# Patient Record
Sex: Female | Born: 1950 | Race: White | Hispanic: No | Smoking: Current every day smoker
Health system: Southern US, Community
[De-identification: ages and names within clinical notes are randomized; demographics above are authoritative.]

## PROBLEM LIST (undated history)

## (undated) DIAGNOSIS — Z8541 Personal history of malignant neoplasm of cervix uteri: Secondary | ICD-10-CM

## (undated) DIAGNOSIS — E785 Hyperlipidemia, unspecified: Secondary | ICD-10-CM

## (undated) DIAGNOSIS — F329 Major depressive disorder, single episode, unspecified: Secondary | ICD-10-CM

## (undated) DIAGNOSIS — I350 Nonrheumatic aortic (valve) stenosis: Secondary | ICD-10-CM

## (undated) DIAGNOSIS — M1990 Unspecified osteoarthritis, unspecified site: Secondary | ICD-10-CM

## (undated) DIAGNOSIS — I4891 Unspecified atrial fibrillation: Secondary | ICD-10-CM

## (undated) DIAGNOSIS — J439 Emphysema, unspecified: Secondary | ICD-10-CM

## (undated) DIAGNOSIS — E039 Hypothyroidism, unspecified: Secondary | ICD-10-CM

## (undated) DIAGNOSIS — I10 Essential (primary) hypertension: Secondary | ICD-10-CM

## (undated) DIAGNOSIS — Z720 Tobacco use: Secondary | ICD-10-CM

## (undated) DIAGNOSIS — E079 Disorder of thyroid, unspecified: Secondary | ICD-10-CM

## (undated) DIAGNOSIS — F1910 Other psychoactive substance abuse, uncomplicated: Secondary | ICD-10-CM

## (undated) DIAGNOSIS — I639 Cerebral infarction, unspecified: Secondary | ICD-10-CM

## (undated) DIAGNOSIS — R011 Cardiac murmur, unspecified: Secondary | ICD-10-CM

## (undated) DIAGNOSIS — F32A Depression, unspecified: Secondary | ICD-10-CM

## (undated) DIAGNOSIS — H269 Unspecified cataract: Secondary | ICD-10-CM

## (undated) DIAGNOSIS — T7840XA Allergy, unspecified, initial encounter: Secondary | ICD-10-CM

## (undated) DIAGNOSIS — F419 Anxiety disorder, unspecified: Secondary | ICD-10-CM

## (undated) DIAGNOSIS — J449 Chronic obstructive pulmonary disease, unspecified: Secondary | ICD-10-CM

## (undated) DIAGNOSIS — R569 Unspecified convulsions: Secondary | ICD-10-CM

## (undated) HISTORY — DX: Unspecified convulsions: R56.9

## (undated) HISTORY — DX: Essential (primary) hypertension: I10

## (undated) HISTORY — DX: Nonrheumatic aortic (valve) stenosis: I35.0

## (undated) HISTORY — DX: Major depressive disorder, single episode, unspecified: F32.9

## (undated) HISTORY — PX: CARDIAC VALVE REPLACEMENT: SHX585

## (undated) HISTORY — PX: ABDOMINAL HYSTERECTOMY: SHX81

## (undated) HISTORY — DX: Cerebral infarction, unspecified: I63.9

## (undated) HISTORY — DX: Depression, unspecified: F32.A

## (undated) HISTORY — DX: Emphysema, unspecified: J43.9

## (undated) HISTORY — DX: Cardiac murmur, unspecified: R01.1

## (undated) HISTORY — DX: Tobacco use: Z72.0

## (undated) HISTORY — DX: Unspecified osteoarthritis, unspecified site: M19.90

## (undated) HISTORY — PX: TONSILLECTOMY: SUR1361

## (undated) HISTORY — DX: Allergy, unspecified, initial encounter: T78.40XA

## (undated) HISTORY — PX: BREAST ENHANCEMENT SURGERY: SHX7

## (undated) HISTORY — PX: AORTIC VALVE REPLACEMENT: SHX41

## (undated) HISTORY — PX: VAGINAL HYSTERECTOMY: SUR661

## (undated) HISTORY — DX: Hyperlipidemia, unspecified: E78.5

## (undated) HISTORY — DX: Unspecified cataract: H26.9

## (undated) HISTORY — DX: Other psychoactive substance abuse, uncomplicated: F19.10

## (undated) HISTORY — PX: APPENDECTOMY: SHX54

## (undated) HISTORY — DX: Disorder of thyroid, unspecified: E07.9

## (undated) HISTORY — PX: BREAST SURGERY: SHX581

---

## 1977-08-29 DIAGNOSIS — C801 Malignant (primary) neoplasm, unspecified: Secondary | ICD-10-CM

## 1977-08-29 HISTORY — DX: Malignant (primary) neoplasm, unspecified: C80.1

## 2004-09-21 ENCOUNTER — Ambulatory Visit: Payer: Self-pay | Admitting: Family Medicine

## 2004-09-23 ENCOUNTER — Other Ambulatory Visit: Admission: RE | Admit: 2004-09-23 | Discharge: 2004-09-23 | Payer: Self-pay | Admitting: Dermatology

## 2004-10-28 ENCOUNTER — Ambulatory Visit: Payer: Self-pay | Admitting: Family Medicine

## 2005-02-01 ENCOUNTER — Ambulatory Visit: Payer: Self-pay | Admitting: Family Medicine

## 2006-01-30 ENCOUNTER — Ambulatory Visit (HOSPITAL_COMMUNITY): Admission: RE | Admit: 2006-01-30 | Discharge: 2006-01-30 | Payer: Self-pay | Admitting: Emergency Medicine

## 2007-03-27 ENCOUNTER — Ambulatory Visit (HOSPITAL_COMMUNITY): Admission: RE | Admit: 2007-03-27 | Discharge: 2007-03-27 | Payer: Self-pay | Admitting: Internal Medicine

## 2010-09-19 ENCOUNTER — Encounter: Payer: Self-pay | Admitting: Internal Medicine

## 2012-05-03 ENCOUNTER — Other Ambulatory Visit (HOSPITAL_COMMUNITY): Payer: Self-pay | Admitting: Nurse Practitioner

## 2012-05-03 DIAGNOSIS — Z139 Encounter for screening, unspecified: Secondary | ICD-10-CM

## 2012-05-14 ENCOUNTER — Ambulatory Visit (HOSPITAL_COMMUNITY)
Admission: RE | Admit: 2012-05-14 | Discharge: 2012-05-14 | Disposition: A | Discharge status: Home / Self Care | Payer: PRIVATE HEALTH INSURANCE | Source: Ambulatory Visit | Attending: Nurse Practitioner | Admitting: Nurse Practitioner

## 2012-05-14 DIAGNOSIS — Z139 Encounter for screening, unspecified: Secondary | ICD-10-CM

## 2012-05-14 DIAGNOSIS — Z1231 Encounter for screening mammogram for malignant neoplasm of breast: Secondary | ICD-10-CM | POA: Insufficient documentation

## 2014-02-19 DIAGNOSIS — E785 Hyperlipidemia, unspecified: Secondary | ICD-10-CM | POA: Diagnosis not present

## 2014-02-19 DIAGNOSIS — E039 Hypothyroidism, unspecified: Secondary | ICD-10-CM | POA: Diagnosis not present

## 2014-02-19 DIAGNOSIS — I10 Essential (primary) hypertension: Secondary | ICD-10-CM | POA: Diagnosis not present

## 2014-06-13 DIAGNOSIS — Z23 Encounter for immunization: Secondary | ICD-10-CM | POA: Diagnosis not present

## 2014-06-25 DIAGNOSIS — F419 Anxiety disorder, unspecified: Secondary | ICD-10-CM | POA: Diagnosis not present

## 2014-08-06 DIAGNOSIS — F341 Dysthymic disorder: Secondary | ICD-10-CM | POA: Diagnosis not present

## 2014-08-06 DIAGNOSIS — M9903 Segmental and somatic dysfunction of lumbar region: Secondary | ICD-10-CM | POA: Diagnosis not present

## 2014-08-06 DIAGNOSIS — M9905 Segmental and somatic dysfunction of pelvic region: Secondary | ICD-10-CM | POA: Diagnosis not present

## 2014-08-06 DIAGNOSIS — M9904 Segmental and somatic dysfunction of sacral region: Secondary | ICD-10-CM | POA: Diagnosis not present

## 2014-08-06 DIAGNOSIS — F630 Pathological gambling: Secondary | ICD-10-CM | POA: Diagnosis not present

## 2014-08-06 DIAGNOSIS — F411 Generalized anxiety disorder: Secondary | ICD-10-CM | POA: Diagnosis not present

## 2014-08-06 DIAGNOSIS — M5136 Other intervertebral disc degeneration, lumbar region: Secondary | ICD-10-CM | POA: Diagnosis not present

## 2014-08-15 DIAGNOSIS — F411 Generalized anxiety disorder: Secondary | ICD-10-CM | POA: Diagnosis not present

## 2014-08-15 DIAGNOSIS — F341 Dysthymic disorder: Secondary | ICD-10-CM | POA: Diagnosis not present

## 2014-08-15 DIAGNOSIS — F630 Pathological gambling: Secondary | ICD-10-CM | POA: Diagnosis not present

## 2014-08-20 DIAGNOSIS — F411 Generalized anxiety disorder: Secondary | ICD-10-CM | POA: Diagnosis not present

## 2014-08-20 DIAGNOSIS — F630 Pathological gambling: Secondary | ICD-10-CM | POA: Diagnosis not present

## 2014-08-20 DIAGNOSIS — F341 Dysthymic disorder: Secondary | ICD-10-CM | POA: Diagnosis not present

## 2014-09-01 DIAGNOSIS — M9904 Segmental and somatic dysfunction of sacral region: Secondary | ICD-10-CM | POA: Diagnosis not present

## 2014-09-01 DIAGNOSIS — F411 Generalized anxiety disorder: Secondary | ICD-10-CM | POA: Diagnosis not present

## 2014-09-01 DIAGNOSIS — F630 Pathological gambling: Secondary | ICD-10-CM | POA: Diagnosis not present

## 2014-09-01 DIAGNOSIS — M9903 Segmental and somatic dysfunction of lumbar region: Secondary | ICD-10-CM | POA: Diagnosis not present

## 2014-09-01 DIAGNOSIS — F341 Dysthymic disorder: Secondary | ICD-10-CM | POA: Diagnosis not present

## 2014-09-01 DIAGNOSIS — M9905 Segmental and somatic dysfunction of pelvic region: Secondary | ICD-10-CM | POA: Diagnosis not present

## 2014-09-01 DIAGNOSIS — M5136 Other intervertebral disc degeneration, lumbar region: Secondary | ICD-10-CM | POA: Diagnosis not present

## 2014-09-09 DIAGNOSIS — F630 Pathological gambling: Secondary | ICD-10-CM | POA: Diagnosis not present

## 2014-09-09 DIAGNOSIS — F341 Dysthymic disorder: Secondary | ICD-10-CM | POA: Diagnosis not present

## 2014-09-09 DIAGNOSIS — F411 Generalized anxiety disorder: Secondary | ICD-10-CM | POA: Diagnosis not present

## 2014-09-23 DIAGNOSIS — F630 Pathological gambling: Secondary | ICD-10-CM | POA: Diagnosis not present

## 2014-09-23 DIAGNOSIS — F411 Generalized anxiety disorder: Secondary | ICD-10-CM | POA: Diagnosis not present

## 2014-09-23 DIAGNOSIS — F341 Dysthymic disorder: Secondary | ICD-10-CM | POA: Diagnosis not present

## 2014-10-09 DIAGNOSIS — M9905 Segmental and somatic dysfunction of pelvic region: Secondary | ICD-10-CM | POA: Diagnosis not present

## 2014-10-09 DIAGNOSIS — M5136 Other intervertebral disc degeneration, lumbar region: Secondary | ICD-10-CM | POA: Diagnosis not present

## 2014-10-09 DIAGNOSIS — M9904 Segmental and somatic dysfunction of sacral region: Secondary | ICD-10-CM | POA: Diagnosis not present

## 2014-10-09 DIAGNOSIS — M9903 Segmental and somatic dysfunction of lumbar region: Secondary | ICD-10-CM | POA: Diagnosis not present

## 2014-10-21 DIAGNOSIS — F630 Pathological gambling: Secondary | ICD-10-CM | POA: Diagnosis not present

## 2014-10-21 DIAGNOSIS — F341 Dysthymic disorder: Secondary | ICD-10-CM | POA: Diagnosis not present

## 2014-10-21 DIAGNOSIS — F411 Generalized anxiety disorder: Secondary | ICD-10-CM | POA: Diagnosis not present

## 2014-12-10 DIAGNOSIS — F17200 Nicotine dependence, unspecified, uncomplicated: Secondary | ICD-10-CM | POA: Diagnosis not present

## 2014-12-10 DIAGNOSIS — E039 Hypothyroidism, unspecified: Secondary | ICD-10-CM | POA: Diagnosis not present

## 2014-12-10 DIAGNOSIS — Z049 Encounter for examination and observation for unspecified reason: Secondary | ICD-10-CM | POA: Diagnosis not present

## 2014-12-10 DIAGNOSIS — I10 Essential (primary) hypertension: Secondary | ICD-10-CM | POA: Diagnosis not present

## 2014-12-10 DIAGNOSIS — E785 Hyperlipidemia, unspecified: Secondary | ICD-10-CM | POA: Diagnosis not present

## 2015-01-19 DIAGNOSIS — I973 Postprocedural hypertension: Secondary | ICD-10-CM | POA: Diagnosis not present

## 2015-01-19 DIAGNOSIS — M5137 Other intervertebral disc degeneration, lumbosacral region: Secondary | ICD-10-CM | POA: Diagnosis not present

## 2015-01-19 DIAGNOSIS — M9902 Segmental and somatic dysfunction of thoracic region: Secondary | ICD-10-CM | POA: Diagnosis not present

## 2015-01-19 DIAGNOSIS — M9901 Segmental and somatic dysfunction of cervical region: Secondary | ICD-10-CM | POA: Diagnosis not present

## 2015-01-19 DIAGNOSIS — M9903 Segmental and somatic dysfunction of lumbar region: Secondary | ICD-10-CM | POA: Diagnosis not present

## 2015-01-19 DIAGNOSIS — Z87891 Personal history of nicotine dependence: Secondary | ICD-10-CM | POA: Diagnosis not present

## 2015-01-21 DIAGNOSIS — M5137 Other intervertebral disc degeneration, lumbosacral region: Secondary | ICD-10-CM | POA: Diagnosis not present

## 2015-01-21 DIAGNOSIS — M9902 Segmental and somatic dysfunction of thoracic region: Secondary | ICD-10-CM | POA: Diagnosis not present

## 2015-01-21 DIAGNOSIS — M9901 Segmental and somatic dysfunction of cervical region: Secondary | ICD-10-CM | POA: Diagnosis not present

## 2015-01-21 DIAGNOSIS — Z87891 Personal history of nicotine dependence: Secondary | ICD-10-CM | POA: Diagnosis not present

## 2015-01-21 DIAGNOSIS — M9903 Segmental and somatic dysfunction of lumbar region: Secondary | ICD-10-CM | POA: Diagnosis not present

## 2015-01-21 DIAGNOSIS — I973 Postprocedural hypertension: Secondary | ICD-10-CM | POA: Diagnosis not present

## 2015-01-28 DIAGNOSIS — M9902 Segmental and somatic dysfunction of thoracic region: Secondary | ICD-10-CM | POA: Diagnosis not present

## 2015-01-28 DIAGNOSIS — M9901 Segmental and somatic dysfunction of cervical region: Secondary | ICD-10-CM | POA: Diagnosis not present

## 2015-01-28 DIAGNOSIS — M5137 Other intervertebral disc degeneration, lumbosacral region: Secondary | ICD-10-CM | POA: Diagnosis not present

## 2015-01-28 DIAGNOSIS — Z87891 Personal history of nicotine dependence: Secondary | ICD-10-CM | POA: Diagnosis not present

## 2015-01-28 DIAGNOSIS — M9903 Segmental and somatic dysfunction of lumbar region: Secondary | ICD-10-CM | POA: Diagnosis not present

## 2015-01-28 DIAGNOSIS — I973 Postprocedural hypertension: Secondary | ICD-10-CM | POA: Diagnosis not present

## 2015-02-04 DIAGNOSIS — M5137 Other intervertebral disc degeneration, lumbosacral region: Secondary | ICD-10-CM | POA: Diagnosis not present

## 2015-02-04 DIAGNOSIS — M9901 Segmental and somatic dysfunction of cervical region: Secondary | ICD-10-CM | POA: Diagnosis not present

## 2015-02-04 DIAGNOSIS — I973 Postprocedural hypertension: Secondary | ICD-10-CM | POA: Diagnosis not present

## 2015-02-04 DIAGNOSIS — Z87891 Personal history of nicotine dependence: Secondary | ICD-10-CM | POA: Diagnosis not present

## 2015-02-04 DIAGNOSIS — M9903 Segmental and somatic dysfunction of lumbar region: Secondary | ICD-10-CM | POA: Diagnosis not present

## 2015-02-04 DIAGNOSIS — M9902 Segmental and somatic dysfunction of thoracic region: Secondary | ICD-10-CM | POA: Diagnosis not present

## 2015-03-25 DIAGNOSIS — I973 Postprocedural hypertension: Secondary | ICD-10-CM | POA: Diagnosis not present

## 2015-03-25 DIAGNOSIS — M5137 Other intervertebral disc degeneration, lumbosacral region: Secondary | ICD-10-CM | POA: Diagnosis not present

## 2015-03-25 DIAGNOSIS — M9903 Segmental and somatic dysfunction of lumbar region: Secondary | ICD-10-CM | POA: Diagnosis not present

## 2015-03-25 DIAGNOSIS — Z87891 Personal history of nicotine dependence: Secondary | ICD-10-CM | POA: Diagnosis not present

## 2015-03-25 DIAGNOSIS — M9901 Segmental and somatic dysfunction of cervical region: Secondary | ICD-10-CM | POA: Diagnosis not present

## 2015-03-25 DIAGNOSIS — M9902 Segmental and somatic dysfunction of thoracic region: Secondary | ICD-10-CM | POA: Diagnosis not present

## 2015-04-23 DIAGNOSIS — H40033 Anatomical narrow angle, bilateral: Secondary | ICD-10-CM | POA: Diagnosis not present

## 2015-04-23 DIAGNOSIS — H2513 Age-related nuclear cataract, bilateral: Secondary | ICD-10-CM | POA: Diagnosis not present

## 2015-06-10 DIAGNOSIS — Z87891 Personal history of nicotine dependence: Secondary | ICD-10-CM | POA: Diagnosis not present

## 2015-06-10 DIAGNOSIS — M9904 Segmental and somatic dysfunction of sacral region: Secondary | ICD-10-CM | POA: Diagnosis not present

## 2015-06-10 DIAGNOSIS — M5430 Sciatica, unspecified side: Secondary | ICD-10-CM | POA: Diagnosis not present

## 2015-06-10 DIAGNOSIS — M9903 Segmental and somatic dysfunction of lumbar region: Secondary | ICD-10-CM | POA: Diagnosis not present

## 2015-06-10 DIAGNOSIS — M9902 Segmental and somatic dysfunction of thoracic region: Secondary | ICD-10-CM | POA: Diagnosis not present

## 2015-06-10 DIAGNOSIS — I973 Postprocedural hypertension: Secondary | ICD-10-CM | POA: Diagnosis not present

## 2015-06-11 DIAGNOSIS — M5430 Sciatica, unspecified side: Secondary | ICD-10-CM | POA: Diagnosis not present

## 2015-06-11 DIAGNOSIS — M9904 Segmental and somatic dysfunction of sacral region: Secondary | ICD-10-CM | POA: Diagnosis not present

## 2015-06-11 DIAGNOSIS — M9902 Segmental and somatic dysfunction of thoracic region: Secondary | ICD-10-CM | POA: Diagnosis not present

## 2015-06-11 DIAGNOSIS — Z87891 Personal history of nicotine dependence: Secondary | ICD-10-CM | POA: Diagnosis not present

## 2015-06-11 DIAGNOSIS — M9903 Segmental and somatic dysfunction of lumbar region: Secondary | ICD-10-CM | POA: Diagnosis not present

## 2015-06-11 DIAGNOSIS — I973 Postprocedural hypertension: Secondary | ICD-10-CM | POA: Diagnosis not present

## 2015-06-17 DIAGNOSIS — Z23 Encounter for immunization: Secondary | ICD-10-CM | POA: Diagnosis not present

## 2015-06-22 DIAGNOSIS — M9903 Segmental and somatic dysfunction of lumbar region: Secondary | ICD-10-CM | POA: Diagnosis not present

## 2015-06-22 DIAGNOSIS — I973 Postprocedural hypertension: Secondary | ICD-10-CM | POA: Diagnosis not present

## 2015-06-22 DIAGNOSIS — M9902 Segmental and somatic dysfunction of thoracic region: Secondary | ICD-10-CM | POA: Diagnosis not present

## 2015-06-22 DIAGNOSIS — M9904 Segmental and somatic dysfunction of sacral region: Secondary | ICD-10-CM | POA: Diagnosis not present

## 2015-06-22 DIAGNOSIS — M5430 Sciatica, unspecified side: Secondary | ICD-10-CM | POA: Diagnosis not present

## 2015-06-22 DIAGNOSIS — Z87891 Personal history of nicotine dependence: Secondary | ICD-10-CM | POA: Diagnosis not present

## 2015-07-08 DIAGNOSIS — I973 Postprocedural hypertension: Secondary | ICD-10-CM | POA: Diagnosis not present

## 2015-07-08 DIAGNOSIS — M9902 Segmental and somatic dysfunction of thoracic region: Secondary | ICD-10-CM | POA: Diagnosis not present

## 2015-07-08 DIAGNOSIS — M9903 Segmental and somatic dysfunction of lumbar region: Secondary | ICD-10-CM | POA: Diagnosis not present

## 2015-07-08 DIAGNOSIS — Z87891 Personal history of nicotine dependence: Secondary | ICD-10-CM | POA: Diagnosis not present

## 2015-07-08 DIAGNOSIS — M9904 Segmental and somatic dysfunction of sacral region: Secondary | ICD-10-CM | POA: Diagnosis not present

## 2015-07-08 DIAGNOSIS — M5430 Sciatica, unspecified side: Secondary | ICD-10-CM | POA: Diagnosis not present

## 2015-08-10 DIAGNOSIS — Z1283 Encounter for screening for malignant neoplasm of skin: Secondary | ICD-10-CM | POA: Diagnosis not present

## 2015-08-19 DIAGNOSIS — M5430 Sciatica, unspecified side: Secondary | ICD-10-CM | POA: Diagnosis not present

## 2015-08-19 DIAGNOSIS — Z87891 Personal history of nicotine dependence: Secondary | ICD-10-CM | POA: Diagnosis not present

## 2015-08-19 DIAGNOSIS — M9902 Segmental and somatic dysfunction of thoracic region: Secondary | ICD-10-CM | POA: Diagnosis not present

## 2015-08-19 DIAGNOSIS — I973 Postprocedural hypertension: Secondary | ICD-10-CM | POA: Diagnosis not present

## 2015-08-19 DIAGNOSIS — M9904 Segmental and somatic dysfunction of sacral region: Secondary | ICD-10-CM | POA: Diagnosis not present

## 2015-08-19 DIAGNOSIS — M9903 Segmental and somatic dysfunction of lumbar region: Secondary | ICD-10-CM | POA: Diagnosis not present

## 2015-10-26 DIAGNOSIS — I973 Postprocedural hypertension: Secondary | ICD-10-CM | POA: Diagnosis not present

## 2015-10-26 DIAGNOSIS — M5137 Other intervertebral disc degeneration, lumbosacral region: Secondary | ICD-10-CM | POA: Diagnosis not present

## 2015-10-26 DIAGNOSIS — M9902 Segmental and somatic dysfunction of thoracic region: Secondary | ICD-10-CM | POA: Diagnosis not present

## 2015-10-26 DIAGNOSIS — Z87891 Personal history of nicotine dependence: Secondary | ICD-10-CM | POA: Diagnosis not present

## 2015-10-26 DIAGNOSIS — M9903 Segmental and somatic dysfunction of lumbar region: Secondary | ICD-10-CM | POA: Diagnosis not present

## 2015-10-26 DIAGNOSIS — M9901 Segmental and somatic dysfunction of cervical region: Secondary | ICD-10-CM | POA: Diagnosis not present

## 2015-11-20 ENCOUNTER — Encounter: Payer: Self-pay | Admitting: Family Medicine

## 2015-11-20 ENCOUNTER — Ambulatory Visit (INDEPENDENT_AMBULATORY_CARE_PROVIDER_SITE_OTHER): Payer: Medicare Other | Admitting: Family Medicine

## 2015-11-20 VITALS — BP 110/72 | HR 53 | Temp 97.1°F | Ht 63.5 in | Wt 128.0 lb

## 2015-11-20 DIAGNOSIS — E785 Hyperlipidemia, unspecified: Secondary | ICD-10-CM | POA: Insufficient documentation

## 2015-11-20 DIAGNOSIS — E039 Hypothyroidism, unspecified: Secondary | ICD-10-CM | POA: Diagnosis not present

## 2015-11-20 DIAGNOSIS — I10 Essential (primary) hypertension: Secondary | ICD-10-CM

## 2015-11-20 DIAGNOSIS — F411 Generalized anxiety disorder: Secondary | ICD-10-CM | POA: Diagnosis not present

## 2015-11-20 DIAGNOSIS — R011 Cardiac murmur, unspecified: Secondary | ICD-10-CM | POA: Insufficient documentation

## 2015-11-20 MED ORDER — BISOPROLOL-HYDROCHLOROTHIAZIDE 10-6.25 MG PO TABS: 1.0000 | ORAL_TABLET | Freq: Every day | ORAL | Status: DC

## 2015-11-20 MED ORDER — LEVOTHYROXINE SODIUM 75 MCG PO TABS: 75.0000 ug | ORAL_TABLET | Freq: Every day | ORAL | Status: DC

## 2015-11-20 MED ORDER — LOVASTATIN 20 MG PO TABS: 20.0000 mg | ORAL_TABLET | Freq: Every day | ORAL | Status: DC

## 2015-11-20 MED ORDER — ESCITALOPRAM OXALATE 10 MG PO TABS: 10.0000 mg | ORAL_TABLET | Freq: Every day | ORAL | Status: DC

## 2015-11-20 NOTE — Progress Notes (Signed)
BP 110/72 mmHg  Pulse 53  Temp(Src) 97.1 F (36.2 C) (Oral)  Ht 5' 3.5" (1.613 m)  Wt 128 lb (58.06 kg)  BMI 22.32 kg/m2   Subjective:    Patient ID: Tammie Martin, female    DOB: [birth year], 65 y.o.   MRN: [id number]  HPI: Tammie Martin is a 65 y.o. female presenting on 11/20/2015 for Establish Care   HPI Hypertension Patient is coming to establish with our office as a new patient. She is coming in today for hypertension check. She is currently on Ziac and her blood pressure today is 110/72.Patient denies headaches, blurred vision, chest pains, shortness of breath, or weakness. Denies any side effects from medication and is content with current medication.   Hyperlipidemia  Patient is currently on Mevacor and fish oils for cholesterol. She is due for a check today and came in fasting. She denies any issues with her medication.  Hypothyroidism Patient is on Synthroid 75 g. She denies any issues with the medication. She denies any fatigue or hair changes or weight changes or constipation or diarrhea.She denies any palpitations or chest pain. Has been a while since she's had a check.  Anxiety Patient has been having anxiety and some panic attacks occasionally that are minor panic attacks per her. She denies any depressions or feelings of sadness. She has been having some sleep issues with her anxiety waking up at night. She would like to discuss things that could per with anxiety. She does not have a counselor currently and discussed that as a possibility. She denies any suicidal ideations or thoughts of hurting Herself.  Relevant past medical, surgical, family and social history reviewed and updated as indicated. Interim medical history since our last visit reviewed. Allergies and medications reviewed and updated.  Review of Systems  Constitutional: Negative for fever and chills.  HENT: Negative for congestion, ear discharge and ear pain.   Eyes: Negative for redness and  visual disturbance.  Respiratory: Negative for chest tightness and shortness of breath.   Cardiovascular: Negative for chest pain and leg swelling.  Genitourinary: Negative for dysuria and difficulty urinating.  Musculoskeletal: Negative for back pain and gait problem.  Skin: Negative for rash.  Neurological: Negative for dizziness, light-headedness and headaches.  Psychiatric/Behavioral: Positive for sleep disturbance. Negative for suicidal ideas, behavioral problems, self-injury, dysphoric mood and agitation. The patient is nervous/anxious.   All other systems reviewed and are negative.   Per HPI unless specifically indicated above  Social History   Social History  . Marital Status: Widowed    Spouse Name: N/A  . Number of Children: N/A  . Years of Education: N/A   Occupational History  . Not on file.   Social History Main Topics  . Smoking status: Current Every Day Smoker -- 1.00 packs/day for 40 years    Types: Cigarettes  . Smokeless tobacco: Never Used  . Alcohol Use: No  . Drug Use: No  . Sexual Activity: No   Other Topics Concern  . Not on file   Social History Narrative  . No narrative on file    Past Surgical History  Procedure Laterality Date  . Abdominal hysterectomy    . Appendectomy    . Tonsillectomy    . Breast enhancement surgery      Family History  Problem Relation Age of Onset  . Cancer Mother     breast cancer at 53   . Cancer Maternal Aunt     colon cancer  Medication List       This list is accurate as of: 11/20/15 12:40 PM.  Always use your most recent med list.               bisoprolol-hydrochlorothiazide 10-6.25 MG tablet  Commonly known as:  ZIAC  Take 1 tablet by mouth daily.     calcium gluconate 500 MG tablet  Take 1 tablet by mouth 3 (three) times daily.     cholecalciferol 1000 units tablet  Commonly known as:  VITAMIN D  Take 2,000 Units by mouth 2 (two) times daily.     escitalopram 10 MG tablet    Commonly known as:  LEXAPRO  Take 1 tablet (10 mg total) by mouth daily.     Fish Oil 1000 MG Caps  Take 2 capsules by mouth daily.     Flax Seed Oil 1000 MG Caps  Take 2,000 mg by mouth daily.     Glucosamine 500 MG Caps  Take 2 capsules by mouth 2 (two) times daily.     levothyroxine 75 MCG tablet  Commonly known as:  SYNTHROID, LEVOTHROID  Take 1 tablet (75 mcg total) by mouth daily before breakfast.     lovastatin 20 MG tablet  Commonly known as:  MEVACOR  Take 1 tablet (20 mg total) by mouth at bedtime.     MULTIVITAMIN ADULT PO  Take 1 capsule by mouth daily.           Objective:    BP 110/72 mmHg  Pulse 53  Temp(Src) 97.1 F (36.2 C) (Oral)  Ht 5' 3.5" (1.613 m)  Wt 128 lb (58.06 kg)  BMI 22.32 kg/m2  Wt Readings from Last 3 Encounters:  11/20/15 128 lb (58.06 kg)    Physical Exam  Constitutional: She is oriented to person, place, and time. She appears well-developed and well-nourished. No distress.  Eyes: Conjunctivae and EOM are normal. Pupils are equal, round, and reactive to light.  Cardiovascular: Normal rate, regular rhythm and intact distal pulses.   Murmur (Systolic murmur 3 out of 6, crescendo decrescendo. Patient did not know she had this previously.) heard. Pulmonary/Chest: Effort normal and breath sounds normal. No respiratory distress. She has no wheezes.  Musculoskeletal: Normal range of motion. She exhibits no edema or tenderness.  Neurological: She is alert and oriented to person, place, and time. Coordination normal.  Skin: Skin is warm and dry. No rash noted. She is not diaphoretic.  Psychiatric: Her behavior is normal. Judgment and thought content normal. Her mood appears anxious. Her affect is not blunt and not labile. She expresses no suicidal ideation. She expresses no suicidal plans.  Nursing note and vitals reviewed.   No results found for this or any previous visit.    Assessment & Plan:   Problem List Items Addressed This  Visit      Cardiovascular and Mediastinum   Essential hypertension, benign - Primary   Relevant Medications   lovastatin (MEVACOR) 20 MG tablet   bisoprolol-hydrochlorothiazide (ZIAC) 10-6.25 MG tablet   Other Relevant Orders   CMP14+EGFR (Completed)     Endocrine   Hypothyroidism   Relevant Medications   levothyroxine (SYNTHROID, LEVOTHROID) 75 MCG tablet   Other Relevant Orders   TSH (Completed)     Other   Hyperlipidemia LDL goal <130   Relevant Medications   lovastatin (MEVACOR) 20 MG tablet   bisoprolol-hydrochlorothiazide (ZIAC) 10-6.25 MG tablet   Other Relevant Orders   Lipid panel (Completed)   Generalized anxiety disorder  Relevant Medications   escitalopram (LEXAPRO) 10 MG tablet   Systolic murmur   Relevant Orders   Echocardiogram       Follow up plan: Return in about 4 weeks (around 12/18/2015), or if symptoms worsen or fail to improve, for f/u anxiety.  Caryl Pina, MD [facility] 11/20/2015, 12:40 PM

## 2015-11-20 NOTE — Patient Instructions (Signed)
Thank you for allowing us to care for you today. We strive to provide exceptional quality and compassionate care. Please let us know how we are doing and how we can help serve you better by filling out the survey that you receive from [employer].     

## 2015-11-21 LAB — CMP14+EGFR
A/G RATIO: 1.5 (ref 1.2–2.2)
ALT: 13 IU/L (ref 0–32)
AST: 18 IU/L (ref 0–40)
Albumin: 4.3 g/dL (ref 3.6–4.8)
Alkaline Phosphatase: 90 IU/L (ref 39–117)
BILIRUBIN TOTAL: 0.3 mg/dL (ref 0.0–1.2)
BUN/Creatinine Ratio: 18 (ref 11–26)
BUN: 12 mg/dL (ref 8–27)
CALCIUM: 9.5 mg/dL (ref 8.7–10.3)
CHLORIDE: 96 mmol/L (ref 96–106)
CO2: 27 mmol/L (ref 18–29)
Creatinine, Ser: 0.68 mg/dL (ref 0.57–1.00)
GFR, EST AFRICAN AMERICAN: 105 mL/min/{1.73_m2} (ref >59)
GFR, EST NON AFRICAN AMERICAN: 91 mL/min/{1.73_m2} (ref >59)
GLOBULIN, TOTAL: 2.9 g/dL (ref 1.5–4.5)
Glucose: 91 mg/dL (ref 65–99)
POTASSIUM: 4.5 mmol/L (ref 3.5–5.2)
SODIUM: 138 mmol/L (ref 134–144)
TOTAL PROTEIN: 7.2 g/dL (ref 6.0–8.5)

## 2015-11-21 LAB — LIPID PANEL
CHOL/HDL RATIO: 3.5 ratio (ref 0.0–4.4)
Cholesterol, Total: 176 mg/dL (ref 100–199)
HDL: 51 mg/dL (ref >39)
LDL Calculated: 106 mg/dL — ABNORMAL HIGH (ref 0–99)
Triglycerides: 94 mg/dL (ref 0–149)
VLDL Cholesterol Cal: 19 mg/dL (ref 5–40)

## 2015-11-21 LAB — TSH: TSH: 2.07 u[IU]/mL (ref 0.450–4.500)

## 2015-12-02 DIAGNOSIS — M9903 Segmental and somatic dysfunction of lumbar region: Secondary | ICD-10-CM | POA: Diagnosis not present

## 2015-12-02 DIAGNOSIS — M9901 Segmental and somatic dysfunction of cervical region: Secondary | ICD-10-CM | POA: Diagnosis not present

## 2015-12-02 DIAGNOSIS — I973 Postprocedural hypertension: Secondary | ICD-10-CM | POA: Diagnosis not present

## 2015-12-02 DIAGNOSIS — Z87891 Personal history of nicotine dependence: Secondary | ICD-10-CM | POA: Diagnosis not present

## 2015-12-02 DIAGNOSIS — M5137 Other intervertebral disc degeneration, lumbosacral region: Secondary | ICD-10-CM | POA: Diagnosis not present

## 2015-12-02 DIAGNOSIS — M9902 Segmental and somatic dysfunction of thoracic region: Secondary | ICD-10-CM | POA: Diagnosis not present

## 2015-12-16 ENCOUNTER — Other Ambulatory Visit (HOSPITAL_COMMUNITY): Payer: Self-pay

## 2015-12-17 ENCOUNTER — Encounter: Payer: Self-pay | Admitting: Family Medicine

## 2015-12-17 ENCOUNTER — Ambulatory Visit (INDEPENDENT_AMBULATORY_CARE_PROVIDER_SITE_OTHER): Payer: Medicare Other | Admitting: Family Medicine

## 2015-12-17 ENCOUNTER — Ambulatory Visit (HOSPITAL_COMMUNITY)
Admission: RE | Admit: 2015-12-17 | Discharge: 2015-12-17 | Disposition: A | Discharge status: Home / Self Care | Payer: Medicare Other | Source: Ambulatory Visit | Attending: Family Medicine | Admitting: Family Medicine

## 2015-12-17 VITALS — BP 94/59 | HR 60 | Temp 97.2°F | Ht 63.5 in | Wt 127.6 lb

## 2015-12-17 DIAGNOSIS — L739 Follicular disorder, unspecified: Secondary | ICD-10-CM

## 2015-12-17 DIAGNOSIS — I119 Hypertensive heart disease without heart failure: Secondary | ICD-10-CM | POA: Diagnosis not present

## 2015-12-17 DIAGNOSIS — I340 Nonrheumatic mitral (valve) insufficiency: Secondary | ICD-10-CM | POA: Diagnosis not present

## 2015-12-17 DIAGNOSIS — E785 Hyperlipidemia, unspecified: Secondary | ICD-10-CM | POA: Diagnosis not present

## 2015-12-17 DIAGNOSIS — F411 Generalized anxiety disorder: Secondary | ICD-10-CM

## 2015-12-17 DIAGNOSIS — Z720 Tobacco use: Secondary | ICD-10-CM | POA: Diagnosis not present

## 2015-12-17 DIAGNOSIS — I352 Nonrheumatic aortic (valve) stenosis with insufficiency: Secondary | ICD-10-CM | POA: Insufficient documentation

## 2015-12-17 DIAGNOSIS — R011 Cardiac murmur, unspecified: Secondary | ICD-10-CM | POA: Insufficient documentation

## 2015-12-17 MED ORDER — ESCITALOPRAM OXALATE 10 MG PO TABS: 10.0000 mg | ORAL_TABLET | Freq: Every day | ORAL | Status: DC

## 2015-12-17 MED ORDER — DOXYCYCLINE MONOHYDRATE 100 MG PO TABS: 100.0000 mg | ORAL_TABLET | Freq: Two times a day (BID) | ORAL | Status: DC

## 2015-12-17 NOTE — Progress Notes (Signed)
BP 94/59 mmHg  Pulse 60  Temp(Src) 97.2 F (36.2 C) (Oral)  Ht 5' 3.5" (1.613 m)  Wt 127 lb 9.6 oz (57.879 kg)  BMI 22.25 kg/m2   Subjective:    Patient ID: Tammie Martin, female    DOB: [birth year], 65 y.o.   MRN: [id number]  HPI: Tammie Martin is a 65 y.o. female presenting on 12/17/2015 for Anxiety and Spots around navel   HPI Rash Patient has 2 small spots on the skin near her umbilicus that we'll get red and inflamed and look like a large pimple and then will go down. Currently they are down and urges to small pink spots. She is been having this recurrently for the past 3 or 4 months since she had a tick bite near that region. She denies any fevers or chills or shortness of breath or wheezing. There is no purulent drainage from the site. When they get bigger and inflamed they are very pruritic but not a little painful.  Anxiety recheck Patient was started on Lexapro 10 mg and feels like she is doing very well on it. She does feel like it has reduced the amount of time she wakes up with anxiety and she has been feeling a lot better about that.she has been sleeping well at night and denies any suicidal ideation or thoughts of hurting herself.  Hypertension recheck Patient is also coming in for a hypertension recheck today. She is currently on a very low-dose of her medications. She has a blood pressure today that is 94/59. Patient denies headaches, blurred vision, chest pains, shortness of breath, or weakness. Denies any side effects from medication and is content with current medication. Because she is not having any dizziness she would like to stay on this current dose of blood pressure medication.   Relevant past medical, surgical, family and social history reviewed and updated as indicated. Interim medical history since our last visit reviewed. Allergies and medications reviewed and updated.  Review of Systems  Constitutional: Negative for fever and chills.  HENT:  Negative for congestion, ear discharge and ear pain.   Eyes: Negative for redness and visual disturbance.  Respiratory: Negative for chest tightness and shortness of breath.   Cardiovascular: Negative for chest pain and leg swelling.  Genitourinary: Negative for dysuria and difficulty urinating.  Musculoskeletal: Negative for back pain and gait problem.  Skin: Negative for rash.  Neurological: Negative for dizziness, light-headedness and headaches.  Psychiatric/Behavioral: Negative for suicidal ideas, hallucinations, behavioral problems, sleep disturbance, self-injury, dysphoric mood and agitation. The patient is not nervous/anxious.   All other systems reviewed and are negative.   Per HPI unless specifically indicated above     Medication List       This list is accurate as of: 12/17/15  2:00 PM.  Always use your most recent med list.               bisoprolol-hydrochlorothiazide 10-6.25 MG tablet  Commonly known as:  ZIAC  Take 1 tablet by mouth daily.     calcium gluconate 500 MG tablet  Take 1 tablet by mouth 3 (three) times daily.     cetirizine 10 MG tablet  Commonly known as:  ZYRTEC  Take 10 mg by mouth daily.     cholecalciferol 1000 units tablet  Commonly known as:  VITAMIN D  Take 2,000 Units by mouth 2 (two) times daily.     doxycycline 100 MG tablet  Commonly known as:  ADOXA  Take 1 tablet (100 mg total) by mouth 2 (two) times daily.     escitalopram 10 MG tablet  Commonly known as:  LEXAPRO  Take 1 tablet (10 mg total) by mouth daily.     Fish Oil 1000 MG Caps  Take 2 capsules by mouth daily.     Flax Seed Oil 1000 MG Caps  Take 2,000 mg by mouth daily.     Glucosamine 500 MG Caps  Take 2 capsules by mouth 2 (two) times daily.     levothyroxine 75 MCG tablet  Commonly known as:  SYNTHROID, LEVOTHROID  Take 1 tablet (75 mcg total) by mouth daily before breakfast.     lovastatin 20 MG tablet  Commonly known as:  MEVACOR  Take 1 tablet (20 mg  total) by mouth at bedtime.     MULTIVITAMIN ADULT PO  Take 1 capsule by mouth daily.           Objective:    BP 94/59 mmHg  Pulse 60  Temp(Src) 97.2 F (36.2 C) (Oral)  Ht 5' 3.5" (1.613 m)  Wt 127 lb 9.6 oz (57.879 kg)  BMI 22.25 kg/m2  Wt Readings from Last 3 Encounters:  12/17/15 127 lb 9.6 oz (57.879 kg)  11/20/15 128 lb (58.06 kg)    Physical Exam  Constitutional: She is oriented to person, place, and time. She appears well-developed and well-nourished. No distress.  Eyes: Conjunctivae and EOM are normal. Pupils are equal, round, and reactive to light.  Neck: Neck supple. No thyromegaly present.  Cardiovascular: Normal rate, regular rhythm, normal heart sounds and intact distal pulses.   No murmur heard. Pulmonary/Chest: Effort normal and breath sounds normal. No respiratory distress. She has no wheezes.  Musculoskeletal: Normal range of motion. She exhibits no edema or tenderness.  Lymphadenopathy:    She has no cervical adenopathy.  Neurological: She is alert and oriented to person, place, and time. Coordination normal.  Skin: Skin is warm and dry. Rash (2 small pink papules on the right lateral aspect of her umbilicus,doubt about a half a centimeter from each other. No drainage or induration or fluctuation noted.) noted. She is not diaphoretic.  Psychiatric: She has a normal mood and affect. Her behavior is normal.  Nursing note and vitals reviewed.   Results for orders placed or performed in visit on 11/20/15  CMP14+EGFR  Result Value Ref Range   Glucose 91 65 - 99 mg/dL   BUN 12 8 - 27 mg/dL   Creatinine, Ser 0.68 0.57 - 1.00 mg/dL   GFR calc non Af Amer 91 >59 mL/min/1.73   GFR calc Af Amer 105 >59 mL/min/1.73   BUN/Creatinine Ratio 18 11 - 26   Sodium 138 134 - 144 mmol/L   Potassium 4.5 3.5 - 5.2 mmol/L   Chloride 96 96 - 106 mmol/L   CO2 27 18 - 29 mmol/L   Calcium 9.5 8.7 - 10.3 mg/dL   Total Protein 7.2 6.0 - 8.5 g/dL   Albumin 4.3 3.6 - 4.8 g/dL     Globulin, Total 2.9 1.5 - 4.5 g/dL   Albumin/Globulin Ratio 1.5 1.2 - 2.2   Bilirubin Total 0.3 0.0 - 1.2 mg/dL   Alkaline Phosphatase 90 39 - 117 IU/L   AST 18 0 - 40 IU/L   ALT 13 0 - 32 IU/L  Lipid panel  Result Value Ref Range   Cholesterol, Total 176 100 - 199 mg/dL   Triglycerides 94 0 - 149 mg/dL   HDL  51 >39 mg/dL   VLDL Cholesterol Cal 19 5 - 40 mg/dL   LDL Calculated 106 (H) 0 - 99 mg/dL   Chol/HDL Ratio 3.5 0.0 - 4.4 ratio units  TSH  Result Value Ref Range   TSH 2.070 0.450 - 4.500 uIU/mL      Assessment & Plan:   Problem List Items Addressed This Visit      Other   Generalized anxiety disorder - Primary   Relevant Medications   escitalopram (LEXAPRO) 10 MG tablet    Other Visit Diagnoses    Folliculitis        Relevant Medications    doxycycline (ADOXA) 100 MG tablet        Follow up plan: Return in about 3 months (around 03/17/2016), or if symptoms worsen or fail to improve, for Recheck anxiety.  Counseling provided for all of the vaccine components No orders of the defined types were placed in this encounter.    Caryl Pina, MD [facility] 12/17/2015, 2:00 PM

## 2015-12-17 NOTE — Progress Notes (Signed)
*  PRELIMINARY RESULTS* Echocardiogram 2D Echocardiogram has been performed.  Leavy Cella 12/17/2015, 11:15 AM

## 2015-12-18 ENCOUNTER — Ambulatory Visit: Payer: Medicare Other | Admitting: Family Medicine

## 2016-01-13 DIAGNOSIS — M9901 Segmental and somatic dysfunction of cervical region: Secondary | ICD-10-CM | POA: Diagnosis not present

## 2016-01-13 DIAGNOSIS — Z87891 Personal history of nicotine dependence: Secondary | ICD-10-CM | POA: Diagnosis not present

## 2016-01-13 DIAGNOSIS — M5030 Other cervical disc degeneration, unspecified cervical region: Secondary | ICD-10-CM | POA: Diagnosis not present

## 2016-01-13 DIAGNOSIS — M9902 Segmental and somatic dysfunction of thoracic region: Secondary | ICD-10-CM | POA: Diagnosis not present

## 2016-01-13 DIAGNOSIS — I973 Postprocedural hypertension: Secondary | ICD-10-CM | POA: Diagnosis not present

## 2016-01-13 DIAGNOSIS — M9905 Segmental and somatic dysfunction of pelvic region: Secondary | ICD-10-CM | POA: Diagnosis not present

## 2016-01-13 DIAGNOSIS — M9904 Segmental and somatic dysfunction of sacral region: Secondary | ICD-10-CM | POA: Diagnosis not present

## 2016-01-13 DIAGNOSIS — M9903 Segmental and somatic dysfunction of lumbar region: Secondary | ICD-10-CM | POA: Diagnosis not present

## 2016-03-03 DIAGNOSIS — M9901 Segmental and somatic dysfunction of cervical region: Secondary | ICD-10-CM | POA: Diagnosis not present

## 2016-03-03 DIAGNOSIS — M9904 Segmental and somatic dysfunction of sacral region: Secondary | ICD-10-CM | POA: Diagnosis not present

## 2016-03-03 DIAGNOSIS — M5030 Other cervical disc degeneration, unspecified cervical region: Secondary | ICD-10-CM | POA: Diagnosis not present

## 2016-03-03 DIAGNOSIS — I973 Postprocedural hypertension: Secondary | ICD-10-CM | POA: Diagnosis not present

## 2016-03-03 DIAGNOSIS — M9902 Segmental and somatic dysfunction of thoracic region: Secondary | ICD-10-CM | POA: Diagnosis not present

## 2016-03-03 DIAGNOSIS — M9903 Segmental and somatic dysfunction of lumbar region: Secondary | ICD-10-CM | POA: Diagnosis not present

## 2016-03-03 DIAGNOSIS — M9905 Segmental and somatic dysfunction of pelvic region: Secondary | ICD-10-CM | POA: Diagnosis not present

## 2016-03-03 DIAGNOSIS — Z87891 Personal history of nicotine dependence: Secondary | ICD-10-CM | POA: Diagnosis not present

## 2016-03-07 ENCOUNTER — Telehealth: Payer: Self-pay | Admitting: Family Medicine

## 2016-03-24 ENCOUNTER — Ambulatory Visit: Payer: Medicare Other | Admitting: Family Medicine

## 2016-03-28 NOTE — Telephone Encounter (Signed)
Spoke with patient and she is good now. She will call back to schedule follow up appointment.

## 2016-04-07 DIAGNOSIS — M9901 Segmental and somatic dysfunction of cervical region: Secondary | ICD-10-CM | POA: Diagnosis not present

## 2016-04-07 DIAGNOSIS — M9905 Segmental and somatic dysfunction of pelvic region: Secondary | ICD-10-CM | POA: Diagnosis not present

## 2016-04-07 DIAGNOSIS — M9902 Segmental and somatic dysfunction of thoracic region: Secondary | ICD-10-CM | POA: Diagnosis not present

## 2016-04-07 DIAGNOSIS — M5030 Other cervical disc degeneration, unspecified cervical region: Secondary | ICD-10-CM | POA: Diagnosis not present

## 2016-04-07 DIAGNOSIS — M9903 Segmental and somatic dysfunction of lumbar region: Secondary | ICD-10-CM | POA: Diagnosis not present

## 2016-04-07 DIAGNOSIS — Z87891 Personal history of nicotine dependence: Secondary | ICD-10-CM | POA: Diagnosis not present

## 2016-04-07 DIAGNOSIS — I973 Postprocedural hypertension: Secondary | ICD-10-CM | POA: Diagnosis not present

## 2016-04-07 DIAGNOSIS — M9904 Segmental and somatic dysfunction of sacral region: Secondary | ICD-10-CM | POA: Diagnosis not present

## 2016-05-05 ENCOUNTER — Encounter: Payer: Self-pay | Admitting: Family Medicine

## 2016-05-05 ENCOUNTER — Ambulatory Visit (INDEPENDENT_AMBULATORY_CARE_PROVIDER_SITE_OTHER): Payer: Medicare Other | Admitting: Family Medicine

## 2016-05-05 VITALS — BP 123/69 | HR 55 | Temp 97.9°F | Ht 63.5 in | Wt 128.8 lb

## 2016-05-05 DIAGNOSIS — Z1159 Encounter for screening for other viral diseases: Secondary | ICD-10-CM | POA: Diagnosis not present

## 2016-05-05 DIAGNOSIS — E039 Hypothyroidism, unspecified: Secondary | ICD-10-CM | POA: Diagnosis not present

## 2016-05-05 DIAGNOSIS — F411 Generalized anxiety disorder: Secondary | ICD-10-CM | POA: Diagnosis not present

## 2016-05-05 DIAGNOSIS — I10 Essential (primary) hypertension: Secondary | ICD-10-CM | POA: Diagnosis not present

## 2016-05-05 DIAGNOSIS — Z23 Encounter for immunization: Secondary | ICD-10-CM | POA: Diagnosis not present

## 2016-05-05 DIAGNOSIS — Z0000 Encounter for general adult medical examination without abnormal findings: Secondary | ICD-10-CM

## 2016-05-05 DIAGNOSIS — E785 Hyperlipidemia, unspecified: Secondary | ICD-10-CM

## 2016-05-05 MED ORDER — ESCITALOPRAM OXALATE 10 MG PO TABS: 10.0000 mg | ORAL_TABLET | Freq: Every day | ORAL | 3 refills | Status: DC

## 2016-05-05 MED ORDER — LOVASTATIN 20 MG PO TABS: 20.0000 mg | ORAL_TABLET | Freq: Every day | ORAL | 3 refills | Status: DC

## 2016-05-05 MED ORDER — LEVOTHYROXINE SODIUM 75 MCG PO TABS: 75.0000 ug | ORAL_TABLET | Freq: Every day | ORAL | 3 refills | Status: DC

## 2016-05-05 MED ORDER — BISOPROLOL-HYDROCHLOROTHIAZIDE 10-6.25 MG PO TABS: 1.0000 | ORAL_TABLET | Freq: Every day | ORAL | 3 refills | Status: DC

## 2016-05-05 NOTE — Progress Notes (Signed)
BP 123/69   Pulse (!) 55   Temp 97.9 F (36.6 C) (Oral)   Ht 5' 3.5" (1.613 m)   Wt 128 lb 12.8 oz (58.4 kg)   BMI 22.46 kg/m    Subjective:    Patient ID: Tammie Martin, female    DOB: Dec 12, 1950, 65 y.o.   MRN: [id number]  HPI: Tammie Martin is a 65 y.o. female presenting on 05/05/2016 for Hyperlipidemia (3 month followup); Hypertension; and Hypothyroidism   HPI Hypothyroidism Patient is coming in today for recheck of her thyroid. She is currently taking 75 g daily. She denies any issues such as heat or cold issues or hair or nail changes or palpitations or constipation or diarrhea.  Hypertension recheck Patient is coming in today for a hypertension recheck. Her blood pressure today is 123/69. She says she is not having any more hypotensive episodes or lightheadedness or dizziness over the past 6 months. She is very happy with where she sat on her current medication. She is currently on bisoprolol hydrochlorothiazide combination.  Hyperlipidemia recheck Patient is currently taking lovastatin and denies any issues with myalgias or liver issues that she knows of. She is here for recheck today. She is not fasting so we will put the labs since she can do them in the future.  Anxiety and depression recheck Patient is coming in today for an anxiety and depression recheck. She is currently on Lexapro 10 mg. She says her anxiety is doing very well and she does not have any depression. She says she is feeling a lot more comfortable with her life and overall having a lot less panic attacks and stress attacks. She denies any suicidal ideations or thoughts of hopelessness or helplessness. She is overall very happy with her life.  Relevant past medical, surgical, family and social history reviewed and updated as indicated. Interim medical history since our last visit reviewed. Allergies and medications reviewed and updated.  Review of Systems  Constitutional: Negative for chills and  fever.  HENT: Negative for congestion, ear discharge and ear pain.   Eyes: Negative for redness and visual disturbance.  Respiratory: Negative for chest tightness and shortness of breath.   Cardiovascular: Negative for chest pain and leg swelling.  Genitourinary: Negative for difficulty urinating and dysuria.  Musculoskeletal: Negative for back pain and gait problem.  Skin: Negative for rash.  Neurological: Negative for dizziness, light-headedness and headaches.  Psychiatric/Behavioral: Positive for decreased concentration and dysphoric mood. Negative for agitation, behavioral problems, self-injury, sleep disturbance and suicidal ideas. The patient is nervous/anxious.   All other systems reviewed and are negative.   Per HPI unless specifically indicated above     Medication List       Accurate as of 05/05/16  3:51 PM. Always use your most recent med list.          bisoprolol-hydrochlorothiazide 10-6.25 MG tablet Commonly known as:  ZIAC Take 1 tablet by mouth daily.   calcium gluconate 500 MG tablet Take 1 tablet by mouth daily.   cetirizine 10 MG tablet Commonly known as:  ZYRTEC Take 10 mg by mouth daily.   cholecalciferol 1000 units tablet Commonly known as:  VITAMIN D Take 2,000 Units by mouth 2 (two) times daily.   escitalopram 10 MG tablet Commonly known as:  LEXAPRO Take 1 tablet (10 mg total) by mouth daily.   Fish Oil 1000 MG Caps Take 2 capsules by mouth daily.   Flax Seed Oil 1000 MG Caps Take 2,000 mg  by mouth daily.   Glucosamine 500 MG Caps Take 1,000 mg by mouth 2 (two) times daily.   levothyroxine 75 MCG tablet Commonly known as:  SYNTHROID, LEVOTHROID Take 1 tablet (75 mcg total) by mouth daily before breakfast.   lovastatin 20 MG tablet Commonly known as:  MEVACOR Take 1 tablet (20 mg total) by mouth at bedtime.   MULTIVITAMIN ADULT PO Take 1 capsule by mouth daily.   PROBIOTIC ADVANCED PO Take by mouth daily.   Turmeric 500 MG  Caps Take 500 mg by mouth daily.          Objective:    BP 123/69   Pulse (!) 55   Temp 97.9 F (36.6 C) (Oral)   Ht 5' 3.5" (1.613 m)   Wt 128 lb 12.8 oz (58.4 kg)   BMI 22.46 kg/m   Wt Readings from Last 3 Encounters:  05/05/16 128 lb 12.8 oz (58.4 kg)  12/17/15 127 lb 9.6 oz (57.9 kg)  11/20/15 128 lb (58.1 kg)    Physical Exam  Constitutional: She is oriented to person, place, and time. She appears well-developed and well-nourished. No distress.  Eyes: Conjunctivae are normal.  Neck: Neck supple. No thyromegaly present.  Cardiovascular: Normal rate, regular rhythm, normal heart sounds and intact distal pulses.   No murmur heard. Pulmonary/Chest: Effort normal and breath sounds normal. No respiratory distress. She has no wheezes.  Musculoskeletal: Normal range of motion. She exhibits no edema or tenderness.  Lymphadenopathy:    She has no cervical adenopathy.  Neurological: She is alert and oriented to person, place, and time. Coordination normal.  Skin: Skin is warm and dry. No rash noted. She is not diaphoretic.  Psychiatric: Her behavior is normal. Her mood appears anxious. She does not exhibit a depressed mood. She expresses no suicidal ideation. She expresses no suicidal plans.  Nursing note and vitals reviewed.     Assessment & Plan:   Problem List Items Addressed This Visit      Cardiovascular and Mediastinum   Essential hypertension, benign - Primary   Relevant Medications   bisoprolol-hydrochlorothiazide (ZIAC) 10-6.25 MG tablet   lovastatin (MEVACOR) 20 MG tablet   Other Relevant Orders   CMP14+EGFR     Endocrine   Hypothyroidism   Relevant Medications   levothyroxine (SYNTHROID, LEVOTHROID) 75 MCG tablet   Other Relevant Orders   TSH     Other   Hyperlipidemia LDL goal <130   Relevant Medications   bisoprolol-hydrochlorothiazide (ZIAC) 10-6.25 MG tablet   lovastatin (MEVACOR) 20 MG tablet   Other Relevant Orders   Lipid panel   Generalized  anxiety disorder   Relevant Medications   escitalopram (LEXAPRO) 10 MG tablet    Other Visit Diagnoses    Need for hepatitis C screening test       Relevant Orders   Hepatitis C antibody       Follow up plan: Return in about 6 months (around 11/02/2016), or if symptoms worsen or fail to improve, for Hypothyroid and hyperlipidemia and hypertension.  Counseling provided for all of the vaccine components Orders Placed This Encounter  Procedures  . CMP14+EGFR  . Lipid panel  . TSH  . Hepatitis C antibody    Caryl Pina, MD [facility] 05/05/2016, 3:51 PM

## 2016-05-05 NOTE — Addendum Note (Signed)
Addended by: Michaela Corner on: 05/05/2016 04:16 PM   Modules accepted: Orders

## 2016-05-26 DIAGNOSIS — M9903 Segmental and somatic dysfunction of lumbar region: Secondary | ICD-10-CM | POA: Diagnosis not present

## 2016-05-26 DIAGNOSIS — M9905 Segmental and somatic dysfunction of pelvic region: Secondary | ICD-10-CM | POA: Diagnosis not present

## 2016-05-26 DIAGNOSIS — M5030 Other cervical disc degeneration, unspecified cervical region: Secondary | ICD-10-CM | POA: Diagnosis not present

## 2016-05-26 DIAGNOSIS — M9901 Segmental and somatic dysfunction of cervical region: Secondary | ICD-10-CM | POA: Diagnosis not present

## 2016-05-26 DIAGNOSIS — I973 Postprocedural hypertension: Secondary | ICD-10-CM | POA: Diagnosis not present

## 2016-05-26 DIAGNOSIS — M9904 Segmental and somatic dysfunction of sacral region: Secondary | ICD-10-CM | POA: Diagnosis not present

## 2016-05-26 DIAGNOSIS — M9902 Segmental and somatic dysfunction of thoracic region: Secondary | ICD-10-CM | POA: Diagnosis not present

## 2016-05-26 DIAGNOSIS — Z87891 Personal history of nicotine dependence: Secondary | ICD-10-CM | POA: Diagnosis not present

## 2016-07-04 DIAGNOSIS — M9901 Segmental and somatic dysfunction of cervical region: Secondary | ICD-10-CM | POA: Diagnosis not present

## 2016-07-04 DIAGNOSIS — Z87891 Personal history of nicotine dependence: Secondary | ICD-10-CM | POA: Diagnosis not present

## 2016-07-04 DIAGNOSIS — M9905 Segmental and somatic dysfunction of pelvic region: Secondary | ICD-10-CM | POA: Diagnosis not present

## 2016-07-04 DIAGNOSIS — M9903 Segmental and somatic dysfunction of lumbar region: Secondary | ICD-10-CM | POA: Diagnosis not present

## 2016-07-04 DIAGNOSIS — M5030 Other cervical disc degeneration, unspecified cervical region: Secondary | ICD-10-CM | POA: Diagnosis not present

## 2016-07-04 DIAGNOSIS — M9902 Segmental and somatic dysfunction of thoracic region: Secondary | ICD-10-CM | POA: Diagnosis not present

## 2016-07-04 DIAGNOSIS — I973 Postprocedural hypertension: Secondary | ICD-10-CM | POA: Diagnosis not present

## 2016-07-04 DIAGNOSIS — M9904 Segmental and somatic dysfunction of sacral region: Secondary | ICD-10-CM | POA: Diagnosis not present

## 2016-07-19 ENCOUNTER — Ambulatory Visit (INDEPENDENT_AMBULATORY_CARE_PROVIDER_SITE_OTHER): Payer: Medicare Other | Admitting: *Deleted

## 2016-07-19 DIAGNOSIS — Z23 Encounter for immunization: Secondary | ICD-10-CM

## 2016-07-28 DIAGNOSIS — I973 Postprocedural hypertension: Secondary | ICD-10-CM | POA: Diagnosis not present

## 2016-07-28 DIAGNOSIS — M9905 Segmental and somatic dysfunction of pelvic region: Secondary | ICD-10-CM | POA: Diagnosis not present

## 2016-07-28 DIAGNOSIS — M5030 Other cervical disc degeneration, unspecified cervical region: Secondary | ICD-10-CM | POA: Diagnosis not present

## 2016-07-28 DIAGNOSIS — M9902 Segmental and somatic dysfunction of thoracic region: Secondary | ICD-10-CM | POA: Diagnosis not present

## 2016-07-28 DIAGNOSIS — Z87891 Personal history of nicotine dependence: Secondary | ICD-10-CM | POA: Diagnosis not present

## 2016-07-28 DIAGNOSIS — M9901 Segmental and somatic dysfunction of cervical region: Secondary | ICD-10-CM | POA: Diagnosis not present

## 2016-07-28 DIAGNOSIS — M9903 Segmental and somatic dysfunction of lumbar region: Secondary | ICD-10-CM | POA: Diagnosis not present

## 2016-07-28 DIAGNOSIS — M9904 Segmental and somatic dysfunction of sacral region: Secondary | ICD-10-CM | POA: Diagnosis not present

## 2016-11-09 DIAGNOSIS — M9902 Segmental and somatic dysfunction of thoracic region: Secondary | ICD-10-CM | POA: Diagnosis not present

## 2016-11-09 DIAGNOSIS — M9903 Segmental and somatic dysfunction of lumbar region: Secondary | ICD-10-CM | POA: Diagnosis not present

## 2016-11-09 DIAGNOSIS — M9904 Segmental and somatic dysfunction of sacral region: Secondary | ICD-10-CM | POA: Diagnosis not present

## 2016-11-09 DIAGNOSIS — M5030 Other cervical disc degeneration, unspecified cervical region: Secondary | ICD-10-CM | POA: Diagnosis not present

## 2016-11-09 DIAGNOSIS — Z87891 Personal history of nicotine dependence: Secondary | ICD-10-CM | POA: Diagnosis not present

## 2016-11-09 DIAGNOSIS — M9901 Segmental and somatic dysfunction of cervical region: Secondary | ICD-10-CM | POA: Diagnosis not present

## 2016-11-09 DIAGNOSIS — I973 Postprocedural hypertension: Secondary | ICD-10-CM | POA: Diagnosis not present

## 2016-11-09 DIAGNOSIS — M9905 Segmental and somatic dysfunction of pelvic region: Secondary | ICD-10-CM | POA: Diagnosis not present

## 2016-12-23 ENCOUNTER — Ambulatory Visit (INDEPENDENT_AMBULATORY_CARE_PROVIDER_SITE_OTHER): Payer: Medicare Other | Admitting: Physician Assistant

## 2016-12-23 ENCOUNTER — Ambulatory Visit (INDEPENDENT_AMBULATORY_CARE_PROVIDER_SITE_OTHER): Payer: Medicare Other

## 2016-12-23 ENCOUNTER — Encounter: Payer: Self-pay | Admitting: Physician Assistant

## 2016-12-23 VITALS — BP 108/64 | HR 56 | Temp 97.6°F | Ht 63.5 in | Wt 137.6 lb

## 2016-12-23 DIAGNOSIS — M7022 Olecranon bursitis, left elbow: Secondary | ICD-10-CM

## 2016-12-23 DIAGNOSIS — W19XXXA Unspecified fall, initial encounter: Secondary | ICD-10-CM

## 2016-12-23 DIAGNOSIS — M25512 Pain in left shoulder: Secondary | ICD-10-CM

## 2016-12-23 DIAGNOSIS — M25522 Pain in left elbow: Secondary | ICD-10-CM

## 2016-12-23 IMAGING — DX DG SHOULDER 2+V*L*
3 series · 3 of 3 positions shown · non-contrast
Comparison: None.

CLINICAL DATA: Fall 2 months ago with persistent left shoulder
pain, initial encounter

EXAM:
LEFT SHOULDER - 2+ VIEW

[shoulder ap]
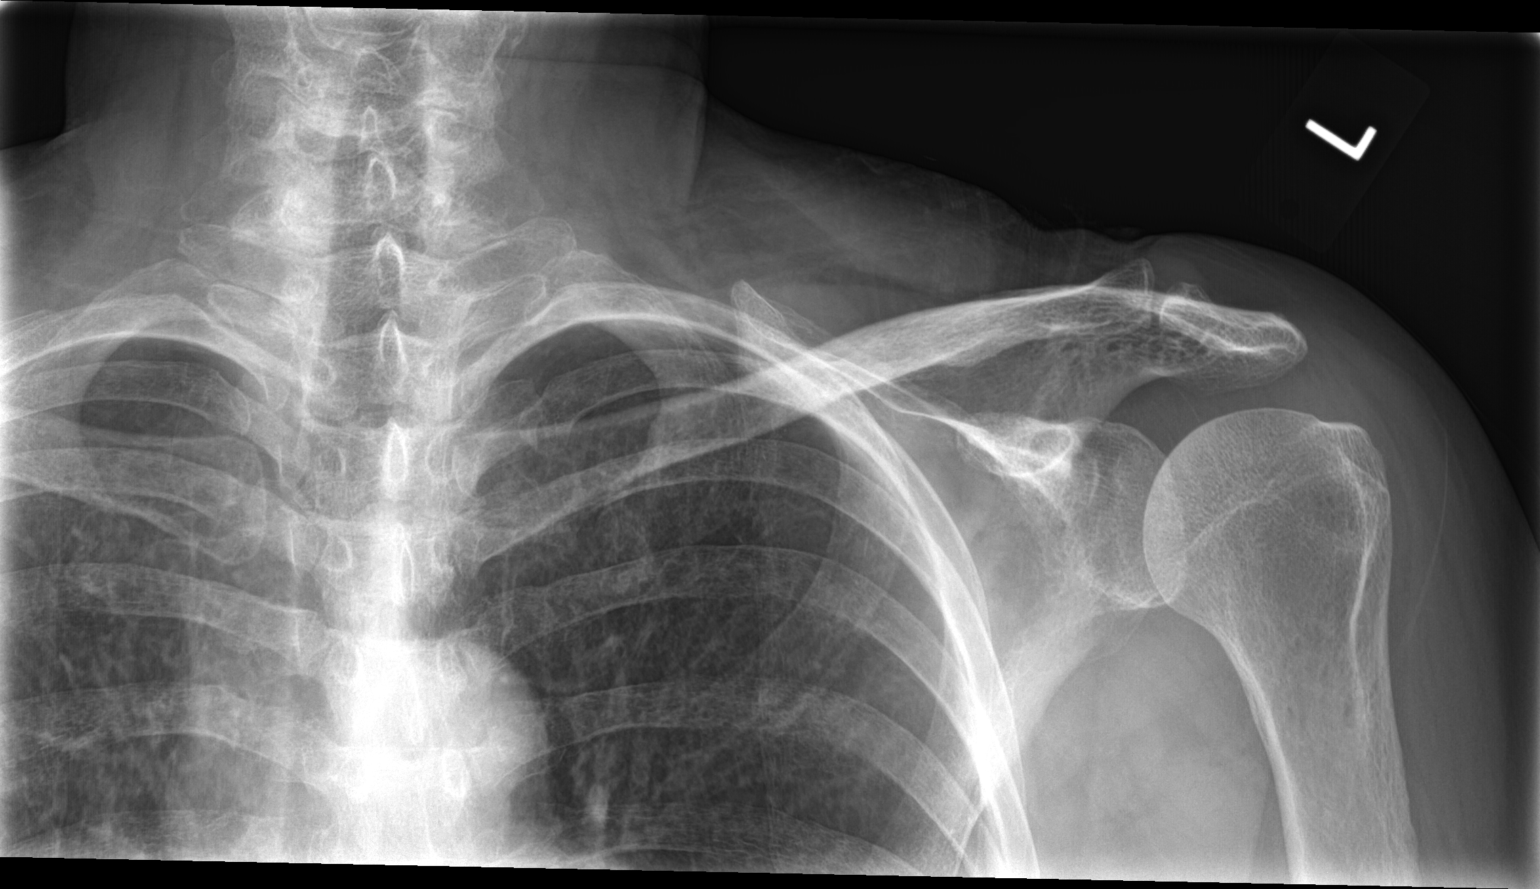

[shoulder obl]
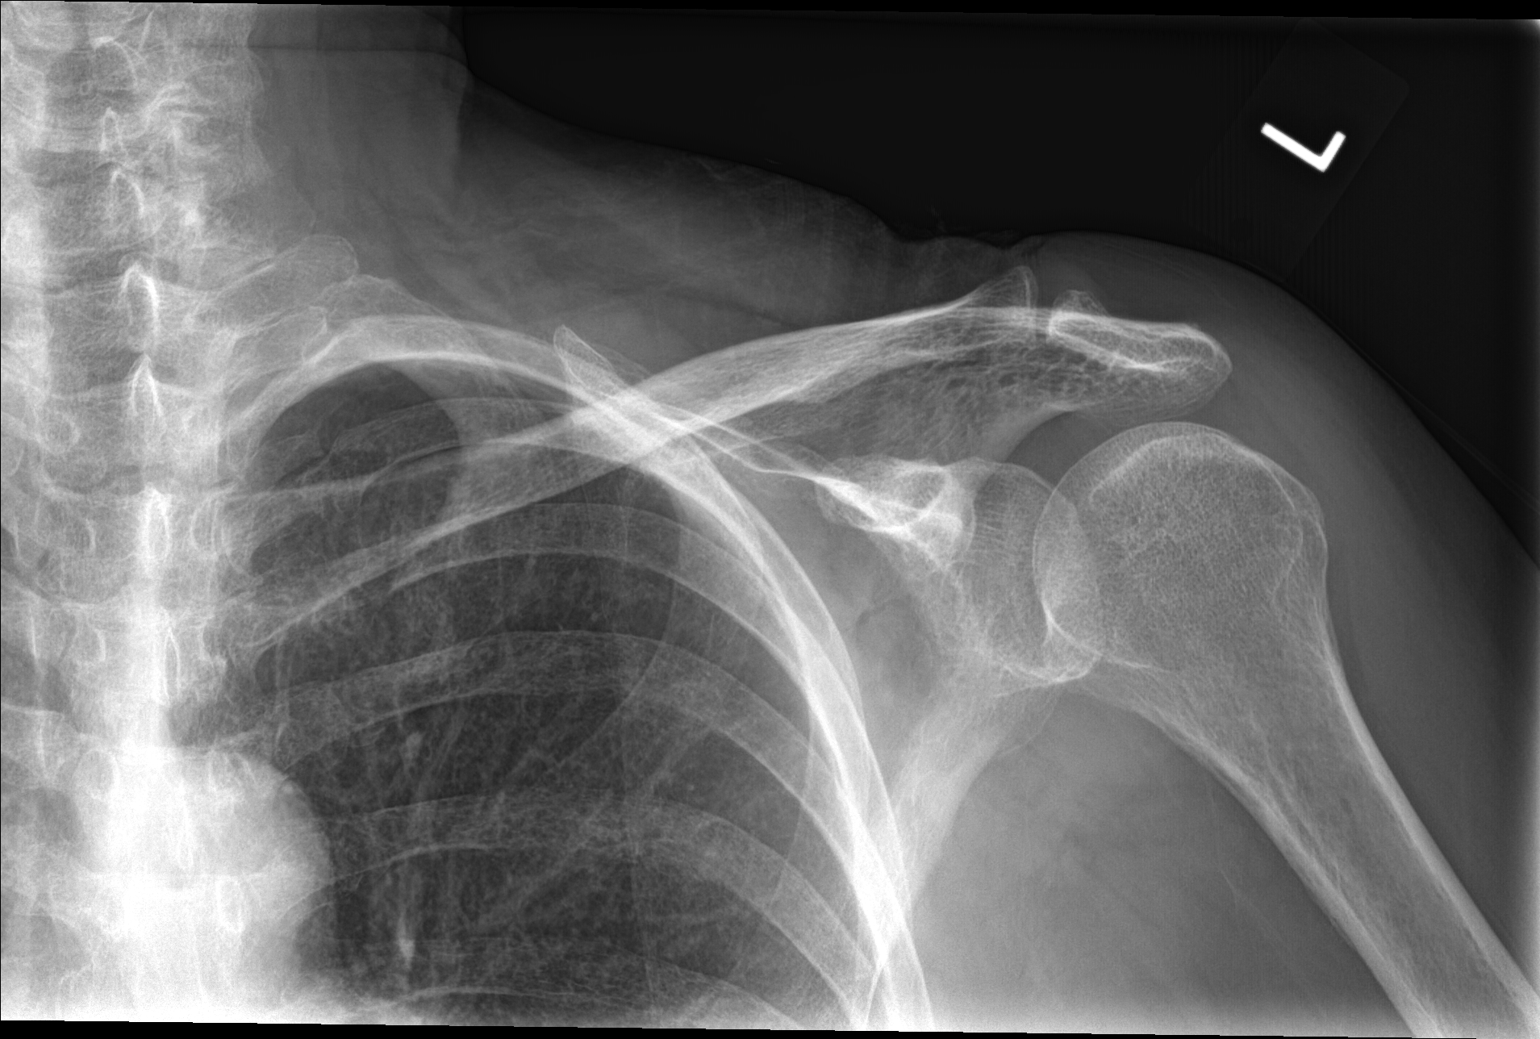

[shoulder axial]
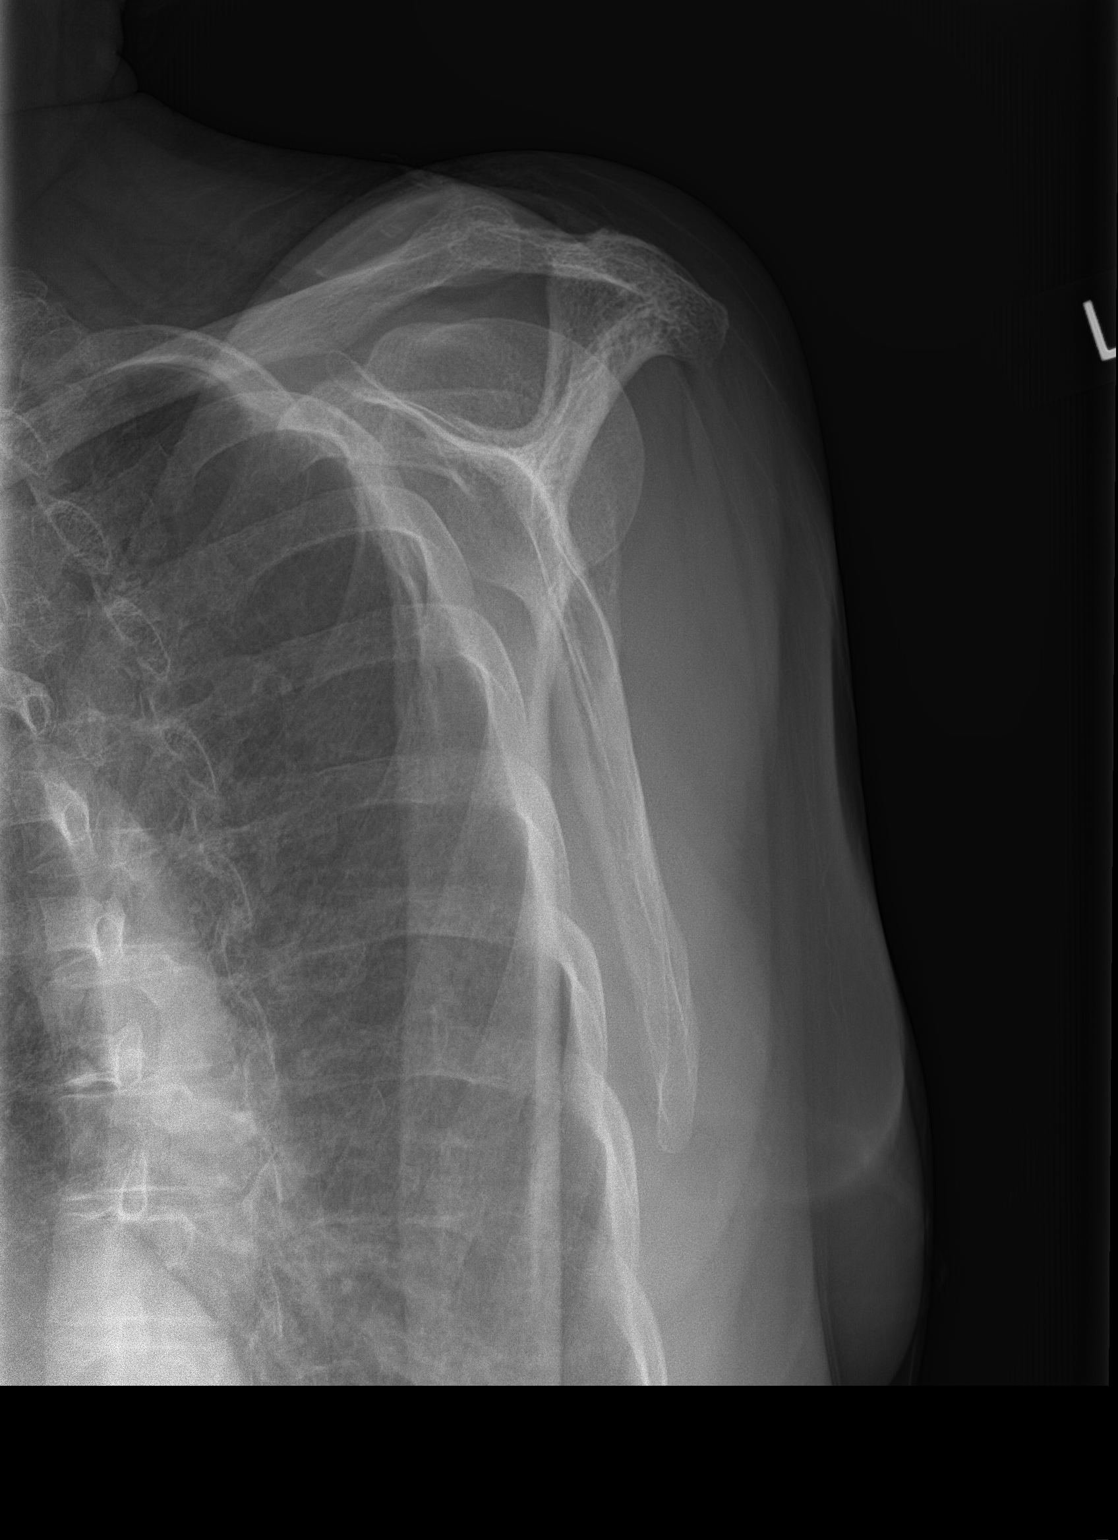

[3 of 3 positions shown; findings below may reference images not displayed]

FINDINGS: Degenerative changes of the acromioclavicular joint are seen. No
acute fracture or dislocation is noted. The underlying bony thorax
is within normal limits.
IMPRESSION: No acute abnormality noted.

## 2016-12-23 IMAGING — DX DG ELBOW 2V*L*
2 series · 2 of 2 positions shown · non-contrast
Comparison: None.

CLINICAL DATA: Recent fall with left elbow pain, initial encounter

EXAM:
LEFT ELBOW - 2 VIEW

[elbow ap]
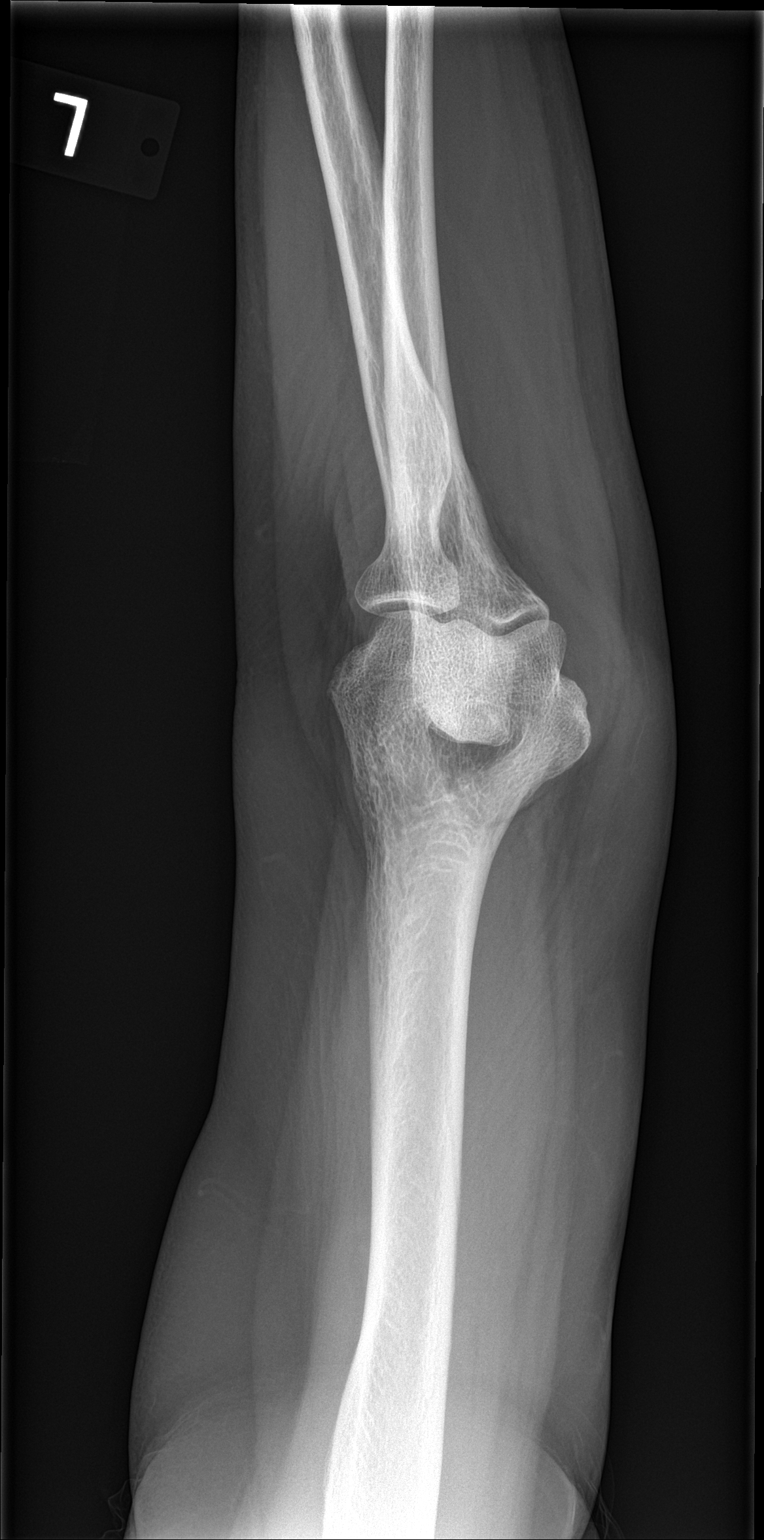

[elbow lat]
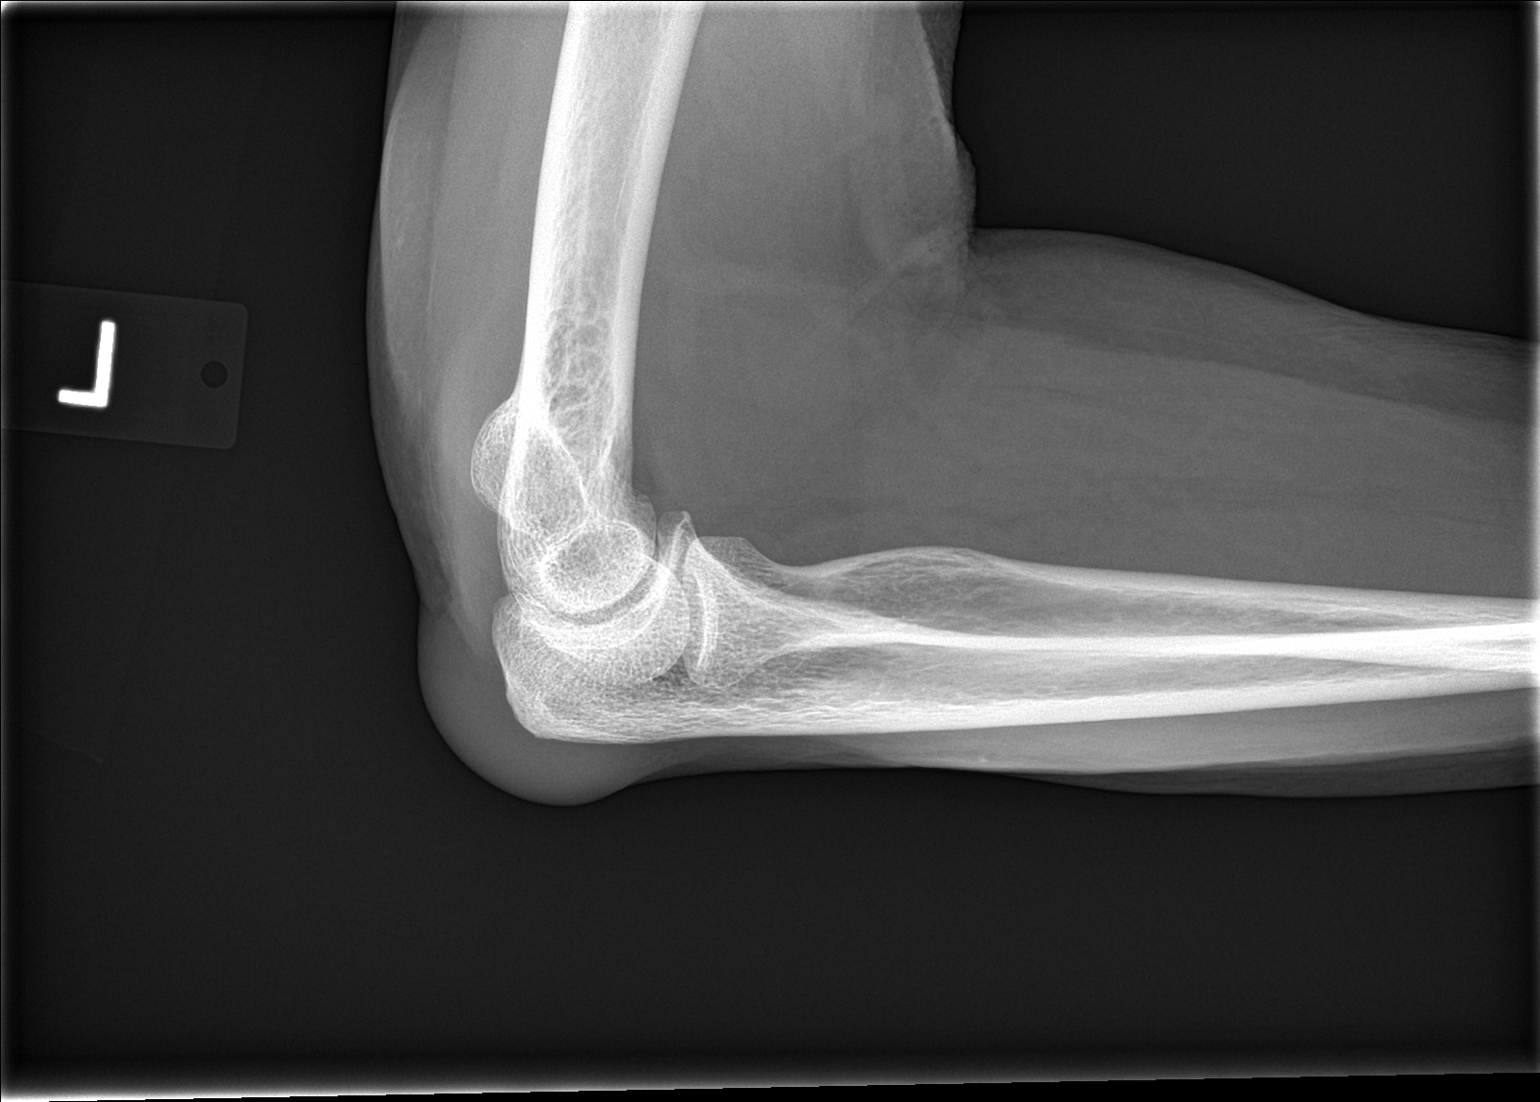

[2 of 2 positions shown; findings below may reference images not displayed]

FINDINGS: No acute fracture or dislocation is noted. Focal soft tissue
swelling is noted over the olecranon which may be related to
olecranon bursitis. No other focal abnormality is seen.
IMPRESSION: Changes suggestive of olecranon bursitis.

No acute fractures noted.

## 2016-12-23 MED ORDER — IBUPROFEN 800 MG PO TABS: 800.0000 mg | ORAL_TABLET | Freq: Three times a day (TID) | ORAL | 2 refills | Status: DC | PRN

## 2016-12-23 MED ORDER — METHYLPREDNISOLONE ACETATE 80 MG/ML IJ SUSP
80.0000 mg | Freq: Once | INTRAMUSCULAR | Status: AC
  Administered 2016-12-23: 80 mg via INTRAMUSCULAR

## 2016-12-23 NOTE — Patient Instructions (Signed)
Elbow Bursitis  A bursa is a fluid-filled sac that covers and protects a joint. Bursitis is when the fluid-filled sac gets puffy and sore (inflamed). Elbow bursitis, also called olecranon bursitis, happens over your elbow. This may be caused by:  · Injury (acute trauma) to your elbow.  · Leaning on hard surfaces for long periods of time.  · Infection from an injury that breaks the skin near your elbow.  · A bone growth (spur) that forms at the tip of your elbow.  · A medical condition that causes inflammation in your body, such as:  ? Gout.  ? Rheumatoid arthritis.    Sometimes the cause is not known.  Follow these instructions at home:  · Take medicines only as told by your doctor.  · If you were prescribed an antibiotic medicine, finish all of it even if you start to feel better.  · If your bursitis is caused by an injury, rest your elbow and wear your bandage as told by your doctor. You may also apply ice to the injured area as told by your doctor:  ? Put ice in a plastic bag.  ? Place a towel between your skin and the bag.  ? Leave the ice on for 20 minutes, 2-3 times per day.  · Do not do any activities that cause pain to your elbow.  · Use elbow pads or wraps to cushion your elbow.  Contact a doctor if:  · You have a fever.  · Your symptoms do not get better with treatment.  · Your pain or swelling gets worse.  · Your pain or swelling goes away and then comes back.  · You have drainage of pus from the swollen area over your elbow.  This information is not intended to replace advice given to you by your health care provider. Make sure you discuss any questions you have with your health care provider.  Document Released: 02/02/2010 Document Revised: 01/21/2016 Document Reviewed: 04/23/2014  [employer]vier Interactive Patient Education © 2017 [employer]

## 2016-12-23 NOTE — Progress Notes (Signed)
BP 108/64   Pulse (!) 56   Temp 97.6 F (36.4 C) (Oral)   Ht 5' 3.5" (1.613 m)   Wt 137 lb 9.6 oz (62.4 kg)   BMI 23.99 kg/m    Subjective:    Patient ID: Tammie Martin, female    DOB: [birth year], 66 y.o.   MRN: [id number]  HPI: Tammie Martin is a 66 y.o. female presenting on 12/23/2016 for Fall; Elbow Pain (left ); and Shoulder Pain (left)  Approximately 2 weeks ago the patient had a fall on some black ice. She fell landing on her elbow and shoulder. These are on the left side. She has had pain be quite significant in the beginning. It has reduced some. She has had increased swelling at the elbow. The shoulder is starting to get more strength. However it hurts most of the time. She has not been using the medication regularly at this time.  Relevant past medical, surgical, family and social history reviewed and updated as indicated. Allergies and medications reviewed and updated.  Past Medical History:  Diagnosis Date  . Depression   . DJD (degenerative joint disease)   . Hyperlipidemia   . Hypertension   . Thyroid disease     Past Surgical History:  Procedure Laterality Date  . ABDOMINAL HYSTERECTOMY    . APPENDECTOMY    . BREAST ENHANCEMENT SURGERY    . TONSILLECTOMY      Review of Systems  Constitutional: Negative.  Negative for activity change, fatigue and fever.  HENT: Negative.   Eyes: Negative.   Respiratory: Negative.  Negative for cough.   Cardiovascular: Negative.  Negative for chest pain.  Gastrointestinal: Negative.  Negative for abdominal pain.  Endocrine: Negative.   Genitourinary: Negative.  Negative for dysuria.  Musculoskeletal: Positive for arthralgias, joint swelling and myalgias.  Skin: Negative.   Neurological: Negative.     Allergies as of 12/23/2016      Reactions   Penicillins Hives   Codeine Rash      Medication List       Accurate as of 12/23/16  3:08 PM. Always use your most recent med list.            bisoprolol-hydrochlorothiazide 10-6.25 MG tablet Commonly known as:  ZIAC Take 1 tablet by mouth daily.   calcium gluconate 500 MG tablet Take 1 tablet by mouth daily.   cetirizine 10 MG tablet Commonly known as:  ZYRTEC Take 10 mg by mouth daily.   cholecalciferol 1000 units tablet Commonly known as:  VITAMIN D Take 2,000 Units by mouth 2 (two) times daily.   escitalopram 10 MG tablet Commonly known as:  LEXAPRO Take 1 tablet (10 mg total) by mouth daily.   Fish Oil 1000 MG Caps Take 2 capsules by mouth daily.   Flax Seed Oil 1000 MG Caps Take 2,000 mg by mouth daily.   Glucosamine 500 MG Caps Take 1,000 mg by mouth 2 (two) times daily.   ibuprofen 800 MG tablet Commonly known as:  ADVIL,MOTRIN Take 1 tablet (800 mg total) by mouth every 8 (eight) hours as needed.   levothyroxine 75 MCG tablet Commonly known as:  SYNTHROID, LEVOTHROID Take 1 tablet (75 mcg total) by mouth daily before breakfast.   lovastatin 20 MG tablet Commonly known as:  MEVACOR Take 1 tablet (20 mg total) by mouth at bedtime.   MULTIVITAMIN ADULT PO Take 1 capsule by mouth daily.   PROBIOTIC ADVANCED PO Take by mouth daily.   Turmeric  500 MG Caps Take 500 mg by mouth daily.          Objective:    BP 108/64   Pulse (!) 56   Temp 97.6 F (36.4 C) (Oral)   Ht 5' 3.5" (1.613 m)   Wt 137 lb 9.6 oz (62.4 kg)   BMI 23.99 kg/m   Allergies  Allergen Reactions  . Penicillins Hives  . Codeine Rash    Physical Exam  Constitutional: She is oriented to person, place, and time. She appears well-developed and well-nourished.  HENT:  Head: Normocephalic and atraumatic.  Eyes: Conjunctivae and EOM are normal. Pupils are equal, round, and reactive to light.  Cardiovascular: Normal rate, regular rhythm, normal heart sounds and intact distal pulses.   Pulmonary/Chest: Effort normal and breath sounds normal.  Abdominal: Soft. Bowel sounds are normal.  Musculoskeletal:       Left  shoulder: She exhibits tenderness. She exhibits normal range of motion, no bony tenderness, no swelling, no effusion, no crepitus, no spasm and normal strength.       Left elbow: She exhibits decreased range of motion and effusion. Tenderness found. Olecranon process tenderness noted.       Arms: Swollen olecranon bursa, non-red, some tenderness  Neurological: She is alert and oriented to person, place, and time. She has normal reflexes.  Skin: Skin is warm and dry. No rash noted.  Psychiatric: She has a normal mood and affect. Her behavior is normal. Judgment and thought content normal.  Nursing note and vitals reviewed.       Assessment & Plan:   1. Fall, initial encounter - DG Shoulder Left; Future - DG Elbow 2 Views Left; Future No abnormalities seen 2. Olecranon bursitis of left elbow - ibuprofen (ADVIL,MOTRIN) 800 MG tablet; Take 1 tablet (800 mg total) by mouth every 8 (eight) hours as needed.  Dispense: 90 tablet; Refill: 2 - methylPREDNISolone acetate (DEPO-MEDROL) injection 80 mg; Inject 1 mL (80 mg total) into the muscle once. ACE and ice   Current Outpatient Prescriptions:  .  bisoprolol-hydrochlorothiazide (ZIAC) 10-6.25 MG tablet, Take 1 tablet by mouth daily., Disp: 90 tablet, Rfl: 3 .  calcium gluconate 500 MG tablet, Take 1 tablet by mouth daily. , Disp: , Rfl:  .  cetirizine (ZYRTEC) 10 MG tablet, Take 10 mg by mouth daily., Disp: , Rfl:  .  cholecalciferol (VITAMIN D) 1000 units tablet, Take 2,000 Units by mouth 2 (two) times daily., Disp: , Rfl:  .  escitalopram (LEXAPRO) 10 MG tablet, Take 1 tablet (10 mg total) by mouth daily., Disp: 90 tablet, Rfl: 3 .  Flaxseed, Linseed, (FLAX SEED OIL) 1000 MG CAPS, Take 2,000 mg by mouth daily., Disp: , Rfl:  .  Glucosamine 500 MG CAPS, Take 1,000 mg by mouth 2 (two) times daily. , Disp: , Rfl:  .  levothyroxine (SYNTHROID, LEVOTHROID) 75 MCG tablet, Take 1 tablet (75 mcg total) by mouth daily before breakfast., Disp: 90  tablet, Rfl: 3 .  lovastatin (MEVACOR) 20 MG tablet, Take 1 tablet (20 mg total) by mouth at bedtime., Disp: 90 tablet, Rfl: 3 .  Multiple Vitamins-Minerals (MULTIVITAMIN ADULT PO), Take 1 capsule by mouth daily., Disp: , Rfl:  .  Omega-3 Fatty Acids (FISH OIL) 1000 MG CAPS, Take 2 capsules by mouth daily., Disp: , Rfl:  .  Probiotic Product (PROBIOTIC ADVANCED PO), Take by mouth daily., Disp: , Rfl:  .  Turmeric 500 MG CAPS, Take 500 mg by mouth daily., Disp: , Rfl:  .  ibuprofen (ADVIL,MOTRIN) 800 MG tablet, Take 1 tablet (800 mg total) by mouth every 8 (eight) hours as needed., Disp: 90 tablet, Rfl: 2  Current Facility-Administered Medications:  .  methylPREDNISolone acetate (DEPO-MEDROL) injection 80 mg, 80 mg, Intramuscular, Once, Terald Sleeper, PA-C  Continue all other maintenance medications as listed above.  Follow up plan: Return if symptoms worsen or fail to improve.  Educational handout given for bursitis  Terald Sleeper PA-C [facility] [address]  [place], [place] [address] [phone]   12/23/2016, 3:08 PM

## 2016-12-26 ENCOUNTER — Ambulatory Visit: Payer: Medicare Other | Admitting: Family Medicine

## 2017-01-04 DIAGNOSIS — M9905 Segmental and somatic dysfunction of pelvic region: Secondary | ICD-10-CM | POA: Diagnosis not present

## 2017-01-04 DIAGNOSIS — M5137 Other intervertebral disc degeneration, lumbosacral region: Secondary | ICD-10-CM | POA: Diagnosis not present

## 2017-01-04 DIAGNOSIS — M9904 Segmental and somatic dysfunction of sacral region: Secondary | ICD-10-CM | POA: Diagnosis not present

## 2017-01-04 DIAGNOSIS — M9903 Segmental and somatic dysfunction of lumbar region: Secondary | ICD-10-CM | POA: Diagnosis not present

## 2017-01-06 ENCOUNTER — Ambulatory Visit: Payer: Medicare Other | Admitting: Physician Assistant

## 2017-01-12 ENCOUNTER — Ambulatory Visit (INDEPENDENT_AMBULATORY_CARE_PROVIDER_SITE_OTHER): Payer: Medicare Other | Admitting: Physician Assistant

## 2017-01-12 ENCOUNTER — Encounter: Payer: Self-pay | Admitting: Physician Assistant

## 2017-01-12 VITALS — BP 131/79 | HR 66 | Temp 97.2°F | Ht 63.5 in | Wt 134.0 lb

## 2017-01-12 DIAGNOSIS — E039 Hypothyroidism, unspecified: Secondary | ICD-10-CM

## 2017-01-12 DIAGNOSIS — J301 Allergic rhinitis due to pollen: Secondary | ICD-10-CM | POA: Insufficient documentation

## 2017-01-12 DIAGNOSIS — F411 Generalized anxiety disorder: Secondary | ICD-10-CM

## 2017-01-12 DIAGNOSIS — I10 Essential (primary) hypertension: Secondary | ICD-10-CM | POA: Diagnosis not present

## 2017-01-12 DIAGNOSIS — E785 Hyperlipidemia, unspecified: Secondary | ICD-10-CM | POA: Diagnosis not present

## 2017-01-12 DIAGNOSIS — Z716 Tobacco abuse counseling: Secondary | ICD-10-CM

## 2017-01-12 DIAGNOSIS — Z720 Tobacco use: Secondary | ICD-10-CM | POA: Diagnosis not present

## 2017-01-12 MED ORDER — VARENICLINE TARTRATE 0.5 MG X 11 & 1 MG X 42 PO MISC: ORAL | 5 refills | Status: DC

## 2017-01-12 MED ORDER — BISOPROLOL-HYDROCHLOROTHIAZIDE 10-6.25 MG PO TABS: 1.0000 | ORAL_TABLET | Freq: Every day | ORAL | 3 refills | Status: DC

## 2017-01-12 MED ORDER — MONTELUKAST SODIUM 10 MG PO TABS: 10.0000 mg | ORAL_TABLET | Freq: Every day | ORAL | 3 refills | Status: DC

## 2017-01-12 MED ORDER — FEXOFENADINE HCL 180 MG PO TABS: 180.0000 mg | ORAL_TABLET | Freq: Every day | ORAL | 3 refills | Status: DC

## 2017-01-12 MED ORDER — LOVASTATIN 20 MG PO TABS: 20.0000 mg | ORAL_TABLET | Freq: Every day | ORAL | 3 refills | Status: DC

## 2017-01-12 MED ORDER — ESCITALOPRAM OXALATE 10 MG PO TABS: 10.0000 mg | ORAL_TABLET | Freq: Every day | ORAL | 3 refills | Status: DC

## 2017-01-12 NOTE — Progress Notes (Signed)
BP 131/79   Pulse 66   Temp 97.2 F (36.2 C) (Oral)   Ht 5' 3.5" (1.613 m)   Wt 134 lb (60.8 kg)   BMI 23.36 kg/m    Subjective:    Patient ID: Tammie Martin, female    DOB: 09-22-1950, 66 y.o.   MRN: [id number]  HPI: Tammie Martin is a 66 y.o. female presenting on 01/12/2017 for Hypertension (pt here today for routine follow up of her chronic medical conditions and would also like to discuss quitting smoking.)  This patient comes in for periodic recheck on medications and conditions including Elevated cholesterol, hypothyroidism, allergic rhinitis, hypertension. She would also like to discuss smoking cessation. She had tried patches in the past and had some irritation to her skin. 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 She has not had lab work done for the past year. We will plan to do that today. She does need all of her maintenance medications sent into the [facility]. I told her we'll can hold on the Synthroid until her lab returns in case we need to adjust her dose. dose.   All medications are reviewed today. There are no reports of any problems with the medications. All of the medical conditions are reviewed and updated.  Lab work is reviewed and will be ordered as medically necessary. There are no new problems reported with today's visit.   Relevant past medical, surgical, family and social history reviewed and updated as indicated. Allergies and medications reviewed and updated.  Past Medical History:  Diagnosis Date  . Depression   . DJD (degenerative joint disease)   . Hyperlipidemia   . Hypertension   . Thyroid disease     Past Surgical History:  Procedure Laterality Date  . ABDOMINAL HYSTERECTOMY    . APPENDECTOMY    . BREAST ENHANCEMENT SURGERY    . TONSILLECTOMY      Review of Systems  Constitutional: Negative.  Negative for activity change, fatigue and fever.  HENT: Negative.   Eyes:  Negative.   Respiratory: Negative.  Negative for cough.   Cardiovascular: Negative.  Negative for chest pain.  Gastrointestinal: Negative.  Negative for abdominal pain.  Endocrine: Negative.   Genitourinary: Negative.  Negative for dysuria.  Musculoskeletal: Negative.   Skin: Negative.   Neurological: Negative.     Allergies as of 01/12/2017      Reactions   Penicillins Hives   Codeine Rash      Medication List       Accurate as of 01/12/17  2:47 PM. Always use your most recent med list.          bisoprolol-hydrochlorothiazide 10-6.25 MG tablet Commonly known as:  ZIAC Take 1 tablet by mouth daily.   calcium gluconate 500 MG tablet Take 1 tablet by mouth daily.   cetirizine 10 MG tablet Commonly known as:  ZYRTEC Take 10 mg by mouth daily.   cholecalciferol 1000 units tablet Commonly known as:  VITAMIN D Take 2,000 Units by mouth 2 (two) times daily.   escitalopram 10 MG tablet Commonly known as:  LEXAPRO Take 1 tablet (10 mg total) by mouth daily.   fexofenadine 180 MG tablet Commonly known as:  ALLEGRA ALLERGY Take 1 tablet (180 mg total) by mouth daily.   Fish Oil 1000 MG Caps Take 2 capsules by mouth daily.   Flax Seed Oil 1000 MG Caps Take 2,000 mg by  mouth daily.   Glucosamine 500 MG Caps Take 1,000 mg by mouth 2 (two) times daily.   ibuprofen 800 MG tablet Commonly known as:  ADVIL,MOTRIN Take 1 tablet (800 mg total) by mouth every 8 (eight) hours as needed.   levothyroxine 75 MCG tablet Commonly known as:  SYNTHROID, LEVOTHROID Take 1 tablet (75 mcg total) by mouth daily before breakfast.   lovastatin 20 MG tablet Commonly known as:  MEVACOR Take 1 tablet (20 mg total) by mouth at bedtime.   montelukast 10 MG tablet Commonly known as:  SINGULAIR Take 1 tablet (10 mg total) by mouth at bedtime.   MULTIVITAMIN ADULT PO Take 1 capsule by mouth daily.   PROBIOTIC ADVANCED PO Take by mouth daily.   Turmeric 500 MG Caps Take 500 mg by  mouth daily.   varenicline 0.5 MG X 11 & 1 MG X 42 tablet Commonly known as:  CHANTIX STARTING MONTH PAK Take one 0.5 mg tablet by mouth once daily for 3 days, then increase to one 0.5 mg tablet twice daily for 4 days, then increase to one 1 mg tablet twice daily.          Objective:    BP 131/79   Pulse 66   Temp 97.2 F (36.2 C) (Oral)   Ht 5' 3.5" (1.613 m)   Wt 134 lb (60.8 kg)   BMI 23.36 kg/m   Allergies  Allergen Reactions  . Penicillins Hives  . Codeine Rash    Physical Exam  Constitutional: She is oriented to person, place, and time. She appears well-developed and well-nourished.  HENT:  Head: Normocephalic and atraumatic.  Eyes: Conjunctivae and EOM are normal. Pupils are equal, round, and reactive to light.  Cardiovascular: Normal rate, regular rhythm and intact distal pulses.   Murmur heard.  Systolic murmur is present with a grade of 2/6  Pulmonary/Chest: Effort normal and breath sounds normal.  Abdominal: Soft. Bowel sounds are normal.  Neurological: She is alert and oriented to person, place, and time. She has normal reflexes.  Skin: Skin is warm and dry. No rash noted.  Psychiatric: She has a normal mood and affect. Her behavior is normal. Judgment and thought content normal.    Results for orders placed or performed in visit on 11/20/15  CMP14+EGFR  Result Value Ref Range   Glucose 91 65 - 99 mg/dL   BUN 12 8 - 27 mg/dL   Creatinine, Ser 0.68 0.57 - 1.00 mg/dL   GFR calc non Af Amer 91 >59 mL/min/1.73   GFR calc Af Amer 105 >59 mL/min/1.73   BUN/Creatinine Ratio 18 11 - 26   Sodium 138 134 - 144 mmol/L   Potassium 4.5 3.5 - 5.2 mmol/L   Chloride 96 96 - 106 mmol/L   CO2 27 18 - 29 mmol/L   Calcium 9.5 8.7 - 10.3 mg/dL   Total Protein 7.2 6.0 - 8.5 g/dL   Albumin 4.3 3.6 - 4.8 g/dL   Globulin, Total 2.9 1.5 - 4.5 g/dL   Albumin/Globulin Ratio 1.5 1.2 - 2.2   Bilirubin Total 0.3 0.0 - 1.2 mg/dL   Alkaline Phosphatase 90 39 - 117 IU/L   AST  18 0 - 40 IU/L   ALT 13 0 - 32 IU/L  Lipid panel  Result Value Ref Range   Cholesterol, Total 176 100 - 199 mg/dL   Triglycerides 94 0 - 149 mg/dL   HDL 51 >39 mg/dL   VLDL Cholesterol Cal 19 5 - 40  mg/dL   LDL Calculated 106 (H) 0 - 99 mg/dL   Chol/HDL Ratio 3.5 0.0 - 4.4 ratio units  TSH  Result Value Ref Range   TSH 2.070 0.450 - 4.500 uIU/mL      Assessment & Plan:   1. Hyperlipidemia LDL goal <130 - Lipid panel - TSH - lovastatin (MEVACOR) 20 MG tablet; Take 1 tablet (20 mg total) by mouth at bedtime.  Dispense: 90 tablet; Refill: 3  2. Generalized anxiety disorder - escitalopram (LEXAPRO) 10 MG tablet; Take 1 tablet (10 mg total) by mouth daily.  Dispense: 90 tablet; Refill: 3  3. Essential hypertension, benign - CMP14+EGFR - CBC with Differential/Platelet - Lipid panel - TSH - bisoprolol-hydrochlorothiazide (ZIAC) 10-6.25 MG tablet; Take 1 tablet by mouth daily.  Dispense: 90 tablet; Refill: 3  4. Hypothyroidism, unspecified type - TSH  5. Allergic rhinitis due to pollen, unspecified seasonality  6. Encounter for smoking cessation counseling   Current Outpatient Prescriptions:  .  bisoprolol-hydrochlorothiazide (ZIAC) 10-6.25 MG tablet, Take 1 tablet by mouth daily., Disp: 90 tablet, Rfl: 3 .  calcium gluconate 500 MG tablet, Take 1 tablet by mouth daily. , Disp: , Rfl:  .  cetirizine (ZYRTEC) 10 MG tablet, Take 10 mg by mouth daily., Disp: , Rfl:  .  cholecalciferol (VITAMIN D) 1000 units tablet, Take 2,000 Units by mouth 2 (two) times daily., Disp: , Rfl:  .  escitalopram (LEXAPRO) 10 MG tablet, Take 1 tablet (10 mg total) by mouth daily., Disp: 90 tablet, Rfl: 3 .  Flaxseed, Linseed, (FLAX SEED OIL) 1000 MG CAPS, Take 2,000 mg by mouth daily., Disp: , Rfl:  .  Glucosamine 500 MG CAPS, Take 1,000 mg by mouth 2 (two) times daily. , Disp: , Rfl:  .  ibuprofen (ADVIL,MOTRIN) 800 MG tablet, Take 1 tablet (800 mg total) by mouth every 8 (eight) hours as needed.,  Disp: 90 tablet, Rfl: 2 .  levothyroxine (SYNTHROID, LEVOTHROID) 75 MCG tablet, Take 1 tablet (75 mcg total) by mouth daily before breakfast., Disp: 90 tablet, Rfl: 3 .  lovastatin (MEVACOR) 20 MG tablet, Take 1 tablet (20 mg total) by mouth at bedtime., Disp: 90 tablet, Rfl: 3 .  Multiple Vitamins-Minerals (MULTIVITAMIN ADULT PO), Take 1 capsule by mouth daily., Disp: , Rfl:  .  Omega-3 Fatty Acids (FISH OIL) 1000 MG CAPS, Take 2 capsules by mouth daily., Disp: , Rfl:  .  Probiotic Product (PROBIOTIC ADVANCED PO), Take by mouth daily., Disp: , Rfl:  .  Turmeric 500 MG CAPS, Take 500 mg by mouth daily., Disp: , Rfl:  .  fexofenadine (ALLEGRA ALLERGY) 180 MG tablet, Take 1 tablet (180 mg total) by mouth daily., Disp: 90 tablet, Rfl: 3 .  montelukast (SINGULAIR) 10 MG tablet, Take 1 tablet (10 mg total) by mouth at bedtime., Disp: 90 tablet, Rfl: 3 .  varenicline (CHANTIX STARTING MONTH PAK) 0.5 MG X 11 & 1 MG X 42 tablet, Take one 0.5 mg tablet by mouth once daily for 3 days, then increase to one 0.5 mg tablet twice daily for 4 days, then increase to one 1 mg tablet twice daily., Disp: 53 tablet, Rfl: 5  Continue all other maintenance medications as listed above.  Follow up plan: Return in about 6 months (around 07/15/2017).  Educational handout given for smoking cessation  Terald Sleeper PA-C [facility] [address]  [place], [place] [address] [phone]   01/12/2017, 2:47 PM

## 2017-01-12 NOTE — Patient Instructions (Signed)
Coping with Quitting Smoking Quitting smoking is a physical and mental challenge. You will face cravings, withdrawal symptoms, and temptation. Before quitting, work with your health care provider to make a plan that can help you cope. Preparation can help you quit and keep you from giving in. How can I cope with cravings? Cravings usually last for 5-10 minutes. If you get through it, the craving will pass. Consider taking the following actions to help you cope with cravings:  Keep your mouth busy:  Chew sugar-free gum.  Suck on hard candies or a straw.  Brush your teeth.  Keep your hands and body busy:  Immediately change to a different activity when you feel a craving.  Squeeze or play with a ball.  Do an activity or a hobby, like making bead jewelry, practicing needlepoint, or working with wood.  Mix up your normal routine.  Take a short exercise break. Go for a quick walk or run up and down stairs.  Spend time in public places where smoking is not allowed.  Focus on doing something kind or helpful for someone else.  Call a friend or family member to talk during a craving.  Join a support group.  Call a quit line, such as 1-800-QUIT-NOW.  Talk with your health care provider about medicines that might help you cope with cravings and make quitting easier for you. How can I deal with withdrawal symptoms? Your body may experience negative effects as it tries to get used to not having nicotine in the system. These effects are called withdrawal symptoms. They may include:  Feeling hungrier than normal.  Trouble concentrating.  Irritability.  Trouble sleeping.  Feeling depressed.  Restlessness and agitation.  Craving a cigarette. 1.  To manage withdrawal symptoms:  Avoid places, people, and activities that trigger your cravings.  Remember why you want to quit.  Get plenty of sleep.  Avoid coffee and other caffeinated drinks. These may worsen some of your  symptoms. How can I handle social situations? Social situations can be difficult when you are quitting smoking, especially in the first few weeks. To manage this, you can:  Avoid parties, bars, and other social situations where people might be smoking.  Avoid alcohol.  Leave right away if you have the urge to smoke.  Explain to your family and friends that you are quitting smoking. Ask for understanding and support.  Plan activities with friends or family where smoking is not an option. What are some ways I can cope with stress? Wanting to smoke may cause stress, and stress can make you want to smoke. Find ways to manage your stress. Relaxation techniques can help. For example:  Breathe slowly and deeply, in through your nose and out through your mouth.  Listen to soothing, relaxing music.  Talk with a family member or friend about your stress.  Light a candle.  Soak in a bath or take a shower.  Think about a peaceful place. What are some ways I can prevent weight gain? Be aware that many people gain weight after they quit smoking. However, not everyone does. To keep from gaining weight, have a plan in place before you quit and stick to the plan after you quit. Your plan should include:  Having healthy snacks. When you have a craving, it may help to:  Eat plain popcorn, crunchy carrots, celery, or other cut vegetables.  Chew sugar-free gum.  Changing how you eat:  Eat small portion sizes at meals.  Eat 4-6 small   meals throughout the day instead of 1-2 large meals a day.  Be mindful when you eat. Do not watch television or do other things that might distract you as you eat.  Exercising regularly:  Make time to exercise each day. If you do not have time for a long workout, do short bouts of exercise for 5-10 minutes several times a day.  Do some form of strengthening exercise, like weight lifting, and some form of aerobic exercise, like running or swimming.  Drinking  plenty of water or other low-calorie or no-calorie drinks. Drink 6-8 glasses of water daily, or as much as instructed by your health care provider. Summary  Quitting smoking is a physical and mental challenge. You will face cravings, withdrawal symptoms, and temptation to smoke again. Preparation can help you as you go through these challenges.  You can cope with cravings by keeping your mouth busy (such as by chewing gum), keeping your body and hands busy, and making calls to family, friends, or a helpline for people who want to quit smoking.  You can cope with withdrawal symptoms by avoiding places where people smoke, avoiding drinks with caffeine, and getting plenty of rest.  Ask your health care provider about the different ways to prevent weight gain, avoid stress, and handle social situations. This information is not intended to replace advice given to you by your health care provider. Make sure you discuss any questions you have with your health care provider. Document Released: 08/12/2016 Document Revised: 08/12/2016 Document Reviewed: 08/12/2016 Elsevier Interactive Patient Education  2017 [employer]  

## 2017-01-13 ENCOUNTER — Other Ambulatory Visit: Payer: Self-pay | Admitting: Physician Assistant

## 2017-01-13 LAB — CMP14+EGFR
ALBUMIN: 4.3 g/dL (ref 3.6–4.8)
ALK PHOS: 81 IU/L (ref 39–117)
ALT: 10 IU/L (ref 0–32)
AST: 19 IU/L (ref 0–40)
Albumin/Globulin Ratio: 1.3 (ref 1.2–2.2)
BUN / CREAT RATIO: 17 (ref 12–28)
BUN: 12 mg/dL (ref 8–27)
Bilirubin Total: 0.3 mg/dL (ref 0.0–1.2)
CALCIUM: 9.3 mg/dL (ref 8.7–10.3)
CO2: 27 mmol/L (ref 18–29)
CREATININE: 0.72 mg/dL (ref 0.57–1.00)
Chloride: 94 mmol/L — ABNORMAL LOW (ref 96–106)
GFR calc Af Amer: 100 mL/min/{1.73_m2} (ref >59)
GFR, EST NON AFRICAN AMERICAN: 87 mL/min/{1.73_m2} (ref >59)
GLOBULIN, TOTAL: 3.2 g/dL (ref 1.5–4.5)
GLUCOSE: 94 mg/dL (ref 65–99)
Potassium: 4.8 mmol/L (ref 3.5–5.2)
Sodium: 133 mmol/L — ABNORMAL LOW (ref 134–144)
Total Protein: 7.5 g/dL (ref 6.0–8.5)

## 2017-01-13 LAB — LIPID PANEL
CHOL/HDL RATIO: 3.5 ratio (ref 0.0–4.4)
Cholesterol, Total: 163 mg/dL (ref 100–199)
HDL: 47 mg/dL (ref >39)
LDL CALC: 96 mg/dL (ref 0–99)
TRIGLYCERIDES: 101 mg/dL (ref 0–149)
VLDL Cholesterol Cal: 20 mg/dL (ref 5–40)

## 2017-01-13 LAB — CBC WITH DIFFERENTIAL/PLATELET
BASOS: 0 %
Basophils Absolute: 0 10*3/uL (ref 0.0–0.2)
EOS (ABSOLUTE): 0.2 10*3/uL (ref 0.0–0.4)
EOS: 2 %
HEMATOCRIT: 41.5 % (ref 34.0–46.6)
HEMOGLOBIN: 14 g/dL (ref 11.1–15.9)
IMMATURE GRANULOCYTES: 0 %
Immature Grans (Abs): 0 10*3/uL (ref 0.0–0.1)
LYMPHS ABS: 3.2 10*3/uL — AB (ref 0.7–3.1)
Lymphs: 31 %
MCH: 32 pg (ref 26.6–33.0)
MCHC: 33.7 g/dL (ref 31.5–35.7)
MCV: 95 fL (ref 79–97)
MONOCYTES: 7 %
Monocytes Absolute: 0.7 10*3/uL (ref 0.1–0.9)
NEUTROS PCT: 60 %
Neutrophils Absolute: 6 10*3/uL (ref 1.4–7.0)
Platelets: 364 10*3/uL (ref 150–379)
RBC: 4.38 x10E6/uL (ref 3.77–5.28)
RDW: 12.8 % (ref 12.3–15.4)
WBC: 10.1 10*3/uL (ref 3.4–10.8)

## 2017-01-13 LAB — TSH: TSH: 3.24 u[IU]/mL (ref 0.450–4.500)

## 2017-01-13 MED ORDER — LEVOTHYROXINE SODIUM 88 MCG PO TABS: 88.0000 ug | ORAL_TABLET | Freq: Every day | ORAL | 1 refills | Status: DC

## 2017-03-08 DIAGNOSIS — M9901 Segmental and somatic dysfunction of cervical region: Secondary | ICD-10-CM | POA: Diagnosis not present

## 2017-03-08 DIAGNOSIS — M47816 Spondylosis without myelopathy or radiculopathy, lumbar region: Secondary | ICD-10-CM | POA: Diagnosis not present

## 2017-03-08 DIAGNOSIS — M9902 Segmental and somatic dysfunction of thoracic region: Secondary | ICD-10-CM | POA: Diagnosis not present

## 2017-03-08 DIAGNOSIS — M9903 Segmental and somatic dysfunction of lumbar region: Secondary | ICD-10-CM | POA: Diagnosis not present

## 2017-03-08 DIAGNOSIS — M47812 Spondylosis without myelopathy or radiculopathy, cervical region: Secondary | ICD-10-CM | POA: Diagnosis not present

## 2017-03-13 DIAGNOSIS — M47812 Spondylosis without myelopathy or radiculopathy, cervical region: Secondary | ICD-10-CM | POA: Diagnosis not present

## 2017-03-13 DIAGNOSIS — M9902 Segmental and somatic dysfunction of thoracic region: Secondary | ICD-10-CM | POA: Diagnosis not present

## 2017-03-13 DIAGNOSIS — M9903 Segmental and somatic dysfunction of lumbar region: Secondary | ICD-10-CM | POA: Diagnosis not present

## 2017-03-13 DIAGNOSIS — M9901 Segmental and somatic dysfunction of cervical region: Secondary | ICD-10-CM | POA: Diagnosis not present

## 2017-03-13 DIAGNOSIS — M47816 Spondylosis without myelopathy or radiculopathy, lumbar region: Secondary | ICD-10-CM | POA: Diagnosis not present

## 2017-03-16 DIAGNOSIS — M9901 Segmental and somatic dysfunction of cervical region: Secondary | ICD-10-CM | POA: Diagnosis not present

## 2017-03-16 DIAGNOSIS — M9902 Segmental and somatic dysfunction of thoracic region: Secondary | ICD-10-CM | POA: Diagnosis not present

## 2017-03-16 DIAGNOSIS — M47812 Spondylosis without myelopathy or radiculopathy, cervical region: Secondary | ICD-10-CM | POA: Diagnosis not present

## 2017-03-16 DIAGNOSIS — M47816 Spondylosis without myelopathy or radiculopathy, lumbar region: Secondary | ICD-10-CM | POA: Diagnosis not present

## 2017-03-16 DIAGNOSIS — M9903 Segmental and somatic dysfunction of lumbar region: Secondary | ICD-10-CM | POA: Diagnosis not present

## 2017-03-20 DIAGNOSIS — M9901 Segmental and somatic dysfunction of cervical region: Secondary | ICD-10-CM | POA: Diagnosis not present

## 2017-03-20 DIAGNOSIS — M47812 Spondylosis without myelopathy or radiculopathy, cervical region: Secondary | ICD-10-CM | POA: Diagnosis not present

## 2017-03-20 DIAGNOSIS — M47816 Spondylosis without myelopathy or radiculopathy, lumbar region: Secondary | ICD-10-CM | POA: Diagnosis not present

## 2017-03-20 DIAGNOSIS — M9902 Segmental and somatic dysfunction of thoracic region: Secondary | ICD-10-CM | POA: Diagnosis not present

## 2017-03-20 DIAGNOSIS — M9903 Segmental and somatic dysfunction of lumbar region: Secondary | ICD-10-CM | POA: Diagnosis not present

## 2017-03-23 DIAGNOSIS — M9902 Segmental and somatic dysfunction of thoracic region: Secondary | ICD-10-CM | POA: Diagnosis not present

## 2017-03-23 DIAGNOSIS — M9901 Segmental and somatic dysfunction of cervical region: Secondary | ICD-10-CM | POA: Diagnosis not present

## 2017-03-23 DIAGNOSIS — M47812 Spondylosis without myelopathy or radiculopathy, cervical region: Secondary | ICD-10-CM | POA: Diagnosis not present

## 2017-03-23 DIAGNOSIS — M47816 Spondylosis without myelopathy or radiculopathy, lumbar region: Secondary | ICD-10-CM | POA: Diagnosis not present

## 2017-03-23 DIAGNOSIS — M9903 Segmental and somatic dysfunction of lumbar region: Secondary | ICD-10-CM | POA: Diagnosis not present

## 2017-03-30 DIAGNOSIS — M9901 Segmental and somatic dysfunction of cervical region: Secondary | ICD-10-CM | POA: Diagnosis not present

## 2017-03-30 DIAGNOSIS — M9903 Segmental and somatic dysfunction of lumbar region: Secondary | ICD-10-CM | POA: Diagnosis not present

## 2017-03-30 DIAGNOSIS — M47812 Spondylosis without myelopathy or radiculopathy, cervical region: Secondary | ICD-10-CM | POA: Diagnosis not present

## 2017-03-30 DIAGNOSIS — M47816 Spondylosis without myelopathy or radiculopathy, lumbar region: Secondary | ICD-10-CM | POA: Diagnosis not present

## 2017-03-30 DIAGNOSIS — M9902 Segmental and somatic dysfunction of thoracic region: Secondary | ICD-10-CM | POA: Diagnosis not present

## 2017-04-05 ENCOUNTER — Encounter: Payer: Self-pay | Admitting: Physician Assistant

## 2017-04-05 ENCOUNTER — Ambulatory Visit (INDEPENDENT_AMBULATORY_CARE_PROVIDER_SITE_OTHER): Payer: Medicare Other | Admitting: Physician Assistant

## 2017-04-05 VITALS — BP 145/85 | HR 70 | Temp 97.4°F | Resp 18 | Ht 63.5 in | Wt 136.6 lb

## 2017-04-05 DIAGNOSIS — J0100 Acute maxillary sinusitis, unspecified: Secondary | ICD-10-CM

## 2017-04-05 DIAGNOSIS — M9903 Segmental and somatic dysfunction of lumbar region: Secondary | ICD-10-CM | POA: Diagnosis not present

## 2017-04-05 DIAGNOSIS — M9902 Segmental and somatic dysfunction of thoracic region: Secondary | ICD-10-CM | POA: Diagnosis not present

## 2017-04-05 DIAGNOSIS — M47812 Spondylosis without myelopathy or radiculopathy, cervical region: Secondary | ICD-10-CM | POA: Diagnosis not present

## 2017-04-05 DIAGNOSIS — M9901 Segmental and somatic dysfunction of cervical region: Secondary | ICD-10-CM | POA: Diagnosis not present

## 2017-04-05 DIAGNOSIS — M47816 Spondylosis without myelopathy or radiculopathy, lumbar region: Secondary | ICD-10-CM | POA: Diagnosis not present

## 2017-04-05 MED ORDER — PREDNISONE 10 MG (21) PO TBPK: ORAL_TABLET | ORAL | 0 refills | Status: DC

## 2017-04-05 MED ORDER — AZITHROMYCIN 250 MG PO TABS: ORAL_TABLET | ORAL | 0 refills | Status: DC

## 2017-04-05 NOTE — Patient Instructions (Signed)
In a few days you may receive a survey in the mail or online from [employer] regarding your visit with us today. Please take a moment to fill this out. Your feedback is very important to our whole office. It can help us better understand your needs as well as improve your experience and satisfaction. Thank you for taking your time to complete it. We care about you.  Haydon Kalmar, PA-C  

## 2017-04-07 ENCOUNTER — Encounter: Payer: Self-pay | Admitting: Physician Assistant

## 2017-04-07 NOTE — Progress Notes (Signed)
BP (!) 145/85   Pulse 70   Temp (!) 97.4 F (36.3 C) (Oral)   Resp 18   Ht 5' 3.5" (1.613 m)   Wt 136 lb 9.6 oz (62 kg)   SpO2 95%   BMI 23.82 kg/m    Subjective:    Patient ID: Tammie Martin, female    DOB: November 08, 1950, 66 y.o.   MRN: [id number]  HPI: Tammie Martin is a 66 y.o. female presenting on 04/05/2017 for Cough (3 weeks); Nasal Congestion; and Hoarse  This patient has had many days of sinus headache and postnasal drainage. There is copious drainage at times. Denies any fever at this time. There has been a history of sinus infections in the past.  No history of sinus surgery. There is cough at night. It has become more prevalent in recent days.  Relevant past medical, surgical, family and social history reviewed and updated as indicated. Allergies and medications reviewed and updated.  Past Medical History:  Diagnosis Date  . Depression   . DJD (degenerative joint disease)   . Hyperlipidemia   . Hypertension   . Thyroid disease     Past Surgical History:  Procedure Laterality Date  . ABDOMINAL HYSTERECTOMY    . APPENDECTOMY    . BREAST ENHANCEMENT SURGERY    . TONSILLECTOMY      Review of Systems  Constitutional: Positive for chills and fatigue. Negative for activity change and appetite change.  HENT: Positive for congestion, postnasal drip and sore throat.   Eyes: Negative.   Respiratory: Positive for cough and wheezing.   Cardiovascular: Negative.  Negative for chest pain, palpitations and leg swelling.  Gastrointestinal: Negative.   Genitourinary: Negative.   Musculoskeletal: Negative.   Skin: Negative.   Neurological: Positive for headaches.    Allergies as of 04/05/2017      Reactions   Penicillins Hives   Singulair [montelukast Sodium]    headache   Codeine Rash      Medication List       Accurate as of 04/05/17 11:59 PM. Always use your most recent med list.          azithromycin 250 MG tablet Commonly known as:  ZITHROMAX  Z-PAK Take 5 days as directed   bisoprolol-hydrochlorothiazide 10-6.25 MG tablet Commonly known as:  ZIAC Take 1 tablet by mouth daily.   calcium gluconate 500 MG tablet Take 1 tablet by mouth daily.   cetirizine 10 MG tablet Commonly known as:  ZYRTEC Take 10 mg by mouth daily.   cholecalciferol 1000 units tablet Commonly known as:  VITAMIN D Take 2,000 Units by mouth 2 (two) times daily.   escitalopram 10 MG tablet Commonly known as:  LEXAPRO Take 1 tablet (10 mg total) by mouth daily.   fexofenadine 180 MG tablet Commonly known as:  ALLEGRA ALLERGY Take 1 tablet (180 mg total) by mouth daily.   Fish Oil 1000 MG Caps Take 2 capsules by mouth daily.   Flax Seed Oil 1000 MG Caps Take 2,000 mg by mouth daily.   Glucosamine 500 MG Caps Take 1,000 mg by mouth 2 (two) times daily.   ibuprofen 800 MG tablet Commonly known as:  ADVIL,MOTRIN Take 1 tablet (800 mg total) by mouth every 8 (eight) hours as needed.   levothyroxine 88 MCG tablet Commonly known as:  SYNTHROID, LEVOTHROID Take 1 tablet (88 mcg total) by mouth daily before breakfast.   lovastatin 20 MG tablet Commonly known as:  MEVACOR Take 1 tablet (  20 mg total) by mouth at bedtime.   MULTIVITAMIN ADULT PO Take 1 capsule by mouth daily.   predniSONE 10 MG (21) Tbpk tablet Commonly known as:  STERAPRED UNI-PAK 21 TAB As directed x 6 days   PROBIOTIC ADVANCED PO Take by mouth daily.   Turmeric 500 MG Caps Take 500 mg by mouth daily.   varenicline 0.5 MG X 11 & 1 MG X 42 tablet Commonly known as:  CHANTIX STARTING MONTH PAK Take one 0.5 mg tablet by mouth once daily for 3 days, then increase to one 0.5 mg tablet twice daily for 4 days, then increase to one 1 mg tablet twice daily.          Objective:    BP (!) 145/85   Pulse 70   Temp (!) 97.4 F (36.3 C) (Oral)   Resp 18   Ht 5' 3.5" (1.613 m)   Wt 136 lb 9.6 oz (62 kg)   SpO2 95%   BMI 23.82 kg/m   Allergies  Allergen Reactions  .  Penicillins Hives  . Singulair [Montelukast Sodium]     headache  . Codeine Rash    Physical Exam  Constitutional: She is oriented to person, place, and time. She appears well-developed and well-nourished.  HENT:  Head: Normocephalic and atraumatic.  Right Ear: Tympanic membrane and external ear normal. No middle ear effusion.  Left Ear: Tympanic membrane and external ear normal.  No middle ear effusion.  Nose: Mucosal edema and rhinorrhea present. Right sinus exhibits maxillary sinus tenderness. Left sinus exhibits maxillary sinus tenderness.  Mouth/Throat: Uvula is midline. Posterior oropharyngeal erythema present.  Eyes: Pupils are equal, round, and reactive to light. Conjunctivae and EOM are normal. Right eye exhibits no discharge. Left eye exhibits no discharge.  Neck: Normal range of motion.  Cardiovascular: Normal rate, regular rhythm and normal heart sounds.   Pulmonary/Chest: Effort normal and breath sounds normal. No respiratory distress. She has no wheezes.  Abdominal: Soft.  Lymphadenopathy:    She has no cervical adenopathy.  Neurological: She is alert and oriented to person, place, and time.  Skin: Skin is warm and dry.  Psychiatric: She has a normal mood and affect.        Assessment & Plan:   1. Acute non-recurrent maxillary sinusitis - predniSONE (STERAPRED UNI-PAK 21 TAB) 10 MG (21) TBPK tablet; As directed x 6 days  Dispense: 21 tablet; Refill: 0 - azithromycin (ZITHROMAX Z-PAK) 250 MG tablet; Take 5 days as directed  Dispense: 6 each; Refill: 0    Current Outpatient Prescriptions:  .  bisoprolol-hydrochlorothiazide (ZIAC) 10-6.25 MG tablet, Take 1 tablet by mouth daily., Disp: 90 tablet, Rfl: 3 .  calcium gluconate 500 MG tablet, Take 1 tablet by mouth daily. , Disp: , Rfl:  .  cetirizine (ZYRTEC) 10 MG tablet, Take 10 mg by mouth daily., Disp: , Rfl:  .  cholecalciferol (VITAMIN D) 1000 units tablet, Take 2,000 Units by mouth 2 (two) times daily., Disp: ,  Rfl:  .  escitalopram (LEXAPRO) 10 MG tablet, Take 1 tablet (10 mg total) by mouth daily., Disp: 90 tablet, Rfl: 3 .  fexofenadine (ALLEGRA ALLERGY) 180 MG tablet, Take 1 tablet (180 mg total) by mouth daily., Disp: 90 tablet, Rfl: 3 .  Flaxseed, Linseed, (FLAX SEED OIL) 1000 MG CAPS, Take 2,000 mg by mouth daily., Disp: , Rfl:  .  Glucosamine 500 MG CAPS, Take 1,000 mg by mouth 2 (two) times daily. , Disp: , Rfl:  .  ibuprofen (ADVIL,MOTRIN) 800 MG tablet, Take 1 tablet (800 mg total) by mouth every 8 (eight) hours as needed., Disp: 90 tablet, Rfl: 2 .  levothyroxine (SYNTHROID, LEVOTHROID) 88 MCG tablet, Take 1 tablet (88 mcg total) by mouth daily before breakfast., Disp: 90 tablet, Rfl: 1 .  lovastatin (MEVACOR) 20 MG tablet, Take 1 tablet (20 mg total) by mouth at bedtime., Disp: 90 tablet, Rfl: 3 .  Multiple Vitamins-Minerals (MULTIVITAMIN ADULT PO), Take 1 capsule by mouth daily., Disp: , Rfl:  .  Omega-3 Fatty Acids (FISH OIL) 1000 MG CAPS, Take 2 capsules by mouth daily., Disp: , Rfl:  .  Probiotic Product (PROBIOTIC ADVANCED PO), Take by mouth daily., Disp: , Rfl:  .  Turmeric 500 MG CAPS, Take 500 mg by mouth daily., Disp: , Rfl:  .  varenicline (CHANTIX STARTING MONTH PAK) 0.5 MG X 11 & 1 MG X 42 tablet, Take one 0.5 mg tablet by mouth once daily for 3 days, then increase to one 0.5 mg tablet twice daily for 4 days, then increase to one 1 mg tablet twice daily., Disp: 53 tablet, Rfl: 5 .  azithromycin (ZITHROMAX Z-PAK) 250 MG tablet, Take 5 days as directed, Disp: 6 each, Rfl: 0 .  predniSONE (STERAPRED UNI-PAK 21 TAB) 10 MG (21) TBPK tablet, As directed x 6 days, Disp: 21 tablet, Rfl: 0 Continue all other maintenance medications as listed above.  Follow up plan: Return if symptoms worsen or fail to improve.  Educational handout given for [place] PA-C [facility] [address]  [place], [place] [address] [phone]   04/07/2017, 2:50  PM

## 2017-04-12 DIAGNOSIS — M9901 Segmental and somatic dysfunction of cervical region: Secondary | ICD-10-CM | POA: Diagnosis not present

## 2017-04-12 DIAGNOSIS — M47812 Spondylosis without myelopathy or radiculopathy, cervical region: Secondary | ICD-10-CM | POA: Diagnosis not present

## 2017-04-12 DIAGNOSIS — M9902 Segmental and somatic dysfunction of thoracic region: Secondary | ICD-10-CM | POA: Diagnosis not present

## 2017-04-12 DIAGNOSIS — M47816 Spondylosis without myelopathy or radiculopathy, lumbar region: Secondary | ICD-10-CM | POA: Diagnosis not present

## 2017-04-12 DIAGNOSIS — M9903 Segmental and somatic dysfunction of lumbar region: Secondary | ICD-10-CM | POA: Diagnosis not present

## 2017-04-19 DIAGNOSIS — M9902 Segmental and somatic dysfunction of thoracic region: Secondary | ICD-10-CM | POA: Diagnosis not present

## 2017-04-19 DIAGNOSIS — M47816 Spondylosis without myelopathy or radiculopathy, lumbar region: Secondary | ICD-10-CM | POA: Diagnosis not present

## 2017-04-19 DIAGNOSIS — M9903 Segmental and somatic dysfunction of lumbar region: Secondary | ICD-10-CM | POA: Diagnosis not present

## 2017-04-19 DIAGNOSIS — M9901 Segmental and somatic dysfunction of cervical region: Secondary | ICD-10-CM | POA: Diagnosis not present

## 2017-04-19 DIAGNOSIS — M47812 Spondylosis without myelopathy or radiculopathy, cervical region: Secondary | ICD-10-CM | POA: Diagnosis not present

## 2017-05-03 DIAGNOSIS — M9903 Segmental and somatic dysfunction of lumbar region: Secondary | ICD-10-CM | POA: Diagnosis not present

## 2017-05-03 DIAGNOSIS — M47812 Spondylosis without myelopathy or radiculopathy, cervical region: Secondary | ICD-10-CM | POA: Diagnosis not present

## 2017-05-03 DIAGNOSIS — M47816 Spondylosis without myelopathy or radiculopathy, lumbar region: Secondary | ICD-10-CM | POA: Diagnosis not present

## 2017-05-03 DIAGNOSIS — M9902 Segmental and somatic dysfunction of thoracic region: Secondary | ICD-10-CM | POA: Diagnosis not present

## 2017-05-03 DIAGNOSIS — M9901 Segmental and somatic dysfunction of cervical region: Secondary | ICD-10-CM | POA: Diagnosis not present

## 2017-05-17 DIAGNOSIS — M9903 Segmental and somatic dysfunction of lumbar region: Secondary | ICD-10-CM | POA: Diagnosis not present

## 2017-05-17 DIAGNOSIS — M9902 Segmental and somatic dysfunction of thoracic region: Secondary | ICD-10-CM | POA: Diagnosis not present

## 2017-05-17 DIAGNOSIS — M9901 Segmental and somatic dysfunction of cervical region: Secondary | ICD-10-CM | POA: Diagnosis not present

## 2017-05-17 DIAGNOSIS — M47816 Spondylosis without myelopathy or radiculopathy, lumbar region: Secondary | ICD-10-CM | POA: Diagnosis not present

## 2017-05-17 DIAGNOSIS — M47812 Spondylosis without myelopathy or radiculopathy, cervical region: Secondary | ICD-10-CM | POA: Diagnosis not present

## 2017-06-05 ENCOUNTER — Other Ambulatory Visit: Payer: Self-pay | Admitting: Physician Assistant

## 2017-06-14 DIAGNOSIS — M9902 Segmental and somatic dysfunction of thoracic region: Secondary | ICD-10-CM | POA: Diagnosis not present

## 2017-06-14 DIAGNOSIS — M47816 Spondylosis without myelopathy or radiculopathy, lumbar region: Secondary | ICD-10-CM | POA: Diagnosis not present

## 2017-06-14 DIAGNOSIS — M9901 Segmental and somatic dysfunction of cervical region: Secondary | ICD-10-CM | POA: Diagnosis not present

## 2017-06-14 DIAGNOSIS — M9903 Segmental and somatic dysfunction of lumbar region: Secondary | ICD-10-CM | POA: Diagnosis not present

## 2017-06-14 DIAGNOSIS — M47812 Spondylosis without myelopathy or radiculopathy, cervical region: Secondary | ICD-10-CM | POA: Diagnosis not present

## 2017-07-05 ENCOUNTER — Ambulatory Visit (INDEPENDENT_AMBULATORY_CARE_PROVIDER_SITE_OTHER): Payer: Medicare Other

## 2017-07-05 DIAGNOSIS — Z23 Encounter for immunization: Secondary | ICD-10-CM

## 2017-07-19 DIAGNOSIS — M47816 Spondylosis without myelopathy or radiculopathy, lumbar region: Secondary | ICD-10-CM | POA: Diagnosis not present

## 2017-07-19 DIAGNOSIS — M9901 Segmental and somatic dysfunction of cervical region: Secondary | ICD-10-CM | POA: Diagnosis not present

## 2017-07-19 DIAGNOSIS — M47812 Spondylosis without myelopathy or radiculopathy, cervical region: Secondary | ICD-10-CM | POA: Diagnosis not present

## 2017-07-19 DIAGNOSIS — M9903 Segmental and somatic dysfunction of lumbar region: Secondary | ICD-10-CM | POA: Diagnosis not present

## 2017-07-19 DIAGNOSIS — M9902 Segmental and somatic dysfunction of thoracic region: Secondary | ICD-10-CM | POA: Diagnosis not present

## 2017-08-18 DIAGNOSIS — M47816 Spondylosis without myelopathy or radiculopathy, lumbar region: Secondary | ICD-10-CM | POA: Diagnosis not present

## 2017-08-18 DIAGNOSIS — M47812 Spondylosis without myelopathy or radiculopathy, cervical region: Secondary | ICD-10-CM | POA: Diagnosis not present

## 2017-08-18 DIAGNOSIS — M9902 Segmental and somatic dysfunction of thoracic region: Secondary | ICD-10-CM | POA: Diagnosis not present

## 2017-08-18 DIAGNOSIS — M9903 Segmental and somatic dysfunction of lumbar region: Secondary | ICD-10-CM | POA: Diagnosis not present

## 2017-08-18 DIAGNOSIS — M9901 Segmental and somatic dysfunction of cervical region: Secondary | ICD-10-CM | POA: Diagnosis not present

## 2017-10-24 DIAGNOSIS — M9901 Segmental and somatic dysfunction of cervical region: Secondary | ICD-10-CM | POA: Diagnosis not present

## 2017-10-24 DIAGNOSIS — M546 Pain in thoracic spine: Secondary | ICD-10-CM | POA: Diagnosis not present

## 2017-10-24 DIAGNOSIS — M47812 Spondylosis without myelopathy or radiculopathy, cervical region: Secondary | ICD-10-CM | POA: Diagnosis not present

## 2017-11-23 ENCOUNTER — Encounter: Payer: Self-pay | Admitting: Physician Assistant

## 2017-11-23 ENCOUNTER — Ambulatory Visit (INDEPENDENT_AMBULATORY_CARE_PROVIDER_SITE_OTHER): Payer: Medicare Other | Admitting: Physician Assistant

## 2017-11-23 VITALS — BP 114/65 | HR 51 | Temp 97.5°F | Ht 63.5 in | Wt 131.0 lb

## 2017-11-23 DIAGNOSIS — I10 Essential (primary) hypertension: Secondary | ICD-10-CM | POA: Diagnosis not present

## 2017-11-23 DIAGNOSIS — J45998 Other asthma: Secondary | ICD-10-CM | POA: Diagnosis not present

## 2017-11-23 DIAGNOSIS — M7022 Olecranon bursitis, left elbow: Secondary | ICD-10-CM | POA: Diagnosis not present

## 2017-11-23 DIAGNOSIS — E785 Hyperlipidemia, unspecified: Secondary | ICD-10-CM

## 2017-11-23 DIAGNOSIS — F411 Generalized anxiety disorder: Secondary | ICD-10-CM

## 2017-11-23 MED ORDER — IBUPROFEN 800 MG PO TABS: 800.0000 mg | ORAL_TABLET | Freq: Three times a day (TID) | ORAL | 3 refills | Status: DC | PRN

## 2017-11-23 MED ORDER — LOVASTATIN 20 MG PO TABS: 20.0000 mg | ORAL_TABLET | Freq: Every day | ORAL | 3 refills | Status: DC

## 2017-11-23 MED ORDER — BISOPROLOL-HYDROCHLOROTHIAZIDE 10-6.25 MG PO TABS: 1.0000 | ORAL_TABLET | Freq: Every day | ORAL | 3 refills | Status: DC

## 2017-11-23 MED ORDER — ESCITALOPRAM OXALATE 10 MG PO TABS: 10.0000 mg | ORAL_TABLET | Freq: Every day | ORAL | 3 refills | Status: DC

## 2017-11-24 LAB — LIPID PANEL
Chol/HDL Ratio: 5.4 ratio — ABNORMAL HIGH (ref 0.0–4.4)
Cholesterol, Total: 222 mg/dL — ABNORMAL HIGH (ref 100–199)
HDL: 41 mg/dL (ref >39)
LDL CALC: 156 mg/dL — AB (ref 0–99)
Triglycerides: 124 mg/dL (ref 0–149)
VLDL Cholesterol Cal: 25 mg/dL (ref 5–40)

## 2017-11-24 LAB — CMP14+EGFR
ALBUMIN: 4.3 g/dL (ref 3.6–4.8)
ALK PHOS: 96 IU/L (ref 39–117)
ALT: 8 IU/L (ref 0–32)
AST: 18 IU/L (ref 0–40)
Albumin/Globulin Ratio: 1.4 (ref 1.2–2.2)
BILIRUBIN TOTAL: 0.3 mg/dL (ref 0.0–1.2)
BUN / CREAT RATIO: 20 (ref 12–28)
BUN: 11 mg/dL (ref 8–27)
CHLORIDE: 97 mmol/L (ref 96–106)
CO2: 24 mmol/L (ref 20–29)
Calcium: 9.2 mg/dL (ref 8.7–10.3)
Creatinine, Ser: 0.54 mg/dL — ABNORMAL LOW (ref 0.57–1.00)
GFR calc non Af Amer: 97 mL/min/{1.73_m2} (ref >59)
GFR, EST AFRICAN AMERICAN: 112 mL/min/{1.73_m2} (ref >59)
GLOBULIN, TOTAL: 3 g/dL (ref 1.5–4.5)
Glucose: 89 mg/dL (ref 65–99)
Potassium: 4.4 mmol/L (ref 3.5–5.2)
SODIUM: 136 mmol/L (ref 134–144)
TOTAL PROTEIN: 7.3 g/dL (ref 6.0–8.5)

## 2017-11-24 LAB — CBC WITH DIFFERENTIAL/PLATELET
BASOS: 0 %
Basophils Absolute: 0 10*3/uL (ref 0.0–0.2)
EOS (ABSOLUTE): 0.2 10*3/uL (ref 0.0–0.4)
EOS: 2 %
HEMATOCRIT: 41.6 % (ref 34.0–46.6)
Hemoglobin: 13.8 g/dL (ref 11.1–15.9)
Immature Grans (Abs): 0 10*3/uL (ref 0.0–0.1)
Immature Granulocytes: 0 %
LYMPHS ABS: 3.9 10*3/uL — AB (ref 0.7–3.1)
Lymphs: 40 %
MCH: 31.8 pg (ref 26.6–33.0)
MCHC: 33.2 g/dL (ref 31.5–35.7)
MCV: 96 fL (ref 79–97)
MONOS ABS: 0.7 10*3/uL (ref 0.1–0.9)
Monocytes: 7 %
Neutrophils Absolute: 5 10*3/uL (ref 1.4–7.0)
Neutrophils: 51 %
PLATELETS: 375 10*3/uL (ref 150–379)
RBC: 4.34 x10E6/uL (ref 3.77–5.28)
RDW: 12.8 % (ref 12.3–15.4)
WBC: 9.8 10*3/uL (ref 3.4–10.8)

## 2017-11-24 LAB — THYROID PANEL WITH TSH
Free Thyroxine Index: 3.9 (ref 1.2–4.9)
T3 UPTAKE RATIO: 46 % — AB (ref 24–39)
T4 TOTAL: 8.4 ug/dL (ref 4.5–12.0)
TSH: 3 u[IU]/mL (ref 0.450–4.500)

## 2017-11-26 DIAGNOSIS — M7022 Olecranon bursitis, left elbow: Secondary | ICD-10-CM | POA: Insufficient documentation

## 2017-11-26 DIAGNOSIS — J9801 Acute bronchospasm: Secondary | ICD-10-CM | POA: Insufficient documentation

## 2017-11-26 NOTE — Progress Notes (Signed)
BP 114/65   Pulse (!) 51   Temp (!) 97.5 F (36.4 C) (Oral)   Ht 5' 3.5" (1.613 m)   Wt 131 lb (59.4 kg)   BMI 22.84 kg/m    Subjective:    Patient ID: Tammie Martin, female    DOB: [birth year], 67 y.o.   MRN: [id number]  HPI: Tammie Martin is a 67 y.o. female presenting on 11/23/2017 for Follow-up (6 month )  This patient comes in for periodic recheck on medications and conditions including **generalized anxiety, essential hypertension, chronic left elbow pain, hyperlipidemia, well controlled.  Overall she is doing very well.  She is not having any difficulty with her medications.  She does need refills.  She is done very well this spring with her asthma and not had to use her rescue inhaler much at all.*.   All medications are reviewed today. There are no reports of any problems with the medications. All of the medical conditions are reviewed and updated.  Lab work is reviewed and will be ordered as medically necessary. There are no new problems reported with today's visit.   Past Medical History:  Diagnosis Date  . Depression   . DJD (degenerative joint disease)   . Hyperlipidemia   . Hypertension   . Thyroid disease    Relevant past medical, surgical, family and social history reviewed and updated as indicated. Interim medical history since our last visit reviewed. Allergies and medications reviewed and updated. DATA REVIEWED: CHART IN EPIC  Family History reviewed for pertinent findings.  Review of Systems  Constitutional: Negative.   HENT: Negative.   Eyes: Negative.   Respiratory: Negative.   Gastrointestinal: Negative.   Genitourinary: Negative.     Allergies as of 11/23/2017      Reactions   Penicillins Hives   Singulair [montelukast Sodium]    headache   Codeine Rash      Medication List        Accurate as of 11/23/17 11:59 PM. Always use your most recent med list.          bisoprolol-hydrochlorothiazide 10-6.25 MG tablet Commonly known as:   ZIAC Take 1 tablet by mouth daily.   calcium gluconate 500 MG tablet Take 1 tablet by mouth daily.   cetirizine 10 MG tablet Commonly known as:  ZYRTEC Take 10 mg by mouth daily.   cholecalciferol 1000 units tablet Commonly known as:  VITAMIN D Take 2,000 Units by mouth 2 (two) times daily.   escitalopram 10 MG tablet Commonly known as:  LEXAPRO Take 1 tablet (10 mg total) by mouth daily.   fexofenadine 180 MG tablet Commonly known as:  ALLEGRA ALLERGY Take 1 tablet (180 mg total) by mouth daily.   Fish Oil 1000 MG Caps Take 2 capsules by mouth daily.   Flax Seed Oil 1000 MG Caps Take 2,000 mg by mouth daily.   Glucosamine 500 MG Caps Take 1,000 mg by mouth 2 (two) times daily.   ibuprofen 800 MG tablet Commonly known as:  ADVIL,MOTRIN Take 1 tablet (800 mg total) by mouth every 8 (eight) hours as needed.   levothyroxine 88 MCG tablet Commonly known as:  SYNTHROID, LEVOTHROID TAKE 1 TABLET EVERY DAY BEFORE BREAKFAST   lovastatin 20 MG tablet Commonly known as:  MEVACOR Take 1 tablet (20 mg total) by mouth at bedtime.   MULTIVITAMIN ADULT PO Take 1 capsule by mouth daily.   PROBIOTIC ADVANCED PO Take by mouth daily.   Turmeric 500 MG  Caps Take 500 mg by mouth daily.          Objective:    BP 114/65   Pulse (!) 51   Temp (!) 97.5 F (36.4 C) (Oral)   Ht 5' 3.5" (1.613 m)   Wt 131 lb (59.4 kg)   BMI 22.84 kg/m   Allergies  Allergen Reactions  . Penicillins Hives  . Singulair [Montelukast Sodium]     headache  . Codeine Rash    Wt Readings from Last 3 Encounters:  11/23/17 131 lb (59.4 kg)  04/05/17 136 lb 9.6 oz (62 kg)  01/12/17 134 lb (60.8 kg)    Physical Exam  Constitutional: She is oriented to person, place, and time. She appears well-developed and well-nourished.  HENT:  Head: Normocephalic and atraumatic.  Eyes: Pupils are equal, round, and reactive to light. Conjunctivae and EOM are normal.  Cardiovascular: Normal rate, regular  rhythm, normal heart sounds and intact distal pulses.  Pulmonary/Chest: Effort normal and breath sounds normal.  Abdominal: Soft. Bowel sounds are normal.  Neurological: She is alert and oriented to person, place, and time. She has normal reflexes.  Skin: Skin is warm and dry. No rash noted.  Psychiatric: She has a normal mood and affect. Her behavior is normal. Judgment and thought content normal.  Nursing note and vitals reviewed.   Results for orders placed or performed in visit on 11/23/17  CMP14+EGFR  Result Value Ref Range   Glucose 89 65 - 99 mg/dL   BUN 11 8 - 27 mg/dL   Creatinine, Ser 0.54 (L) 0.57 - 1.00 mg/dL   GFR calc non Af Amer 97 >59 mL/min/1.73   GFR calc Af Amer 112 >59 mL/min/1.73   BUN/Creatinine Ratio 20 12 - 28   Sodium 136 134 - 144 mmol/L   Potassium 4.4 3.5 - 5.2 mmol/L   Chloride 97 96 - 106 mmol/L   CO2 24 20 - 29 mmol/L   Calcium 9.2 8.7 - 10.3 mg/dL   Total Protein 7.3 6.0 - 8.5 g/dL   Albumin 4.3 3.6 - 4.8 g/dL   Globulin, Total 3.0 1.5 - 4.5 g/dL   Albumin/Globulin Ratio 1.4 1.2 - 2.2   Bilirubin Total 0.3 0.0 - 1.2 mg/dL   Alkaline Phosphatase 96 39 - 117 IU/L   AST 18 0 - 40 IU/L   ALT 8 0 - 32 IU/L  Lipid panel  Result Value Ref Range   Cholesterol, Total 222 (H) 100 - 199 mg/dL   Triglycerides 124 0 - 149 mg/dL   HDL 41 >39 mg/dL   VLDL Cholesterol Cal 25 5 - 40 mg/dL   LDL Calculated 156 (H) 0 - 99 mg/dL   Chol/HDL Ratio 5.4 (H) 0.0 - 4.4 ratio  Thyroid Panel With TSH  Result Value Ref Range   TSH 3.000 0.450 - 4.500 uIU/mL   T4, Total 8.4 4.5 - 12.0 ug/dL   T3 Uptake Ratio 46 (H) 24 - 39 %   Free Thyroxine Index 3.9 1.2 - 4.9  CBC with Differential  Result Value Ref Range   WBC 9.8 3.4 - 10.8 x10E3/uL   RBC 4.34 3.77 - 5.28 x10E6/uL   Hemoglobin 13.8 11.1 - 15.9 g/dL   Hematocrit 41.6 34.0 - 46.6 %   MCV 96 79 - 97 fL   MCH 31.8 26.6 - 33.0 pg   MCHC 33.2 31.5 - 35.7 g/dL   RDW 12.8 12.3 - 15.4 %   Platelets 375 150 - 379  x10E3/uL  Neutrophils 51 Not Estab. %   Lymphs 40 Not Estab. %   Monocytes 7 Not Estab. %   Eos 2 Not Estab. %   Basos 0 Not Estab. %   Neutrophils Absolute 5.0 1.4 - 7.0 x10E3/uL   Lymphocytes Absolute 3.9 (H) 0.7 - 3.1 x10E3/uL   Monocytes Absolute 0.7 0.1 - 0.9 x10E3/uL   EOS (ABSOLUTE) 0.2 0.0 - 0.4 x10E3/uL   Basophils Absolute 0.0 0.0 - 0.2 x10E3/uL   Immature Granulocytes 0 Not Estab. %   Immature Grans (Abs) 0.0 0.0 - 0.1 x10E3/uL      Assessment & Plan:   1. Generalized anxiety disorder - escitalopram (LEXAPRO) 10 MG tablet; Take 1 tablet (10 mg total) by mouth daily.  Dispense: 90 tablet; Refill: 3  2. Essential hypertension, benign - bisoprolol-hydrochlorothiazide (ZIAC) 10-6.25 MG tablet; Take 1 tablet by mouth daily.  Dispense: 90 tablet; Refill: 3 - CMP14+EGFR - Lipid panel - CBC with Differential  3. Olecranon bursitis of left elbow - ibuprofen (ADVIL,MOTRIN) 800 MG tablet; Take 1 tablet (800 mg total) by mouth every 8 (eight) hours as needed.  Dispense: 90 tablet; Refill: 3  4. Hyperlipidemia LDL goal <130 - lovastatin (MEVACOR) 20 MG tablet; Take 1 tablet (20 mg total) by mouth at bedtime.  Dispense: 90 tablet; Refill: 3  5. Well controlled persistent asthma - CMP14+EGFR - Lipid panel - Thyroid Panel With TSH - CBC with Differential   Continue all other maintenance medications as listed above.  Follow up plan: Recheck 6 months  Educational handout given for [place] PA-C [facility] [address]  [place], [place] [address] [phone]   11/26/2017, 9:54 PM

## 2017-11-28 ENCOUNTER — Encounter: Payer: Self-pay | Admitting: Physician Assistant

## 2017-11-28 ENCOUNTER — Ambulatory Visit (INDEPENDENT_AMBULATORY_CARE_PROVIDER_SITE_OTHER): Payer: Medicare Other | Admitting: Physician Assistant

## 2017-11-28 ENCOUNTER — Ambulatory Visit: Payer: Medicare Other | Admitting: Physician Assistant

## 2017-11-28 VITALS — BP 105/67 | HR 50 | Temp 97.6°F | Ht 63.5 in | Wt 131.0 lb

## 2017-11-28 DIAGNOSIS — Z91038 Other insect allergy status: Secondary | ICD-10-CM | POA: Diagnosis not present

## 2017-11-28 DIAGNOSIS — E785 Hyperlipidemia, unspecified: Secondary | ICD-10-CM

## 2017-11-28 MED ORDER — PREDNISONE 5 MG PO TABS: 5.0000 mg | ORAL_TABLET | Freq: Every day | ORAL | 2 refills | Status: DC

## 2017-11-28 NOTE — Progress Notes (Signed)
BP 105/67   Pulse (!) 50   Temp 97.6 F (36.4 C) (Oral)   Ht 5' 3.5" (1.613 m)   Wt 131 lb (59.4 kg)   BMI 22.84 kg/m    Subjective:    Patient ID: Tammie Martin, female    DOB: 1951/08/02, 67 y.o.   MRN: [id number]  HPI: Tammie Martin is a 67 y.o. female presenting on 11/28/2017 for Insect Bite  Patient comes in for a reaction to bug bite on her left arm.  She is always had significant reactions to bites mosquito bites.  She tries very hard to avoid getting bitten.  She will have redness and swelling around them it will often blister and take many many weeks to heal.  Patient does need to have a recheck on her cholesterol in a couple of months.  She has been off the medicine a few days when she had it checked last time.  The order will be placed.   Past Medical History:  Diagnosis Date  . Depression   . DJD (degenerative joint disease)   . Hyperlipidemia   . Hypertension   . Thyroid disease    Relevant past medical, surgical, family and social history reviewed and updated as indicated. Interim medical history since our last visit reviewed. Allergies and medications reviewed and updated. DATA REVIEWED: CHART IN EPIC  Family History reviewed for pertinent findings.  Review of Systems  Constitutional: Negative.   HENT: Negative.   Eyes: Negative.   Respiratory: Negative.   Gastrointestinal: Negative.   Genitourinary: Negative.   Skin: Positive for color change and wound.    Allergies as of 11/28/2017      Reactions   Penicillins Hives   Singulair [montelukast Sodium]    headache   Codeine Rash      Medication List        Accurate as of 11/28/17  1:43 PM. Always use your most recent med list.          bisoprolol-hydrochlorothiazide 10-6.25 MG tablet Commonly known as:  ZIAC Take 1 tablet by mouth daily.   calcium gluconate 500 MG tablet Take 1 tablet by mouth daily.   cetirizine 10 MG tablet Commonly known as:  ZYRTEC Take 10 mg by mouth  daily.   cholecalciferol 1000 units tablet Commonly known as:  VITAMIN D Take 2,000 Units by mouth 2 (two) times daily.   escitalopram 10 MG tablet Commonly known as:  LEXAPRO Take 1 tablet (10 mg total) by mouth daily.   fexofenadine 180 MG tablet Commonly known as:  ALLEGRA ALLERGY Take 1 tablet (180 mg total) by mouth daily.   Fish Oil 1000 MG Caps Take 2 capsules by mouth daily.   Flax Seed Oil 1000 MG Caps Take 2,000 mg by mouth daily.   Glucosamine 500 MG Caps Take 1,000 mg by mouth 2 (two) times daily.   ibuprofen 800 MG tablet Commonly known as:  ADVIL,MOTRIN Take 1 tablet (800 mg total) by mouth every 8 (eight) hours as needed.   levothyroxine 88 MCG tablet Commonly known as:  SYNTHROID, LEVOTHROID TAKE 1 TABLET EVERY DAY BEFORE BREAKFAST   lovastatin 20 MG tablet Commonly known as:  MEVACOR Take 1 tablet (20 mg total) by mouth at bedtime.   MULTIVITAMIN ADULT PO Take 1 capsule by mouth daily.   predniSONE 5 MG tablet Commonly known as:  DELTASONE Take 1-2 tablets (5-10 mg total) by mouth daily with breakfast.   PROBIOTIC ADVANCED PO Take by  mouth daily.   Turmeric 500 MG Caps Take 500 mg by mouth daily.          Objective:    BP 105/67   Pulse (!) 50   Temp 97.6 F (36.4 C) (Oral)   Ht 5' 3.5" (1.613 m)   Wt 131 lb (59.4 kg)   BMI 22.84 kg/m   Allergies  Allergen Reactions  . Penicillins Hives  . Singulair [Montelukast Sodium]     headache  . Codeine Rash    Wt Readings from Last 3 Encounters:  11/28/17 131 lb (59.4 kg)  11/23/17 131 lb (59.4 kg)  04/05/17 136 lb 9.6 oz (62 kg)    Physical Exam  Constitutional: She is oriented to person, place, and time. She appears well-developed and well-nourished.  HENT:  Head: Normocephalic and atraumatic.  Eyes: Pupils are equal, round, and reactive to light. Conjunctivae and EOM are normal.  Cardiovascular: Normal rate, regular rhythm, normal heart sounds and intact distal pulses.   Pulmonary/Chest: Effort normal and breath sounds normal.  Abdominal: Soft. Bowel sounds are normal.  Neurological: She is alert and oriented to person, place, and time. She has normal reflexes.  Skin: Skin is warm and dry. Rash noted. Rash is urticarial. There is erythema.     Erythema and swelling on the left forearm with surrounding blisters.  There is no drainage of pus.  Psychiatric: She has a normal mood and affect. Her behavior is normal. Judgment and thought content normal.        Assessment & Plan:   1. Hyperlipidemia LDL goal <130 - Lipid panel; Future  2. Allergic reaction to insect bite - predniSONE (DELTASONE) 5 MG tablet; Take 1-2 tablets (5-10 mg total) by mouth daily with breakfast.  Dispense: 60 tablet; Refill: 2   Continue all other maintenance medications as listed above.  Follow up plan: No follow-ups on file.  Educational handout given for [place] PA-C [facility] [address]  [place], [place] [address] [phone]   11/28/2017, 1:43 PM

## 2017-12-06 ENCOUNTER — Encounter: Payer: Self-pay | Admitting: Physician Assistant

## 2017-12-20 DIAGNOSIS — M546 Pain in thoracic spine: Secondary | ICD-10-CM | POA: Diagnosis not present

## 2017-12-20 DIAGNOSIS — M9901 Segmental and somatic dysfunction of cervical region: Secondary | ICD-10-CM | POA: Diagnosis not present

## 2017-12-20 DIAGNOSIS — M47812 Spondylosis without myelopathy or radiculopathy, cervical region: Secondary | ICD-10-CM | POA: Diagnosis not present

## 2018-01-17 DIAGNOSIS — M47812 Spondylosis without myelopathy or radiculopathy, cervical region: Secondary | ICD-10-CM | POA: Diagnosis not present

## 2018-01-17 DIAGNOSIS — M9901 Segmental and somatic dysfunction of cervical region: Secondary | ICD-10-CM | POA: Diagnosis not present

## 2018-01-17 DIAGNOSIS — M546 Pain in thoracic spine: Secondary | ICD-10-CM | POA: Diagnosis not present

## 2018-02-14 DIAGNOSIS — M9901 Segmental and somatic dysfunction of cervical region: Secondary | ICD-10-CM | POA: Diagnosis not present

## 2018-02-14 DIAGNOSIS — M47812 Spondylosis without myelopathy or radiculopathy, cervical region: Secondary | ICD-10-CM | POA: Diagnosis not present

## 2018-02-14 DIAGNOSIS — M546 Pain in thoracic spine: Secondary | ICD-10-CM | POA: Diagnosis not present

## 2018-03-20 DIAGNOSIS — M47812 Spondylosis without myelopathy or radiculopathy, cervical region: Secondary | ICD-10-CM | POA: Diagnosis not present

## 2018-03-20 DIAGNOSIS — M9901 Segmental and somatic dysfunction of cervical region: Secondary | ICD-10-CM | POA: Diagnosis not present

## 2018-03-20 DIAGNOSIS — M546 Pain in thoracic spine: Secondary | ICD-10-CM | POA: Diagnosis not present

## 2018-03-21 ENCOUNTER — Encounter: Payer: Self-pay | Admitting: *Deleted

## 2018-03-21 ENCOUNTER — Ambulatory Visit (INDEPENDENT_AMBULATORY_CARE_PROVIDER_SITE_OTHER): Payer: Medicare Other | Admitting: *Deleted

## 2018-03-21 VITALS — BP 125/72 | HR 56 | Ht 62.25 in | Wt 135.0 lb

## 2018-03-21 DIAGNOSIS — Z1211 Encounter for screening for malignant neoplasm of colon: Secondary | ICD-10-CM

## 2018-03-21 DIAGNOSIS — Z0000 Encounter for general adult medical examination without abnormal findings: Secondary | ICD-10-CM | POA: Diagnosis not present

## 2018-03-21 NOTE — Progress Notes (Addendum)
Subjective:   Tammie Martin is a 67 y.o. female who presents for a Medicare Annual Wellness Visit. [name] lives at home alone. She has two daughters and 4 grandchildren. One daughter lives locally and the other does not. She talks with them often. Her husband passed away 41 years at age 40 from a rare blood clotting disorder. 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 She enjoys working with her. They do puzzles, watch movies, and she takes her to dinner once a month.    Review of Systems    Patient reports that her overall health is unchanged compared to last year.  Cardiac Risk Factors include: advanced age (>35men, >23 women);dyslipidemia;sedentary lifestyle  Musculoskeletal: Chronic neck and back pain. Sees a chiropractor once a month and that really helps keep her symptoms controlled. He was able to help with left shoulder pain and decreased range of motion as well.   All other systems negative      Current Medications (verified) Outpatient Encounter Medications as of 03/21/2018  Medication Sig  . bisoprolol-hydrochlorothiazide (ZIAC) 10-6.25 MG tablet Take 1 tablet by mouth daily.  . calcium gluconate 500 MG tablet Take 1 tablet by mouth daily.   . cholecalciferol (VITAMIN D) 1000 units tablet Take 2,000 Units by mouth 2 (two) times daily.  Marland Kitchen escitalopram (LEXAPRO) 10 MG tablet Take 1 tablet (10 mg total) by mouth daily.  . fexofenadine (ALLEGRA ALLERGY) 180 MG tablet Take 1 tablet (180 mg total) by mouth daily.  . Flaxseed, Linseed, (FLAX SEED OIL) 1000 MG CAPS Take 2,000 mg by mouth daily.  . Glucosamine 500 MG CAPS Take 1,000 mg by mouth 2 (two) times daily.   Marland Kitchen ibuprofen (ADVIL,MOTRIN) 800 MG tablet Take 1 tablet (800 mg total) by mouth every 8 (eight) hours as needed.  Marland Kitchen levothyroxine (SYNTHROID, LEVOTHROID) 88 MCG tablet TAKE 1 TABLET EVERY DAY BEFORE BREAKFAST  . lovastatin (MEVACOR) 20 MG tablet Take 1  tablet (20 mg total) by mouth at bedtime.  . Multiple Vitamins-Minerals (MULTIVITAMIN ADULT PO) Take 1 capsule by mouth daily.  . Omega-3 Fatty Acids (FISH OIL) 1000 MG CAPS Take 2 capsules by mouth daily.  . predniSONE (DELTASONE) 5 MG tablet Take 1-2 tablets (5-10 mg total) by mouth daily with breakfast.  . Probiotic Product (PROBIOTIC ADVANCED PO) Take by mouth daily.  . Turmeric 500 MG CAPS Take 500 mg by mouth daily.  . cetirizine (ZYRTEC) 10 MG tablet Take 10 mg by mouth daily.   No facility-administered encounter medications on file as of 03/21/2018.     Allergies (verified) Penicillins; Singulair [montelukast sodium]; and Codeine   History: Past Medical History:  Diagnosis Date  . Depression   . DJD (degenerative joint disease)   . Hyperlipidemia   . Hypertension   . Thyroid disease    Past Surgical History:  Procedure Laterality Date  . ABDOMINAL HYSTERECTOMY    . APPENDECTOMY    . BREAST ENHANCEMENT SURGERY    . TONSILLECTOMY     Family History  Problem Relation Age of Onset  . Cancer Mother        breast cancer at 102   . Kidney disease Mother   . Cancer Maternal Aunt        colon cancer  . Heart attack Father   . Diabetes Sister   . Obesity Sister   . Depression Sister   . Alcohol abuse Brother   .  Cirrhosis Brother   . Depression Brother   . Liver disease Brother   . Diabetes Daughter   . Fibromyalgia Daughter   . Depression Daughter   . Anxiety disorder Daughter   . Arthritis Daughter   . Depression Sister   . Obesity Sister    Social History   Socioeconomic History  . Marital status: Widowed    Spouse name: Not on file  . Number of children: 2  . Years of education: 57  . Highest education level: Associate degree: academic program  Occupational History  . Occupation: Retired    Comment: Equities trader  Social Needs  . Financial resource strain: Not hard at all  . Food insecurity:    Worry: Never true    Inability: Never true  .  Transportation needs:    Medical: No    Non-medical: No  Tobacco Use  . Smoking status: Current Every Day Smoker    Packs/day: 1.00    Years: 40.00    Pack years: 40.00    Types: Cigarettes  . Smokeless tobacco: Never Used  Substance and Sexual Activity  . Alcohol use: No  . Drug use: No  . Sexual activity: Not Currently  Lifestyle  . Physical activity:    Days per week: 0 days    Minutes per session: 0 min  . Stress: Not at all  Relationships  . Social connections:    Talks on phone: More than three times a week    Gets together: More than three times a week    Attends religious service: Never    Active member of club or organization: Yes    Attends meetings of clubs or organizations: Never    Relationship status: Widowed  Other Topics Concern  . Not on file  Social History Narrative  . Not on file    Tobacco Use Yes.  Smoking cessation offered? Yes. Patient aware of the risks of smoking and the benefits of quitting. Was prescribed chantix recently but it was going to cost $700. Has tried hypnosis twice and it wasn't successful. At this point she doesn't have a strong opinion either way.   Clinical Intake:    How often do you need to have someone help you when you read instructions, pamphlets, or other written materials from your doctor or pharmacy?: 1 - Never What is the last grade level you completed in school?: associates degree nursing  Interpreter Needed?: No  Information entered by :: Chong Sicilian, RN   Activities of Daily Living In your present state of health, do you have any difficulty performing the following activities: 03/21/2018  Hearing? N  Comment last had them checked about 20 years ago  Vision? N  Comment has appt scheduled  Difficulty concentrating or making decisions? N  Walking or climbing stairs? N  Dressing or bathing? N  Doing errands, shopping? N  Preparing Food and eating ? N  Using the Toilet? N  In the past six months, have you  accidently leaked urine? N  Do you have problems with loss of bowel control? N  Managing your Medications? N  Managing your Finances? N  Housekeeping or managing your Housekeeping? N  Some recent data might be hidden     Diet Eats two to three meals a day depending on her schedule. Mainly eats when she's hungry and not just because it is a meal time. Drinks mostly water and coffee. No soda.   Exercise Current Exercise Habits: The patient does not  participate in regular exercise at present, Exercise limited by: None identified   Depression Screen PHQ 2/9 Scores 11/28/2017 11/23/2017 04/05/2017 01/12/2017 12/23/2016 05/05/2016 12/17/2015  PHQ - 2 Score 0 0 0 0 0 0 0  PHQ- 9 Score - - - - - - -     Fall Risk Fall Risk  03/21/2018 11/28/2017 11/23/2017 04/05/2017 01/12/2017  Falls in the past year? No Yes Yes No Yes  Number falls in past yr: - 2 or more 2 or more - -  Injury with Fall? - Yes Yes - -  Risk Factor Category  - - High Fall Risk - -    Safety Is the patient's home free of loose throw rugs in walkways, pet beds, electrical cords, etc?   yes      Grab bars in the bathroom? no      Walkin shower? no      Shower Seat? no      Handrails on the stairs?   yes      Adequate lighting?   yes  Patient Care Team: Theodoro Clock as PCP - General (Physician Assistant)  Hospitalizations, surgeries, and ER visits in previous 12 months No hospitalizations, ER visits, or surgeries this past year.   Objective:    Today's Vitals   03/21/18 1514  BP: 125/72  Pulse: (!) 56  Weight: 135 lb (61.2 kg)  Height: 5' 2.25" (1.581 m)   Body mass index is 24.49 kg/m.  No flowsheet data found.  Hearing/Vision  normal or No deficits noted during visit.  Cognitive Function: MMSE - Mini Mental State Exam 03/21/2018  Orientation to time 5  Orientation to Place 5  Registration 3  Attention/ Calculation 5  Recall 3  Language- name 2 objects 2  Language- repeat 1  Language- follow 3 step  command 3  Language- read & follow direction 1  Write a sentence 1  Copy design 1  Total score 30       Normal Cognitive Function Screening: Yes    Immunizations and Health Maintenance Immunization History  Administered Date(s) Administered  . Influenza, High Dose Seasonal PF 07/05/2017  . Influenza,inj,Quad PF,6+ Mos 07/19/2016  . Pneumococcal Conjugate-13 05/05/2016  . Pneumococcal Polysaccharide-23 05/23/2013   Health Maintenance Due  Topic Date Due  . Hepatitis C Screening  22-Oct-1950  . TETANUS/TDAP  02/06/1968  . Fecal DNA (Cologuard)  02/06/1999   Health Maintenance  Topic Date Due  . Hepatitis C Screening  [birth year]  . TETANUS/TDAP  02/06/1968  . Fecal DNA (Cologuard)  02/06/1999  . DEXA SCAN  03/22/2019 (Originally 02/05/2014)  . MAMMOGRAM  03/21/2020 (Originally 05/14/2014)  . INFLUENZA VACCINE  03/29/2018  . PNA vac Low Risk Adult (2 of 2 - PPSV23) 05/23/2018        Assessment:   This is a routine wellness examination for [place].    Plan:    Goals    . Exercise 150 min/wk Moderate Activity        Health Maintenance Recommendations: Discussed and dexa scan. Patient declined. Tdap-around 10 years ago at health dept. Will request record. Shingrix-Will check on cost next week. Phone number provided. Wants to wait until she's off work for a few days.   Additional Screening Recommendations: Lung: Low Dose CT Chest recommended if Age 52-80 years, 30 pack-year currently smoking OR have quit w/in 15years. Patient does qualify. Hepatitis C Screening recommended: yes per CDC guidelines. Can have with next labs if she wants  Today's Orders  Orders Placed This Encounter  Procedures  . Cologuard   General Recommendations: Keep f/u with Terald Sleeper, PA-C and any other specialty appointments you may have Continue current medications Move carefully to avoid falls. Use assistive devices like a can or walker if needed. Start walking. Aim for at least 150  minutes of moderate activity a week. Read or work on puzzles daily Stay connected with friends and family  I have personally reviewed and noted the following in the patient's chart:   . Medical and social history . Use of alcohol, tobacco or illicit drugs  . Current medications and supplements . Functional ability and status . Nutritional status . Physical activity . Advanced directives . List of other physicians . Hospitalizations, surgeries, and ER visits in previous 12 months . Vitals . Screenings to include cognitive, depression, and falls . Referrals and appointments  In addition, I have reviewed and discussed with patient certain preventive protocols, quality metrics, and best practice recommendations. A written personalized care plan for preventive services as well as general preventive health recommendations were provided to patient.     Chong Sicilian, RN   03/21/2018   I have reviewed and agree with the above AWV documentation.   Terald Sleeper PA-C [facility] [address]  [place], [place] [address] [phone]

## 2018-03-21 NOTE — Patient Instructions (Signed)
  Tammie Martin , Thank you for taking time to come for your Medicare Wellness Visit. I appreciate your ongoing commitment to your health goals. Please review the following plan we discussed and let me know if I can assist you in the future.   These are the goals we discussed: Goals    . Exercise 150 min/wk Moderate Activity       This is a list of the screening recommended for you and due dates:  Health Maintenance  Topic Date Due  .  Hepatitis C: One time screening is recommended by Center for Disease Control  (CDC) for  adults born from [birth year] through 1967.   November 23, 1950  . Tetanus Vaccine  02/06/1968  . Colon Cancer Screening  06/02/2011  . DEXA scan (bone density measurement)  02/05/2014  . Mammogram  05/14/2014  . Flu Shot  03/29/2018  . Pneumonia vaccines (2 of 2 - PPSV23) 05/23/2018

## 2018-04-18 DIAGNOSIS — M546 Pain in thoracic spine: Secondary | ICD-10-CM | POA: Diagnosis not present

## 2018-04-18 DIAGNOSIS — M47812 Spondylosis without myelopathy or radiculopathy, cervical region: Secondary | ICD-10-CM | POA: Diagnosis not present

## 2018-04-18 DIAGNOSIS — M9901 Segmental and somatic dysfunction of cervical region: Secondary | ICD-10-CM | POA: Diagnosis not present

## 2018-05-15 DIAGNOSIS — Z1212 Encounter for screening for malignant neoplasm of rectum: Secondary | ICD-10-CM | POA: Diagnosis not present

## 2018-05-15 DIAGNOSIS — Z1211 Encounter for screening for malignant neoplasm of colon: Secondary | ICD-10-CM | POA: Diagnosis not present

## 2018-05-18 LAB — COLOGUARD: Cologuard: NEGATIVE

## 2018-05-29 ENCOUNTER — Other Ambulatory Visit: Payer: Self-pay | Admitting: Family Medicine

## 2018-05-31 ENCOUNTER — Telehealth: Payer: Self-pay | Admitting: *Deleted

## 2018-05-31 MED ORDER — LEVOTHYROXINE SODIUM 88 MCG PO TABS: ORAL_TABLET | ORAL | 0 refills | Status: DC

## 2018-05-31 NOTE — Telephone Encounter (Signed)
Pt aware refill sent [facility] for 30 day supply

## 2018-07-09 ENCOUNTER — Ambulatory Visit (INDEPENDENT_AMBULATORY_CARE_PROVIDER_SITE_OTHER): Payer: Medicare Other

## 2018-07-09 DIAGNOSIS — E785 Hyperlipidemia, unspecified: Secondary | ICD-10-CM

## 2018-07-09 DIAGNOSIS — Z23 Encounter for immunization: Secondary | ICD-10-CM

## 2018-07-09 NOTE — Addendum Note (Signed)
Addended by: Thana Ates on: 07/09/2018 10:56 AM   Modules accepted: Orders

## 2018-07-10 LAB — LIPID PANEL
CHOLESTEROL TOTAL: 183 mg/dL (ref 100–199)
Chol/HDL Ratio: 4.1 ratio (ref 0.0–4.4)
HDL: 45 mg/dL (ref >39)
LDL Calculated: 117 mg/dL — ABNORMAL HIGH (ref 0–99)
TRIGLYCERIDES: 103 mg/dL (ref 0–149)
VLDL CHOLESTEROL CAL: 21 mg/dL (ref 5–40)

## 2018-08-08 ENCOUNTER — Other Ambulatory Visit: Payer: Self-pay | Admitting: Physician Assistant

## 2018-08-08 DIAGNOSIS — Z91038 Other insect allergy status: Secondary | ICD-10-CM

## 2018-08-08 NOTE — Telephone Encounter (Signed)
Last seen 11/28/17

## 2018-11-14 ENCOUNTER — Encounter: Payer: Self-pay | Admitting: Physician Assistant

## 2018-11-15 ENCOUNTER — Encounter: Payer: Self-pay | Admitting: Physician Assistant

## 2018-11-15 ENCOUNTER — Other Ambulatory Visit: Payer: Self-pay | Admitting: Physician Assistant

## 2018-11-15 DIAGNOSIS — M7022 Olecranon bursitis, left elbow: Secondary | ICD-10-CM

## 2018-11-15 DIAGNOSIS — E785 Hyperlipidemia, unspecified: Secondary | ICD-10-CM

## 2018-11-15 DIAGNOSIS — F411 Generalized anxiety disorder: Secondary | ICD-10-CM

## 2018-11-15 DIAGNOSIS — I10 Essential (primary) hypertension: Secondary | ICD-10-CM

## 2018-11-15 MED ORDER — LEVOTHYROXINE SODIUM 88 MCG PO TABS: ORAL_TABLET | ORAL | 0 refills | Status: DC

## 2018-11-15 MED ORDER — ESCITALOPRAM OXALATE 10 MG PO TABS: 10.0000 mg | ORAL_TABLET | Freq: Every day | ORAL | 0 refills | Status: DC

## 2018-11-15 MED ORDER — LOVASTATIN 20 MG PO TABS: 20.0000 mg | ORAL_TABLET | Freq: Every day | ORAL | 0 refills | Status: DC

## 2018-11-15 MED ORDER — IBUPROFEN 800 MG PO TABS: 800.0000 mg | ORAL_TABLET | Freq: Three times a day (TID) | ORAL | 0 refills | Status: DC | PRN

## 2018-11-15 MED ORDER — BISOPROLOL-HYDROCHLOROTHIAZIDE 10-6.25 MG PO TABS: 1.0000 | ORAL_TABLET | Freq: Every day | ORAL | 0 refills | Status: DC

## 2018-11-18 ENCOUNTER — Other Ambulatory Visit: Payer: Self-pay | Admitting: Physician Assistant

## 2018-11-18 ENCOUNTER — Other Ambulatory Visit: Payer: Self-pay | Admitting: Family Medicine

## 2018-11-18 DIAGNOSIS — I10 Essential (primary) hypertension: Secondary | ICD-10-CM

## 2018-11-18 DIAGNOSIS — M7022 Olecranon bursitis, left elbow: Secondary | ICD-10-CM

## 2018-11-18 DIAGNOSIS — E785 Hyperlipidemia, unspecified: Secondary | ICD-10-CM

## 2018-11-18 DIAGNOSIS — F411 Generalized anxiety disorder: Secondary | ICD-10-CM

## 2018-11-19 NOTE — Telephone Encounter (Signed)
Last thyroid 11/23/17

## 2018-11-19 NOTE — Telephone Encounter (Signed)
Last seen 11/28/17

## 2018-11-29 ENCOUNTER — Encounter: Payer: Self-pay | Admitting: Physician Assistant

## 2018-11-29 ENCOUNTER — Other Ambulatory Visit: Payer: Self-pay | Admitting: Physician Assistant

## 2018-11-29 DIAGNOSIS — F411 Generalized anxiety disorder: Secondary | ICD-10-CM

## 2018-11-29 MED ORDER — ESCITALOPRAM OXALATE 20 MG PO TABS: 20.0000 mg | ORAL_TABLET | Freq: Every day | ORAL | 3 refills | Status: DC

## 2019-02-20 ENCOUNTER — Telehealth: Payer: Self-pay | Admitting: Physician Assistant

## 2019-02-20 ENCOUNTER — Other Ambulatory Visit: Payer: Self-pay | Admitting: Physician Assistant

## 2019-02-20 DIAGNOSIS — Z0000 Encounter for general adult medical examination without abnormal findings: Secondary | ICD-10-CM

## 2019-02-20 DIAGNOSIS — I10 Essential (primary) hypertension: Secondary | ICD-10-CM

## 2019-02-20 NOTE — Telephone Encounter (Signed)
Left detailed message for pt 

## 2019-02-20 NOTE — Telephone Encounter (Signed)
ordered

## 2019-02-20 NOTE — Telephone Encounter (Signed)
Please review and advise.

## 2019-02-21 DIAGNOSIS — H25813 Combined forms of age-related cataract, bilateral: Secondary | ICD-10-CM | POA: Diagnosis not present

## 2019-02-21 DIAGNOSIS — H43813 Vitreous degeneration, bilateral: Secondary | ICD-10-CM | POA: Diagnosis not present

## 2019-02-21 DIAGNOSIS — H524 Presbyopia: Secondary | ICD-10-CM | POA: Diagnosis not present

## 2019-02-21 DIAGNOSIS — D3131 Benign neoplasm of right choroid: Secondary | ICD-10-CM | POA: Diagnosis not present

## 2019-03-05 ENCOUNTER — Other Ambulatory Visit: Payer: Self-pay

## 2019-03-05 ENCOUNTER — Other Ambulatory Visit: Payer: Medicare Other

## 2019-03-05 DIAGNOSIS — I10 Essential (primary) hypertension: Secondary | ICD-10-CM

## 2019-03-05 DIAGNOSIS — Z0000 Encounter for general adult medical examination without abnormal findings: Secondary | ICD-10-CM | POA: Diagnosis not present

## 2019-03-06 LAB — CMP14+EGFR
ALT: 7 IU/L (ref 0–32)
AST: 13 IU/L (ref 0–40)
Albumin/Globulin Ratio: 1.5 (ref 1.2–2.2)
Albumin: 4.2 g/dL (ref 3.8–4.8)
Alkaline Phosphatase: 74 IU/L (ref 39–117)
BUN/Creatinine Ratio: 14 (ref 12–28)
BUN: 9 mg/dL (ref 8–27)
Bilirubin Total: 0.4 mg/dL (ref 0.0–1.2)
CO2: 24 mmol/L (ref 20–29)
Calcium: 9.3 mg/dL (ref 8.7–10.3)
Chloride: 96 mmol/L (ref 96–106)
Creatinine, Ser: 0.63 mg/dL (ref 0.57–1.00)
GFR calc Af Amer: 105 mL/min/{1.73_m2} (ref >59)
GFR calc non Af Amer: 91 mL/min/{1.73_m2} (ref >59)
Globulin, Total: 2.8 g/dL (ref 1.5–4.5)
Glucose: 97 mg/dL (ref 65–99)
Potassium: 4.5 mmol/L (ref 3.5–5.2)
Sodium: 135 mmol/L (ref 134–144)
Total Protein: 7 g/dL (ref 6.0–8.5)

## 2019-03-06 LAB — CBC WITH DIFFERENTIAL/PLATELET
Basophils Absolute: 0.1 10*3/uL (ref 0.0–0.2)
Basos: 1 %
EOS (ABSOLUTE): 0.3 10*3/uL (ref 0.0–0.4)
Eos: 3 %
Hematocrit: 38.9 % (ref 34.0–46.6)
Hemoglobin: 13 g/dL (ref 11.1–15.9)
Immature Grans (Abs): 0 10*3/uL (ref 0.0–0.1)
Immature Granulocytes: 0 %
Lymphocytes Absolute: 3.1 10*3/uL (ref 0.7–3.1)
Lymphs: 32 %
MCH: 31.7 pg (ref 26.6–33.0)
MCHC: 33.4 g/dL (ref 31.5–35.7)
MCV: 95 fL (ref 79–97)
Monocytes Absolute: 0.7 10*3/uL (ref 0.1–0.9)
Monocytes: 7 %
Neutrophils Absolute: 5.6 10*3/uL (ref 1.4–7.0)
Neutrophils: 57 %
Platelets: 368 10*3/uL (ref 150–450)
RBC: 4.1 x10E6/uL (ref 3.77–5.28)
RDW: 11.7 % (ref 11.7–15.4)
WBC: 9.7 10*3/uL (ref 3.4–10.8)

## 2019-03-06 LAB — TSH: TSH: 5.73 u[IU]/mL — ABNORMAL HIGH (ref 0.450–4.500)

## 2019-03-06 LAB — LIPID PANEL
Chol/HDL Ratio: 4.9 ratio — ABNORMAL HIGH (ref 0.0–4.4)
Cholesterol, Total: 188 mg/dL (ref 100–199)
HDL: 38 mg/dL — ABNORMAL LOW (ref >39)
LDL Calculated: 117 mg/dL — ABNORMAL HIGH (ref 0–99)
Triglycerides: 165 mg/dL — ABNORMAL HIGH (ref 0–149)
VLDL Cholesterol Cal: 33 mg/dL (ref 5–40)

## 2019-03-08 ENCOUNTER — Other Ambulatory Visit: Payer: Self-pay | Admitting: Physician Assistant

## 2019-03-08 ENCOUNTER — Other Ambulatory Visit: Payer: Self-pay | Admitting: *Deleted

## 2019-03-08 DIAGNOSIS — E039 Hypothyroidism, unspecified: Secondary | ICD-10-CM

## 2019-03-08 MED ORDER — LEVOTHYROXINE SODIUM 100 MCG PO TABS: 100.0000 ug | ORAL_TABLET | Freq: Every day | ORAL | 1 refills | Status: DC

## 2019-03-11 ENCOUNTER — Other Ambulatory Visit: Payer: Self-pay

## 2019-03-12 ENCOUNTER — Encounter: Payer: Self-pay | Admitting: Physician Assistant

## 2019-03-12 ENCOUNTER — Ambulatory Visit (INDEPENDENT_AMBULATORY_CARE_PROVIDER_SITE_OTHER): Payer: Medicare Other | Admitting: Physician Assistant

## 2019-03-12 DIAGNOSIS — E785 Hyperlipidemia, unspecified: Secondary | ICD-10-CM | POA: Diagnosis not present

## 2019-03-12 DIAGNOSIS — I10 Essential (primary) hypertension: Secondary | ICD-10-CM | POA: Diagnosis not present

## 2019-03-12 MED ORDER — BISOPROLOL-HYDROCHLOROTHIAZIDE 10-6.25 MG PO TABS: 1.0000 | ORAL_TABLET | Freq: Every day | ORAL | 3 refills | Status: DC

## 2019-03-12 MED ORDER — BETAMETHASONE DIPROPIONATE 0.05 % EX CREA: TOPICAL_CREAM | Freq: Two times a day (BID) | CUTANEOUS | 0 refills | Status: DC

## 2019-03-12 MED ORDER — LOVASTATIN 20 MG PO TABS: 20.0000 mg | ORAL_TABLET | Freq: Every day | ORAL | 3 refills | Status: DC

## 2019-03-12 NOTE — Progress Notes (Signed)
BP 112/66   Pulse 63   Temp (!) 97.5 F (36.4 C) (Oral)   Ht 5' 2.25" (1.581 m)   Wt 137 lb 3.2 oz (62.2 kg)   BMI 24.89 kg/m    Subjective:    Patient ID: Tammie Martin, female    DOB: 09/24/1950, 68 y.o.   MRN: [id number]  HPI: Tammie Martin is a 68 y.o. female presenting on 03/12/2019 for Hypothyroidism, Hyperlipidemia, and Medical Management of Chronic Issues    This patient comes in for.  Recheck on her chronic medical conditions.  They do include hypertension, hypothyroidism, allergic rhinitis, anxiety, and depression.  All the.  Patient's medications are reviewed.  They will be refilled and sent.  She had been feeling extremely tired over the past 6 months.  Her thyroid had gotten out of range.  Anytime she gets out of range she can tell a great difference.  She feels best when her TSH runs two-point or under.  That is going to be our goal to reach.  She had a medication adjustment this month and we will plan for her to come back in 2 months and have it rechecked.  She reports otherwise that she is doing fairly well not having any difficulties.  She has been quite distraught over the outcome of that situation.  And she is quite fearful of going out into public. Past Medical History:  Diagnosis Date  . Depression   . DJD (degenerative joint disease)   . Hyperlipidemia   . Hypertension   . Thyroid disease    Relevant past medical, surgical, family and social history reviewed and updated as indicated. Interim medical history since our last visit reviewed. Allergies and medications reviewed and updated. DATA REVIEWED: CHART IN EPIC  Family History reviewed for pertinent findings.  Review of Systems  Constitutional: Positive for fatigue.  HENT: Negative.   Eyes: Negative.   Respiratory: Negative.   Gastrointestinal: Negative.   Genitourinary: Negative.   Musculoskeletal: Positive for arthralgias and myalgias.  Neurological: Positive for weakness.     Allergies as of 03/12/2019      Reactions   Penicillins Hives   Singulair [montelukast Sodium]    headache   Codeine Rash      Medication List       Accurate as of March 12, 2019  4:52 PM. If you have any questions, ask your nurse or doctor.        STOP taking these medications   fexofenadine 180 MG tablet Commonly known as: Allegra Allergy Stopped by: Terald Sleeper, PA-C   predniSONE 5 MG tablet Commonly known as: DELTASONE Stopped by: Terald Sleeper, PA-C     TAKE these medications   betamethasone dipropionate 0.05 % cream Commonly known as: DIPROLENE Apply topically 2 (two) times daily. To affected areas Started by: Terald Sleeper, PA-C   bisoprolol-hydrochlorothiazide 10-6.25 MG tablet Commonly known as: ZIAC Take 1 tablet by mouth daily.   calcium gluconate 500 MG tablet Take 1 tablet by mouth daily.   cetirizine 10 MG tablet Commonly known as: ZYRTEC Take 10 mg by mouth daily.   cholecalciferol 1000 units tablet Commonly known as: VITAMIN D Take 2,000 Units by mouth 2 (two) times daily.   escitalopram 20 MG tablet Commonly known as: LEXAPRO Take 1 tablet (20 mg total) by mouth daily.   Fish Oil 1000 MG Caps Take 2 capsules by mouth daily.   Flax Seed Oil 1000 MG Caps Take  2,000 mg by mouth daily.   Glucosamine 500 MG Caps Take 1,000 mg by mouth 2 (two) times daily.   IBU 800 MG tablet Generic drug: ibuprofen TAKE 1 TABLET EVERY 8 HOURS AS NEEDED   levothyroxine 100 MCG tablet Commonly known as: SYNTHROID Take 1 tablet (100 mcg total) by mouth daily before breakfast.   lovastatin 20 MG tablet Commonly known as: MEVACOR Take 1 tablet (20 mg total) by mouth at bedtime.   MULTIVITAMIN ADULT PO Take 1 capsule by mouth daily.   PROBIOTIC ADVANCED PO Take by mouth daily.   Turmeric 500 MG Caps Take 500 mg by mouth daily.          Objective:    BP 112/66   Pulse 63   Temp (!) 97.5 F (36.4 C) (Oral)   Ht 5' 2.25" (1.581 m)   Wt 137  lb 3.2 oz (62.2 kg)   BMI 24.89 kg/m   Allergies  Allergen Reactions  . Penicillins Hives  . Singulair [Montelukast Sodium]     headache  . Codeine Rash    Wt Readings from Last 3 Encounters:  03/12/19 137 lb 3.2 oz (62.2 kg)  03/21/18 135 lb (61.2 kg)  11/28/17 131 lb (59.4 kg)    Physical Exam Constitutional:      Appearance: She is well-developed.  HENT:     Head: Normocephalic and atraumatic.  Eyes:     Conjunctiva/sclera: Conjunctivae normal.     Pupils: Pupils are equal, round, and reactive to light.  Cardiovascular:     Rate and Rhythm: Normal rate and regular rhythm.     Heart sounds: Normal heart sounds.  Pulmonary:     Effort: Pulmonary effort is normal.  Skin:    General: Skin is warm and dry.     Findings: No rash.  Neurological:     Mental Status: She is alert and oriented to person, place, and time.     Deep Tendon Reflexes: Reflexes are normal and symmetric.  Psychiatric:        Behavior: Behavior normal.        Thought Content: Thought content normal.        Judgment: Judgment normal.     Results for orders placed or performed in visit on 03/05/19  TSH  Result Value Ref Range   TSH 5.730 (H) 0.450 - 4.500 uIU/mL  Lipid panel  Result Value Ref Range   Cholesterol, Total 188 100 - 199 mg/dL   Triglycerides 165 (H) 0 - 149 mg/dL   HDL 38 (L) >39 mg/dL   VLDL Cholesterol Cal 33 5 - 40 mg/dL   LDL Calculated 117 (H) 0 - 99 mg/dL   Chol/HDL Ratio 4.9 (H) 0.0 - 4.4 ratio  CMP14+EGFR  Result Value Ref Range   Glucose 97 65 - 99 mg/dL   BUN 9 8 - 27 mg/dL   Creatinine, Ser 0.63 0.57 - 1.00 mg/dL   GFR calc non Af Amer 91 >59 mL/min/1.73   GFR calc Af Amer 105 >59 mL/min/1.73   BUN/Creatinine Ratio 14 12 - 28   Sodium 135 134 - 144 mmol/L   Potassium 4.5 3.5 - 5.2 mmol/L   Chloride 96 96 - 106 mmol/L   CO2 24 20 - 29 mmol/L   Calcium 9.3 8.7 - 10.3 mg/dL   Total Protein 7.0 6.0 - 8.5 g/dL   Albumin 4.2 3.8 - 4.8 g/dL   Globulin, Total 2.8 1.5  - 4.5 g/dL   Albumin/Globulin Ratio 1.5  1.2 - 2.2   Bilirubin Total 0.4 0.0 - 1.2 mg/dL   Alkaline Phosphatase 74 39 - 117 IU/L   AST 13 0 - 40 IU/L   ALT 7 0 - 32 IU/L  CBC with Differential/Platelet  Result Value Ref Range   WBC 9.7 3.4 - 10.8 x10E3/uL   RBC 4.10 3.77 - 5.28 x10E6/uL   Hemoglobin 13.0 11.1 - 15.9 g/dL   Hematocrit 38.9 34.0 - 46.6 %   MCV 95 79 - 97 fL   MCH 31.7 26.6 - 33.0 pg   MCHC 33.4 31.5 - 35.7 g/dL   RDW 11.7 11.7 - 15.4 %   Platelets 368 150 - 450 x10E3/uL   Neutrophils 57 Not Estab. %   Lymphs 32 Not Estab. %   Monocytes 7 Not Estab. %   Eos 3 Not Estab. %   Basos 1 Not Estab. %   Neutrophils Absolute 5.6 1.4 - 7.0 x10E3/uL   Lymphocytes Absolute 3.1 0.7 - 3.1 x10E3/uL   Monocytes Absolute 0.7 0.1 - 0.9 x10E3/uL   EOS (ABSOLUTE) 0.3 0.0 - 0.4 x10E3/uL   Basophils Absolute 0.1 0.0 - 0.2 x10E3/uL   Immature Granulocytes 0 Not Estab. %   Immature Grans (Abs) 0.0 0.0 - 0.1 x10E3/uL      Assessment & Plan:   1. Hyperlipidemia LDL goal <130 - lovastatin (MEVACOR) 20 MG tablet; Take 1 tablet (20 mg total) by mouth at bedtime.  Dispense: 90 tablet; Refill: 3  2. Essential hypertension, benign - bisoprolol-hydrochlorothiazide (ZIAC) 10-6.25 MG tablet; Take 1 tablet by mouth daily.  Dispense: 90 tablet; Refill: 3   Continue all other maintenance medications as listed above.  Follow up plan: Return in about 6 months (around 09/12/2019).  Educational handout given for [facility] PA-C [facility] [address]  [place], [place] [address] [phone]   03/12/2019, 4:53 PM

## 2019-04-24 DIAGNOSIS — X32XXXD Exposure to sunlight, subsequent encounter: Secondary | ICD-10-CM | POA: Diagnosis not present

## 2019-04-24 DIAGNOSIS — C44729 Squamous cell carcinoma of skin of left lower limb, including hip: Secondary | ICD-10-CM | POA: Diagnosis not present

## 2019-04-24 DIAGNOSIS — C44612 Basal cell carcinoma of skin of right upper limb, including shoulder: Secondary | ICD-10-CM | POA: Diagnosis not present

## 2019-04-24 DIAGNOSIS — D225 Melanocytic nevi of trunk: Secondary | ICD-10-CM | POA: Diagnosis not present

## 2019-04-24 DIAGNOSIS — L308 Other specified dermatitis: Secondary | ICD-10-CM | POA: Diagnosis not present

## 2019-04-24 DIAGNOSIS — L570 Actinic keratosis: Secondary | ICD-10-CM | POA: Diagnosis not present

## 2019-04-24 DIAGNOSIS — Z1283 Encounter for screening for malignant neoplasm of skin: Secondary | ICD-10-CM | POA: Diagnosis not present

## 2019-06-20 ENCOUNTER — Other Ambulatory Visit: Payer: Medicare Other

## 2019-06-20 ENCOUNTER — Other Ambulatory Visit: Payer: Self-pay

## 2019-06-20 DIAGNOSIS — E039 Hypothyroidism, unspecified: Secondary | ICD-10-CM

## 2019-06-20 DIAGNOSIS — Z23 Encounter for immunization: Secondary | ICD-10-CM | POA: Diagnosis not present

## 2019-06-21 LAB — TSH: TSH: 2.44 u[IU]/mL (ref 0.450–4.500)

## 2019-07-17 ENCOUNTER — Other Ambulatory Visit: Payer: Self-pay | Admitting: Physician Assistant

## 2019-07-17 DIAGNOSIS — Z08 Encounter for follow-up examination after completed treatment for malignant neoplasm: Secondary | ICD-10-CM | POA: Diagnosis not present

## 2019-07-17 DIAGNOSIS — L821 Other seborrheic keratosis: Secondary | ICD-10-CM | POA: Diagnosis not present

## 2019-07-17 DIAGNOSIS — Z85828 Personal history of other malignant neoplasm of skin: Secondary | ICD-10-CM | POA: Diagnosis not present

## 2019-07-17 DIAGNOSIS — L818 Other specified disorders of pigmentation: Secondary | ICD-10-CM | POA: Diagnosis not present

## 2019-07-24 ENCOUNTER — Other Ambulatory Visit: Payer: Self-pay

## 2019-09-12 ENCOUNTER — Other Ambulatory Visit: Payer: Self-pay

## 2019-09-13 ENCOUNTER — Ambulatory Visit (INDEPENDENT_AMBULATORY_CARE_PROVIDER_SITE_OTHER): Payer: PPO | Admitting: Physician Assistant

## 2019-09-13 ENCOUNTER — Encounter: Payer: Self-pay | Admitting: Physician Assistant

## 2019-09-13 VITALS — BP 128/77 | HR 54 | Temp 97.8°F | Ht 62.25 in | Wt 126.8 lb

## 2019-09-13 DIAGNOSIS — M150 Primary generalized (osteo)arthritis: Secondary | ICD-10-CM

## 2019-09-13 DIAGNOSIS — E039 Hypothyroidism, unspecified: Secondary | ICD-10-CM | POA: Diagnosis not present

## 2019-09-13 DIAGNOSIS — I10 Essential (primary) hypertension: Secondary | ICD-10-CM | POA: Diagnosis not present

## 2019-09-13 DIAGNOSIS — M7022 Olecranon bursitis, left elbow: Secondary | ICD-10-CM

## 2019-09-13 DIAGNOSIS — M8949 Other hypertrophic osteoarthropathy, multiple sites: Secondary | ICD-10-CM | POA: Diagnosis not present

## 2019-09-13 DIAGNOSIS — M159 Polyosteoarthritis, unspecified: Secondary | ICD-10-CM

## 2019-09-13 MED ORDER — IBUPROFEN 800 MG PO TABS: 800.0000 mg | ORAL_TABLET | Freq: Three times a day (TID) | ORAL | 2 refills | Status: DC | PRN

## 2019-09-18 ENCOUNTER — Ambulatory Visit: Payer: PPO | Attending: Internal Medicine

## 2019-09-18 DIAGNOSIS — Z23 Encounter for immunization: Secondary | ICD-10-CM

## 2019-09-18 NOTE — Progress Notes (Signed)
   Covid-19 Vaccination [facility]  Name:  Tammie Martin    MRN: [id number] DOB: Apr 05, 1951  09/18/2019  [name] was observed post Covid-19 immunization for 15 minutes without incidence. She was provided with Vaccine Information Sheet and instruction to access the V-Safe system.   [name] was instructed to call 911 with any severe reactions post vaccine: Marland Kitchen Difficulty breathing  . Swelling of your face and throat  . A fast heartbeat  . A bad rash all over your body  . Dizziness and weakness    Immunizations Administered    Name Date Dose VIS Date Route   Pfizer COVID-19 Vaccine 09/18/2019  3:45 PM 0.3 mL 08/09/2019 Intramuscular   Manufacturer: [place]   Lot: [id number]   [place]: [id number]

## 2019-09-19 ENCOUNTER — Encounter: Payer: Self-pay | Admitting: Physician Assistant

## 2019-09-19 DIAGNOSIS — M1990 Unspecified osteoarthritis, unspecified site: Secondary | ICD-10-CM | POA: Insufficient documentation

## 2019-09-19 NOTE — Progress Notes (Signed)
Acute Office Visit  Subjective:    Patient ID: Tammie Martin, female    DOB: [birth year], 69 y.o.   MRN: [id number]  Chief Complaint  Patient presents with  . Hypothyroidism    Hypertension This is a chronic problem. The current episode started more than 1 year ago. The problem is unchanged. The problem is controlled. There are no known risk factors for coronary artery disease. Past treatments include beta blockers and diuretics. Identifiable causes of hypertension include a thyroid problem.  Thyroid Problem Presents for follow-up visit. Symptoms include cold intolerance, dry skin and fatigue. Patient reports no depressed mood or diaphoresis. The symptoms have been improving.  Arthritis Presents for follow-up visit. Affected locations include the left elbow, right elbow, right knee and left knee. Associated symptoms include fatigue.   Depression screen [facility] 2/9 09/13/2019 03/12/2019 03/21/2018 11/28/2017 11/23/2017  Decreased Interest 0 0 0 0 0  Down, Depressed, Hopeless 0 0 0 0 0  PHQ - 2 Score 0 0 0 0 0  Altered sleeping - - - - -  Tired, decreased energy - - - - -  Change in appetite - - - - -  Feeling bad or failure about yourself  - - - - -  Trouble concentrating - - - - -  Moving slowly or fidgety/restless - - - - -  Suicidal thoughts - - - - -  PHQ-9 Score - - - - -     Past Medical History:  Diagnosis Date  . Depression   . DJD (degenerative joint disease)   . Hyperlipidemia   . Hypertension   . Thyroid disease     Past Surgical History:  Procedure Laterality Date  . ABDOMINAL HYSTERECTOMY    . APPENDECTOMY    . BREAST ENHANCEMENT SURGERY    . TONSILLECTOMY      Family History  Problem Relation Age of Onset  . Cancer Mother        breast cancer at 76   . Kidney disease Mother   . Cancer Maternal Aunt        colon cancer  . Heart attack Father   . Diabetes Sister   . Obesity Sister   . Depression Sister   . Alcohol abuse Brother   . Cirrhosis Brother    . Depression Brother   . Liver disease Brother   . Diabetes Daughter   . Fibromyalgia Daughter   . Depression Daughter   . Anxiety disorder Daughter   . Arthritis Daughter   . Depression Sister   . Obesity Sister     Social History   Socioeconomic History  . Marital status: Widowed    Spouse name: Not on file  . Number of children: 2  . Years of education: 73  . Highest education level: Associate degree: academic program  Occupational History  . Occupation: Retired    Comment: Equities trader  Tobacco Use  . Smoking status: Current Every Day Smoker    Packs/day: 1.00    Years: 40.00    Pack years: 40.00    Types: Cigarettes  . Smokeless tobacco: Never Used  Substance and Sexual Activity  . Alcohol use: No  . Drug use: No  . Sexual activity: Not Currently  Other Topics Concern  . Not on file  Social History Narrative  . Not on file   Social Determinants of Health   Financial Resource Strain:   . Difficulty of Paying Living Expenses: Not on file  Food Insecurity:   .  Worried About Charity fundraiser in the Last Year: Not on file  . Ran Out of Food in the Last Year: Not on file  Transportation Needs:   . Lack of Transportation (Medical): Not on file  . Lack of Transportation (Non-Medical): Not on file  Physical Activity:   . Days of Exercise per Week: Not on file  . Minutes of Exercise per Session: Not on file  Stress:   . Feeling of Stress : Not on file  Social Connections:   . Frequency of Communication with Friends and Family: Not on file  . Frequency of Social Gatherings with Friends and Family: Not on file  . Attends Religious Services: Not on file  . Active Member of Clubs or Organizations: Not on file  . Attends Archivist Meetings: Not on file  . Marital Status: Not on file  Intimate Partner Violence:   . Fear of Current or Ex-Partner: Not on file  . Emotionally Abused: Not on file  . Physically Abused: Not on file  . Sexually  Abused: Not on file    Outpatient Medications Prior to Visit  Medication Sig Dispense Refill  . betamethasone dipropionate (DIPROLENE) 0.05 % cream Apply topically 2 (two) times daily. To affected areas 45 g 0  . bisoprolol-hydrochlorothiazide (ZIAC) 10-6.25 MG tablet Take 1 tablet by mouth daily. 90 tablet 3  . calcium gluconate 500 MG tablet Take 1 tablet by mouth daily.     . cetirizine (ZYRTEC) 10 MG tablet Take 10 mg by mouth daily.    . cholecalciferol (VITAMIN D) 1000 units tablet Take 2,000 Units by mouth 2 (two) times daily.    Marland Kitchen escitalopram (LEXAPRO) 20 MG tablet Take 1 tablet (20 mg total) by mouth daily. 90 tablet 3  . Flaxseed, Linseed, (FLAX SEED OIL) 1000 MG CAPS Take 2,000 mg by mouth daily.    . Glucosamine 500 MG CAPS Take 1,000 mg by mouth 2 (two) times daily.     Marland Kitchen levothyroxine (SYNTHROID) 100 MCG tablet TAKE 1 TABLET (100 MCG TOTAL) BY MOUTH DAILY BEFORE BREAKFAST. 90 tablet 3  . lovastatin (MEVACOR) 20 MG tablet Take 1 tablet (20 mg total) by mouth at bedtime. 90 tablet 3  . Multiple Vitamins-Minerals (MULTIVITAMIN ADULT PO) Take 1 capsule by mouth daily.    . Omega-3 Fatty Acids (FISH OIL) 1000 MG CAPS Take 2 capsules by mouth daily.    . Probiotic Product (PROBIOTIC ADVANCED PO) Take by mouth daily.    . Turmeric 500 MG CAPS Take 500 mg by mouth daily.    . IBU 800 MG tablet TAKE 1 TABLET EVERY 8 HOURS AS NEEDED 90 tablet 0   No facility-administered medications prior to visit.    Allergies  Allergen Reactions  . Penicillins Hives  . Singulair [Montelukast Sodium]     headache  . Codeine Rash    Review of Systems  Constitutional: Positive for fatigue. Negative for diaphoresis.  HENT: Negative.   Eyes: Negative.   Respiratory: Negative.   Gastrointestinal: Negative.   Endocrine: Positive for cold intolerance.  Genitourinary: Negative.   Musculoskeletal: Positive for arthritis.       Objective:    Physical Exam Constitutional:      General: She  is not in acute distress.    Appearance: Normal appearance. She is well-developed.  HENT:     Head: Normocephalic and atraumatic.  Cardiovascular:     Rate and Rhythm: Normal rate.  Pulmonary:     Effort: Pulmonary  effort is normal.  Skin:    General: Skin is warm and dry.     Findings: No rash.  Neurological:     Mental Status: She is alert and oriented to person, place, and time.     Deep Tendon Reflexes: Reflexes are normal and symmetric.     BP 128/77   Pulse (!) 54   Temp 97.8 F (36.6 C) (Temporal)   Ht 5' 2.25" (1.581 m)   Wt 126 lb 12.8 oz (57.5 kg)   SpO2 98%   BMI 23.01 kg/m  Wt Readings from Last 3 Encounters:  09/13/19 126 lb 12.8 oz (57.5 kg)  03/12/19 137 lb 3.2 oz (62.2 kg)  03/21/18 135 lb (61.2 kg)    Health Maintenance Due  Topic Date Due  . Hepatitis C Screening  1951-07-09  . TETANUS/TDAP  02/06/1968  . DEXA SCAN  02/05/2014  . PNA vac Low Risk Adult (2 of 2 - PPSV23) 05/23/2018  . INFLUENZA VACCINE  03/30/2019    There are no preventive care reminders to display for this patient.   Lab Results  Component Value Date   TSH 2.440 06/20/2019   Lab Results  Component Value Date   WBC 9.7 03/05/2019   HGB 13.0 03/05/2019   HCT 38.9 03/05/2019   MCV 95 03/05/2019   PLT 368 03/05/2019   Lab Results  Component Value Date   NA 135 03/05/2019   K 4.5 03/05/2019   CO2 24 03/05/2019   GLUCOSE 97 03/05/2019   BUN 9 03/05/2019   CREATININE 0.63 03/05/2019   BILITOT 0.4 03/05/2019   ALKPHOS 74 03/05/2019   AST 13 03/05/2019   ALT 7 03/05/2019   PROT 7.0 03/05/2019   ALBUMIN 4.2 03/05/2019   CALCIUM 9.3 03/05/2019   Lab Results  Component Value Date   CHOL 188 03/05/2019   Lab Results  Component Value Date   HDL 38 (L) 03/05/2019   Lab Results  Component Value Date   LDLCALC 117 (H) 03/05/2019   Lab Results  Component Value Date   TRIG 165 (H) 03/05/2019   Lab Results  Component Value Date   CHOLHDL 4.9 (H) 03/05/2019    No results found for: HGBA1C     Assessment & Plan:   1. Olecranon bursitis of left elbow - ibuprofen (IBU) 800 MG tablet; Take 1 tablet (800 mg total) by mouth every 8 (eight) hours as needed.  Dispense: 90 tablet; Refill: 2  2. Essential hypertension, benign Continue medication  3. Hypothyroidism, unspecified type Continue medications  4. Primary osteoarthritis involving multiple joints - ibuprofen (IBU) 800 MG tablet; Take 1 tablet (800 mg total) by mouth every 8 (eight) hours as needed.  Dispense: 90 tablet; Refill: 2   Meds ordered this encounter  Medications  . ibuprofen (IBU) 800 MG tablet    Sig: Take 1 tablet (800 mg total) by mouth every 8 (eight) hours as needed.    Dispense:  90 tablet    Refill:  2    Order Specific Question:   Supervising Provider    Answer:   Janora Norlander [id number]     Terald Sleeper, PA-C

## 2019-09-25 ENCOUNTER — Other Ambulatory Visit: Payer: Self-pay | Admitting: *Deleted

## 2019-09-25 DIAGNOSIS — E785 Hyperlipidemia, unspecified: Secondary | ICD-10-CM

## 2019-09-25 DIAGNOSIS — I10 Essential (primary) hypertension: Secondary | ICD-10-CM

## 2019-09-25 MED ORDER — BISOPROLOL-HYDROCHLOROTHIAZIDE 10-6.25 MG PO TABS: 1.0000 | ORAL_TABLET | Freq: Every day | ORAL | 1 refills | Status: DC

## 2019-09-25 MED ORDER — LOVASTATIN 20 MG PO TABS: 20.0000 mg | ORAL_TABLET | Freq: Every day | ORAL | 0 refills | Status: DC

## 2019-09-25 MED ORDER — LEVOTHYROXINE SODIUM 100 MCG PO TABS: 100.0000 ug | ORAL_TABLET | Freq: Every day | ORAL | 2 refills | Status: DC

## 2019-10-09 ENCOUNTER — Ambulatory Visit: Payer: PPO | Attending: Internal Medicine

## 2019-10-09 DIAGNOSIS — Z23 Encounter for immunization: Secondary | ICD-10-CM

## 2019-10-09 NOTE — Progress Notes (Signed)
   Covid-19 Vaccination Clinic  Name:  ARNETHA ROLLO    MRN: [id number] DOB: November 18, 1950  10/09/2019  Ms. Verdin was observed post Covid-19 immunization for 15 minutes without incidence. She was provided with Vaccine Information Sheet and instruction to access the V-Safe system.   Ms. Condra was instructed to call 911 with any severe reactions post vaccine: Marland Kitchen Difficulty breathing  . Swelling of your face and throat  . A fast heartbeat  . A bad rash all over your body  . Dizziness and weakness    Immunizations Administered    Name Date Dose VIS Date Route   Pfizer COVID-19 Vaccine 10/09/2019  9:45 AM 0.3 mL 08/09/2019 Intramuscular   Manufacturer: [employer], [employer]   Lot: [id number]   [place]: [id number]

## 2019-10-23 ENCOUNTER — Other Ambulatory Visit: Payer: Self-pay | Admitting: *Deleted

## 2019-10-23 DIAGNOSIS — F411 Generalized anxiety disorder: Secondary | ICD-10-CM

## 2019-10-23 MED ORDER — ESCITALOPRAM OXALATE 20 MG PO TABS: 20.0000 mg | ORAL_TABLET | Freq: Every day | ORAL | 3 refills | Status: DC

## 2019-11-07 ENCOUNTER — Telehealth: Payer: Self-pay | Admitting: Physician Assistant

## 2019-11-07 NOTE — Chronic Care Management (AMB) (Signed)
  Chronic Care Management   Note  11/07/2019 Name: DONA WALBY MRN: [id number] DOB: 06/05/1951  MELENI DELAHUNT is a 69 y.o. year old female who is a primary care patient of Terald Sleeper, PA-C. I reached out to [name] by phone today in response to a referral sent by Ms. [name] Effinger's health plan.     Ms. Polyakov was given information about Chronic Care Management services today including:  1. CCM service includes personalized support from designated clinical staff supervised by her physician, including individualized plan of care and coordination with other care providers 2. 24/7 contact phone numbers for assistance for urgent and routine care needs. 3. Service will only be billed when office clinical staff spend 20 minutes or more in a month to coordinate care. 4. Only one practitioner may furnish and bill the service in a calendar month. 5. The patient may stop CCM services at any time (effective at the end of the month) by phone call to the office staff. 6. The patient will be responsible for cost sharing (co-pay) of up to 20% of the service fee (after annual deductible is met).  Patient agreed to services and verbal consent obtained.   Follow up plan: Telephone appointment with care management team member scheduled for: 02/07/2020.  [place], [place] [id number] Direct Dial: [phone] [email or web address]

## 2019-12-27 ENCOUNTER — Other Ambulatory Visit: Payer: Self-pay | Admitting: Family Medicine

## 2019-12-27 DIAGNOSIS — E785 Hyperlipidemia, unspecified: Secondary | ICD-10-CM

## 2020-01-14 DIAGNOSIS — M9901 Segmental and somatic dysfunction of cervical region: Secondary | ICD-10-CM | POA: Diagnosis not present

## 2020-01-14 DIAGNOSIS — M9902 Segmental and somatic dysfunction of thoracic region: Secondary | ICD-10-CM | POA: Diagnosis not present

## 2020-01-14 DIAGNOSIS — M9903 Segmental and somatic dysfunction of lumbar region: Secondary | ICD-10-CM | POA: Diagnosis not present

## 2020-02-03 ENCOUNTER — Other Ambulatory Visit: Payer: Self-pay | Admitting: Family Medicine

## 2020-02-03 ENCOUNTER — Other Ambulatory Visit: Payer: Self-pay | Admitting: *Deleted

## 2020-02-03 DIAGNOSIS — E785 Hyperlipidemia, unspecified: Secondary | ICD-10-CM

## 2020-02-03 DIAGNOSIS — Z1159 Encounter for screening for other viral diseases: Secondary | ICD-10-CM

## 2020-02-03 DIAGNOSIS — E039 Hypothyroidism, unspecified: Secondary | ICD-10-CM

## 2020-02-03 DIAGNOSIS — I10 Essential (primary) hypertension: Secondary | ICD-10-CM

## 2020-02-03 NOTE — Telephone Encounter (Signed)
Former Ronnald Ramp. NTBS 30 days given 12/27/19

## 2020-02-03 NOTE — Telephone Encounter (Signed)
Left message to call back to schedule an appt.

## 2020-02-04 ENCOUNTER — Telehealth: Payer: Self-pay | Admitting: *Deleted

## 2020-02-04 DIAGNOSIS — E785 Hyperlipidemia, unspecified: Secondary | ICD-10-CM

## 2020-02-04 MED ORDER — LOVASTATIN 20 MG PO TABS: 20.0000 mg | ORAL_TABLET | Freq: Every day | ORAL | 0 refills | Status: DC

## 2020-02-04 NOTE — Telephone Encounter (Signed)
pt has made appt for July 30 days sen to pharmacy

## 2020-02-07 ENCOUNTER — Ambulatory Visit: Payer: PPO | Admitting: *Deleted

## 2020-02-07 DIAGNOSIS — I10 Essential (primary) hypertension: Secondary | ICD-10-CM

## 2020-02-07 DIAGNOSIS — E039 Hypothyroidism, unspecified: Secondary | ICD-10-CM

## 2020-02-07 NOTE — Chronic Care Management (AMB) (Signed)
  Chronic Care Management   Outreach Note  02/07/2020 Name: Tammie Martin MRN: [id number] DOB: 01/25/1951  Referred by: Janora Norlander, DO Reason for referral : Chronic Care Management (Initial Visit)   An unsuccessful Initial Telephone Visit was attempted today. The patient was referred to the case management team for assistance with care management and care coordination. I did speak with [name] by telephone but she did not have time to do the initial telephone visit today. She requested that I reschedule her visit for another time.   Follow Up Plan: The care management team will reach out to the patient again over the next 45 days.   Chong Sicilian, BSN, RN-BC Embedded Chronic Care Manager [facility] [place] [facility] Direct Dial: [phone]

## 2020-02-11 DIAGNOSIS — M9902 Segmental and somatic dysfunction of thoracic region: Secondary | ICD-10-CM | POA: Diagnosis not present

## 2020-02-11 DIAGNOSIS — M9901 Segmental and somatic dysfunction of cervical region: Secondary | ICD-10-CM | POA: Diagnosis not present

## 2020-02-11 DIAGNOSIS — M9903 Segmental and somatic dysfunction of lumbar region: Secondary | ICD-10-CM | POA: Diagnosis not present

## 2020-03-06 ENCOUNTER — Other Ambulatory Visit: Payer: PPO

## 2020-03-06 ENCOUNTER — Other Ambulatory Visit: Payer: Self-pay

## 2020-03-06 DIAGNOSIS — E039 Hypothyroidism, unspecified: Secondary | ICD-10-CM

## 2020-03-06 DIAGNOSIS — I10 Essential (primary) hypertension: Secondary | ICD-10-CM

## 2020-03-06 DIAGNOSIS — Z1159 Encounter for screening for other viral diseases: Secondary | ICD-10-CM | POA: Diagnosis not present

## 2020-03-06 DIAGNOSIS — E785 Hyperlipidemia, unspecified: Secondary | ICD-10-CM | POA: Diagnosis not present

## 2020-03-07 LAB — CMP14+EGFR
ALT: 16 IU/L (ref 0–32)
AST: 20 IU/L (ref 0–40)
Albumin/Globulin Ratio: 1.4 (ref 1.2–2.2)
Albumin: 4.2 g/dL (ref 3.7–4.7)
Alkaline Phosphatase: 96 IU/L (ref 48–121)
BUN/Creatinine Ratio: 25 (ref 12–28)
BUN: 13 mg/dL (ref 8–27)
Bilirubin Total: 0.5 mg/dL (ref 0.0–1.2)
CO2: 27 mmol/L (ref 20–29)
Calcium: 9.3 mg/dL (ref 8.7–10.3)
Chloride: 98 mmol/L (ref 96–106)
Creatinine, Ser: 0.52 mg/dL — ABNORMAL LOW (ref 0.57–1.00)
GFR calc Af Amer: 111 mL/min/{1.73_m2} (ref >59)
GFR calc non Af Amer: 96 mL/min/{1.73_m2} (ref >59)
Globulin, Total: 3 g/dL (ref 1.5–4.5)
Glucose: 103 mg/dL — ABNORMAL HIGH (ref 65–99)
Potassium: 4.6 mmol/L (ref 3.5–5.2)
Sodium: 137 mmol/L (ref 134–144)
Total Protein: 7.2 g/dL (ref 6.0–8.5)

## 2020-03-07 LAB — THYROID PANEL WITH TSH
Free Thyroxine Index: 3.5 (ref 1.2–4.9)
T3 Uptake Ratio: 41 % — ABNORMAL HIGH (ref 24–39)
T4, Total: 8.6 ug/dL (ref 4.5–12.0)
TSH: 0.144 u[IU]/mL — ABNORMAL LOW (ref 0.450–4.500)

## 2020-03-07 LAB — CBC WITH DIFFERENTIAL/PLATELET
Basophils Absolute: 0.1 10*3/uL (ref 0.0–0.2)
Basos: 1 %
EOS (ABSOLUTE): 0.2 10*3/uL (ref 0.0–0.4)
Eos: 3 %
Hematocrit: 37.7 % (ref 34.0–46.6)
Hemoglobin: 13.1 g/dL (ref 11.1–15.9)
Immature Grans (Abs): 0 10*3/uL (ref 0.0–0.1)
Immature Granulocytes: 0 %
Lymphocytes Absolute: 2.5 10*3/uL (ref 0.7–3.1)
Lymphs: 29 %
MCH: 32.5 pg (ref 26.6–33.0)
MCHC: 34.7 g/dL (ref 31.5–35.7)
MCV: 94 fL (ref 79–97)
Monocytes Absolute: 0.6 10*3/uL (ref 0.1–0.9)
Monocytes: 7 %
Neutrophils Absolute: 5.2 10*3/uL (ref 1.4–7.0)
Neutrophils: 60 %
Platelets: 356 10*3/uL (ref 150–450)
RBC: 4.03 x10E6/uL (ref 3.77–5.28)
RDW: 11.6 % — ABNORMAL LOW (ref 11.7–15.4)
WBC: 8.5 10*3/uL (ref 3.4–10.8)

## 2020-03-07 LAB — LIPID PANEL
Chol/HDL Ratio: 3.8 ratio (ref 0.0–4.4)
Cholesterol, Total: 180 mg/dL (ref 100–199)
HDL: 48 mg/dL (ref >39)
LDL Chol Calc (NIH): 115 mg/dL — ABNORMAL HIGH (ref 0–99)
Triglycerides: 95 mg/dL (ref 0–149)
VLDL Cholesterol Cal: 17 mg/dL (ref 5–40)

## 2020-03-07 LAB — HEPATITIS C ANTIBODY: Hep C Virus Ab: <0.1 s/co ratio (ref 0.0–0.9)

## 2020-03-11 ENCOUNTER — Other Ambulatory Visit: Payer: Self-pay

## 2020-03-11 ENCOUNTER — Encounter: Payer: Self-pay | Admitting: Family Medicine

## 2020-03-11 ENCOUNTER — Ambulatory Visit (INDEPENDENT_AMBULATORY_CARE_PROVIDER_SITE_OTHER): Payer: PPO | Admitting: Family Medicine

## 2020-03-11 VITALS — BP 123/69 | HR 56 | Temp 96.9°F | Ht 62.5 in | Wt 121.0 lb

## 2020-03-11 DIAGNOSIS — I10 Essential (primary) hypertension: Secondary | ICD-10-CM

## 2020-03-11 DIAGNOSIS — E034 Atrophy of thyroid (acquired): Secondary | ICD-10-CM

## 2020-03-11 DIAGNOSIS — R011 Cardiac murmur, unspecified: Secondary | ICD-10-CM | POA: Diagnosis not present

## 2020-03-11 DIAGNOSIS — E785 Hyperlipidemia, unspecified: Secondary | ICD-10-CM | POA: Diagnosis not present

## 2020-03-11 DIAGNOSIS — R739 Hyperglycemia, unspecified: Secondary | ICD-10-CM

## 2020-03-11 DIAGNOSIS — M8580 Other specified disorders of bone density and structure, unspecified site: Secondary | ICD-10-CM

## 2020-03-11 DIAGNOSIS — F17200 Nicotine dependence, unspecified, uncomplicated: Secondary | ICD-10-CM

## 2020-03-11 MED ORDER — ATORVASTATIN CALCIUM 20 MG PO TABS: 20.0000 mg | ORAL_TABLET | Freq: Every day | ORAL | 3 refills | Status: DC

## 2020-03-11 MED ORDER — LEVOTHYROXINE SODIUM 88 MCG PO TABS: 88.0000 ug | ORAL_TABLET | Freq: Every day | ORAL | 3 refills | Status: DC

## 2020-03-11 NOTE — Progress Notes (Signed)
Subjective: CC: est care, HLD, HTN, tobacco use, hypothyroidism PCP: Janora Norlander, DO Tammie Martin is a 69 y.o. female presenting to clinic today for:  1. Hypertension with hyperlipidemia; tobacco use disorder Patient reports compliance with Ziac 10-6.25 mg daily, Mevacor 20 mg daily. No chest pain, shortness of breath, lower extremity edema. She does smoke and has been a smoker for 45+ years. She smokes about a pack and 1/2/day. No hemoptysis, unplanned weight loss, night sweats, change in exercise tolerance. She has no desire to have any cancer screening as she would not pursue any treatment for cancers.  2. Acquired hypothyroidism History: No history of radiation or surgery to the neck. Feels most comfortable with TSH around 3.5.  Denies any change in voice, difficulty swallowing, tremor, heart palpitations, change in bowel habits. She is compliant with Synthroid 100 mcg daily but does admit to about a 20 pound weight loss since she was started on this dose. She notes that she had all of her teeth removed and did not eat for about 6 months. Now, she is lost the taste for most of the foods that she used to eat.  3. Preventive care Again, patient does not wish to proceed with any mammograms (has history of breast implants) and found previous mammograms to be extremely painful. She would not want to know if she had a breast cancer anyways. Declines Tdap and pneumococcal vaccinations.   ROS: Per HPI  Allergies  Allergen Reactions  . Penicillins Hives  . Singulair [Montelukast Sodium]     headache  . Codeine Rash   Past Medical History:  Diagnosis Date  . Depression   . DJD (degenerative joint disease)   . Hyperlipidemia   . Hypertension   . Thyroid disease     Current Outpatient Medications:  .  betamethasone dipropionate (DIPROLENE) 0.05 % cream, Apply topically 2 (two) times daily. To affected areas, Disp: 45 g, Rfl: 0 .  bisoprolol-hydrochlorothiazide (ZIAC)  10-6.25 MG tablet, Take 1 tablet by mouth daily., Disp: 90 tablet, Rfl: 1 .  calcium gluconate 500 MG tablet, Take 1 tablet by mouth daily. , Disp: , Rfl:  .  cetirizine (ZYRTEC) 10 MG tablet, Take 10 mg by mouth daily., Disp: , Rfl:  .  cholecalciferol (VITAMIN D) 1000 units tablet, Take 2,000 Units by mouth 2 (two) times daily., Disp: , Rfl:  .  escitalopram (LEXAPRO) 20 MG tablet, Take 1 tablet (20 mg total) by mouth daily., Disp: 90 tablet, Rfl: 3 .  Flaxseed, Linseed, (FLAX SEED OIL) 1000 MG CAPS, Take 2,000 mg by mouth daily., Disp: , Rfl:  .  Glucosamine 500 MG CAPS, Take 1,000 mg by mouth 2 (two) times daily. , Disp: , Rfl:  .  ibuprofen (IBU) 800 MG tablet, Take 1 tablet (800 mg total) by mouth every 8 (eight) hours as needed., Disp: 90 tablet, Rfl: 2 .  levothyroxine (SYNTHROID) 100 MCG tablet, Take 1 tablet (100 mcg total) by mouth daily before breakfast., Disp: 90 tablet, Rfl: 2 .  lovastatin (MEVACOR) 20 MG tablet, Take 1 tablet (20 mg total) by mouth at bedtime., Disp: 30 tablet, Rfl: 0 .  Multiple Vitamins-Minerals (MULTIVITAMIN ADULT PO), Take 1 capsule by mouth daily., Disp: , Rfl:  .  Omega-3 Fatty Acids (FISH OIL) 1000 MG CAPS, Take 2 capsules by mouth daily., Disp: , Rfl:  .  Probiotic Product (PROBIOTIC ADVANCED PO), Take by mouth daily., Disp: , Rfl:  .  Turmeric 500 MG CAPS, Take 500  mg by mouth daily., Disp: , Rfl:  Social History   Socioeconomic History  . Marital status: Widowed    Spouse name: Not on file  . Number of children: 2  . Years of education: 64  . Highest education level: Associate degree: academic program  Occupational History  . Occupation: Retired    Comment: Equities trader  Tobacco Use  . Smoking status: Current Every Day Smoker    Packs/day: 1.00    Years: 40.00    Pack years: 40.00    Types: Cigarettes  . Smokeless tobacco: Never Used  Vaping Use  . Vaping Use: Never used  Substance and Sexual Activity  . Alcohol use: No  . Drug use:  No  . Sexual activity: Not Currently  Other Topics Concern  . Not on file  Social History Narrative  . Not on file   Social Determinants of Health   Financial Resource Strain:   . Difficulty of Paying Living Expenses:   Food Insecurity:   . Worried About Charity fundraiser in the Last Year:   . Arboriculturist in the Last Year:   Transportation Needs:   . Film/video editor (Medical):   Marland Kitchen Lack of Transportation (Non-Medical):   Physical Activity:   . Days of Exercise per Week:   . Minutes of Exercise per Session:   Stress:   . Feeling of Stress :   Social Connections:   . Frequency of Communication with Friends and Family:   . Frequency of Social Gatherings with Friends and Family:   . Attends Religious Services:   . Active Member of Clubs or Organizations:   . Attends Archivist Meetings:   Marland Kitchen Marital Status:   Intimate Partner Violence:   . Fear of Current or Ex-Partner:   . Emotionally Abused:   Marland Kitchen Physically Abused:   . Sexually Abused:    Family History  Problem Relation Age of Onset  . Cancer Mother        breast cancer at 14   . Kidney disease Mother   . Cancer Maternal Aunt        colon cancer  . Heart attack Father   . Diabetes Sister   . Obesity Sister   . Depression Sister   . Alcohol abuse Brother   . Cirrhosis Brother   . Depression Brother   . Liver disease Brother   . Diabetes Daughter   . Fibromyalgia Daughter   . Depression Daughter   . Anxiety disorder Daughter   . Arthritis Daughter   . Depression Sister   . Obesity Sister     Objective: Office vital signs reviewed. BP 123/69   Pulse (!) 56   Temp (!) 96.9 F (36.1 C) (Temporal)   Ht 5' 2.5" (1.588 m)   Wt 121 lb (54.9 kg)   SpO2 95%   BMI 21.78 kg/m   Physical Examination:  General: Awake, alert, well appearing, No acute distress; smells of tobacco HEENT: Normal; no exophthalmos. No goiter. Cardio: regular rate and rhythm, S1S2 heard, no murmurs  appreciated Pulm: clear to auscultation bilaterally, no wheezes, rhonchi or rales; normal work of breathing on room air Extremities: warm, well perfused, No edema, cyanosis or clubbing; +2 pulses bilaterally Skin: dry; intact; no rashes or lesions; normal temperature Neuro: no tremor  Assessment/ Plan: 69 y.o. female   1. Essential hypertension, benign Controlled. Continue current regimen. No refills needed  2. Hypothyroidism due to acquired atrophy of thyroid Asymptomatic.  However, TSH is suppressed. I switched her to the 88 mcg daily. Avoid biotin products. Follow-up in 6 weeks for repeat thyroid panel. We will plan to follow-up in office in 3 months, unless she needs me sooner - levothyroxine (SYNTHROID) 88 MCG tablet; Take 1 tablet (88 mcg total) by mouth daily.  Dispense: 90 tablet; Refill: 3 - Thyroid Panel With TSH; Future  3. Hyperlipidemia LDL goal <130 Switch to Lipitor 20 mg daily. Recheck fasting lipid panel and LFTs at her next in office visit in 3 months. - atorvastatin (LIPITOR) 20 MG tablet; Take 1 tablet (20 mg total) by mouth daily.  Dispense: 90 tablet; Refill: 3  4. Tobacco use disorder Counseling. Patient is contemplative. Chantix apparently recently recalled  5. Heart murmur Known heart murmur. I reviewed her 2017 echocardiogram which showed mild aortic stenosis and aortic regurgitation. Patient is asymptomatic  6. Elevated serum glucose Check A1c with thyroid panel in 6 weeks - Bayer DCA Hb A1c Waived; Future  7. Osteopenia, unspecified location Plan for DEXA scan. - DG [facility] DEXA; Future  We reviewed her labs during today's visit and a handout was provided to her.  No orders of the defined types were placed in this encounter.  No orders of the defined types were placed in this encounter.    Janora Norlander, DO [place] [phone]

## 2020-03-11 NOTE — Patient Instructions (Signed)
Blood in 6 weeks.  See me in 3 months (sooner if needed)  Atorvastatin to REPLACE Lovastatin Synthroid 74mcg to REPLACE the 183mcg

## 2020-03-16 ENCOUNTER — Encounter: Payer: Self-pay | Admitting: Family Medicine

## 2020-03-18 DIAGNOSIS — M9903 Segmental and somatic dysfunction of lumbar region: Secondary | ICD-10-CM | POA: Diagnosis not present

## 2020-03-18 DIAGNOSIS — M9902 Segmental and somatic dysfunction of thoracic region: Secondary | ICD-10-CM | POA: Diagnosis not present

## 2020-03-18 DIAGNOSIS — M9901 Segmental and somatic dysfunction of cervical region: Secondary | ICD-10-CM | POA: Diagnosis not present

## 2020-03-20 ENCOUNTER — Telehealth: Payer: Self-pay | Admitting: *Deleted

## 2020-03-20 ENCOUNTER — Ambulatory Visit: Payer: PPO | Admitting: *Deleted

## 2020-04-13 DIAGNOSIS — H25813 Combined forms of age-related cataract, bilateral: Secondary | ICD-10-CM | POA: Diagnosis not present

## 2020-04-13 DIAGNOSIS — H3561 Retinal hemorrhage, right eye: Secondary | ICD-10-CM | POA: Diagnosis not present

## 2020-04-15 DIAGNOSIS — H353211 Exudative age-related macular degeneration, right eye, with active choroidal neovascularization: Secondary | ICD-10-CM | POA: Diagnosis not present

## 2020-04-15 DIAGNOSIS — H3561 Retinal hemorrhage, right eye: Secondary | ICD-10-CM | POA: Diagnosis not present

## 2020-04-15 DIAGNOSIS — H353122 Nonexudative age-related macular degeneration, left eye, intermediate dry stage: Secondary | ICD-10-CM | POA: Diagnosis not present

## 2020-04-15 DIAGNOSIS — H43813 Vitreous degeneration, bilateral: Secondary | ICD-10-CM | POA: Diagnosis not present

## 2020-04-16 ENCOUNTER — Ambulatory Visit: Payer: PPO | Admitting: Family Medicine

## 2020-04-22 DIAGNOSIS — M9903 Segmental and somatic dysfunction of lumbar region: Secondary | ICD-10-CM | POA: Diagnosis not present

## 2020-04-22 DIAGNOSIS — M9901 Segmental and somatic dysfunction of cervical region: Secondary | ICD-10-CM | POA: Diagnosis not present

## 2020-04-22 DIAGNOSIS — M9902 Segmental and somatic dysfunction of thoracic region: Secondary | ICD-10-CM | POA: Diagnosis not present

## 2020-04-28 ENCOUNTER — Other Ambulatory Visit: Payer: Self-pay

## 2020-04-28 ENCOUNTER — Other Ambulatory Visit: Payer: PPO

## 2020-04-28 DIAGNOSIS — R739 Hyperglycemia, unspecified: Secondary | ICD-10-CM | POA: Diagnosis not present

## 2020-04-28 DIAGNOSIS — E034 Atrophy of thyroid (acquired): Secondary | ICD-10-CM

## 2020-04-28 LAB — BAYER DCA HB A1C WAIVED: HB A1C (BAYER DCA - WAIVED): 5.7 % (ref <7.0)

## 2020-04-29 DIAGNOSIS — H353211 Exudative age-related macular degeneration, right eye, with active choroidal neovascularization: Secondary | ICD-10-CM | POA: Diagnosis not present

## 2020-04-29 LAB — THYROID PANEL WITH TSH
Free Thyroxine Index: 3.8 (ref 1.2–4.9)
T3 Uptake Ratio: 42 % — ABNORMAL HIGH (ref 24–39)
T4, Total: 9.1 ug/dL (ref 4.5–12.0)
TSH: 1.11 u[IU]/mL (ref 0.450–4.500)

## 2020-05-25 DIAGNOSIS — M9902 Segmental and somatic dysfunction of thoracic region: Secondary | ICD-10-CM | POA: Diagnosis not present

## 2020-05-25 DIAGNOSIS — M9901 Segmental and somatic dysfunction of cervical region: Secondary | ICD-10-CM | POA: Diagnosis not present

## 2020-05-25 DIAGNOSIS — M9903 Segmental and somatic dysfunction of lumbar region: Secondary | ICD-10-CM | POA: Diagnosis not present

## 2020-05-26 NOTE — Chronic Care Management (AMB) (Signed)
°  Chronic Care Management   Initial Visit Note  03/20/2020 Name: Tammie Martin MRN: [id number] DOB: 1950/09/05  Referred by: Janora Norlander, DO Reason for referral : Chronic Care Management (Initial Visit)   Tammie Martin is a 69 y.o. year old female who is a primary care patient of Janora Norlander, DO. The CCM team was consulted for assistance with chronic disease management and care coordination needs related to HTN, HLD, Depression and hypothyroidism  Review of patient status, including review of consultants reports, relevant laboratory and other test results, and collaboration with appropriate care team members and the patient's provider was performed as part of comprehensive patient evaluation and provision of chronic care management services.    I spoke with [name] today regarding management of her chronic medical conditions. She does not have any CCM or resource needs and feels that her medical conditions are well managed. She appreciated the outreach but does not need CCM services at this time. She will contact the CCM team if a need arises.   Plan:   CCM enrollment status changed to "previously enrolled" as per patient request on 03/20/20 to discontinue enrollment. Case closed to case management services in primary care home.   Chong Sicilian, BSN, RN-BC Embedded Chronic Care Manager [facility] [place] [facility] Direct Dial: [phone]

## 2020-05-27 DIAGNOSIS — H43813 Vitreous degeneration, bilateral: Secondary | ICD-10-CM | POA: Diagnosis not present

## 2020-05-27 DIAGNOSIS — H353211 Exudative age-related macular degeneration, right eye, with active choroidal neovascularization: Secondary | ICD-10-CM | POA: Diagnosis not present

## 2020-05-27 DIAGNOSIS — H353122 Nonexudative age-related macular degeneration, left eye, intermediate dry stage: Secondary | ICD-10-CM | POA: Diagnosis not present

## 2020-05-27 NOTE — Telephone Encounter (Signed)
Erroneous encounter

## 2020-06-02 ENCOUNTER — Encounter: Payer: Self-pay | Admitting: Family Medicine

## 2020-06-02 ENCOUNTER — Ambulatory Visit (INDEPENDENT_AMBULATORY_CARE_PROVIDER_SITE_OTHER): Payer: PPO | Admitting: Family Medicine

## 2020-06-02 ENCOUNTER — Ambulatory Visit (INDEPENDENT_AMBULATORY_CARE_PROVIDER_SITE_OTHER): Payer: PPO

## 2020-06-02 ENCOUNTER — Other Ambulatory Visit: Payer: Self-pay

## 2020-06-02 VITALS — BP 123/74 | HR 48 | Temp 98.9°F | Ht 63.0 in | Wt 129.0 lb

## 2020-06-02 DIAGNOSIS — M7022 Olecranon bursitis, left elbow: Secondary | ICD-10-CM | POA: Diagnosis not present

## 2020-06-02 DIAGNOSIS — Z23 Encounter for immunization: Secondary | ICD-10-CM

## 2020-06-02 DIAGNOSIS — M8949 Other hypertrophic osteoarthropathy, multiple sites: Secondary | ICD-10-CM

## 2020-06-02 DIAGNOSIS — Z780 Asymptomatic menopausal state: Secondary | ICD-10-CM

## 2020-06-02 DIAGNOSIS — H3530 Unspecified macular degeneration: Secondary | ICD-10-CM

## 2020-06-02 DIAGNOSIS — L570 Actinic keratosis: Secondary | ICD-10-CM | POA: Diagnosis not present

## 2020-06-02 DIAGNOSIS — M159 Polyosteoarthritis, unspecified: Secondary | ICD-10-CM

## 2020-06-02 DIAGNOSIS — Z720 Tobacco use: Secondary | ICD-10-CM | POA: Diagnosis not present

## 2020-06-02 DIAGNOSIS — F5101 Primary insomnia: Secondary | ICD-10-CM | POA: Diagnosis not present

## 2020-06-02 DIAGNOSIS — M810 Age-related osteoporosis without current pathological fracture: Secondary | ICD-10-CM | POA: Diagnosis not present

## 2020-06-02 DIAGNOSIS — M8580 Other specified disorders of bone density and structure, unspecified site: Secondary | ICD-10-CM

## 2020-06-02 MED ORDER — IBUPROFEN 800 MG PO TABS: 800.0000 mg | ORAL_TABLET | Freq: Three times a day (TID) | ORAL | 1 refills | Status: DC | PRN

## 2020-06-02 MED ORDER — TRAZODONE HCL 50 MG PO TABS: 25.0000 mg | ORAL_TABLET | Freq: Every evening | ORAL | 3 refills | Status: DC | PRN

## 2020-06-02 NOTE — Progress Notes (Signed)
Subjective: CC: skin lesion PCP: Janora Norlander, DO YWV:PXTGG H Hunsberger is a 69 y.o. female presenting to clinic today for:  1. Skin lesion Patient reports a knotty skin lesion on the right hand that was tender but ultimately fell off on its on.  She has a history of skin cancer on that hand that was resected previously.  She has not followed up with her dermatologist but plans to do so soon.  2.  Hypothyroidism Patient had her thyroid labs collected recently and they were within normal range.  3.  Chronic joint pain Patient reports couple day per week use of ibuprofen.  She takes totally separately from her Lexapro.  No GI bleeding or abdominal pain.  4.  Macular degeneration Patient noted to be with macular degeneration in the right eye.  She is had two shots in the right eye and they do seem to be helping so far.  5.  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  She is unable to get rest until about 3 AM.  She is wanting to know if there is something she can use prescription for sleep to reset her sleep.  She has been on melatonin and OTC sleep aids but nothing has helped.   ROS: Per HPI  Allergies  Allergen Reactions  . Penicillins Hives  . Singulair [Montelukast Sodium]     headache  . Codeine Rash   Past Medical History:  Diagnosis Date  . Depression   . DJD (degenerative joint disease)   . Hyperlipidemia   . Hypertension   . Thyroid disease     Current Outpatient Medications:  .  atorvastatin (LIPITOR) 20 MG tablet, Take 1 tablet (20 mg total) by mouth daily., Disp: 90 tablet, Rfl: 3 .  betamethasone dipropionate (DIPROLENE) 0.05 % cream, Apply topically 2 (two) times daily. To affected areas, Disp: 45 g, Rfl: 0 .  bisoprolol-hydrochlorothiazide (ZIAC) 10-6.25 MG tablet, Take 1 tablet by mouth daily., Disp: 90 tablet, Rfl: 1 .  calcium gluconate 500 MG tablet, Take 1 tablet by mouth daily. , Disp: , Rfl:  .   cetirizine (ZYRTEC) 10 MG tablet, Take 10 mg by mouth daily., Disp: , Rfl:  .  cholecalciferol (VITAMIN D) 1000 units tablet, Take 2,000 Units by mouth 2 (two) times daily., Disp: , Rfl:  .  escitalopram (LEXAPRO) 20 MG tablet, Take 1 tablet (20 mg total) by mouth daily., Disp: 90 tablet, Rfl: 3 .  Flaxseed, Linseed, (FLAX SEED OIL) 1000 MG CAPS, Take 2,000 mg by mouth daily., Disp: , Rfl:  .  Glucosamine 500 MG CAPS, Take 1,000 mg by mouth 2 (two) times daily. , Disp: , Rfl:  .  ibuprofen (IBU) 800 MG tablet, Take 1 tablet (800 mg total) by mouth every 8 (eight) hours as needed., Disp: 90 tablet, Rfl: 2 .  levothyroxine (SYNTHROID) 88 MCG tablet, Take 1 tablet (88 mcg total) by mouth daily., Disp: 90 tablet, Rfl: 3 .  Multiple Vitamins-Minerals (MULTIVITAMIN ADULT PO), Take 1 capsule by mouth daily., Disp: , Rfl:  .  Omega-3 Fatty Acids (FISH OIL) 1000 MG CAPS, Take 2 capsules by mouth daily., Disp: , Rfl:  .  Probiotic Product (PROBIOTIC ADVANCED PO), Take by mouth daily., Disp: , Rfl:  .  Turmeric 500 MG CAPS, Take 500 mg by mouth daily., Disp: , Rfl:  Social History   Socioeconomic History  . Marital status: Widowed    Spouse  name: Not on file  . Number of children: 2  . Years of education: 65  . Highest education level: Associate degree: academic program  Occupational History  . Occupation: Retired    Comment: Equities trader  Tobacco Use  . Smoking status: Current Every Day Smoker    Packs/day: 1.00    Years: 40.00    Pack years: 40.00    Types: Cigarettes  . Smokeless tobacco: Never Used  Vaping Use  . Vaping Use: Never used  Substance and Sexual Activity  . Alcohol use: No  . Drug use: No  . Sexual activity: Not Currently  Other Topics Concern  . Not on file  Social History Narrative  . Not on file   Social Determinants of Health   Financial Resource Strain:   . Difficulty of Paying Living Expenses: Not on file  Food Insecurity:   . Worried About Sales executive in the Last Year: Not on file  . Ran Out of Food in the Last Year: Not on file  Transportation Needs: No Transportation Needs  . Lack of Transportation (Medical): No  . Lack of Transportation (Non-Medical): No  Physical Activity: Inactive  . Days of Exercise per Week: 0 days  . Minutes of Exercise per Session: 0 min  Stress:   . Feeling of Stress : Not on file  Social Connections:   . Frequency of Communication with Friends and Family: Not on file  . Frequency of Social Gatherings with Friends and Family: Not on file  . Attends Religious Services: Not on file  . Active Member of Clubs or Organizations: Not on file  . Attends Archivist Meetings: Not on file  . Marital Status: Not on file  Intimate Partner Violence:   . Fear of Current or Ex-Partner: Not on file  . Emotionally Abused: Not on file  . Physically Abused: Not on file  . Sexually Abused: Not on file   Family History  Problem Relation Age of Onset  . Cancer Mother        breast cancer at 72   . Kidney disease Mother   . Cancer Maternal Aunt        colon cancer  . Heart attack Father   . Diabetes Sister   . Obesity Sister   . Depression Sister   . Alcohol abuse Brother   . Cirrhosis Brother   . Depression Brother   . Liver disease Brother   . Diabetes Daughter   . Fibromyalgia Daughter   . Depression Daughter   . Anxiety disorder Daughter   . Arthritis Daughter   . Depression Sister   . Obesity Sister     Objective: Office vital signs reviewed. BP 123/74   Pulse (!) 48   Temp 98.9 F (37.2 C) (Temporal)   Ht 5\' 3"  (1.6 m)   Wt 129 lb (58.5 kg)   SpO2 97%   BMI 22.85 kg/m   Physical Examination:  General: Awake, alert, well nourished, No acute distress HEENT: Normal; sclera white.  Neck subthalamus.  No goiter Cardio: regular rate and rhythm, S1S2 heard, no murmurs appreciated Pulm: clear to auscultation bilaterally, no wheezes, rhonchi or rales; normal work of breathing on room  air Extremities: warm, well perfused, No edema, cyanosis or clubbing; +2 pulses bilaterally MSK: No gross joint swelling or deformity Skin: She has two small actinic keratosis noted along the dorsal aspect of the right hand.  Cryotherapy Procedure:  Risks and benefits of procedure  were reviewed with the patient.  Written consent obtained and scanned into the chart.  TWO (2) Lesions of concern was identified and located on dorsal aspect of right hand.  Liquid nitrogen was applied to area of concern and extending out 1 millimeters beyond the border of the lesion.  Treated area was allowed to come back to room temperature before treating it a second time.  Patient tolerated procedure well and there were no immediate complications.  Home care instructions were reviewed with the patient and a handout was provided.  Assessment/ Plan: 69 y.o. female   1. Actinic keratosis Treated with cryotherapy.  Home care instructions reviewed.  Follow-up with dermatology for ongoing skin care needs  2. Primary insomnia Trial of trazodone.  Discussed the possibility of serotonin syndrome given use of Lexapro.  She understands indications for emergent evaluation and discontinuation. - traZODone (DESYREL) 50 MG tablet; Take 0.5-1 tablets (25-50 mg total) by mouth at bedtime as needed for sleep.  Dispense: 30 tablet; Refill: 3  3. Macular degeneration of right eye, unspecified type Under care of ophthalmology  4. Tobacco use Being in the action phase of cessation.    5. Need for immunization against influenza Administered during today's visit - Flu Vaccine QUAD High Dose(Fluad)  6. Olecranon bursitis of left elbow Stable with as needed use of ibuprofen.  Caution GI bleed with SSRI - ibuprofen (IBU) 800 MG tablet; Take 1 tablet (800 mg total) by mouth every 8 (eight) hours as needed.  Dispense: 90 tablet; Refill: 1  7. Primary osteoarthritis involving multiple joints As above - ibuprofen (IBU) 800 MG  tablet; Take 1 tablet (800 mg total) by mouth every 8 (eight) hours as needed.  Dispense: 90 tablet; Refill: 1   No orders of the defined types were placed in this encounter.  No orders of the defined types were placed in this encounter.    Janora Norlander, DO [place] [phone]

## 2020-06-02 NOTE — Patient Instructions (Addendum)
Please remember to arrive to your appointment 15 minutes prior to your scheduled slot.  If you are late to future visits I may be unable to accommodate your appointment and you may be asked to reschedule.    Cryosurgery for Skin Conditions, Care After This sheet gives you information about how to care for yourself after your procedure. Your health care provider may also give you more specific instructions. If you have problems or questions, contact your health care provider. What can I expect after the procedure? After your procedure, it is common to have redness, swelling, and a blister that forms over the treated area. The blister may contain a small amount of blood. After about 2 weeks, the blister will break on its own, leaving a scab. Then the treated area will heal. After healing, there is usually little or no scarring. Follow these instructions at home: Caring for the treated area   Follow instructions from your health care provider about how to take care of the treated area. Make sure you: ? Keep the area covered with a bandage (dressing) until it heals, or for as long as told by your health care provider. ? Wash your hands with soap and water before you change your dressing. If soap and water are not available, use hand sanitizer. ? Change your dressing as told by your health care provider. ? Keep the dressing and the treated area clean and dry. If the dressing gets wet, change it right away. ? Clean the treated area with soap and water.  Check the treated area every day for signs of infection. Check for: ? More redness, swelling, or pain. ? More fluid or blood. ? Warmth. ? Pus or a bad smell. General instructions  Do not pick at your blister or try to break it open. This can cause infection and scarring.  Do not apply any medicine, cream, or lotion to the treated area unless directed by your health care provider.  Take over-the-counter and prescription medicines only as told  by your health care provider.  Keep all follow-up visits as told by your health care provider. This is important. Contact a health care provider if:  You have more redness, swelling, or pain around the treated area.  You have more fluid or blood coming from the treated area.  The treated area feels warm to the touch.  You have pus or a bad smell coming from the treated area.  Your blister becomes large and painful. Get help right away if:  You have a fever and have redness spreading from the treated area. Summary  The treated area will become red and swollen shortly after the procedure.  You should keep the treated area and your dressing clean and dry.  Check the treated area every day for signs of infection, such as fluid, pus, warmth, or having more redness, swelling, or pain.  Do not pick at your blister or try to break it open. This information is not intended to replace advice given to you by your health care provider. Make sure you discuss any questions you have with your health care provider. Document Revised: 07/28/2017 Document Reviewed: 07/04/2016 Elsevier Patient Education  2020 [employer].

## 2020-06-03 ENCOUNTER — Other Ambulatory Visit: Payer: PPO

## 2020-06-10 ENCOUNTER — Ambulatory Visit (INDEPENDENT_AMBULATORY_CARE_PROVIDER_SITE_OTHER): Payer: PPO | Admitting: *Deleted

## 2020-06-10 ENCOUNTER — Other Ambulatory Visit: Payer: Self-pay

## 2020-06-10 DIAGNOSIS — Z23 Encounter for immunization: Secondary | ICD-10-CM | POA: Diagnosis not present

## 2020-06-10 NOTE — Progress Notes (Signed)
Pneumovax given and patient tolerated well.

## 2020-06-18 ENCOUNTER — Ambulatory Visit: Payer: PPO

## 2020-06-26 DIAGNOSIS — H353122 Nonexudative age-related macular degeneration, left eye, intermediate dry stage: Secondary | ICD-10-CM | POA: Diagnosis not present

## 2020-06-26 DIAGNOSIS — H43813 Vitreous degeneration, bilateral: Secondary | ICD-10-CM | POA: Diagnosis not present

## 2020-06-26 DIAGNOSIS — H353211 Exudative age-related macular degeneration, right eye, with active choroidal neovascularization: Secondary | ICD-10-CM | POA: Diagnosis not present

## 2020-07-11 ENCOUNTER — Other Ambulatory Visit: Payer: Self-pay | Admitting: Family Medicine

## 2020-07-11 DIAGNOSIS — I10 Essential (primary) hypertension: Secondary | ICD-10-CM

## 2020-08-07 DIAGNOSIS — H353122 Nonexudative age-related macular degeneration, left eye, intermediate dry stage: Secondary | ICD-10-CM | POA: Diagnosis not present

## 2020-08-07 DIAGNOSIS — H43813 Vitreous degeneration, bilateral: Secondary | ICD-10-CM | POA: Diagnosis not present

## 2020-08-07 DIAGNOSIS — H353211 Exudative age-related macular degeneration, right eye, with active choroidal neovascularization: Secondary | ICD-10-CM | POA: Diagnosis not present

## 2020-09-17 ENCOUNTER — Telehealth: Payer: Self-pay

## 2020-09-17 DIAGNOSIS — E034 Atrophy of thyroid (acquired): Secondary | ICD-10-CM

## 2020-09-17 DIAGNOSIS — I10 Essential (primary) hypertension: Secondary | ICD-10-CM

## 2020-09-17 NOTE — Telephone Encounter (Signed)
Patient is coming in on March 2nd for appt with Dr. Darnell Level.  Can she come in a week early to get labs before the appointment.  Will you put an order in for her to do this?  Thank you!

## 2020-09-18 NOTE — Telephone Encounter (Signed)
Patient notified

## 2020-09-18 NOTE — Telephone Encounter (Signed)
Done

## 2020-10-02 DIAGNOSIS — H353122 Nonexudative age-related macular degeneration, left eye, intermediate dry stage: Secondary | ICD-10-CM | POA: Diagnosis not present

## 2020-10-02 DIAGNOSIS — H353211 Exudative age-related macular degeneration, right eye, with active choroidal neovascularization: Secondary | ICD-10-CM | POA: Diagnosis not present

## 2020-10-02 DIAGNOSIS — H43813 Vitreous degeneration, bilateral: Secondary | ICD-10-CM | POA: Diagnosis not present

## 2020-10-02 DIAGNOSIS — H2513 Age-related nuclear cataract, bilateral: Secondary | ICD-10-CM | POA: Diagnosis not present

## 2020-10-14 ENCOUNTER — Other Ambulatory Visit: Payer: Self-pay | Admitting: Family Medicine

## 2020-10-14 DIAGNOSIS — I10 Essential (primary) hypertension: Secondary | ICD-10-CM

## 2020-10-26 ENCOUNTER — Other Ambulatory Visit: Payer: Self-pay

## 2020-10-26 ENCOUNTER — Other Ambulatory Visit: Payer: PPO

## 2020-10-26 DIAGNOSIS — E034 Atrophy of thyroid (acquired): Secondary | ICD-10-CM

## 2020-10-26 DIAGNOSIS — I10 Essential (primary) hypertension: Secondary | ICD-10-CM | POA: Diagnosis not present

## 2020-10-27 ENCOUNTER — Other Ambulatory Visit: Payer: Self-pay | Admitting: Family Medicine

## 2020-10-27 DIAGNOSIS — E034 Atrophy of thyroid (acquired): Secondary | ICD-10-CM

## 2020-10-27 LAB — THYROID PANEL WITH TSH
Free Thyroxine Index: 3.6 (ref 1.2–4.9)
T3 Uptake Ratio: 41 % — ABNORMAL HIGH (ref 24–39)
T4, Total: 8.9 ug/dL (ref 4.5–12.0)
TSH: 2.56 u[IU]/mL (ref 0.450–4.500)

## 2020-10-27 LAB — BASIC METABOLIC PANEL
BUN/Creatinine Ratio: 23 (ref 12–28)
BUN: 13 mg/dL (ref 8–27)
CO2: 24 mmol/L (ref 20–29)
Calcium: 9.2 mg/dL (ref 8.7–10.3)
Chloride: 97 mmol/L (ref 96–106)
Creatinine, Ser: 0.57 mg/dL (ref 0.57–1.00)
Glucose: 108 mg/dL — ABNORMAL HIGH (ref 65–99)
Potassium: 4.5 mmol/L (ref 3.5–5.2)
Sodium: 136 mmol/L (ref 134–144)
eGFR: 97 mL/min/{1.73_m2} (ref >59)

## 2020-10-27 MED ORDER — LEVOTHYROXINE SODIUM 88 MCG PO TABS: 88.0000 ug | ORAL_TABLET | Freq: Every day | ORAL | 3 refills | Status: DC

## 2020-10-28 ENCOUNTER — Telehealth: Payer: Self-pay

## 2020-10-28 ENCOUNTER — Encounter: Payer: Self-pay | Admitting: Family Medicine

## 2020-10-28 ENCOUNTER — Other Ambulatory Visit: Payer: Self-pay

## 2020-10-28 ENCOUNTER — Ambulatory Visit (INDEPENDENT_AMBULATORY_CARE_PROVIDER_SITE_OTHER): Payer: PPO | Admitting: Family Medicine

## 2020-10-28 ENCOUNTER — Other Ambulatory Visit: Payer: Self-pay | Admitting: Family Medicine

## 2020-10-28 VITALS — BP 119/75 | HR 54 | Temp 97.6°F | Ht 63.0 in | Wt 123.0 lb

## 2020-10-28 DIAGNOSIS — J3089 Other allergic rhinitis: Secondary | ICD-10-CM | POA: Diagnosis not present

## 2020-10-28 DIAGNOSIS — E034 Atrophy of thyroid (acquired): Secondary | ICD-10-CM | POA: Diagnosis not present

## 2020-10-28 DIAGNOSIS — F5101 Primary insomnia: Secondary | ICD-10-CM

## 2020-10-28 MED ORDER — FLUTICASONE PROPIONATE 50 MCG/ACT NA SUSP
2.0000 | Freq: Every day | NASAL | 3 refills | Status: DC | PRN
Start: 2020-10-28 — End: 2023-04-10

## 2020-10-28 MED ORDER — RAMELTEON 8 MG PO TABS: 8.0000 mg | ORAL_TABLET | Freq: Every day | ORAL | 99 refills | Status: DC

## 2020-10-28 MED ORDER — BELSOMRA 10 MG PO TABS: 1.0000 | ORAL_TABLET | Freq: Every day | ORAL | 0 refills | Status: DC

## 2020-10-28 MED ORDER — BELSOMRA 15 MG PO TABS: 1.0000 | ORAL_TABLET | Freq: Every evening | ORAL | 0 refills | Status: DC | PRN

## 2020-10-28 MED ORDER — BELSOMRA 20 MG PO TABS: 1.0000 | ORAL_TABLET | Freq: Every evening | ORAL | 0 refills | Status: DC | PRN

## 2020-10-28 NOTE — Telephone Encounter (Signed)
She was prescribed belsomra due to safety profile.  Given her age, I'd be more apt to prescribe Rozerem.  She was given a coupon voucher for the belsomra.  If it is a matter of prior auth, she has failed Trazodone.

## 2020-10-28 NOTE — Patient Instructions (Signed)
Trial of Belsomra  Start with 10mg  at bedtime x10 days. If no significant improvement in sleep, go up to the 15mg  tablets. If still no significant improvement in sleep, go up to 20mg  tablets.

## 2020-10-28 NOTE — Progress Notes (Signed)
Subjective: CC: Hypothyroidism PCP: Janora Norlander, DO PIR:JJOAC H Boord is a 70 y.o. female presenting to clinic today for:  1.  Hypothyroidism/insomnia Patient is compliant with Synthroid 88 mcg daily.  Her last thyroid panel was within normal range.  No reports of tremor, heart palpitation, change in bowel habit.  She continues to have quite a bit of difficulty with insomnia and did not find the trazodone to be especially helpful despite having used it for 1 month.  What is worked best for her insomnia was half a tablet of Xanax at bedtime but she recognizes that this is not considered a safe medication.  She is also previously been on triazolam  2.  Allergic rhinitis Patient would like a renewal on her Flonase.  She was getting this over-the-counter but want to see if her insurance will pay for it  ROS: Per HPI  Allergies  Allergen Reactions  . Penicillins Hives  . Singulair [Montelukast Sodium]     headache  . Codeine Rash   Past Medical History:  Diagnosis Date  . Depression   . DJD (degenerative joint disease)   . Hyperlipidemia   . Hypertension   . Thyroid disease     Current Outpatient Medications:  .  atorvastatin (LIPITOR) 20 MG tablet, Take 1 tablet (20 mg total) by mouth daily., Disp: 90 tablet, Rfl: 3 .  betamethasone dipropionate (DIPROLENE) 0.05 % cream, Apply topically 2 (two) times daily. To affected areas, Disp: 45 g, Rfl: 0 .  bisoprolol-hydrochlorothiazide (ZIAC) 10-6.25 MG tablet, Take 1 tablet by mouth daily., Disp: 90 tablet, Rfl: 0 .  calcium gluconate 500 MG tablet, Take 1 tablet by mouth daily. , Disp: , Rfl:  .  cetirizine (ZYRTEC) 10 MG tablet, Take 10 mg by mouth daily., Disp: , Rfl:  .  cholecalciferol (VITAMIN D) 1000 units tablet, Take 2,000 Units by mouth 2 (two) times daily., Disp: , Rfl:  .  escitalopram (LEXAPRO) 20 MG tablet, Take 1 tablet (20 mg total) by mouth daily., Disp: 90 tablet, Rfl: 3 .  Flaxseed, Linseed, (FLAX SEED  OIL) 1000 MG CAPS, Take 2,000 mg by mouth daily., Disp: , Rfl:  .  Glucosamine 500 MG CAPS, Take 1,000 mg by mouth 2 (two) times daily. , Disp: , Rfl:  .  ibuprofen (IBU) 800 MG tablet, Take 1 tablet (800 mg total) by mouth every 8 (eight) hours as needed., Disp: 90 tablet, Rfl: 1 .  levothyroxine (SYNTHROID) 88 MCG tablet, Take 1 tablet (88 mcg total) by mouth daily., Disp: 90 tablet, Rfl: 3 .  Multiple Vitamins-Minerals (MULTIVITAMIN ADULT PO), Take 1 capsule by mouth daily., Disp: , Rfl:  .  Omega-3 Fatty Acids (FISH OIL) 1000 MG CAPS, Take 2 capsules by mouth daily., Disp: , Rfl:  .  Probiotic Product (PROBIOTIC ADVANCED PO), Take by mouth daily., Disp: , Rfl:  .  tobramycin (TOBREX) 0.3 % ophthalmic solution, Place into the right eye., Disp: , Rfl:  .  traZODone (DESYREL) 50 MG tablet, Take 0.5-1 tablets (25-50 mg total) by mouth at bedtime as needed for sleep., Disp: 30 tablet, Rfl: 3 .  Turmeric 500 MG CAPS, Take 500 mg by mouth daily., Disp: , Rfl:  Social History   Socioeconomic History  . Marital status: Widowed    Spouse name: Not on file  . Number of children: 2  . Years of education: 48  . Highest education level: Associate degree: academic program  Occupational History  . Occupation: Retired  Comment: Registered Nurse  Tobacco Use  . Smoking status: Current Every Day Smoker    Packs/day: 1.00    Years: 40.00    Pack years: 40.00    Types: Cigarettes  . Smokeless tobacco: Never Used  Vaping Use  . Vaping Use: Never used  Substance and Sexual Activity  . Alcohol use: No  . Drug use: No  . Sexual activity: Not Currently  Other Topics Concern  . Not on file  Social History Narrative  . Not on file   Social Determinants of Health   Financial Resource Strain: Not on file  Food Insecurity: Not on file  Transportation Needs: No Transportation Needs  . Lack of Transportation (Medical): No  . Lack of Transportation (Non-Medical): No  Physical Activity: Inactive  .  Days of Exercise per Week: 0 days  . Minutes of Exercise per Session: 0 min  Stress: Not on file  Social Connections: Not on file  Intimate Partner Violence: Not on file   Family History  Problem Relation Age of Onset  . Cancer Mother        breast cancer at 46   . Kidney disease Mother   . Cancer Maternal Aunt        colon cancer  . Heart attack Father   . Diabetes Sister   . Obesity Sister   . Depression Sister   . Alcohol abuse Brother   . Cirrhosis Brother   . Depression Brother   . Liver disease Brother   . Diabetes Daughter   . Fibromyalgia Daughter   . Depression Daughter   . Anxiety disorder Daughter   . Arthritis Daughter   . Depression Sister   . Obesity Sister     Objective: Office vital signs reviewed. BP 119/75   Pulse (!) 54   Temp 97.6 F (36.4 C) (Temporal)   Ht 5\' 3"  (1.6 m)   Wt 123 lb (55.8 kg)   SpO2 96%   BMI 21.79 kg/m   Physical Examination:  General: Awake, alert, well nourished, No acute distress HEENT: Normal; sclera white.  No exophthalmos Cardio: regular rate and rhythm, S1S2 heard, no murmurs appreciated Pulm: clear to auscultation bilaterally, no wheezes, rhonchi or rales; normal work of breathing on room air Extremities: warm, well perfused, No edema, cyanosis or clubbing; +2 pulses bilaterally MSK: norma gait and station Skin: dry; intact; no rashes or lesions; normal temperature Neuro: No tremor  Assessment/ Plan: 70 y.o. female   Hypothyroidism due to acquired atrophy of thyroid - Plan: Thyroid Panel With TSH  Primary insomnia - Plan: DISCONTINUED: Suvorexant (BELSOMRA) 10 MG TABS, DISCONTINUED: Suvorexant (BELSOMRA) 15 MG TABS, DISCONTINUED: Suvorexant (BELSOMRA) 20 MG TABS  Seasonal allergic rhinitis due to other allergic trigger - Plan: fluticasone (FLONASE) 50 MCG/ACT nasal spray  Asymptomatic from a thyroid standpoint.  We will plan to check thyroid panel at our next visit in 1 month.  Plan to trial of Belsomra  which was covered by the coupon provided but unfortunately was almost $100 per month through her insurance.  She wanted to try something else and so Rozerem has been sent instead.  If she fails this, we could consider low-dose Ambien but would like to do anything that we can to reduce fall risk in this patient with known osteopenia  Flonase sent to pharmacy for allergic rhinitis  Orders Placed This Encounter  Procedures  . Thyroid Panel With TSH    Standing Status:   Future    Standing Expiration  Date:   10/28/2021   Meds ordered this encounter  Medications  . DISCONTD: Suvorexant (BELSOMRA) 10 MG TABS    Sig: Take 1 tablet by mouth at bedtime. Has voucher    Dispense:  10 tablet    Refill:  0  . DISCONTD: Suvorexant (BELSOMRA) 15 MG TABS    Sig: Take 1 tablet by mouth at bedtime as needed.    Dispense:  10 tablet    Refill:  0  . DISCONTD: Suvorexant (BELSOMRA) 20 MG TABS    Sig: Take 1 tablet by mouth at bedtime as needed.    Dispense:  10 tablet    Refill:  0  . fluticasone (FLONASE) 50 MCG/ACT nasal spray    Sig: Place 2 sprays into both nostrils daily as needed for allergies or rhinitis.    Dispense:  48 g    Refill:  [place], [place] [phone]

## 2020-10-28 NOTE — Telephone Encounter (Signed)
Please advise, patient had appointment with you today.

## 2020-10-29 ENCOUNTER — Telehealth: Payer: Self-pay | Admitting: *Deleted

## 2020-10-29 NOTE — Telephone Encounter (Signed)
FYI _ she will try otc melatonin Per her request

## 2020-10-29 NOTE — Telephone Encounter (Signed)
Patient aware.

## 2020-10-29 NOTE — Telephone Encounter (Signed)
There is a 2nd note  - she is going to try Melatonin

## 2020-10-29 NOTE — Telephone Encounter (Signed)
Pharmacist aware and we will try to get a PA

## 2020-10-29 NOTE — Telephone Encounter (Signed)
I thought the pharmacist said it was covered.  Hm. Ok.  Melatonin is fine.  Follow up if no improvement of up to 10mg  per night in 1 month.  Can consider low dose Ambien if needed.

## 2020-10-29 NOTE — Telephone Encounter (Signed)
Ed from [employer] says that ramelteon (ROZEREM) 8 MG tablet will not cover rx. Pt can try OTC melatonin.

## 2020-11-25 ENCOUNTER — Ambulatory Visit: Payer: PPO | Admitting: Family Medicine

## 2020-11-27 ENCOUNTER — Other Ambulatory Visit: Payer: Self-pay | Admitting: *Deleted

## 2020-11-27 DIAGNOSIS — H43813 Vitreous degeneration, bilateral: Secondary | ICD-10-CM | POA: Diagnosis not present

## 2020-11-27 DIAGNOSIS — F411 Generalized anxiety disorder: Secondary | ICD-10-CM

## 2020-11-27 DIAGNOSIS — H353122 Nonexudative age-related macular degeneration, left eye, intermediate dry stage: Secondary | ICD-10-CM | POA: Diagnosis not present

## 2020-11-27 DIAGNOSIS — H353211 Exudative age-related macular degeneration, right eye, with active choroidal neovascularization: Secondary | ICD-10-CM | POA: Diagnosis not present

## 2020-11-27 MED ORDER — ESCITALOPRAM OXALATE 20 MG PO TABS: 20.0000 mg | ORAL_TABLET | Freq: Every day | ORAL | 0 refills | Status: DC

## 2020-12-09 ENCOUNTER — Other Ambulatory Visit: Payer: Self-pay | Admitting: Family Medicine

## 2020-12-09 DIAGNOSIS — E034 Atrophy of thyroid (acquired): Secondary | ICD-10-CM

## 2020-12-10 DIAGNOSIS — M9903 Segmental and somatic dysfunction of lumbar region: Secondary | ICD-10-CM | POA: Diagnosis not present

## 2020-12-10 DIAGNOSIS — M9902 Segmental and somatic dysfunction of thoracic region: Secondary | ICD-10-CM | POA: Diagnosis not present

## 2020-12-10 DIAGNOSIS — M9901 Segmental and somatic dysfunction of cervical region: Secondary | ICD-10-CM | POA: Diagnosis not present

## 2021-01-12 ENCOUNTER — Other Ambulatory Visit: Payer: Self-pay | Admitting: Family Medicine

## 2021-01-12 DIAGNOSIS — I10 Essential (primary) hypertension: Secondary | ICD-10-CM

## 2021-01-13 ENCOUNTER — Ambulatory Visit (INDEPENDENT_AMBULATORY_CARE_PROVIDER_SITE_OTHER): Payer: PPO | Admitting: Family Medicine

## 2021-01-13 ENCOUNTER — Encounter: Payer: Self-pay | Admitting: Family Medicine

## 2021-01-13 ENCOUNTER — Other Ambulatory Visit: Payer: Self-pay

## 2021-01-13 VITALS — BP 113/75 | HR 59 | Temp 97.9°F | Ht 63.0 in | Wt 121.8 lb

## 2021-01-13 DIAGNOSIS — E785 Hyperlipidemia, unspecified: Secondary | ICD-10-CM | POA: Diagnosis not present

## 2021-01-13 DIAGNOSIS — I10 Essential (primary) hypertension: Secondary | ICD-10-CM

## 2021-01-13 DIAGNOSIS — F411 Generalized anxiety disorder: Secondary | ICD-10-CM | POA: Diagnosis not present

## 2021-01-13 DIAGNOSIS — F5101 Primary insomnia: Secondary | ICD-10-CM

## 2021-01-13 DIAGNOSIS — E034 Atrophy of thyroid (acquired): Secondary | ICD-10-CM

## 2021-01-13 MED ORDER — ZOLPIDEM TARTRATE 5 MG PO TABS: 2.5000 mg | ORAL_TABLET | Freq: Every evening | ORAL | 1 refills | Status: DC | PRN

## 2021-01-13 MED ORDER — BISOPROLOL-HYDROCHLOROTHIAZIDE 10-6.25 MG PO TABS: 1.0000 | ORAL_TABLET | Freq: Every day | ORAL | 3 refills | Status: DC

## 2021-01-13 MED ORDER — ESCITALOPRAM OXALATE 20 MG PO TABS: 20.0000 mg | ORAL_TABLET | Freq: Every day | ORAL | 3 refills | Status: DC

## 2021-01-13 MED ORDER — ATORVASTATIN CALCIUM 20 MG PO TABS: 20.0000 mg | ORAL_TABLET | Freq: Every day | ORAL | 3 refills | Status: DC

## 2021-01-13 NOTE — Progress Notes (Signed)
Subjective: CC: Follow-up hypothyroidism, hypertension, insomnia PCP: Tammie Norlander, DO [name] is a 70 y.o. female presenting to clinic today for:  1.  Hypothyroidism Patient reports compliance with Synthroid 88 micrograms daily.  No reports of tremor, heart palpitations, change in bowel habit or energy.  She occasionally does have choking episodes but these have been when she was drinking something and walking at the same time.  She admits that she is probably "at fault" for these types of episodes  2.  Hypertension Patient is compliant with Ziac 10-6.25 mg daily.  No chest pain, shortness of breath, edema or falls  3.  Insomnia Patient continues to have difficulty with sleeping.  She admits that the initial medications that were prescribed were not affordable.  She tried to get melatonin but this was not especially helpful.  She would like to go ahead and proceed with the Ambien that was mentioned at last visit.  She continues on Lexapro and does need a new prescription for this   ROS: Per HPI  Allergies  Allergen Reactions  . Penicillins Hives  . Singulair [Montelukast Sodium]     headache  . Codeine Rash   Past Medical History:  Diagnosis Date  . Depression   . DJD (degenerative joint disease)   . Hyperlipidemia   . Hypertension   . Thyroid disease     Current Outpatient Medications:  .  zolpidem (AMBIEN) 5 MG tablet, Take 0.5-1 tablets (2.5-5 mg total) by mouth at bedtime as needed for sleep., Disp: 15 tablet, Rfl: 1 .  atorvastatin (LIPITOR) 20 MG tablet, Take 1 tablet (20 mg total) by mouth daily., Disp: 90 tablet, Rfl: 3 .  betamethasone dipropionate (DIPROLENE) 0.05 % cream, Apply topically 2 (two) times daily. To affected areas, Disp: 45 g, Rfl: 0 .  bisoprolol-hydrochlorothiazide (ZIAC) 10-6.25 MG tablet, Take 1 tablet by mouth daily., Disp: 90 tablet, Rfl: 0 .  calcium gluconate 500 MG tablet, Take 1 tablet by mouth daily. , Disp: , Rfl:  .   cetirizine (ZYRTEC) 10 MG tablet, Take 10 mg by mouth daily., Disp: , Rfl:  .  cholecalciferol (VITAMIN D) 1000 units tablet, Take 2,000 Units by mouth 2 (two) times daily., Disp: , Rfl:  .  escitalopram (LEXAPRO) 20 MG tablet, Take 1 tablet (20 mg total) by mouth daily. MUST HAVE OV FOR FUTURE REFILLS. CALL OFFICE, Disp: 90 tablet, Rfl: 0 .  Flaxseed, Linseed, (FLAX SEED OIL) 1000 MG CAPS, Take 2,000 mg by mouth daily., Disp: , Rfl:  .  fluticasone (FLONASE) 50 MCG/ACT nasal spray, Place 2 sprays into both nostrils daily as needed for allergies or rhinitis., Disp: 48 g, Rfl: 3 .  Glucosamine 500 MG CAPS, Take 1,000 mg by mouth 2 (two) times daily. , Disp: , Rfl:  .  ibuprofen (IBU) 800 MG tablet, Take 1 tablet (800 mg total) by mouth every 8 (eight) hours as needed., Disp: 90 tablet, Rfl: 1 .  levothyroxine (SYNTHROID) 88 MCG tablet, Take 1 tablet (88 mcg total) by mouth daily., Disp: 90 tablet, Rfl: 3 .  Multiple Vitamins-Minerals (MULTIVITAMIN ADULT PO), Take 1 capsule by mouth daily., Disp: , Rfl:  .  Omega-3 Fatty Acids (FISH OIL) 1000 MG CAPS, Take 2 capsules by mouth daily., Disp: , Rfl:  .  Probiotic Product (PROBIOTIC ADVANCED PO), Take by mouth daily., Disp: , Rfl:  .  tobramycin (TOBREX) 0.3 % ophthalmic solution, Place into the right eye., Disp: , Rfl:  .  Turmeric  500 MG CAPS, Take 500 mg by mouth daily., Disp: , Rfl:  Social History   Socioeconomic History  . Marital status: Widowed    Spouse name: Not on file  . Number of children: 2  . Years of education: 36  . Highest education level: Associate degree: academic program  Occupational History  . Occupation: Retired    Comment: Equities trader  Tobacco Use  . Smoking status: Current Every Day Smoker    Packs/day: 1.00    Years: 40.00    Pack years: 40.00    Types: Cigarettes  . Smokeless tobacco: Never Used  Vaping Use  . Vaping Use: Never used  Substance and Sexual Activity  . Alcohol use: No  . Drug use: No  .  Sexual activity: Not Currently  Other Topics Concern  . Not on file  Social History Narrative  . Not on file   Social Determinants of Health   Financial Resource Strain: Not on file  Food Insecurity: Not on file  Transportation Needs: No Transportation Needs  . Lack of Transportation (Medical): No  . Lack of Transportation (Non-Medical): No  Physical Activity: Inactive  . Days of Exercise per Week: 0 days  . Minutes of Exercise per Session: 0 min  Stress: Not on file  Social Connections: Not on file  Intimate Partner Violence: Not on file   Family History  Problem Relation Age of Onset  . Cancer Mother        breast cancer at 22   . Kidney disease Mother   . Cancer Maternal Aunt        colon cancer  . Heart attack Father   . Diabetes Sister   . Obesity Sister   . Depression Sister   . Alcohol abuse Brother   . Cirrhosis Brother   . Depression Brother   . Liver disease Brother   . Diabetes Daughter   . Fibromyalgia Daughter   . Depression Daughter   . Anxiety disorder Daughter   . Arthritis Daughter   . Depression Sister   . Obesity Sister     Objective: Office vital signs reviewed. BP 113/75   Pulse (!) 59   Temp 97.9 F (36.6 C)   Ht 5\' 3"  (1.6 m)   Wt 121 lb 12.8 oz (55.2 kg)   SpO2 95%   BMI 21.58 kg/m   Physical Examination:  General: Awake, alert, well nourished, No acute distress HEENT: Normal; goiter.  No exophthalmos Cardio: regular rate and rhythm, S1S2 heard, no murmurs appreciated Pulm: clear to auscultation bilaterally, no wheezes, rhonchi or rales; normal work of breathing on room air Extremities: warm, well perfused, No edema, cyanosis or clubbing; +2 pulses bilaterally MSK: normal gait and station Skin: dry; intact; no rashes or lesions; normal temperature Neuro: No tremor Psych: Mood stable, speech normal, affect appropriate.  Patient is pleasant and interactive.  Does not appear to be responding to internal stimuli.  Good eye  contact. Depression screen [facility] 2/9 01/13/2021 10/28/2020 06/02/2020  Decreased Interest 3 2 2   Down, Depressed, Hopeless 0 1 1  PHQ - 2 Score 3 3 3   Altered sleeping 3 1 2   Tired, decreased energy 3 1 2   Change in appetite 0 1 0  Feeling bad or failure about yourself  0 0 0  Trouble concentrating 3 0 2  Moving slowly or fidgety/restless 0 0 0  Suicidal thoughts 0 0 0  PHQ-9 Score 12 6 9   Difficult doing work/chores Somewhat difficult - -  GAD 7 : Generalized Anxiety Score 01/13/2021 10/28/2020 06/02/2020  Nervous, Anxious, on Edge 1 0 0  Control/stop worrying 0 0 0  Worry too much - different things 0 0 0  Trouble relaxing 0 0 1  Restless 0 0 0  Easily annoyed or irritable 0 0 0  Afraid - awful might happen 0 0 0  Total GAD 7 Score 1 0 1  Anxiety Difficulty Somewhat difficult - Not difficult at all   Assessment/ Plan: 70 y.o. female   Hypothyroidism due to acquired atrophy of thyroid - Plan: TSH, T4, Free  Primary insomnia - Plan: zolpidem (AMBIEN) 5 MG tablet  Generalized anxiety disorder - Plan: escitalopram (LEXAPRO) 20 MG tablet  Essential hypertension, benign - Plan: bisoprolol-hydrochlorothiazide (ZIAC) 10-6.25 MG tablet  Hyperlipidemia LDL goal <130 - Plan: atorvastatin (LIPITOR) 20 MG tablet  Asymptomatic from a thyroid standpoint.  Check TSH and free T4  Will trial Ambien 5 mg.  Advised to cut in half for first dose and see if 2.5 mg would be sufficient.  Advised risks of medications.  She is going to wait till her daughter is staying with her before she tries the medication.  Would like to follow-up with her in about 3 months for recheck of sleep.  The CSC was completed today but will need to obtain UDS at next visit if we are going to continue this medication since it is controlled.  The national narcotic database was reviewed and there were no red flags.  Anxiety disorder is stable with Lexapro.  Future refills have been sent to pharmacy  Hypertension is well  controlled.  No changes.  Continue current regimen.  Orders Placed This Encounter  Procedures  . TSH  . T4, Free   Meds ordered this encounter  Medications  . zolpidem (AMBIEN) 5 MG tablet    Sig: Take 0.5-1 tablets (2.5-5 mg total) by mouth at bedtime as needed for sleep.    Dispense:  15 tablet    Refill:  1  . escitalopram (LEXAPRO) 20 MG tablet    Sig: Take 1 tablet (20 mg total) by mouth daily.    Dispense:  90 tablet    Refill:  3  . atorvastatin (LIPITOR) 20 MG tablet    Sig: Take 1 tablet (20 mg total) by mouth daily.    Dispense:  90 tablet    Refill:  3  . bisoprolol-hydrochlorothiazide (ZIAC) 10-6.25 MG tablet    Sig: Take 1 tablet by mouth daily.    Dispense:  90 tablet    Refill:  Martin, Tammie [phone]

## 2021-01-14 LAB — TSH: TSH: 1.74 u[IU]/mL (ref 0.450–4.500)

## 2021-01-14 LAB — T4, FREE: Free T4: 1.58 ng/dL (ref 0.82–1.77)

## 2021-01-21 DIAGNOSIS — M9901 Segmental and somatic dysfunction of cervical region: Secondary | ICD-10-CM | POA: Diagnosis not present

## 2021-01-21 DIAGNOSIS — M9902 Segmental and somatic dysfunction of thoracic region: Secondary | ICD-10-CM | POA: Diagnosis not present

## 2021-01-21 DIAGNOSIS — M9903 Segmental and somatic dysfunction of lumbar region: Secondary | ICD-10-CM | POA: Diagnosis not present

## 2021-01-22 DIAGNOSIS — H353122 Nonexudative age-related macular degeneration, left eye, intermediate dry stage: Secondary | ICD-10-CM | POA: Diagnosis not present

## 2021-01-22 DIAGNOSIS — H353211 Exudative age-related macular degeneration, right eye, with active choroidal neovascularization: Secondary | ICD-10-CM | POA: Diagnosis not present

## 2021-01-22 DIAGNOSIS — H43813 Vitreous degeneration, bilateral: Secondary | ICD-10-CM | POA: Diagnosis not present

## 2021-02-18 DIAGNOSIS — M9901 Segmental and somatic dysfunction of cervical region: Secondary | ICD-10-CM | POA: Diagnosis not present

## 2021-02-18 DIAGNOSIS — M9903 Segmental and somatic dysfunction of lumbar region: Secondary | ICD-10-CM | POA: Diagnosis not present

## 2021-02-18 DIAGNOSIS — M9902 Segmental and somatic dysfunction of thoracic region: Secondary | ICD-10-CM | POA: Diagnosis not present

## 2021-03-19 DIAGNOSIS — H353122 Nonexudative age-related macular degeneration, left eye, intermediate dry stage: Secondary | ICD-10-CM | POA: Diagnosis not present

## 2021-03-19 DIAGNOSIS — H43813 Vitreous degeneration, bilateral: Secondary | ICD-10-CM | POA: Diagnosis not present

## 2021-03-19 DIAGNOSIS — H353211 Exudative age-related macular degeneration, right eye, with active choroidal neovascularization: Secondary | ICD-10-CM | POA: Diagnosis not present

## 2021-03-25 DIAGNOSIS — M9903 Segmental and somatic dysfunction of lumbar region: Secondary | ICD-10-CM | POA: Diagnosis not present

## 2021-03-25 DIAGNOSIS — M9902 Segmental and somatic dysfunction of thoracic region: Secondary | ICD-10-CM | POA: Diagnosis not present

## 2021-03-25 DIAGNOSIS — M9901 Segmental and somatic dysfunction of cervical region: Secondary | ICD-10-CM | POA: Diagnosis not present

## 2021-04-15 ENCOUNTER — Encounter: Payer: Self-pay | Admitting: Family Medicine

## 2021-04-15 ENCOUNTER — Other Ambulatory Visit: Payer: Self-pay

## 2021-04-15 ENCOUNTER — Ambulatory Visit (INDEPENDENT_AMBULATORY_CARE_PROVIDER_SITE_OTHER): Payer: PPO | Admitting: Family Medicine

## 2021-04-15 VITALS — BP 103/71 | HR 87 | Temp 97.9°F | Ht 63.0 in | Wt 121.4 lb

## 2021-04-15 DIAGNOSIS — I10 Essential (primary) hypertension: Secondary | ICD-10-CM

## 2021-04-15 DIAGNOSIS — F5101 Primary insomnia: Secondary | ICD-10-CM | POA: Diagnosis not present

## 2021-04-15 DIAGNOSIS — J449 Chronic obstructive pulmonary disease, unspecified: Secondary | ICD-10-CM

## 2021-04-15 DIAGNOSIS — E785 Hyperlipidemia, unspecified: Secondary | ICD-10-CM | POA: Diagnosis not present

## 2021-04-15 DIAGNOSIS — R5383 Other fatigue: Secondary | ICD-10-CM | POA: Diagnosis not present

## 2021-04-15 DIAGNOSIS — F329 Major depressive disorder, single episode, unspecified: Secondary | ICD-10-CM

## 2021-04-15 DIAGNOSIS — E034 Atrophy of thyroid (acquired): Secondary | ICD-10-CM | POA: Diagnosis not present

## 2021-04-15 MED ORDER — TRAZODONE HCL 50 MG PO TABS: 25.0000 mg | ORAL_TABLET | Freq: Every evening | ORAL | 3 refills | Status: DC | PRN

## 2021-04-15 MED ORDER — ALBUTEROL SULFATE HFA 108 (90 BASE) MCG/ACT IN AERS: 2.0000 | INHALATION_SPRAY | Freq: Four times a day (QID) | RESPIRATORY_TRACT | 0 refills | Status: DC | PRN

## 2021-04-15 NOTE — Patient Instructions (Signed)
Let me know if you change your mind about counseling We talked about [facility] testing today for mood medications Trazodone sent back in Trial of albuterol.  If no improvement or needing more than 3 days per week call me and I will place sampled medication up front

## 2021-04-15 NOTE — Progress Notes (Signed)
Subjective: CC: Insomnia PCP: Janora Norlander, DO QZE:SPQZR H Lacorte is a 70 y.o. female presenting to clinic today for:  1. Insomnia/depression She reports that the Ambien really did not do much for her.  She tried an old trazodone that she had and found that the full tablet did help.  She would like to get back on that.  She admits that she often has racing thoughts and does have difficulty falling asleep because she feels overly involved in the current political climate.  She is quite devastated by what is going on in [place] and is upset by the state of affairs in our own country.  She thinks that if perhaps she would just remove herself from news that she would get better.  She is also get a start back on Pinterest which used is to bring her a lot of joy.  She does report some isolating behaviors and anhedonia  2. HTN/ HLD Compliant with medications.  No chest pain.  She does report some dyspnea when it is very hot outside.  She has history of tobacco use.  No hemoptysis.  3. Hypothyroidism Compliant with Synthroid.  No reports of tremor or heart palpitations.  She reports fatigue and depressive symptoms as above   ROS: Per HPI  Allergies  Allergen Reactions   Penicillins Hives   Singulair [Montelukast Sodium]     headache   Codeine Rash   Past Medical History:  Diagnosis Date   Depression    DJD (degenerative joint disease)    Hyperlipidemia    Hypertension    Thyroid disease     Current Outpatient Medications:    atorvastatin (LIPITOR) 20 MG tablet, Take 1 tablet (20 mg total) by mouth daily., Disp: 90 tablet, Rfl: 3   bisoprolol-hydrochlorothiazide (ZIAC) 10-6.25 MG tablet, Take 1 tablet by mouth daily., Disp: 90 tablet, Rfl: 3   calcium gluconate 500 MG tablet, Take 1 tablet by mouth daily. , Disp: , Rfl:    cetirizine (ZYRTEC) 10 MG tablet, Take 10 mg by mouth daily., Disp: , Rfl:    cholecalciferol (VITAMIN D) 1000 units tablet, Take 2,000 Units by mouth 2  (two) times daily., Disp: , Rfl:    escitalopram (LEXAPRO) 20 MG tablet, Take 1 tablet (20 mg total) by mouth daily., Disp: 90 tablet, Rfl: 3   Flaxseed, Linseed, (FLAX SEED OIL) 1000 MG CAPS, Take 2,000 mg by mouth daily., Disp: , Rfl:    fluticasone (FLONASE) 50 MCG/ACT nasal spray, Place 2 sprays into both nostrils daily as needed for allergies or rhinitis., Disp: 48 g, Rfl: 3   Glucosamine 500 MG CAPS, Take 1,000 mg by mouth 2 (two) times daily. , Disp: , Rfl:    ibuprofen (IBU) 800 MG tablet, Take 1 tablet (800 mg total) by mouth every 8 (eight) hours as needed., Disp: 90 tablet, Rfl: 1   levothyroxine (SYNTHROID) 88 MCG tablet, Take 1 tablet (88 mcg total) by mouth daily., Disp: 90 tablet, Rfl: 3   Omega-3 Fatty Acids (FISH OIL) 1000 MG CAPS, Take 2 capsules by mouth daily., Disp: , Rfl:    Probiotic Product (PROBIOTIC ADVANCED PO), Take by mouth daily., Disp: , Rfl:    tobramycin (TOBREX) 0.3 % ophthalmic solution, Place into the right eye., Disp: , Rfl:    Turmeric 500 MG CAPS, Take 500 mg by mouth daily., Disp: , Rfl:    zolpidem (AMBIEN) 5 MG tablet, Take 0.5-1 tablets (2.5-5 mg total) by mouth at bedtime as needed for  sleep., Disp: 15 tablet, Rfl: 1 Social History   Socioeconomic History   Marital status: Widowed    Spouse name: Not on file   Number of children: 2   Years of education: 14   Highest education level: Associate degree: academic program  Occupational History   Occupation: Retired    Comment: Equities trader  Tobacco Use   Smoking status: Every Day    Packs/day: 1.00    Years: 40.00    Pack years: 40.00    Types: Cigarettes   Smokeless tobacco: Never  Vaping Use   Vaping Use: Never used  Substance and Sexual Activity   Alcohol use: No   Drug use: No   Sexual activity: Not Currently  Other Topics Concern   Not on file  Social History Narrative   Not on file   Social Determinants of Health   Financial Resource Strain: Not on file  Food Insecurity: Not  on file  Transportation Needs: Not on file  Physical Activity: Not on file  Stress: Not on file  Social Connections: Not on file  Intimate Partner Violence: Not on file   Family History  Problem Relation Age of Onset   Cancer Mother        breast cancer at 25    Kidney disease Mother    Cancer Maternal Aunt        colon cancer   Heart attack Father    Diabetes Sister    Obesity Sister    Depression Sister    Alcohol abuse Brother    Cirrhosis Brother    Depression Brother    Liver disease Brother    Diabetes Daughter    Fibromyalgia Daughter    Depression Daughter    Anxiety disorder Daughter    Arthritis Daughter    Depression Sister    Obesity Sister     Objective: Office vital signs reviewed. BP 103/71   Pulse 87   Temp 97.9 F (36.6 C)   Ht '5\' 3"'  (1.6 m)   Wt 121 lb 6.4 oz (55.1 kg)   SpO2 95%   BMI 21.51 kg/m   Physical Examination:  General: Awake, alert, tired appearing female, No acute distress HEENT: Normal; sclera white; no exophthalmos or goiter Cardio: regular rate and rhythm, S1S2 heard, no murmurs appreciated Pulm: Globally decreased breath sounds with mild expiratory wheeze noted.  Normal work of breathing on room air Skin: dry; intact; no rashes or lesions; normal temperature Neuro: No tremor  Assessment/ Plan: 70 y.o. female   Primary insomnia - Plan: traZODone (DESYREL) 50 MG tablet  Reactive depression  Hyperlipidemia LDL goal <130 - Plan: Lipid panel, CMP14+EGFR, CMP14+EGFR, Lipid panel  Essential hypertension, benign - Plan: CMP14+EGFR, CMP14+EGFR  Hypothyroidism due to acquired atrophy of thyroid - Plan: TSH, T4, free, T4, free, TSH  Fatigue, unspecified type - Plan: Vitamin B12  Chronic obstructive pulmonary disease, unspecified COPD type ([place]) - Plan: albuterol (VENTOLIN HFA) 108 (90 Base) MCG/ACT inhaler  Trazodone renewed.  She understands risk of serotonin syndrome with concomitant use of Lexapro.  She is considering  [facility] testing.  I offered her referral to counseling services but she declined this today.  Fasting labs ordered.  Continue statin  Blood pressure at goal.  She has if anything slightly low blood pressure.  May consider reducing her bisoprolol but unsure impact of fasting status on blood pressure today  Has some depressive symptoms and fatigue.  Otherwise asymptomatic from a thyroid standpoint.  Check thyroid levels,  check B12 level  Trial of albuterol.  Discussed consideration for controller inhaler.  She will contact me if she feels that she needs this.  Could also consider updating echocardiogram as last 1 was done in 2017.  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  No orders of the defined types were placed in this encounter.  No orders of the defined types were placed in this encounter.    Janora Norlander, DO [place] [phone]

## 2021-04-16 ENCOUNTER — Other Ambulatory Visit: Payer: Self-pay

## 2021-04-16 LAB — LIPID PANEL
Chol/HDL Ratio: 3.5 ratio (ref 0.0–4.4)
Cholesterol, Total: 144 mg/dL (ref 100–199)
HDL: 41 mg/dL (ref >39)
LDL Chol Calc (NIH): 84 mg/dL (ref 0–99)
Triglycerides: 100 mg/dL (ref 0–149)
VLDL Cholesterol Cal: 19 mg/dL (ref 5–40)

## 2021-04-16 LAB — CMP14+EGFR
ALT: 25 IU/L (ref 0–32)
AST: 20 IU/L (ref 0–40)
Albumin/Globulin Ratio: 1.5 (ref 1.2–2.2)
Albumin: 4.1 g/dL (ref 3.7–4.7)
Alkaline Phosphatase: 125 IU/L — ABNORMAL HIGH (ref 44–121)
BUN/Creatinine Ratio: 22 (ref 12–28)
BUN: 18 mg/dL (ref 8–27)
Bilirubin Total: 0.3 mg/dL (ref 0.0–1.2)
CO2: 26 mmol/L (ref 20–29)
Calcium: 9.6 mg/dL (ref 8.7–10.3)
Chloride: 96 mmol/L (ref 96–106)
Creatinine, Ser: 0.81 mg/dL (ref 0.57–1.00)
Globulin, Total: 2.8 g/dL (ref 1.5–4.5)
Glucose: 103 mg/dL — ABNORMAL HIGH (ref 65–99)
Potassium: 5 mmol/L (ref 3.5–5.2)
Sodium: 136 mmol/L (ref 134–144)
Total Protein: 6.9 g/dL (ref 6.0–8.5)
eGFR: 77 mL/min/{1.73_m2} (ref >59)

## 2021-04-16 LAB — T4, FREE: Free T4: 1.64 ng/dL (ref 0.82–1.77)

## 2021-04-16 LAB — TSH: TSH: 0.887 u[IU]/mL (ref 0.450–4.500)

## 2021-04-16 LAB — VITAMIN B12: Vitamin B-12: 483 pg/mL (ref 232–1245)

## 2021-05-06 DIAGNOSIS — M9902 Segmental and somatic dysfunction of thoracic region: Secondary | ICD-10-CM | POA: Diagnosis not present

## 2021-05-06 DIAGNOSIS — M9903 Segmental and somatic dysfunction of lumbar region: Secondary | ICD-10-CM | POA: Diagnosis not present

## 2021-05-06 DIAGNOSIS — M9901 Segmental and somatic dysfunction of cervical region: Secondary | ICD-10-CM | POA: Diagnosis not present

## 2021-05-11 DIAGNOSIS — M9902 Segmental and somatic dysfunction of thoracic region: Secondary | ICD-10-CM | POA: Diagnosis not present

## 2021-05-11 DIAGNOSIS — M9903 Segmental and somatic dysfunction of lumbar region: Secondary | ICD-10-CM | POA: Diagnosis not present

## 2021-05-11 DIAGNOSIS — M9901 Segmental and somatic dysfunction of cervical region: Secondary | ICD-10-CM | POA: Diagnosis not present

## 2021-05-26 DIAGNOSIS — H353211 Exudative age-related macular degeneration, right eye, with active choroidal neovascularization: Secondary | ICD-10-CM | POA: Diagnosis not present

## 2021-05-26 DIAGNOSIS — H353122 Nonexudative age-related macular degeneration, left eye, intermediate dry stage: Secondary | ICD-10-CM | POA: Diagnosis not present

## 2021-05-26 DIAGNOSIS — H43813 Vitreous degeneration, bilateral: Secondary | ICD-10-CM | POA: Diagnosis not present

## 2021-05-31 ENCOUNTER — Ambulatory Visit (INDEPENDENT_AMBULATORY_CARE_PROVIDER_SITE_OTHER): Payer: PPO

## 2021-05-31 ENCOUNTER — Other Ambulatory Visit: Payer: Self-pay

## 2021-05-31 DIAGNOSIS — Z23 Encounter for immunization: Secondary | ICD-10-CM

## 2021-06-03 ENCOUNTER — Ambulatory Visit (INDEPENDENT_AMBULATORY_CARE_PROVIDER_SITE_OTHER): Payer: PPO

## 2021-06-03 VITALS — Ht 63.0 in | Wt 120.0 lb

## 2021-06-03 DIAGNOSIS — Z0000 Encounter for general adult medical examination without abnormal findings: Secondary | ICD-10-CM

## 2021-06-03 DIAGNOSIS — Z1211 Encounter for screening for malignant neoplasm of colon: Secondary | ICD-10-CM

## 2021-06-03 NOTE — Progress Notes (Signed)
Subjective:   Tammie Martin is a 70 y.o. female who presents for Medicare Annual (Subsequent) preventive examination.  Virtual Visit via Telephone Note  I connected with  Tammie Martin on 06/03/21 at  1:15 PM EDT by telephone and verified that I am speaking with the correct person using two identifiers.  Location: Patient: Home Provider: [facility] Persons participating in the virtual visit: patient/Nurse Health Advisor   I discussed the limitations, risks, security and privacy concerns of performing an evaluation and management service by telephone and the availability of in person appointments. The patient expressed understanding and agreed to proceed.  Interactive audio and video telecommunications were attempted between this nurse and patient, however failed, due to patient having technical difficulties OR patient did not have access to video capability.  We continued and completed visit with audio only.  Some vital signs may be absent or patient reported.   [name], LPN   Review of Systems     Cardiac Risk Factors include: advanced age (>66men, >2 women);sedentary lifestyle;dyslipidemia;hypertension;smoking/ tobacco exposure     Objective:    Today's Vitals   06/03/21 1322  Weight: 120 lb (54.4 kg)  Height: 5\' 3"  (1.6 m)   Body mass index is 21.26 kg/m.  Advanced Directives 06/03/2021  Does Patient Have a Medical Advance Directive? No  Would patient like information on creating a medical advance directive? No - Patient declined    Current Medications (verified) Outpatient Encounter Medications as of 06/03/2021  Medication Sig   albuterol (VENTOLIN HFA) 108 (90 Base) MCG/ACT inhaler Inhale 2 puffs into the lungs every 6 (six) hours as needed for wheezing or shortness of breath.   atorvastatin (LIPITOR) 20 MG tablet Take 1 tablet (20 mg total) by mouth daily.   bisoprolol-hydrochlorothiazide (ZIAC) 10-6.25 MG tablet Take 1 tablet by mouth daily.   calcium  gluconate 500 MG tablet Take 1 tablet by mouth daily.    cetirizine (ZYRTEC) 10 MG tablet Take 10 mg by mouth daily.   cholecalciferol (VITAMIN D) 1000 units tablet Take 2,000 Units by mouth 2 (two) times daily.   escitalopram (LEXAPRO) 20 MG tablet Take 1 tablet (20 mg total) by mouth daily.   Flaxseed, Linseed, (FLAX SEED OIL) 1000 MG CAPS Take 2,000 mg by mouth daily.   fluticasone (FLONASE) 50 MCG/ACT nasal spray Place 2 sprays into both nostrils daily as needed for allergies or rhinitis.   Glucosamine 500 MG CAPS Take 1,000 mg by mouth 2 (two) times daily.    ibuprofen (IBU) 800 MG tablet Take 1 tablet (800 mg total) by mouth every 8 (eight) hours as needed.   levothyroxine (SYNTHROID) 88 MCG tablet Take 1 tablet (88 mcg total) by mouth daily.   Omega-3 Fatty Acids (FISH OIL) 1000 MG CAPS Take 2 capsules by mouth daily.   Probiotic Product (PROBIOTIC ADVANCED PO) Take by mouth daily.   tobramycin (TOBREX) 0.3 % ophthalmic solution Place into the right eye.   traZODone (DESYREL) 50 MG tablet Take 0.5-1 tablets (25-50 mg total) by mouth at bedtime as needed for sleep.   Turmeric 500 MG CAPS Take 500 mg by mouth daily.   No facility-administered encounter medications on file as of 06/03/2021.    Allergies (verified) Penicillins, Singulair [montelukast sodium], and Codeine   History: Past Medical History:  Diagnosis Date   Depression    DJD (degenerative joint disease)    Hyperlipidemia    Hypertension    Thyroid disease    Past Surgical History:  Procedure Laterality Date   ABDOMINAL HYSTERECTOMY     APPENDECTOMY     BREAST ENHANCEMENT SURGERY     TONSILLECTOMY     Family History  Problem Relation Age of Onset   Cancer Mother        breast cancer at 39    Kidney disease Mother    Cancer Maternal Aunt        colon cancer   Heart attack Father    Diabetes Sister    Obesity Sister    Depression Sister    Alcohol abuse Brother    Cirrhosis Brother    Depression Brother     Liver disease Brother    Diabetes Daughter    Fibromyalgia Daughter    Depression Daughter    Anxiety disorder Daughter    Arthritis Daughter    Depression Sister    Obesity Sister    Social History   Socioeconomic History   Marital status: Widowed    Spouse name: Not on file   Number of children: 2   Years of education: 14   Highest education level: Associate degree: academic program  Occupational History   Occupation: Retired    Comment: Equities trader  Tobacco Use   Smoking status: Every Day    Packs/day: 1.00    Years: 40.00    Pack years: 40.00    Types: Cigarettes   Smokeless tobacco: Never  Vaping Use   Vaping Use: Never used  Substance and Sexual Activity   Alcohol use: No   Drug use: No   Sexual activity: Not Currently  Other Topics Concern   Not on file  Social History Narrative   Lives alone - one cat, one dog   Social Determinants of Health   Financial Resource Strain: Low Risk    Difficulty of Paying Living Expenses: Not hard at all  Food Insecurity: No Food Insecurity   Worried About Charity fundraiser in the Last Year: Never true   [place] in the Last Year: Never true  Transportation Needs: No Transportation Needs   Lack of Transportation (Medical): No   Lack of Transportation (Non-Medical): No  Physical Activity: Inactive   Days of Exercise per Week: 0 days   Minutes of Exercise per Session: 0 min  Stress: No Stress Concern Present   Feeling of Stress : Not at all  Social Connections: Moderately Isolated   Frequency of Communication with Friends and Family: More than three times a week   Frequency of Social Gatherings with Friends and Family: More than three times a week   Attends Religious Services: Never   Marine scientist or Organizations: Yes   Attends Music therapist: More than 4 times per year   Marital Status: Widowed    Tobacco Counseling Ready to quit: Not Answered Counseling given: Not  Answered   Clinical Intake:  Pre-visit preparation completed: Yes  Pain : No/denies pain     BMI - recorded: 21.26 Nutritional Status: BMI of 19-24  Normal Nutritional Risks: None Diabetes: No  How often do you need to have someone help you when you read instructions, pamphlets, or other written materials from your doctor or pharmacy?: 1 - Never  Diabetic? No  Interpreter Needed?: No  Information entered by :: Osmin Welz, LPN   Activities of Daily Living In your present state of health, do you have any difficulty performing the following activities: 06/03/2021  Hearing? N  Vision? N  Difficulty concentrating  or making decisions? N  Walking or climbing stairs? N  Dressing or bathing? N  Doing errands, shopping? N  Preparing Food and eating ? N  Using the Toilet? N  In the past six months, have you accidently leaked urine? N  Do you have problems with loss of bowel control? N  Managing your Medications? N  Managing your Finances? N  Housekeeping or managing your Housekeeping? N  Some recent data might be hidden    Patient Care Team: Janora Norlander, DO as PCP - General (Family Medicine)  Indicate any recent Medical Services you may have received from other than [facility] providers in the past year (date may be approximate).     Assessment:   This is a routine wellness examination for [place].  Hearing/Vision screen Hearing Screening - Comments:: Denies hearing difficulties  Vision Screening - Comments:: Macular degeneration - wears eyeglasses - up to date with routine visits with Dr [facility]  Dietary issues and exercise activities discussed: Current Exercise Habits: The patient does not participate in regular exercise at present, Exercise limited by: neurologic condition(s) (sciatica)   Goals Addressed             This Visit's Progress    Exercise 150 min/wk Moderate Activity   Not on track      Depression Screen [facility] 2/9 Scores 06/03/2021 04/15/2021  01/13/2021 10/28/2020 06/02/2020 03/11/2020 09/13/2019  PHQ - 2 Score 6 6 3 3 3  0 0  PHQ- 9 Score 16 16 12 6 9  0 -    Fall Risk Fall Risk  06/03/2021 04/15/2021 01/13/2021 03/11/2020 09/13/2019  Falls in the past year? 1 1 0 1 0  Comment her dog tripped her up - - - -  Number falls in past yr: 0 0 - 1 -  Injury with Fall? 0 0 - 0 -  Risk Factor Category  - - - - -  Risk for fall due to : History of fall(s) History of fall(s) - History of fall(s) -  Follow up Falls prevention discussed Falls evaluation completed - Falls prevention discussed -    FALL RISK PREVENTION PERTAINING TO THE HOME:  Any stairs in or around the home? Yes  If so, are there any without handrails? No  Home free of loose throw rugs in walkways, pet beds, electrical cords, etc? Yes  Adequate lighting in your home to reduce risk of falls? Yes   ASSISTIVE DEVICES UTILIZED TO PREVENT FALLS:  Life alert? No  Use of a cane, walker or w/c? No  Grab bars in the bathroom? No  Shower chair or bench in shower? No  Elevated toilet seat or a handicapped toilet? No   TIMED UP AND GO:  Was the test performed? No . Telephonic visit  Cognitive Function: Normal cognitive status assessed by direct observation by this Nurse Health Advisor. No abnormalities found.   MMSE - Mini Mental State Exam 03/21/2018  Orientation to time 5  Orientation to Place 5  Registration 3  Attention/ Calculation 5  Recall 3  Language- name 2 objects 2  Language- repeat 1  Language- follow 3 step command 3  Language- read & follow direction 1  Write a sentence 1  Copy design 1  Total score 30        Immunizations Immunization History  Administered Date(s) Administered   Fluad Quad(high Dose 65+) 06/02/2020, 05/31/2021   Influenza, High Dose Seasonal PF 07/05/2017, 07/09/2018   Influenza,inj,Quad PF,6+ Mos 07/19/2016   PFIZER(Purple Top)SARS-COV-2 Vaccination  09/18/2019, 10/09/2019, 05/26/2020   Pneumococcal Conjugate-13 05/05/2016    Pneumococcal Polysaccharide-23 05/23/2013, 06/10/2020    TDAP status: Due, Education has been provided regarding the importance of this vaccine. Advised may receive this vaccine at local pharmacy or Health Dept. Aware to provide a copy of the vaccination record if obtained from local pharmacy or Health Dept. Verbalized acceptance and understanding.  Flu Vaccine status: Up to date  Pneumococcal vaccine status: Up to date  Covid-19 vaccine status: Completed vaccines  Qualifies for Shingles Vaccine? Yes   Zostavax completed No   Shingrix Completed?: No.    Education has been provided regarding the importance of this vaccine. Patient has been advised to call insurance company to determine out of pocket expense if they have not yet received this vaccine. Advised may also receive vaccine at local pharmacy or Health Dept. Verbalized acceptance and understanding.  Screening Tests Health Maintenance  Topic Date Due   Zoster Vaccines- Shingrix (1 of 2) Never done   COVID-19 Vaccine (4 - Booster for Pfizer series) 08/18/2020   Fecal DNA (Cologuard)  05/15/2021   MAMMOGRAM  01/13/2022 (Originally 05/14/2014)   TETANUS/TDAP  01/13/2022 (Originally 02/06/1970)   INFLUENZA VACCINE  Completed   DEXA SCAN  Completed   Hepatitis C Screening  Completed   HPV VACCINES  Aged Out    Health Maintenance  Health Maintenance Due  Topic Date Due   Zoster Vaccines- Shingrix (1 of 2) Never done   COVID-19 Vaccine (4 - Booster for Pfizer series) 08/18/2020   Fecal DNA (Cologuard)  05/15/2021    Colorectal cancer screening: Type of screening: Cologuard. Completed 05/15/2018. Repeat every 3 years ordered repeat today  Mammogram status: No longer required due to patient refuses.  Bone Density status: Completed 06/02/2020. Results reflect: Bone density results: OSTEOPOROSIS. Repeat every 2 years.  Lung Cancer Screening: (Low Dose CT Chest recommended if Age 19-80 years, 30 pack-year currently smoking OR have  quit w/in 15years.) does qualify.   Lung Cancer Screening Referral: to discuss with Dr Lajuana Ripple  Additional Screening:  Hepatitis C Screening: does qualify; Completed 03/06/2020  Vision Screening: Recommended annual ophthalmology exams for early detection of glaucoma and other disorders of the eye. Is the patient up to date with their annual eye exam?  Yes  Who is the provider or what is the name of the office in which the patient attends annual eye exams? [facility] If pt is not established with a provider, would they like to be referred to a provider to establish care? No .   Dental Screening: Recommended annual dental exams for proper oral hygiene  Community Resource Referral / Chronic Care Management: CRR required this visit?  No   CCM required this visit?  No      Plan:     I have personally reviewed and noted the following in the patient's chart:   Medical and social history Use of alcohol, tobacco or illicit drugs  Current medications and supplements including opioid prescriptions.  Functional ability and status Nutritional status Physical activity Advanced directives List of other physicians Hospitalizations, surgeries, and ER visits in previous 12 months Vitals Screenings to include cognitive, depression, and falls Referrals and appointments  In addition, I have reviewed and discussed with patient certain preventive protocols, quality metrics, and best practice recommendations. A written personalized care plan for preventive services as well as general preventive health recommendations were provided to patient.     Sandrea Hammond, LPN   04/04/5783   Nurse Notes: She  qualifies for Low Dose Lung Cancer Screening - discussed with patient and she is interested - needs to have discussion with pcp documented before order.

## 2021-06-03 NOTE — Patient Instructions (Signed)
Tammie Martin , Thank you for taking time to come for your Medicare Wellness Visit. I appreciate your ongoing commitment to your health goals. Please review the following plan we discussed and let me know if I can assist you in the future.   Screening recommendations/referrals: Colonoscopy: Cologuard done 05/15/2018 - Repeat every 3 years (ordered today) Mammogram: last done in 2013 - refuses annual repeats Bone Density: Done 06/02/2020 - Repeat every 2 years  Low Dose Chest CT Lung Cancer Screening: Recommended annually (discuss with Dr Lajuana Ripple)  Recommended yearly ophthalmology/optometry visit for glaucoma screening and checkup Recommended yearly dental visit for hygiene and checkup  Vaccinations: Influenza vaccine: Done 05/31/2021 - Repeat annually Pneumococcal vaccine: Done 05/05/2016 & 06/10/2020 Tdap vaccine: Due. Every 10 years Shingles vaccine: Due. 2 doses 2-6 months apart; over 90% effective   Covid-19: Done 09/18/2019,10/09/2019, 05/26/2020, & 03/15/2021  Advanced directives: Please bring a copy of your health care power of attorney and living will to the office to be added to your chart at your convenience.   Conditions/risks identified: Aim for 30 minutes of exercise or brisk walking each day, drink 6-8 glasses of water and eat lots of fruits and vegetables.  If you wish to quit smoking, help is available. For free tobacco cessation program offerings call the [facility] at (907)284-0766 or Live Well Line at 847-406-0278. You may also visit [email or web address] or email [email or web address] for more information on other programs.   You may also call 1-800-QUIT-NOW 815-818-2365) or visit www.VirusCrisis.dk or www.BecomeAnEx.org for additional resources on smoking cessation.    Next appointment: Follow up in one year for your annual wellness visit    Preventive Care 65 Years and Older, Female Preventive care refers to lifestyle choices and visits with your  health care provider that can promote health and wellness. What does preventive care include? A yearly physical exam. This is also called an annual well check. Dental exams once or twice a year. Routine eye exams. Ask your health care provider how often you should have your eyes checked. Personal lifestyle choices, including: Daily care of your teeth and gums. Regular physical activity. Eating a healthy diet. Avoiding tobacco and drug use. Limiting alcohol use. Practicing safe sex. Taking low-dose aspirin every day. Taking vitamin and mineral supplements as recommended by your health care provider. What happens during an annual well check? The services and screenings done by your health care provider during your annual well check will depend on your age, overall health, lifestyle risk factors, and family history of disease. Counseling  Your health care provider may ask you questions about your: Alcohol use. Tobacco use. Drug use. Emotional well-being. Home and relationship well-being. Sexual activity. Eating habits. History of falls. Memory and ability to understand (cognition). Work and work Statistician. Reproductive health. Screening  You may have the following tests or measurements: Height, weight, and BMI. Blood pressure. Lipid and cholesterol levels. These may be checked every 5 years, or more frequently if you are over 33 years old. Skin check. Lung cancer screening. You may have this screening every year starting at age 45 if you have a 30-pack-year history of smoking and currently smoke or have quit within the past 15 years. Fecal occult blood test (FOBT) of the stool. You may have this test every year starting at age 2. Flexible sigmoidoscopy or colonoscopy. You may have a sigmoidoscopy every 5 years or a colonoscopy every 10 years starting at age 37. Hepatitis C blood test. Hepatitis B blood  test. Sexually transmitted disease (STD) testing. Diabetes screening. This  is done by checking your blood sugar (glucose) after you have not eaten for a while (fasting). You may have this done every 1-3 years. Bone density scan. This is done to screen for osteoporosis. You may have this done starting at age 27. Mammogram. This may be done every 1-2 years. Talk to your health care provider about how often you should have regular mammograms. Talk with your health care provider about your test results, treatment options, and if necessary, the need for more tests. Vaccines  Your health care provider may recommend certain vaccines, such as: Influenza vaccine. This is recommended every year. Tetanus, diphtheria, and acellular pertussis (Tdap, Td) vaccine. You may need a Td booster every 10 years. Zoster vaccine. You may need this after age 82. Pneumococcal 13-valent conjugate (PCV13) vaccine. One dose is recommended after age 36. Pneumococcal polysaccharide (PPSV23) vaccine. One dose is recommended after age 73. Talk to your health care provider about which screenings and vaccines you need and how often you need them. This information is not intended to replace advice given to you by your health care provider. Make sure you discuss any questions you have with your health care provider. Document Released: 09/11/2015 Document Revised: 05/04/2016 Document Reviewed: 06/16/2015 Elsevier Interactive Patient Education  2017 [place] Prevention in the Home Falls can cause injuries. They can happen to people of all ages. There are many things you can do to make your home safe and to help prevent falls. What can I do on the outside of my home? Regularly fix the edges of walkways and driveways and fix any cracks. Remove anything that might make you trip as you walk through a door, such as a raised step or threshold. Trim any bushes or trees on the path to your home. Use bright outdoor lighting. Clear any walking paths of anything that might make someone trip, such as  rocks or tools. Regularly check to see if handrails are loose or broken. Make sure that both sides of any steps have handrails. Any raised decks and porches should have guardrails on the edges. Have any leaves, snow, or ice cleared regularly. Use sand or salt on walking paths during winter. Clean up any spills in your garage right away. This includes oil or grease spills. What can I do in the bathroom? Use night lights. Install grab bars by the toilet and in the tub and shower. Do not use towel bars as grab bars. Use non-skid mats or decals in the tub or shower. If you need to sit down in the shower, use a plastic, non-slip stool. Keep the floor dry. Clean up any water that spills on the floor as soon as it happens. Remove soap buildup in the tub or shower regularly. Attach bath mats securely with double-sided non-slip rug tape. Do not have throw rugs and other things on the floor that can make you trip. What can I do in the bedroom? Use night lights. Make sure that you have a light by your bed that is easy to reach. Do not use any sheets or blankets that are too big for your bed. They should not hang down onto the floor. Have a firm chair that has side arms. You can use this for support while you get dressed. Do not have throw rugs and other things on the floor that can make you trip. What can I do in the kitchen? Clean up any spills right  away. Avoid walking on wet floors. Keep items that you use a lot in easy-to-reach places. If you need to reach something above you, use a strong step stool that has a grab bar. Keep electrical cords out of the way. Do not use floor polish or wax that makes floors slippery. If you must use wax, use non-skid floor wax. Do not have throw rugs and other things on the floor that can make you trip. What can I do with my stairs? Do not leave any items on the stairs. Make sure that there are handrails on both sides of the stairs and use them. Fix handrails  that are broken or loose. Make sure that handrails are as long as the stairways. Check any carpeting to make sure that it is firmly attached to the stairs. Fix any carpet that is loose or worn. Avoid having throw rugs at the top or bottom of the stairs. If you do have throw rugs, attach them to the floor with carpet tape. Make sure that you have a light switch at the top of the stairs and the bottom of the stairs. If you do not have them, ask someone to add them for you. What else can I do to help prevent falls? Wear shoes that: Do not have high heels. Have rubber bottoms. Are comfortable and fit you well. Are closed at the toe. Do not wear sandals. If you use a stepladder: Make sure that it is fully opened. Do not climb a closed stepladder. Make sure that both sides of the stepladder are locked into place. Ask someone to hold it for you, if possible. Clearly mark and make sure that you can see: Any grab bars or handrails. First and last steps. Where the edge of each step is. Use tools that help you move around (mobility aids) if they are needed. These include: Canes. Walkers. Scooters. Crutches. Turn on the lights when you go into a dark area. Replace any light bulbs as soon as they burn out. Set up your furniture so you have a clear path. Avoid moving your furniture around. If any of your floors are uneven, fix them. If there are any pets around you, be aware of where they are. Review your medicines with your doctor. Some medicines can make you feel dizzy. This can increase your chance of falling. Ask your doctor what other things that you can do to help prevent falls. This information is not intended to replace advice given to you by your health care provider. Make sure you discuss any questions you have with your health care provider. Document Released: 06/11/2009 Document Revised: 01/21/2016 Document Reviewed: 09/19/2014 Elsevier Interactive Patient Education  2017 [employer].

## 2021-06-09 DIAGNOSIS — Z1211 Encounter for screening for malignant neoplasm of colon: Secondary | ICD-10-CM | POA: Diagnosis not present

## 2021-06-16 ENCOUNTER — Encounter: Payer: Self-pay | Admitting: Family Medicine

## 2021-06-16 ENCOUNTER — Ambulatory Visit (INDEPENDENT_AMBULATORY_CARE_PROVIDER_SITE_OTHER): Payer: PPO | Admitting: Family Medicine

## 2021-06-16 ENCOUNTER — Other Ambulatory Visit: Payer: Self-pay

## 2021-06-16 VITALS — BP 135/85 | HR 93 | Temp 97.0°F | Ht 63.0 in | Wt 120.8 lb

## 2021-06-16 DIAGNOSIS — F329 Major depressive disorder, single episode, unspecified: Secondary | ICD-10-CM | POA: Diagnosis not present

## 2021-06-16 DIAGNOSIS — F5101 Primary insomnia: Secondary | ICD-10-CM | POA: Diagnosis not present

## 2021-06-16 DIAGNOSIS — F411 Generalized anxiety disorder: Secondary | ICD-10-CM | POA: Diagnosis not present

## 2021-06-16 NOTE — Progress Notes (Signed)
Assessment & Plan:  1-3. Reactive depression/Generalized anxiety disorder/primary insomnia - genesight testing - continue medications as prescribed by PCP until they receive and interpret results   Follow up plan: Follow up with PCP  Lucile Crater, NP Student  I personally was present during the history, physical exam, and medical decision-making activities of this service and have verified that the service and findings are accurately documented in the nurse practitioner student's note.  Hendricks Limes, MSN, APRN, FNP-C [facility]   Subjective:   Patient ID: Tammie Martin, female    DOB: Dec 08, 1950, 70 y.o.   MRN: [id number]  HPI: LATRELLE FUSTON is a 70 y.o. female presenting on 06/16/2021 for genesight  She has a history of depression and is on Lexapro and Trazodone. She has tried Prozac, Effexor, and Wellbutrin in the past. She has experienced weight gain as a side effect with all of the other medications. She states she has done well with Lexapro for the most part, but is not well controlled.   Depression screen [facility] 2/9 06/16/2021 06/03/2021 04/15/2021  Decreased Interest 2 3 3   Down, Depressed, Hopeless 0 3 3  PHQ - 2 Score 2 6 6   Altered sleeping 3 3 3   Tired, decreased energy 3 3 3   Change in appetite 0 1 1  Feeling bad or failure about yourself  0 0 0  Trouble concentrating 0 3 3  Moving slowly or fidgety/restless 0 0 0  Suicidal thoughts 0 0 0  PHQ-9 Score 8 16 16   Difficult doing work/chores Somewhat difficult Somewhat difficult -  Some recent data might be hidden   GAD 7 : Generalized Anxiety Score 06/16/2021 04/15/2021 01/13/2021 10/28/2020  Nervous, Anxious, on Edge 0 0 1 0  Control/stop worrying 0 0 0 0  Worry too much - different things 0 3 0 0  Trouble relaxing 0 0 0 0  Restless 0 0 0 0  Easily annoyed or irritable 0 1 0 0  Afraid - awful might happen 0 0 0 0  Total GAD 7 Score 0 4 1 0  Anxiety Difficulty Not difficult at all  Somewhat difficult Somewhat difficult -    ROS: Negative unless specifically indicated above in HPI.   Relevant past medical history reviewed and updated as indicated.   Allergies and medications reviewed and updated.   Current Outpatient Medications:    albuterol (VENTOLIN HFA) 108 (90 Base) MCG/ACT inhaler, Inhale 2 puffs into the lungs every 6 (six) hours as needed for wheezing or shortness of breath., Disp: 8 g, Rfl: 0   atorvastatin (LIPITOR) 20 MG tablet, Take 1 tablet (20 mg total) by mouth daily., Disp: 90 tablet, Rfl: 3   bisoprolol-hydrochlorothiazide (ZIAC) 10-6.25 MG tablet, Take 1 tablet by mouth daily., Disp: 90 tablet, Rfl: 3   calcium gluconate 500 MG tablet, Take 1 tablet by mouth daily. , Disp: , Rfl:    cetirizine (ZYRTEC) 10 MG tablet, Take 10 mg by mouth daily., Disp: , Rfl:    cholecalciferol (VITAMIN D) 1000 units tablet, Take 2,000 Units by mouth 2 (two) times daily., Disp: , Rfl:    escitalopram (LEXAPRO) 20 MG tablet, Take 1 tablet (20 mg total) by mouth daily., Disp: 90 tablet, Rfl: 3   Flaxseed, Linseed, (FLAX SEED OIL) 1000 MG CAPS, Take 2,000 mg by mouth daily., Disp: , Rfl:    fluticasone (FLONASE) 50 MCG/ACT nasal spray, Place 2 sprays into both nostrils daily as needed for allergies or rhinitis., Disp:  48 g, Rfl: 3   Glucosamine 500 MG CAPS, Take 1,000 mg by mouth 2 (two) times daily. , Disp: , Rfl:    ibuprofen (IBU) 800 MG tablet, Take 1 tablet (800 mg total) by mouth every 8 (eight) hours as needed., Disp: 90 tablet, Rfl: 1   levothyroxine (SYNTHROID) 88 MCG tablet, Take 1 tablet (88 mcg total) by mouth daily., Disp: 90 tablet, Rfl: 3   Omega-3 Fatty Acids (FISH OIL) 1000 MG CAPS, Take 2 capsules by mouth daily., Disp: , Rfl:    Probiotic Product (PROBIOTIC ADVANCED PO), Take by mouth daily., Disp: , Rfl:    tobramycin (TOBREX) 0.3 % ophthalmic solution, Place into the right eye., Disp: , Rfl:    traZODone (DESYREL) 50 MG tablet, Take 0.5-1 tablets (25-50  mg total) by mouth at bedtime as needed for sleep., Disp: 90 tablet, Rfl: 3   Turmeric 500 MG CAPS, Take 500 mg by mouth daily., Disp: , Rfl:   Allergies  Allergen Reactions   Penicillins Hives   Singulair [Montelukast Sodium]     headache   Codeine Rash    Objective:   BP 135/85   Pulse 93   Temp (!) 97 F (36.1 C) (Temporal)   Ht 5\' 3"  (1.6 m)   Wt 54.8 kg   BMI 21.40 kg/m    Physical Exam Vitals reviewed.  Constitutional:      General: She is not in acute distress.    Appearance: Normal appearance. She is normal weight. She is not ill-appearing, toxic-appearing or diaphoretic.  HENT:     Head: Normocephalic and atraumatic.     Nose: Nose normal.     Mouth/Throat:     Pharynx: Oropharynx is clear.  Eyes:     Extraocular Movements: Extraocular movements intact.     Conjunctiva/sclera: Conjunctivae normal.     Pupils: Pupils are equal, round, and reactive to light.  Pulmonary:     Effort: Pulmonary effort is normal. No respiratory distress.  Abdominal:     General: There is no distension.     Palpations: Abdomen is soft. There is no mass.  Musculoskeletal:        General: Normal range of motion.     Cervical back: Normal range of motion.  Skin:    General: Skin is warm and dry.  Neurological:     General: No focal deficit present.     Mental Status: She is alert and oriented to person, place, and time.     Motor: No weakness.     Gait: Gait normal.  Psychiatric:        Mood and Affect: Mood normal.        Behavior: Behavior normal.        Thought Content: Thought content normal.        Judgment: Judgment normal.

## 2021-06-19 LAB — COLOGUARD: Cologuard: NEGATIVE

## 2021-06-30 ENCOUNTER — Ambulatory Visit (INDEPENDENT_AMBULATORY_CARE_PROVIDER_SITE_OTHER): Payer: PPO | Admitting: Family Medicine

## 2021-06-30 DIAGNOSIS — F329 Major depressive disorder, single episode, unspecified: Secondary | ICD-10-CM | POA: Diagnosis not present

## 2021-06-30 DIAGNOSIS — F411 Generalized anxiety disorder: Secondary | ICD-10-CM | POA: Diagnosis not present

## 2021-06-30 DIAGNOSIS — J449 Chronic obstructive pulmonary disease, unspecified: Secondary | ICD-10-CM

## 2021-06-30 MED ORDER — DESVENLAFAXINE SUCCINATE ER 50 MG PO TB24: 50.0000 mg | ORAL_TABLET | Freq: Every day | ORAL | 1 refills | Status: DC

## 2021-06-30 MED ORDER — UMECLIDINIUM-VILANTEROL 62.5-25 MCG/ACT IN AEPB: 1.0000 | INHALATION_SPRAY | Freq: Every day | RESPIRATORY_TRACT | 0 refills | Status: DC

## 2021-06-30 NOTE — Progress Notes (Signed)
Telephone visit  Subjective: CC: Anxiety depression, GeneSight results PCP: Janora Norlander, DO YBW:LSLHT Tammie Martin is a 70 y.o. female calls for telephone consult today. Patient provides verbal consent for consult held via phone.  Due to COVID-19 pandemic this visit was conducted virtually. This visit type was conducted due to national recommendations for restrictions regarding the COVID-19 Pandemic (e.g. social distancing, sheltering in place) in an effort to limit this patient's exposure and mitigate transmission in our community. All issues noted in this document were discussed and addressed.  A physical exam was not performed with this format.   Location of patient: home Location of provider: [facility] Others present for call: none  1.  Anxiety depression Patient reports that anxiety is stable.  She really does not have any depressive symptoms except for anhedonia.  She admits to really not feeling motivated to do anything and not taking interest in things that she normally enjoys.  She is compliant with Lexapro.  She had GeneSight testing done which showed Lexapro was actually on her significant gene drug interaction list.  She would like to transition to something else.  Uses trazodone if needed for sleep.  Wants to go on something that would not cause weight gain  2.  COPD Patient admits that she is utilizing her albuterol inhaler more than 2 times per week and would like to go ahead and try something that is a controller med.   ROS: Per HPI  Allergies  Allergen Reactions   Penicillins Hives   Singulair [Montelukast Sodium]     headache   Codeine Rash   Past Medical History:  Diagnosis Date   Depression    DJD (degenerative joint disease)    Hyperlipidemia    Hypertension    Thyroid disease     Current Outpatient Medications:    desvenlafaxine (PRISTIQ) 50 MG 24 hr tablet, Take 1 tablet (50 mg total) by mouth daily. Wean from Lexapro as discussed.  Start Pristiq  07/15/21., Disp: 30 tablet, Rfl: 1   umeclidinium-vilanterol (ANORO ELLIPTA) 62.5-25 MCG/ACT AEPB, Inhale 1 puff into the lungs daily at 6 (six) AM., Disp: 1 each, Rfl: 0   albuterol (VENTOLIN HFA) 108 (90 Base) MCG/ACT inhaler, Inhale 2 puffs into the lungs every 6 (six) hours as needed for wheezing or shortness of breath., Disp: 8 g, Rfl: 0   atorvastatin (LIPITOR) 20 MG tablet, Take 1 tablet (20 mg total) by mouth daily., Disp: 90 tablet, Rfl: 3   bisoprolol-hydrochlorothiazide (ZIAC) 10-6.25 MG tablet, Take 1 tablet by mouth daily., Disp: 90 tablet, Rfl: 3   calcium gluconate 500 MG tablet, Take 1 tablet by mouth daily. , Disp: , Rfl:    cetirizine (ZYRTEC) 10 MG tablet, Take 10 mg by mouth daily., Disp: , Rfl:    cholecalciferol (VITAMIN D) 1000 units tablet, Take 2,000 Units by mouth 2 (two) times daily., Disp: , Rfl:    Flaxseed, Linseed, (FLAX SEED OIL) 1000 MG CAPS, Take 2,000 mg by mouth daily., Disp: , Rfl:    fluticasone (FLONASE) 50 MCG/ACT nasal spray, Place 2 sprays into both nostrils daily as needed for allergies or rhinitis., Disp: 48 g, Rfl: 3   Glucosamine 500 MG CAPS, Take 1,000 mg by mouth 2 (two) times daily. , Disp: , Rfl:    ibuprofen (IBU) 800 MG tablet, Take 1 tablet (800 mg total) by mouth every 8 (eight) hours as needed., Disp: 90 tablet, Rfl: 1   levothyroxine (SYNTHROID) 88 MCG tablet, Take 1 tablet (88  mcg total) by mouth daily., Disp: 90 tablet, Rfl: 3   Omega-3 Fatty Acids (FISH OIL) 1000 MG CAPS, Take 2 capsules by mouth daily., Disp: , Rfl:    Probiotic Product (PROBIOTIC ADVANCED PO), Take by mouth daily., Disp: , Rfl:    tobramycin (TOBREX) 0.3 % ophthalmic solution, Place into the right eye., Disp: , Rfl:    traZODone (DESYREL) 50 MG tablet, Take 0.5-1 tablets (25-50 mg total) by mouth at bedtime as needed for sleep., Disp: 90 tablet, Rfl: 3   Turmeric 500 MG CAPS, Take 500 mg by mouth daily., Disp: , Rfl:   Assessment/ Plan: 70 y.o. female   Reactive  depression - Plan: desvenlafaxine (PRISTIQ) 50 MG 24 hr tablet  Generalized anxiety disorder - Plan: desvenlafaxine (PRISTIQ) 50 MG 24 hr tablet  Chronic obstructive pulmonary disease, unspecified COPD type ([place]) - Plan: umeclidinium-vilanterol (ANORO ELLIPTA) 62.5-25 MCG/ACT AEPB  Wean from Lexapro.  1/2 tablet daily x2 weeks.  Skip day, then start Pristiq 50mg  daily.  Ok to continue Trazodone if needed for sleep  Trial of Anoro.  Sample placed up front to pick up  Keep December appt.  Start time: 1:16pm End time: 1:23pm  Total time spent on patient care (including telephone call/ virtual visit): 9 minutes  [place], [place] [phone]

## 2021-08-17 ENCOUNTER — Other Ambulatory Visit: Payer: Self-pay | Admitting: Family Medicine

## 2021-08-17 DIAGNOSIS — F411 Generalized anxiety disorder: Secondary | ICD-10-CM

## 2021-08-17 DIAGNOSIS — F329 Major depressive disorder, single episode, unspecified: Secondary | ICD-10-CM

## 2021-08-24 ENCOUNTER — Encounter: Payer: Self-pay | Admitting: Family Medicine

## 2021-08-24 ENCOUNTER — Ambulatory Visit (INDEPENDENT_AMBULATORY_CARE_PROVIDER_SITE_OTHER): Payer: PPO | Admitting: Family Medicine

## 2021-08-24 VITALS — BP 123/87 | HR 73 | Temp 98.0°F | Ht 63.0 in | Wt 119.4 lb

## 2021-08-24 DIAGNOSIS — F329 Major depressive disorder, single episode, unspecified: Secondary | ICD-10-CM | POA: Diagnosis not present

## 2021-08-24 DIAGNOSIS — I4891 Unspecified atrial fibrillation: Secondary | ICD-10-CM | POA: Diagnosis not present

## 2021-08-24 DIAGNOSIS — F411 Generalized anxiety disorder: Secondary | ICD-10-CM | POA: Diagnosis not present

## 2021-08-24 MED ORDER — APIXABAN 5 MG PO TABS: 5.0000 mg | ORAL_TABLET | Freq: Two times a day (BID) | ORAL | 2 refills | Status: DC

## 2021-08-24 NOTE — Progress Notes (Signed)
Subjective: FU:XNATF palpitations PCP: Janora Norlander, DO TDD:UKGUR H Englander is a 70 y.o. female presenting to clinic today for:  1. Heart palpitations Reports that she is been having heart palpitations for the last several weeks.  She bought a wrist monitor that gives EKGs and it did noted possible arrhythmia.  She is had history of intermittent tachycardia in the past but does not see a cardiologist.  She is been trying to wean off cigarettes and currently smoking some type of herbal cigarette.  No reports of change in exercise tolerance, swelling, chest pain or shortness of breath.  2.  Depression/anxiety Patient has weaned from [place] but notes when she was off of the Lexapro to the best that she has felt in a long time.  Unfortunately, her grandson was recently killed in [place].  She is been dealing with the stress from that and does admit to some depressive symptoms as a result.  She has been having some anger outburst with her daughter.  They will be going to [place] next week to settle his estate and for his funeral.  She would like to continue trazodone but perhaps stay off of the Pristiq due to cost if possible.   ROS: Per HPI  Allergies  Allergen Reactions   Penicillins Hives   Singulair [Montelukast Sodium]     headache   Codeine Rash   Past Medical History:  Diagnosis Date   Depression    DJD (degenerative joint disease)    Hyperlipidemia    Hypertension    Thyroid disease     Current Outpatient Medications:    albuterol (VENTOLIN HFA) 108 (90 Base) MCG/ACT inhaler, Inhale 2 puffs into the lungs every 6 (six) hours as needed for wheezing or shortness of breath., Disp: 8 g, Rfl: 0   atorvastatin (LIPITOR) 20 MG tablet, Take 1 tablet (20 mg total) by mouth daily., Disp: 90 tablet, Rfl: 3   bisoprolol-hydrochlorothiazide (ZIAC) 10-6.25 MG tablet, Take 1 tablet by mouth daily., Disp: 90 tablet, Rfl: 3   calcium gluconate 500 MG tablet, Take 1 tablet by  mouth daily. , Disp: , Rfl:    cetirizine (ZYRTEC) 10 MG tablet, Take 10 mg by mouth daily., Disp: , Rfl:    cholecalciferol (VITAMIN D) 1000 units tablet, Take 2,000 Units by mouth 2 (two) times daily., Disp: , Rfl:    desvenlafaxine (PRISTIQ) 50 MG 24 hr tablet, Take 1 tablet (50 mg total) by mouth daily. Wean from Lexapro as discussed.  Start Pristiq 07/15/21., Disp: 30 tablet, Rfl: 1   Flaxseed, Linseed, (FLAX SEED OIL) 1000 MG CAPS, Take 2,000 mg by mouth daily., Disp: , Rfl:    fluticasone (FLONASE) 50 MCG/ACT nasal spray, Place 2 sprays into both nostrils daily as needed for allergies or rhinitis., Disp: 48 g, Rfl: 3   Glucosamine 500 MG CAPS, Take 1,000 mg by mouth 2 (two) times daily. , Disp: , Rfl:    ibuprofen (IBU) 800 MG tablet, Take 1 tablet (800 mg total) by mouth every 8 (eight) hours as needed., Disp: 90 tablet, Rfl: 1   levothyroxine (SYNTHROID) 88 MCG tablet, Take 1 tablet (88 mcg total) by mouth daily., Disp: 90 tablet, Rfl: 3   Omega-3 Fatty Acids (FISH OIL) 1000 MG CAPS, Take 2 capsules by mouth daily., Disp: , Rfl:    Probiotic Product (PROBIOTIC ADVANCED PO), Take by mouth daily., Disp: , Rfl:    tobramycin (TOBREX) 0.3 % ophthalmic solution, Place into the right eye., Disp: , Rfl:  traZODone (DESYREL) 50 MG tablet, Take 0.5-1 tablets (25-50 mg total) by mouth at bedtime as needed for sleep., Disp: 90 tablet, Rfl: 3   Turmeric 500 MG CAPS, Take 500 mg by mouth daily., Disp: , Rfl:    umeclidinium-vilanterol (ANORO ELLIPTA) 62.5-25 MCG/ACT AEPB, Inhale 1 puff into the lungs daily at 6 (six) AM., Disp: 1 each, Rfl: 0 Social History   Socioeconomic History   Marital status: Widowed    Spouse name: Not on file   Number of children: 2   Years of education: 14   Highest education level: Associate degree: academic program  Occupational History   Occupation: Retired    Comment: Equities trader  Tobacco Use   Smoking status: Every Day    Packs/day: 1.00    Years: 40.00     Pack years: 40.00    Types: Cigarettes   Smokeless tobacco: Never  Vaping Use   Vaping Use: Never used  Substance and Sexual Activity   Alcohol use: No   Drug use: No   Sexual activity: Not Currently  Other Topics Concern   Not on file  Social History Narrative   Lives alone - one cat, one dog   Social Determinants of Health   Financial Resource Strain: Low Risk    Difficulty of Paying Living Expenses: Not hard at all  Food Insecurity: No Food Insecurity   Worried About Charity fundraiser in the Last Year: Never true   [place] in the Last Year: Never true  Transportation Needs: No Transportation Needs   Lack of Transportation (Medical): No   Lack of Transportation (Non-Medical): No  Physical Activity: Inactive   Days of Exercise per Week: 0 days   Minutes of Exercise per Session: 0 min  Stress: No Stress Concern Present   Feeling of Stress : Not at all  Social Connections: Moderately Isolated   Frequency of Communication with Friends and Family: More than three times a week   Frequency of Social Gatherings with Friends and Family: More than three times a week   Attends Religious Services: Never   Marine scientist or Organizations: Yes   Attends Music therapist: More than 4 times per year   Marital Status: Widowed  Human resources officer Violence: Not At Risk   Fear of Current or Ex-Partner: No   Emotionally Abused: No   Physically Abused: No   Sexually Abused: No   Family History  Problem Relation Age of Onset   Cancer Mother        breast cancer at 57    Kidney disease Mother    Cancer Maternal Aunt        colon cancer   Heart attack Father    Diabetes Sister    Obesity Sister    Depression Sister    Alcohol abuse Brother    Cirrhosis Brother    Depression Brother    Liver disease Brother    Diabetes Daughter    Fibromyalgia Daughter    Depression Daughter    Anxiety disorder Daughter    Arthritis Daughter    Depression Sister     Obesity Sister     Objective: Office vital signs reviewed. BP 123/87    Pulse 73    Temp 98 F (36.7 C)    Ht _0  (1.6 m)    Wt 119 lb 6.4 oz (54.2 kg)    SpO2 98%    BMI 21.15 kg/m   Physical Examination:  General: Awake, alert, well nourished, No acute distress HEENT: No goiter or exophthalmos Cardio: Interval burst of nonsustained tachycardia appreciated on auscultation.  Heart rhythm seemingly irregularly irregular, S3E7 heard, systolic murmurs appreciated Pulm: clear to auscultation bilaterally, no wheezes, rhonchi or rales; normal work of breathing on room air  Assessment/ Plan: 70 y.o. female   Generalized anxiety disorder  Reactive depression  New onset a-fib ([place]) - Plan: EKG 12-Lead, Ambulatory referral to Cardiology, CMP14+EGFR, CBC, Magnesium, TSH, T4, Free, apixaban (ELIQUIS) 5 MG TABS tablet  Anxiety and depression are exacerbated by grieving.  Agree with discontinuation of Pristiq if she feels better off of it.  For now okay to continue low-dose trazodone.  We will consider advancing this dose to 100 mg but I would really like her to see the cardiologist before we make any changes given its potential to prolong QT and what appears to be a new onset atrial fibrillation.  Her QTC is normal range today but again do not want to exacerbate anything.  Stat referral to cardiology placed.  I have given her samples of Eliquis as well as sent in prescription to pharmacy.  We will check magnesium, TSH, free T4, CBC and CMP.  We discussed possibility of cardioversion if appropriate and likely echocardiogram.  We discussed red flag signs and symptoms warranting further evaluation and she voiced good understanding.  No orders of the defined types were placed in this encounter.  No orders of the defined types were placed in this encounter.    Janora Norlander, DO [place] [phone]

## 2021-08-25 ENCOUNTER — Telehealth: Payer: Self-pay | Admitting: Family Medicine

## 2021-08-25 LAB — CMP14+EGFR
ALT: 48 IU/L — ABNORMAL HIGH (ref 0–32)
AST: 33 IU/L (ref 0–40)
Albumin/Globulin Ratio: 1.4 (ref 1.2–2.2)
Albumin: 4.1 g/dL (ref 3.7–4.7)
Alkaline Phosphatase: 149 IU/L — ABNORMAL HIGH (ref 44–121)
BUN/Creatinine Ratio: 17 (ref 12–28)
BUN: 12 mg/dL (ref 8–27)
Bilirubin Total: 0.4 mg/dL (ref 0.0–1.2)
CO2: 28 mmol/L (ref 20–29)
Calcium: 9.3 mg/dL (ref 8.7–10.3)
Chloride: 100 mmol/L (ref 96–106)
Creatinine, Ser: 0.7 mg/dL (ref 0.57–1.00)
Globulin, Total: 2.9 g/dL (ref 1.5–4.5)
Glucose: 95 mg/dL (ref 70–99)
Potassium: 4.3 mmol/L (ref 3.5–5.2)
Sodium: 139 mmol/L (ref 134–144)
Total Protein: 7 g/dL (ref 6.0–8.5)
eGFR: 92 mL/min/{1.73_m2} (ref >59)

## 2021-08-25 LAB — CBC
Hematocrit: 43.7 % (ref 34.0–46.6)
Hemoglobin: 14.8 g/dL (ref 11.1–15.9)
MCH: 31 pg (ref 26.6–33.0)
MCHC: 33.9 g/dL (ref 31.5–35.7)
MCV: 92 fL (ref 79–97)
Platelets: 313 10*3/uL (ref 150–450)
RBC: 4.77 x10E6/uL (ref 3.77–5.28)
RDW: 12.7 % (ref 11.7–15.4)
WBC: 8.4 10*3/uL (ref 3.4–10.8)

## 2021-08-25 LAB — TSH: TSH: 1.81 u[IU]/mL (ref 0.450–4.500)

## 2021-08-25 LAB — MAGNESIUM: Magnesium: 1.8 mg/dL (ref 1.6–2.3)

## 2021-08-25 LAB — T4, FREE: Free T4: 1.54 ng/dL (ref 0.82–1.77)

## 2021-08-25 NOTE — Telephone Encounter (Signed)
Pt called stating that she needs [place] to send her referral to [place], [place] [facility], or [place], as requested because she cant drive in [place] because she has macular degeneration.  Asked pt if she has anyone who could drive her to her [facility] appt so that we didn't have to change it, because a lot of specialists book out and her current appt in [place] location is on 09/17/2021. Pt says she has a sister who already has to drive her to other appts so she doesn't want to ask her to do that.  Explained to pt that I would send this message to referrals to get it sent somewhere in [place], [place], or [place], but also explained that she may have to wait an additional 1 to 2 months to get an appt to those locations.  Please call patient with update on referral when available.

## 2021-09-08 ENCOUNTER — Encounter: Payer: Self-pay | Admitting: Family Medicine

## 2021-09-08 ENCOUNTER — Other Ambulatory Visit: Payer: Self-pay | Admitting: Family Medicine

## 2021-09-08 DIAGNOSIS — F411 Generalized anxiety disorder: Secondary | ICD-10-CM

## 2021-09-08 DIAGNOSIS — F329 Major depressive disorder, single episode, unspecified: Secondary | ICD-10-CM

## 2021-09-08 MED ORDER — DESVENLAFAXINE SUCCINATE ER 50 MG PO TB24: 50.0000 mg | ORAL_TABLET | Freq: Every day | ORAL | 1 refills | Status: DC

## 2021-09-10 ENCOUNTER — Emergency Department (HOSPITAL_COMMUNITY): Payer: Medicare HMO

## 2021-09-10 ENCOUNTER — Emergency Department (HOSPITAL_COMMUNITY)
Admission: EM | Admit: 2021-09-10 | Discharge: 2021-09-10 | Disposition: A | Discharge status: Home / Self Care | Payer: Medicare HMO | Attending: Emergency Medicine | Admitting: Emergency Medicine

## 2021-09-10 ENCOUNTER — Encounter (HOSPITAL_COMMUNITY): Payer: Self-pay | Admitting: *Deleted

## 2021-09-10 DIAGNOSIS — Z7951 Long term (current) use of inhaled steroids: Secondary | ICD-10-CM | POA: Diagnosis not present

## 2021-09-10 DIAGNOSIS — I509 Heart failure, unspecified: Secondary | ICD-10-CM | POA: Diagnosis not present

## 2021-09-10 DIAGNOSIS — Z79899 Other long term (current) drug therapy: Secondary | ICD-10-CM | POA: Insufficient documentation

## 2021-09-10 DIAGNOSIS — Z7901 Long term (current) use of anticoagulants: Secondary | ICD-10-CM | POA: Insufficient documentation

## 2021-09-10 DIAGNOSIS — R0602 Shortness of breath: Secondary | ICD-10-CM | POA: Diagnosis not present

## 2021-09-10 DIAGNOSIS — M7989 Other specified soft tissue disorders: Secondary | ICD-10-CM | POA: Diagnosis not present

## 2021-09-10 DIAGNOSIS — M7122 Synovial cyst of popliteal space [Baker], left knee: Secondary | ICD-10-CM | POA: Diagnosis not present

## 2021-09-10 DIAGNOSIS — M79604 Pain in right leg: Secondary | ICD-10-CM | POA: Diagnosis not present

## 2021-09-10 DIAGNOSIS — I110 Hypertensive heart disease with heart failure: Secondary | ICD-10-CM | POA: Diagnosis not present

## 2021-09-10 DIAGNOSIS — I517 Cardiomegaly: Secondary | ICD-10-CM | POA: Diagnosis not present

## 2021-09-10 LAB — BASIC METABOLIC PANEL
Anion gap: 8 (ref 5–15)
BUN: 16 mg/dL (ref 8–23)
CO2: 29 mmol/L (ref 22–32)
Calcium: 8.7 mg/dL — ABNORMAL LOW (ref 8.9–10.3)
Chloride: 101 mmol/L (ref 98–111)
Creatinine, Ser: 0.62 mg/dL (ref 0.44–1.00)
GFR, Estimated: >60 mL/min (ref >60)
Glucose, Bld: 95 mg/dL (ref 70–99)
Potassium: 3.8 mmol/L (ref 3.5–5.1)
Sodium: 138 mmol/L (ref 135–145)

## 2021-09-10 LAB — CBC
HCT: 38.4 % (ref 36.0–46.0)
Hemoglobin: 13 g/dL (ref 12.0–15.0)
MCH: 31.6 pg (ref 26.0–34.0)
MCHC: 33.9 g/dL (ref 30.0–36.0)
MCV: 93.4 fL (ref 80.0–100.0)
Platelets: 262 10*3/uL (ref 150–400)
RBC: 4.11 MIL/uL (ref 3.87–5.11)
RDW: 14.2 % (ref 11.5–15.5)
WBC: 8.8 10*3/uL (ref 4.0–10.5)
nRBC: 0 % (ref 0.0–0.2)

## 2021-09-10 LAB — TROPONIN I (HIGH SENSITIVITY): Troponin I (High Sensitivity): 15 ng/L (ref <18)

## 2021-09-10 LAB — BRAIN NATRIURETIC PEPTIDE: B Natriuretic Peptide: 676 pg/mL — ABNORMAL HIGH (ref 0.0–100.0)

## 2021-09-10 IMAGING — US US EXTREM LOW VENOUS
1 series · 14 of 24 positions shown · non-contrast
Comparison: None.

CLINICAL DATA: Pain and swelling

EXAM:
Bilateral LOWER EXTREMITY VENOUS DOPPLER ULTRASOUND
TECHNIQUE: Gray-scale sonography with compression, as well as color and duplex
ultrasound, were performed to evaluate the deep venous system(s)
from the level of the common femoral vein through the popliteal and
proximal calf veins.

[Series 1: us venous img lower bilat (dvt) · portal-venous · 14 of 79 slices shown]
[im 1/79]
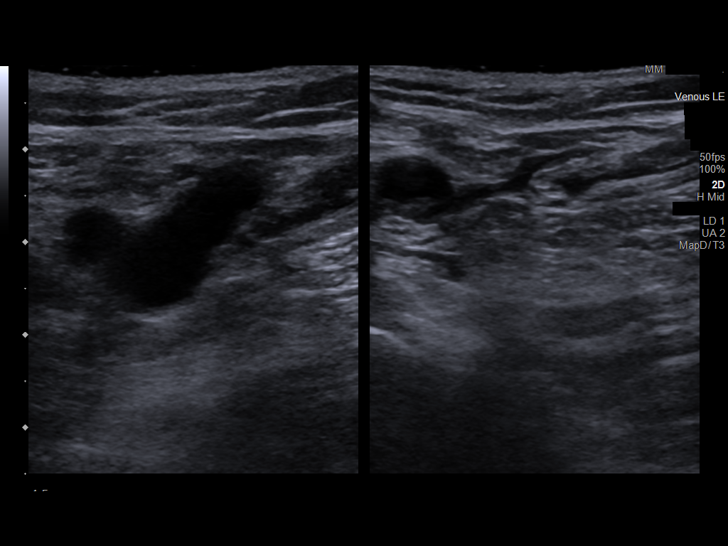
[im 7/79]
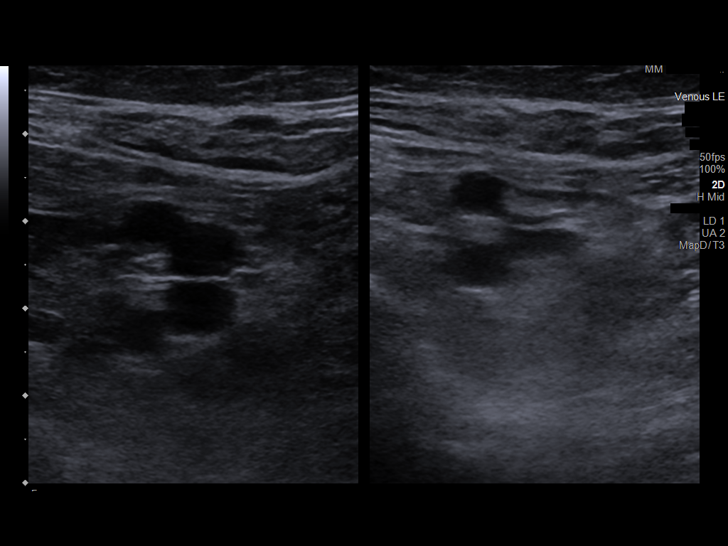
[im 14/79]
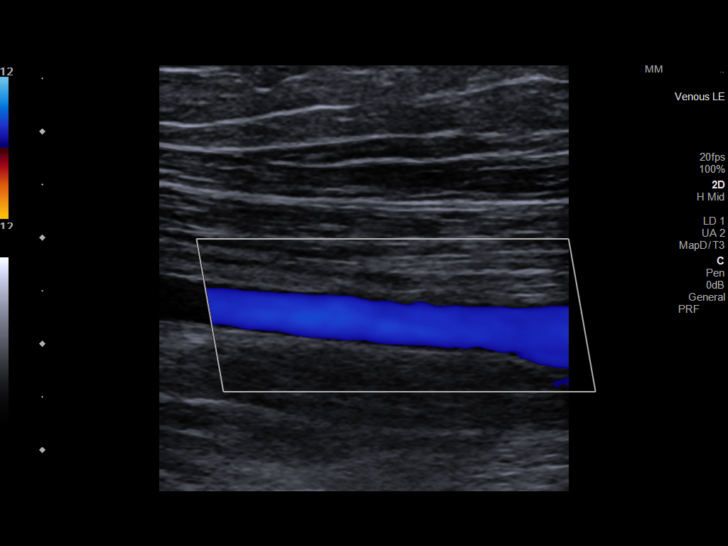
[im 21/79]
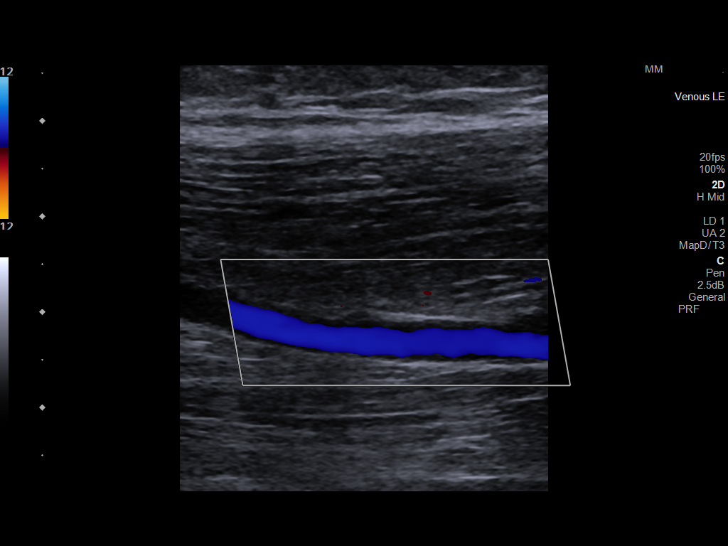
[im 24/79]
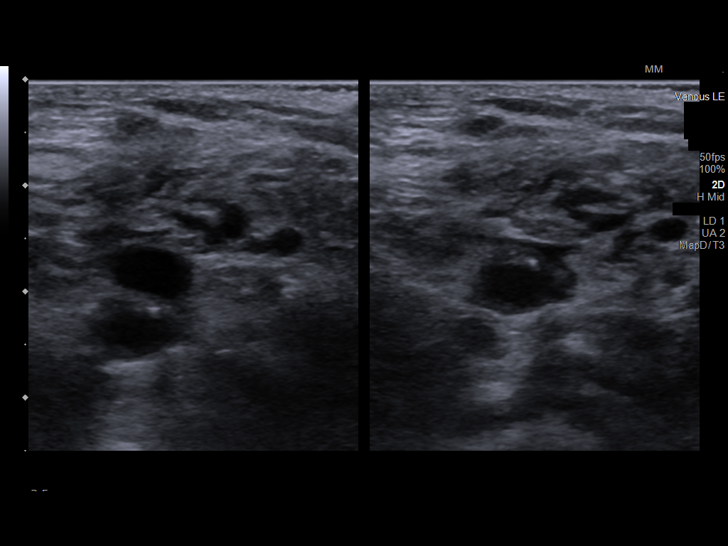
[im 31/79]
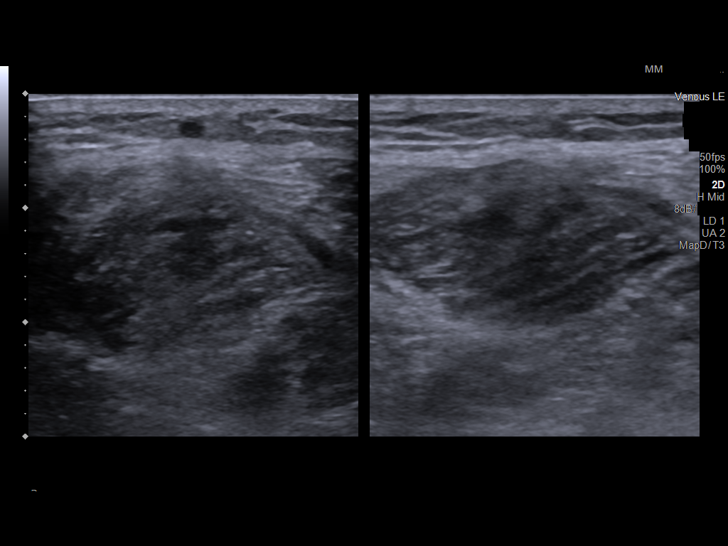
[im 38/79]
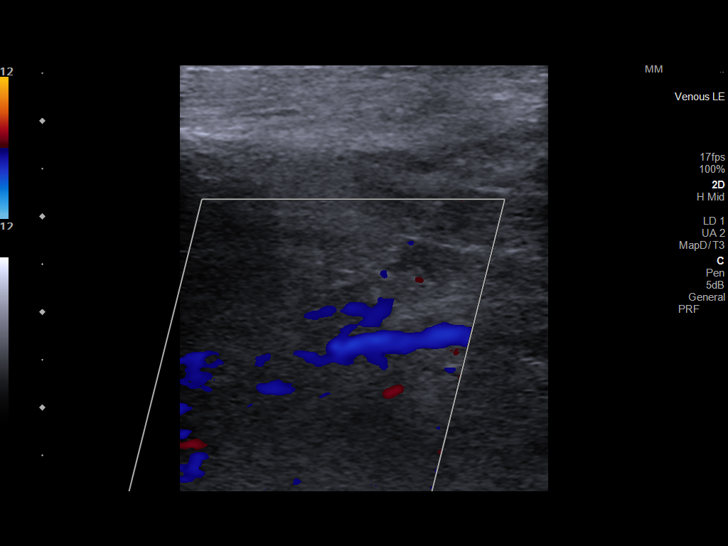
[im 41/79]
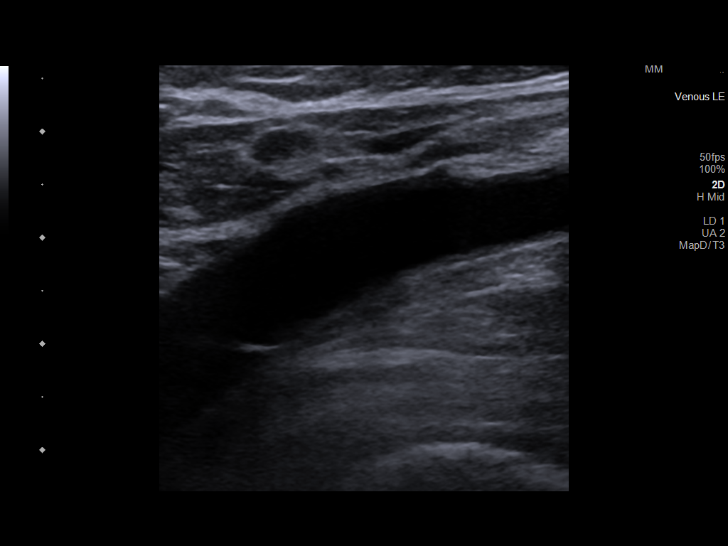
[im 48/79]
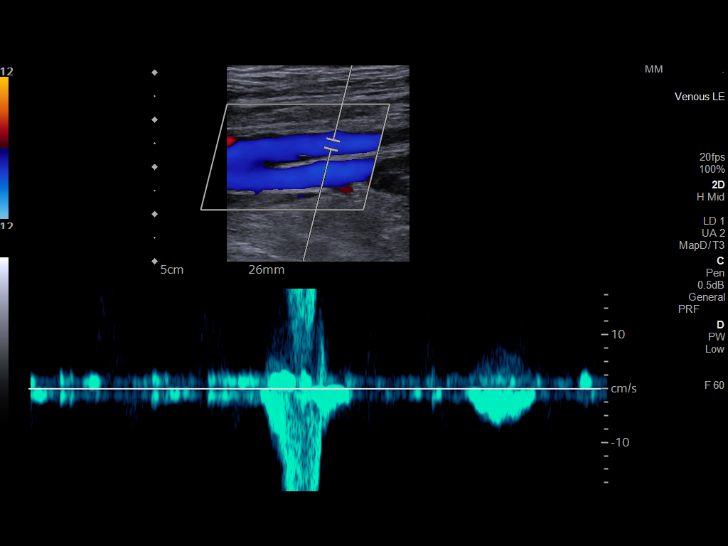
[im 55/79]
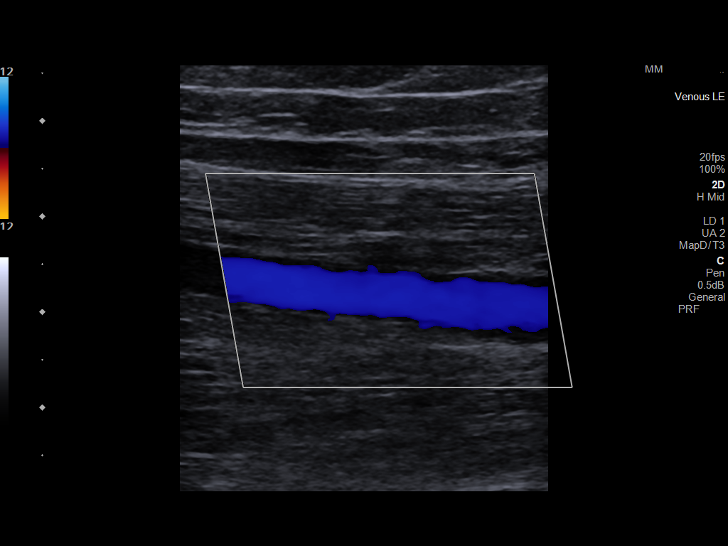
[im 62/79]
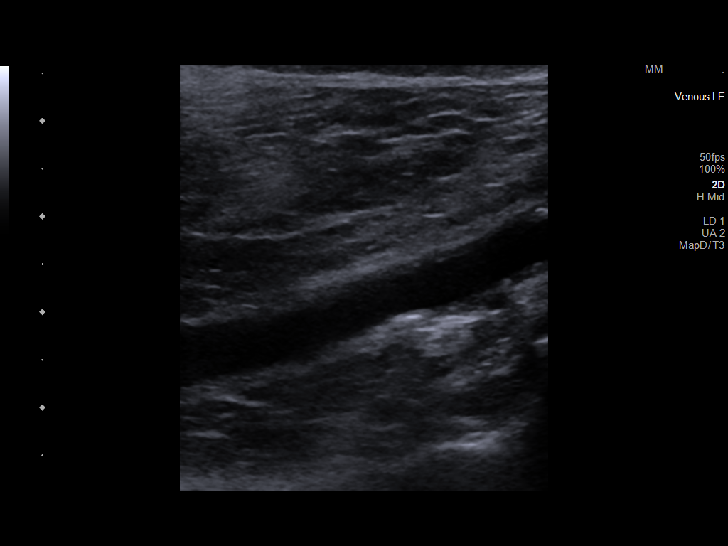
[im 65/79]
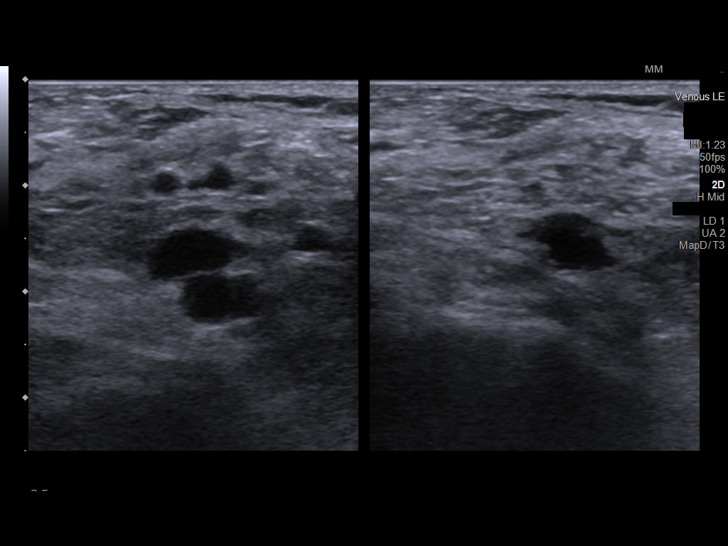
[im 72/79]
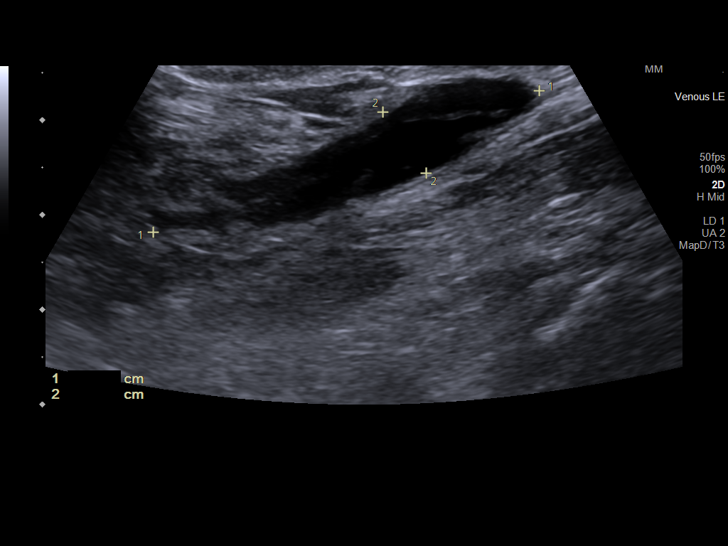
[im 79/79]
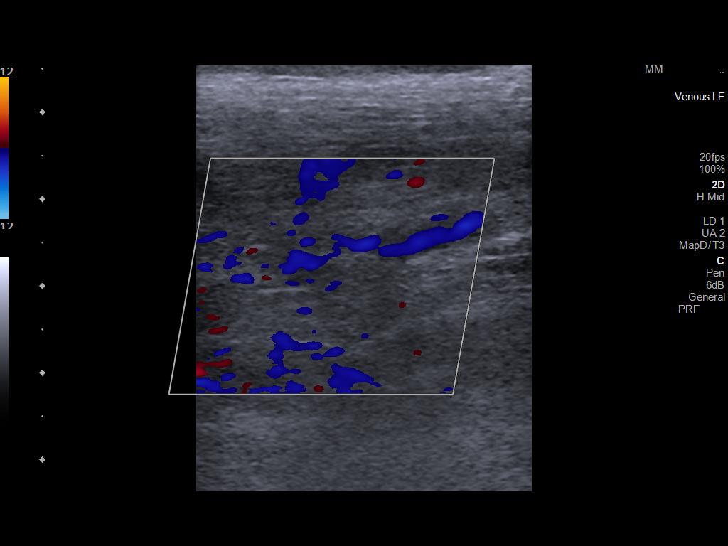

[14 of 24 positions shown; findings below may reference images not displayed]

FINDINGS: VENOUS

Normal compressibility of the common femoral, superficial femoral,
and popliteal veins, as well as the visualized calf veins.
Visualized portions of profunda femoral vein and great saphenous
vein unremarkable. No filling defects to suggest DVT on grayscale or
color Doppler imaging. Doppler waveforms show normal direction of
venous flow, normal respiratory plasticity and response to
augmentation.

Limited views of the contralateral common femoral vein are
unremarkable.

OTHER

There is a 4.3 x 0.8 x 2 cm left popliteal cyst.

Limitations: none
IMPRESSION: There is no evidence of deep venous thrombosis in both lower
extremities.

There is 4.3 x 0.8 x 2 cm left popliteal cyst.

## 2021-09-10 IMAGING — DX DG CHEST 2V
2 series · 2 of 2 positions shown · non-contrast
Comparison: None.

CLINICAL DATA: Shortness of breath

EXAM:
CHEST - 2 VIEW

[chest pa]
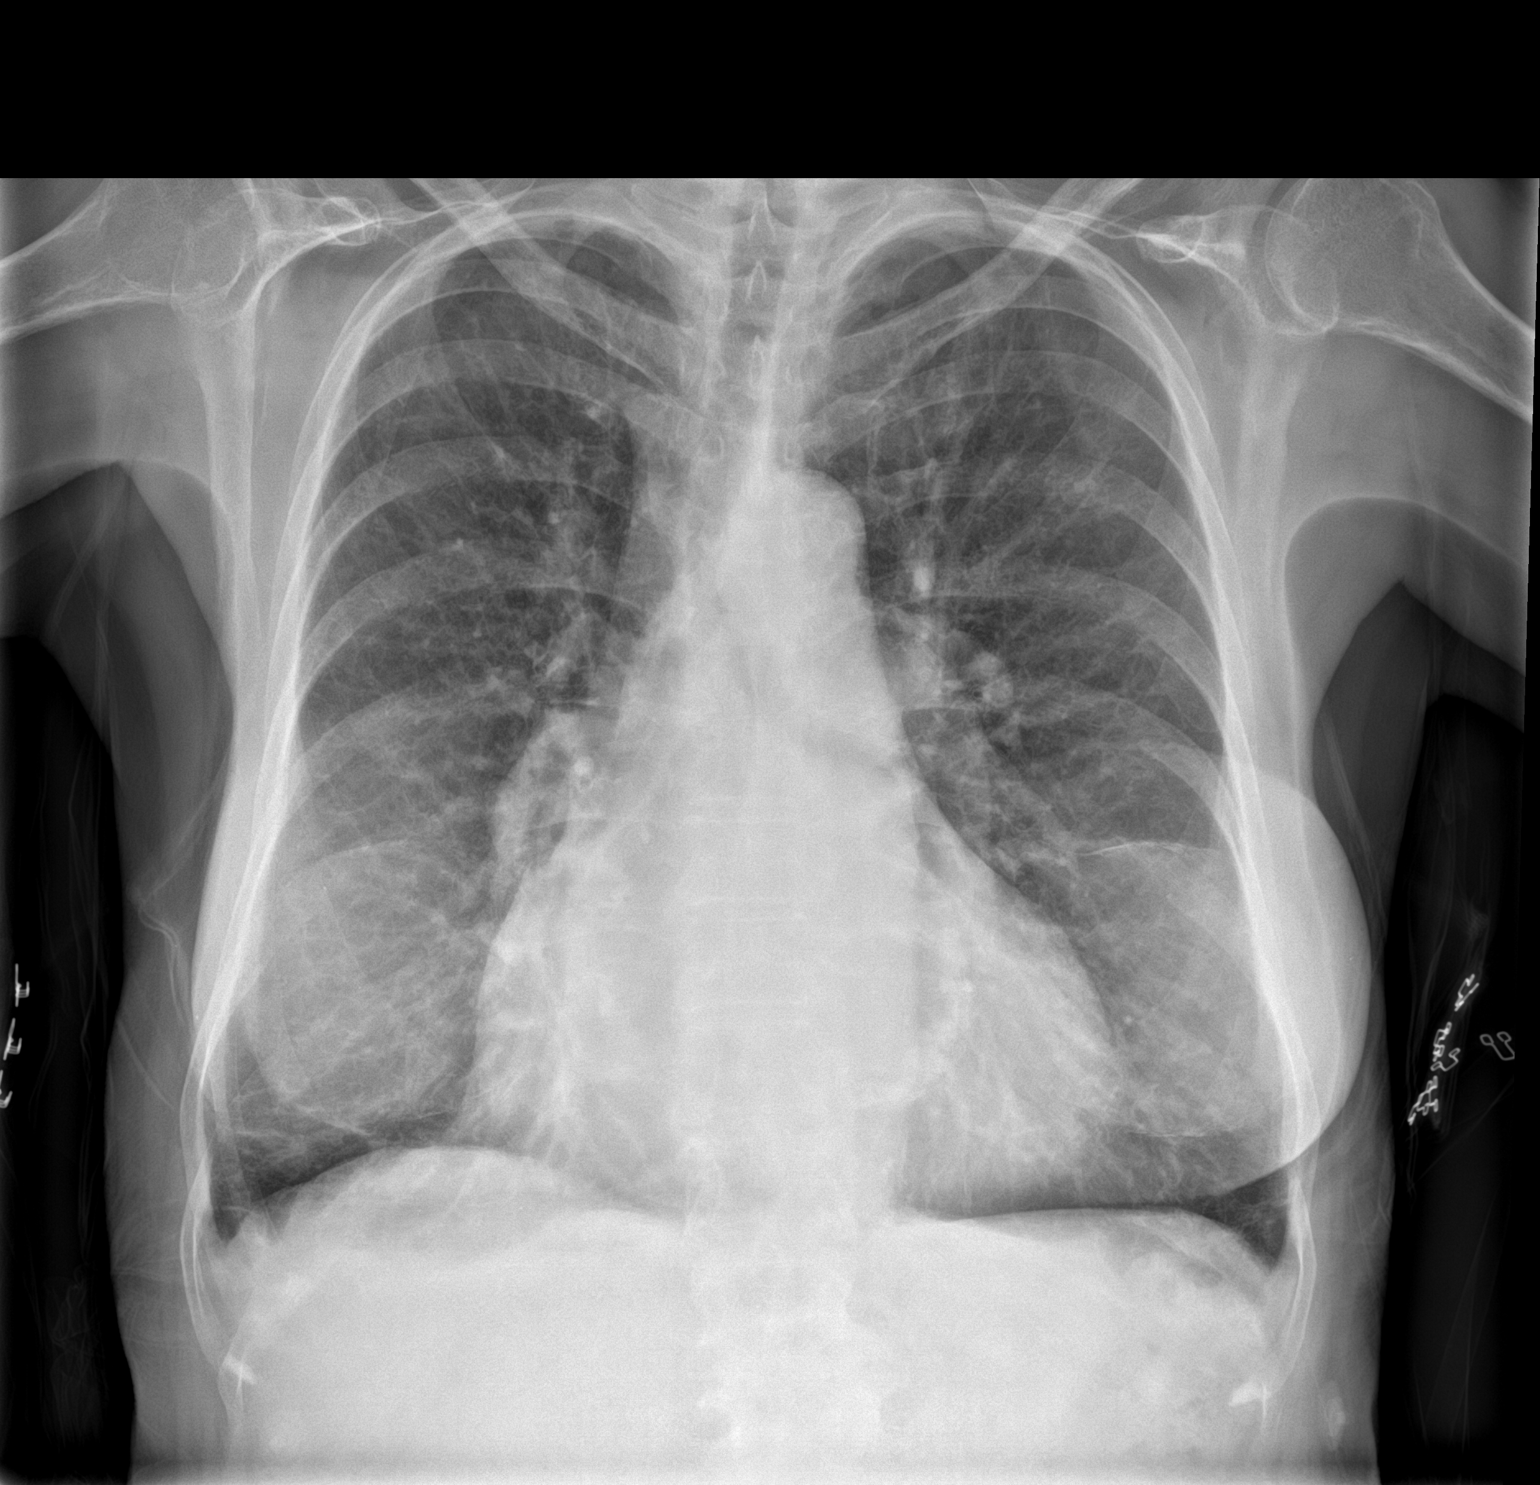

[chest lat]
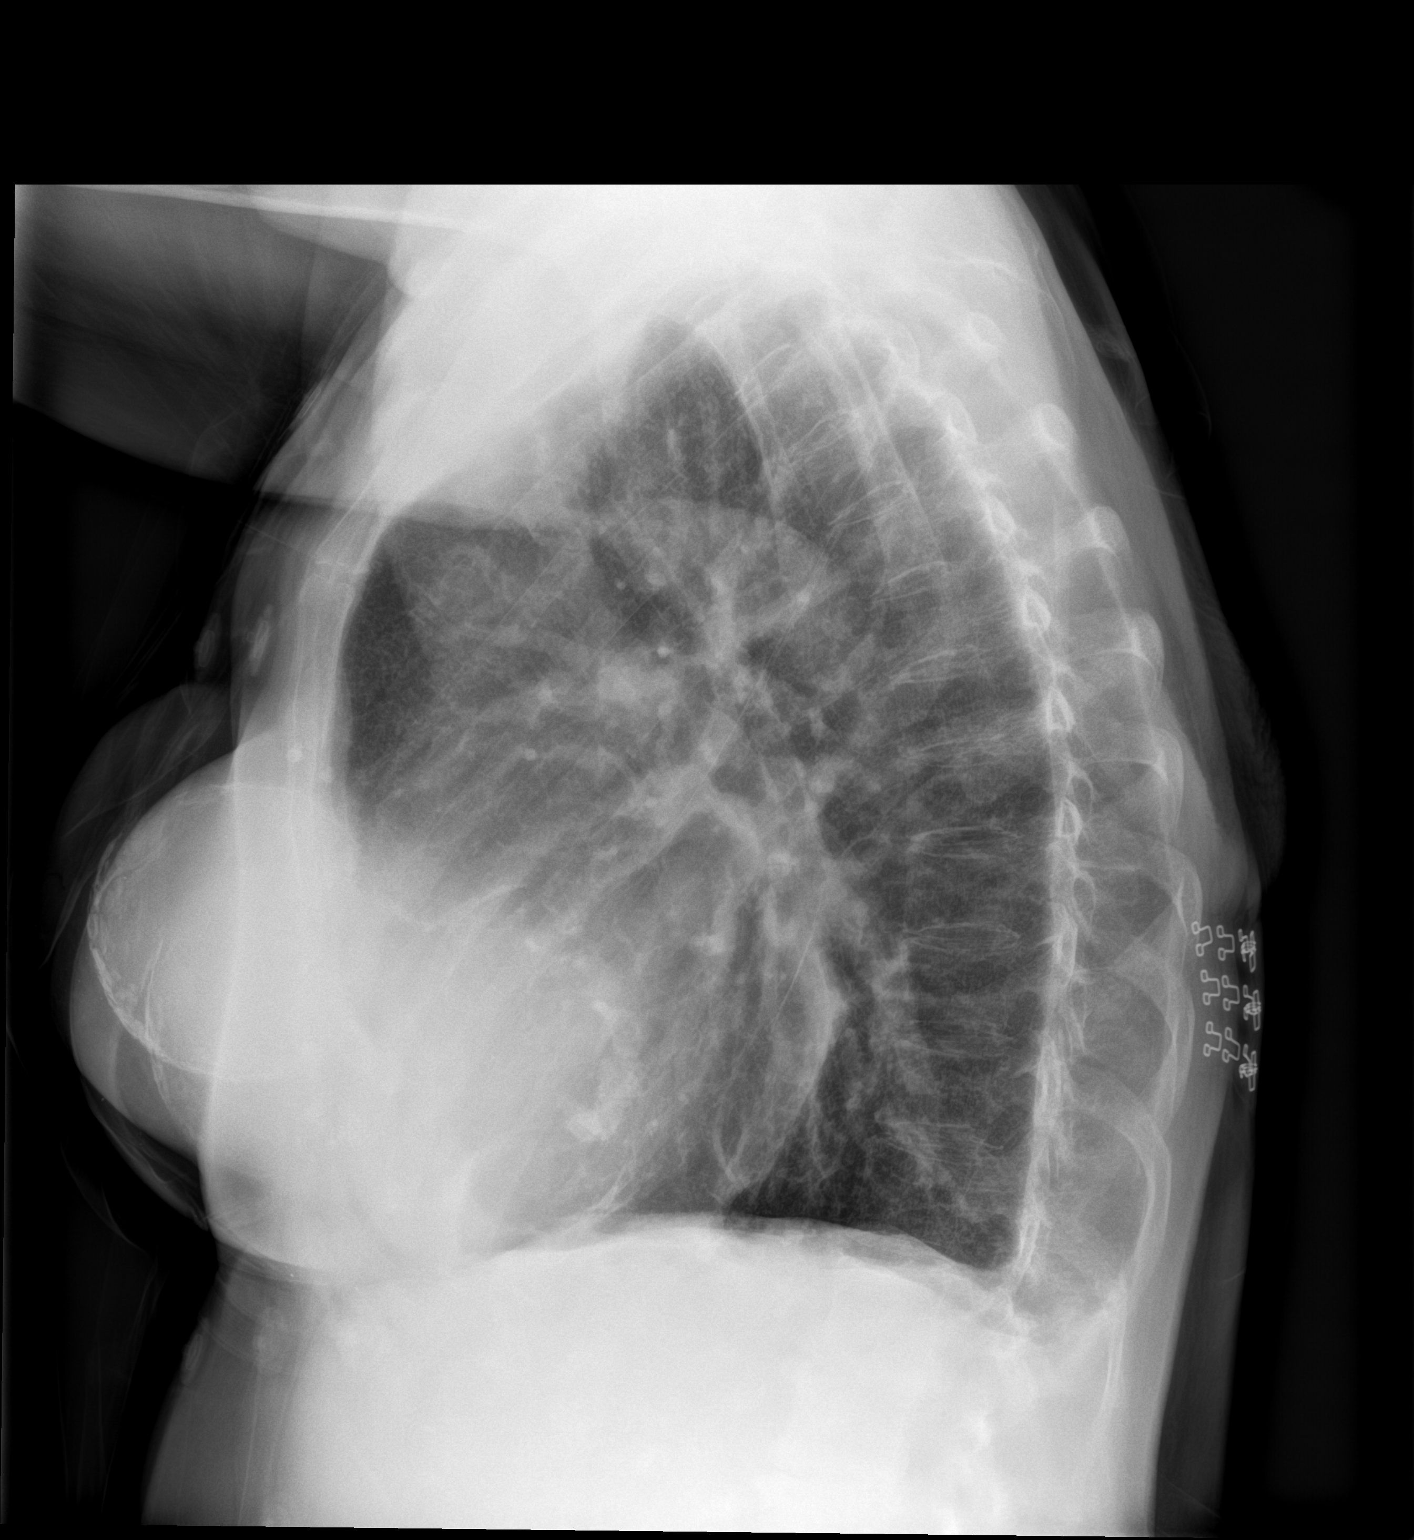

[2 of 2 positions shown; findings below may reference images not displayed]

FINDINGS: Heart size is mildly enlarged. No focal airspace consolidation,
pleural effusion, or pneumothorax. Bilateral breast prostheses with
capsular calcification.
IMPRESSION: Mild cardiomegaly. No active cardiopulmonary disease.

## 2021-09-10 MED ORDER — FUROSEMIDE 20 MG PO TABS: 20.0000 mg | ORAL_TABLET | Freq: Every day | ORAL | 0 refills | Status: DC

## 2021-09-10 MED ORDER — FUROSEMIDE 40 MG PO TABS: 40.0000 mg | ORAL_TABLET | Freq: Every day | ORAL | 0 refills | Status: DC

## 2021-09-10 MED ORDER — FUROSEMIDE 40 MG PO TABS
40.0000 mg | ORAL_TABLET | Freq: Once | ORAL | Status: AC
  Administered 2021-09-10: 40 mg via ORAL
  Filled 2021-09-10: qty 1

## 2021-09-10 NOTE — ED Provider Notes (Signed)
[facility] EMERGENCY DEPARTMENT Provider Note   CSN: [id number] Arrival date & time: 09/10/21  1256     History  Chief Complaint  Patient presents with   Shortness of Breath    Tammie Martin is a 71 y.o. female.   Shortness of Breath  This patient is a 71 year old female, interestingly this patient has a history of atrial fibrillation diagnosed within the last month and has been recently started on Eliquis within the last 2 weeks.  She is also taking a blood pressure medication bisoprolol with hydrochlorothiazide.  She presents to the hospital with a complaint of left leg swelling and some shortness of breath.  The shortness of breath has been present intermittently for a couple of weeks mostly with exertion, the leg swelling was just noted this morning when she woke up and is gradually improved.  She went to her family doctor's office today but they referred her to the emergency department for a more acute work-up given the increasing shortness of breath with leg swelling.  The patient has no history of DVT or thromboembolism, she has not had any recent travel, no trauma, no immobilization, no surgery, she does smoke cigarettes but is trying to stop, she does have a distant history of cervical cancer but this was when she was around 71 years old approximately 40 years ago and has not received cancer treatment in decades.  She is minimally short of breath at this time and has no chest pain, no fevers or chills, no coughing, denies any redness to the leg, denies sleeping any differently last night.  She has been compliant with taking her Eliquis for the last 2 weeks  Home Medications Prior to Admission medications   Medication Sig Start Date End Date Taking? Authorizing Provider  albuterol (VENTOLIN HFA) 108 (90 Base) MCG/ACT inhaler Inhale 2 puffs into the lungs every 6 (six) hours as needed for wheezing or shortness of breath. 04/15/21   Janora Norlander, DO  apixaban (ELIQUIS) 5 MG  TABS tablet Take 1 tablet (5 mg total) by mouth 2 (two) times daily. 08/24/21   Janora Norlander, DO  atorvastatin (LIPITOR) 20 MG tablet Take 1 tablet (20 mg total) by mouth daily. 01/13/21   Janora Norlander, DO  bisoprolol-hydrochlorothiazide (ZIAC) 10-6.25 MG tablet Take 1 tablet by mouth daily. 01/13/21   Janora Norlander, DO  calcium gluconate 500 MG tablet Take 1 tablet by mouth daily.     [provider]  cetirizine (ZYRTEC) 10 MG tablet Take 10 mg by mouth daily.    [provider]  cholecalciferol (VITAMIN D) 1000 units tablet Take 2,000 Units by mouth 2 (two) times daily.    [provider]  desvenlafaxine (PRISTIQ) 50 MG 24 hr tablet Take 1 tablet (50 mg total) by mouth daily. 09/08/21   Janora Norlander, DO  Flaxseed, Linseed, (FLAX SEED OIL) 1000 MG CAPS Take 2,000 mg by mouth daily.    [provider]  fluticasone (FLONASE) 50 MCG/ACT nasal spray Place 2 sprays into both nostrils daily as needed for allergies or rhinitis. 10/28/20   Janora Norlander, DO  furosemide (LASIX) 20 MG tablet Take 1 tablet (20 mg total) by mouth daily for 7 days. 09/10/21 09/17/21  Noemi Chapel, MD  Glucosamine 500 MG CAPS Take 1,000 mg by mouth 2 (two) times daily.     [provider]  levothyroxine (SYNTHROID) 88 MCG tablet Take 1 tablet (88 mcg total) by mouth daily. 10/27/20  Ronnie Doss M, DO  Omega-3 Fatty Acids (FISH OIL) 1000 MG CAPS Take 2 capsules by mouth daily.    [provider]  Probiotic Product (PROBIOTIC ADVANCED PO) Take by mouth daily.    [provider]  tobramycin (TOBREX) 0.3 % ophthalmic solution Place into the right eye. 04/15/20   [provider]  traZODone (DESYREL) 50 MG tablet Take 0.5-1 tablets (25-50 mg total) by mouth at bedtime as needed for sleep. 04/15/21   Janora Norlander, DO  Turmeric 500 MG CAPS Take 500 mg by mouth daily.    [provider]  umeclidinium-vilanterol (ANORO  ELLIPTA) 62.5-25 MCG/ACT AEPB Inhale 1 puff into the lungs daily at 6 (six) AM. 06/30/21   Janora Norlander, DO      Allergies    Penicillins, Singulair [montelukast sodium], and Codeine    Review of Systems   Review of Systems  Respiratory:  Positive for shortness of breath.   All other systems reviewed and are negative.  Physical Exam Updated Vital Signs BP (!) 157/109 (BP Location: Left Arm)    Pulse 90    Temp 97.8 F (36.6 C) (Oral)    Resp (!) 22    Ht 1.6 m (5\' 3" )    Wt 54.4 kg    SpO2 95%    BMI 21.26 kg/m  Physical Exam Vitals and nursing note reviewed.  Constitutional:      General: She is not in acute distress.    Appearance: She is well-developed.  HENT:     Head: Normocephalic and atraumatic.     Mouth/Throat:     Pharynx: No oropharyngeal exudate.  Eyes:     General: No scleral icterus.       Right eye: No discharge.        Left eye: No discharge.     Conjunctiva/sclera: Conjunctivae normal.     Pupils: Pupils are equal, round, and reactive to light.  Neck:     Thyroid: No thyromegaly.     Vascular: No JVD.  Cardiovascular:     Rate and Rhythm: Normal rate and regular rhythm.     Heart sounds: Normal heart sounds. No murmur heard.   No friction rub. No gallop.  Pulmonary:     Effort: Pulmonary effort is normal. No respiratory distress.     Breath sounds: Normal breath sounds. No wheezing or rales.  Abdominal:     General: Bowel sounds are normal. There is no distension.     Palpations: Abdomen is soft. There is no mass.     Tenderness: There is no abdominal tenderness.  Musculoskeletal:        General: No tenderness. Normal range of motion.     Cervical back: Normal range of motion and neck supple.     Left lower leg: Edema present.     Comments: There is a very subtle asymmetry of the legs with the left lower extremity slightly more swollen than the right around the ankle.  Lymphadenopathy:     Cervical: No cervical adenopathy.  Skin:    General:  Skin is warm and dry.     Findings: No erythema or rash.  Neurological:     Mental Status: She is alert.     Coordination: Coordination normal.  Psychiatric:        Behavior: Behavior normal.    ED Results / Procedures / Treatments   Labs (all labs ordered are listed, but only abnormal results are displayed) Labs Reviewed  BASIC  METABOLIC PANEL - Abnormal; Notable for the following components:      Result Value   Calcium 8.7 (*)    All other components within normal limits  BRAIN NATRIURETIC PEPTIDE - Abnormal; Notable for the following components:   B Natriuretic Peptide 676.0 (*)    All other components within normal limits  CBC  TROPONIN I (HIGH SENSITIVITY)    EKG EKG Interpretation  Date/Time:  Friday September 10 2021 13:20:55 EST Ventricular Rate:  94 PR Interval:    QRS Duration: 72 QT Interval:  372 QTC Calculation: 465 R Axis:   78 Text Interpretation: Atrial fibrillation with premature ventricular or aberrantly conducted complexes Marked ST abnormality, possible inferior subendocardial injury Abnormal ECG No previous ECGs available Confirmed by Noemi Chapel [phone]) on 09/10/2021 3:58:30 PM  Radiology DG Chest 2 View  Result Date: 09/10/2021 CLINICAL DATA:  Shortness of breath EXAM: CHEST - 2 VIEW COMPARISON:  None. FINDINGS: Heart size is mildly enlarged. No focal airspace consolidation, pleural effusion, or pneumothorax. Bilateral breast prostheses with capsular calcification. IMPRESSION: Mild cardiomegaly. No active cardiopulmonary disease. Electronically Signed   By: Davina Poke D.O.   On: 09/10/2021 13:37   US Venous Img Lower Bilateral  Result Date: 09/10/2021 CLINICAL DATA:  Pain and swelling EXAM: Bilateral LOWER EXTREMITY VENOUS DOPPLER ULTRASOUND TECHNIQUE: Gray-scale sonography with compression, as well as color and duplex ultrasound, were performed to evaluate the deep venous system(s) from the level of the common femoral vein through the popliteal  and proximal calf veins. COMPARISON:  None. FINDINGS: VENOUS Normal compressibility of the common femoral, superficial femoral, and popliteal veins, as well as the visualized calf veins. Visualized portions of profunda femoral vein and great saphenous vein unremarkable. No filling defects to suggest DVT on grayscale or color Doppler imaging. Doppler waveforms show normal direction of venous flow, normal respiratory plasticity and response to augmentation. Limited views of the contralateral common femoral vein are unremarkable. OTHER There is a 4.3 x 0.8 x 2 cm left popliteal cyst. Limitations: none IMPRESSION: There is no evidence of deep venous thrombosis in both lower extremities. There is 4.3 x 0.8 x 2 cm left popliteal cyst. Electronically Signed   By: Elmer Picker M.D.   On: 09/10/2021 17:39    Procedures Procedures    Medications Ordered in ED Medications  furosemide (LASIX) tablet 40 mg (40 mg Oral Given 09/10/21 1906)    ED Course/ Medical Decision Making/ A&P                           Medical Decision Making  This patient presents to the ED for concern of swelling of the leg with shortness of breath, this involves an extensive number of treatment options, and is a complaint that carries with it a high risk of complications and morbidity.  The differential diagnosis includes DVT, pulmonary embolism, the patient does have cardiomegaly on the chest x-ray so would consider congestive heart failure   Co morbidities that complicate the patient evaluation  Chronic tobacco use, atrial fibrillation   Additional history obtained:  Additional history obtained from electronic medical record External records from outside source obtained and reviewed including office visit, history of generalized anxiety disorder most recently seen December 27, history of depression, history of insomnia, history of hypothyroidism and history of hypertension.   Lab Tests:  I Ordered, and personally  interpreted labs.  The pertinent results include:  BNP > 600, c/w CHF,  Imaging Studies ordered:  I ordered imaging studies including DVT study [place]   I independently visualized and interpreted imaging which showed negative I agree with the radiologist interpretation   Cardiac Monitoring:  The patient was maintained on a cardiac monitor.  I personally viewed and interpreted the cardiac monitored which showed an underlying rhythm of: afib (known)   Medicines ordered and prescription drug management:  I ordered medication including lasix  for edema / CHF  Reevaluation of the patient after these medicines showed that the patient improved I have reviewed the patients home medicines and have made adjustments as needed   Test Considered:  CT scan - but less likely to have PE given on eliquis   Critical Interventions:  Lasix for edema and CHF   Problem List / ED Course:  CHF - treated with lasix   Reevaluation:  After the interventions noted above, I reevaluated the patient and found that they have :improved   Dispostion:  After consideration of the diagnostic results and the patients response to treatment, I feel that the patent would benefit from outpatient f/u and cardiology.          Final Clinical Impression(s) / ED Diagnoses Final diagnoses:  Acute congestive heart failure, unspecified heart failure type [facility])    Rx / DC Orders ED Discharge Orders          Ordered    furosemide (LASIX) 40 MG tablet  Daily,   Status:  Discontinued        09/10/21 1905    furosemide (LASIX) 20 MG tablet  Daily        09/10/21 1906              Noemi Chapel, MD 09/10/21 1911

## 2021-09-10 NOTE — ED Triage Notes (Signed)
Shortness of breath, states her left leg is swelling ,history of a fib

## 2021-09-10 NOTE — Discharge Instructions (Addendum)
Your testing showed that you have a likely weakened heart, this may be related to your atrial fibrillation or may be related to other causes.  I want you to take your medications exactly as prescribed by your doctor including your blood pressure medications, your Eliquis and make sure that you take the furosemide or the fluid pill which I have given you 1 tablet a day for the next 7 days.  If you are taking furosemide please make sure it is only 20 mg/day, the 40 mg tablet may be dispensed but only take 20 mg a day.  He will need to see the heart doctor within the next 3 to 4 days, I have given you the phone number for their office, please call to make an appointment.  But I want you to return to the emergency department for severe worsening symptoms.

## 2021-09-13 ENCOUNTER — Telehealth: Payer: Self-pay | Admitting: Family Medicine

## 2021-09-13 NOTE — Telephone Encounter (Signed)
Appointment not available with Dr. Lajuana Ripple until 09/22/21.  Appointment scheduled with Monia Pouch on 09/16/2021.

## 2021-09-16 ENCOUNTER — Ambulatory Visit (INDEPENDENT_AMBULATORY_CARE_PROVIDER_SITE_OTHER): Payer: Medicare HMO | Admitting: Family Medicine

## 2021-09-16 ENCOUNTER — Encounter: Payer: Self-pay | Admitting: Family Medicine

## 2021-09-16 ENCOUNTER — Telehealth: Payer: Self-pay | Admitting: Family Medicine

## 2021-09-16 VITALS — BP 109/75 | HR 58 | Temp 98.0°F | Ht 63.0 in | Wt 118.0 lb

## 2021-09-16 DIAGNOSIS — I4891 Unspecified atrial fibrillation: Secondary | ICD-10-CM | POA: Diagnosis not present

## 2021-09-16 DIAGNOSIS — I509 Heart failure, unspecified: Secondary | ICD-10-CM

## 2021-09-16 DIAGNOSIS — R011 Cardiac murmur, unspecified: Secondary | ICD-10-CM

## 2021-09-16 MED ORDER — FUROSEMIDE 20 MG PO TABS: 20.0000 mg | ORAL_TABLET | Freq: Every day | ORAL | 3 refills | Status: DC

## 2021-09-16 NOTE — Addendum Note (Signed)
Addended by: Baruch Gouty on: 09/16/2021 02:11 PM   Modules accepted: Orders

## 2021-09-16 NOTE — Progress Notes (Signed)
Subjective:  Patient ID: Tammie Martin, female    DOB: 1950/10/26, 71 y.o.   MRN: [id number]  Patient Care Team: Janora Norlander, DO as PCP - General (Family Medicine)   Chief Complaint:  Follow-up (Weakness, tired, shortness of breath, left leg edema (resolved))   HPI: Tammie Martin is a 71 y.o. female presenting on 09/16/2021 for Follow-up (Weakness, tired, shortness of breath, left leg edema (resolved))  Pt presents today for follow up after recent ED visit for new onset A-Fib and HF. BNP was 676. CXR revealed cardiomegaly but not acute pulmonary edema. She was treated with lasix in the ED and sent home with Lasix 20 mg daily. She has follow up scheduled with Dr. [name] 09/27/2021. She has improved greatly since ED visit. She denies leg swelling, palpitations, orthopnea, PND, chest pain, or syncope. She does report fatigue and slight dyspnea on exertion. Prior EKG with LVH changes.     Relevant past medical, surgical, family, and social history reviewed and updated as indicated.  Allergies and medications reviewed and updated. Data reviewed: Chart in Epic.   Past Medical History:  Diagnosis Date   Depression    DJD (degenerative joint disease)    Hyperlipidemia    Hypertension    Thyroid disease     Past Surgical History:  Procedure Laterality Date   ABDOMINAL HYSTERECTOMY     APPENDECTOMY     BREAST ENHANCEMENT SURGERY     TONSILLECTOMY      Social History   Socioeconomic History   Marital status: Widowed    Spouse name: Not on file   Number of children: 2   Years of education: 14   Highest education level: Associate degree: academic program  Occupational History   Occupation: Retired    Comment: Equities trader  Tobacco Use   Smoking status: Every Day    Packs/day: 1.00    Years: 40.00    Pack years: 40.00    Types: Cigarettes   Smokeless tobacco: Never  Vaping Use   Vaping Use: Never used  Substance and Sexual Activity   Alcohol use: No    Drug use: No   Sexual activity: Not Currently  Other Topics Concern   Not on file  Social History Narrative   Lives alone - one cat, one dog   Social Determinants of Health   Financial Resource Strain: Low Risk    Difficulty of Paying Living Expenses: Not hard at all  Food Insecurity: No Food Insecurity   Worried About Charity fundraiser in the Last Year: Never true   [place] in the Last Year: Never true  Transportation Needs: No Transportation Needs   Lack of Transportation (Medical): No   Lack of Transportation (Non-Medical): No  Physical Activity: Inactive   Days of Exercise per Week: 0 days   Minutes of Exercise per Session: 0 min  Stress: No Stress Concern Present   Feeling of Stress : Not at all  Social Connections: Moderately Isolated   Frequency of Communication with Friends and Family: More than three times a week   Frequency of Social Gatherings with Friends and Family: More than three times a week   Attends Religious Services: Never   Marine scientist or Organizations: Yes   Attends Music therapist: More than 4 times per year   Marital Status: Widowed  Intimate Partner Violence: Not At Risk   Fear of Current or Ex-Partner: No   Emotionally  Abused: No   Physically Abused: No   Sexually Abused: No    Outpatient Encounter Medications as of 09/16/2021  Medication Sig   albuterol (VENTOLIN HFA) 108 (90 Base) MCG/ACT inhaler Inhale 2 puffs into the lungs every 6 (six) hours as needed for wheezing or shortness of breath.   apixaban (ELIQUIS) 5 MG TABS tablet Take 1 tablet (5 mg total) by mouth 2 (two) times daily.   atorvastatin (LIPITOR) 20 MG tablet Take 1 tablet (20 mg total) by mouth daily.   bisoprolol-hydrochlorothiazide (ZIAC) 10-6.25 MG tablet Take 1 tablet by mouth daily.   calcium gluconate 500 MG tablet Take 1 tablet by mouth daily.    cetirizine (ZYRTEC) 10 MG tablet Take 10 mg by mouth daily.   cholecalciferol (VITAMIN D)  1000 units tablet Take 2,000 Units by mouth 2 (two) times daily.   desvenlafaxine (PRISTIQ) 50 MG 24 hr tablet Take 1 tablet (50 mg total) by mouth daily.   Flaxseed, Linseed, (FLAX SEED OIL) 1000 MG CAPS Take 2,000 mg by mouth daily.   fluticasone (FLONASE) 50 MCG/ACT nasal spray Place 2 sprays into both nostrils daily as needed for allergies or rhinitis.   furosemide (LASIX) 20 MG tablet Take 1 tablet (20 mg total) by mouth daily for 7 days.   Glucosamine 500 MG CAPS Take 1,000 mg by mouth 2 (two) times daily.    levothyroxine (SYNTHROID) 88 MCG tablet Take 1 tablet (88 mcg total) by mouth daily.   Omega-3 Fatty Acids (FISH OIL) 1000 MG CAPS Take 2 capsules by mouth daily.   Probiotic Product (PROBIOTIC ADVANCED PO) Take by mouth daily.   tobramycin (TOBREX) 0.3 % ophthalmic solution Place into the right eye.   traZODone (DESYREL) 50 MG tablet Take 0.5-1 tablets (25-50 mg total) by mouth at bedtime as needed for sleep.   Turmeric 500 MG CAPS Take 500 mg by mouth daily.   umeclidinium-vilanterol (ANORO ELLIPTA) 62.5-25 MCG/ACT AEPB Inhale 1 puff into the lungs daily at 6 (six) AM.   No facility-administered encounter medications on file as of 09/16/2021.    Allergies  Allergen Reactions   Penicillins Hives   Singulair [Montelukast Sodium]     headache   Codeine Rash    Review of Systems  Constitutional:  Positive for activity change and fatigue. Negative for appetite change, chills, diaphoresis, fever and unexpected weight change.  HENT: Negative.    Eyes: Negative.   Respiratory:  Positive for shortness of breath. Negative for apnea, cough, choking, chest tightness, wheezing and stridor.   Cardiovascular:  Negative for chest pain, palpitations and leg swelling.  Gastrointestinal:  Negative for abdominal distention, abdominal pain, blood in stool, constipation, diarrhea, nausea and vomiting.  Endocrine: Negative.   Genitourinary:  Negative for decreased urine volume, difficulty  urinating, dysuria, frequency and urgency.  Musculoskeletal:  Negative for arthralgias and myalgias.  Skin: Negative.   Allergic/Immunologic: Negative.   Neurological:  Negative for dizziness, tremors, seizures, syncope, facial asymmetry, speech difficulty, light-headedness, numbness and headaches.  Hematological: Negative.   Psychiatric/Behavioral:  Negative for confusion, hallucinations, sleep disturbance and suicidal ideas.   All other systems reviewed and are negative.      Objective:  BP 109/75    Pulse (!) 58    Temp 98 F (36.7 C)    Ht '5\' 3"'  (1.6 m)    Wt 118 lb (53.5 kg)    SpO2 96%    BMI 20.90 kg/m    Wt Readings from Last 3 Encounters:  09/16/21  118 lb (53.5 kg)  09/10/21 120 lb (54.4 kg)  08/24/21 119 lb 6.4 oz (54.2 kg)    Physical Exam Vitals and nursing note reviewed.  Constitutional:      General: She is not in acute distress.    Appearance: Normal appearance. She is well-developed, well-groomed and normal weight. She is not ill-appearing, toxic-appearing or diaphoretic.  HENT:     Head: Normocephalic and atraumatic.     Jaw: There is normal jaw occlusion.     Right Ear: Hearing normal.     Left Ear: Hearing normal.     Nose: Nose normal.     Mouth/Throat:     Lips: Pink.     Mouth: Mucous membranes are moist.     Pharynx: Oropharynx is clear. Uvula midline.  Eyes:     General: Lids are normal.     Extraocular Movements: Extraocular movements intact.     Conjunctiva/sclera: Conjunctivae normal.     Pupils: Pupils are equal, round, and reactive to light.  Neck:     Thyroid: No thyroid mass, thyromegaly or thyroid tenderness.     Vascular: No carotid bruit or JVD.     Trachea: Trachea and phonation normal.  Cardiovascular:     Rate and Rhythm: Normal rate and regular rhythm.     Chest Wall: PMI is not displaced.     Pulses: Normal pulses.     Heart sounds: Murmur heard.  Systolic (loudest at apex) murmur is present with a grade of 2/6.    No friction  rub. No gallop.  Pulmonary:     Effort: Pulmonary effort is normal. No respiratory distress.     Breath sounds: Normal breath sounds. No wheezing, rhonchi or rales.  Abdominal:     General: Bowel sounds are normal. There is no distension or abdominal bruit.     Palpations: Abdomen is soft. There is no hepatomegaly or splenomegaly.     Tenderness: There is no abdominal tenderness. There is no right CVA tenderness or left CVA tenderness.     Hernia: No hernia is present.  Musculoskeletal:        General: Normal range of motion.     Cervical back: Normal range of motion and neck supple.     Right lower leg: No edema.     Left lower leg: No edema.  Lymphadenopathy:     Cervical: No cervical adenopathy.  Skin:    General: Skin is warm and dry.     Capillary Refill: Capillary refill takes less than 2 seconds.     Coloration: Skin is not cyanotic, jaundiced or pale.     Findings: No rash.  Neurological:     General: No focal deficit present.     Mental Status: She is alert and oriented to person, place, and time.     Sensory: Sensation is intact.     Motor: Motor function is intact.     Coordination: Coordination is intact.     Gait: Gait is intact.     Deep Tendon Reflexes: Reflexes are normal and symmetric.  Psychiatric:        Attention and Perception: Attention and perception normal.        Mood and Affect: Mood and affect normal.        Speech: Speech normal.        Behavior: Behavior normal. Behavior is cooperative.        Thought Content: Thought content normal.        Cognition  and Memory: Cognition and memory normal.        Judgment: Judgment normal.    Results for orders placed or performed during the hospital encounter of 07/68/08  Basic metabolic panel  Result Value Ref Range   Sodium 138 135 - 145 mmol/L   Potassium 3.8 3.5 - 5.1 mmol/L   Chloride 101 98 - 111 mmol/L   CO2 29 22 - 32 mmol/L   Glucose, Bld 95 70 - 99 mg/dL   BUN 16 8 - 23 mg/dL   Creatinine, Ser  0.62 0.44 - 1.00 mg/dL   Calcium 8.7 (L) 8.9 - 10.3 mg/dL   GFR, Estimated >60 >60 mL/min   Anion gap 8 5 - 15  CBC  Result Value Ref Range   WBC 8.8 4.0 - 10.5 K/uL   RBC 4.11 3.87 - 5.11 MIL/uL   Hemoglobin 13.0 12.0 - 15.0 g/dL   HCT 38.4 36.0 - 46.0 %   MCV 93.4 80.0 - 100.0 fL   MCH 31.6 26.0 - 34.0 pg   MCHC 33.9 30.0 - 36.0 g/dL   RDW 14.2 11.5 - 15.5 %   Platelets 262 150 - 400 K/uL   nRBC 0.0 0.0 - 0.2 %  Brain natriuretic peptide  Result Value Ref Range   B Natriuretic Peptide 676.0 (H) 0.0 - 100.0 pg/mL  Troponin I (High Sensitivity)  Result Value Ref Range   Troponin I (High Sensitivity) 15 <18 ng/L       Pertinent labs & imaging results that were available during my care of the patient were reviewed by me and considered in my medical decision making.  Assessment & Plan:  Juleen was seen today for follow-up.  Diagnoses and all orders for this visit:  New onset of congestive heart failure ([place]) New onset a-fib ([place]) Heart murmur Doing well on lasix 20 mg daily and Eliquis. Rate well controlled. Will recheck BNP and BMP today. HF and A-Fib diagnosis and potential underlying causes discussed in detail. Pt aware of red flags which require emergent evaluation. Follow up cardiology as scheduled. Follow up with PCP in 1-2 months as discussed.  -     BMP8+EGFR -     Brain natriuretic peptide     Continue all other maintenance medications.  Follow up plan: Return if symptoms worsen or fail to improve.   Continue healthy lifestyle choices, including diet (rich in fruits, vegetables, and lean proteins, and low in salt and simple carbohydrates) and exercise (at least 30 minutes of moderate physical activity daily).  Educational handout given for heart failure  The above assessment and management plan was discussed with the patient. The patient verbalized understanding of and has agreed to the management plan. Patient is aware to call the clinic if they develop any  new symptoms or if symptoms persist or worsen. Patient is aware when to return to the clinic for a follow-up visit. Patient educated on when it is appropriate to go to the emergency department.   Monia Pouch, FNP-C [facility] [phone]

## 2021-09-16 NOTE — Progress Notes (Deleted)
Subjective:  Patient ID: Tammie Martin, female    DOB: 10/24/1950, 71 y.o.   MRN: [id number]  Patient Care Team: Janora Norlander, DO as PCP - General (Family Medicine)   Chief Complaint:  Follow-up (Weakness, tired, shortness of breath, left leg edema (resolved))   HPI: Tammie Martin is a 71 y.o. female presenting on 09/16/2021 for Follow-up (Weakness, tired, shortness of breath, left leg edema (resolved))   Pt is here for follow up of newly diagnosed heart failure and AFIB.   Congestive Heart Failure Presents for initial visit. Treatments tried: Lasix. Her past medical history is significant for HTN. She has one 1st degree relative with heart disease.  1. New onset of congestive heart failure (HCC)      Relevant past medical, surgical, family, and social history reviewed and updated as indicated.  Allergies and medications reviewed and updated. Data reviewed: Chart in Epic.   Past Medical History:  Diagnosis Date   Depression    DJD (degenerative joint disease)    Hyperlipidemia    Hypertension    Thyroid disease     Past Surgical History:  Procedure Laterality Date   ABDOMINAL HYSTERECTOMY     APPENDECTOMY     BREAST ENHANCEMENT SURGERY     TONSILLECTOMY      Social History   Socioeconomic History   Marital status: Widowed    Spouse name: Not on file   Number of children: 2   Years of education: 14   Highest education level: Associate degree: academic program  Occupational History   Occupation: Retired    Comment: Equities trader  Tobacco Use   Smoking status: Every Day    Packs/day: 1.00    Years: 40.00    Pack years: 40.00    Types: Cigarettes   Smokeless tobacco: Never  Vaping Use   Vaping Use: Never used  Substance and Sexual Activity   Alcohol use: No   Drug use: No   Sexual activity: Not Currently  Other Topics Concern   Not on file  Social History Narrative   Lives alone - one cat, one dog   Social Determinants of  Health   Financial Resource Strain: Low Risk    Difficulty of Paying Living Expenses: Not hard at all  Food Insecurity: No Food Insecurity   Worried About Charity fundraiser in the Last Year: Never true   [place] in the Last Year: Never true  Transportation Needs: No Transportation Needs   Lack of Transportation (Medical): No   Lack of Transportation (Non-Medical): No  Physical Activity: Inactive   Days of Exercise per Week: 0 days   Minutes of Exercise per Session: 0 min  Stress: No Stress Concern Present   Feeling of Stress : Not at all  Social Connections: Moderately Isolated   Frequency of Communication with Friends and Family: More than three times a week   Frequency of Social Gatherings with Friends and Family: More than three times a week   Attends Religious Services: Never   Marine scientist or Organizations: Yes   Attends Music therapist: More than 4 times per year   Marital Status: Widowed  Intimate Partner Violence: Not At Risk   Fear of Current or Ex-Partner: No   Emotionally Abused: No   Physically Abused: No   Sexually Abused: No    Outpatient Encounter Medications as of 09/16/2021  Medication Sig   albuterol (VENTOLIN HFA) 108 (90  Base) MCG/ACT inhaler Inhale 2 puffs into the lungs every 6 (six) hours as needed for wheezing or shortness of breath.   apixaban (ELIQUIS) 5 MG TABS tablet Take 1 tablet (5 mg total) by mouth 2 (two) times daily.   atorvastatin (LIPITOR) 20 MG tablet Take 1 tablet (20 mg total) by mouth daily.   bisoprolol-hydrochlorothiazide (ZIAC) 10-6.25 MG tablet Take 1 tablet by mouth daily.   calcium gluconate 500 MG tablet Take 1 tablet by mouth daily.    cetirizine (ZYRTEC) 10 MG tablet Take 10 mg by mouth daily.   cholecalciferol (VITAMIN D) 1000 units tablet Take 2,000 Units by mouth 2 (two) times daily.   desvenlafaxine (PRISTIQ) 50 MG 24 hr tablet Take 1 tablet (50 mg total) by mouth daily.   Flaxseed, Linseed,  (FLAX SEED OIL) 1000 MG CAPS Take 2,000 mg by mouth daily.   fluticasone (FLONASE) 50 MCG/ACT nasal spray Place 2 sprays into both nostrils daily as needed for allergies or rhinitis.   furosemide (LASIX) 20 MG tablet Take 1 tablet (20 mg total) by mouth daily for 7 days.   Glucosamine 500 MG CAPS Take 1,000 mg by mouth 2 (two) times daily.    levothyroxine (SYNTHROID) 88 MCG tablet Take 1 tablet (88 mcg total) by mouth daily.   Omega-3 Fatty Acids (FISH OIL) 1000 MG CAPS Take 2 capsules by mouth daily.   Probiotic Product (PROBIOTIC ADVANCED PO) Take by mouth daily.   tobramycin (TOBREX) 0.3 % ophthalmic solution Place into the right eye.   traZODone (DESYREL) 50 MG tablet Take 0.5-1 tablets (25-50 mg total) by mouth at bedtime as needed for sleep.   Turmeric 500 MG CAPS Take 500 mg by mouth daily.   umeclidinium-vilanterol (ANORO ELLIPTA) 62.5-25 MCG/ACT AEPB Inhale 1 puff into the lungs daily at 6 (six) AM.   No facility-administered encounter medications on file as of 09/16/2021.    Allergies  Allergen Reactions   Penicillins Hives   Singulair [Montelukast Sodium]     headache   Codeine Rash    Review of Systems      Objective:  There were no vitals taken for this visit.   Wt Readings from Last 3 Encounters:  09/10/21 54.4 kg  08/24/21 54.2 kg  06/16/21 54.8 kg    Physical Exam  Results for orders placed or performed during the hospital encounter of 34/74/25  Basic metabolic panel  Result Value Ref Range   Sodium 138 135 - 145 mmol/L   Potassium 3.8 3.5 - 5.1 mmol/L   Chloride 101 98 - 111 mmol/L   CO2 29 22 - 32 mmol/L   Glucose, Bld 95 70 - 99 mg/dL   BUN 16 8 - 23 mg/dL   Creatinine, Ser 0.62 0.44 - 1.00 mg/dL   Calcium 8.7 (L) 8.9 - 10.3 mg/dL   GFR, Estimated >60 >60 mL/min   Anion gap 8 5 - 15  CBC  Result Value Ref Range   WBC 8.8 4.0 - 10.5 K/uL   RBC 4.11 3.87 - 5.11 MIL/uL   Hemoglobin 13.0 12.0 - 15.0 g/dL   HCT 38.4 36.0 - 46.0 %   MCV 93.4 80.0  - 100.0 fL   MCH 31.6 26.0 - 34.0 pg   MCHC 33.9 30.0 - 36.0 g/dL   RDW 14.2 11.5 - 15.5 %   Platelets 262 150 - 400 K/uL   nRBC 0.0 0.0 - 0.2 %  Brain natriuretic peptide  Result Value Ref Range   B Natriuretic  Peptide 676.0 (H) 0.0 - 100.0 pg/mL  Troponin I (High Sensitivity)  Result Value Ref Range   Troponin I (High Sensitivity) 15 <18 ng/L       Pertinent labs & imaging results that were available during my care of the patient were reviewed by me and considered in my medical decision making.  Assessment & Plan:  Tammie Martin was seen today for follow-up.  Diagnoses and all orders for this visit:  New onset of congestive heart failure ([place])     Continue all other maintenance medications.  Follow up plan: No follow-ups on file.   Continue healthy lifestyle choices, including diet (rich in fruits, vegetables, and lean proteins, and low in salt and simple carbohydrates) and exercise (at least 30 minutes of moderate physical activity daily).  Educational handout given for ***  The above assessment and management plan was discussed with the patient. The patient verbalized understanding of and has agreed to the management plan. Patient is aware to call the clinic if they develop any new symptoms or if symptoms persist or worsen. Patient is aware when to return to the clinic for a follow-up visit. Patient educated on when it is appropriate to go to the emergency department.   Monia Pouch, FNP-C [facility] [phone]

## 2021-09-16 NOTE — Telephone Encounter (Signed)
Done by Thayer Ohm and pt aware

## 2021-09-17 ENCOUNTER — Ambulatory Visit: Payer: PPO | Admitting: Cardiology

## 2021-09-17 LAB — BMP8+EGFR
BUN/Creatinine Ratio: 22 (ref 12–28)
BUN: 18 mg/dL (ref 8–27)
CO2: 30 mmol/L — ABNORMAL HIGH (ref 20–29)
Calcium: 9.5 mg/dL (ref 8.7–10.3)
Chloride: 97 mmol/L (ref 96–106)
Creatinine, Ser: 0.81 mg/dL (ref 0.57–1.00)
Glucose: 102 mg/dL — ABNORMAL HIGH (ref 70–99)
Potassium: 4.2 mmol/L (ref 3.5–5.2)
Sodium: 140 mmol/L (ref 134–144)
eGFR: 77 mL/min/{1.73_m2} (ref >59)

## 2021-09-17 LAB — BRAIN NATRIURETIC PEPTIDE: BNP: 338.5 pg/mL — ABNORMAL HIGH (ref 0.0–100.0)

## 2021-09-21 DIAGNOSIS — M9903 Segmental and somatic dysfunction of lumbar region: Secondary | ICD-10-CM | POA: Diagnosis not present

## 2021-09-21 DIAGNOSIS — M9901 Segmental and somatic dysfunction of cervical region: Secondary | ICD-10-CM | POA: Diagnosis not present

## 2021-09-21 DIAGNOSIS — M9902 Segmental and somatic dysfunction of thoracic region: Secondary | ICD-10-CM | POA: Diagnosis not present

## 2021-09-27 ENCOUNTER — Ambulatory Visit: Payer: Medicare HMO | Admitting: Cardiology

## 2021-09-27 ENCOUNTER — Encounter: Payer: Self-pay | Admitting: Cardiology

## 2021-09-27 VITALS — BP 110/76 | HR 89 | Ht 63.0 in | Wt 120.2 lb

## 2021-09-27 DIAGNOSIS — I5032 Chronic diastolic (congestive) heart failure: Secondary | ICD-10-CM | POA: Diagnosis not present

## 2021-09-27 DIAGNOSIS — I350 Nonrheumatic aortic (valve) stenosis: Secondary | ICD-10-CM | POA: Diagnosis not present

## 2021-09-27 DIAGNOSIS — D6869 Other thrombophilia: Secondary | ICD-10-CM | POA: Diagnosis not present

## 2021-09-27 DIAGNOSIS — I10 Essential (primary) hypertension: Secondary | ICD-10-CM | POA: Diagnosis not present

## 2021-09-27 DIAGNOSIS — I4819 Other persistent atrial fibrillation: Secondary | ICD-10-CM

## 2021-09-27 MED ORDER — BISOPROLOL FUMARATE 10 MG PO TABS: 15.0000 mg | ORAL_TABLET | Freq: Every day | ORAL | 1 refills | Status: DC

## 2021-09-27 NOTE — Progress Notes (Signed)
Clinical Summary Ms. Bessire is a 71 y.o.female seen as a new consult, referred by Dr Lajuana Ripple for the following medical problems.  1.Afib - recently diagnosed by pcp 08/24/21, EKG review  does confirm afib - had 3 months of palpitations prior - she is on bisoprolol - ongonig symptoms of palpitations, occurs daily, lasts a few minutes. Can come on with exertion. Some associated SOB. - thinks she may have missed a dose of eliquis yesterday.  - no bleeding on eliquis, missed a dose yesterday 09/26/21     2.Leg edema -seen in ER Jan 2023 with leg edema - BNP elevated 676. CXR mild cardiomegaly, no acute process - LE venous US no DVT. + for large popliteal cyst - received lasix in ER, discharged -11/2015 echo: LVEF 60-65%, grade II dd  - she is on lasix 20mg  at home - swelling is controlled.    3. Aortic stenosis -mild AS by 11/2015 echo. Mean grade 9  AVA 1.79   Past Medical History:  Diagnosis Date   Depression    DJD (degenerative joint disease)    Hyperlipidemia    Hypertension    Thyroid disease      Allergies  Allergen Reactions   Penicillins Hives   Singulair [Montelukast Sodium]     headache   Codeine Rash     Current Outpatient Medications  Medication Sig Dispense Refill   albuterol (VENTOLIN HFA) 108 (90 Base) MCG/ACT inhaler Inhale 2 puffs into the lungs every 6 (six) hours as needed for wheezing or shortness of breath. 8 g 0   apixaban (ELIQUIS) 5 MG TABS tablet Take 1 tablet (5 mg total) by mouth 2 (two) times daily. 60 tablet 2   atorvastatin (LIPITOR) 20 MG tablet Take 1 tablet (20 mg total) by mouth daily. 90 tablet 3   bisoprolol-hydrochlorothiazide (ZIAC) 10-6.25 MG tablet Take 1 tablet by mouth daily. 90 tablet 3   calcium gluconate 500 MG tablet Take 1 tablet by mouth daily.      cetirizine (ZYRTEC) 10 MG tablet Take 10 mg by mouth daily.     cholecalciferol (VITAMIN D) 1000 units tablet Take 2,000 Units by mouth 2 (two) times daily.      desvenlafaxine (PRISTIQ) 50 MG 24 hr tablet Take 1 tablet (50 mg total) by mouth daily. 90 tablet 1   Flaxseed, Linseed, (FLAX SEED OIL) 1000 MG CAPS Take 2,000 mg by mouth daily.     fluticasone (FLONASE) 50 MCG/ACT nasal spray Place 2 sprays into both nostrils daily as needed for allergies or rhinitis. 48 g 3   furosemide (LASIX) 20 MG tablet Take 1 tablet (20 mg total) by mouth daily. 30 tablet 3   Glucosamine 500 MG CAPS Take 1,000 mg by mouth 2 (two) times daily.      levothyroxine (SYNTHROID) 88 MCG tablet Take 1 tablet (88 mcg total) by mouth daily. 90 tablet 3   Omega-3 Fatty Acids (FISH OIL) 1000 MG CAPS Take 2 capsules by mouth daily.     Probiotic Product (PROBIOTIC ADVANCED PO) Take by mouth daily.     tobramycin (TOBREX) 0.3 % ophthalmic solution Place into the right eye.     traZODone (DESYREL) 50 MG tablet Take 0.5-1 tablets (25-50 mg total) by mouth at bedtime as needed for sleep. 90 tablet 3   Turmeric 500 MG CAPS Take 500 mg by mouth daily.     umeclidinium-vilanterol (ANORO ELLIPTA) 62.5-25 MCG/ACT AEPB Inhale 1 puff into the lungs daily at 6 (  six) AM. 1 each 0   No current facility-administered medications for this visit.     Past Surgical History:  Procedure Laterality Date   ABDOMINAL HYSTERECTOMY     APPENDECTOMY     BREAST ENHANCEMENT SURGERY     TONSILLECTOMY       Allergies  Allergen Reactions   Penicillins Hives   Singulair [Montelukast Sodium]     headache   Codeine Rash      Family History  Problem Relation Age of Onset   Cancer Mother        breast cancer at 40    Kidney disease Mother    Cancer Maternal Aunt        colon cancer   Heart attack Father    Diabetes Sister    Obesity Sister    Depression Sister    Alcohol abuse Brother    Cirrhosis Brother    Depression Brother    Liver disease Brother    Diabetes Daughter    Fibromyalgia Daughter    Depression Daughter    Anxiety disorder Daughter    Arthritis Daughter     Depression Sister    Obesity Sister      Social History Ms. Deakins reports that she has been smoking cigarettes. She has a 40.00 pack-year smoking history. She has never used smokeless tobacco. Ms. Ortlieb reports no history of alcohol use.   Review of Systems CONSTITUTIONAL: No weight loss, fever, chills, weakness or fatigue.  HEENT: Eyes: No visual loss, blurred vision, double vision or yellow sclerae.No hearing loss, sneezing, congestion, runny nose or sore throat.  SKIN: No rash or itching.  CARDIOVASCULAR: per hpi RESPIRATORY: per hpi GASTROINTESTINAL: No anorexia, nausea, vomiting or diarrhea. No abdominal pain or blood.  GENITOURINARY: No burning on urination, no polyuria NEUROLOGICAL: No headache, dizziness, syncope, paralysis, ataxia, numbness or tingling in the extremities. No change in bowel or bladder control.  MUSCULOSKELETAL: No muscle, back pain, joint pain or stiffness.  LYMPHATICS: No enlarged nodes. No history of splenectomy.  PSYCHIATRIC: No history of depression or anxiety.  ENDOCRINOLOGIC: No reports of sweating, cold or heat intolerance. No polyuria or polydipsia.  Marland Kitchen   Physical Examination Today's Vitals   09/27/21 0852  BP: 110/76  Pulse: 89  SpO2: 97%  Weight: 120 lb 3.2 oz (54.5 kg)  Height: 5\' 3"  (1.6 m)   Body mass index is 21.29 kg/m.  Gen: resting comfortably, no acute distress HEENT: no scleral icterus, pupils equal round and reactive, no palptable cervical adenopathy,  CV: irreg, 2/6 systolic murmur rusb, no jvd Resp: Clear to auscultation bilaterally GI: abdomen is soft, non-tender, non-distended, normal bowel sounds, no hepatosplenomegaly MSK: extremities are warm, no edema.  Skin: warm, no rash Neuro:  no focal deficits Psych: appropriate affect   Diagnostic Studies 11/2015 echo Study Conclusions   - Left ventricle: The cavity size was normal. Wall thickness was    increased in a pattern of mild LVH. Systolic function was  normal.    The estimated ejection fraction was in the range of 60% to 65%.    Wall motion was normal; there were no regional wall motion    abnormalities. Features are consistent with a pseudonormal left    ventricular filling pattern, with concomitant abnormal relaxation    and increased filling pressure (grade 2 diastolic dysfunction).    Doppler parameters are consistent with high ventricular filling    pressure.  - Aortic valve: Moderately calcified annulus. Trileaflet;    moderately thickened  leaflets. There was mild stenosis. There was    mild regurgitation. Valve area (VTI): 1.79 cm^2. Valve area    (Vmax): 1.63 cm^2.  - Mitral valve: Moderately calcified annulus. Mildly thickened    leaflets . There was mild regurgitation.  - Left atrium: The atrium was mildly dilated.  - Right atrium: The atrium was mildly dilated.  - Atrial septum: No defect or patent foramen ovale was identified.  - Pulmonary arteries: Systolic pressure was mildly increased. PA    peak pressure: 35 mm Hg (S).  - Technically adequate study.     Assessment and Plan   1.Persistent afib/acquired thrombophilia - ongoing symptoms - EKG today shows she remains in afib, rate controlled - given new diagnosis of afib would plan for trial of DCCV, would need to wait 3 weeks since missed eliquis dose yesterday. Present in 3 weeks for nursing visit and EKG, if still in afib plan for dccv - with ongoing symptoms stop bisop/hctz combo, start back bisoprolol 15mg  daily by itself  2. Chronic diastolic HF - fairly new onset edema recently, controlled with lasix - continue lasix, repeat echo.  3. Aortic stenosis - repeat echo  Nursing visit 3 weeks for EKG, if still afib plan for DCCV.  F/u 2 months.     Arnoldo Lenis, M.D.

## 2021-09-27 NOTE — Patient Instructions (Addendum)
Medication Instructions:  Your physician has recommended you make the following change in your medication:  Stop bisoprolol/hctz (Ziac) Start bisoprolol 15 mg daily Continue other medications the same  Labwork: none  Testing/Procedures: Your physician has requested that you have an echocardiogram. Echocardiography is a painless test that uses sound waves to create images of your heart. It provides your doctor with information about the size and shape of your heart and how well your hearts chambers and valves are working. This procedure takes approximately one hour. There are no restrictions for this procedure.  Follow-Up: Your physician recommends that you schedule a follow-up appointment in: 2 months Your physician recommends that you schedule a nurse visit appointment the week of February 20th for EKG.  Any Other Special Instructions Will Be Listed Below (If Applicable).  If you need a refill on your cardiac medications before your next appointment, please call your pharmacy.

## 2021-10-18 ENCOUNTER — Ambulatory Visit: Payer: Medicare HMO

## 2021-10-19 ENCOUNTER — Ambulatory Visit (INDEPENDENT_AMBULATORY_CARE_PROVIDER_SITE_OTHER): Payer: Medicare HMO

## 2021-10-19 VITALS — BP 100/62 | HR 82 | Ht 63.0 in | Wt 123.8 lb

## 2021-10-19 DIAGNOSIS — I4819 Other persistent atrial fibrillation: Secondary | ICD-10-CM | POA: Diagnosis not present

## 2021-10-19 NOTE — Progress Notes (Signed)
Patient here today for a nurse visit to have EKG done.  Patient states that she is not currently having any symptoms and or concerns at the moment.  Informed patient that EKG will be given to Dr. [name] for review.  Patient verbalized understanding.

## 2021-10-20 ENCOUNTER — Other Ambulatory Visit: Payer: Medicare HMO

## 2021-10-20 ENCOUNTER — Ambulatory Visit (INDEPENDENT_AMBULATORY_CARE_PROVIDER_SITE_OTHER): Payer: Medicare HMO

## 2021-10-20 DIAGNOSIS — I4819 Other persistent atrial fibrillation: Secondary | ICD-10-CM | POA: Diagnosis not present

## 2021-10-20 LAB — ECHOCARDIOGRAM COMPLETE
AR max vel: 0.7 cm2
AV Area VTI: 0.78 cm2
AV Area mean vel: 0.69 cm2
AV Mean grad: 23.7 mmHg
AV Peak grad: 37.2 mmHg
Ao pk vel: 3.05 m/s
Area-P 1/2: 3.42 cm2
MV M vel: 5.58 m/s
MV Peak grad: 124.5 mmHg
P 1/2 time: 537 msec
S' Lateral: 2.76 cm

## 2021-10-22 ENCOUNTER — Telehealth: Payer: Self-pay

## 2021-10-22 ENCOUNTER — Other Ambulatory Visit: Payer: Self-pay | Admitting: Cardiology

## 2021-10-22 DIAGNOSIS — I4891 Unspecified atrial fibrillation: Secondary | ICD-10-CM

## 2021-10-22 NOTE — Telephone Encounter (Signed)
Cardioversion scheduled with Dr. [name] for 3/1 @ 11:15 Pre op appt: Monday 2/27 @ 2:15  Made aware of appointment dates and time Instructions given over the phone, copy sent to mychart. Verbalized understanding.

## 2021-10-22 NOTE — Patient Instructions (Signed)
Tammie Martin  10/22/2021     @PREFPERIOPPHARMACY @   Your procedure is scheduled on  10/27/2021.   [facility] to [facility]   Call this number if you have problems the morning of surgery:  [phone]   Remember:  Do not eat or drink after midnight.      DO NOT miss any doses of your eliquis.    Take these medicines the morning of surgery with A SIP OF WATER                                 pristiq, levothyroxine.     Do not wear jewelry, make-up or nail polish.  Do not wear lotions, powders, or perfumes, or deodorant.  Do not shave 48 hours prior to surgery.  Men may shave face and neck.  Do not bring valuables to the hospital.  [facility] is not responsible for any belongings or valuables.  Contacts, dentures or bridgework may not be worn into surgery.  Leave your suitcase in the car.  After surgery it may be brought to your room.  For patients admitted to the hospital, discharge time will be determined by your treatment team.  Patients discharged the day of surgery will not be allowed to drive home and must have someone with them for 24 hours.    Special instructions:   DO NOT smoke tobacco or vape for 24 hours before your procedure.   Monitored Anesthesia Care, Care After This sheet gives you information about how to care for yourself after your procedure. Your health care provider may also give you more specific instructions. If you have problems or questions, contact your health care provider. What can I expect after the procedure? After the procedure, it is common to have: Tiredness. Forgetfulness about what happened after the procedure. Impaired judgment for important decisions. Nausea or vomiting. Some difficulty with balance. Follow these instructions at home: For the time period you were told by your health care provider:   Rest as needed. Do not participate in activities where you could fall or become injured. Do not drive or use  machinery. Do not drink alcohol. Do not take sleeping pills or medicines that cause drowsiness. Do not make important decisions or sign legal documents. Do not take care of children on your own. Eating and drinking Follow the diet that is recommended by your health care provider. Drink enough fluid to keep your urine pale yellow. If you vomit: Drink water, juice, or soup when you can drink without vomiting. Make sure you have little or no nausea before eating solid foods. General instructions Have a responsible adult stay with you for the time you are told. It is important to have someone help care for you until you are awake and alert. Take over-the-counter and prescription medicines only as told by your health care provider. If you have sleep apnea, surgery and certain medicines can increase your risk for breathing problems. Follow instructions from your health care provider about wearing your sleep device: Anytime you are sleeping, including during daytime naps. While taking prescription pain medicines, sleeping medicines, or medicines that make you drowsy. Avoid smoking. Keep all follow-up visits as told by your health care provider. This is important. Contact a health care provider if: You keep feeling nauseous or you keep vomiting. You feel light-headed. You are still sleepy or having trouble with balance after  24 hours. You develop a rash. You have a fever. You have redness or swelling around the IV site. Get help right away if: You have trouble breathing. You have new-onset confusion at home. Summary For several hours after your procedure, you may feel tired. You may also be forgetful and have poor judgment. Have a responsible adult stay with you for the time you are told. It is important to have someone help care for you until you are awake and alert. Rest as told. Do not drive or operate machinery. Do not drink alcohol or take sleeping pills. Get help right away if you have  trouble breathing, or if you suddenly become confused. This information is not intended to replace advice given to you by your health care provider. Make sure you discuss any questions you have with your health care provider. Document Revised: 04/30/2020 Document Reviewed: 07/18/2019 Elsevier Patient Education  2022 [employer].

## 2021-10-25 ENCOUNTER — Encounter (HOSPITAL_COMMUNITY)
Admission: RE | Admit: 2021-10-25 | Discharge: 2021-10-25 | Disposition: A | Discharge status: Home / Self Care | Payer: Medicare HMO | Source: Ambulatory Visit | Attending: Cardiology | Admitting: Cardiology

## 2021-10-25 DIAGNOSIS — Z79899 Other long term (current) drug therapy: Secondary | ICD-10-CM

## 2021-10-26 ENCOUNTER — Encounter (HOSPITAL_COMMUNITY): Payer: Self-pay

## 2021-10-26 ENCOUNTER — Encounter (HOSPITAL_COMMUNITY)
Admission: RE | Admit: 2021-10-26 | Discharge: 2021-10-26 | Disposition: A | Discharge status: Home / Self Care | Payer: Medicare HMO | Source: Ambulatory Visit | Attending: Cardiology | Admitting: Cardiology

## 2021-10-26 ENCOUNTER — Other Ambulatory Visit: Payer: Medicare HMO

## 2021-10-26 DIAGNOSIS — Z01812 Encounter for preprocedural laboratory examination: Secondary | ICD-10-CM | POA: Insufficient documentation

## 2021-10-26 DIAGNOSIS — Z79899 Other long term (current) drug therapy: Secondary | ICD-10-CM

## 2021-10-26 LAB — BASIC METABOLIC PANEL
Anion gap: 9 (ref 5–15)
BUN: 18 mg/dL (ref 8–23)
CO2: 30 mmol/L (ref 22–32)
Calcium: 9 mg/dL (ref 8.9–10.3)
Chloride: 100 mmol/L (ref 98–111)
Creatinine, Ser: 0.58 mg/dL (ref 0.44–1.00)
GFR, Estimated: >60 mL/min (ref >60)
Glucose, Bld: 102 mg/dL — ABNORMAL HIGH (ref 70–99)
Potassium: 4.2 mmol/L (ref 3.5–5.1)
Sodium: 139 mmol/L (ref 135–145)

## 2021-10-27 ENCOUNTER — Telehealth: Payer: Self-pay | Admitting: *Deleted

## 2021-10-27 ENCOUNTER — Ambulatory Visit (HOSPITAL_COMMUNITY): Payer: Medicare HMO | Admitting: Anesthesiology

## 2021-10-27 ENCOUNTER — Ambulatory Visit (HOSPITAL_BASED_OUTPATIENT_CLINIC_OR_DEPARTMENT_OTHER): Payer: Medicare HMO | Admitting: Anesthesiology

## 2021-10-27 ENCOUNTER — Encounter (HOSPITAL_COMMUNITY)
Admission: RE | Disposition: A | Discharge status: Home / Self Care | Payer: Medicare HMO | Source: Home / Self Care | Attending: Cardiology

## 2021-10-27 ENCOUNTER — Encounter (HOSPITAL_COMMUNITY): Payer: Self-pay | Admitting: Cardiology

## 2021-10-27 ENCOUNTER — Ambulatory Visit (HOSPITAL_COMMUNITY)
Admission: RE | Admit: 2021-10-27 | Discharge: 2021-10-27 | Disposition: A | Discharge status: Home / Self Care | Payer: Medicare HMO | Attending: Cardiology | Admitting: Cardiology

## 2021-10-27 DIAGNOSIS — Z7901 Long term (current) use of anticoagulants: Secondary | ICD-10-CM | POA: Insufficient documentation

## 2021-10-27 DIAGNOSIS — I4891 Unspecified atrial fibrillation: Secondary | ICD-10-CM

## 2021-10-27 DIAGNOSIS — I10 Essential (primary) hypertension: Secondary | ICD-10-CM | POA: Diagnosis not present

## 2021-10-27 DIAGNOSIS — I4819 Other persistent atrial fibrillation: Secondary | ICD-10-CM | POA: Diagnosis not present

## 2021-10-27 DIAGNOSIS — D6859 Other primary thrombophilia: Secondary | ICD-10-CM | POA: Insufficient documentation

## 2021-10-27 DIAGNOSIS — Z79899 Other long term (current) drug therapy: Secondary | ICD-10-CM | POA: Diagnosis not present

## 2021-10-27 DIAGNOSIS — I110 Hypertensive heart disease with heart failure: Secondary | ICD-10-CM | POA: Diagnosis not present

## 2021-10-27 DIAGNOSIS — R69 Illness, unspecified: Secondary | ICD-10-CM | POA: Diagnosis not present

## 2021-10-27 DIAGNOSIS — E039 Hypothyroidism, unspecified: Secondary | ICD-10-CM | POA: Insufficient documentation

## 2021-10-27 DIAGNOSIS — I350 Nonrheumatic aortic (valve) stenosis: Secondary | ICD-10-CM | POA: Insufficient documentation

## 2021-10-27 DIAGNOSIS — F418 Other specified anxiety disorders: Secondary | ICD-10-CM

## 2021-10-27 DIAGNOSIS — I5032 Chronic diastolic (congestive) heart failure: Secondary | ICD-10-CM | POA: Diagnosis not present

## 2021-10-27 DIAGNOSIS — F17210 Nicotine dependence, cigarettes, uncomplicated: Secondary | ICD-10-CM | POA: Diagnosis not present

## 2021-10-27 HISTORY — PX: CARDIOVERSION: SHX1299

## 2021-10-27 SURGERY — CARDIOVERSION
Anesthesia: General

## 2021-10-27 MED ORDER — SODIUM CHLORIDE 0.9 % IV SOLN: INTRAVENOUS | Status: DC

## 2021-10-27 MED ORDER — ORAL CARE MOUTH RINSE: 15.0000 mL | Freq: Once | OROMUCOSAL | Status: AC

## 2021-10-27 MED ORDER — CHLORHEXIDINE GLUCONATE 0.12 % MT SOLN
15.0000 mL | Freq: Once | OROMUCOSAL | Status: AC
  Administered 2021-10-27: 15 mL via OROMUCOSAL

## 2021-10-27 MED ORDER — BISOPROLOL FUMARATE 10 MG PO TABS: 10.0000 mg | ORAL_TABLET | Freq: Every day | ORAL | 1 refills | Status: DC

## 2021-10-27 MED ORDER — PROPOFOL 10 MG/ML IV BOLUS
INTRAVENOUS | Status: DC | PRN
  Administered 2021-10-27 (×2): 50 mg via INTRAVENOUS

## 2021-10-27 MED ORDER — LACTATED RINGERS IV SOLN
INTRAVENOUS | Status: DC
  Administered 2021-10-27: 1000 mL via INTRAVENOUS

## 2021-10-27 MED ORDER — APIXABAN 5 MG PO TABS
5.0000 mg | ORAL_TABLET | Freq: Once | ORAL | Status: AC
  Administered 2021-10-27: 5 mg via ORAL
  Filled 2021-10-27: qty 1

## 2021-10-27 NOTE — Anesthesia Preprocedure Evaluation (Addendum)
Anesthesia Evaluation  ?Patient identified by MRN, date of birth, ID band ?Patient awake ? ? ? ?Reviewed: ?Allergy & Precautions, NPO status , Patient's Chart, lab work & pertinent test results ? ?Airway ?Mallampati: II ? ?TM Distance: >3 FB ?Neck ROM: Full ? ? ? Dental ? ?(+) Dental Advisory Given, Upper Dentures, Lower Dentures ?  ?Pulmonary ?shortness of breath and with exertion, Current Smoker and Patient abstained from smoking.,  ?  ?Pulmonary exam normal ?breath sounds clear to auscultation ? ? ? ? ? ? Cardiovascular ?Exercise Tolerance: Poor ?hypertension, Pt. on medications ?+ dysrhythmias + Valvular Problems/Murmurs  ?Rhythm:Irregular Rate:Normal ? ? ?  ?Neuro/Psych ?PSYCHIATRIC DISORDERS Anxiety Depression  Neuromuscular disease (intermittent lower extremity numbness)   ? GI/Hepatic ?negative GI ROS, Neg liver ROS,   ?Endo/Other  ?Hypothyroidism  ? Renal/GU ?negative Renal ROS  ?negative genitourinary ?  ?Musculoskeletal ? ?(+) Arthritis ,  ? Abdominal ?  ?Peds ?negative pediatric ROS ?(+)  Hematology ?negative hematology ROS ?(+)   ?Anesthesia Other Findings ? ? Reproductive/Obstetrics ?negative OB ROS ? ?  ? ? ? ? ? ? ? ? ? ? ? ? ? ?  ?  ? ? ? ? ?Anesthesia Physical ?Anesthesia Plan ? ?ASA: 3 ? ?Anesthesia Plan: General  ? ?Post-op Pain Management: Minimal or no pain anticipated  ? ?Induction: Intravenous ? ?PONV Risk Score and Plan: TIVA ? ?Airway Management Planned: Nasal Cannula and Natural Airway ? ?Additional Equipment:  ? ?Intra-op Plan:  ? ?Post-operative Plan:  ? ?Informed Consent: I have reviewed the patients History and Physical, chart, labs and discussed the procedure including the risks, benefits and alternatives for the proposed anesthesia with the patient or authorized representative who has indicated his/her understanding and acceptance.  ? ? ? ? ? ?Plan Discussed with: CRNA and Surgeon ? ?Anesthesia Plan Comments:   ? ? ? ? ? ?Anesthesia Quick  Evaluation ? ?

## 2021-10-27 NOTE — H&P (Signed)
Procedure H&P  Patient presents for outpatient electrical cardioversion for history of atrial fibrillation. For full history please refer to referenced clinic note below. Plan for electrical cardioversion today with assistance of anesthesiology   Tammie Dolly MD   Clinical Summary Tammie Martin is a 71 y.o.female seen as a new consult, referred by Dr Lajuana Ripple for the following medical problems.   1.Afib - recently diagnosed by pcp 08/24/21, EKG review  does confirm afib - had 3 months of palpitations prior - she is on bisoprolol - ongonig symptoms of palpitations, occurs daily, lasts a few minutes. Can come on with exertion. Some associated SOB. - thinks she may have missed a dose of eliquis yesterday.  - no bleeding on eliquis, missed a dose yesterday 09/26/21         2.Leg edema -seen in ER Jan 2023 with leg edema - BNP elevated 676. CXR mild cardiomegaly, no acute process - LE venous US no DVT. + for large popliteal cyst - received lasix in ER, discharged -11/2015 echo: LVEF 60-65%, grade II dd   - she is on lasix 20mg  at home - swelling is controlled.      3. Aortic stenosis -mild AS by 11/2015 echo. Mean grade 9  AVA 1.79         Past Medical History:  Diagnosis Date   Depression     DJD (degenerative joint disease)     Hyperlipidemia     Hypertension     Thyroid disease               Allergies  Allergen Reactions   Penicillins Hives   Singulair [Montelukast Sodium]        headache   Codeine Rash              Current Outpatient Medications  Medication Sig Dispense Refill   albuterol (VENTOLIN HFA) 108 (90 Base) MCG/ACT inhaler Inhale 2 puffs into the lungs every 6 (six) hours as needed for wheezing or shortness of breath. 8 g 0   apixaban (ELIQUIS) 5 MG TABS tablet Take 1 tablet (5 mg total) by mouth 2 (two) times daily. 60 tablet 2   atorvastatin (LIPITOR) 20 MG tablet Take 1 tablet (20 mg total) by mouth daily. 90 tablet 3    bisoprolol-hydrochlorothiazide (ZIAC) 10-6.25 MG tablet Take 1 tablet by mouth daily. 90 tablet 3   calcium gluconate 500 MG tablet Take 1 tablet by mouth daily.        cetirizine (ZYRTEC) 10 MG tablet Take 10 mg by mouth daily.       cholecalciferol (VITAMIN D) 1000 units tablet Take 2,000 Units by mouth 2 (two) times daily.       desvenlafaxine (PRISTIQ) 50 MG 24 hr tablet Take 1 tablet (50 mg total) by mouth daily. 90 tablet 1   Flaxseed, Linseed, (FLAX SEED OIL) 1000 MG CAPS Take 2,000 mg by mouth daily.       fluticasone (FLONASE) 50 MCG/ACT nasal spray Place 2 sprays into both nostrils daily as needed for allergies or rhinitis. 48 g 3   furosemide (LASIX) 20 MG tablet Take 1 tablet (20 mg total) by mouth daily. 30 tablet 3   Glucosamine 500 MG CAPS Take 1,000 mg by mouth 2 (two) times daily.        levothyroxine (SYNTHROID) 88 MCG tablet Take 1 tablet (88 mcg total) by mouth daily. 90 tablet 3   Omega-3 Fatty Acids (FISH OIL) 1000 MG CAPS Take 2 capsules by mouth  daily.       Probiotic Product (PROBIOTIC ADVANCED PO) Take by mouth daily.       tobramycin (TOBREX) 0.3 % ophthalmic solution Place into the right eye.       traZODone (DESYREL) 50 MG tablet Take 0.5-1 tablets (25-50 mg total) by mouth at bedtime as needed for sleep. 90 tablet 3   Turmeric 500 MG CAPS Take 500 mg by mouth daily.       umeclidinium-vilanterol (ANORO ELLIPTA) 62.5-25 MCG/ACT AEPB Inhale 1 puff into the lungs daily at 6 (six) AM. 1 each 0    No current facility-administered medications for this visit.             Past Surgical History:  Procedure Laterality Date   ABDOMINAL HYSTERECTOMY       APPENDECTOMY       BREAST ENHANCEMENT SURGERY       TONSILLECTOMY                 Allergies  Allergen Reactions   Penicillins Hives   Singulair [Montelukast Sodium]        headache   Codeine Rash               Family History  Problem Relation Age of Onset   Cancer Mother          breast cancer at 53     Kidney disease Mother     Cancer Maternal Aunt          colon cancer   Heart attack Father     Diabetes Sister     Obesity Sister     Depression Sister     Alcohol abuse Brother     Cirrhosis Brother     Depression Brother     Liver disease Brother     Diabetes Daughter     Fibromyalgia Daughter     Depression Daughter     Anxiety disorder Daughter     Arthritis Daughter     Depression Sister     Obesity Sister          Social History Tammie Martin reports that she has been smoking cigarettes. She has a 40.00 pack-year smoking history. She has never used smokeless tobacco. Tammie Martin reports no history of alcohol use.     Review of Systems CONSTITUTIONAL: No weight loss, fever, chills, weakness or fatigue.  HEENT: Eyes: No visual loss, blurred vision, double vision or yellow sclerae.No hearing loss, sneezing, congestion, runny nose or sore throat.  SKIN: No rash or itching.  CARDIOVASCULAR: per hpi RESPIRATORY: per hpi GASTROINTESTINAL: No anorexia, nausea, vomiting or diarrhea. No abdominal pain or blood.  GENITOURINARY: No burning on urination, no polyuria NEUROLOGICAL: No headache, dizziness, syncope, paralysis, ataxia, numbness or tingling in the extremities. No change in bowel or bladder control.  MUSCULOSKELETAL: No muscle, back pain, joint pain or stiffness.  LYMPHATICS: No enlarged nodes. No history of splenectomy.  PSYCHIATRIC: No history of depression or anxiety.  ENDOCRINOLOGIC: No reports of sweating, cold or heat intolerance. No polyuria or polydipsia.  Marland Kitchen     Physical Examination    Today's Vitals    09/27/21 0852  BP: 110/76  Pulse: 89  SpO2: 97%  Weight: 120 lb 3.2 oz (54.5 kg)  Height: 5\' 3"  (1.6 m)    Body mass index is 21.29 kg/m.   Gen: resting comfortably, no acute distress HEENT: no scleral icterus, pupils equal round and reactive, no palptable cervical adenopathy,  CV: irreg, 2/6  systolic murmur rusb, no jvd Resp: Clear to  auscultation bilaterally GI: abdomen is soft, non-tender, non-distended, normal bowel sounds, no hepatosplenomegaly MSK: extremities are warm, no edema.  Skin: warm, no rash Neuro:  no focal deficits Psych: appropriate affect     Diagnostic Studies 11/2015 echo Study Conclusions   - Left ventricle: The cavity size was normal. Wall thickness was    increased in a pattern of mild LVH. Systolic function was normal.    The estimated ejection fraction was in the range of 60% to 65%.    Wall motion was normal; there were no regional wall motion    abnormalities. Features are consistent with a pseudonormal left    ventricular filling pattern, with concomitant abnormal relaxation    and increased filling pressure (grade 2 diastolic dysfunction).    Doppler parameters are consistent with high ventricular filling    pressure.  - Aortic valve: Moderately calcified annulus. Trileaflet;    moderately thickened leaflets. There was mild stenosis. There was    mild regurgitation. Valve area (VTI): 1.79 cm^2. Valve area    (Vmax): 1.63 cm^2.  - Mitral valve: Moderately calcified annulus. Mildly thickened    leaflets . There was mild regurgitation.  - Left atrium: The atrium was mildly dilated.  - Right atrium: The atrium was mildly dilated.  - Atrial septum: No defect or patent foramen ovale was identified.  - Pulmonary arteries: Systolic pressure was mildly increased. PA    peak pressure: 35 mm Hg (S).  - Technically adequate study.        Assessment and Plan    1.Persistent afib/acquired thrombophilia - ongoing symptoms - EKG today shows she remains in afib, rate controlled - given new diagnosis of afib would plan for trial of DCCV, would need to wait 3 weeks since missed eliquis dose yesterday. Present in 3 weeks for nursing visit and EKG, if still in afib plan for dccv - with ongoing symptoms stop bisop/hctz combo, start back bisoprolol 15mg  daily by itself   2. Chronic diastolic HF -  fairly new onset edema recently, controlled with lasix - continue lasix, repeat echo.   3. Aortic stenosis - repeat echo   Nursing visit 3 weeks for EKG, if still afib plan for DCCV.  F/u 2 months.         Arnoldo Lenis, M.D.

## 2021-10-27 NOTE — Telephone Encounter (Signed)
Laurine Blazer, LPN  ?04/03/7736  3:66 PM EST Back to Top  ?  ?Notified, copy to pcp.  Appointment scheduled as requested.   ? ?

## 2021-10-27 NOTE — CV Procedure (Addendum)
CV Procedure Note ? ?Procedure: Electrical cardioversion ?Physician: Dr Maryann Alar ?Indication: afib ? ? ?Patient was brought to the procedure suite after appropriate consent was obtained. I confirmed with the patient she had not missed and doses of her eliquis within the last 3 weeks. Defib pads placed in the anterior and posterior positions. Sedation was achieved with the assistance of anesthesiology, for details please refer to there documentation. Converted from afib to sinus rhythm with a single synchronized 200j shock. Cardiopulmonary monitoring was performed throughout the procedure, she tolerated well without complications. ? ?HRs mid 50s after cardiversion, will lower bisoprolol to 10mg  daily.  ? ? ?Carlyle Dolly MD ?

## 2021-10-27 NOTE — Transfer of Care (Signed)
Immediate Anesthesia Transfer of Care Note ? ?Patient: Tammie Martin ? ?Procedure(s) Performed: CARDIOVERSION ? ?Patient Location: PACU ? ?Anesthesia Type:General ? ?Level of Consciousness: awake, alert , oriented and patient cooperative ? ?Airway & Oxygen Therapy: Patient Spontanous Breathing and Patient connected to nasal cannula oxygen ? ?Post-op Assessment: Report given to RN, Post -op Vital signs reviewed and stable and Patient moving all extremities X 4 ? ?Post vital signs: Reviewed and stable ? ?Last Vitals:  ?Vitals Value Taken Time  ?BP    ?Temp    ?Pulse    ?Resp    ?SpO2    ? ? ?Last Pain:  ?Vitals:  ? 10/27/21 1019  ?TempSrc: Oral  ?PainSc: 0-No pain  ?   ? ?Patients Stated Pain Goal: 7 (10/27/21 1019) ? ?Complications: No notable events documented. ?

## 2021-10-27 NOTE — Telephone Encounter (Signed)
-----   Message from Merlene Laughter, RN sent at 10/26/2021  1:42 PM EST ----- ? ?----- Message ----- ?From: Arnoldo Lenis, MD ?Sent: 10/24/2021  10:15 AM EST ?To: Merlene Laughter, RN ? ?Echo shows heart pumping function is normal. She does have some age related stiffness of the heart which can lead to some elevated pressures. Her aortic valve also is moderately to sevrely thickened and something we will need to talk potentally about getting more infomration about. Would focuse initially on her cardioversion next week, can she get a f/u with me March 16 at 2pm to reassess the afib and also discuss more about her heart valve ? ?Zandra Abts MD ? ?

## 2021-10-27 NOTE — Anesthesia Postprocedure Evaluation (Signed)
Anesthesia Post Note ? ?Patient: Tammie Martin ? ?Procedure(s) Performed: CARDIOVERSION ? ?Patient location during evaluation: Phase II ?Anesthesia Type: General ?Level of consciousness: awake and alert and oriented ?Pain management: pain level controlled ?Vital Signs Assessment: post-procedure vital signs reviewed and stable ?Respiratory status: spontaneous breathing, nonlabored ventilation and respiratory function stable ?Cardiovascular status: blood pressure returned to baseline and stable ?Postop Assessment: no apparent nausea or vomiting ?Anesthetic complications: no ? ? ?No notable events documented. ? ? ?Last Vitals:  ?Vitals:  ? 10/27/21 1117 10/27/21 1229  ?BP: 106/69 (!) 150/93  ?Pulse: (!) 52 60  ?Resp: 19 20  ?Temp:  36.7 ?C  ?SpO2: 98% 94%  ?  ?Last Pain:  ?Vitals:  ? 10/27/21 1229  ?TempSrc: Oral  ?PainSc: 0-No pain  ? ? ?  ?  ?  ?  ?  ?  ? ?Carmisha Larusso C Jordanna Hendrie ? ? ? ? ?

## 2021-11-01 ENCOUNTER — Ambulatory Visit (INDEPENDENT_AMBULATORY_CARE_PROVIDER_SITE_OTHER): Payer: Medicare HMO | Admitting: Family Medicine

## 2021-11-01 ENCOUNTER — Encounter: Payer: Self-pay | Admitting: Family Medicine

## 2021-11-01 VITALS — BP 140/81 | HR 56 | Ht 63.0 in | Wt 122.6 lb

## 2021-11-01 DIAGNOSIS — J449 Chronic obstructive pulmonary disease, unspecified: Secondary | ICD-10-CM | POA: Diagnosis not present

## 2021-11-01 DIAGNOSIS — I4891 Unspecified atrial fibrillation: Secondary | ICD-10-CM

## 2021-11-01 MED ORDER — ALBUTEROL SULFATE HFA 108 (90 BASE) MCG/ACT IN AERS: 2.0000 | INHALATION_SPRAY | Freq: Four times a day (QID) | RESPIRATORY_TRACT | 0 refills | Status: AC | PRN

## 2021-11-01 MED ORDER — UMECLIDINIUM-VILANTEROL 62.5-25 MCG/ACT IN AEPB: 1.0000 | INHALATION_SPRAY | Freq: Every day | RESPIRATORY_TRACT | 12 refills | Status: DC

## 2021-11-01 NOTE — Progress Notes (Signed)
? ?Subjective: ?CC: Chronic follow-up ?PCP: Janora Norlander, DO ?PTW:SFKCL H Claudio is a 71 y.o. female presenting to clinic today for: ? ?1.  New onset A-fib, heart failure ?Patient reports that she is doing fairly well.  She is compliant with the bisoprolol, which was reduced from 15 mg to 10 mg.  She is utilizing her blood thinner as prescribed and denies any bleeding issues.  She is status post cardioversion last Wednesday and has a follow-up with her cardiologist next Wednesday.  She was hoping that she would feel a little bit better after the cardioversion but still is fatigued. ? ?2.  COPD ?Patient would like to start on a controller inhaler.  Was prescribed albuterol in the past but has not using it because she is out.  Needs a refill on that as well.  Continues to smoke herbal cigarettes ? ? ?ROS: Per HPI ? ?Allergies  ?Allergen Reactions  ? Penicillins Hives  ? Singulair [Montelukast Sodium]   ?  headache  ? Codeine Rash  ? Other Rash  ?  Band-aids cause rash after a few days  ? ?Past Medical History:  ?Diagnosis Date  ? Depression   ? DJD (degenerative joint disease)   ? Hyperlipidemia   ? Hypertension   ? Thyroid disease   ? ? ?Current Outpatient Medications:  ?  apixaban (ELIQUIS) 5 MG TABS tablet, Take 1 tablet (5 mg total) by mouth 2 (two) times daily., Disp: 60 tablet, Rfl: 2 ?  atorvastatin (LIPITOR) 20 MG tablet, Take 1 tablet (20 mg total) by mouth daily., Disp: 90 tablet, Rfl: 3 ?  bisoprolol (ZEBETA) 10 MG tablet, Take 1 tablet (10 mg total) by mouth daily., Disp: 135 tablet, Rfl: 1 ?  calcium gluconate 500 MG tablet, Take 1 tablet by mouth daily. , Disp: , Rfl:  ?  cetirizine (ZYRTEC) 10 MG tablet, Take 10 mg by mouth daily., Disp: , Rfl:  ?  cholecalciferol (VITAMIN D) 1000 units tablet, Take 2,000 Units by mouth daily., Disp: , Rfl:  ?  desvenlafaxine (PRISTIQ) 50 MG 24 hr tablet, Take 1 tablet (50 mg total) by mouth daily., Disp: 90 tablet, Rfl: 1 ?  fluticasone (FLONASE) 50 MCG/ACT  nasal spray, Place 2 sprays into both nostrils daily as needed for allergies or rhinitis., Disp: 48 g, Rfl: 3 ?  furosemide (LASIX) 20 MG tablet, Take 1 tablet (20 mg total) by mouth daily., Disp: 30 tablet, Rfl: 3 ?  Glucosamine-Chondroitin (MOVE FREE PO), Take 1 tablet by mouth daily., Disp: , Rfl:  ?  levothyroxine (SYNTHROID) 88 MCG tablet, Take 1 tablet (88 mcg total) by mouth daily., Disp: 90 tablet, Rfl: 3 ?  Multiple Vitamins-Minerals (ALIVE WOMENS 50+ PO), Take 1 tablet by mouth daily., Disp: , Rfl:  ?  Multiple Vitamins-Minerals (PRESERVISION AREDS 2 PO), Take 1 capsule by mouth in the morning and at bedtime., Disp: , Rfl:  ?  Omega-3 Fatty Acids (FISH OIL) 1200 MG CAPS, Take 1,200 mg by mouth daily., Disp: , Rfl:  ?  Probiotic Product (PROBIOTIC ADVANCED PO), Take 1 capsule by mouth daily., Disp: , Rfl:  ?  tobramycin (TOBREX) 0.3 % ophthalmic solution, Place 1 drop into the right eye See admin instructions. Instill 1 drop into the right eye three times daily, the day before, day of and day after eye injections, Disp: , Rfl:  ?  traZODone (DESYREL) 50 MG tablet, Take 0.5-1 tablets (25-50 mg total) by mouth at bedtime as needed for sleep. (Patient taking  differently: Take 50 mg by mouth at bedtime as needed for sleep.), Disp: 90 tablet, Rfl: 3 ?  TURMERIC PO, Take 1 capsule by mouth daily., Disp: , Rfl:  ?Social History  ? ?Socioeconomic History  ? Marital status: Widowed  ?  Spouse name: Not on file  ? Number of children: 2  ? Years of education: 59  ? Highest education level: Associate degree: academic program  ?Occupational History  ? Occupation: Retired  ?  Comment: Registered Nurse  ?Tobacco Use  ? Smoking status: Every Day  ?  Packs/day: 1.00  ?  Years: 40.00  ?  Pack years: 40.00  ?  Types: Cigarettes  ? Smokeless tobacco: Never  ?Vaping Use  ? Vaping Use: Never used  ?Substance and Sexual Activity  ? Alcohol use: No  ? Drug use: No  ? Sexual activity: Not Currently  ?Other Topics Concern  ? Not on  file  ?Social History Narrative  ? Lives alone - one cat, one dog  ? ?Social Determinants of Health  ? ?Financial Resource Strain: Low Risk   ? Difficulty of Paying Living Expenses: Not hard at all  ?Food Insecurity: No Food Insecurity  ? Worried About Charity fundraiser in the Last Year: Never true  ? Ran Out of Food in the Last Year: Never true  ?Transportation Needs: No Transportation Needs  ? Lack of Transportation (Medical): No  ? Lack of Transportation (Non-Medical): No  ?Physical Activity: Inactive  ? Days of Exercise per Week: 0 days  ? Minutes of Exercise per Session: 0 min  ?Stress: No Stress Concern Present  ? Feeling of Stress : Not at all  ?Social Connections: Moderately Isolated  ? Frequency of Communication with Friends and Family: More than three times a week  ? Frequency of Social Gatherings with Friends and Family: More than three times a week  ? Attends Religious Services: Never  ? Active Member of Clubs or Organizations: Yes  ? Attends Archivist Meetings: More than 4 times per year  ? Marital Status: Widowed  ?Intimate Partner Violence: Not At Risk  ? Fear of Current or Ex-Partner: No  ? Emotionally Abused: No  ? Physically Abused: No  ? Sexually Abused: No  ? ?Family History  ?Problem Relation Age of Onset  ? Cancer Mother   ?     breast cancer at 66   ? Kidney disease Mother   ? Cancer Maternal Aunt   ?     colon cancer  ? Heart attack Father   ? Diabetes Sister   ? Obesity Sister   ? Depression Sister   ? Alcohol abuse Brother   ? Cirrhosis Brother   ? Depression Brother   ? Liver disease Brother   ? Diabetes Daughter   ? Fibromyalgia Daughter   ? Depression Daughter   ? Anxiety disorder Daughter   ? Arthritis Daughter   ? Depression Sister   ? Obesity Sister   ? ? ?Objective: ?Office vital signs reviewed. ?BP 140/81   Pulse (!) 56   Ht '5\' 3"'$  (1.6 m)   Wt 122 lb 9.6 oz (55.6 kg)   SpO2 95%   BMI 21.72 kg/m?  ? ?Physical Examination:  ?General: Awake, alert, nontoxic-appearing  female.  Smells of cigarettes, No acute distress ?HEENT: Sclera white. ?Cardio: regular rate and rhythm, S1S2 heard, no murmurs appreciated ?Pulm: clear to auscultation bilaterally, no wheezes, rhonchi or rales; normal work of breathing on room air ? ?Assessment/  Plan: ?71 y.o. female  ? ?Chronic obstructive pulmonary disease, unspecified COPD type ([place]) - Plan: umeclidinium-vilanterol (ANORO ELLIPTA) 62.5-25 MCG/ACT AEPB, albuterol (VENTOLIN HFA) 108 (90 Base) MCG/ACT inhaler ? ?New onset a-fib [facility]) ? ?Trial of Anoro.  Sample provided.  Albuterol inhaler provided.  We discussed that she should avoid excessive use of the albuterol as it could increase heart rate.  May need to consider switch over to Xopenex if finding need frequently ? ?She had both rate and rhythm controlled heartbeat today.  She is slightly bradycardic.  I will CC her cardiologist as Juluis Rainier they can inform if they would like me to reduce her bisoprolol further prior to the next visit.  She otherwise seems to be doing well status post cardioversion ? ?No orders of the defined types were placed in this encounter. ? ?No orders of the defined types were placed in this encounter. ? ? ?Janora Norlander, DO ?[facility] ?([phone] ? ?

## 2021-11-03 DIAGNOSIS — M9903 Segmental and somatic dysfunction of lumbar region: Secondary | ICD-10-CM | POA: Diagnosis not present

## 2021-11-03 DIAGNOSIS — M9901 Segmental and somatic dysfunction of cervical region: Secondary | ICD-10-CM | POA: Diagnosis not present

## 2021-11-03 DIAGNOSIS — M9902 Segmental and somatic dysfunction of thoracic region: Secondary | ICD-10-CM | POA: Diagnosis not present

## 2021-11-05 ENCOUNTER — Other Ambulatory Visit: Payer: Self-pay | Admitting: Family Medicine

## 2021-11-05 DIAGNOSIS — I4891 Unspecified atrial fibrillation: Secondary | ICD-10-CM

## 2021-11-11 ENCOUNTER — Other Ambulatory Visit: Payer: Self-pay | Admitting: Cardiology

## 2021-11-11 ENCOUNTER — Other Ambulatory Visit (HOSPITAL_COMMUNITY)
Admission: RE | Admit: 2021-11-11 | Discharge: 2021-11-11 | Disposition: A | Discharge status: Home / Self Care | Payer: Medicare HMO | Source: Ambulatory Visit | Attending: Cardiology | Admitting: Cardiology

## 2021-11-11 ENCOUNTER — Ambulatory Visit: Payer: Medicare HMO | Admitting: Cardiology

## 2021-11-11 ENCOUNTER — Encounter: Payer: Self-pay | Admitting: Cardiology

## 2021-11-11 ENCOUNTER — Other Ambulatory Visit: Payer: Self-pay

## 2021-11-11 VITALS — BP 120/78 | HR 96 | Ht 62.5 in | Wt 123.4 lb

## 2021-11-11 DIAGNOSIS — I4891 Unspecified atrial fibrillation: Secondary | ICD-10-CM | POA: Insufficient documentation

## 2021-11-11 DIAGNOSIS — I350 Nonrheumatic aortic (valve) stenosis: Secondary | ICD-10-CM | POA: Diagnosis not present

## 2021-11-11 DIAGNOSIS — I4819 Other persistent atrial fibrillation: Secondary | ICD-10-CM | POA: Diagnosis not present

## 2021-11-11 LAB — CBC
HCT: 41 % (ref 36.0–46.0)
Hemoglobin: 13.5 g/dL (ref 12.0–15.0)
MCH: 32.3 pg (ref 26.0–34.0)
MCHC: 32.9 g/dL (ref 30.0–36.0)
MCV: 98.1 fL (ref 80.0–100.0)
Platelets: 295 10*3/uL (ref 150–400)
RBC: 4.18 MIL/uL (ref 3.87–5.11)
RDW: 13.6 % (ref 11.5–15.5)
WBC: 8.4 10*3/uL (ref 4.0–10.5)
nRBC: 0 % (ref 0.0–0.2)

## 2021-11-11 LAB — BASIC METABOLIC PANEL
Anion gap: 9 (ref 5–15)
BUN: 28 mg/dL — ABNORMAL HIGH (ref 8–23)
CO2: 30 mmol/L (ref 22–32)
Calcium: 9.1 mg/dL (ref 8.9–10.3)
Chloride: 101 mmol/L (ref 98–111)
Creatinine, Ser: 0.66 mg/dL (ref 0.44–1.00)
GFR, Estimated: >60 mL/min (ref >60)
Glucose, Bld: 96 mg/dL (ref 70–99)
Potassium: 4 mmol/L (ref 3.5–5.1)
Sodium: 140 mmol/L (ref 135–145)

## 2021-11-11 MED ORDER — SODIUM CHLORIDE 0.9% FLUSH: 3.0000 mL | Freq: Two times a day (BID) | INTRAVENOUS | Status: DC

## 2021-11-11 MED ORDER — BISOPROLOL FUMARATE 10 MG PO TABS: 15.0000 mg | ORAL_TABLET | Freq: Every day | ORAL | 3 refills | Status: DC

## 2021-11-11 NOTE — Progress Notes (Signed)
? ? ? ?Clinical Summary ?Tammie Martin is a 71 y.o.female seen today for follow up of the following medical problems.  ? ?1.Afib ?- recently diagnosed by pcp 08/24/21, EKG review  does confirm afib ?- had 3 months of palpitations prior ?- she is on bisoprolol ?- ongonig symptoms of palpitations, occurs daily, lasts a few minutes. Can come on with exertion. Some associated SOB. ?- thinks she may have missed a dose of eliquis yesterday.  ?- no bleeding on eliquis, missed a dose yesterday 09/26/21 ?  ?  ? 10/27/2021 DCCV. HRs 50s after, we lowered her bisoprol to '10mg'$  daily ?- reports recurrent palpitations about 1 week after DCCV ?  ?2.Leg edema ?-seen in ER Jan 2023 with leg edema ?- BNP elevated 676. CXR mild cardiomegaly, no acute process ?- LE venous US no DVT. + for large popliteal cyst ?- received lasix in ER, discharged ?-11/2015 echo: LVEF 60-65%, grade II dd ?  ?- she is on lasix '20mg'$  at home ?- swelling is controlled.  ?  ?  ?3. Aortic stenosis ?-mild AS by 11/2015 echo. Mean grade 9  AVA 1.79 ? ?09/2021 echo: LVEF 60-65%, no WMAs, low normal RV function, severe pulm HTN with mod RV enlargement. Severe BAE. Severe AS Mean gradient 24 mmHg, AVA VTI 0.78, DI 0.31 SVI 30, Criteria would indicate moderate to severe paradoxical low flow low gradient aortic stenosis, favoring the severe end of the spectrum. Visually the valve looks very tight.  ? ?- recent DOE, just cleaning kitchen DOE. DOE just walking dog. Can have some buring in chest.  ? ? ?4. Pulmonary HTN ?- PASP 66 mmHg, mod RV enlargement with low normal function, grade II dd ?- 2017 echo PASP 35 ?- +smoking history being treated for COPD, grade II DD on echo ? ? ?5. COPD ?-carries diagnosis but do not see she has had formal PFTs. Nearlly 50 year smoking history ? ?Past Medical History:  ?Diagnosis Date  ? Depression   ? DJD (degenerative joint disease)   ? Hyperlipidemia   ? Hypertension   ? Thyroid disease   ? ? ? ?Allergies  ?Allergen Reactions  ?  Penicillins Hives  ? Singulair [Montelukast Sodium]   ?  headache  ? Codeine Rash  ? Other Rash  ?  Band-aids cause rash after a few days  ? ? ? ?Current Outpatient Medications  ?Medication Sig Dispense Refill  ? albuterol (VENTOLIN HFA) 108 (90 Base) MCG/ACT inhaler Inhale 2 puffs into the lungs every 6 (six) hours as needed for wheezing or shortness of breath. 8 g 0  ? atorvastatin (LIPITOR) 20 MG tablet Take 1 tablet (20 mg total) by mouth daily. 90 tablet 3  ? bisoprolol (ZEBETA) 10 MG tablet Take 1 tablet (10 mg total) by mouth daily. 135 tablet 1  ? calcium gluconate 500 MG tablet Take 1 tablet by mouth daily.     ? cetirizine (ZYRTEC) 10 MG tablet Take 10 mg by mouth daily.    ? cholecalciferol (VITAMIN D) 1000 units tablet Take 2,000 Units by mouth daily.    ? desvenlafaxine (PRISTIQ) 50 MG 24 hr tablet Take 1 tablet (50 mg total) by mouth daily. 90 tablet 1  ? ELIQUIS 5 MG TABS tablet Take 1 tablet (5 mg total) by mouth 2 (two) times daily. 60 tablet 2  ? fluticasone (FLONASE) 50 MCG/ACT nasal spray Place 2 sprays into both nostrils daily as needed for allergies or rhinitis. 48 g 3  ? furosemide (LASIX)  20 MG tablet Take 1 tablet (20 mg total) by mouth daily. 30 tablet 3  ? Glucosamine-Chondroitin (MOVE FREE PO) Take 1 tablet by mouth daily.    ? levothyroxine (SYNTHROID) 88 MCG tablet Take 1 tablet (88 mcg total) by mouth daily. 90 tablet 3  ? Multiple Vitamins-Minerals (ALIVE WOMENS 50+ PO) Take 1 tablet by mouth daily.    ? Multiple Vitamins-Minerals (PRESERVISION AREDS 2 PO) Take 1 capsule by mouth in the morning and at bedtime.    ? Omega-3 Fatty Acids (FISH OIL) 1200 MG CAPS Take 1,200 mg by mouth daily.    ? Probiotic Product (PROBIOTIC ADVANCED PO) Take 1 capsule by mouth daily.    ? tobramycin (TOBREX) 0.3 % ophthalmic solution Place 1 drop into the right eye See admin instructions. Instill 1 drop into the right eye three times daily, the day before, day of and day after eye injections    ?  traZODone (DESYREL) 50 MG tablet Take 0.5-1 tablets (25-50 mg total) by mouth at bedtime as needed for sleep. (Patient taking differently: Take 50 mg by mouth at bedtime as needed for sleep.) 90 tablet 3  ? TURMERIC PO Take 1 capsule by mouth daily.    ? umeclidinium-vilanterol (ANORO ELLIPTA) 62.5-25 MCG/ACT AEPB Inhale 1 puff into the lungs daily at 6 (six) AM. 60 each 12  ? ?No current facility-administered medications for this visit.  ? ? ? ?Past Surgical History:  ?Procedure Laterality Date  ? ABDOMINAL HYSTERECTOMY    ? APPENDECTOMY    ? BREAST ENHANCEMENT SURGERY    ? CARDIOVERSION N/A 10/27/2021  ? Procedure: CARDIOVERSION;  Surgeon: Arnoldo Lenis, MD;  Location: [facility];  Service: Endoscopy;  Laterality: N/A;  ? TONSILLECTOMY    ? ? ? ?Allergies  ?Allergen Reactions  ? Penicillins Hives  ? Singulair [Montelukast Sodium]   ?  headache  ? Codeine Rash  ? Other Rash  ?  Band-aids cause rash after a few days  ? ? ? ? ?Family History  ?Problem Relation Age of Onset  ? Cancer Mother   ?     breast cancer at 18   ? Kidney disease Mother   ? Cancer Maternal Aunt   ?     colon cancer  ? Heart attack Father   ? Diabetes Sister   ? Obesity Sister   ? Depression Sister   ? Alcohol abuse Brother   ? Cirrhosis Brother   ? Depression Brother   ? Liver disease Brother   ? Diabetes Daughter   ? Fibromyalgia Daughter   ? Depression Daughter   ? Anxiety disorder Daughter   ? Arthritis Daughter   ? Depression Sister   ? Obesity Sister   ? ? ? ?Social History ?Ms. Ke reports that she has been smoking cigarettes. She has a 40.00 pack-year smoking history. She has never used smokeless tobacco. ?Ms. Biola reports no history of alcohol use. ? ? ?Review of Systems ?CONSTITUTIONAL: No weight loss, fever, chills, weakness or fatigue.  ?HEENT: Eyes: No visual loss, blurred vision, double vision or yellow sclerae.No hearing loss, sneezing, congestion, runny nose or sore throat.  ?SKIN: No rash or itching.  ?CARDIOVASCULAR:  per hpi ?RESPIRATORY: No shortness of breath, cough or sputum.  ?GASTROINTESTINAL: No anorexia, nausea, vomiting or diarrhea. No abdominal pain or blood.  ?GENITOURINARY: No burning on urination, no polyuria ?NEUROLOGICAL: No headache, dizziness, syncope, paralysis, ataxia, numbness or tingling in the extremities. No change in bowel or bladder control.  ?  MUSCULOSKELETAL: No muscle, back pain, joint pain or stiffness.  ?LYMPHATICS: No enlarged nodes. No history of splenectomy.  ?PSYCHIATRIC: No history of depression or anxiety.  ?ENDOCRINOLOGIC: No reports of sweating, cold or heat intolerance. No polyuria or polydipsia.  ?. ? ? ?Physical Examination ?Today's Vitals  ? 11/11/21 1353  ?BP: 120/78  ?Pulse: 96  ?SpO2: 95%  ?Weight: 123 lb 6.4 oz (56 kg)  ?Height: 5' 2.5" (1.588 m)  ? ?Body mass index is 22.21 kg/m?. ? ?Gen: resting comfortably, no acute distress ?HEENT: no scleral icterus, pupils equal round and reactive, no palptable cervical adenopathy,  ?CV: irreg, 3/6 systolic murmur rusb, no jvd ?Resp: Clear to auscultation bilaterally ?GI: abdomen is soft, non-tender, non-distended, normal bowel sounds, no hepatosplenomegaly ?MSK: extremities are warm, no edema.  ?Skin: warm, no rash ?Neuro:  no focal deficits ?Psych: appropriate affect ? ? ?Diagnostic Studies ? ?11/2015 echo ?Study Conclusions  ? ?- Left ventricle: The cavity size was normal. Wall thickness was  ?  increased in a pattern of mild LVH. Systolic function was normal.  ?  The estimated ejection fraction was in the range of 60% to 65%.  ?  Wall motion was normal; there were no regional wall motion  ?  abnormalities. Features are consistent with a pseudonormal left  ?  ventricular filling pattern, with concomitant abnormal relaxation  ?  and increased filling pressure (grade 2 diastolic dysfunction).  ?  Doppler parameters are consistent with high ventricular filling  ?  pressure.  ?- Aortic valve: Moderately calcified annulus. Trileaflet;  ?  moderately  thickened leaflets. There was mild stenosis. There was  ?  mild regurgitation. Valve area (VTI): 1.79 cm^2. Valve area  ?  (Vmax): 1.63 cm^2.  ?- Mitral valve: Moderately calcified annulus. Mildly thickened  ?  leaf

## 2021-11-11 NOTE — Patient Instructions (Addendum)
Medication Instructions:  ?Increase Bisoprolol to 15 mg tablets daily ? ?Labwork: ?BMET ?CBC ? ?Testing/Procedures: ?R/L Heart Cath ? ?Follow-Up: ?Keep scheduled April appointment ? ?Any Other Special Instructions Will Be Listed Below (If Applicable). ? ? ? ? ?If you need a refill on your cardiac medications before your next appointment, please call your pharmacy. ? ? ?[facility] ?[address] ?[place] [address] ?Dept: [phone] ?Loc: [phone] ? ?Tammie Martin  11/11/2021 ? ?You are scheduled for a Cardiac Catheterization on Monday, March 27 with Dr. Sherren Mocha. ? ?1. Please arrive at the Main Entrance A at [facility]: [place], [place] [address] at 7:00 AM (This time is two hours before your procedure to ensure your preparation). Free valet parking service is available.  ? ?Special note: Every effort is made to have your procedure done on time. Please understand that emergencies sometimes delay scheduled procedures. ? ?2. Diet: Do not eat solid foods after midnight.  You may have clear liquids until 5 AM upon the day of the procedure. ? ?3. Labs: You will need to have blood drawn on Friday, March 24 at [place] do not need to be fasting. ? ?4. Medication instructions in preparation for your procedure: ? ? Contrast Allergy: No ? ?Stop taking Eliquis (Apixiban) on Saturday, March 25. ? ? ?On the morning of your procedure, take Aspirin 81 mg tablet and any morning medicines NOT listed above.  You may use sips of water. ? ?5. Plan to go home the same day, you will only stay overnight if medically necessary. ?6. You MUST have a responsible adult to drive you home. ?7. An adult MUST be with you the first 24 hours after you arrive home. ?8. Bring a current list of your medications, and the last time and date medication taken. ?9. Bring ID and current insurance cards. ?10.Please  wear clothes that are easy to get on and off and wear slip-on shoes. ? ?Thank you for allowing [place] to care for you! ?  -- [facility] ? ?

## 2021-11-11 NOTE — H&P (View-Only) (Signed)
? ? ? ?Clinical Summary ?Tammie Martin is a 71 y.o.female seen today for follow up of the following medical problems.  ? ?1.Afib ?- recently diagnosed by pcp 08/24/21, EKG review  does confirm afib ?- had 3 months of palpitations prior ?- she is on bisoprolol ?- ongonig symptoms of palpitations, occurs daily, lasts a few minutes. Can come on with exertion. Some associated SOB. ?- thinks she may have missed a dose of eliquis yesterday.  ?- no bleeding on eliquis, missed a dose yesterday 09/26/21 ?  ?  ? 10/27/2021 DCCV. HRs 50s after, we lowered her bisoprol to '10mg'$  daily ?- reports recurrent palpitations about 1 week after DCCV ?  ?2.Leg edema ?-seen in ER Jan 2023 with leg edema ?- BNP elevated 676. CXR mild cardiomegaly, no acute process ?- LE venous US no DVT. + for large popliteal cyst ?- received lasix in ER, discharged ?-11/2015 echo: LVEF 60-65%, grade II dd ?  ?- she is on lasix '20mg'$  at home ?- swelling is controlled.  ?  ?  ?3. Aortic stenosis ?-mild AS by 11/2015 echo. Mean grade 9  AVA 1.79 ? ?09/2021 echo: LVEF 60-65%, no WMAs, low normal RV function, severe pulm HTN with mod RV enlargement. Severe BAE. Severe AS Mean gradient 24 mmHg, AVA VTI 0.78, DI 0.31 SVI 30, Criteria would indicate moderate to severe paradoxical low flow low gradient aortic stenosis, favoring the severe end of the spectrum. Visually the valve looks very tight.  ? ?- recent DOE, just cleaning kitchen DOE. DOE just walking dog. Can have some buring in chest.  ? ? ?4. Pulmonary HTN ?- PASP 66 mmHg, mod RV enlargement with low normal function, grade II dd ?- 2017 echo PASP 35 ?- +smoking history being treated for COPD, grade II DD on echo ? ? ?5. COPD ?-carries diagnosis but do not see she has had formal PFTs. Nearlly 50 year smoking history ? ?Past Medical History:  ?Diagnosis Date  ? Depression   ? DJD (degenerative joint disease)   ? Hyperlipidemia   ? Hypertension   ? Thyroid disease   ? ? ? ?Allergies  ?Allergen Reactions  ?  Penicillins Hives  ? Singulair [Montelukast Sodium]   ?  headache  ? Codeine Rash  ? Other Rash  ?  Band-aids cause rash after a few days  ? ? ? ?Current Outpatient Medications  ?Medication Sig Dispense Refill  ? albuterol (VENTOLIN HFA) 108 (90 Base) MCG/ACT inhaler Inhale 2 puffs into the lungs every 6 (six) hours as needed for wheezing or shortness of breath. 8 g 0  ? atorvastatin (LIPITOR) 20 MG tablet Take 1 tablet (20 mg total) by mouth daily. 90 tablet 3  ? bisoprolol (ZEBETA) 10 MG tablet Take 1 tablet (10 mg total) by mouth daily. 135 tablet 1  ? calcium gluconate 500 MG tablet Take 1 tablet by mouth daily.     ? cetirizine (ZYRTEC) 10 MG tablet Take 10 mg by mouth daily.    ? cholecalciferol (VITAMIN D) 1000 units tablet Take 2,000 Units by mouth daily.    ? desvenlafaxine (PRISTIQ) 50 MG 24 hr tablet Take 1 tablet (50 mg total) by mouth daily. 90 tablet 1  ? ELIQUIS 5 MG TABS tablet Take 1 tablet (5 mg total) by mouth 2 (two) times daily. 60 tablet 2  ? fluticasone (FLONASE) 50 MCG/ACT nasal spray Place 2 sprays into both nostrils daily as needed for allergies or rhinitis. 48 g 3  ? furosemide (LASIX)  20 MG tablet Take 1 tablet (20 mg total) by mouth daily. 30 tablet 3  ? Glucosamine-Chondroitin (MOVE FREE PO) Take 1 tablet by mouth daily.    ? levothyroxine (SYNTHROID) 88 MCG tablet Take 1 tablet (88 mcg total) by mouth daily. 90 tablet 3  ? Multiple Vitamins-Minerals (ALIVE WOMENS 50+ PO) Take 1 tablet by mouth daily.    ? Multiple Vitamins-Minerals (PRESERVISION AREDS 2 PO) Take 1 capsule by mouth in the morning and at bedtime.    ? Omega-3 Fatty Acids (FISH OIL) 1200 MG CAPS Take 1,200 mg by mouth daily.    ? Probiotic Product (PROBIOTIC ADVANCED PO) Take 1 capsule by mouth daily.    ? tobramycin (TOBREX) 0.3 % ophthalmic solution Place 1 drop into the right eye See admin instructions. Instill 1 drop into the right eye three times daily, the day before, day of and day after eye injections    ?  traZODone (DESYREL) 50 MG tablet Take 0.5-1 tablets (25-50 mg total) by mouth at bedtime as needed for sleep. (Patient taking differently: Take 50 mg by mouth at bedtime as needed for sleep.) 90 tablet 3  ? TURMERIC PO Take 1 capsule by mouth daily.    ? umeclidinium-vilanterol (ANORO ELLIPTA) 62.5-25 MCG/ACT AEPB Inhale 1 puff into the lungs daily at 6 (six) AM. 60 each 12  ? ?No current facility-administered medications for this visit.  ? ? ? ?Past Surgical History:  ?Procedure Laterality Date  ? ABDOMINAL HYSTERECTOMY    ? APPENDECTOMY    ? BREAST ENHANCEMENT SURGERY    ? CARDIOVERSION N/A 10/27/2021  ? Procedure: CARDIOVERSION;  Surgeon: Arnoldo Lenis, MD;  Location: [facility];  Service: Endoscopy;  Laterality: N/A;  ? TONSILLECTOMY    ? ? ? ?Allergies  ?Allergen Reactions  ? Penicillins Hives  ? Singulair [Montelukast Sodium]   ?  headache  ? Codeine Rash  ? Other Rash  ?  Band-aids cause rash after a few days  ? ? ? ? ?Family History  ?Problem Relation Age of Onset  ? Cancer Mother   ?     breast cancer at 70   ? Kidney disease Mother   ? Cancer Maternal Aunt   ?     colon cancer  ? Heart attack Father   ? Diabetes Sister   ? Obesity Sister   ? Depression Sister   ? Alcohol abuse Brother   ? Cirrhosis Brother   ? Depression Brother   ? Liver disease Brother   ? Diabetes Daughter   ? Fibromyalgia Daughter   ? Depression Daughter   ? Anxiety disorder Daughter   ? Arthritis Daughter   ? Depression Sister   ? Obesity Sister   ? ? ? ?Social History ?Ms. Weyandt reports that she has been smoking cigarettes. She has a 40.00 pack-year smoking history. She has never used smokeless tobacco. ?Ms. Baroda reports no history of alcohol use. ? ? ?Review of Systems ?CONSTITUTIONAL: No weight loss, fever, chills, weakness or fatigue.  ?HEENT: Eyes: No visual loss, blurred vision, double vision or yellow sclerae.No hearing loss, sneezing, congestion, runny nose or sore throat.  ?SKIN: No rash or itching.  ?CARDIOVASCULAR:  per hpi ?RESPIRATORY: No shortness of breath, cough or sputum.  ?GASTROINTESTINAL: No anorexia, nausea, vomiting or diarrhea. No abdominal pain or blood.  ?GENITOURINARY: No burning on urination, no polyuria ?NEUROLOGICAL: No headache, dizziness, syncope, paralysis, ataxia, numbness or tingling in the extremities. No change in bowel or bladder control.  ?  MUSCULOSKELETAL: No muscle, back pain, joint pain or stiffness.  ?LYMPHATICS: No enlarged nodes. No history of splenectomy.  ?PSYCHIATRIC: No history of depression or anxiety.  ?ENDOCRINOLOGIC: No reports of sweating, cold or heat intolerance. No polyuria or polydipsia.  ?. ? ? ?Physical Examination ?Today's Vitals  ? 11/11/21 1353  ?BP: 120/78  ?Pulse: 96  ?SpO2: 95%  ?Weight: 123 lb 6.4 oz (56 kg)  ?Height: 5' 2.5" (1.588 m)  ? ?Body mass index is 22.21 kg/m?. ? ?Gen: resting comfortably, no acute distress ?HEENT: no scleral icterus, pupils equal round and reactive, no palptable cervical adenopathy,  ?CV: irreg, 3/6 systolic murmur rusb, no jvd ?Resp: Clear to auscultation bilaterally ?GI: abdomen is soft, non-tender, non-distended, normal bowel sounds, no hepatosplenomegaly ?MSK: extremities are warm, no edema.  ?Skin: warm, no rash ?Neuro:  no focal deficits ?Psych: appropriate affect ? ? ?Diagnostic Studies ? ?11/2015 echo ?Study Conclusions  ? ?- Left ventricle: The cavity size was normal. Wall thickness was  ?  increased in a pattern of mild LVH. Systolic function was normal.  ?  The estimated ejection fraction was in the range of 60% to 65%.  ?  Wall motion was normal; there were no regional wall motion  ?  abnormalities. Features are consistent with a pseudonormal left  ?  ventricular filling pattern, with concomitant abnormal relaxation  ?  and increased filling pressure (grade 2 diastolic dysfunction).  ?  Doppler parameters are consistent with high ventricular filling  ?  pressure.  ?- Aortic valve: Moderately calcified annulus. Trileaflet;  ?  moderately  thickened leaflets. There was mild stenosis. There was  ?  mild regurgitation. Valve area (VTI): 1.79 cm^2. Valve area  ?  (Vmax): 1.63 cm^2.  ?- Mitral valve: Moderately calcified annulus. Mildly thickened  ?  leaf

## 2021-11-18 ENCOUNTER — Telehealth: Payer: Self-pay | Admitting: *Deleted

## 2021-11-18 ENCOUNTER — Telehealth: Payer: Self-pay

## 2021-11-18 NOTE — Telephone Encounter (Signed)
-----   Message from Arnoldo Lenis, MD sent at 11/17/2021  3:33 PM EDT ----- ?Normal labs ? ?Zandra Abts MD ?

## 2021-11-18 NOTE — Telephone Encounter (Signed)
Cardiac Catheterization scheduled at [facility] for: Monday November 22, 2021 9 AM ?Arrival time and place: [facility] at: 7 AM ? ? ?No solid food after midnight prior to cath, clear liquids until 5 AM day of procedure. ? ?Medication instructions: ?-Hold: ? Eliquis-none 11/20/21 until post procedure ? Lasix-AM of procedure ?-Except hold medications usual morning medications can be taken with sips of water including aspirin 81 mg. ? ?Confirmed patient has responsible adult to drive home post procedure and be with patient first 24 hours after arriving home. ? ?Patient reports no new symptoms concerning for COVID-19/no exposure to COVID-19 in the past 10 days. ? ?Reviewed procedure instructions with patient.  ? ?

## 2021-11-18 NOTE — Telephone Encounter (Signed)
Patient notified and verbalized understanding. Pt had no questions or concerns at this time 

## 2021-11-22 ENCOUNTER — Encounter (HOSPITAL_COMMUNITY)
Admission: RE | Disposition: A | Discharge status: Home / Self Care | Payer: Self-pay | Source: Home / Self Care | Attending: Cardiovascular Disease

## 2021-11-22 ENCOUNTER — Other Ambulatory Visit: Payer: Self-pay

## 2021-11-22 ENCOUNTER — Ambulatory Visit (HOSPITAL_COMMUNITY)
Admission: RE | Admit: 2021-11-22 | Discharge: 2021-11-22 | Disposition: A | Discharge status: Home / Self Care | Payer: Medicare HMO | Attending: Cardiovascular Disease | Admitting: Cardiovascular Disease

## 2021-11-22 ENCOUNTER — Encounter (HOSPITAL_COMMUNITY): Payer: Self-pay | Admitting: Cardiovascular Disease

## 2021-11-22 DIAGNOSIS — F17210 Nicotine dependence, cigarettes, uncomplicated: Secondary | ICD-10-CM | POA: Diagnosis not present

## 2021-11-22 DIAGNOSIS — I350 Nonrheumatic aortic (valve) stenosis: Secondary | ICD-10-CM

## 2021-11-22 DIAGNOSIS — D6859 Other primary thrombophilia: Secondary | ICD-10-CM | POA: Insufficient documentation

## 2021-11-22 DIAGNOSIS — I2510 Atherosclerotic heart disease of native coronary artery without angina pectoris: Secondary | ICD-10-CM | POA: Insufficient documentation

## 2021-11-22 DIAGNOSIS — I272 Pulmonary hypertension, unspecified: Secondary | ICD-10-CM | POA: Insufficient documentation

## 2021-11-22 DIAGNOSIS — I080 Rheumatic disorders of both mitral and aortic valves: Secondary | ICD-10-CM | POA: Insufficient documentation

## 2021-11-22 DIAGNOSIS — Z79899 Other long term (current) drug therapy: Secondary | ICD-10-CM | POA: Diagnosis not present

## 2021-11-22 DIAGNOSIS — Z7901 Long term (current) use of anticoagulants: Secondary | ICD-10-CM | POA: Diagnosis not present

## 2021-11-22 DIAGNOSIS — Z79890 Hormone replacement therapy (postmenopausal): Secondary | ICD-10-CM | POA: Insufficient documentation

## 2021-11-22 DIAGNOSIS — I4819 Other persistent atrial fibrillation: Secondary | ICD-10-CM | POA: Diagnosis not present

## 2021-11-22 DIAGNOSIS — J449 Chronic obstructive pulmonary disease, unspecified: Secondary | ICD-10-CM | POA: Insufficient documentation

## 2021-11-22 DIAGNOSIS — E785 Hyperlipidemia, unspecified: Secondary | ICD-10-CM | POA: Insufficient documentation

## 2021-11-22 DIAGNOSIS — I10 Essential (primary) hypertension: Secondary | ICD-10-CM | POA: Insufficient documentation

## 2021-11-22 DIAGNOSIS — R69 Illness, unspecified: Secondary | ICD-10-CM | POA: Diagnosis not present

## 2021-11-22 HISTORY — PX: RIGHT/LEFT HEART CATH AND CORONARY ANGIOGRAPHY: CATH118266

## 2021-11-22 LAB — POCT I-STAT 7, (LYTES, BLD GAS, ICA,H+H)
Acid-Base Excess: 0 mmol/L (ref 0.0–2.0)
Bicarbonate: 25.9 mmol/L (ref 20.0–28.0)
Calcium, Ion: 1.16 mmol/L (ref 1.15–1.40)
HCT: 38 % (ref 36.0–46.0)
Hemoglobin: 12.9 g/dL (ref 12.0–15.0)
O2 Saturation: 93 %
Potassium: 3.4 mmol/L — ABNORMAL LOW (ref 3.5–5.1)
Sodium: 136 mmol/L (ref 135–145)
TCO2: 27 mmol/L (ref 22–32)
pCO2 arterial: 44.9 mmHg (ref 32–48)
pH, Arterial: 7.369 (ref 7.35–7.45)
pO2, Arterial: 70 mmHg — ABNORMAL LOW (ref 83–108)

## 2021-11-22 LAB — POCT I-STAT EG7
Acid-Base Excess: 2 mmol/L (ref 0.0–2.0)
Bicarbonate: 27.8 mmol/L (ref 20.0–28.0)
Calcium, Ion: 1.18 mmol/L (ref 1.15–1.40)
HCT: 38 % (ref 36.0–46.0)
Hemoglobin: 12.9 g/dL (ref 12.0–15.0)
O2 Saturation: 59 %
Potassium: 3.4 mmol/L — ABNORMAL LOW (ref 3.5–5.1)
Sodium: 142 mmol/L (ref 135–145)
TCO2: 29 mmol/L (ref 22–32)
pCO2, Ven: 46.8 mmHg (ref 44–60)
pH, Ven: 7.382 (ref 7.25–7.43)
pO2, Ven: 32 mmHg (ref 32–45)

## 2021-11-22 SURGERY — RIGHT/LEFT HEART CATH AND CORONARY ANGIOGRAPHY
Anesthesia: LOCAL

## 2021-11-22 MED ORDER — HEPARIN (PORCINE) IN NACL 1000-0.9 UT/500ML-% IV SOLN
INTRAVENOUS | Status: AC
  Filled 2021-11-22: qty 500

## 2021-11-22 MED ORDER — SODIUM CHLORIDE 0.9 % IV SOLN: 250.0000 mL | INTRAVENOUS | Status: DC | PRN

## 2021-11-22 MED ORDER — MIDAZOLAM HCL 2 MG/2ML IJ SOLN
INTRAMUSCULAR | Status: AC
  Filled 2021-11-22: qty 2

## 2021-11-22 MED ORDER — SODIUM CHLORIDE 0.9% FLUSH: 3.0000 mL | INTRAVENOUS | Status: DC | PRN

## 2021-11-22 MED ORDER — HEPARIN (PORCINE) IN NACL 1000-0.9 UT/500ML-% IV SOLN
INTRAVENOUS | Status: DC | PRN
  Administered 2021-11-22 (×2): 500 mL

## 2021-11-22 MED ORDER — HEPARIN SODIUM (PORCINE) 1000 UNIT/ML IJ SOLN
INTRAMUSCULAR | Status: DC | PRN
  Administered 2021-11-22: 3000 [IU] via INTRAVENOUS

## 2021-11-22 MED ORDER — HEPARIN SODIUM (PORCINE) 1000 UNIT/ML IJ SOLN
INTRAMUSCULAR | Status: AC
  Filled 2021-11-22: qty 10

## 2021-11-22 MED ORDER — ACETAMINOPHEN 325 MG PO TABS: 650.0000 mg | ORAL_TABLET | ORAL | Status: DC | PRN

## 2021-11-22 MED ORDER — SODIUM CHLORIDE 0.9 % WEIGHT BASED INFUSION: 1.0000 mL/kg/h | INTRAVENOUS | Status: DC

## 2021-11-22 MED ORDER — VERAPAMIL HCL 2.5 MG/ML IV SOLN
INTRAVENOUS | Status: DC | PRN
  Administered 2021-11-22: 10 mL via INTRA_ARTERIAL

## 2021-11-22 MED ORDER — ONDANSETRON HCL 4 MG/2ML IJ SOLN: 4.0000 mg | Freq: Four times a day (QID) | INTRAMUSCULAR | Status: DC | PRN

## 2021-11-22 MED ORDER — FENTANYL CITRATE (PF) 100 MCG/2ML IJ SOLN
INTRAMUSCULAR | Status: AC
  Filled 2021-11-22: qty 2

## 2021-11-22 MED ORDER — VERAPAMIL HCL 2.5 MG/ML IV SOLN
INTRAVENOUS | Status: AC
  Filled 2021-11-22: qty 2

## 2021-11-22 MED ORDER — MIDAZOLAM HCL 2 MG/2ML IJ SOLN
INTRAMUSCULAR | Status: DC | PRN
  Administered 2021-11-22: 1 mg via INTRAVENOUS

## 2021-11-22 MED ORDER — LABETALOL HCL 5 MG/ML IV SOLN: 10.0000 mg | INTRAVENOUS | Status: DC | PRN

## 2021-11-22 MED ORDER — SODIUM CHLORIDE 0.9 % IV SOLN: INTRAVENOUS | Status: DC

## 2021-11-22 MED ORDER — SODIUM CHLORIDE 0.9% FLUSH: 3.0000 mL | Freq: Two times a day (BID) | INTRAVENOUS | Status: DC

## 2021-11-22 MED ORDER — IOHEXOL 350 MG/ML SOLN
INTRAVENOUS | Status: DC | PRN
  Administered 2021-11-22: 30 mL

## 2021-11-22 MED ORDER — LIDOCAINE HCL (PF) 1 % IJ SOLN
INTRAMUSCULAR | Status: AC
  Filled 2021-11-22: qty 30

## 2021-11-22 MED ORDER — ASPIRIN 81 MG PO CHEW: 81.0000 mg | CHEWABLE_TABLET | ORAL | Status: DC

## 2021-11-22 MED ORDER — HYDRALAZINE HCL 20 MG/ML IJ SOLN: 10.0000 mg | INTRAMUSCULAR | Status: DC | PRN

## 2021-11-22 MED ORDER — FENTANYL CITRATE (PF) 100 MCG/2ML IJ SOLN
INTRAMUSCULAR | Status: DC | PRN
Start: 2021-11-22 — End: 2021-11-22
  Administered 2021-11-22: 25 ug via INTRAVENOUS

## 2021-11-22 MED ORDER — LIDOCAINE HCL (PF) 1 % IJ SOLN
INTRAMUSCULAR | Status: DC | PRN
  Administered 2021-11-22: 2 mL
  Administered 2021-11-22: 1 mL

## 2021-11-22 SURGICAL SUPPLY — 14 items
BAND CMPR LRG ZPHR (HEMOSTASIS) ×1
BAND ZEPHYR COMPRESS 30 LONG (HEMOSTASIS) ×1 IMPLANT
CATH BALLN WEDGE 5F 110CM (CATHETERS) ×1 IMPLANT
CATH INFINITI 5FR MULTPACK ANG (CATHETERS) ×1 IMPLANT
GLIDESHEATH SLEND SS 6F .021 (SHEATH) ×1 IMPLANT
GUIDEWIRE .025 260CM (WIRE) ×1 IMPLANT
GUIDEWIRE INQWIRE 1.5J.035X260 (WIRE) IMPLANT
INQWIRE 1.5J .035X260CM (WIRE) ×2
KIT HEART LEFT (KITS) ×2 IMPLANT
PACK CARDIAC CATHETERIZATION (CUSTOM PROCEDURE TRAY) ×2 IMPLANT
SHEATH GLIDE SLENDER 4/5FR (SHEATH) ×1 IMPLANT
TRANSDUCER W/STOPCOCK (MISCELLANEOUS) ×2 IMPLANT
TUBING CIL FLEX 10 FLL-RA (TUBING) ×2 IMPLANT
WIRE EMERALD 3MM-J .025X260CM (WIRE) ×1 IMPLANT

## 2021-11-22 NOTE — Interval H&P Note (Signed)
History and Physical Interval Note: ? ?11/22/2021 ?8:48 AM ? ?Tammie Martin  has presented today for surgery, with the diagnosis of severe aortic stenosis.  The various methods of treatment have been discussed with the patient and family. After consideration of risks, benefits and other options for treatment, the patient has consented to  Procedure(s): ?RIGHT/LEFT HEART CATH AND CORONARY ANGIOGRAPHY (N/A) as a surgical intervention.  The patient's history has been reviewed, patient examined, no change in status, stable for surgery.  I have reviewed the patient's chart and labs.  Questions were answered to the patient's satisfaction.   ? ? ?Sherren Mocha ? ? ?

## 2021-11-29 ENCOUNTER — Other Ambulatory Visit: Payer: Self-pay | Admitting: Family Medicine

## 2021-11-29 DIAGNOSIS — E034 Atrophy of thyroid (acquired): Secondary | ICD-10-CM

## 2021-11-30 ENCOUNTER — Encounter: Payer: Self-pay | Admitting: Physician Assistant

## 2021-11-30 ENCOUNTER — Ambulatory Visit: Payer: Medicare HMO | Admitting: Cardiovascular Disease

## 2021-11-30 ENCOUNTER — Encounter: Payer: Self-pay | Admitting: Cardiovascular Disease

## 2021-11-30 ENCOUNTER — Other Ambulatory Visit: Payer: Self-pay | Admitting: Physician Assistant

## 2021-11-30 VITALS — BP 120/70 | HR 81 | Ht 63.0 in | Wt 121.6 lb

## 2021-11-30 DIAGNOSIS — I5032 Chronic diastolic (congestive) heart failure: Secondary | ICD-10-CM | POA: Diagnosis not present

## 2021-11-30 DIAGNOSIS — I350 Nonrheumatic aortic (valve) stenosis: Secondary | ICD-10-CM

## 2021-11-30 DIAGNOSIS — I4819 Other persistent atrial fibrillation: Secondary | ICD-10-CM | POA: Diagnosis not present

## 2021-11-30 NOTE — Progress Notes (Addendum)
Pre Surgical Assessment: 5 M Walk Test  29M=16.9f  5 Meter Walk Test- trial 1: 4.21 seconds 5 Meter Walk Test- trial 2: 4.31 seconds 5 Meter Walk Test- trial 3: 4.38 seconds 5 Meter Walk Test Average: 4.3 seconds   STS score Procedure: Isolated AVR Risk of Mortality: 2.787% Renal Failure: 1.108% Permanent Stroke: 1.620% Prolonged Ventilation: 7.339% DSW Infection: 0.066% Reoperation: 3.142% Morbidity or Mortality: 11.348% Short Length of Stay: 41.256% Long Length of Stay: 5.279%

## 2021-11-30 NOTE — Progress Notes (Signed)
?Cardiology Office Note:   ? ?Date:  11/30/2021  ? ?ID:  Tammie Martin, DOB 1951/05/19, MRN [id number] ? ?PCP:  Janora Norlander, DO ?  ?[facility] Providers ?Cardiologist:  Carlyle Dolly, MD    ? ?Referring MD: Janora Norlander, DO  ? ?Chief Complaint  ?Patient presents with  ? Shortness of Breath  ? ? ?History of Present Illness:   ? ?Tammie Martin is a 71 y.o. female referred by Dr. [name] for evaluation of aortic stenosis. ? ?The patient is here alone today. She was first diagnosed with a heart murmur in 2017 and was found to have mild AS initially.  She underwent a recent echo in February 2023 when she was found to have atrial fibrillation.  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  The patient underwent elective cardioversion for treatment of her atrial fibrillation, remained in sinus rhythm for about a week, then atrial febrile current.  A decision has been made to treat her medically with a rate control/anticoagulation strategy. ? ?The patient is a retired Therapist, sports. She worked in [place], [place] and around [place], [place]. She is widowed x 20 years - her husband died at 71 years of age. She now reports progressive dyspnea on exertion. She has to stop and rest because of DOW within a short distance of less than one block. She hasn't had any recent chest pressure but has experienced this in the past that she describes as a burning sensation. She denies recent lightheadedness or syncope. The patient is a longtime smoker, has tried to quit several times but without success. She currently smokes 1 ppd. She has partial dentures and has 6 remaining teeth, needing to see her dentist for follow-up. She has macular degeneration and doesn't drive at night. 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  ? ?Past Medical  History:  ?Diagnosis Date  ? Depression   ? DJD (degenerative joint disease)   ? Hyperlipidemia   ? Hypertension   ? Thyroid disease   ? ? ?Past Surgical History:  ?Procedure Laterality Date  ? ABDOMINAL HYSTERECTOMY    ? APPENDECTOMY    ? BREAST ENHANCEMENT SURGERY    ? CARDIOVERSION N/A 10/27/2021  ? Procedure: CARDIOVERSION;  Surgeon: Arnoldo Lenis, MD;  Location: [facility];  Service: Endoscopy;  Laterality: N/A;  ? RIGHT/LEFT HEART CATH AND CORONARY ANGIOGRAPHY N/A 11/22/2021  ? Procedure: RIGHT/LEFT HEART CATH AND CORONARY ANGIOGRAPHY;  Surgeon: Sherren Mocha, MD;  Location: [facility];  Service: Cardiovascular;  Laterality: N/A;  ? TONSILLECTOMY    ? ? ?Current Medications: ?Current Meds  ?Medication Sig  ? albuterol (VENTOLIN HFA) 108 (90 Base) MCG/ACT inhaler Inhale 2 puffs into the lungs every 6 (six) hours as needed for wheezing or shortness of breath.  ? atorvastatin (LIPITOR) 20 MG tablet Take 1 tablet (20 mg total) by mouth daily.  ? bisoprolol (ZEBETA) 10 MG tablet Take 1.5 tablets (15 mg total) by mouth daily.  ? calcium gluconate 500 MG tablet Take 1 tablet by mouth daily.   ? cetirizine (ZYRTEC) 10 MG tablet Take 10 mg by mouth daily.  ? cholecalciferol (VITAMIN D) 1000 units tablet Take 2,000 Units by mouth daily.  ? desvenlafaxine (PRISTIQ) 50 MG 24 hr tablet Take 1 tablet (50 mg total) by mouth daily.  ? ELIQUIS 5 MG TABS tablet Take 1  tablet (5 mg total) by mouth 2 (two) times daily.  ? fluticasone (FLONASE) 50 MCG/ACT nasal spray Place 2 sprays into both nostrils daily as needed for allergies or rhinitis.  ? furosemide (LASIX) 20 MG tablet Take 1 tablet (20 mg total) by mouth daily.  ? Glucosamine-Chondroitin (MOVE FREE PO) Take 1 tablet by mouth daily.  ? levothyroxine (SYNTHROID) 88 MCG tablet Take 1 tablet by mouth daily.  ? Multiple Vitamins-Minerals (ALIVE WOMENS 50+ PO) Take 1 tablet by mouth daily.  ? Multiple Vitamins-Minerals (PRESERVISION AREDS 2 PO) Take 1 capsule by mouth  in the morning and at bedtime.  ? Omega-3 Fatty Acids (FISH OIL) 1200 MG CAPS Take 1,200 mg by mouth daily.  ? Probiotic Product (PROBIOTIC ADVANCED PO) Take 1 capsule by mouth daily.  ? tobramycin (TOBREX) 0.3 % ophthalmic solution Place 1 drop into the right eye See admin instructions. Instill 1 drop into the right eye three times daily, the day before, day of and day after eye injections  ? traZODone (DESYREL) 50 MG tablet Take 0.5-1 tablets (25-50 mg total) by mouth at bedtime as needed for sleep. (Patient taking differently: Take 50 mg by mouth at bedtime as needed for sleep.)  ? TURMERIC PO Take 1 capsule by mouth daily.  ? umeclidinium-vilanterol (ANORO ELLIPTA) 62.5-25 MCG/ACT AEPB Inhale 1 puff into the lungs daily at 6 (six) AM.  ? ?Current Facility-Administered Medications for the 11/30/21 encounter (Office Visit) with Sherren Mocha, MD  ?Medication  ? sodium chloride flush (NS) 0.9 % injection 3 mL  ?  ? ?Allergies:   Penicillins, Singulair [montelukast sodium], Codeine, and Other  ? ?Social History  ? ?Socioeconomic History  ? Marital status: Widowed  ?  Spouse name: Not on file  ? Number of children: 2  ? Years of education: 76  ? Highest education level: Associate degree: academic program  ?Occupational History  ? Occupation: Retired  ?  Comment: Registered Nurse  ?Tobacco Use  ? Smoking status: Every Day  ?  Packs/day: 1.00  ?  Years: 40.00  ?  Pack years: 40.00  ?  Types: Cigarettes  ? Smokeless tobacco: Never  ?Vaping Use  ? Vaping Use: Never used  ?Substance and Sexual Activity  ? Alcohol use: No  ? Drug use: No  ? Sexual activity: Not Currently  ?Other Topics Concern  ? Not on file  ?Social History Narrative  ? Lives alone - one cat, one dog  ? ?Social Determinants of Health  ? ?Financial Resource Strain: Low Risk   ? Difficulty of Paying Living Expenses: Not hard at all  ?Food Insecurity: No Food Insecurity  ? Worried About Charity fundraiser in the Last Year: Never true  ? Ran Out of Food in  the Last Year: Never true  ?Transportation Needs: No Transportation Needs  ? Lack of Transportation (Medical): No  ? Lack of Transportation (Non-Medical): No  ?Physical Activity: Inactive  ? Days of Exercise per Week: 0 days  ? Minutes of Exercise per Session: 0 min  ?Stress: No Stress Concern Present  ? Feeling of Stress : Not at all  ?Social Connections: Moderately Isolated  ? Frequency of Communication with Friends and Family: More than three times a week  ? Frequency of Social Gatherings with Friends and Family: More than three times a week  ? Attends Religious Services: Never  ? Active Member of Clubs or Organizations: Yes  ? Attends Archivist Meetings: More than 4 times per year  ?  Marital Status: Widowed  ?  ? ?Family History: ?The patient's family history includes Alcohol abuse in her brother; Anxiety disorder in her daughter; Arthritis in her daughter; Cancer in her maternal aunt and mother; Cirrhosis in her brother; Depression in her brother, daughter, sister, and sister; Diabetes in her daughter and sister; Fibromyalgia in her daughter; Heart attack in her father; Kidney disease in her mother; Liver disease in her brother; Obesity in her sister and sister. ? ?ROS:   ?Please see the history of present illness.    ?All other systems reviewed and are negative. ? ?EKGs/Labs/Other Studies Reviewed:   ? ?The following studies were reviewed today: ?Cardiac Cath: ?1.  Patent left main with no significant stenosis. ?2.  Patent LAD with mild calcific 40% stenosis just beyond the first diagonal/first septal perforator ?3.  Patent left circumflex with mild nonobstructive 40% stenosis ?4.  Patent right coronary artery mild to moderate 40 to 50% stenosis in the proximal and mid vessel ?5.  Heavy mitral annular calcification ?6.  Calcified, restricted aortic valve with peak to peak gradient of 33 mmHg, mean gradient 31 mmHg, calculated aortic valve area 0.7 cm?, findings consistent with severe aortic  stenosis ?7.  Mild pulmonary hypertension with PA pressure 42/25 mean 33 mmHg ? ?Diagnostic ?Dominance: Right ?Left Anterior Descending  ?Mid LAD lesion is 40% stenosed. The lesion is calcified.  ?  ?Left Circumflex

## 2021-11-30 NOTE — Patient Instructions (Signed)
Medication Instructions:  ?Your physician recommends that you continue on your current medications as directed. Please refer to the Current Medication list given to you today. ? ?*If you need a refill on your cardiac medications before your next appointment, please call your pharmacy* ? ? ?Lab Work: ?NONE ?If you have labs (blood work) drawn today and your tests are completely normal, you will receive your results only by: ?MyChart Message (if you have MyChart) OR ?A paper copy in the mail ?If you have any lab test that is abnormal or we need to change your treatment, we will call you to review the results. ? ? ?Testing/Procedures: ?Proceed with TAVR Work-up ? ? ?Follow-Up: ?TAVR Ct's on 12/16/21 at 10:30am ?Dr Cyndia Bent referral on 6/7 '@3pm'$  ? ? ?Provider:   ?Sherren Mocha, MD ? ?  ?

## 2021-12-02 ENCOUNTER — Ambulatory Visit: Payer: PPO

## 2021-12-06 ENCOUNTER — Other Ambulatory Visit: Payer: Self-pay | Admitting: Family Medicine

## 2021-12-06 DIAGNOSIS — I509 Heart failure, unspecified: Secondary | ICD-10-CM

## 2021-12-06 DIAGNOSIS — E785 Hyperlipidemia, unspecified: Secondary | ICD-10-CM

## 2021-12-16 ENCOUNTER — Ambulatory Visit (HOSPITAL_COMMUNITY): Payer: Medicare HMO

## 2021-12-16 ENCOUNTER — Ambulatory Visit (HOSPITAL_COMMUNITY)
Admission: RE | Admit: 2021-12-16 | Discharge: 2021-12-16 | Disposition: A | Discharge status: Home / Self Care | Payer: Medicare HMO | Source: Ambulatory Visit | Attending: Physician Assistant | Admitting: Physician Assistant

## 2021-12-16 ENCOUNTER — Encounter (HOSPITAL_COMMUNITY): Payer: Self-pay

## 2021-12-16 DIAGNOSIS — I2510 Atherosclerotic heart disease of native coronary artery without angina pectoris: Secondary | ICD-10-CM | POA: Diagnosis not present

## 2021-12-16 DIAGNOSIS — I700 Atherosclerosis of aorta: Secondary | ICD-10-CM | POA: Diagnosis not present

## 2021-12-16 DIAGNOSIS — I350 Nonrheumatic aortic (valve) stenosis: Secondary | ICD-10-CM | POA: Insufficient documentation

## 2021-12-16 DIAGNOSIS — I7143 Infrarenal abdominal aortic aneurysm, without rupture: Secondary | ICD-10-CM | POA: Diagnosis not present

## 2021-12-16 IMAGING — CT CT HEART MORP W/ CTA COR W/ SCORE W/ CA W/CM &/OR W/O CM
2 of 8 series · 10 of 20 positions shown, 12 images · IV contrast (APPLIED)
Comparison: None.
COMPARISON: None.

Addendum:
EXAM:
OVER-READ INTERPRETATION  CT CHEST

The following report is an over-read performed by radiologist Dr.
MULATILLO [REDACTED] on [DATE]. This
over-read does not include interpretation of cardiac or coronary
anatomy or pathology. The coronary calcium score/coronary CTA
interpretation by the cardiologist is attached.
CLINICAL DATA: Severe Aortic Stenosis.
Cardiac TAVR CT
TECHNIQUE: A non-contrast, gated CT scan was obtained with axial slices of 3 mm
through the heart for aortic valve calcium scoring. A 120 kV
retrospective, gated, contrast cardiac scan was obtained. Gantry
rotation speed was 250 msecs and collimation was 0.6 mm.
Nitroglycerin was not given. The 3D data set was reconstructed in 5%
intervals of the 0-95% of the R-R cycle. Systolic and diastolic
phases were analyzed on a dedicated workstation using MPR, MIP, and
VRT modes. The patient received 100 cc of contrast.

[Series 13: 0-90% · axial · 0.39mm/px · z∈[+1330,+1462]mm · 5 of 3300 slices shown, 7 images]
[im 550/3300  vessel]
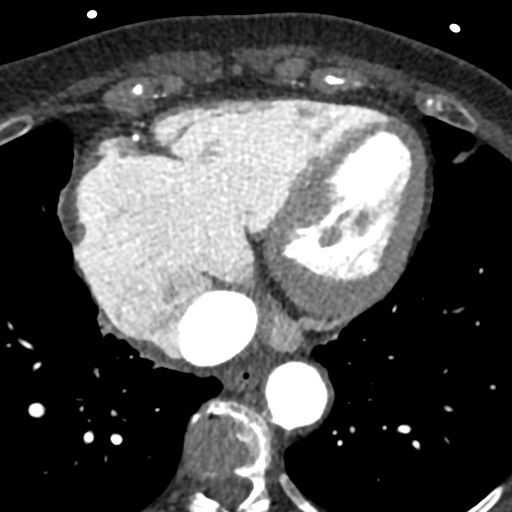
[im 550/3300  lung]
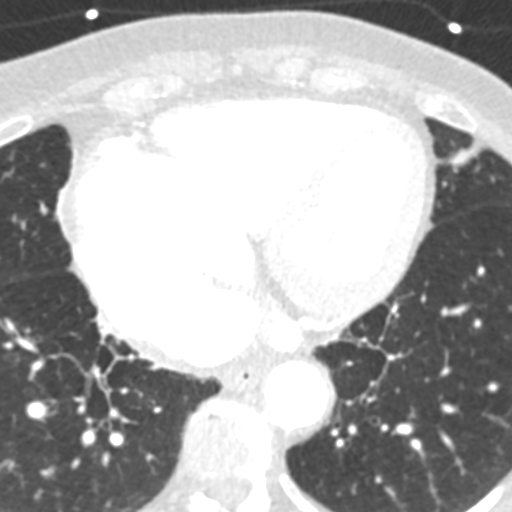
[im 1100/3300  vessel]
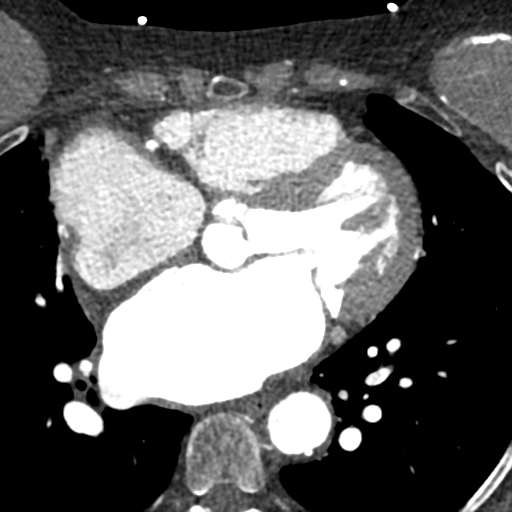
[im 1650/3300  vessel]
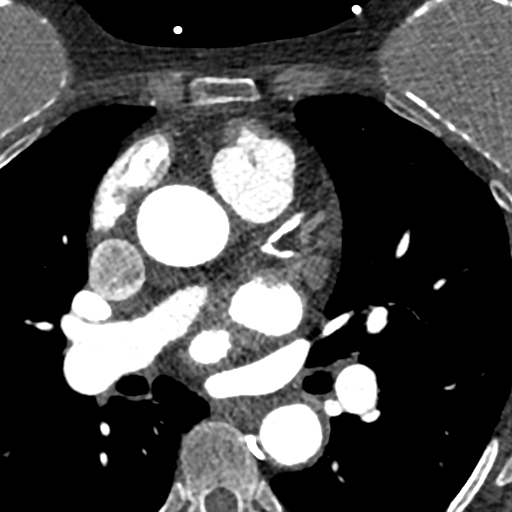
[im 2200/3300  vessel]
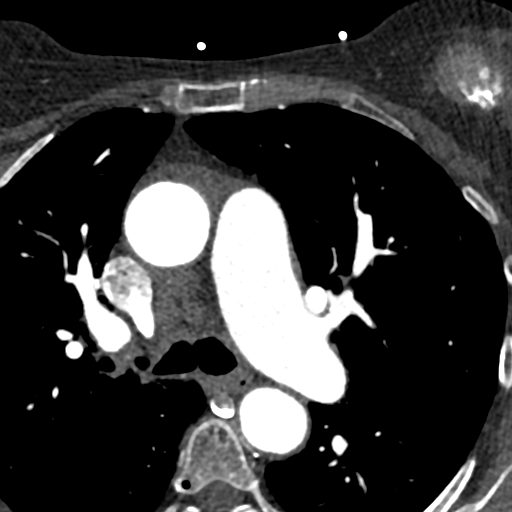
[im 2750/3300  vessel]
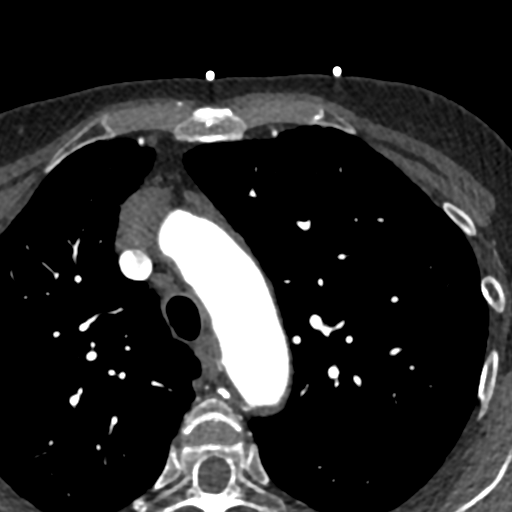
[im 2750/3300  lung]
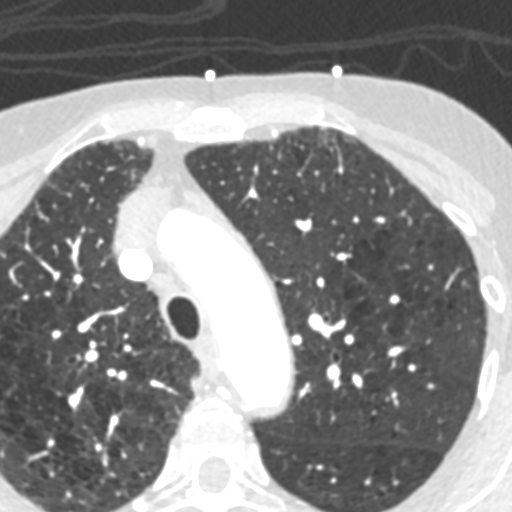

[Series 14: 5-95% · axial · 0.39mm/px · z∈[+1330,+1462]mm · 5 of 3300 slices shown]
[im 550/3300  vessel]
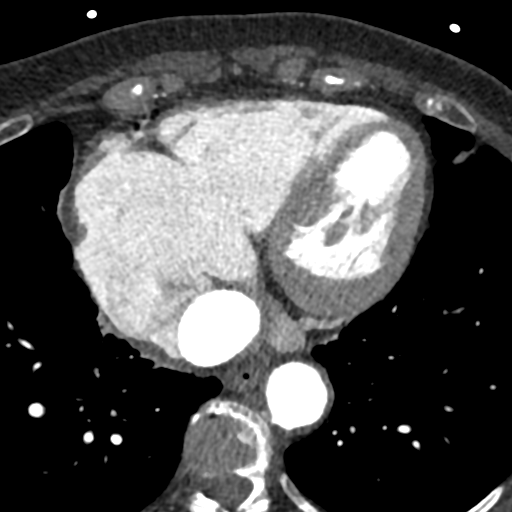
[im 1100/3300  vessel]
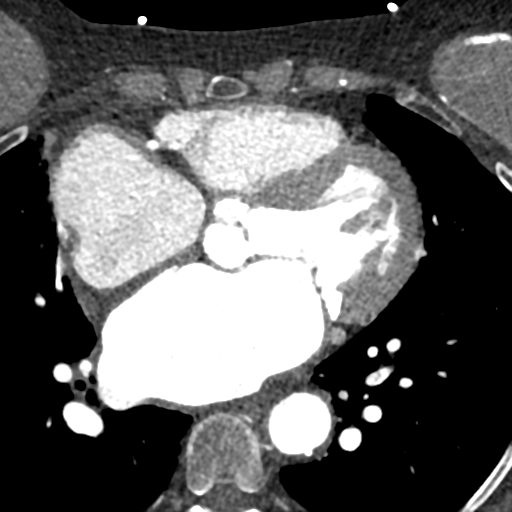
[im 1650/3300  vessel]
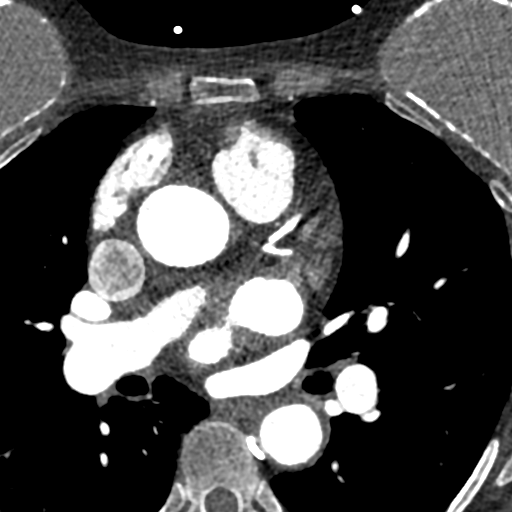
[im 2200/3300  vessel]
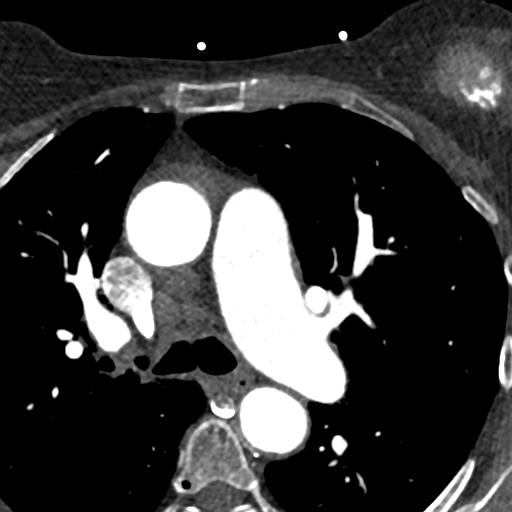
[im 2750/3300  vessel]
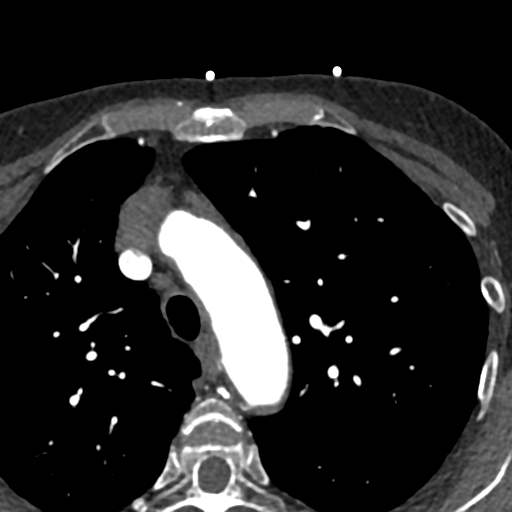

[10 of 20 positions shown; findings below may reference images not displayed]

FINDINGS: Extracardiac findings will be described separately under dictation
for contemporaneously obtained CTA chest, abdomen and pelvis.
IMPRESSION: Please see separate dictation for contemporaneously obtained CTA
chest, abdomen and pelvis dated [DATE] for full description of
relevant extracardiac findings.
FINDINGS: Image quality: Excellent.

Noise artifact is: Limited.

Valve Morphology: Tricuspid aortic valve with diffuse severe
calcifications. Restricted movement in systole.

Aortic Valve Calcium score: [XN]

Aortic annular dimension:

Phase assessed: 25%

Annular area: 411 mm2

Annular perimeter: 73.5 mm

Max diameter: 26.5 mm

Min diameter: 20.2 mm

Annular and subannular calcification: Trace calcification under the
NCC that extends into LVOT.

Membranous septum length: 9.5 mm

Optimal coplanar projection: RAO 10 MULATILLO 16

Coronary Artery Height above Annulus:

Left Main: 11.7 mm

Right Coronary: 20.1 mm

Sinus of Valsalva Measurements:

Non-coronary: 32 mm

Right-coronary: 32 mm

Left-coronary: 33 mm

Sinus of Valsalva Height:

Non-coronary: 22.7 mm

Right-coronary: 23.7 mm

Left-coronary: 21.1 mm

Sinotubular Junction: 29 mm

Ascending Thoracic Aorta: 34 mm

Coronary Arteries: Normal coronary origin. Right dominance. The
study was performed without use of NTG and is insufficient for
plaque evaluation. Please refer to recent cardiac catheterization
for coronary assessment. 3-vessel coronary calcifications.

Cardiac Morphology:

Right Atrium: Right atrial size is dilated.

Right Ventricle: The right ventricular cavity is dilated.

Left Atrium: Left atrial size is normal in size. Small PFO. Contrast
mixing artifact in the MULATILLO, but no thrombus on delayed imaging.

Left Ventricle: The ventricular cavity size is within normal limits.
There are no stigmata of prior infarction. There is no abnormal
filling defect.

Pulmonary arteries: Dilated, suggestive of pulmonary hypertension.
No proximal filling defect.

Pulmonary veins: Normal pulmonary venous drainage.

Pericardium: Normal thickness with no significant effusion or
calcium present.

Mitral Valve: The mitral valve is degenerative with severe annular
calcium.

Extra-cardiac findings: See attached radiology report for
non-cardiac structures.
IMPRESSION: 1. Tricuspid aortic valve with severe aortic stenosis.

2. Annular measurements support a 23 mm S3 (411 mm2).

3. Trace calcification under the NCC that extends into LVOT.

4. Sufficient coronary to annulus distance.

5. Optimal Fluoroscopic Angle for Delivery: RAO 10 MULATILLO 16

*** End of Addendum ***
EXAM:
OVER-READ INTERPRETATION  CT CHEST

The following report is an over-read performed by radiologist Dr.
MULATILLO [REDACTED] on [DATE]. This
over-read does not include interpretation of cardiac or coronary
anatomy or pathology. The coronary calcium score/coronary CTA
interpretation by the cardiologist is attached.
FINDINGS: Extracardiac findings will be described separately under dictation
for contemporaneously obtained CTA chest, abdomen and pelvis.
IMPRESSION: Please see separate dictation for contemporaneously obtained CTA
chest, abdomen and pelvis dated [DATE] for full description of
relevant extracardiac findings.

## 2021-12-16 IMAGING — CT CT ANGIO CHEST
2 of 7 series · 16 of 36 positions shown · IV contrast (APPLIED)
Comparison: None.

CLINICAL DATA: Preop evaluation for TAVR

EXAM:
CT ANGIOGRAPHY CHEST, ABDOMEN AND PELVIS
TECHNIQUE: Multidetector CT imaging through the chest, abdomen and pelvis was
performed using the standard protocol during bolus administration of
intravenous contrast. Multiplanar reconstructed images and MIPs were
obtained and reviewed to evaluate the vascular anatomy.

[Series 3: ax thins · axial · 0.59mm/px · z∈[+986,+1540]mm · 15 of 623 slices shown]
[im 35/623  lung]
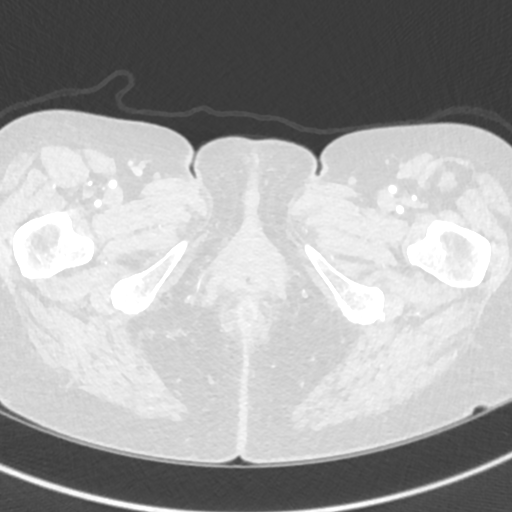
[im 70/623  mediastinal]
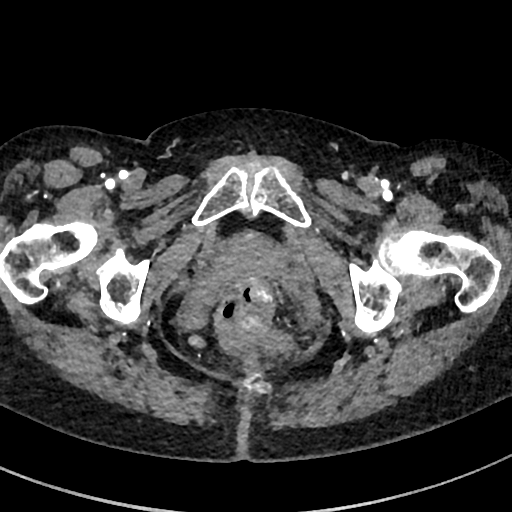
[im 104/623  lung]
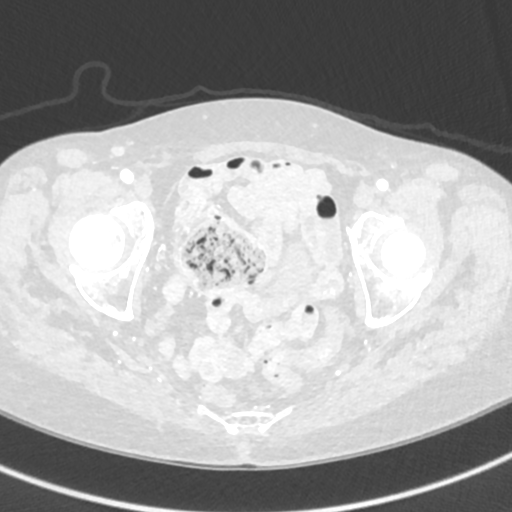
[im 139/623  mediastinal]
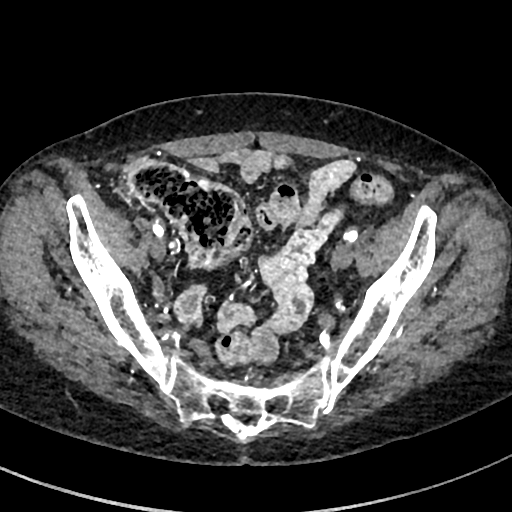
[im 208/623  lung]
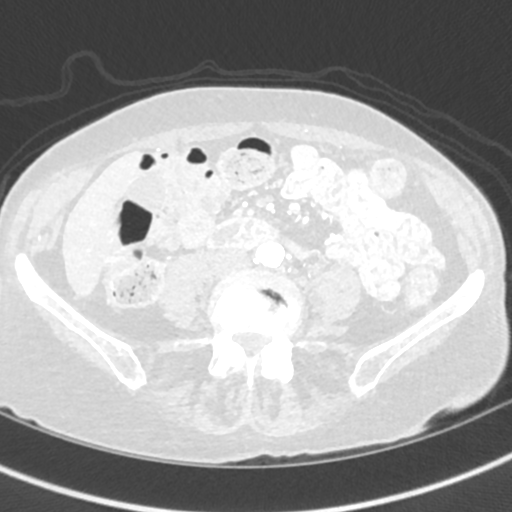
[im 242/623  mediastinal]
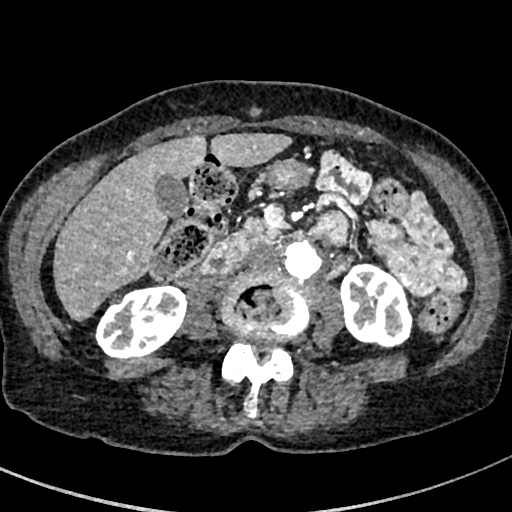
[im 277/623  lung]
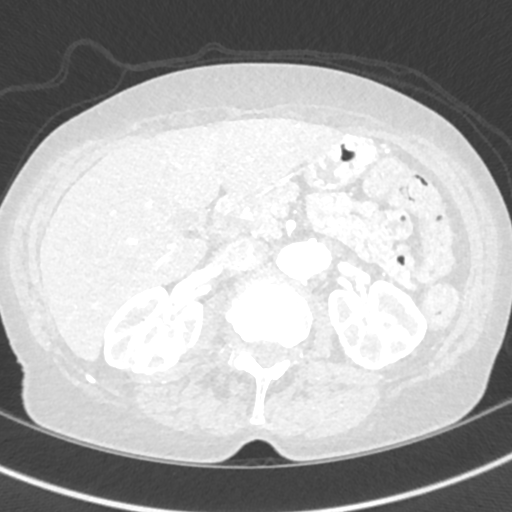
[im 312/623  mediastinal]
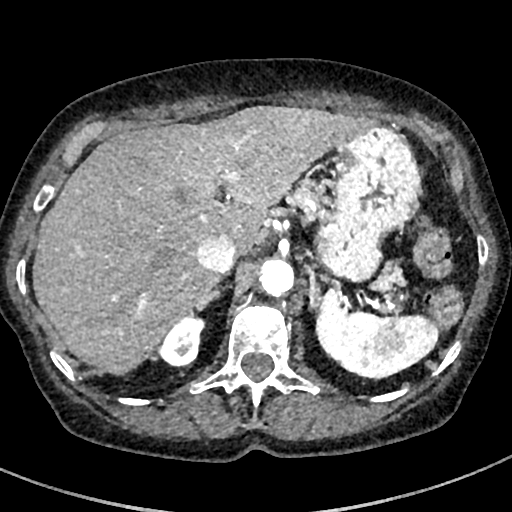
[im 346/623  lung]
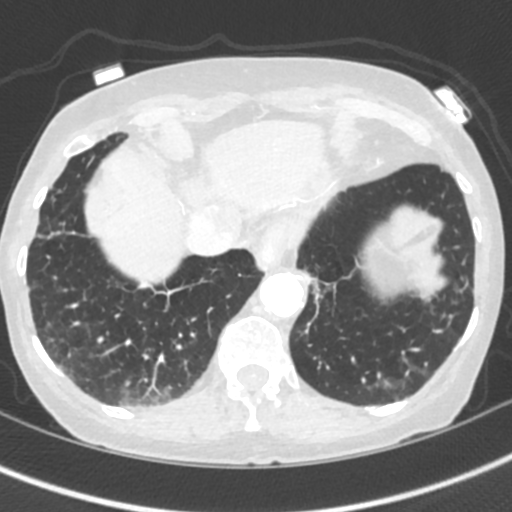
[im 381/623  mediastinal]
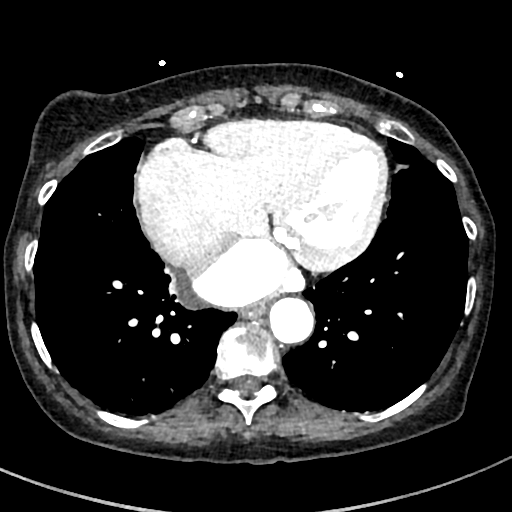
[im 415/623  lung]
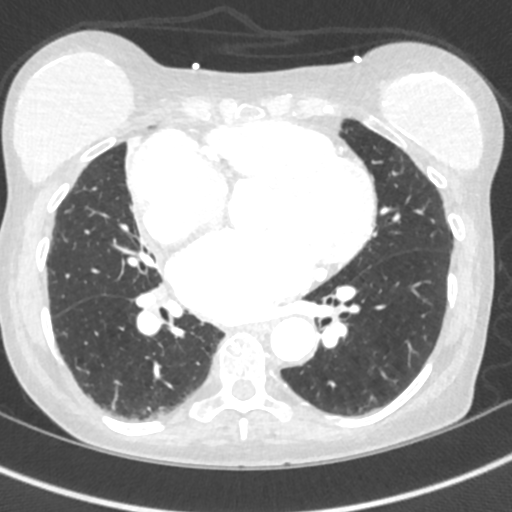
[im 484/623  mediastinal]
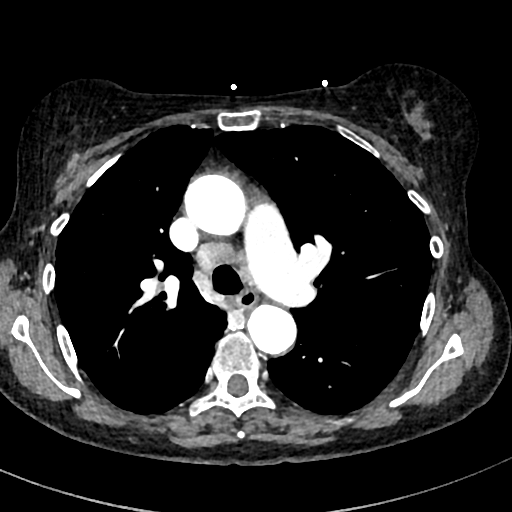
[im 519/623  lung]
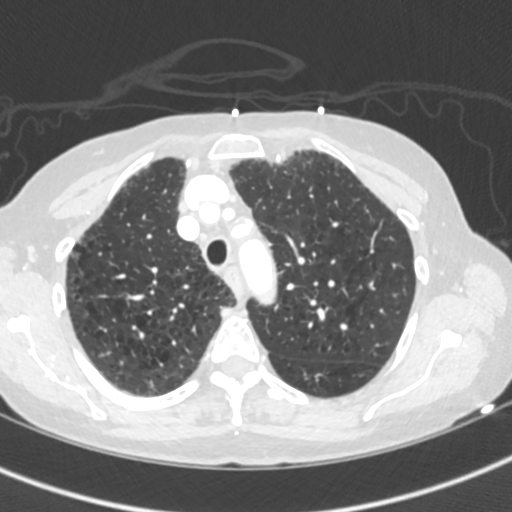
[im 553/623  mediastinal]
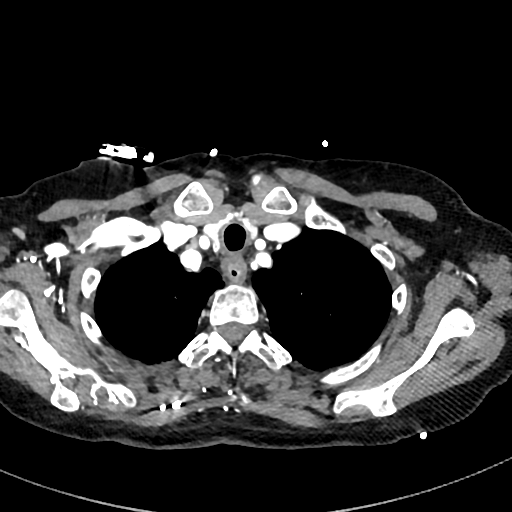
[im 588/623  lung]
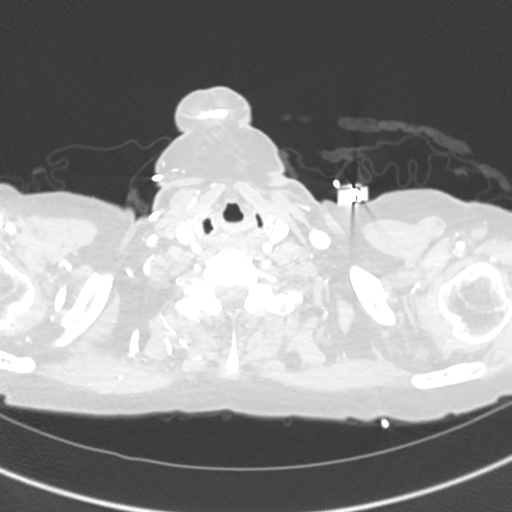

[Series 6: cor · coronal · 0.70mm/px · 1 of 112 slices shown]
[im 56/112  mediastinal]
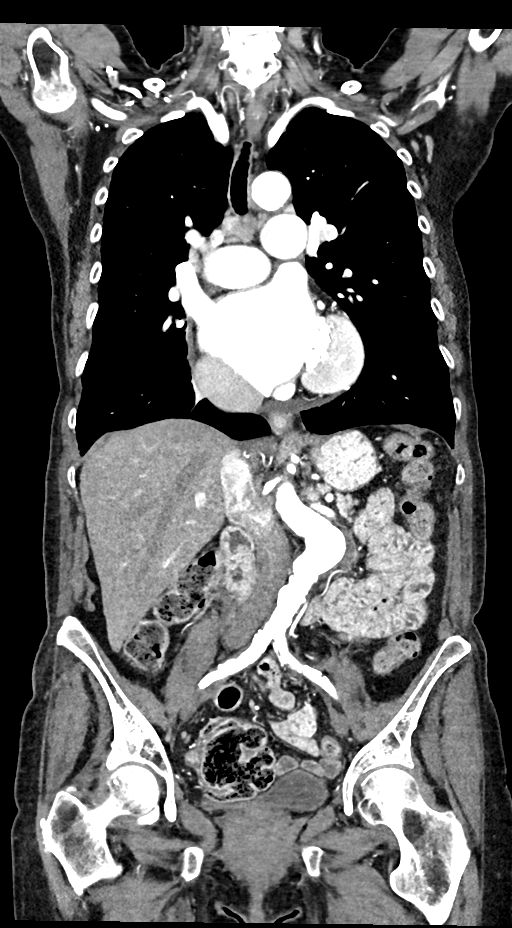

[16 of 36 positions shown; findings below may reference images not displayed]

RADIATION DOSE REDUCTION: This exam was performed according to the
departmental dose-optimization program which includes automated
exposure control, adjustment of the mA and/or kV according to
patient size and/or use of iterative reconstruction technique.

CONTRAST:  100mL OMNIPAQUE IOHEXOL 350 MG/ML SOLN
FINDINGS: CTA CHEST FINDINGS

Cardiovascular: Cardiomegaly. No pericardial effusion. Three-vessel
coronary artery calcifications. Aortic valve thickening and
calcifications. Mild atherosclerotic disease of the thoracic aorta.
Normal caliber thoracic aorta. Standard 3 vessel aortic arch with
moderate narrowing at the origin of the left subclavian artery due
to calcified and noncalcified plaque, otherwise no significant
stenosis.

Mediastinum/Nodes: Esophagus and thyroid are unremarkable. Enlarged
right lower paratracheal lymph node measuring up to 1.7 cm in short
axis.

Lungs/Pleura: Central airways are patent. Centrilobular emphysema.
Mild right basilar atelectasis. No consolidation, pleural effusion
or pneumothorax.

Musculoskeletal: No chest wall abnormality. No acute or significant
osseous findings.

CTA ABDOMEN AND PELVIS FINDINGS

Hepatobiliary: No focal liver abnormality is seen. No gallstones,
gallbladder wall thickening, or biliary dilatation.

Pancreas: Unremarkable. No pancreatic ductal dilatation or
surrounding inflammatory changes.

Spleen: Normal in size without focal abnormality.

Adrenals/Urinary Tract: Bilateral adrenal glands are unremarkable.
No hydronephrosis or nephrolithiasis. Bilateral wedge-shaped
hypodensities, likely due to scarring. Low-density lesion of the
upper pole of the left kidney, likely a simple cyst, no follow-up
imaging is recommended for this lesion.

Stomach/Bowel: Stomach is within normal limits. Appendix appears
normal. No evidence of bowel wall thickening, distention, or
inflammatory changes.

Vascular/lymphatic: Moderate atherosclerotic disease of the
abdominal aorta infrarenal abdominal aortic aneurysm measuring up to
2.6 cm. Moderate narrowing at the origins of the celiac artery and
right renal artery due to calcified and noncalcified plaque,
otherwise no significant stenosis of the major branch vessels. Focal
linear filling defect of the right common iliac artery seen on
series 3, image 443, likely chronic focal dissection. No
pathologically enlarged lymph nodes seen in the abdomen or pelvis.

Reproductive: No adnexal masses.

Other: No abdominal wall hernia or abnormality. No abdominopelvic
ascites.

Musculoskeletal: Moderate degenerative changes of the lumbar spine.
No acute or significant osseous findings.

VASCULAR MEASUREMENTS PERTINENT TO TAVR:

AORTA:

Minimal Aortic Diameter -  11.6 mm

Severity of Aortic Calcification-moderate

RIGHT PELVIS:

Right Common Iliac Artery -

Minimal [70] mm

Tortuosity-none

Calcification-moderate

Right External Iliac Artery -

Minimal [70] mm

Tortuosity-none

Calcification-moderate

Right Common Femoral Artery -

Minimal [70] mm

Tortuosity-none

Calcification-moderate

LEFT PELVIS:

Left Common Iliac Artery -

Minimal [70] mm

Tortuosity-mild

Calcification-severe

Left External Iliac Artery -

Minimal [70] mm

Tortuosity-none

Calcification-mild

Left Common Femoral Artery -

Minimal [70] mm

Tortuosity-none

Calcification-mild

Review of the MIP images confirms the above findings.
IMPRESSION: 1. Vascular findings and measurements pertinent to potential TAVR
procedure, as detailed above.
2. Severe thickening calcification of the aortic valve, compatible
with reported clinical history of severe aortic stenosis.
3. Moderate to severe aortoiliac atherosclerosis consisting of
calcified and noncalcified plaque. Focal dissection of the right
common iliac artery.
4. Infrarenal abdominal aortic aneurysm measuring up to 2.7 cm.
5. Cardiomegaly and three vessel coronary artery disease.
6. Enlarged right lower paratracheal lymph node measuring up to
cm in short axis, potentially reactive, although size is somewhat
greater than expected. Recommend follow-up chest CT in 3 months.

## 2021-12-16 MED ORDER — IOHEXOL 350 MG/ML SOLN
100.0000 mL | Freq: Once | INTRAVENOUS | Status: AC | PRN
  Administered 2021-12-16: 100 mL via INTRAVENOUS

## 2021-12-21 ENCOUNTER — Ambulatory Visit (INDEPENDENT_AMBULATORY_CARE_PROVIDER_SITE_OTHER): Payer: Medicare HMO

## 2021-12-21 ENCOUNTER — Encounter: Payer: Self-pay | Admitting: Cardiology

## 2021-12-21 ENCOUNTER — Encounter: Payer: Self-pay | Admitting: *Deleted

## 2021-12-21 ENCOUNTER — Other Ambulatory Visit: Payer: Self-pay | Admitting: Cardiology

## 2021-12-21 ENCOUNTER — Ambulatory Visit: Payer: Medicare HMO | Admitting: Cardiology

## 2021-12-21 VITALS — BP 124/86 | HR 65 | Ht 62.5 in | Wt 125.2 lb

## 2021-12-21 DIAGNOSIS — I272 Pulmonary hypertension, unspecified: Secondary | ICD-10-CM

## 2021-12-21 DIAGNOSIS — I2510 Atherosclerotic heart disease of native coronary artery without angina pectoris: Secondary | ICD-10-CM

## 2021-12-21 DIAGNOSIS — I4891 Unspecified atrial fibrillation: Secondary | ICD-10-CM

## 2021-12-21 DIAGNOSIS — I350 Nonrheumatic aortic (valve) stenosis: Secondary | ICD-10-CM | POA: Diagnosis not present

## 2021-12-21 DIAGNOSIS — I4819 Other persistent atrial fibrillation: Secondary | ICD-10-CM | POA: Diagnosis not present

## 2021-12-21 NOTE — Progress Notes (Signed)
? ? ? ?Clinical Summary ?Ms. Hulce is a 71 y.o.female seen today for follow up of the following medical problems.  ?  ?1.Afib ?- recently diagnosed by pcp 08/24/21, EKG review  does confirm afib ?- had 3 months of palpitations prior ?- she is on bisoprolol ?- ongonig symptoms of palpitations, occurs daily, lasts a few minutes. Can come on with exertion. Some associated SOB. ?  ?  ? 10/27/2021 DCCV. HRs 50s after, we lowered her bisoprol to '10mg'$  daily ?- reports recurrent palpitations about 1 week after DCCV, EKG showed back in afib ?- ongoing palpitaitons. DOE with activities though also being managed for severe AS.  ? ? ?  ?2.Leg edema ?-seen in ER Jan 2023 with leg edema ?- BNP elevated 676. CXR mild cardiomegaly, no acute process ?- LE venous US no DVT. + for large popliteal cyst ?- received lasix in ER, discharged ?-11/2015 echo: LVEF 60-65%, grade II dd ?  ?- she is on lasix '20mg'$  at home ?- no recent edema.  ?  ?  ?3. Aortic stenosis ?-mild AS by 11/2015 echo. Mean grade 9  AVA 1.79 ?  ?09/2021 echo: LVEF 60-65%, no WMAs, low normal RV function, severe pulm HTN with mod RV enlargement. Severe BAE. Severe AS Mean gradient 24 mmHg, AVA VTI 0.78, DI 0.31 SVI 30, Criteria would indicate moderate to severe paradoxical low flow low gradient aortic stenosis, favoring the severe end of the spectrum. Visually the valve looks very tight.  ?  ?- recent DOE, just cleaning kitchen DOE. DOE just walking dog. Can have some buring in chest.  ?- ongoing workup for TAVR ?  ?4. Pulmonary HTN ?- PASP 66 mmHg, mod RV enlargement with low normal function, grade II dd ?- 2017 echo PASP 35 ?- +smoking history being treated for COPD, grade II DD on echo ?  ?10/2021 RHC: mean PA 33, PCWP not reported, LVEDP 11, CI 2.13 ? ?  ?5. COPD ?-carries diagnosis but do not see she has had formal PFTs. Nearlly 50 year smoking history ?  ?6. CAD ?- 10/2021 mild to moderate disease from cath reported below.  ?- no recent symptoms ?Past Medical  History:  ?Diagnosis Date  ? Depression   ? DJD (degenerative joint disease)   ? Hyperlipidemia   ? Hypertension   ? Thyroid disease   ? ? ? ?Allergies  ?Allergen Reactions  ? Penicillins Hives  ? Singulair [Montelukast Sodium]   ?  headache  ? Codeine Rash  ? Other Rash  ?  Band-aids cause rash after a few days  ? ? ? ?Current Outpatient Medications  ?Medication Sig Dispense Refill  ? albuterol (VENTOLIN HFA) 108 (90 Base) MCG/ACT inhaler Inhale 2 puffs into the lungs every 6 (six) hours as needed for wheezing or shortness of breath. 8 g 0  ? atorvastatin (LIPITOR) 20 MG tablet Take 1 tablet (20 mg total) by mouth daily. 90 tablet 0  ? bisoprolol (ZEBETA) 10 MG tablet Take 1.5 tablets (15 mg total) by mouth daily. 135 tablet 3  ? calcium gluconate 500 MG tablet Take 1 tablet by mouth daily.     ? cetirizine (ZYRTEC) 10 MG tablet Take 10 mg by mouth daily.    ? cholecalciferol (VITAMIN D) 1000 units tablet Take 2,000 Units by mouth daily.    ? desvenlafaxine (PRISTIQ) 50 MG 24 hr tablet Take 1 tablet (50 mg total) by mouth daily. 90 tablet 1  ? ELIQUIS 5 MG TABS tablet Take 1 tablet (  5 mg total) by mouth 2 (two) times daily. 60 tablet 2  ? fluticasone (FLONASE) 50 MCG/ACT nasal spray Place 2 sprays into both nostrils daily as needed for allergies or rhinitis. 48 g 3  ? furosemide (LASIX) 20 MG tablet Take 1 tablet (20 mg total) by mouth daily. 30 tablet 1  ? Glucosamine-Chondroitin (MOVE FREE PO) Take 1 tablet by mouth daily.    ? levothyroxine (SYNTHROID) 88 MCG tablet Take 1 tablet by mouth daily. 90 tablet 2  ? Multiple Vitamins-Minerals (ALIVE WOMENS 50+ PO) Take 1 tablet by mouth daily.    ? Multiple Vitamins-Minerals (PRESERVISION AREDS 2 PO) Take 1 capsule by mouth in the morning and at bedtime.    ? Omega-3 Fatty Acids (FISH OIL) 1200 MG CAPS Take 1,200 mg by mouth daily.    ? Probiotic Product (PROBIOTIC ADVANCED PO) Take 1 capsule by mouth daily.    ? tobramycin (TOBREX) 0.3 % ophthalmic solution Place 1  drop into the right eye See admin instructions. Instill 1 drop into the right eye three times daily, the day before, day of and day after eye injections    ? traZODone (DESYREL) 50 MG tablet Take 0.5-1 tablets (25-50 mg total) by mouth at bedtime as needed for sleep. (Patient taking differently: Take 50 mg by mouth at bedtime as needed for sleep.) 90 tablet 3  ? TURMERIC PO Take 1 capsule by mouth daily.    ? umeclidinium-vilanterol (ANORO ELLIPTA) 62.5-25 MCG/ACT AEPB Inhale 1 puff into the lungs daily at 6 (six) AM. 60 each 12  ? ?Current Facility-Administered Medications  ?Medication Dose Route Frequency Provider Last Rate Last Admin  ? sodium chloride flush (NS) 0.9 % injection 3 mL  3 mL Intravenous Q12H Kato Wieczorek, Alphonse Guild, MD      ? ? ? ?Past Surgical History:  ?Procedure Laterality Date  ? ABDOMINAL HYSTERECTOMY    ? APPENDECTOMY    ? BREAST ENHANCEMENT SURGERY    ? CARDIOVERSION N/A 10/27/2021  ? Procedure: CARDIOVERSION;  Surgeon: Arnoldo Lenis, MD;  Location: [facility];  Service: Endoscopy;  Laterality: N/A;  ? RIGHT/LEFT HEART CATH AND CORONARY ANGIOGRAPHY N/A 11/22/2021  ? Procedure: RIGHT/LEFT HEART CATH AND CORONARY ANGIOGRAPHY;  Surgeon: Sherren Mocha, MD;  Location: [facility];  Service: Cardiovascular;  Laterality: N/A;  ? TONSILLECTOMY    ? ? ? ?Allergies  ?Allergen Reactions  ? Penicillins Hives  ? Singulair [Montelukast Sodium]   ?  headache  ? Codeine Rash  ? Other Rash  ?  Band-aids cause rash after a few days  ? ? ? ? ?Family History  ?Problem Relation Age of Onset  ? Cancer Mother   ?     breast cancer at 54   ? Kidney disease Mother   ? Cancer Maternal Aunt   ?     colon cancer  ? Heart attack Father   ? Diabetes Sister   ? Obesity Sister   ? Depression Sister   ? Alcohol abuse Brother   ? Cirrhosis Brother   ? Depression Brother   ? Liver disease Brother   ? Diabetes Daughter   ? Fibromyalgia Daughter   ? Depression Daughter   ? Anxiety disorder Daughter   ? Arthritis Daughter   ?  Depression Sister   ? Obesity Sister   ? ? ? ?Social History ?Ms. Pellum reports that she has been smoking cigarettes. She has a 40.00 pack-year smoking history. She has never used smokeless tobacco. ?Ms.  [place] reports no history of alcohol use. ? ? ?Review of Systems ?CONSTITUTIONAL: No weight loss, fever, chills, weakness or fatigue.  ?HEENT: Eyes: No visual loss, blurred vision, double vision or yellow sclerae.No hearing loss, sneezing, congestion, runny nose or sore throat.  ?SKIN: No rash or itching.  ?CARDIOVASCULAR: per hpi ?RESPIRATORY: pe rhpi ?GASTROINTESTINAL: No anorexia, nausea, vomiting or diarrhea. No abdominal pain or blood.  ?GENITOURINARY: No burning on urination, no polyuria ?NEUROLOGICAL: No headache, dizziness, syncope, paralysis, ataxia, numbness or tingling in the extremities. No change in bowel or bladder control.  ?MUSCULOSKELETAL: No muscle, back pain, joint pain or stiffness.  ?LYMPHATICS: No enlarged nodes. No history of splenectomy.  ?PSYCHIATRIC: No history of depression or anxiety.  ?ENDOCRINOLOGIC: No reports of sweating, cold or heat intolerance. No polyuria or polydipsia.  ?. ? ? ?Physical Examination ?Today's Vitals  ? 12/21/21 1259  ?BP: 124/86  ?Pulse: 65  ?SpO2: 94%  ?Weight: 125 lb 3.2 oz (56.8 kg)  ?Height: 5' 2.5" (1.588 m)  ? ?Body mass index is 22.53 kg/m?. ? ?Gen: resting comfortably, no acute distress ?HEENT: no scleral icterus, pupils equal round and reactive, no palptable cervical adenopathy,  ?CV: irreg, 3/6 systolic murmur rusb, no jvd ?Resp: Clear to auscultation bilaterally ?GI: abdomen is soft, non-tender, non-distended, normal bowel sounds, no hepatosplenomegaly ?MSK: extremities are warm, no edema.  ?Skin: warm, no rash ?Neuro:  no focal deficits ?Psych: appropriate affect ? ? ?Diagnostic Studies ? ?11/2015 echo ?Study Conclusions  ? ?- Left ventricle: The cavity size was normal. Wall thickness was  ?  increased in a pattern of mild LVH. Systolic function  was normal.  ?  The estimated ejection fraction was in the range of 60% to 65%.  ?  Wall motion was normal; there were no regional wall motion  ?  abnormalities. Features are consistent with a pseudonormal lef

## 2021-12-21 NOTE — Patient Instructions (Signed)
Medication Instructions:  ?Continue all current medications. ? ?Labwork: ?none ? ?Testing/Procedures: ?Your physician has recommended that you wear a 3 day event monitor. Event monitors are medical devices that record the heart?s electrical activity. Doctors most often [place] these monitors to diagnose arrhythmias. Arrhythmias are problems with the speed or rhythm of the heartbeat. The monitor is a small, portable device. You can wear one while you do your normal daily activities. This is usually used to diagnose what is causing palpitations/syncope (passing out). ?Office will contact with results via phone or letter.    ? ?Follow-Up: ?4 months  ? ?Any Other Special Instructions Will Be Listed Below (If Applicable). ? ? ?If you need a refill on your cardiac medications before your next appointment, please call your pharmacy. ? ?

## 2021-12-27 ENCOUNTER — Encounter: Payer: Self-pay | Admitting: Physician Assistant

## 2022-01-03 DIAGNOSIS — I4891 Unspecified atrial fibrillation: Secondary | ICD-10-CM | POA: Diagnosis not present

## 2022-01-14 DIAGNOSIS — I4891 Unspecified atrial fibrillation: Secondary | ICD-10-CM | POA: Diagnosis not present

## 2022-01-17 ENCOUNTER — Encounter: Payer: Self-pay | Admitting: Physician Assistant

## 2022-01-17 DIAGNOSIS — Z720 Tobacco use: Secondary | ICD-10-CM | POA: Insufficient documentation

## 2022-01-17 DIAGNOSIS — I350 Nonrheumatic aortic (valve) stenosis: Secondary | ICD-10-CM | POA: Insufficient documentation

## 2022-01-26 ENCOUNTER — Encounter: Payer: Self-pay | Admitting: Surgery

## 2022-01-26 ENCOUNTER — Other Ambulatory Visit: Payer: Self-pay

## 2022-01-26 ENCOUNTER — Institutional Professional Consult (permissible substitution): Payer: Medicare HMO | Admitting: Surgery

## 2022-01-26 VITALS — BP 116/73 | HR 89 | Resp 20 | Ht 62.0 in

## 2022-01-26 DIAGNOSIS — I350 Nonrheumatic aortic (valve) stenosis: Secondary | ICD-10-CM

## 2022-01-26 NOTE — Progress Notes (Signed)
[facility]   [facility]         [address]       [place] [address]             [phone]          CARDIOTHORACIC SURGERY CONSULTATION REPORT  PCP is Janora Norlander, DO Referring Provider is Sherren Mocha, MD Primary Cardiologist is Carlyle Dolly, MD  Reason for consultation: Severe aortic stenosis  HPI:  The patient is a 71 year old woman with a history of hypertension, hyperlipidemia, ongoing tobacco abuse, atrial fibrillation, and severe aortic stenosis who was referred for consideration of TAVR.  She was first diagnosed with a heart murmur in 2017 and an echocardiogram at that time showed a mean gradient across the valve of 9 mmHg with a dimensionless index of 0.5 and a valve area of 1.79 cm by VTI.  Left ventricular ejection fraction was 60 to 65% with grade 2 diastolic dysfunction.  She was asymptomatic at that time.  She had a follow-up echo in February 2023 when she was found to be in atrial fibrillation and the mean gradient had increased to 24 mmHg with a valve area of 0.78 cm and a stroke-volume index of 30.  There was mild aortic insufficiency.  Left ventricular ejection fraction was 60 to 65% with mild LVH.  This was felt to be suggestive of severe paradoxical low-flow/low gradient aortic stenosis.  She underwent elective cardioversion for her atrial fibrillation but only remained in sinus rhythm for 1 week and then return to atrial fibrillation.  It was decided that she would be treated with rate control and anticoagulation pending aortic valve intervention.  She is here today by herself.  She is a retired Therapist, sports from [employer].  She has been widowed for 20 years.  Over the past several months she has developed progressive exertional fatigue and shortness of breath with walking less than 1 block.  She has had some chest pressure in the past but nothing recent.  She denies any dizziness or  syncope.  She has had no peripheral edema.  She still smokes about 1 pack/day but has smoked more in the past.  Past Medical History:  Diagnosis Date   Depression    DJD (degenerative joint disease)    Hyperlipidemia    Hypertension    Severe aortic stenosis    Thyroid disease    Tobacco abuse     Past Surgical History:  Procedure Laterality Date   ABDOMINAL HYSTERECTOMY     APPENDECTOMY     BREAST ENHANCEMENT SURGERY     CARDIOVERSION N/A 10/27/2021   Procedure: CARDIOVERSION;  Surgeon: Arnoldo Lenis, MD;  Location: [facility];  Service: Endoscopy;  Laterality: N/A;   RIGHT/LEFT HEART CATH AND CORONARY ANGIOGRAPHY N/A 11/22/2021   Procedure: RIGHT/LEFT HEART CATH AND CORONARY ANGIOGRAPHY;  Surgeon: Sherren Mocha, MD;  Location: [facility];  Service: Cardiovascular;  Laterality: N/A;   TONSILLECTOMY      Family History  Problem Relation Age of Onset   Cancer Mother        breast cancer at 17    Kidney disease Mother    Cancer Maternal Aunt        colon cancer   Heart attack Father    Diabetes Sister    Obesity Sister    Depression Sister    Alcohol abuse Brother    Cirrhosis Brother    Depression Brother  Liver disease Brother    Diabetes Daughter    Fibromyalgia Daughter    Depression Daughter    Anxiety disorder Daughter    Arthritis Daughter    Depression Sister    Obesity Sister     Social History   Socioeconomic History   Marital status: Widowed    Spouse name: Not on file   Number of children: 2   Years of education: 14   Highest education level: Associate degree: academic program  Occupational History   Occupation: Retired    Comment: Equities trader  Tobacco Use   Smoking status: Every Day    Packs/day: 1.00    Years: 40.00    Pack years: 40.00    Types: Cigarettes   Smokeless tobacco: Never  Vaping Use   Vaping Use: Never used  Substance and Sexual Activity   Alcohol use: No   Drug use: No   Sexual activity: Not Currently   Other Topics Concern   Not on file  Social History Narrative   Lives alone - one cat, one dog   Social Determinants of Health   Financial Resource Strain: Low Risk    Difficulty of Paying Living Expenses: Not hard at all  Food Insecurity: No Food Insecurity   Worried About Charity fundraiser in the Last Year: Never true   [place] in the Last Year: Never true  Transportation Needs: No Transportation Needs   Lack of Transportation (Medical): No   Lack of Transportation (Non-Medical): No  Physical Activity: Inactive   Days of Exercise per Week: 0 days   Minutes of Exercise per Session: 0 min  Stress: No Stress Concern Present   Feeling of Stress : Not at all  Social Connections: Moderately Isolated   Frequency of Communication with Friends and Family: More than three times a week   Frequency of Social Gatherings with Friends and Family: More than three times a week   Attends Religious Services: Never   Marine scientist or Organizations: Yes   Attends Music therapist: More than 4 times per year   Marital Status: Widowed  Human resources officer Violence: Not At Risk   Fear of Current or Ex-Partner: No   Emotionally Abused: No   Physically Abused: No   Sexually Abused: No    Prior to Admission medications   Medication Sig Start Date End Date Taking? Authorizing Provider  acetaminophen (TYLENOL) 500 MG tablet Take 1,000 mg by mouth every 6 (six) hours as needed for moderate pain.   Yes [provider]  albuterol (VENTOLIN HFA) 108 (90 Base) MCG/ACT inhaler Inhale 2 puffs into the lungs every 6 (six) hours as needed for wheezing or shortness of breath. 11/01/21  Yes Gottschalk, Leatrice Jewels M, DO  atorvastatin (LIPITOR) 20 MG tablet Take 1 tablet (20 mg total) by mouth daily. 12/06/21  Yes Gottschalk, Leatrice Jewels M, DO  bisoprolol (ZEBETA) 10 MG tablet Take 1.5 tablets (15 mg total) by mouth daily. 11/11/21  Yes BranchAlphonse Guild, MD  CALCIUM-MAGNESIUM PO Take 1  tablet by mouth daily.   Yes [provider]  cetirizine (ZYRTEC) 10 MG tablet Take 10 mg by mouth daily.   Yes [provider]  Cholecalciferol (VITAMIN D) 50 MCG (2000 UT) tablet Take 2,000 Units by mouth daily.   Yes [provider]  desvenlafaxine (PRISTIQ) 50 MG 24 hr tablet Take 1 tablet (50 mg total) by mouth daily. 09/08/21  Yes Gottschalk, Koleen Distance, DO  Arne Cleveland  5 MG TABS tablet Take 1 tablet (5 mg total) by mouth 2 (two) times daily. 11/05/21  Yes Gottschalk, Ashly M, DO  fluticasone (FLONASE) 50 MCG/ACT nasal spray Place 2 sprays into both nostrils daily as needed for allergies or rhinitis. 10/28/20  Yes Ronnie Doss M, DO  furosemide (LASIX) 20 MG tablet Take 1 tablet (20 mg total) by mouth daily. 12/06/21  Yes Gottschalk, Leatrice Jewels M, DO  hydrocortisone cream 1 % Apply 1 application. topically 2 (two) times daily as needed (rash).   Yes [provider]  levothyroxine (SYNTHROID) 88 MCG tablet Take 1 tablet by mouth daily. 11/29/21  Yes Gottschalk, Leatrice Jewels M, DO  Multiple Vitamins-Minerals (ALIVE WOMENS 50+ PO) Take 1 tablet by mouth daily.   Yes [provider]  Multiple Vitamins-Minerals (PRESERVISION AREDS 2 PO) Take 1 capsule by mouth in the morning and at bedtime.   Yes [provider]  Omega-3-6-9 CAPS Take 1 capsule by mouth 2 (two) times daily.   Yes [provider]  Polyethyl Glycol-Propyl Glycol (SYSTANE OP) Place 1 drop into both eyes daily as needed (dry eyes).   Yes [provider]  Probiotic Product (PROBIOTIC ADVANCED PO) Take 1 capsule by mouth daily.   Yes [provider]  tobramycin (TOBREX) 0.3 % ophthalmic solution Place 1 drop into the right eye See admin instructions. Instill 1 drop into the right eye three times daily, the day before, day of and day after eye injections 04/15/20  Yes [provider]  traZODone (DESYREL) 50 MG tablet Take 0.5-1 tablets (25-50 mg total) by mouth at bedtime  as needed for sleep. Patient taking differently: Take 50 mg by mouth at bedtime as needed for sleep. 04/15/21  Yes Gottschalk, Ashly M, DO  TURMERIC PO Take 1 capsule by mouth 2 (two) times daily.   Yes [provider]  umeclidinium-vilanterol (ANORO ELLIPTA) 62.5-25 MCG/ACT AEPB Inhale 1 puff into the lungs daily at 6 (six) AM. 11/01/21  Yes Janora Norlander, DO    Current Outpatient Medications  Medication Sig Dispense Refill   acetaminophen (TYLENOL) 500 MG tablet Take 1,000 mg by mouth every 6 (six) hours as needed for moderate pain.     albuterol (VENTOLIN HFA) 108 (90 Base) MCG/ACT inhaler Inhale 2 puffs into the lungs every 6 (six) hours as needed for wheezing or shortness of breath. 8 g 0   atorvastatin (LIPITOR) 20 MG tablet Take 1 tablet (20 mg total) by mouth daily. 90 tablet 0   bisoprolol (ZEBETA) 10 MG tablet Take 1.5 tablets (15 mg total) by mouth daily. 135 tablet 3   CALCIUM-MAGNESIUM PO Take 1 tablet by mouth daily.     cetirizine (ZYRTEC) 10 MG tablet Take 10 mg by mouth daily.     Cholecalciferol (VITAMIN D) 50 MCG (2000 UT) tablet Take 2,000 Units by mouth daily.     desvenlafaxine (PRISTIQ) 50 MG 24 hr tablet Take 1 tablet (50 mg total) by mouth daily. 90 tablet 1   ELIQUIS 5 MG TABS tablet Take 1 tablet (5 mg total) by mouth 2 (two) times daily. 60 tablet 2   fluticasone (FLONASE) 50 MCG/ACT nasal spray Place 2 sprays into both nostrils daily as needed for allergies or rhinitis. 48 g 3   furosemide (LASIX) 20 MG tablet Take 1 tablet (20 mg total) by mouth daily. 30 tablet 1   hydrocortisone cream 1 % Apply 1 application. topically 2 (two) times daily as needed (rash).     levothyroxine (SYNTHROID)  88 MCG tablet Take 1 tablet by mouth daily. 90 tablet 2   Multiple Vitamins-Minerals (ALIVE WOMENS 50+ PO) Take 1 tablet by mouth daily.     Multiple Vitamins-Minerals (PRESERVISION AREDS 2 PO) Take 1 capsule by mouth in the morning and at bedtime.     Omega-3-6-9 CAPS  Take 1 capsule by mouth 2 (two) times daily.     Polyethyl Glycol-Propyl Glycol (SYSTANE OP) Place 1 drop into both eyes daily as needed (dry eyes).     Probiotic Product (PROBIOTIC ADVANCED PO) Take 1 capsule by mouth daily.     tobramycin (TOBREX) 0.3 % ophthalmic solution Place 1 drop into the right eye See admin instructions. Instill 1 drop into the right eye three times daily, the day before, day of and day after eye injections     traZODone (DESYREL) 50 MG tablet Take 0.5-1 tablets (25-50 mg total) by mouth at bedtime as needed for sleep. (Patient taking differently: Take 50 mg by mouth at bedtime as needed for sleep.) 90 tablet 3   TURMERIC PO Take 1 capsule by mouth 2 (two) times daily.     umeclidinium-vilanterol (ANORO ELLIPTA) 62.5-25 MCG/ACT AEPB Inhale 1 puff into the lungs daily at 6 (six) AM. 60 each 12   Current Facility-Administered Medications  Medication Dose Route Frequency Provider Last Rate Last Admin   sodium chloride flush (NS) 0.9 % injection 3 mL  3 mL Intravenous Q12H Branch, Alphonse Guild, MD        Allergies  Allergen Reactions   Penicillins Hives   Singulair [Montelukast Sodium]     headache   Codeine Rash   Nicoderm [Nicotine] Hives and Swelling   Other Rash    Band-aids cause rash after a few days      Review of Systems:   General:  + decreased appetite, + decreased energy, no weight gain, no weight loss, no fever  Cardiac:  + chest burning with exertion, no chest pain at rest, +SOB with mild exertion, no resting SOB, no PND, no orthopnea, no palpitations, no arrhythmia, no atrial fibrillation, + LE edema, no dizzy spells, no syncope  Respiratory:  + exertional shortness of breath, no home oxygen, + productive cough, no dry cough, no bronchitis, no wheezing, no hemoptysis, no asthma, no pain with inspiration or cough, no sleep apnea, no CPAP at night  GI:   no difficulty swallowing, no reflux, no frequent heartburn, no hiatal hernia, no abdominal pain, no  constipation, no diarrhea, no hematochezia, no hematemesis, no melena  GU:   no dysuria,  no frequency, no urinary tract infection, no hematuria, no kidney stones, no kidney disease  Vascular:  no pain suggestive of claudication, no pain in feet, + leg cramps, no varicose veins, no DVT, no non-healing foot ulcer  Neuro:   no stroke, no TIA's, no seizures, no headaches, no temporary blindness one eye,  no slurred speech, no peripheral neuropathy, no chronic pain, no instability of gait, no memory/cognitive dysfunction  Musculoskeletal: + arthritis, no joint swelling, no myalgias, no difficulty walking, normal mobility   Skin:   + rash, + itching, no skin infections, no pressure sores or ulcerations  Psych:   + anxiety, + depression, + nervousness, + unusual recent stress  Eyes:   no blurry vision, no floaters, + recent vision changes, + wears glasses  ENT:   no hearing loss, no loose or painful teeth, + dentures  Hematologic:  + easy bruising, no abnormal bleeding, no clotting disorder, no frequent  epistaxis  Endocrine:  no diabetes, does not check CBG's at home     Physical Exam:   BP 116/73 (BP Location: Left Arm, Patient Position: Sitting)   Pulse 89   Resp 20   Ht '5\' 2"'$  (1.575 m)   SpO2 93%   BMI 22.90 kg/m   General:  well-appearing  HEENT:  Unremarkable, NCAT, PERLA, EOMI  Neck:   no JVD, no bruits, no adenopathy   Chest:   clear to auscultation, symmetrical breath sounds, no wheezes, no rhonchi   CV:   RRR, 3/6 systolic murmur RSB, no diastolic murmur  Abdomen:  soft, non-tender, no masses   Extremities:  warm, well-perfused, pulses palpable at ankles, no lower extremity edema  Rectal/GU  Deferred  Neuro:   Grossly non-focal and symmetrical throughout  Skin:   Clean and dry, no rashes, no breakdown  Diagnostic Tests:     ECHOCARDIOGRAM REPORT         Patient Name:   Tammie Martin Date of Exam: 10/20/2021  Medical Rec #:  [id number]         Height:       63.0 in   Accession #:    [id number]        Weight:       123.8 lb  Date of Birth:  Jun 28, 1951         BSA:          1.577 m  Patient Age:    29 years          BP:           100/62 mmHg  Patient Gender: F                 HR:           85 bpm.  Exam Location:  [facility]   Procedure: 2D Echo, Cardiac Doppler and Color Doppler   Indications:    Persistent atrial fibrillation (HCC) [A26.33 (ICD-10-CM)]     History:        Patient has prior history of Echocardiogram examinations,  most                  recent 12/17/2015. Arrythmias:Atrial Fibrillation; Risk                  Factors:Dyslipidemia and Hypertension.     Sonographer:    Philipp Deputy RDCS  Referring Phys: [id number] [place]     1. Left ventricular ejection fraction, by estimation, is 60 to 65%. The  left ventricle has normal function. The left ventricle has no regional  wall motion abnormalities. There is mild left ventricular hypertrophy.  Left ventricular diastolic parameters  are indeterminate.   2. Right ventricular systolic function is low normal. The right  ventricular size is moderately enlarged. There is severely elevated  pulmonary artery systolic pressure.   3. Left atrial size was severely dilated.   4. Right atrial size was severely dilated.   5. The mitral valve is abnormal. Mild mitral valve regurgitation. No  evidence of mitral stenosis. Moderate mitral annular calcification.   6. The tricuspid valve is abnormal. Tricuspid valve regurgitation is  moderate.   7. Mean gradient 24 mmHg, AVA VTI 0.78, DI 0.31 SVI 30. Criteria would  indicate moderate to severe paradoxical low flow low gradient aortic  stenosis, favoring the severe end of the spectrum. . The aortic valve has  an indeterminant number of cusps.  Aortic valve  regurgitation is mild.   8. The inferior vena cava is dilated in size with <50% respiratory  variability, suggesting right atrial pressure of 15 mmHg.   9. Possible small PFO  with left to right shunt.   FINDINGS   Left Ventricle: Left ventricular ejection fraction, by estimation, is 60  to 65%. The left ventricle has normal function. The left ventricle has no  regional wall motion abnormalities. The left ventricular internal cavity  size was normal in size. There is   mild left ventricular hypertrophy. Left ventricular diastolic parameters  are indeterminate.   Right Ventricle: The right ventricular size is moderately enlarged. No  increase in right ventricular wall thickness. Right ventricular systolic  function is low normal. There is severely elevated pulmonary artery  systolic pressure. The tricuspid  regurgitant velocity is 3.58 m/s, and with an assumed right atrial  pressure of 15 mmHg, the estimated right ventricular systolic pressure is  01.0 mmHg.   Left Atrium: Left atrial size was severely dilated.   Right Atrium: Right atrial size was severely dilated.   Pericardium: There is no evidence of pericardial effusion.   Mitral Valve: The mitral valve is abnormal. There is mild thickening of  the mitral valve leaflet(s). There is mild calcification of the mitral  valve leaflet(s). Moderate mitral annular calcification. Mild mitral valve  regurgitation. No evidence of mitral   valve stenosis.   Tricuspid Valve: The tricuspid valve is abnormal. Tricuspid valve  regurgitation is moderate . No evidence of tricuspid stenosis.   Aortic Valve: Mean gradient 24 mmHg, AVA VTI 0.78, DI 0.31 SVI 30.  Criteria would indicate moderate to severe paradoxical low flow low  gradient aortic stenosis, favoring the severe end of the spectrum. The  aortic valve has an indeterminant number of  cusps. Aortic valve regurgitation is mild. Aortic regurgitation PHT  measures 537 msec. Aortic valve mean gradient measures 23.7 mmHg. Aortic  valve peak gradient measures 37.2 mmHg. Aortic valve area, by VTI measures  0.78 cm.   Pulmonic Valve: The pulmonic valve was not  well visualized. Pulmonic valve  regurgitation is trivial. No evidence of pulmonic stenosis.   Aorta: The aortic root is normal in size and structure.   Venous: The inferior vena cava is dilated in size with less than 50%  respiratory variability, suggesting right atrial pressure of 15 mmHg.   IAS/Shunts: Possible small PFO with left to right shunt.      LEFT VENTRICLE  PLAX 2D  LVIDd:         4.16 cm   Diastology  LVIDs:         2.76 cm   LV e' medial:    5.33 cm/s  LV PW:         1.20 cm   LV E/e' medial:  26.1  LV IVS:        1.20 cm   LV e' lateral:   9.25 cm/s  LVOT diam:     1.80 cm   LV E/e' lateral: 15.0  LV SV:         48  LV SV Index:   30  LVOT Area:     2.54 cm      RIGHT VENTRICLE             IVC  RV Basal diam:  3.62 cm     IVC diam: 2.52 cm  RV S prime:     10.20 cm/s  TAPSE (M-mode): 2.1 cm   LEFT ATRIUM  Index        RIGHT ATRIUM           Index  LA diam:        4.10 cm  2.60 cm/m   RA Area:     27.70 cm  LA Vol (A2C):   127.0 ml 80.53 ml/m  RA Volume:   94.30 ml  59.80 ml/m  LA Vol (A4C):   143.0 ml 90.68 ml/m  LA Biplane Vol: 138.0 ml 87.51 ml/m   AORTIC VALVE  AV Area (Vmax):    0.70 cm  AV Area (Vmean):   0.69 cm  AV Area (VTI):     0.78 cm  AV Vmax:           305.06 cm/s  AV Vmean:          231.920 cm/s  AV VTI:            0.610 m  AV Peak Grad:      37.2 mmHg  AV Mean Grad:      23.7 mmHg  LVOT Vmax:         83.72 cm/s  LVOT Vmean:        62.555 cm/s  LVOT VTI:          0.187 m  LVOT/AV VTI ratio: 0.31  AI PHT:            537 msec     AORTA  Ao Root diam: 3.30 cm  Ao Asc diam:  3.35 cm   MITRAL VALVE                TRICUSPID VALVE  MV Area (PHT): 3.42 cm     TR Peak grad:   51.3 mmHg  MV Decel Time: 222 msec     TR Vmax:        358.00 cm/s  MR Peak grad: 124.5 mmHg  MR Vmax:      558.00 cm/s   SHUNTS  MV E velocity: 139.00 cm/s  Systemic VTI:  0.19 m                              Systemic Diam: 1.80 cm   Carlyle Dolly MD  Electronically signed by Carlyle Dolly MD  Signature Date/Time: 10/20/2021/4:29:04 PM         Final    ADDENDUM REPORT: 12/16/2021 14:05   CLINICAL DATA:  Severe Aortic Stenosis.   EXAM: Cardiac TAVR CT   TECHNIQUE: A non-contrast, gated CT scan was obtained with axial slices of 3 mm through the heart for aortic valve calcium scoring. A 120 kV retrospective, gated, contrast cardiac scan was obtained. Gantry rotation speed was 250 msecs and collimation was 0.6 mm. Nitroglycerin was not given. The 3D data set was reconstructed in 5% intervals of the 0-95% of the R-R cycle. Systolic and diastolic phases were analyzed on a dedicated workstation using MPR, MIP, and VRT modes. The patient received 100 cc of contrast.   FINDINGS: Image quality: Excellent.   Noise artifact is: Limited.   Valve Morphology: Tricuspid aortic valve with diffuse severe calcifications. Restricted movement in systole.   Aortic Valve Calcium score: 1442   Aortic annular dimension:   Phase assessed: 25%   Annular area: 411 mm2   Annular perimeter: 73.5 mm   Max diameter: 26.5 mm   Min diameter: 20.2 mm   Annular and subannular calcification: Trace calcification under the Coleman that extends  into LVOT.   Membranous septum length: 9.5 mm   Optimal coplanar projection: RAO 10 CAU 16   Coronary Artery Height above Annulus:   Left Main: 11.7 mm   Right Coronary: 20.1 mm   Sinus of Valsalva Measurements:   Non-coronary: 32 mm   Right-coronary: 32 mm   Left-coronary: 33 mm   Sinus of Valsalva Height:   Non-coronary: 22.7 mm   Right-coronary: 23.7 mm   Left-coronary: 21.1 mm   Sinotubular Junction: 29 mm   Ascending Thoracic Aorta: 34 mm   Coronary Arteries: Normal coronary origin. Right dominance. The study was performed without use of NTG and is insufficient for plaque evaluation. Please refer to recent cardiac catheterization for coronary assessment. 3-vessel  coronary calcifications.   Cardiac Morphology:   Right Atrium: Right atrial size is dilated.   Right Ventricle: The right ventricular cavity is dilated.   Left Atrium: Left atrial size is normal in size. Small PFO. Contrast mixing artifact in the LAA, but no thrombus on delayed imaging.   Left Ventricle: The ventricular cavity size is within normal limits. There are no stigmata of prior infarction. There is no abnormal filling defect.   Pulmonary arteries: Dilated, suggestive of pulmonary hypertension. No proximal filling defect.   Pulmonary veins: Normal pulmonary venous drainage.   Pericardium: Normal thickness with no significant effusion or calcium present.   Mitral Valve: The mitral valve is degenerative with severe annular calcium.   Extra-cardiac findings: See attached radiology report for non-cardiac structures.   IMPRESSION: 1. Tricuspid aortic valve with severe aortic stenosis.   2. Annular measurements support a 23 mm S3 (411 mm2).   3. Trace calcification under the [place] that extends into LVOT.   4. Sufficient coronary to annulus distance.   5. Optimal Fluoroscopic Angle for Delivery: RAO 10 CAU [place] T. Audie Box, MD     Electronically Signed   By: Eleonore Chiquito M.D.   On: 12/16/2021 14:05    Addended by Geralynn Rile, MD on 12/16/2021  2:08 PM   Study Result  Narrative & Impression  EXAM: OVER-READ INTERPRETATION  CT CHEST   The following report is an over-read performed by radiologist Dr. Yetta Glassman of [facility], [place] on 12/16/2021. This over-read does not include interpretation of cardiac or coronary anatomy or pathology. The coronary calcium score/coronary CTA interpretation by the cardiologist is attached.   COMPARISON:  None.   FINDINGS: Extracardiac findings will be described separately under dictation for contemporaneously obtained CTA chest, abdomen and pelvis.   IMPRESSION: Please see separate dictation  for contemporaneously obtained CTA chest, abdomen and pelvis dated 12/16/2021 for full description of relevant extracardiac findings.   Electronically Signed: By: Yetta Glassman M.D. On: 12/16/2021 10:28    Narrative & Impression  CLINICAL DATA:  Preop evaluation for TAVR   EXAM: CT ANGIOGRAPHY CHEST, ABDOMEN AND PELVIS   TECHNIQUE: Multidetector CT imaging through the chest, abdomen and pelvis was performed using the standard protocol during bolus administration of intravenous contrast. Multiplanar reconstructed images and MIPs were obtained and reviewed to evaluate the vascular anatomy.   RADIATION DOSE REDUCTION: This exam was performed according to the departmental dose-optimization program which includes automated exposure control, adjustment of the mA and/or kV according to patient size and/or use of iterative reconstruction technique.   CONTRAST:  157m OMNIPAQUE IOHEXOL 350 MG/ML SOLN   COMPARISON:  None.   FINDINGS: CTA CHEST FINDINGS   Cardiovascular: Cardiomegaly. No pericardial effusion. Three-vessel coronary artery calcifications.  Aortic valve thickening and calcifications. Mild atherosclerotic disease of the thoracic aorta. Normal caliber thoracic aorta. Standard 3 vessel aortic arch with moderate narrowing at the origin of the left subclavian artery due to calcified and noncalcified plaque, otherwise no significant stenosis.   Mediastinum/Nodes: Esophagus and thyroid are unremarkable. Enlarged right lower paratracheal lymph node measuring up to 1.7 cm in short axis.   Lungs/Pleura: Central airways are patent. Centrilobular emphysema. Mild right basilar atelectasis. No consolidation, pleural effusion or pneumothorax.   Musculoskeletal: No chest wall abnormality. No acute or significant osseous findings.   CTA ABDOMEN AND PELVIS FINDINGS   Hepatobiliary: No focal liver abnormality is seen. No gallstones, gallbladder wall thickening, or biliary  dilatation.   Pancreas: Unremarkable. No pancreatic ductal dilatation or surrounding inflammatory changes.   Spleen: Normal in size without focal abnormality.   Adrenals/Urinary Tract: Bilateral adrenal glands are unremarkable. No hydronephrosis or nephrolithiasis. Bilateral wedge-shaped hypodensities, likely due to scarring. Low-density lesion of the upper pole of the left kidney, likely a simple cyst, no follow-up imaging is recommended for this lesion.   Stomach/Bowel: Stomach is within normal limits. Appendix appears normal. No evidence of bowel wall thickening, distention, or inflammatory changes.   Vascular/lymphatic: Moderate atherosclerotic disease of the abdominal aorta infrarenal abdominal aortic aneurysm measuring up to 2.6 cm. Moderate narrowing at the origins of the celiac artery and right renal artery due to calcified and noncalcified plaque, otherwise no significant stenosis of the major branch vessels. 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 No pathologically enlarged lymph nodes seen in the abdomen or pelvis.   Reproductive: No adnexal masses.   Other: No abdominal wall hernia or abnormality. No abdominopelvic ascites.   Musculoskeletal: Moderate degenerative changes of the lumbar spine. No acute or significant osseous findings.   VASCULAR MEASUREMENTS PERTINENT TO TAVR:   AORTA:   Minimal Aortic Diameter -  11.6 mm   Severity of Aortic Calcification-moderate   RIGHT PELVIS:   Right Common Iliac Artery -   Minimal Diameter-6.5 mm   Tortuosity-none   Calcification-moderate   Right External Iliac Artery -   Minimal Diameter-4.7 mm   Tortuosity-none   Calcification-moderate   Right Common Femoral Artery -   Minimal Diameter-5.5 mm   Tortuosity-none   Calcification-moderate   LEFT PELVIS:   Left Common Iliac Artery -   Minimal Diameter-5.0 mm    Tortuosity-mild   Calcification-severe   Left External Iliac Artery -   Minimal Diameter-5.1 mm   Tortuosity-none   Calcification-mild   Left Common Femoral Artery -   Minimal Diameter-4.5 mm   Tortuosity-none   Calcification-mild   Review of the MIP images confirms the above findings.   IMPRESSION: 1. Vascular findings and measurements pertinent to potential TAVR procedure, as detailed above. 2. Severe thickening calcification of the aortic valve, compatible with reported clinical history of severe aortic stenosis. 3. 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 4. Infrarenal abdominal aortic aneurysm measuring up to 2.7 cm. 5. Cardiomegaly and three vessel coronary artery disease. 6. Enlarged right lower paratracheal lymph node measuring up to 1.7 cm in short axis, potentially reactive, although size is somewhat greater than expected. Recommend follow-up chest CT in 3 months.     Electronically Signed   By: Yetta Glassman M.D.   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  I have personally reviewed her 2D echocardiogram, cardiac catheterization, and CTA studies.  Her echocardiogram shows a severely calcified and restricted aortic valve with a mean gradient of 24 mmHg and a dimensionless index of 0.31.  Stroke-volume index is low at 30.  Aortic valve area by VTI is 0.78 cm.  Left ventricular ejection fraction is 60 to 65%.  Her cardiac catheterization shows mild to moderate nonobstructive coronary disease.  The peak to peak gradient across the aortic valve is 33 mmHg with a mean gradient of 31 mmHg.  Calculated  aortic valve area is 0.7 cm.  There is mild pulmonary hypertension with a PA pressure of 42/25 and a mean of 33.  I agree that aortic valve replacement is indicated in this patient for relief of her symptoms and to prevent progressive left ventricular dysfunction.  Given her age and heavy smoking history with mild CO2 retention on ABG I think that transcatheter aortic valve replacement would be the best option for treating her.  Her gated cardiac CTA shows anatomy suitable for TAVR using a SAPIEN 3 valve.  Her abdominal and pelvic CTA shows moderate to severe calcific aortoiliac atherosclerosis with a focal dissection in the right common iliac artery.  There is also marked tortuosity of the abdominal aorta which would make valve manipulation much more difficult.  She does appear to have suitable left common carotid artery and left subclavian artery anatomy to allow insertion.  The patient was counseled at length regarding treatment alternatives for management of severe symptomatic aortic stenosis. The risks and benefits of surgical intervention has been discussed in detail. Long-term prognosis with medical therapy was discussed. Alternative approaches such as conventional surgical aortic valve replacement, transcatheter aortic valve replacement, and palliative medical therapy were compared and contrasted at length. This discussion was placed in the context of the patient's own specific clinical presentation and past medical history. All of her questions have been addressed.   Following the decision to proceed with transcatheter aortic valve replacement, a discussion was held regarding what types of management strategies would be attempted intraoperatively in the event of life-threatening complications, including whether or not the patient would be considered a candidate for the use of cardiopulmonary bypass and/or conversion to open sternotomy for attempted surgical intervention.  She is relatively young and in  overall condition and I think she would be a candidate for emergent sternotomy to manage any intraoperative complications.  The patient is aware of the fact that transient use of cardiopulmonary bypass may be necessary. The patient has been advised of a variety of complications that might develop including but not limited to risks of death, stroke, paravalvular leak, aortic dissection or other major vascular complications, aortic annulus rupture, device embolization, cardiac rupture or perforation, mitral regurgitation, acute myocardial infarction, arrhythmia, heart block or bradycardia requiring permanent pacemaker placement, congestive heart failure, respiratory failure, renal failure, pneumonia, infection, other late complications related to structural valve deterioration or migration, or other complications that might ultimately cause a temporary or permanent loss of functional independence or other long term morbidity. The patient provides full informed consent for the procedure as described and all questions were answered.      Plan:  She will be scheduled for transcatheter aortic valve replacement using a SAPIEN 3 valve via the left subclavian artery on 02/01/2022.  I spent 60 minutes performing this consultation and > 50% of this time was spent face to face counseling and coordinating the care of this patient's severe symptomatic aortic stenosis.  Gaye Pollack, MD 01/26/2022 2:03 PM

## 2022-01-27 NOTE — Progress Notes (Signed)
Surgical Instructions    Your procedure is scheduled on Tuesday June 6.  Report to [facility] Main Entrance "A" at 8:45 A.M., then check in with the Admitting office.  Call this number if you have problems the morning of surgery:  [phone]   If you have any questions prior to your surgery date call [phone]: Open Monday-Friday 8am-4pm    Remember:  Do not eat or drink anything after midnight the night before your surgery    Per your surgeon's orders, take these medicines the morning of surgery with A SIP OF WATER:  levothyroxine (SYNTHROID)  Per your surgeon's instructions,stop taking Eliquis Thursday June 1.   As of today, STOP taking any Aspirin (unless otherwise instructed by your surgeon) Aleve, Naproxen, Ibuprofen, Motrin, Advil, Goody's, BC's, all herbal medications, fish oil, and all vitamins.           Do not wear jewelry or makeup Do not wear lotions, powders, perfumes/colognes, or deodorant. Do not shave 48 hours prior to surgery.  Men may shave face and neck. Do not bring valuables to the hospital. Do not wear nail polish, gel polish, artificial nails, or any other type of covering on natural nails (fingers and toes) If you have artificial nails or gel coating that need to be removed by a nail salon, please have this removed prior to surgery. Artificial nails or gel coating may interfere with anesthesia's ability to adequately monitor your vital signs.  [facility] is not responsible for any belongings or valuables. .   Do NOT Smoke (Tobacco/Vaping)  24 hours prior to your procedure  If you use a CPAP at night, you may bring your mask for your overnight stay.   Contacts, glasses, hearing aids, dentures or partials may not be worn into surgery, please bring cases for these belongings   For patients admitted to the hospital, discharge time will be determined by your treatment team.   Patients discharged the day of surgery will not be allowed to drive home, and  someone needs to stay with them for 24 hours.   SURGICAL WAITING ROOM VISITATION Patients having surgery or a procedure in a hospital may have two support people. Children under the age of 30 must have an adult with them who is not the patient. They may stay in the waiting area during the procedure and may switch out with other visitors. If the patient needs to stay at the hospital during part of their recovery, the visitor guidelines for inpatient rooms apply.  Please refer to the [facility] website for the visitor guidelines for Inpatients (after your surgery is over and you are in a regular room).       Special instructions:    Oral Hygiene is also important to reduce your risk of infection.  Remember - BRUSH YOUR TEETH THE MORNING OF SURGERY WITH YOUR REGULAR TOOTHPASTE   [facility]- Preparing For Surgery  Before surgery, you can play an important role. Because skin is not sterile, your skin needs to be as free of germs as possible. You can reduce the number of germs on your skin by washing with CHG (chlorahexidine gluconate) Soap before surgery.  CHG is an antiseptic cleaner which kills germs and bonds with the skin to continue killing germs even after washing.     Please do not use if you have an allergy to CHG or antibacterial soaps. If your skin becomes reddened/irritated stop using the CHG.  Do not shave (including legs and underarms) for at  least 48 hours prior to first CHG shower. It is OK to shave your face.  Please follow these instructions carefully.     Shower the NIGHT BEFORE SURGERY and the MORNING OF SURGERY with CHG Soap.   If you chose to wash your hair, wash your hair first as usual with your normal shampoo. After you shampoo, rinse your hair and body thoroughly to remove the shampoo.  Then [employer] and genitals (private parts) with your normal soap and rinse thoroughly to remove soap.  After that Use CHG Soap as you would any other liquid soap. You can apply  CHG directly to the skin and wash gently with a scrungie or a clean washcloth.   Apply the CHG Soap to your body ONLY FROM THE NECK DOWN.  Do not use on open wounds or open sores. Avoid contact with your eyes, ears, mouth and genitals (private parts). Wash Face and genitals (private parts)  with your normal soap.   Wash thoroughly, paying special attention to the area where your surgery will be performed.  Thoroughly rinse your body with warm water from the neck down.  DO NOT shower/wash with your normal soap after using and rinsing off the CHG Soap.  Pat yourself dry with a CLEAN TOWEL.  Wear CLEAN PAJAMAS to bed the night before surgery  Place CLEAN SHEETS on your bed the night before your surgery  DO NOT SLEEP WITH PETS.   Day of Surgery:  Take a shower with CHG soap. Wear Clean/Comfortable clothing the morning of surgery Do not apply any deodorants/lotions.   Remember to brush your teeth WITH YOUR REGULAR TOOTHPASTE.    If you received a COVID test during your pre-op visit, it is requested that you wear a mask when out in public, stay away from anyone that may not be feeling well, and notify your surgeon if you develop symptoms. If you have been in contact with anyone that has tested positive in the last 10 days, please notify your surgeon.    Please read over the following fact sheets that you were given.

## 2022-01-28 ENCOUNTER — Other Ambulatory Visit: Payer: Self-pay

## 2022-01-28 ENCOUNTER — Encounter (HOSPITAL_COMMUNITY): Payer: Self-pay

## 2022-01-28 ENCOUNTER — Encounter (HOSPITAL_COMMUNITY)
Admission: RE | Admit: 2022-01-28 | Discharge: 2022-01-28 | Disposition: A | Discharge status: Home / Self Care | Payer: Medicare HMO | Source: Ambulatory Visit | Attending: Cardiovascular Disease | Admitting: Cardiovascular Disease

## 2022-01-28 ENCOUNTER — Other Ambulatory Visit: Payer: Self-pay | Admitting: Physician Assistant

## 2022-01-28 ENCOUNTER — Ambulatory Visit (HOSPITAL_COMMUNITY)
Admission: RE | Admit: 2022-01-28 | Discharge: 2022-01-28 | Disposition: A | Discharge status: Home / Self Care | Payer: Medicare HMO | Source: Ambulatory Visit | Attending: Cardiovascular Disease | Admitting: Cardiovascular Disease

## 2022-01-28 VITALS — BP 113/79 | HR 66 | Temp 97.8°F | Resp 18 | Ht 62.0 in | Wt 122.3 lb

## 2022-01-28 DIAGNOSIS — Z01818 Encounter for other preprocedural examination: Secondary | ICD-10-CM | POA: Insufficient documentation

## 2022-01-28 DIAGNOSIS — I350 Nonrheumatic aortic (valve) stenosis: Secondary | ICD-10-CM | POA: Diagnosis not present

## 2022-01-28 DIAGNOSIS — Z20822 Contact with and (suspected) exposure to covid-19: Secondary | ICD-10-CM | POA: Insufficient documentation

## 2022-01-28 DIAGNOSIS — J449 Chronic obstructive pulmonary disease, unspecified: Secondary | ICD-10-CM | POA: Diagnosis not present

## 2022-01-28 HISTORY — DX: Anxiety disorder, unspecified: F41.9

## 2022-01-28 HISTORY — DX: Unspecified atrial fibrillation: I48.91

## 2022-01-28 HISTORY — DX: Chronic obstructive pulmonary disease, unspecified: J44.9

## 2022-01-28 HISTORY — DX: Nonrheumatic aortic (valve) stenosis: I35.0

## 2022-01-28 HISTORY — DX: Hypothyroidism, unspecified: E03.9

## 2022-01-28 LAB — URINALYSIS, ROUTINE W REFLEX MICROSCOPIC
Bacteria, UA: NONE SEEN
Bilirubin Urine: NEGATIVE
Glucose, UA: NEGATIVE mg/dL
Hgb urine dipstick: NEGATIVE
Ketones, ur: NEGATIVE mg/dL
Nitrite: NEGATIVE
Protein, ur: 30 mg/dL — AB
Specific Gravity, Urine: 1.018 (ref 1.005–1.030)
pH: 6 (ref 5.0–8.0)

## 2022-01-28 LAB — TYPE AND SCREEN
ABO/RH(D): O POS
Antibody Screen: NEGATIVE

## 2022-01-28 LAB — CBC
HCT: 45.1 % (ref 36.0–46.0)
Hemoglobin: 14.6 g/dL (ref 12.0–15.0)
MCH: 32.3 pg (ref 26.0–34.0)
MCHC: 32.4 g/dL (ref 30.0–36.0)
MCV: 99.8 fL (ref 80.0–100.0)
Platelets: 314 10*3/uL (ref 150–400)
RBC: 4.52 MIL/uL (ref 3.87–5.11)
RDW: 14 % (ref 11.5–15.5)
WBC: 10.6 10*3/uL — ABNORMAL HIGH (ref 4.0–10.5)
nRBC: 0 % (ref 0.0–0.2)

## 2022-01-28 LAB — COMPREHENSIVE METABOLIC PANEL
ALT: 37 U/L (ref 0–44)
AST: 34 U/L (ref 15–41)
Albumin: 3.5 g/dL (ref 3.5–5.0)
Alkaline Phosphatase: 112 U/L (ref 38–126)
Anion gap: 7 (ref 5–15)
BUN: 12 mg/dL (ref 8–23)
CO2: 35 mmol/L — ABNORMAL HIGH (ref 22–32)
Calcium: 9.1 mg/dL (ref 8.9–10.3)
Chloride: 98 mmol/L (ref 98–111)
Creatinine, Ser: 0.61 mg/dL (ref 0.44–1.00)
GFR, Estimated: >60 mL/min (ref >60)
Glucose, Bld: 103 mg/dL — ABNORMAL HIGH (ref 70–99)
Potassium: 3 mmol/L — ABNORMAL LOW (ref 3.5–5.1)
Sodium: 140 mmol/L (ref 135–145)
Total Bilirubin: 0.6 mg/dL (ref 0.3–1.2)
Total Protein: 7.2 g/dL (ref 6.5–8.1)

## 2022-01-28 LAB — SURGICAL PCR SCREEN
MRSA, PCR: NEGATIVE
Staphylococcus aureus: NEGATIVE

## 2022-01-28 LAB — PROTIME-INR
INR: 1.1 (ref 0.8–1.2)
Prothrombin Time: 13.9 seconds (ref 11.4–15.2)

## 2022-01-28 LAB — SARS CORONAVIRUS 2 (TAT 6-24 HRS): SARS Coronavirus 2: NEGATIVE

## 2022-01-28 IMAGING — DX DG CHEST 2V
2 series · 2 of 2 positions shown · non-contrast
Comparison: Two-view chest x-ray a E [DATE]

CLINICAL DATA: Preop for aortic valve replacement.

EXAM:
CHEST - 2 VIEW

[w chest pa]
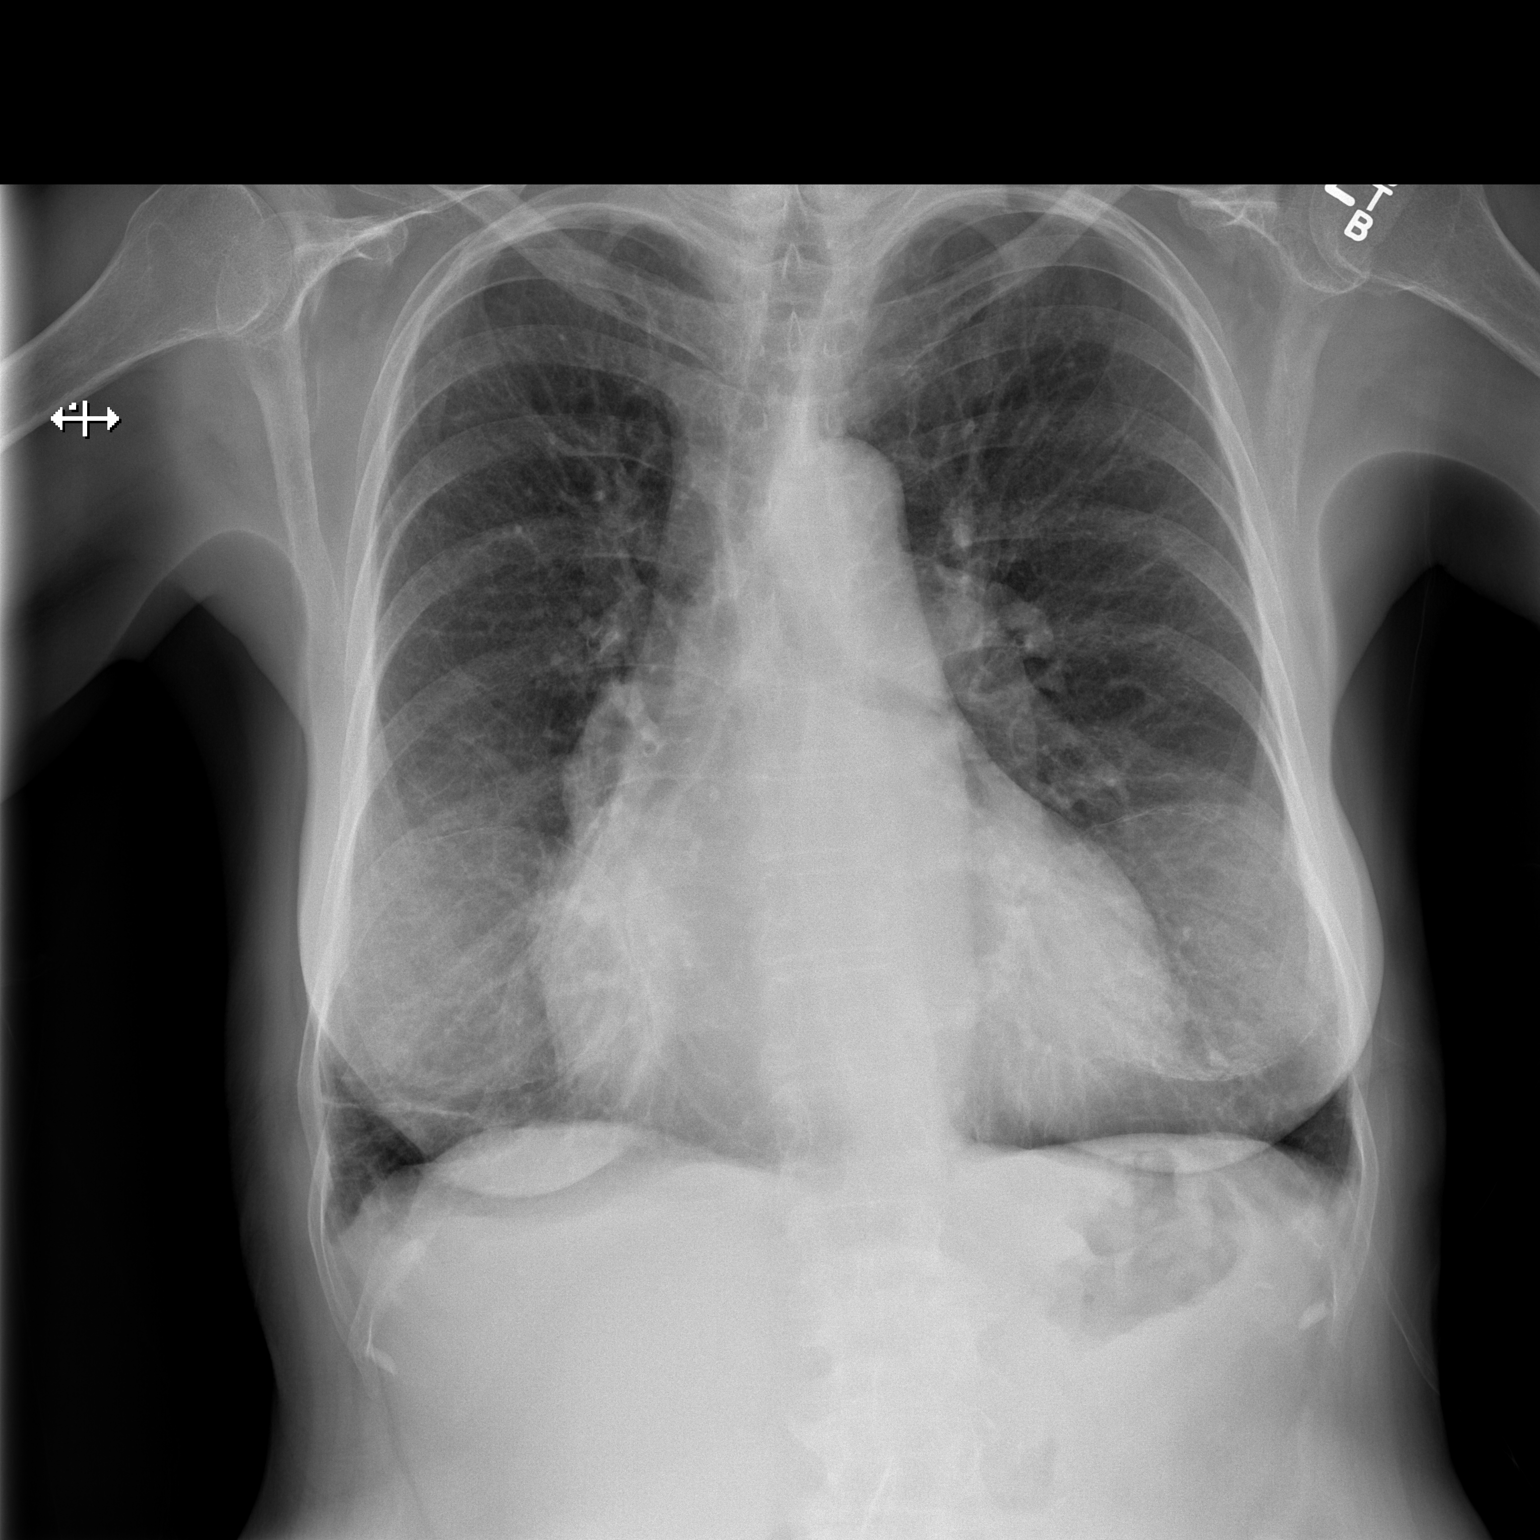

[w chest lat]
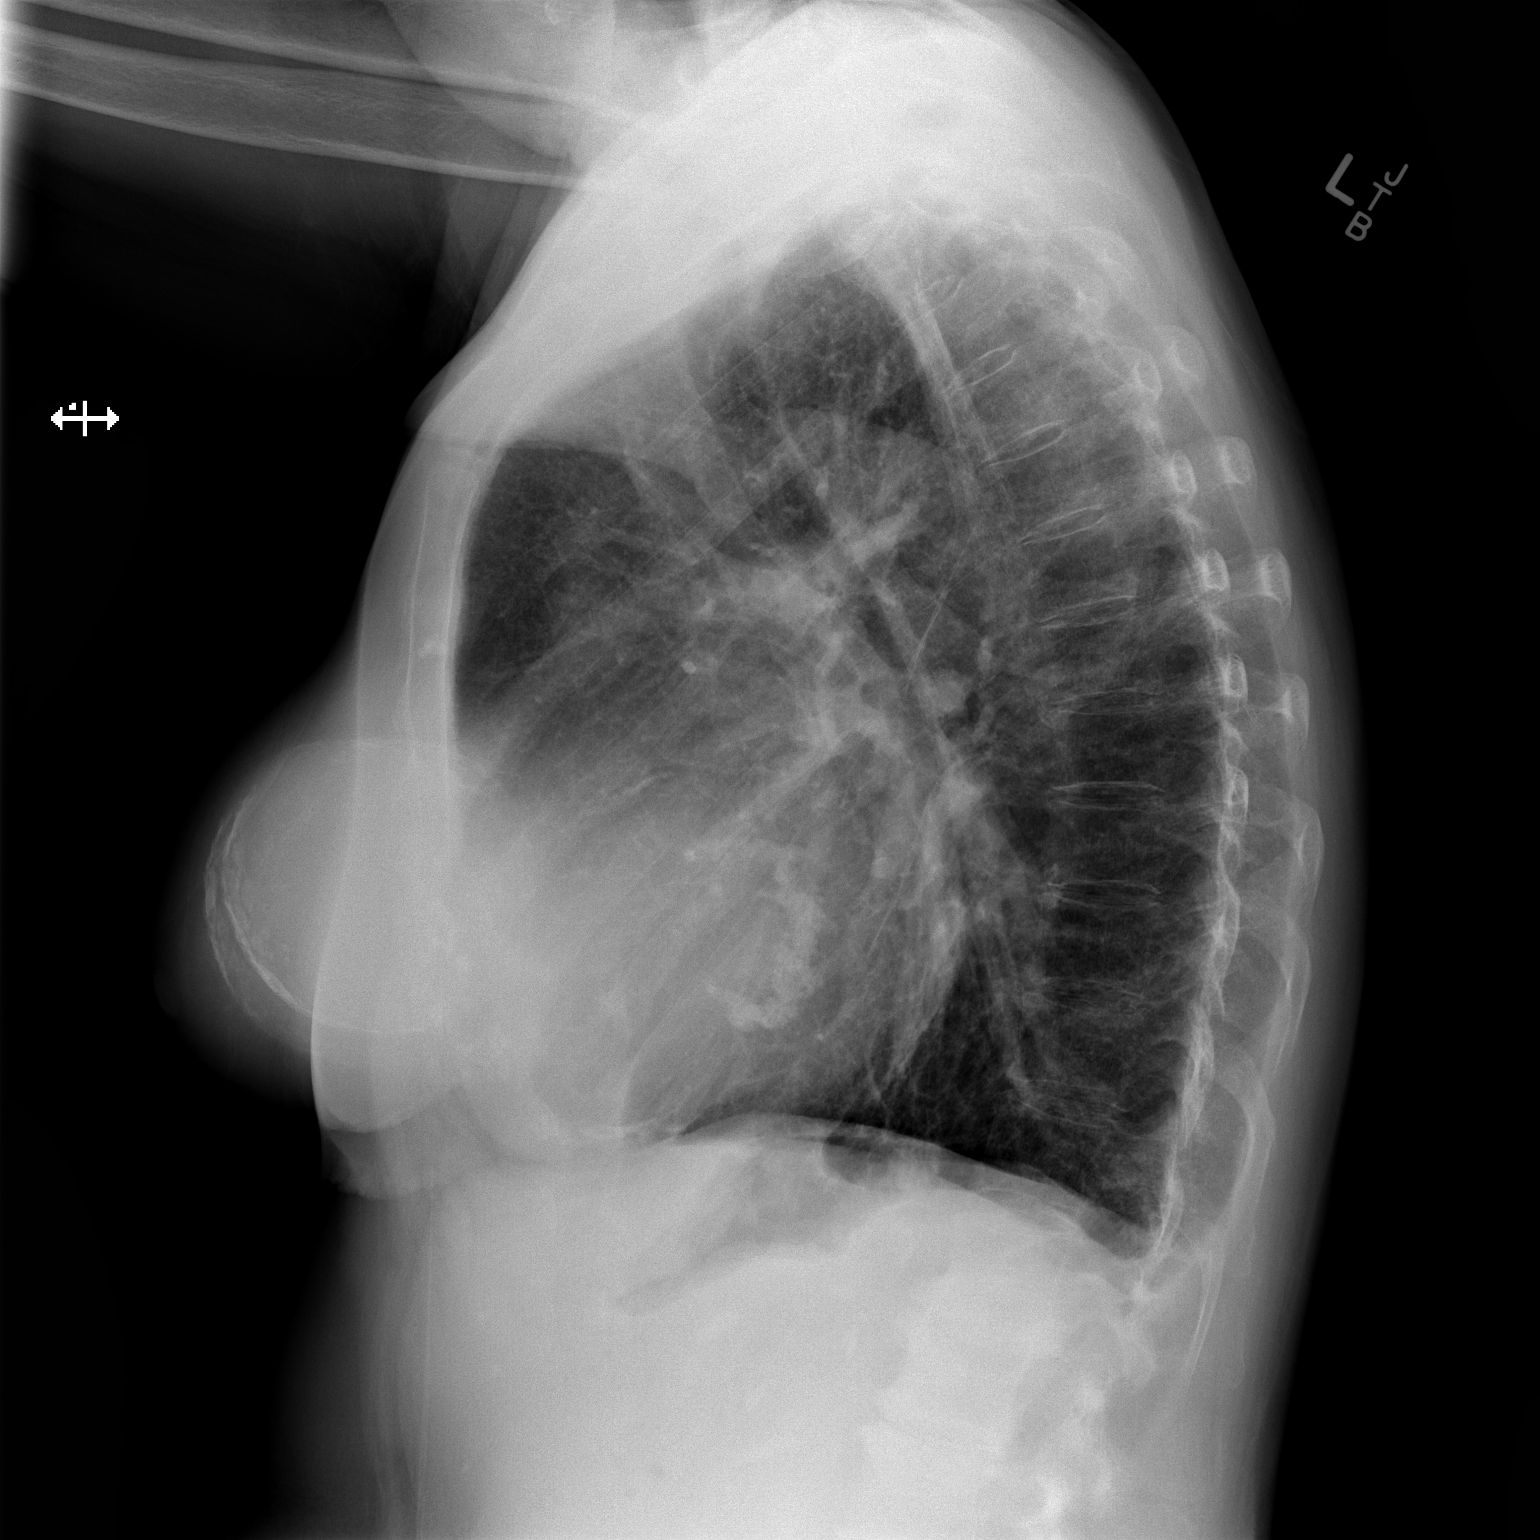

[2 of 2 positions shown; findings below may reference images not displayed]

FINDINGS: The heart is enlarged. No edema or effusion is present. Mitral
annular calcifications are present. Changes of COPD noted.

No edema or effusion is present. No focal airspace disease is
present. Bilateral breast implants noted.
IMPRESSION: Cardiomegaly without failure.

## 2022-01-28 MED ORDER — POTASSIUM CHLORIDE CRYS ER 10 MEQ PO TBCR: 10.0000 meq | EXTENDED_RELEASE_TABLET | Freq: Every day | ORAL | 3 refills | Status: DC

## 2022-01-28 NOTE — Progress Notes (Signed)
PCP - Dr. Ronnie Doss Cardiologist - Dr. Carlyle Dolly  PPM/ICD - denies   Chest x-ray - 01/28/22 EKG - 01/28/22 Stress Test - Many years ago per pt, around age 71, no abnormalities ECHO - 10/20/21 Cardiac Cath - 11/22/21  Sleep Study - denies   DM- denies  Blood Thinner Instructions: Hold Eliquis 6 days. Last dose 6/1 Aspirin Instructions: n/a  ERAS Protcol - no, NPO   COVID TEST- 01/28/22   Anesthesia review: yes, cardiac hx  Patient denies shortness of breath, fever, cough and chest pain at PAT appointment   All instructions explained to the patient, with a verbal understanding of the material. Patient agrees to go over the instructions while at home for a better understanding. Patient also instructed to wear a mask in public after being tested for COVID-19. The opportunity to ask questions was provided.

## 2022-01-31 MED ORDER — MAGNESIUM SULFATE 50 % IJ SOLN
40.0000 meq | INTRAMUSCULAR | Status: DC
  Filled 2022-01-31: qty 9.85

## 2022-01-31 MED ORDER — POTASSIUM CHLORIDE 2 MEQ/ML IV SOLN
80.0000 meq | INTRAVENOUS | Status: DC
  Filled 2022-01-31: qty 40

## 2022-01-31 MED ORDER — NOREPINEPHRINE 4 MG/250ML-% IV SOLN
0.0000 ug/min | INTRAVENOUS | Status: AC
  Administered 2022-02-01: 2 ug/min via INTRAVENOUS
  Filled 2022-01-31: qty 250

## 2022-01-31 MED ORDER — CEFAZOLIN SODIUM-DEXTROSE 2-4 GM/100ML-% IV SOLN
2.0000 g | INTRAVENOUS | Status: AC
  Administered 2022-02-01: 2 g via INTRAVENOUS
  Filled 2022-01-31: qty 100

## 2022-01-31 MED ORDER — DEXMEDETOMIDINE HCL IN NACL 400 MCG/100ML IV SOLN
0.1000 ug/kg/h | INTRAVENOUS | Status: DC
  Filled 2022-01-31: qty 100

## 2022-01-31 MED ORDER — HEPARIN 30,000 UNITS/1000 ML (OHS) CELLSAVER SOLUTION
Status: DC
  Filled 2022-01-31: qty 1000

## 2022-01-31 NOTE — H&P (Signed)
[address]       [place] [address]             [phone]      Cardiothoracic Surgery Admission History and Physical   PCP is Janora Norlander, DO Referring Provider is Sherren Mocha, MD Primary Cardiologist is Carlyle Dolly, MD   Reason for admission: Severe aortic stenosis   HPI:   The patient is a 71 year old woman with a history of hypertension, hyperlipidemia, ongoing tobacco abuse, atrial fibrillation, and severe aortic stenosis who was referred for consideration of TAVR.  She was first diagnosed with a heart murmur in 2017 and an echocardiogram at that time showed a mean gradient across the valve of 9 mmHg with a dimensionless index of 0.5 and a valve area of 1.79 cm by VTI.  Left ventricular ejection fraction was 60 to 65% with grade 2 diastolic dysfunction.  She was asymptomatic at that time.  She had a follow-up echo in February 2023 when she was found to be in atrial fibrillation and the mean gradient had increased to 24 mmHg with a valve area of 0.78 cm and a stroke-volume index of 30.  There was mild aortic insufficiency.  Left ventricular ejection fraction was 60 to 65% with mild LVH.  This was felt to be suggestive of severe paradoxical low-flow/low gradient aortic stenosis.  She underwent elective cardioversion for her atrial fibrillation but only remained in sinus rhythm for 1 week and then return to atrial fibrillation.  It was decided that she would be treated with rate control and anticoagulation pending aortic valve intervention.   She is a retired Therapist, sports from [employer].  She has been widowed for 20 years.  Over the past several months she has developed progressive exertional fatigue and shortness of breath with walking less than 1 block.  She has had some chest pressure in the past but nothing recent.  She denies any dizziness or syncope.  She has had no peripheral edema.  She still smokes about 1 pack/day but has smoked more in  the past.       Past Medical History:  Diagnosis Date   Depression     DJD (degenerative joint disease)     Hyperlipidemia     Hypertension     Severe aortic stenosis     Thyroid disease     Tobacco abuse             Past Surgical History:  Procedure Laterality Date   ABDOMINAL HYSTERECTOMY       APPENDECTOMY       BREAST ENHANCEMENT SURGERY       CARDIOVERSION N/A 10/27/2021    Procedure: CARDIOVERSION;  Surgeon: Arnoldo Lenis, MD;  Location: [facility];  Service: Endoscopy;  Laterality: N/A;   RIGHT/LEFT HEART CATH AND CORONARY ANGIOGRAPHY N/A 11/22/2021    Procedure: RIGHT/LEFT HEART CATH AND CORONARY ANGIOGRAPHY;  Surgeon: Sherren Mocha, MD;  Location: [facility];  Service: Cardiovascular;  Laterality: N/A;   TONSILLECTOMY               Family History  Problem Relation Age of Onset   Cancer Mother          breast cancer at 43    Kidney disease Mother     Cancer Maternal Aunt          colon cancer   Heart attack Father     Diabetes Sister  Obesity Sister     Depression Sister     Alcohol abuse Brother     Cirrhosis Brother     Depression Brother     Liver disease Brother     Diabetes Daughter     Fibromyalgia Daughter     Depression Daughter     Anxiety disorder Daughter     Arthritis Daughter     Depression Sister     Obesity Sister        Social History         Socioeconomic History   Marital status: Widowed      Spouse name: Not on file   Number of children: 2   Years of education: 14   Highest education level: Associate degree: academic program  Occupational History   Occupation: Retired      Comment: Equities trader  Tobacco Use   Smoking status: Every Day      Packs/day: 1.00      Years: 40.00      Pack years: 40.00      Types: Cigarettes   Smokeless tobacco: Never  Vaping Use   Vaping Use: Never used  Substance and Sexual Activity   Alcohol use: No   Drug use: No   Sexual activity: Not Currently  Other Topics Concern    Not on file  Social History Narrative    Lives alone - one cat, one dog    Social Determinants of Health       Financial Resource Strain: Low Risk    Difficulty of Paying Living Expenses: Not hard at all  Food Insecurity: No Food Insecurity   Worried About Charity fundraiser in the Last Year: Never true   [place] in the Last Year: Never true  Transportation Needs: No Transportation Needs   Lack of Transportation (Medical): No   Lack of Transportation (Non-Medical): No  Physical Activity: Inactive   Days of Exercise per Week: 0 days   Minutes of Exercise per Session: 0 min  Stress: No Stress Concern Present   Feeling of Stress : Not at all  Social Connections: Moderately Isolated   Frequency of Communication with Friends and Family: More than three times a week   Frequency of Social Gatherings with Friends and Family: More than three times a week   Attends Religious Services: Never   Marine scientist or Organizations: Yes   Attends Music therapist: More than 4 times per year   Marital Status: Widowed  Intimate Partner Violence: Not At Risk   Fear of Current or Ex-Partner: No   Emotionally Abused: No   Physically Abused: No   Sexually Abused: No             Prior to Admission medications   Medication Sig Start Date End Date Taking? Authorizing Provider  acetaminophen (TYLENOL) 500 MG tablet Take 1,000 mg by mouth every 6 (six) hours as needed for moderate pain.     Yes [provider]  albuterol (VENTOLIN HFA) 108 (90 Base) MCG/ACT inhaler Inhale 2 puffs into the lungs every 6 (six) hours as needed for wheezing or shortness of breath. 11/01/21   Yes Gottschalk, Leatrice Jewels M, DO  atorvastatin (LIPITOR) 20 MG tablet Take 1 tablet (20 mg total) by mouth daily. 12/06/21   Yes Gottschalk, Leatrice Jewels M, DO  bisoprolol (ZEBETA) 10 MG tablet Take 1.5 tablets (15 mg total) by mouth daily. 11/11/21   Yes Branch, Alphonse Guild, MD  CALCIUM-MAGNESIUM  PO Take 1  tablet by mouth daily.     Yes [provider]  cetirizine (ZYRTEC) 10 MG tablet Take 10 mg by mouth daily.     Yes [provider]  Cholecalciferol (VITAMIN D) 50 MCG (2000 UT) tablet Take 2,000 Units by mouth daily.     Yes [provider]  desvenlafaxine (PRISTIQ) 50 MG 24 hr tablet Take 1 tablet (50 mg total) by mouth daily. 09/08/21   Yes Gottschalk, Ashly M, DO  ELIQUIS 5 MG TABS tablet Take 1 tablet (5 mg total) by mouth 2 (two) times daily. 11/05/21   Yes Gottschalk, Ashly M, DO  fluticasone (FLONASE) 50 MCG/ACT nasal spray Place 2 sprays into both nostrils daily as needed for allergies or rhinitis. 10/28/20   Yes Ronnie Doss M, DO  furosemide (LASIX) 20 MG tablet Take 1 tablet (20 mg total) by mouth daily. 12/06/21   Yes Gottschalk, Leatrice Jewels M, DO  hydrocortisone cream 1 % Apply 1 application. topically 2 (two) times daily as needed (rash).     Yes [provider]  levothyroxine (SYNTHROID) 88 MCG tablet Take 1 tablet by mouth daily. 11/29/21   Yes Gottschalk, Leatrice Jewels M, DO  Multiple Vitamins-Minerals (ALIVE WOMENS 50+ PO) Take 1 tablet by mouth daily.     Yes [provider]  Multiple Vitamins-Minerals (PRESERVISION AREDS 2 PO) Take 1 capsule by mouth in the morning and at bedtime.     Yes [provider]  Omega-3-6-9 CAPS Take 1 capsule by mouth 2 (two) times daily.     Yes [provider]  Polyethyl Glycol-Propyl Glycol (SYSTANE OP) Place 1 drop into both eyes daily as needed (dry eyes).     Yes [provider]  Probiotic Product (PROBIOTIC ADVANCED PO) Take 1 capsule by mouth daily.     Yes [provider]  tobramycin (TOBREX) 0.3 % ophthalmic solution Place 1 drop into the right eye See admin instructions. Instill 1 drop into the right eye three times daily, the day before, day of and day after eye injections 04/15/20   Yes [provider]  traZODone (DESYREL) 50 MG tablet Take 0.5-1 tablets (25-50 mg  total) by mouth at bedtime as needed for sleep. Patient taking differently: Take 50 mg by mouth at bedtime as needed for sleep. 04/15/21   Yes Gottschalk, Ashly M, DO  TURMERIC PO Take 1 capsule by mouth 2 (two) times daily.     Yes [provider]  umeclidinium-vilanterol (ANORO ELLIPTA) 62.5-25 MCG/ACT AEPB Inhale 1 puff into the lungs daily at 6 (six) AM. 11/01/21   Yes Janora Norlander, DO            Current Outpatient Medications  Medication Sig Dispense Refill   acetaminophen (TYLENOL) 500 MG tablet Take 1,000 mg by mouth every 6 (six) hours as needed for moderate pain.       albuterol (VENTOLIN HFA) 108 (90 Base) MCG/ACT inhaler Inhale 2 puffs into the lungs every 6 (six) hours as needed for wheezing or shortness of breath. 8 g 0   atorvastatin (LIPITOR) 20 MG tablet Take 1 tablet (20 mg total) by mouth daily. 90 tablet 0   bisoprolol (ZEBETA) 10 MG tablet Take 1.5 tablets (15 mg total) by mouth daily. 135 tablet 3   CALCIUM-MAGNESIUM PO Take 1 tablet by mouth daily.       cetirizine (ZYRTEC) 10 MG tablet Take 10 mg by mouth daily.       Cholecalciferol (VITAMIN D) 50  MCG (2000 UT) tablet Take 2,000 Units by mouth daily.       desvenlafaxine (PRISTIQ) 50 MG 24 hr tablet Take 1 tablet (50 mg total) by mouth daily. 90 tablet 1   ELIQUIS 5 MG TABS tablet Take 1 tablet (5 mg total) by mouth 2 (two) times daily. 60 tablet 2   fluticasone (FLONASE) 50 MCG/ACT nasal spray Place 2 sprays into both nostrils daily as needed for allergies or rhinitis. 48 g 3   furosemide (LASIX) 20 MG tablet Take 1 tablet (20 mg total) by mouth daily. 30 tablet 1   hydrocortisone cream 1 % Apply 1 application. topically 2 (two) times daily as needed (rash).       levothyroxine (SYNTHROID) 88 MCG tablet Take 1 tablet by mouth daily. 90 tablet 2   Multiple Vitamins-Minerals (ALIVE WOMENS 50+ PO) Take 1 tablet by mouth daily.       Multiple Vitamins-Minerals (PRESERVISION AREDS 2 PO) Take 1 capsule by mouth  in the morning and at bedtime.       Omega-3-6-9 CAPS Take 1 capsule by mouth 2 (two) times daily.       Polyethyl Glycol-Propyl Glycol (SYSTANE OP) Place 1 drop into both eyes daily as needed (dry eyes).       Probiotic Product (PROBIOTIC ADVANCED PO) Take 1 capsule by mouth daily.       tobramycin (TOBREX) 0.3 % ophthalmic solution Place 1 drop into the right eye See admin instructions. Instill 1 drop into the right eye three times daily, the day before, day of and day after eye injections       traZODone (DESYREL) 50 MG tablet Take 0.5-1 tablets (25-50 mg total) by mouth at bedtime as needed for sleep. (Patient taking differently: Take 50 mg by mouth at bedtime as needed for sleep.) 90 tablet 3   TURMERIC PO Take 1 capsule by mouth 2 (two) times daily.       umeclidinium-vilanterol (ANORO ELLIPTA) 62.5-25 MCG/ACT AEPB Inhale 1 puff into the lungs daily at 6 (six) AM. 60 each 12             Current Facility-Administered Medications  Medication Dose Route Frequency Provider Last Rate Last Admin   sodium chloride flush (NS) 0.9 % injection 3 mL  3 mL Intravenous Q12H Branch, Alphonse Guild, MD               Allergies  Allergen Reactions   Penicillins Hives   Singulair [Montelukast Sodium]        headache   Codeine Rash   Nicoderm [Nicotine] Hives and Swelling   Other Rash      Band-aids cause rash after a few days          Review of Systems:               General:                      + decreased appetite, + decreased energy, no weight gain, no weight loss, no fever             Cardiac:                       + chest burning with exertion, no chest pain at rest, +SOB with mild exertion, no resting SOB, no PND, no orthopnea, no palpitations, no arrhythmia, no atrial fibrillation, + LE edema, no dizzy spells, no syncope  Respiratory:                 + exertional shortness of breath, no home oxygen, + productive cough, no dry cough, no bronchitis, no wheezing, no hemoptysis, no  asthma, no pain with inspiration or cough, no sleep apnea, no CPAP at night             GI:                               no difficulty swallowing, no reflux, no frequent heartburn, no hiatal hernia, no abdominal pain, no constipation, no diarrhea, no hematochezia, no hematemesis, no melena             GU:                              no dysuria,  no frequency, no urinary tract infection, no hematuria, no kidney stones, no kidney disease             Vascular:                     no pain suggestive of claudication, no pain in feet, + leg cramps, no varicose veins, no DVT, no non-healing foot ulcer             Neuro:                         no stroke, no TIA's, no seizures, no headaches, no temporary blindness one eye,  no slurred speech, no peripheral neuropathy, no chronic pain, no instability of gait, no memory/cognitive dysfunction             Musculoskeletal:         + arthritis, no joint swelling, no myalgias, no difficulty walking, normal mobility              Skin:                            + rash, + itching, no skin infections, no pressure sores or ulcerations             Psych:                         + anxiety, + depression, + nervousness, + unusual recent stress             Eyes:                           no blurry vision, no floaters, + recent vision changes, + wears glasses            ENT:                            no hearing loss, no loose or painful teeth, + dentures             Hematologic:               + easy bruising, no abnormal bleeding, no clotting disorder, no frequent epistaxis             Endocrine:  no diabetes, does not check CBG's at home                            Physical Exam:               BP 116/73 (BP Location: Left Arm, Patient Position: Sitting)   Pulse 89   Resp 20   Ht '5\' 2"'$  (1.575 m)   SpO2 93%   BMI 22.90 kg/m              General:                      well-appearing             HEENT:                       Unremarkable, NCAT, PERLA,  EOMI             Neck:                           no JVD, no bruits, no adenopathy              Chest:                          clear to auscultation, symmetrical breath sounds, no wheezes, no rhonchi              CV:                              RRR, 3/6 systolic murmur RSB, no diastolic murmur             Abdomen:                    soft, non-tender, no masses              Extremities:                 warm, well-perfused, pulses palpable at ankles, no lower extremity edema             Rectal/GU                   Deferred             Neuro:                         Grossly non-focal and symmetrical throughout             Skin:                            Clean and dry, no rashes, no breakdown   Diagnostic Tests:      ECHOCARDIOGRAM REPORT         Patient Name:   DORIANNA MCKIVER Date of Exam: 10/20/2021  Medical Rec #:  [id number]         Height:       63.0 in  Accession #:    [id number]        Weight:       123.8 lb  Date of Birth:  1950-09-07         BSA:  1.577 m  Patient Age:    76 years          BP:           100/62 mmHg  Patient Gender: F                 HR:           85 bpm.  Exam Location:  [facility]   Procedure: 2D Echo, Cardiac Doppler and Color Doppler   Indications:    Persistent atrial fibrillation (HCC) [T02.40 (ICD-10-CM)]     History:        Patient has prior history of Echocardiogram examinations,  most                  recent 12/17/2015. Arrythmias:Atrial Fibrillation; Risk                  Factors:Dyslipidemia and Hypertension.     Sonographer:    Philipp Deputy RDCS  Referring Phys: [id number] [place]     1. Left ventricular ejection fraction, by estimation, is 60 to 65%. The  left ventricle has normal function. The left ventricle has no regional  wall motion abnormalities. There is mild left ventricular hypertrophy.  Left ventricular diastolic parameters  are indeterminate.   2. Right ventricular systolic function is low normal.  The right  ventricular size is moderately enlarged. There is severely elevated  pulmonary artery systolic pressure.   3. Left atrial size was severely dilated.   4. Right atrial size was severely dilated.   5. The mitral valve is abnormal. Mild mitral valve regurgitation. No  evidence of mitral stenosis. Moderate mitral annular calcification.   6. The tricuspid valve is abnormal. Tricuspid valve regurgitation is  moderate.   7. Mean gradient 24 mmHg, AVA VTI 0.78, DI 0.31 SVI 30. Criteria would  indicate moderate to severe paradoxical low flow low gradient aortic  stenosis, favoring the severe end of the spectrum. . The aortic valve has  an indeterminant number of cusps.  Aortic valve regurgitation is mild.   8. The inferior vena cava is dilated in size with <50% respiratory  variability, suggesting right atrial pressure of 15 mmHg.   9. Possible small PFO with left to right shunt.   FINDINGS   Left Ventricle: Left ventricular ejection fraction, by estimation, is 60  to 65%. The left ventricle has normal function. The left ventricle has no  regional wall motion abnormalities. The left ventricular internal cavity  size was normal in size. There is   mild left ventricular hypertrophy. Left ventricular diastolic parameters  are indeterminate.   Right Ventricle: The right ventricular size is moderately enlarged. No  increase in right ventricular wall thickness. Right ventricular systolic  function is low normal. There is severely elevated pulmonary artery  systolic pressure. The tricuspid  regurgitant velocity is 3.58 m/s, and with an assumed right atrial  pressure of 15 mmHg, the estimated right ventricular systolic pressure is  92.4 mmHg.   Left Atrium: Left atrial size was severely dilated.   Right Atrium: Right atrial size was severely dilated.   Pericardium: There is no evidence of pericardial effusion.   Mitral Valve: The mitral valve is abnormal. There is mild thickening of   the mitral valve leaflet(s). There is mild calcification of the mitral  valve leaflet(s). Moderate mitral annular calcification. Mild mitral valve  regurgitation. No evidence of mitral   valve stenosis.   Tricuspid Valve: The tricuspid valve  is abnormal. Tricuspid valve  regurgitation is moderate . No evidence of tricuspid stenosis.   Aortic Valve: Mean gradient 24 mmHg, AVA VTI 0.78, DI 0.31 SVI 30.  Criteria would indicate moderate to severe paradoxical low flow low  gradient aortic stenosis, favoring the severe end of the spectrum. The  aortic valve has an indeterminant number of  cusps. Aortic valve regurgitation is mild. Aortic regurgitation PHT  measures 537 msec. Aortic valve mean gradient measures 23.7 mmHg. Aortic  valve peak gradient measures 37.2 mmHg. Aortic valve area, by VTI measures  0.78 cm.   Pulmonic Valve: The pulmonic valve was not well visualized. Pulmonic valve  regurgitation is trivial. No evidence of pulmonic stenosis.   Aorta: The aortic root is normal in size and structure.   Venous: The inferior vena cava is dilated in size with less than 50%  respiratory variability, suggesting right atrial pressure of 15 mmHg.   IAS/Shunts: Possible small PFO with left to right shunt.      LEFT VENTRICLE  PLAX 2D  LVIDd:         4.16 cm   Diastology  LVIDs:         2.76 cm   LV e' medial:    5.33 cm/s  LV PW:         1.20 cm   LV E/e' medial:  26.1  LV IVS:        1.20 cm   LV e' lateral:   9.25 cm/s  LVOT diam:     1.80 cm   LV E/e' lateral: 15.0  LV SV:         48  LV SV Index:   30  LVOT Area:     2.54 cm      RIGHT VENTRICLE             IVC  RV Basal diam:  3.62 cm     IVC diam: 2.52 cm  RV S prime:     10.20 cm/s  TAPSE (M-mode): 2.1 cm   LEFT ATRIUM              Index        RIGHT ATRIUM           Index  LA diam:        4.10 cm  2.60 cm/m   RA Area:     27.70 cm  LA Vol (A2C):   127.0 ml 80.53 ml/m  RA Volume:   94.30 ml  59.80 ml/m  LA Vol  (A4C):   143.0 ml 90.68 ml/m  LA Biplane Vol: 138.0 ml 87.51 ml/m   AORTIC VALVE  AV Area (Vmax):    0.70 cm  AV Area (Vmean):   0.69 cm  AV Area (VTI):     0.78 cm  AV Vmax:           305.06 cm/s  AV Vmean:          231.920 cm/s  AV VTI:            0.610 m  AV Peak Grad:      37.2 mmHg  AV Mean Grad:      23.7 mmHg  LVOT Vmax:         83.72 cm/s  LVOT Vmean:        62.555 cm/s  LVOT VTI:          0.187 m  LVOT/AV VTI ratio: 0.31  AI PHT:  537 msec     AORTA  Ao Root diam: 3.30 cm  Ao Asc diam:  3.35 cm   MITRAL VALVE                TRICUSPID VALVE  MV Area (PHT): 3.42 cm     TR Peak grad:   51.3 mmHg  MV Decel Time: 222 msec     TR Vmax:        358.00 cm/s  MR Peak grad: 124.5 mmHg  MR Vmax:      558.00 cm/s   SHUNTS  MV E velocity: 139.00 cm/s  Systemic VTI:  0.19 m                              Systemic Diam: 1.80 cm   Carlyle Dolly MD  Electronically signed by Carlyle Dolly MD  Signature Date/Time: 10/20/2021/4:29:04 PM         Final     ADDENDUM REPORT: 12/16/2021 14:05   CLINICAL DATA:  Severe Aortic Stenosis.   EXAM: Cardiac TAVR CT   TECHNIQUE: A non-contrast, gated CT scan was obtained with axial slices of 3 mm through the heart for aortic valve calcium scoring. A 120 kV retrospective, gated, contrast cardiac scan was obtained. Gantry rotation speed was 250 msecs and collimation was 0.6 mm. Nitroglycerin was not given. The 3D data set was reconstructed in 5% intervals of the 0-95% of the R-R cycle. Systolic and diastolic phases were analyzed on a dedicated workstation using MPR, MIP, and VRT modes. The patient received 100 cc of contrast.   FINDINGS: Image quality: Excellent.   Noise artifact is: Limited.   Valve Morphology: Tricuspid aortic valve with diffuse severe calcifications. Restricted movement in systole.   Aortic Valve Calcium score: 1442   Aortic annular dimension:   Phase assessed: 25%   Annular area: 411 mm2    Annular perimeter: 73.5 mm   Max diameter: 26.5 mm   Min diameter: 20.2 mm   Annular and subannular calcification: Trace calcification under the [place] that extends into LVOT.   Membranous septum length: 9.5 mm   Optimal coplanar projection: RAO 10 CAU 16   Coronary Artery Height above Annulus:   Left Main: 11.7 mm   Right Coronary: 20.1 mm   Sinus of Valsalva Measurements:   Non-coronary: 32 mm   Right-coronary: 32 mm   Left-coronary: 33 mm   Sinus of Valsalva Height:   Non-coronary: 22.7 mm   Right-coronary: 23.7 mm   Left-coronary: 21.1 mm   Sinotubular Junction: 29 mm   Ascending Thoracic Aorta: 34 mm   Coronary Arteries: Normal coronary origin. Right dominance. The study was performed without use of NTG and is insufficient for plaque evaluation. Please refer to recent cardiac catheterization for coronary assessment. 3-vessel coronary calcifications.   Cardiac Morphology:   Right Atrium: Right atrial size is dilated.   Right Ventricle: The right ventricular cavity is dilated.   Left Atrium: Left atrial size is normal in size. Small PFO. Contrast mixing artifact in the LAA, but no thrombus on delayed imaging.   Left Ventricle: The ventricular cavity size is within normal limits. There are no stigmata of prior infarction. There is no abnormal filling defect.   Pulmonary arteries: Dilated, suggestive of pulmonary hypertension. No proximal filling defect.   Pulmonary veins: Normal pulmonary venous drainage.   Pericardium: Normal thickness with no significant effusion or calcium present.   Mitral Valve: The mitral  valve is degenerative with severe annular calcium.   Extra-cardiac findings: See attached radiology report for non-cardiac structures.   IMPRESSION: 1. Tricuspid aortic valve with severe aortic stenosis.   2. Annular measurements support a 23 mm S3 (411 mm2).   3. Trace calcification under the [place] that extends into LVOT.   4.  Sufficient coronary to annulus distance.   5. Optimal Fluoroscopic Angle for Delivery: RAO 10 CAU [place] T. Audie Box, MD     Electronically Signed   By: Eleonore Chiquito M.D.   On: 12/16/2021 14:05    Addended by Geralynn Rile, MD on 12/16/2021  2:08 PM    Study Result   Narrative & Impression  EXAM: OVER-READ INTERPRETATION  CT CHEST   The following report is an over-read performed by radiologist Dr. Yetta Glassman of [facility], [place] on 12/16/2021. This over-read does not include interpretation of cardiac or coronary anatomy or pathology. The coronary calcium score/coronary CTA interpretation by the cardiologist is attached.   COMPARISON:  None.   FINDINGS: Extracardiac findings will be described separately under dictation for contemporaneously obtained CTA chest, abdomen and pelvis.   IMPRESSION: Please see separate dictation for contemporaneously obtained CTA chest, abdomen and pelvis dated 12/16/2021 for full description of relevant extracardiac findings.   Electronically Signed: By: Yetta Glassman M.D. On: 12/16/2021 10:28      Narrative & Impression  CLINICAL DATA:  Preop evaluation for TAVR   EXAM: CT ANGIOGRAPHY CHEST, ABDOMEN AND PELVIS   TECHNIQUE: Multidetector CT imaging through the chest, abdomen and pelvis was performed using the standard protocol during bolus administration of intravenous contrast. Multiplanar reconstructed images and MIPs were obtained and reviewed to evaluate the vascular anatomy.   RADIATION DOSE REDUCTION: This exam was performed according to the departmental dose-optimization program which includes automated exposure control, adjustment of the mA and/or kV according to patient size and/or use of iterative reconstruction technique.   CONTRAST:  128m OMNIPAQUE IOHEXOL 350 MG/ML SOLN   COMPARISON:  None.   FINDINGS: CTA CHEST FINDINGS   Cardiovascular: Cardiomegaly. No pericardial effusion.  Three-vessel coronary artery calcifications. Aortic valve thickening and calcifications. Mild atherosclerotic disease of the thoracic aorta. Normal caliber thoracic aorta. Standard 3 vessel aortic arch with moderate narrowing at the origin of the left subclavian artery due to calcified and noncalcified plaque, otherwise no significant stenosis.   Mediastinum/Nodes: Esophagus and thyroid are unremarkable. Enlarged right lower paratracheal lymph node measuring up to 1.7 cm in short axis.   Lungs/Pleura: Central airways are patent. Centrilobular emphysema. Mild right basilar atelectasis. No consolidation, pleural effusion or pneumothorax.   Musculoskeletal: No chest wall abnormality. No acute or significant osseous findings.   CTA ABDOMEN AND PELVIS FINDINGS   Hepatobiliary: No focal liver abnormality is seen. No gallstones, gallbladder wall thickening, or biliary dilatation.   Pancreas: Unremarkable. No pancreatic ductal dilatation or surrounding inflammatory changes.   Spleen: Normal in size without focal abnormality.   Adrenals/Urinary Tract: Bilateral adrenal glands are unremarkable. No hydronephrosis or nephrolithiasis. Bilateral wedge-shaped hypodensities, likely due to scarring. Low-density lesion of the upper pole of the left kidney, likely a simple cyst, no follow-up imaging is recommended for this lesion.   Stomach/Bowel: Stomach is within normal limits. Appendix appears normal. No evidence of bowel wall thickening, distention, or inflammatory changes.   Vascular/lymphatic: Moderate atherosclerotic disease of the abdominal aorta infrarenal abdominal aortic aneurysm measuring up to 2.6 cm. Moderate narrowing at the origins of the celiac artery and right renal  artery due to calcified and noncalcified plaque, otherwise no significant stenosis of the major branch vessels. 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 No pathologically enlarged lymph nodes seen in the abdomen or pelvis.   Reproductive: No adnexal masses.   Other: No abdominal wall hernia or abnormality. No abdominopelvic ascites.   Musculoskeletal: Moderate degenerative changes of the lumbar spine. No acute or significant osseous findings.   VASCULAR MEASUREMENTS PERTINENT TO TAVR:   AORTA:   Minimal Aortic Diameter -  11.6 mm   Severity of Aortic Calcification-moderate   RIGHT PELVIS:   Right Common Iliac Artery -   Minimal Diameter-6.5 mm   Tortuosity-none   Calcification-moderate   Right External Iliac Artery -   Minimal Diameter-4.7 mm   Tortuosity-none   Calcification-moderate   Right Common Femoral Artery -   Minimal Diameter-5.5 mm   Tortuosity-none   Calcification-moderate   LEFT PELVIS:   Left Common Iliac Artery -   Minimal Diameter-5.0 mm   Tortuosity-mild   Calcification-severe   Left External Iliac Artery -   Minimal Diameter-5.1 mm   Tortuosity-none   Calcification-mild   Left Common Femoral Artery -   Minimal Diameter-4.5 mm   Tortuosity-none   Calcification-mild   Review of the MIP images confirms the above findings.   IMPRESSION: 1. Vascular findings and measurements pertinent to potential TAVR procedure, as detailed above. 2. Severe thickening calcification of the aortic valve, compatible with reported clinical history of severe aortic stenosis. 3. 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 4. Infrarenal abdominal aortic aneurysm measuring up to 2.7 cm. 5. Cardiomegaly and three vessel coronary artery disease. 6. Enlarged right lower paratracheal lymph node measuring up to 1.7 cm in short axis, potentially reactive, although size is somewhat greater than expected. Recommend follow-up chest CT in 3 months.     Electronically Signed    By: Yetta Glassman M.D.   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  I have personally reviewed her 2D echocardiogram, cardiac catheterization, and CTA studies.  Her echocardiogram shows a severely calcified and restricted aortic valve with a mean gradient of 24 mmHg and a dimensionless index of 0.31.  Stroke-volume index is low at 30.  Aortic valve area by VTI is 0.78 cm.  Left ventricular ejection fraction is 60 to 65%.  Her cardiac catheterization shows mild to moderate nonobstructive coronary disease.  The peak to peak gradient across the aortic valve is 33 mmHg with a mean gradient of 31 mmHg.  Calculated aortic valve area is 0.7 cm.  There is mild pulmonary hypertension with a PA pressure of 42/25 and a mean of 33.  I agree that aortic valve replacement is indicated in this patient for relief of her symptoms and to prevent progressive left ventricular dysfunction.  Given her age and heavy smoking history with mild CO2 retention on ABG I think that transcatheter aortic valve replacement would be the best option for treating her.  Her gated cardiac CTA shows anatomy suitable for TAVR using a SAPIEN 3 valve.  Her abdominal and pelvic CTA shows moderate to severe calcific aortoiliac atherosclerosis with a focal  dissection in the right common iliac artery.  There is also marked tortuosity of the abdominal aorta which would make valve manipulation much more difficult.  She does appear to have suitable left common carotid artery and left subclavian artery anatomy to allow insertion.   The patient was counseled at length regarding treatment alternatives for management of severe symptomatic aortic stenosis. The risks and benefits of surgical  intervention has been discussed in detail. Long-term prognosis with medical therapy was discussed. Alternative approaches such as conventional surgical aortic valve replacement, transcatheter aortic valve replacement, and palliative medical therapy were compared and contrasted at length. This discussion was placed in the context of the patient's own specific clinical presentation and past medical history. All of her questions have been addressed.    Following the decision to proceed with transcatheter aortic valve replacement, a discussion was held regarding what types of management strategies would be attempted intraoperatively in the event of life-threatening complications, including whether or not the patient would be considered a candidate for the use of cardiopulmonary bypass and/or conversion to open sternotomy for attempted surgical intervention.  She is relatively young and in overall condition and I think she would be a candidate for emergent sternotomy to manage any intraoperative complications.  The patient is aware of the fact that transient use of cardiopulmonary bypass may be necessary. The patient has been advised of a variety of complications that might develop including but not limited to risks of death, stroke, paravalvular leak, aortic dissection or other major vascular complications, aortic annulus rupture, device embolization, cardiac rupture or perforation, mitral regurgitation, acute myocardial infarction, arrhythmia, heart block or bradycardia requiring permanent pacemaker placement, congestive heart failure, respiratory failure, renal failure, pneumonia, infection, other late complications related to structural valve deterioration or migration, or other complications that might ultimately cause a temporary or permanent loss of functional independence or other long term morbidity. The patient provides full informed consent for the procedure as described and all questions were answered.        Plan:   Transcatheter aortic valve replacement using a SAPIEN 3 valve via the left subclavian artery.     Gaye Pollack, MD

## 2022-02-01 ENCOUNTER — Other Ambulatory Visit: Payer: Self-pay | Admitting: Physician Assistant

## 2022-02-01 ENCOUNTER — Encounter (HOSPITAL_COMMUNITY)
Admission: RE | Disposition: A | Discharge status: Home / Self Care | Payer: Medicare HMO | Source: Home / Self Care | Attending: Cardiovascular Disease

## 2022-02-01 ENCOUNTER — Inpatient Hospital Stay (HOSPITAL_COMMUNITY): Payer: Medicare HMO | Admitting: Emergency Medicine

## 2022-02-01 ENCOUNTER — Inpatient Hospital Stay (HOSPITAL_COMMUNITY): Payer: Medicare HMO

## 2022-02-01 ENCOUNTER — Other Ambulatory Visit: Payer: Self-pay

## 2022-02-01 ENCOUNTER — Inpatient Hospital Stay (HOSPITAL_COMMUNITY): Payer: Medicare HMO | Admitting: Anesthesiology

## 2022-02-01 ENCOUNTER — Inpatient Hospital Stay (HOSPITAL_COMMUNITY)
Admission: RE | Admit: 2022-02-01 | Discharge: 2022-02-02 | DRG: 267 | Disposition: A | Discharge status: Home / Self Care | Payer: Medicare HMO | Attending: Cardiovascular Disease | Admitting: Cardiovascular Disease

## 2022-02-01 ENCOUNTER — Encounter (HOSPITAL_COMMUNITY): Payer: Self-pay | Admitting: Cardiovascular Disease

## 2022-02-01 DIAGNOSIS — I4892 Unspecified atrial flutter: Secondary | ICD-10-CM | POA: Diagnosis not present

## 2022-02-01 DIAGNOSIS — F17210 Nicotine dependence, cigarettes, uncomplicated: Secondary | ICD-10-CM | POA: Diagnosis not present

## 2022-02-01 DIAGNOSIS — Z90710 Acquired absence of both cervix and uterus: Secondary | ICD-10-CM

## 2022-02-01 DIAGNOSIS — I10 Essential (primary) hypertension: Secondary | ICD-10-CM | POA: Diagnosis not present

## 2022-02-01 DIAGNOSIS — Z79899 Other long term (current) drug therapy: Secondary | ICD-10-CM

## 2022-02-01 DIAGNOSIS — Z8541 Personal history of malignant neoplasm of cervix uteri: Secondary | ICD-10-CM

## 2022-02-01 DIAGNOSIS — I5032 Chronic diastolic (congestive) heart failure: Secondary | ICD-10-CM | POA: Diagnosis present

## 2022-02-01 DIAGNOSIS — Z885 Allergy status to narcotic agent status: Secondary | ICD-10-CM

## 2022-02-01 DIAGNOSIS — Z888 Allergy status to other drugs, medicaments and biological substances status: Secondary | ICD-10-CM

## 2022-02-01 DIAGNOSIS — M1990 Unspecified osteoarthritis, unspecified site: Secondary | ICD-10-CM | POA: Diagnosis present

## 2022-02-01 DIAGNOSIS — I4819 Other persistent atrial fibrillation: Secondary | ICD-10-CM | POA: Diagnosis present

## 2022-02-01 DIAGNOSIS — Z880 Allergy status to penicillin: Secondary | ICD-10-CM

## 2022-02-01 DIAGNOSIS — I3481 Nonrheumatic mitral (valve) annulus calcification: Secondary | ICD-10-CM

## 2022-02-01 DIAGNOSIS — Z952 Presence of prosthetic heart valve: Secondary | ICD-10-CM | POA: Diagnosis not present

## 2022-02-01 DIAGNOSIS — I350 Nonrheumatic aortic (valve) stenosis: Secondary | ICD-10-CM

## 2022-02-01 DIAGNOSIS — I272 Pulmonary hypertension, unspecified: Secondary | ICD-10-CM | POA: Diagnosis not present

## 2022-02-01 DIAGNOSIS — I342 Nonrheumatic mitral (valve) stenosis: Secondary | ICD-10-CM

## 2022-02-01 DIAGNOSIS — Z006 Encounter for examination for normal comparison and control in clinical research program: Secondary | ICD-10-CM

## 2022-02-01 DIAGNOSIS — E785 Hyperlipidemia, unspecified: Secondary | ICD-10-CM | POA: Diagnosis not present

## 2022-02-01 DIAGNOSIS — J9601 Acute respiratory failure with hypoxia: Secondary | ICD-10-CM | POA: Diagnosis not present

## 2022-02-01 DIAGNOSIS — Z720 Tobacco use: Secondary | ICD-10-CM | POA: Diagnosis present

## 2022-02-01 DIAGNOSIS — E039 Hypothyroidism, unspecified: Secondary | ICD-10-CM | POA: Diagnosis present

## 2022-02-01 DIAGNOSIS — Z8249 Family history of ischemic heart disease and other diseases of the circulatory system: Secondary | ICD-10-CM | POA: Diagnosis not present

## 2022-02-01 DIAGNOSIS — G9340 Encephalopathy, unspecified: Secondary | ICD-10-CM | POA: Diagnosis not present

## 2022-02-01 DIAGNOSIS — I2510 Atherosclerotic heart disease of native coronary artery without angina pectoris: Secondary | ICD-10-CM | POA: Diagnosis not present

## 2022-02-01 DIAGNOSIS — I4891 Unspecified atrial fibrillation: Secondary | ICD-10-CM

## 2022-02-01 DIAGNOSIS — K08109 Complete loss of teeth, unspecified cause, unspecified class: Secondary | ICD-10-CM | POA: Diagnosis present

## 2022-02-01 DIAGNOSIS — R569 Unspecified convulsions: Secondary | ICD-10-CM | POA: Diagnosis not present

## 2022-02-01 DIAGNOSIS — Z79890 Hormone replacement therapy (postmenopausal): Secondary | ICD-10-CM

## 2022-02-01 DIAGNOSIS — I110 Hypertensive heart disease with heart failure: Secondary | ICD-10-CM | POA: Diagnosis present

## 2022-02-01 DIAGNOSIS — I4811 Longstanding persistent atrial fibrillation: Secondary | ICD-10-CM | POA: Diagnosis not present

## 2022-02-01 DIAGNOSIS — Z7901 Long term (current) use of anticoagulants: Secondary | ICD-10-CM | POA: Diagnosis not present

## 2022-02-01 DIAGNOSIS — Z91048 Other nonmedicinal substance allergy status: Secondary | ICD-10-CM

## 2022-02-01 DIAGNOSIS — C801 Malignant (primary) neoplasm, unspecified: Secondary | ICD-10-CM

## 2022-02-01 DIAGNOSIS — J449 Chronic obstructive pulmonary disease, unspecified: Secondary | ICD-10-CM | POA: Diagnosis not present

## 2022-02-01 DIAGNOSIS — F411 Generalized anxiety disorder: Secondary | ICD-10-CM | POA: Diagnosis present

## 2022-02-01 DIAGNOSIS — G9341 Metabolic encephalopathy: Secondary | ICD-10-CM | POA: Diagnosis not present

## 2022-02-01 HISTORY — PX: INTRAOPERATIVE TRANSESOPHAGEAL ECHOCARDIOGRAM: SHX5062

## 2022-02-01 HISTORY — DX: Personal history of malignant neoplasm of cervix uteri: Z85.41

## 2022-02-01 HISTORY — DX: Presence of prosthetic heart valve: Z95.2

## 2022-02-01 LAB — POCT I-STAT, CHEM 8
BUN: 26 mg/dL — ABNORMAL HIGH (ref 8–23)
BUN: 16 mg/dL (ref 8–23)
BUN: 16 mg/dL (ref 8–23)
BUN: 16 mg/dL (ref 8–23)
Calcium, Ion: 1.16 mmol/L (ref 1.15–1.40)
Calcium, Ion: 1.12 mmol/L — ABNORMAL LOW (ref 1.15–1.40)
Calcium, Ion: 1.12 mmol/L — ABNORMAL LOW (ref 1.15–1.40)
Calcium, Ion: 1.16 mmol/L (ref 1.15–1.40)
Chloride: 101 mmol/L (ref 98–111)
Chloride: 101 mmol/L (ref 98–111)
Chloride: 102 mmol/L (ref 98–111)
Chloride: 103 mmol/L (ref 98–111)
Creatinine, Ser: 0.4 mg/dL — ABNORMAL LOW (ref 0.44–1.00)
Creatinine, Ser: 0.3 mg/dL — ABNORMAL LOW (ref 0.44–1.00)
Creatinine, Ser: 0.4 mg/dL — ABNORMAL LOW (ref 0.44–1.00)
Creatinine, Ser: 0.4 mg/dL — ABNORMAL LOW (ref 0.44–1.00)
Glucose, Bld: 100 mg/dL — ABNORMAL HIGH (ref 70–99)
Glucose, Bld: 104 mg/dL — ABNORMAL HIGH (ref 70–99)
Glucose, Bld: 108 mg/dL — ABNORMAL HIGH (ref 70–99)
Glucose, Bld: 115 mg/dL — ABNORMAL HIGH (ref 70–99)
HCT: 38 % (ref 36.0–46.0)
HCT: 35 % — ABNORMAL LOW (ref 36.0–46.0)
HCT: 36 % (ref 36.0–46.0)
HCT: 42 % (ref 36.0–46.0)
Hemoglobin: 12.9 g/dL (ref 12.0–15.0)
Hemoglobin: 11.9 g/dL — ABNORMAL LOW (ref 12.0–15.0)
Hemoglobin: 12.2 g/dL (ref 12.0–15.0)
Hemoglobin: 14.3 g/dL (ref 12.0–15.0)
Potassium: 4.7 mmol/L (ref 3.5–5.1)
Potassium: 3.7 mmol/L (ref 3.5–5.1)
Potassium: 3.8 mmol/L (ref 3.5–5.1)
Potassium: 3.9 mmol/L (ref 3.5–5.1)
Sodium: 138 mmol/L (ref 135–145)
Sodium: 137 mmol/L (ref 135–145)
Sodium: 138 mmol/L (ref 135–145)
Sodium: 138 mmol/L (ref 135–145)
TCO2: 32 mmol/L (ref 22–32)
TCO2: 26 mmol/L (ref 22–32)
TCO2: 27 mmol/L (ref 22–32)
TCO2: 25 mmol/L (ref 22–32)

## 2022-02-01 LAB — ECHO TEE
AR max vel: 2.47 cm2
AV Area VTI: 2.77 cm2
AV Area mean vel: 1.22 cm2
AV Mean grad: 2 mmHg
AV Peak grad: 5.7 mmHg
Ao pk vel: 1.19 m/s
P 1/2 time: 693 msec

## 2022-02-01 LAB — ABO/RH: ABO/RH(D): O POS

## 2022-02-01 SURGERY — IMPLANTATION, AORTIC VALVE, TRANSCATHETER, SUBCLAVIAN ARTERY APPROACH
Anesthesia: General | Site: Chest

## 2022-02-01 MED ORDER — CHLORHEXIDINE GLUCONATE 4 % EX LIQD: 30.0000 mL | CUTANEOUS | Status: DC

## 2022-02-01 MED ORDER — HEPARIN SODIUM (PORCINE) 1000 UNIT/ML IJ SOLN
INTRAMUSCULAR | Status: DC | PRN
  Administered 2022-02-01: 6000 [IU] via INTRAVENOUS

## 2022-02-01 MED ORDER — EPHEDRINE 5 MG/ML INJ
INTRAVENOUS | Status: AC
Start: 2022-02-01 — End: ?
  Filled 2022-02-01: qty 5

## 2022-02-01 MED ORDER — PROTAMINE SULFATE 10 MG/ML IV SOLN
INTRAVENOUS | Status: DC | PRN
  Administered 2022-02-01: 25 mg via INTRAVENOUS
  Administered 2022-02-01: 10 mg via INTRAVENOUS
  Administered 2022-02-01: 25 mg via INTRAVENOUS

## 2022-02-01 MED ORDER — ORAL CARE MOUTH RINSE: 15.0000 mL | Freq: Once | OROMUCOSAL | Status: AC

## 2022-02-01 MED ORDER — ACETAMINOPHEN 500 MG PO TABS
ORAL_TABLET | ORAL | Status: AC
  Administered 2022-02-01: 1000 mg via ORAL
  Filled 2022-02-01: qty 2

## 2022-02-01 MED ORDER — SODIUM CHLORIDE 0.9 % IV SOLN: 250.0000 mL | INTRAVENOUS | Status: DC | PRN

## 2022-02-01 MED ORDER — FENTANYL CITRATE (PF) 250 MCG/5ML IJ SOLN
INTRAMUSCULAR | Status: AC
  Filled 2022-02-01: qty 5

## 2022-02-01 MED ORDER — PROPOFOL 10 MG/ML IV BOLUS
INTRAVENOUS | Status: DC | PRN
  Administered 2022-02-01: 50 mg via INTRAVENOUS
  Administered 2022-02-01: 40 mg via INTRAVENOUS

## 2022-02-01 MED ORDER — CHLORHEXIDINE GLUCONATE 4 % EX LIQD: 60.0000 mL | Freq: Once | CUTANEOUS | Status: DC

## 2022-02-01 MED ORDER — SODIUM CHLORIDE 0.9% FLUSH: 3.0000 mL | INTRAVENOUS | Status: DC | PRN

## 2022-02-01 MED ORDER — SODIUM CHLORIDE 0.9% FLUSH
3.0000 mL | Freq: Two times a day (BID) | INTRAVENOUS | Status: DC
  Administered 2022-02-01 – 2022-02-02 (×2): 3 mL via INTRAVENOUS

## 2022-02-01 MED ORDER — SUGAMMADEX SODIUM 200 MG/2ML IV SOLN
INTRAVENOUS | Status: DC | PRN
  Administered 2022-02-01: 200 mg via INTRAVENOUS

## 2022-02-01 MED ORDER — OXYCODONE HCL 5 MG PO TABS: 5.0000 mg | ORAL_TABLET | ORAL | Status: DC | PRN

## 2022-02-01 MED ORDER — DEXAMETHASONE SODIUM PHOSPHATE 10 MG/ML IJ SOLN
INTRAMUSCULAR | Status: AC
Start: 2022-02-01 — End: ?
  Filled 2022-02-01: qty 1

## 2022-02-01 MED ORDER — ONDANSETRON HCL 4 MG/2ML IJ SOLN
INTRAMUSCULAR | Status: DC | PRN
  Administered 2022-02-01: 4 mg via INTRAVENOUS

## 2022-02-01 MED ORDER — CHLORHEXIDINE GLUCONATE 0.12 % MT SOLN: 15.0000 mL | Freq: Once | OROMUCOSAL | Status: DC

## 2022-02-01 MED ORDER — VANCOMYCIN HCL IN DEXTROSE 1-5 GM/200ML-% IV SOLN
1000.0000 mg | Freq: Once | INTRAVENOUS | Status: AC
  Administered 2022-02-01: 1000 mg via INTRAVENOUS
  Filled 2022-02-01: qty 200

## 2022-02-01 MED ORDER — PHENYLEPHRINE 80 MCG/ML (10ML) SYRINGE FOR IV PUSH (FOR BLOOD PRESSURE SUPPORT)
PREFILLED_SYRINGE | INTRAVENOUS | Status: DC | PRN
  Administered 2022-02-01 (×2): 80 ug via INTRAVENOUS

## 2022-02-01 MED ORDER — HEPARIN SODIUM (PORCINE) 1000 UNIT/ML IJ SOLN
INTRAMUSCULAR | Status: AC
Start: 2022-02-01 — End: ?
  Filled 2022-02-01: qty 10

## 2022-02-01 MED ORDER — PHENYLEPHRINE 80 MCG/ML (10ML) SYRINGE FOR IV PUSH (FOR BLOOD PRESSURE SUPPORT)
PREFILLED_SYRINGE | INTRAVENOUS | Status: AC
Start: 2022-02-01 — End: ?
  Filled 2022-02-01: qty 20

## 2022-02-01 MED ORDER — LIDOCAINE HCL (PF) 1 % IJ SOLN
INTRAMUSCULAR | Status: AC
  Filled 2022-02-01: qty 30

## 2022-02-01 MED ORDER — ROCURONIUM BROMIDE 10 MG/ML (PF) SYRINGE
PREFILLED_SYRINGE | INTRAVENOUS | Status: AC
Start: 2022-02-01 — End: ?
  Filled 2022-02-01: qty 10

## 2022-02-01 MED ORDER — CHLORHEXIDINE GLUCONATE 0.12 % MT SOLN: 15.0000 mL | Freq: Once | OROMUCOSAL | Status: AC

## 2022-02-01 MED ORDER — FENTANYL CITRATE (PF) 250 MCG/5ML IJ SOLN
INTRAMUSCULAR | Status: DC | PRN
  Administered 2022-02-01: 25 ug via INTRAVENOUS
  Administered 2022-02-01: 50 ug via INTRAVENOUS
  Administered 2022-02-01: 25 ug via INTRAVENOUS
  Administered 2022-02-01: 50 ug via INTRAVENOUS

## 2022-02-01 MED ORDER — DEXAMETHASONE SODIUM PHOSPHATE 10 MG/ML IJ SOLN
INTRAMUSCULAR | Status: DC | PRN
  Administered 2022-02-01: 8 mg via INTRAVENOUS

## 2022-02-01 MED ORDER — LEVOTHYROXINE SODIUM 88 MCG PO TABS
88.0000 ug | ORAL_TABLET | Freq: Every day | ORAL | Status: DC
  Administered 2022-02-02: 88 ug via ORAL
  Filled 2022-02-01: qty 1

## 2022-02-01 MED ORDER — TRAZODONE HCL 50 MG PO TABS: 50.0000 mg | ORAL_TABLET | Freq: Every evening | ORAL | Status: DC | PRN

## 2022-02-01 MED ORDER — ROCURONIUM BROMIDE 10 MG/ML (PF) SYRINGE
PREFILLED_SYRINGE | INTRAVENOUS | Status: DC | PRN
  Administered 2022-02-01: 20 mg via INTRAVENOUS
  Administered 2022-02-01: 50 mg via INTRAVENOUS

## 2022-02-01 MED ORDER — LACTATED RINGERS IV SOLN: INTRAVENOUS | Status: DC

## 2022-02-01 MED ORDER — ACETAMINOPHEN 325 MG PO TABS
650.0000 mg | ORAL_TABLET | Freq: Four times a day (QID) | ORAL | Status: DC | PRN
  Administered 2022-02-02: 650 mg via ORAL
  Filled 2022-02-01: qty 2

## 2022-02-01 MED ORDER — ATORVASTATIN CALCIUM 10 MG PO TABS
20.0000 mg | ORAL_TABLET | Freq: Every day | ORAL | Status: DC
Start: 2022-02-01 — End: 2022-02-02
  Administered 2022-02-01 – 2022-02-02 (×2): 20 mg via ORAL
  Filled 2022-02-01 (×2): qty 2

## 2022-02-01 MED ORDER — ACETAMINOPHEN 500 MG PO TABS: 1000.0000 mg | ORAL_TABLET | Freq: Once | ORAL | Status: AC

## 2022-02-01 MED ORDER — PROTAMINE SULFATE 10 MG/ML IV SOLN
INTRAVENOUS | Status: AC
  Filled 2022-02-01: qty 10

## 2022-02-01 MED ORDER — ALBUTEROL SULFATE HFA 108 (90 BASE) MCG/ACT IN AERS
INHALATION_SPRAY | RESPIRATORY_TRACT | Status: DC | PRN
  Administered 2022-02-01: 2 via RESPIRATORY_TRACT

## 2022-02-01 MED ORDER — LIDOCAINE 2% (20 MG/ML) 5 ML SYRINGE
INTRAMUSCULAR | Status: DC | PRN
  Administered 2022-02-01: 60 mg via INTRAVENOUS

## 2022-02-01 MED ORDER — CHLORHEXIDINE GLUCONATE 0.12 % MT SOLN
OROMUCOSAL | Status: AC
  Administered 2022-02-01: 15 mL via OROMUCOSAL
  Filled 2022-02-01: qty 15

## 2022-02-01 MED ORDER — LIDOCAINE HCL 1 % IJ SOLN: INTRAMUSCULAR | Status: DC | PRN

## 2022-02-01 MED ORDER — ONDANSETRON HCL 4 MG/2ML IJ SOLN: 4.0000 mg | Freq: Four times a day (QID) | INTRAMUSCULAR | Status: DC | PRN

## 2022-02-01 MED ORDER — VENLAFAXINE HCL ER 75 MG PO CP24
75.0000 mg | ORAL_CAPSULE | Freq: Every day | ORAL | Status: DC
  Administered 2022-02-02: 75 mg via ORAL
  Filled 2022-02-01: qty 1

## 2022-02-01 MED ORDER — IODIXANOL 320 MG/ML IV SOLN
INTRAVENOUS | Status: DC | PRN
  Administered 2022-02-01: 60 mL via INTRA_ARTERIAL

## 2022-02-01 MED ORDER — HEPARIN 6000 UNIT IRRIGATION SOLUTION
Status: DC | PRN
  Administered 2022-02-01: 3

## 2022-02-01 MED ORDER — SODIUM CHLORIDE (PF) 0.9 % IJ SOLN
INTRAMUSCULAR | Status: AC
  Filled 2022-02-01: qty 10

## 2022-02-01 MED ORDER — ACETAMINOPHEN 650 MG RE SUPP: 650.0000 mg | Freq: Four times a day (QID) | RECTAL | Status: DC | PRN

## 2022-02-01 MED ORDER — SODIUM CHLORIDE 0.9 % IV SOLN: INTRAVENOUS | Status: AC

## 2022-02-01 MED ORDER — LIDOCAINE 2% (20 MG/ML) 5 ML SYRINGE
INTRAMUSCULAR | Status: AC
  Filled 2022-02-01: qty 5

## 2022-02-01 MED ORDER — SODIUM CHLORIDE 0.9 % IV SOLN: INTRAVENOUS | Status: DC

## 2022-02-01 MED ORDER — PHENYLEPHRINE HCL-NACL 20-0.9 MG/250ML-% IV SOLN
INTRAVENOUS | Status: DC | PRN
  Administered 2022-02-01: 25 ug/min via INTRAVENOUS

## 2022-02-01 MED ORDER — NITROGLYCERIN IN D5W 200-5 MCG/ML-% IV SOLN: 0.0000 ug/min | INTRAVENOUS | Status: DC

## 2022-02-01 SURGICAL SUPPLY — 94 items
ADH SKN CLS APL DERMABOND .7 (GAUZE/BANDAGES/DRESSINGS) ×4
BAG COUNTER SPONGE SURGICOUNT (BAG) ×3 IMPLANT
BAG DECANTER FOR FLEXI CONT (MISCELLANEOUS) IMPLANT
BAG SNAP BAND KOVER 36X36 (MISCELLANEOUS) ×3 IMPLANT
BAG SPNG CNTER NS LX DISP (BAG) ×2
BLADE CLIPPER SURG (BLADE) IMPLANT
BLADE OSCILLATING /SAGITTAL (BLADE) IMPLANT
BLADE STERNUM SYSTEM 6 (BLADE) IMPLANT
CABLE ADAPT CONN TEMP 6FT (ADAPTER) ×3 IMPLANT
CANNULA FEM VENOUS REMOTE 22FR (CANNULA) IMPLANT
CANNULA OPTISITE PERFUSION 16F (CANNULA) IMPLANT
CANNULA OPTISITE PERFUSION 18F (CANNULA) IMPLANT
CATH DIAG EXPO 6F AL1 (CATHETERS) IMPLANT
CATH DIAG EXPO 6F VENT PIG 145 (CATHETERS) ×6 IMPLANT
CATH EXTERNAL FEMALE PUREWICK (CATHETERS) IMPLANT
CATH INFINITI 6F AL2 (CATHETERS) IMPLANT
CATH S G BIP PACING (CATHETERS) ×3 IMPLANT
CLIP VESOCCLUDE MED 24/CT (CLIP) IMPLANT
CLIP VESOCCLUDE SM WIDE 24/CT (CLIP) IMPLANT
CLOSURE MYNX CONTROL 6F/7F (Vascular Products) ×1 IMPLANT
CNTNR URN SCR LID CUP LEK RST (MISCELLANEOUS) ×4 IMPLANT
CONT SPEC 4OZ STRL OR WHT (MISCELLANEOUS) ×6
COVER BACK TABLE 24X17X13 BIG (DRAPES) IMPLANT
COVER BACK TABLE 80X110 HD (DRAPES) ×6 IMPLANT
COVER DOME SNAP 22 D (MISCELLANEOUS) IMPLANT
DERMABOND ADVANCED (GAUZE/BANDAGES/DRESSINGS) ×2
DERMABOND ADVANCED .7 DNX12 (GAUZE/BANDAGES/DRESSINGS) ×2 IMPLANT
DEVICE CLOSURE PERCLS PRGLD 6F (VASCULAR PRODUCTS) ×4 IMPLANT
DRAPE INCISE IOBAN 66X45 STRL (DRAPES) IMPLANT
DRSG TEGADERM 4X4.75 (GAUZE/BANDAGES/DRESSINGS) ×6 IMPLANT
ELECT CAUTERY BLADE 6.4 (BLADE) IMPLANT
ELECT REM PT RETURN 9FT ADLT (ELECTROSURGICAL) ×6
ELECTRODE REM PT RTRN 9FT ADLT (ELECTROSURGICAL) ×4 IMPLANT
FELT TEFLON 6X6 (MISCELLANEOUS) IMPLANT
FEMORAL VENOUS CANN RAP (CANNULA) IMPLANT
GAUZE SPONGE 4X4 12PLY STRL (GAUZE/BANDAGES/DRESSINGS) ×3 IMPLANT
GLOVE BIO SURGEON STRL SZ7.5 (GLOVE) IMPLANT
GLOVE BIO SURGEON STRL SZ8 (GLOVE) IMPLANT
GLOVE ECLIPSE 7.0 STRL STRAW (GLOVE) ×3 IMPLANT
GLOVE EUDERMIC 7 POWDERFREE (GLOVE) IMPLANT
GLOVE ORTHO TXT STRL SZ7.5 (GLOVE) IMPLANT
GOWN STRL REUS W/ TWL LRG LVL3 (GOWN DISPOSABLE) IMPLANT
GOWN STRL REUS W/ TWL XL LVL3 (GOWN DISPOSABLE) ×2 IMPLANT
GOWN STRL REUS W/TWL LRG LVL3 (GOWN DISPOSABLE)
GOWN STRL REUS W/TWL XL LVL3 (GOWN DISPOSABLE) ×3
GUIDEWIRE SAFE TJ AMPLATZ EXST (WIRE) ×3 IMPLANT
INSERT FOGARTY SM (MISCELLANEOUS) IMPLANT
KIT BASIN OR (CUSTOM PROCEDURE TRAY) ×3 IMPLANT
KIT DILATOR VASC 18G NDL (KITS) IMPLANT
KIT HEART LEFT (KITS) ×3 IMPLANT
KIT SAPIAN 3 ULTRA RESILIA 23 (Valve) ×1 IMPLANT
KIT SUCTION CATH 14FR (SUCTIONS) IMPLANT
KIT TURNOVER KIT B (KITS) ×3 IMPLANT
LOOP VESSEL MAXI BLUE (MISCELLANEOUS) IMPLANT
LOOP VESSEL MINI RED (MISCELLANEOUS) IMPLANT
NDL PERC 18GX7CM (NEEDLE) ×2 IMPLANT
NEEDLE 22X1 1/2 (OR ONLY) (NEEDLE) IMPLANT
NEEDLE PERC 18GX7CM (NEEDLE) ×3 IMPLANT
NS IRRIG 1000ML POUR BTL (IV SOLUTION) ×9 IMPLANT
PACK ENDOVASCULAR (PACKS) ×3 IMPLANT
PAD ARMBOARD 7.5X6 YLW CONV (MISCELLANEOUS) ×6 IMPLANT
PAD ELECT DEFIB RADIOL ZOLL (MISCELLANEOUS) ×3 IMPLANT
PENCIL BUTTON HOLSTER BLD 10FT (ELECTRODE) ×3 IMPLANT
PERCLOSE PROGLIDE 6F (VASCULAR PRODUCTS)
POSITIONER HEAD DONUT 9IN (MISCELLANEOUS) ×3 IMPLANT
SET MICROPUNCTURE 5F STIFF (MISCELLANEOUS) ×3 IMPLANT
SHEATH BRITE TIP 6FR 35CM (SHEATH) ×3 IMPLANT
SHEATH PINNACLE 6F 10CM (SHEATH) ×3 IMPLANT
SHEATH PINNACLE 8F 10CM (SHEATH) ×3 IMPLANT
SLEEVE REPOSITIONING LENGTH 30 (MISCELLANEOUS) ×3 IMPLANT
SPONGE T-LAP 4X18 ~~LOC~~+RFID (SPONGE) IMPLANT
STOPCOCK MORSE 400PSI 3WAY (MISCELLANEOUS) ×6 IMPLANT
SUT ETHIBOND X763 2 0 SH 1 (SUTURE) IMPLANT
SUT GORETEX CV 4 TH 22 36 (SUTURE) ×1 IMPLANT
SUT GORETEX CV4 TH-18 (SUTURE) ×2 IMPLANT
SUT MNCRL AB 3-0 PS2 18 (SUTURE) IMPLANT
SUT PROLENE 5 0 C 1 36 (SUTURE) IMPLANT
SUT PROLENE 6 0 C 1 30 (SUTURE) IMPLANT
SUT SILK  1 MH (SUTURE) ×3
SUT SILK 1 MH (SUTURE) ×2 IMPLANT
SUT VIC AB 2-0 CT1 27 (SUTURE)
SUT VIC AB 2-0 CT1 TAPERPNT 27 (SUTURE) IMPLANT
SUT VIC AB 2-0 CTX 36 (SUTURE) IMPLANT
SUT VIC AB 3-0 SH 8-18 (SUTURE) IMPLANT
SYR 50ML LL SCALE MARK (SYRINGE) ×3 IMPLANT
SYR BULB IRRIG 60ML STRL (SYRINGE) IMPLANT
SYR CONTROL 10ML LL (SYRINGE) IMPLANT
TOWEL GREEN STERILE (TOWEL DISPOSABLE) ×3 IMPLANT
TOWEL GREEN STERILE FF (TOWEL DISPOSABLE) ×3 IMPLANT
TRANSDUCER W/STOPCOCK (MISCELLANEOUS) ×6 IMPLANT
TRAY FOLEY SLVR 16FR TEMP STAT (SET/KITS/TRAYS/PACK) IMPLANT
WIRE EMERALD 3MM-J .035X150CM (WIRE) ×3 IMPLANT
WIRE EMERALD 3MM-J .035X260CM (WIRE) ×3 IMPLANT
WIRE EMERALD ST .035X260CM (WIRE) ×3 IMPLANT

## 2022-02-01 NOTE — Progress Notes (Signed)
  [facility]VE TEAM  Patient doing well s/p TAVR. She is hemodynamically stable. Groin/subclavian sites stable. ECG with persistent atrial flutter and no high grade block. Arterial line discontinued and transferred to 4E. Plan for early ambulation after bedrest completed and hopeful discharge over the next 24-48 hours.   Angelena Form PA-C  [facility]  Pager [phone]

## 2022-02-01 NOTE — Anesthesia Preprocedure Evaluation (Signed)
Anesthesia Evaluation  Patient identified by MRN, date of birth, ID band Patient awake    Reviewed: Allergy & Precautions, NPO status , Patient's Chart, lab work & pertinent test results, reviewed documented beta blocker date and time   Airway Mallampati: II  TM Distance: >3 FB Neck ROM: Full    Dental  (+) Dental Advisory Given, Missing, Edentulous Upper   Pulmonary COPD,  COPD inhaler, Current Smoker and Patient abstained from smoking.,    Pulmonary exam normal breath sounds clear to auscultation       Cardiovascular hypertension, Pt. on medications and Pt. on home beta blockers + dysrhythmias Atrial Fibrillation + Valvular Problems/Murmurs AS  Rhythm:Irregular Rate:Abnormal + Systolic murmurs    Neuro/Psych PSYCHIATRIC DISORDERS Anxiety Depression negative neurological ROS     GI/Hepatic negative GI ROS, Neg liver ROS,   Endo/Other  Hypothyroidism   Renal/GU negative Renal ROS     Musculoskeletal  (+) Arthritis ,   Abdominal   Peds  Hematology  (+) Blood dyscrasia (Eliquis), ,   Anesthesia Other Findings Day of surgery medications reviewed with the patient.  Reproductive/Obstetrics                             Anesthesia Physical Anesthesia Plan  ASA: 4  Anesthesia Plan: General   Post-op Pain Management: Tylenol PO (pre-op)*   Induction: Intravenous  PONV Risk Score and Plan: 2 and Dexamethasone and Ondansetron  Airway Management Planned: Oral ETT  Additional Equipment: Arterial line  Intra-op Plan:   Post-operative Plan: Extubation in OR  Informed Consent: I have reviewed the patients History and Physical, chart, labs and discussed the procedure including the risks, benefits and alternatives for the proposed anesthesia with the patient or authorized representative who has indicated his/her understanding and acceptance.     Dental advisory given  Plan Discussed with:  CRNA  Anesthesia Plan Comments:         Anesthesia Quick Evaluation

## 2022-02-01 NOTE — Anesthesia Procedure Notes (Signed)
Arterial Line Insertion Start/End6/01/2022 9:36 AM, 02/01/2022 9:36 AM Performed by: CRNA  Patient location: Pre-op. Preanesthetic checklist: patient identified, IV checked, site marked, risks and benefits discussed, surgical consent, monitors and equipment checked, pre-op evaluation, timeout performed and anesthesia consent Lidocaine 1% used for infiltration Right, radial was placed Catheter size: 20 G Hand hygiene performed , maximum sterile barriers used  and Seldinger technique used Allen's test indicative of satisfactory collateral circulation Attempts: 1 Procedure performed without using ultrasound guided technique. Following insertion, dressing applied and Biopatch. Post procedure assessment: normal  Patient tolerated the procedure well with no immediate complications.

## 2022-02-01 NOTE — Interval H&P Note (Signed)
History and Physical Interval Note:  02/01/2022 9:33 AM  Tammie Martin  has presented today for surgery, with the diagnosis of Severe Aortic Stenosis.  The various methods of treatment have been discussed with the patient and family. After consideration of risks, benefits and other options for treatment, the patient has consented to  Procedure(s): Transcatheter Aortic Valve Replacement-Subclavian (Left) INTRAOPERATIVE TRANSESOPHAGEAL ECHOCARDIOGRAM (N/A) as a surgical intervention.  The patient's history has been reviewed, patient examined, no change in status, stable for surgery.  I have reviewed the patient's chart and labs.  Questions were answered to the patient's satisfaction.     Gaye Pollack

## 2022-02-01 NOTE — Discharge Instructions (Signed)

## 2022-02-01 NOTE — Op Note (Signed)
[facility]   MULTIDISCIPLINARY HEART VALVE TEAM   TAVR OPERATIVE NOTE   Date of Procedure:  02/01/2022  Preoperative Diagnosis: Severe Aortic Stenosis   Postoperative Diagnosis: Same   Procedure:   Transcatheter Aortic Valve Replacement - Right Subclavian Artery Approach  Edwards Sapien 3 Ultra Resilia THV (size 23 mm, model # 9755RSL:, serial # [id number])   Co-Surgeons:  Gaye Pollack, MD and Sherren Mocha, MD    Anesthesiologist:  S. Gifford Shave, MD  Echocardiographer:  Viona Gilmore. 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  I have personally reviewed her 2D echocardiogram, cardiac catheterization, and CTA studies.  Her echocardiogram shows a severely calcified and restricted aortic valve with a mean gradient of 24 mmHg and a dimensionless index of 0.31.  Stroke-volume index is low at 30.  Aortic valve area by VTI is 0.78 cm.  Left ventricular ejection fraction is 60 to 65%.  Her cardiac catheterization shows mild to moderate nonobstructive coronary disease.  The peak to peak gradient across the aortic valve is 33 mmHg with a mean gradient of 31 mmHg.  Calculated aortic valve area is 0.7 cm.  There is mild pulmonary hypertension with a PA pressure of 42/25 and a mean of 33.  I agree that aortic valve replacement is indicated in this patient for relief of her symptoms and to prevent progressive left ventricular dysfunction.  Given her age and heavy smoking history with mild CO2 retention on ABG I think that  transcatheter aortic valve replacement would be the best option for treating her.  Her gated cardiac CTA shows anatomy suitable for TAVR using a SAPIEN 3 valve.  Her abdominal and pelvic CTA shows moderate to severe calcific aortoiliac atherosclerosis with a focal dissection in the right common iliac artery.  There is also marked tortuosity of the abdominal aorta which would make valve manipulation much more difficult.  She does appear to have suitable left common carotid artery and left subclavian artery anatomy to allow insertion.   The patient was counseled at length regarding treatment alternatives for management of severe symptomatic aortic stenosis. The risks and benefits of surgical intervention has been discussed in detail. Long-term prognosis with medical therapy was discussed. Alternative approaches such as conventional surgical aortic valve replacement, transcatheter aortic valve replacement, and palliative medical therapy were compared and contrasted at length. This discussion was placed in the context of the patient's own specific clinical presentation and past medical history. All of her questions have been addressed.    Following the decision to proceed with transcatheter aortic valve replacement, a discussion was held regarding what types of management strategies would be attempted intraoperatively in the event of life-threatening complications, including whether or not the patient would be considered a candidate for the use of cardiopulmonary bypass and/or conversion to open sternotomy for attempted surgical intervention.  She is relatively young and in overall condition and I think she would be a candidate for emergent sternotomy to manage any intraoperative complications.  The patient is aware of the fact that transient use of cardiopulmonary bypass may be necessary. The patient has been advised of a variety of  complications that might develop including but not limited to risks of death,  stroke, paravalvular leak, aortic dissection or other major vascular complications, aortic annulus rupture, device embolization, cardiac rupture or perforation, mitral regurgitation, acute myocardial infarction, arrhythmia, heart block or bradycardia requiring permanent pacemaker placement, congestive heart failure, respiratory failure, renal failure, pneumonia, infection, other late complications related to structural valve deterioration or migration, or other complications that might ultimately cause a temporary or permanent loss of functional independence or other long term morbidity. The patient provides full informed consent for the procedure as described and all questions were answered.     DETAILS OF THE OPERATIVE PROCEDURE  PREPARATION:    The patient was brought to the operating room on the above mentioned date and appropriate monitoring was established by the anesthesia team. The patient was placed in the supine position on the operating table.  Intravenous antibiotics were administered.  General endotracheal anesthesia was induced uneventfully.  Baseline transesophageal echocardiogram was performed. The patient's abdomen and both groins were prepped and draped in a sterile manner. A time out procedure was performed.   PERIPHERAL ACCESS:    Using the modified Seldinger technique, femoral arterial and venous access was obtained with placement of 6 Fr sheaths on the right side.  A pigtail diagnostic catheter was passed through the right arterial sheath under fluoroscopic guidance into the aortic root.  A temporary transvenous pacemaker catheter was passed through the right femoral venous sheath under fluoroscopic guidance into the right ventricle.  The pacemaker was tested to ensure stable lead placement and pacemaker capture. Aortic root angiography was performed in order to determine the optimal angiographic angle for valve deployment.   LEFT SUBCLAVIAN ACCESS:   A transverse incision  was made below the right clavicle and carried down through the subcutaneous tissue using electrocautery. The pectoralis major muscle was split along its fibers and the pectoralis minor muscle retracted laterally. The right axillary artery was identified and encircled with a vessel loop. The patient was heparinized systemically and ACT verified > 250 seconds.  A double concentric purse string suture of CV-4 gortex was placed in the anterior wall of the artery. The artery was cannulated with a needle and a J- wire advanced into the ascending aorta. An 8 F sheath was inserted over the wire. The aortic valve was crossed with an AL-1 catheter and a straight wire. This was exchanged for a pigtail catheter and position was confirmed in the LV apex. Simultaneous LV and Ao pressures were recorded.  The pigtail catheter was exchanged for a Safari wire in the LV apex.  Then a 74F E-sheath was inserted into the axillary artery and the tip advanced into the aortic arch.  BALLOON AORTIC VALVULOPLASTY:   Not performed.  TRANSCATHETER HEART VALVE DEPLOYMENT:   An Edwards Sapien 3 Ultra transcatheter heart valve (size 23 mm) was prepared and crimped per manufacturer's guidelines, and the proper orientation of the valve is confirmed on the [employer] delivery system. The valve was advanced through the introducer sheath using normal technique until in an appropriate position in the ascending aorta beyond the sheath tip. The balloon was then retracted and using the fine-tuning wheel was centered on the valve.  The valve was carefully positioned across the aortic valve annulus. The Commander catheter was retracted using normal technique. Once final position of the valve has been confirmed by angiographic assessment, the valve is deployed while temporarily holding ventilation and during rapid ventricular pacing to maintain systolic blood pressure < 50  mmHg and pulse pressure < 10 mmHg. The balloon inflation is held for >3  seconds after reaching full deployment volume. Once the balloon has fully deflated the balloon is retracted into the ascending aorta and valve function is assessed using echocardiography. There is felt to be trivial paravalvular leak and no central aortic insufficiency. Post-procedural gradients were acceptable. The patient's hemodynamic recovery following valve deployment is good.  The deployment balloon and guidewire are both removed.      PROCEDURE COMPLETION:   The sheath was removed and axillary artery closure performed.  Protamine was administered once femoral arterial repair was complete. The chest incision was closed in layers with 2-0 Vicryl suture to approximate the pectoral is muscle, 3-0 Vicryl subcutaneous closure and 3-0 Vicryl subcuticular skin closure. Dermabond was applied to the incision. The temporary pacemaker, pigtail catheter and femoral sheaths were removed with manual pressure used for venous hemostasis.  A Mynx femoral closure device was utilized following removal of the diagnostic sheath in the right femoral artery.  The patient tolerated the procedure well and is transported to the cath lab recovery area in stable condition. There were no immediate intraoperative complications. All sponge instrument and needle counts are verified correct at completion of the operation.   No blood products were administered during the operation.  The patient received a total of 60 mL of intravenous contrast during the procedure.   Gaye Pollack, MD 02/01/2022

## 2022-02-01 NOTE — Transfer of Care (Signed)
Immediate Anesthesia Transfer of Care Note  Patient: Tammie Martin  Procedure(s) Performed: Transcatheter Aortic Valve Replacement-Subclavian (Left: Chest) INTRAOPERATIVE TRANSESOPHAGEAL ECHOCARDIOGRAM  Patient Location: Cath Lab  Anesthesia Type:General  Level of Consciousness: awake, alert  and oriented  Airway & Oxygen Therapy: Patient Spontanous Breathing and Patient connected to face mask oxygen  Post-op Assessment: Report given to RN and Post -op Vital signs reviewed and stable  Post vital signs: Reviewed and stable  Last Vitals:  Vitals Value Taken Time  BP 121/81 02/01/22 1359  Temp 36.9 C 02/01/22 1359  Pulse 90 02/01/22 1359  Resp 18 02/01/22 1359  SpO2      Last Pain:  Vitals:   02/01/22 1359  TempSrc: Temporal  PainSc: 0-No pain         Complications: No notable events documented.

## 2022-02-01 NOTE — Progress Notes (Signed)
             20 G , R radial arterial catheter was removed, and manual pressure was held for 10 min. No hematoma or bleeding were present. Sterile gauze was applied at the site. R radial pulse was 2+ end capillary refill of < 3 sec.

## 2022-02-01 NOTE — Progress Notes (Signed)
Mobility Specialist: Progress Note   02/01/22 1838  Mobility  Activity Ambulated independently in hallway  Level of Assistance Contact guard assist, steadying assist  Assistive Device None  Distance Ambulated (ft) 430 ft  Activity Response Tolerated well  $Mobility charge 1 Mobility   Pre-Mobility: 107 HR, 128/99 (110) BP, 93% SpO2 During Mobility: 120s-130s HR Post-Mobility: 110 HR, 100% SpO2  Received pt in bed having no complaints and agreeable to mobility. Asymptomatic throughout ambulation, returned back to bed w/ call bell in reach and all needs met.  [facility] Modestine Scherzinger Mobility Specialist Mobility Specialist 5 North: [phone] Mobility Specialist 6 North: [phone]

## 2022-02-01 NOTE — Op Note (Signed)
[facility]   MULTIDISCIPLINARY HEART VALVE TEAM   TAVR OPERATIVE NOTE   Date of Procedure:  02/01/2022  Preoperative Diagnosis: Severe Aortic Stenosis   Postoperative Diagnosis: Same   Procedure:   Transcatheter Aortic Valve Replacement - Percutaneous  Transfemoral Approach  Edwards Sapien 3 Ultra ResiliaTHV (size 23 mm, serial # [id number])   Co-Surgeons:  Gaye Pollack, MD and Sherren Mocha, MD  Anesthesiologist:  Hoy Morn, MD  Echocardiographer:  Marry Guan, MD  Pre-operative Echo Findings: Severe aortic stenosis Normal left ventricular systolic function  Post-operative Echo Findings: Trivial paravalvular leak Normal left ventricular systolic function  BRIEF CLINICAL NOTE AND INDICATIONS FOR SURGERY  71 year old woman with 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  She undergoes multidisciplinary evaluation for TAVR work-up.  She is found to have anatomy suitable for a 23 mm Edwards SAPIEN 3 valve.  However, she does not have transfemoral access and after review of her chest CTA, she is felt to be a good candidate for right subclavian artery access.  During the course of the patient's preoperative work up they have been evaluated comprehensively by a multidisciplinary team of specialists coordinated through the [facility] in the [facility].  They have been demonstrated to suffer from symptomatic severe aortic stenosis as noted above. The patient has been counseled extensively as to the relative risks and benefits of all options for the treatment of severe aortic stenosis including long term medical therapy, conventional surgery for aortic valve replacement, and transcatheter aortic valve replacement.  The patient has been independently evaluated in formal cardiac surgical consultation by  Dr Cyndia Bent, who deemed the patient appropriate for TAVR. Based upon review of all of the patient's preoperative diagnostic tests they are felt to be candidate for transcatheter aortic valve replacement using the transaxillary approach as an alternative to conventional surgery.    Following the decision to proceed with transcatheter aortic valve replacement, a discussion has been held regarding what types of management strategies would be attempted intraoperatively in the event of life-threatening complications, including whether or not the patient would be considered a candidate for the use of cardiopulmonary bypass and/or conversion to open sternotomy for attempted surgical intervention.  The patient has been advised of a variety of complications that might develop peculiar to this approach including but not limited to risks of death, stroke, paravalvular leak, aortic dissection or other major vascular complications, aortic annulus rupture, device embolization, cardiac rupture or perforation, acute myocardial infarction, arrhythmia, heart block or bradycardia requiring permanent pacemaker placement, congestive heart failure, respiratory failure, renal failure, pneumonia, infection, other late complications related to structural valve deterioration or migration, or other complications that might ultimately cause a temporary or permanent loss of functional independence or other long term morbidity.  The patient provides full informed consent for the procedure as described and all questions were answered preoperatively.  DETAILS OF THE OPERATIVE PROCEDURE  PREPARATION:   The patient is brought to the operating room on the above mentioned date and central monitoring was established by the anesthesia team including placement of a radial arterial line. The patient is placed in the supine position on the operating table.  Intravenous antibiotics are administered. General endotracheal anesthesia is induced  uneventfully.    Baseline transesophageal echocardiogram is performed. The patient's chest, abdomen, both groins, and both lower extremities are prepared and draped in a  sterile manner. A time out procedure is performed.   PERIPHERAL ACCESS:   Using ultrasound guidance, femoral arterial and venous access is obtained with placement of 6 Fr sheaths on the right side.  [place] images are digitally captured and stored in the patient's chart. A pigtail diagnostic catheter was passed through the femoral arterial sheath under fluoroscopic guidance into the aortic root.  A temporary transvenous pacemaker catheter was passed through the femoral venous sheath under fluoroscopic guidance into the right ventricle.  The pacemaker was tested to ensure stable lead placement and pacemaker capture. Aortic root angiography was performed in order to determine the optimal angiographic angle for valve deployment.  TRANSAXILLARY ACCESS:  Please see the complete note of Dr. Cyndia Bent.  Once the right subclavian artery is exposed and pursestring sutures placed, the artery is accessed and a J-wire was advanced into the ascending aorta.  A 6 French sheath is inserted.  An AL-1 catheter is used to direct a J-wire across the aortic valve into the left ventricle where it is changed out for a pigtail catheter and ultimately a safari wire.  Over the safari wire, a 10 French E sheath is inserted without difficulty.  TRANSCATHETER HEART VALVE DEPLOYMENT:  An Edwards Sapien 3 transcatheter heart valve (size 23 mm) was prepared and crimped per manufacturer's guidelines, and the proper orientation of the valve is confirmed on the [employer] delivery system. The valve was advanced through the introducer sheath using normal technique until in an appropriate position in the ascending aorta beyond the sheath tip. The balloon was then retracted and using the fine-tuning wheel was centered on the valve. The valve was then advanced using  appropriate flexion of the catheter. The valve was carefully positioned across the aortic valve annulus. The Commander catheter was retracted using normal technique. Once final position of the valve has been confirmed by angiographic assessment, the valve is deployed while temporarily holding ventilation and during rapid ventricular pacing to maintain systolic blood pressure < 50 mmHg and pulse pressure < 10 mmHg. The balloon inflation is held for >3 seconds after reaching full deployment volume. Once the balloon has fully deflated the balloon is retracted into the ascending aorta and valve function is assessed using echocardiography. The patient's hemodynamic recovery following valve deployment is good.  The deployment balloon and guidewire are both removed. Echo demostrated acceptable post-procedural gradients, stable mitral valve function, and trivial aortic insufficiency.    PROCEDURE COMPLETION:  Please see the complete note of Dr. Cyndia Bent for details of subclavian artery closure.  The temporary pacemaker and pigtail catheters are removed. Mynx closure is used for contralateral femoral arterial hemostasis for the 6 Fr sheath.  The patient tolerated the procedure well and is transported to the recovery area in stable condition. There were no immediate intraoperative complications. All sponge instrument and needle counts are verified correct at completion of the operation.   The patient received a total of 60 mL of intravenous contrast during the procedure.   Sherren Mocha, MD 02/01/2022 4:37 PM

## 2022-02-01 NOTE — Anesthesia Postprocedure Evaluation (Signed)
Anesthesia Post Note  Patient: Tammie Martin  Procedure(s) Performed: Transcatheter Aortic Valve Replacement-Subclavian (Left: Chest) INTRAOPERATIVE TRANSESOPHAGEAL ECHOCARDIOGRAM     Patient location during evaluation: Cath Lab Anesthesia Type: General Level of consciousness: awake and alert Pain management: pain level controlled Vital Signs Assessment: post-procedure vital signs reviewed and stable Respiratory status: spontaneous breathing, nonlabored ventilation, respiratory function stable and patient connected to nasal cannula oxygen Cardiovascular status: blood pressure returned to baseline and stable Postop Assessment: no apparent nausea or vomiting Anesthetic complications: no   No notable events documented.  Last Vitals:  Vitals:   02/01/22 1800 02/01/22 1957  BP: (!) 128/99 (!) 128/95  Pulse: 95 60  Resp: 20 20  Temp:  36.6 C  SpO2: 91% 92%    Last Pain:  Vitals:   02/01/22 1957  TempSrc: Oral  PainSc:                  Santa Lighter

## 2022-02-01 NOTE — Anesthesia Procedure Notes (Signed)
Procedure Name: Intubation Date/Time: 02/01/2022 11:38 AM Performed by: Mariea Clonts, CRNA Pre-anesthesia Checklist: Patient identified, Emergency Drugs available, Suction available and Patient being monitored Patient Re-evaluated:Patient Re-evaluated prior to induction Oxygen Delivery Method: Circle System Utilized Preoxygenation: Pre-oxygenation with 100% oxygen Induction Type: IV induction Ventilation: Mask ventilation without difficulty Grade View: Grade I Tube type: Oral Tube size: 7.5 mm Number of attempts: 1 Airway Equipment and Method: Stylet and Oral airway Placement Confirmation: ETT inserted through vocal cords under direct vision, positive ETCO2 and breath sounds checked- equal and bilateral Tube secured with: Tape Dental Injury: Teeth and Oropharynx as per pre-operative assessment

## 2022-02-02 ENCOUNTER — Inpatient Hospital Stay (HOSPITAL_COMMUNITY): Payer: Medicare HMO

## 2022-02-02 ENCOUNTER — Encounter: Payer: Medicare HMO | Admitting: Surgery

## 2022-02-02 ENCOUNTER — Encounter (HOSPITAL_COMMUNITY): Payer: Self-pay | Admitting: Cardiovascular Disease

## 2022-02-02 ENCOUNTER — Other Ambulatory Visit: Payer: Self-pay | Admitting: Family Medicine

## 2022-02-02 DIAGNOSIS — I350 Nonrheumatic aortic (valve) stenosis: Principal | ICD-10-CM

## 2022-02-02 DIAGNOSIS — Z952 Presence of prosthetic heart valve: Secondary | ICD-10-CM

## 2022-02-02 DIAGNOSIS — I509 Heart failure, unspecified: Secondary | ICD-10-CM

## 2022-02-02 DIAGNOSIS — I4891 Unspecified atrial fibrillation: Secondary | ICD-10-CM

## 2022-02-02 LAB — CBC
HCT: 37.5 % (ref 36.0–46.0)
Hemoglobin: 12.6 g/dL (ref 12.0–15.0)
MCH: 32.7 pg (ref 26.0–34.0)
MCHC: 33.6 g/dL (ref 30.0–36.0)
MCV: 97.4 fL (ref 80.0–100.0)
Platelets: 194 10*3/uL (ref 150–400)
RBC: 3.85 MIL/uL — ABNORMAL LOW (ref 3.87–5.11)
RDW: 13.7 % (ref 11.5–15.5)
WBC: 10.3 10*3/uL (ref 4.0–10.5)
nRBC: 0 % (ref 0.0–0.2)

## 2022-02-02 LAB — BASIC METABOLIC PANEL
Anion gap: 8 (ref 5–15)
BUN: 19 mg/dL (ref 8–23)
CO2: 26 mmol/L (ref 22–32)
Calcium: 8.6 mg/dL — ABNORMAL LOW (ref 8.9–10.3)
Chloride: 102 mmol/L (ref 98–111)
Creatinine, Ser: 0.57 mg/dL (ref 0.44–1.00)
GFR, Estimated: >60 mL/min (ref >60)
Glucose, Bld: 163 mg/dL — ABNORMAL HIGH (ref 70–99)
Potassium: 4.1 mmol/L (ref 3.5–5.1)
Sodium: 136 mmol/L (ref 135–145)

## 2022-02-02 LAB — ECHOCARDIOGRAM COMPLETE
AR max vel: 1.84 cm2
AV Area VTI: 2 cm2
AV Area mean vel: 1.93 cm2
AV Mean grad: 5 mmHg
AV Peak grad: 10.1 mmHg
Ao pk vel: 1.59 m/s
Area-P 1/2: 2.9 cm2
Height: 62.5 in
Weight: 2010.6 oz

## 2022-02-02 LAB — MAGNESIUM: Magnesium: 1.9 mg/dL (ref 1.7–2.4)

## 2022-02-02 MED ORDER — APIXABAN 5 MG PO TABS
5.0000 mg | ORAL_TABLET | Freq: Two times a day (BID) | ORAL | Status: DC
  Administered 2022-02-02: 5 mg via ORAL
  Filled 2022-02-02: qty 1

## 2022-02-02 MED ORDER — BISOPROLOL FUMARATE 5 MG PO TABS
15.0000 mg | ORAL_TABLET | Freq: Every day | ORAL | Status: DC
  Administered 2022-02-02: 15 mg via ORAL
  Filled 2022-02-02: qty 3

## 2022-02-02 NOTE — Progress Notes (Signed)
Patient discharging home. IV removed without complications. Tele removed and CCMD notified. Discharge instructions given and medication administration discussed. All questions answered. Waiting for ride. Tammie Martin

## 2022-02-02 NOTE — Progress Notes (Signed)
Progress Note  Patient Name: Tammie Martin Date of Encounter: 02/02/2022  [facility] Cardiologist: Carlyle Dolly, MD   Subjective   No chest pain or shortness of breath this morning.  No palpitations.  Some expected soreness at the subclavian incision site.  Otherwise no complaints.  Slept poorly last night.  Inpatient Medications    Scheduled Meds:  atorvastatin  20 mg Oral Daily   levothyroxine  88 mcg Oral Q0600   sodium chloride flush  3 mL Intravenous Q12H   venlafaxine XR  75 mg Oral Q breakfast   Continuous Infusions:  sodium chloride     nitroGLYCERIN     PRN Meds: sodium chloride, acetaminophen **OR** acetaminophen, ondansetron (ZOFRAN) IV, oxyCODONE, sodium chloride flush, traZODone   Vital Signs    Vitals:   02/01/22 1800 02/01/22 1957 02/02/22 0014 02/02/22 0412  BP: (!) 128/99 (!) 128/95 (!) 114/45 139/87  Pulse: 95 60 81 86  Resp: '20 20 20 20  '$ Temp:  97.8 F (36.6 C) 98 F (36.7 C) 97.9 F (36.6 C)  TempSrc:  Oral Oral Oral  SpO2: 91% 92% 90% 90%  Weight:    57 kg  Height:        Intake/Output Summary (Last 24 hours) at 02/02/2022 0751 Last data filed at 02/01/2022 1800 Gross per 24 hour  Intake 340 ml  Output 10 ml  Net 330 ml      02/02/2022    4:12 AM 02/01/2022    8:48 AM 01/28/2022    9:49 AM  Last 3 Weights  Weight (lbs) 125 lb 10.6 oz 120 lb 122 lb 4.8 oz  Weight (kg) 57 kg 54.432 kg 55.475 kg      Telemetry    Atrial fibrillation, heart rates 80-120s- Personally Reviewed  ECG    Atrial fibrillation 89 bpm, ST and T wave abnormality consider inferolateral ischemia - Personally Reviewed  Physical Exam  Alert, oriented, pleasant woman in no distress GEN: No acute distress.   Neck: No JVD Chest: Subclavian incision site on the right is intact with mild ecchymoses, no erythema or edema Cardiac: Irregularly irregular, no murmurs, rubs, or gallops.  Respiratory: Clear to auscultation bilaterally. GI: Soft, nontender,  non-distended  MS: No edema; No deformity.  Right groin site is clear with no hematoma or ecchymosis Neuro:  Nonfocal  Psych: Normal affect   Labs    High Sensitivity Troponin:  No results for input(s): TROPONINIHS in the last 720 hours.   Chemistry Recent Labs  Lab 01/28/22 1100 02/01/22 1146 02/01/22 1329 02/01/22 1443 02/02/22 0204  NA 140   < > 138 138 136  K 3.0*   < > 3.8 3.9 4.1  CL 98   < > 101 103 102  CO2 35*  --   --   --  26  GLUCOSE 103*   < > 108* 115* 163*  BUN 12   < > '16 16 19  '$ CREATININE 0.61   < > 0.30* 0.40* 0.57  CALCIUM 9.1  --   --   --  8.6*  MG  --   --   --   --  1.9  PROT 7.2  --   --   --   --   ALBUMIN 3.5  --   --   --   --   AST 34  --   --   --   --   ALT 37  --   --   --   --  ALKPHOS 112  --   --   --   --   BILITOT 0.6  --   --   --   --   GFRNONAA >60  --   --   --  >60  ANIONGAP 7  --   --   --  8   < > = values in this interval not displayed.    Lipids No results for input(s): CHOL, TRIG, HDL, LABVLDL, LDLCALC, CHOLHDL in the last 168 hours.  Hematology Recent Labs  Lab 01/28/22 1100 02/01/22 1146 02/01/22 1329 02/01/22 1443 02/02/22 0204  WBC 10.6*  --   --   --  10.3  RBC 4.52  --   --   --  3.85*  HGB 14.6   < > 12.2 14.3 12.6  HCT 45.1   < > 36.0 42.0 37.5  MCV 99.8  --   --   --  97.4  MCH 32.3  --   --   --  32.7  MCHC 32.4  --   --   --  33.6  RDW 14.0  --   --   --  13.7  PLT 314  --   --   --  194   < > = values in this interval not displayed.   Thyroid No results for input(s): TSH, FREET4 in the last 168 hours.  BNPNo results for input(s): BNP, PROBNP in the last 168 hours.  DDimer No results for input(s): DDIMER in the last 168 hours.   Radiology    ECHO TEE  Result Date: 02/01/2022    TRANSESOPHOGEAL ECHO REPORT   Patient Name:   Tammie Martin Date of Exam: 02/01/2022 Medical Rec #:  [id number]         Height:       62.5 in Accession #:    [id number]        Weight:       120.0 lb Date of Birth:   1951-03-15         BSA:          1.548 m Patient Age:    71 years          BP:           110/97 mmHg Patient Gender: F                 HR:           91 bpm. Exam Location:  Inpatient Procedure: Transesophageal Echo, Cardiac Doppler and Color Doppler Indications:     Aortic stenosis, severe  History:         Patient has prior history of Echocardiogram examinations. COPD,                  Aortic Valve Disease, Arrythmias:Atrial Fibrillation; Risk                  Factors:Hypertension, Dyslipidemia and Current Smoker. Thyroid                  disease.                  Aortic Valve: 23 mm Sapien prosthetic, stented (TAVR) valve is                  present in the aortic position. Procedure Date: 02/01/2022.  Sonographer:     Darlina Sicilian RDCS Referring Phys:  Lydia Diagnosing Phys: Eleonore Chiquito MD PROCEDURE: After discussion  of the risks and benefits of a TEE, an informed consent was obtained from the patient. The patient was intubated. The transesophogeal probe was passed without difficulty through the esophogus of the patient. Imaged were obtained with the patient in a supine position. Sedation performed by different physician. The patient was monitored while under deep sedation. Anesthestetic sedation was provided intravenously by Anesthesiology: '90mg'$  of Propofol, '60mg'$  of Lidocaine. Image quality was excellent. The patient's vital signs; including heart rate, blood pressure, and oxygen saturation; remained stable throughout the procedure. The patient developed no complications during the procedure. IMPRESSIONS  1. TEE guided TAVR. 23 mm S3 deployed. Vmax 1.2 m/s, MG 2.0 mmHG, EOA 2.77 cm2, DI 0.80. Trivial PVL in the 12-1 o'clock position. Normal prosthesis. The aortic valve has been repaired/replaced. Aortic valve regurgitation is trivial. There is a 23 mm Sapien prosthetic (TAVR) valve present in the aortic position. Procedure Date: 02/01/2022.  2. LAA sludge present. Low LAA velocity noted ~22  cm/s. Left atrial size was severely dilated. A left atrial/left atrial appendage thrombus was detected.  3. Left ventricular ejection fraction, by estimation, is 60 to 65%. The left ventricle has normal function.  4. Right ventricular systolic function is normal. The right ventricular size is mildly enlarged.  5. Right atrial size was severely dilated.  6. The mitral valve is degenerative. Mild mitral valve regurgitation. No evidence of mitral stenosis. Moderate to severe mitral annular calcification.  7. Tricuspid valve regurgitation is mild to moderate. FINDINGS  Left Ventricle: Left ventricular ejection fraction, by estimation, is 60 to 65%. The left ventricle has normal function. The left ventricular internal cavity size was normal in size. Right Ventricle: The right ventricular size is mildly enlarged. No increase in right ventricular wall thickness. Right ventricular systolic function is normal. Left Atrium: LAA sludge present. Low LAA velocity noted ~22 cm/s. Left atrial size was severely dilated. A left atrial/left atrial appendage thrombus was detected. Right Atrium: Right atrial size was severely dilated. Pericardium: Trivial pericardial effusion is present. Mitral Valve: The mitral valve is degenerative in appearance. Moderate to severe mitral annular calcification. Mild mitral valve regurgitation. No evidence of mitral valve stenosis. Tricuspid Valve: The tricuspid valve is grossly normal. Tricuspid valve regurgitation is mild to moderate. No evidence of tricuspid stenosis. 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 Vmax 1.2 m/s, MG 2.0 mmHG, EOA 2.77 cm2, DI 0.80. Trivial PVL in the 12-1 o'clock position. Normal prosthesis. The aortic valve has been repaired/replaced. Aortic valve regurgitation is trivial. Aortic regurgitation PHT measures 693 msec. Aortic valve mean gradient measures 2.0 mmHg. Aortic valve peak gradient measures 5.7 mmHg. Aortic valve area, by VTI measures 2.77 cm. There is a  23 mm Sapien prosthetic, stented (TAVR) valve present in the aortic position. Procedure Date: 02/01/2022. Pulmonic Valve: The pulmonic valve was grossly normal. Pulmonic valve regurgitation is not visualized. No evidence of pulmonic stenosis. Aorta: The aortic root and ascending aorta are structurally normal, with no evidence of dilitation. There is minimal (Grade I) layered plaque involving the descending aorta. IAS/Shunts: The atrial septum is grossly normal.  LEFT VENTRICLE PLAX 2D LVOT diam:     2.10 cm LV SV:         59 LV SV Index:   38 LVOT Area:     3.46 cm  AORTIC VALVE AV Area (Vmax):    2.47 cm AV Area (Vmean):   1.22 cm AV Area (VTI):     2.77 cm AV Vmax:  119.00 cm/s AV Vmean:          158.100 cm/s AV VTI:            0.211 m AV Peak Grad:      5.7 mmHg AV Mean Grad:      2.0 mmHg LVOT Vmax:         84.70 cm/s LVOT Vmean:        55.900 cm/s LVOT VTI:          0.169 m LVOT/AV VTI ratio: 0.80 AI PHT:            693 msec  AORTA Ao Root diam: 3.14 cm Ao Asc diam:  2.90 cm TRICUSPID VALVE TR Peak grad:   27.5 mmHg TR Vmax:        262.00 cm/s  SHUNTS Systemic VTI:  0.17 m Systemic Diam: 2.10 cm Eleonore Chiquito MD Electronically signed by Eleonore Chiquito MD Signature Date/Time: 02/01/2022/2:26:22 PM    Final    Structural Heart Procedure  Result Date: 02/01/2022 See surgical note for result.   Cardiac Studies   Postoperative day 1 echo pending  Patient Profile     71 y.o. female with longstanding tobacco use and persistent atrial fibrillation who has developed severe, symptomatic paradoxical low-flow low gradient aortic stenosis, presents 02/01/2022 for TAVR  Assessment & Plan    1.  Severe paradoxical low-flow low gradient aortic stenosis: Patient doing well postoperative day #1 after TAVR from right transaxillary access.  We will review 2D echo when completed.  No murmur on her exam.  Patient appears medically stable for discharge.  I reviewed postoperative instructions with her.  She  understands not to drive for 1 week.  Follow-up is scheduled with cardiology APP in 1 week.  Resume apixaban this morning for atrial fibrillation. 2.  Persistent atrial fibrillation: Heart rate moderately elevated today.  Resume bisoprolol this morning.  Resume apixaban this morning.  Follow-up 1 week.  The patient appears medically stable for discharge.  Follow-up as outlined.  For questions or updates, please contact [place] Please consult [email or web address] for contact info under        Signed, Sherren Mocha, MD  02/02/2022, 7:51 AM

## 2022-02-02 NOTE — TOC Transition Note (Signed)
Transition of Care (TOC) - CM/SW Discharge Note Marvetta Gibbons RN, BSN Transitions of Care Unit 4E- RN Case Manager See Treatment Team for direct phone #    Patient Details  Name: Tammie Martin MRN: [id number] Date of Birth: 1951/06/03  Transition of Care [facility]) CM/SW Contact:  Dawayne Patricia, RN Phone Number: 02/02/2022, 1:58 PM   Clinical Narrative:    Pt s/p TAVR, stable for transition home today, Transition of Care Department [facility]) has reviewed patient and no TOC needs have been identified at this time.  Pt awaiting transportation home by family.   Final next level of care: Home/Self Care Barriers to Discharge: No Barriers Identified   Patient Goals and CMS Choice     Choice offered to / list presented to : NA  Discharge Placement                 Home      Discharge Plan and Services In-house Referral: NA Discharge Planning Services: NA Post Acute Care Choice: NA          DME Arranged: N/A DME Agency: NA       HH Arranged: NA HH Agency: NA        Social Determinants of Health (SDOH) Interventions     Readmission Risk Interventions    02/02/2022    1:58 PM  Readmission Risk Prevention Plan  Post Dischage Appt Complete  Medication Screening Complete  Transportation Screening Complete

## 2022-02-02 NOTE — Progress Notes (Signed)
CARDIAC REHAB PHASE I   PRE:  Rate/Rhythm: 100 afib    BP: sitting 125/79    SaO2: 98 RA  MODE:  Ambulation: 430 ft   POST:  Rate/Rhythm: 117 afib    BP: sitting 150/81     SaO2: 90-91 RA  Tolerated well, no c/o. Likes to walk quickly. Long discussion of smoking cessation. Pt wants to work on cut down to quit method. Discussed restrictions, exercise, and CRPII. Pt voiced understanding, wants to go to exercise program near her house instead of CRPII.  [id number]   [place], ACSM 02/02/2022 10:01 AM

## 2022-02-02 NOTE — Progress Notes (Signed)
Mobility Specialist: Progress Note   02/02/22 1229  Mobility  Activity Ambulated independently in hallway  Level of Assistance Independent  Assistive Device None  Distance Ambulated (ft) 360 ft  Activity Response Tolerated well  $Mobility charge 1 Mobility   Post-Mobility: 77 HR  Received pt in bed having no complaints and agreeable to mobility. Asymptomatic throughout ambulation, sitting EOB w/ call bell in reach and all needs met.  [facility] Garold Sheeler Mobility Specialist Mobility Specialist 5 North: [phone] Mobility Specialist 6 North: [phone]

## 2022-02-02 NOTE — Discharge Summary (Signed)
Discharge Summary    Patient ID: LODA BIALAS MRN: [id number]; DOB: 1950/12/07  Admit date: 02/01/2022 Discharge date: 02/02/2022  PCP:  Janora Norlander, DO   [place] [facility] Cardiologist:  Carlyle Dolly, MD      Discharge Diagnoses    Principal Problem:   S/P TAVR (transcatheter aortic valve replacement) Active Problems:   Essential hypertension, benign   Hyperlipidemia LDL goal <130   Hypothyroidism   Generalized anxiety disorder   Severe aortic stenosis   Tobacco abuse   History of cervical cancer    Diagnostic Studies/Procedures    Echo TEE 02/01/22   1. TEE guided TAVR. 23 mm S3 deployed. Vmax 1.2 m/s, MG 2.0 mmHG, EOA  2.77 cm2, DI 0.80. Trivial PVL in the 12-1 o'clock position. Normal  prosthesis. The aortic valve has been repaired/replaced. Aortic valve  regurgitation is trivial. There is a 23 mm  Sapien prosthetic (TAVR) valve present in the aortic position. Procedure  Date: 02/01/2022.   2. LAA sludge present. Low LAA velocity noted ~22 cm/s. Left atrial size  was severely dilated. A left atrial/left atrial appendage thrombus was  detected.   3. Left ventricular ejection fraction, by estimation, is 60 to 65%. The  left ventricle has normal function.   4. Right ventricular systolic function is normal. The right ventricular  size is mildly enlarged.   5. Right atrial size was severely dilated.   6. The mitral valve is degenerative. Mild mitral valve regurgitation. No  evidence of mitral stenosis. Moderate to severe mitral annular  calcification.   7. Tricuspid valve regurgitation is mild to moderate.   Echocardiogram 02/02/22  1. 23 mm S3. Mild paravalvular leak. Vmax 1.6 m/s, MG 5.0 mmHG, EOA 2.0  cm2, DI 0.55. Prosthesis within normal limits. The aortic valve has been  repaired/replaced. Aortic valve regurgitation is mild. There is a 23 mm  Sapien prosthetic (TAVR) valve  present in the aortic position. Procedure Date: 01/01/2022.   2.  Left ventricular ejection fraction, by estimation, is 60 to 65%. The  left ventricle has normal function. The left ventricle has no regional  wall motion abnormalities. Left ventricular diastolic function could not  be evaluated.   3. Right ventricular systolic function is normal. The right ventricular  size is mildly enlarged. There is severely elevated pulmonary artery  systolic pressure.   4. Left atrial size was severely dilated.   5. Right atrial size was severely dilated.   6. The mitral valve is degenerative. Trivial mitral valve regurgitation.  Mild mitral stenosis. The mean mitral valve gradient is 4.0 mmHg with  average heart rate of 80 bpm. Moderate to severe mitral annular  calcification.   7. The inferior vena cava is dilated in size with <50% respiratory  variability, suggesting right atrial pressure of 15 mmHg.  _____________   History of Present Illness     Tammie Martin is a 71 y.o. female with a past medical history of HTN, HLD, ongoing tobacco abuse, atrial fibrillation, and severe aortic stenosis. Patient is followed by Dr. [name]. She was first diagnosed with a heart murmur in 2017 and was found to have mild AS at that time. Patient was diagnosed with atrial fibrillation in 09/2021, and underwent echocardiogram on 10/20/21 that showed EF 60-65%, mild LVH, low normal RV systolic function, severely elevated pulmonary artery systolic pressure, and moderate to severe low flow low gradient aortic stenosis. Patient underwent elective cardioversion for her atrial fibrillation, remained in SR for about a week,  and then had recurrence of afib. Her atrial fibrillation will be managed medically with a rate control/anticoagulation strategy.   Patient was referred to Dr. Burt Knack for evaluation of aortic stenosis. Seen by Dr. Burt Knack on 11/30/21. At that appointment, patient reported she was having progressive symptoms including limitation with exertional dyspnea, occasional chest burning.  Due to these symptoms and her echocardiogram findings, patient was scheduled for TAVR. Underwent TAVR on 02/01/22.  Hospital Course     Consultants: CT surgery   Severe paradoxical low-flow low gradient aortic stenosis:  - Patient doing well postoperative day #1 after TAVR from right transaxillary access.   - No murmur on her exam. Echo reviewed by Dr. Burt Knack - Patient appears medically stable for discharge.  - Dr. Burt Knack reviewed postoperative instructions with her.  She understands not to drive for 1 week.   - Follow-up is scheduled with cardiology APP in 1 week.   - Resume apixaban for atrial fibrillation.  Persistent atrial fibrillation:  - Heart rate moderately elevated today.   - Resume bisoprolol this morning.   - Resume apixaban this morning.   - Follow-up 1 week  Patient was seen and examined by Dr. Burt Knack and deemed stable for discharge.   Patient has a follow up appointment with [facility] on 6/16  Did the patient have an acute coronary syndrome (MI, NSTEMI, STEMI, etc) this admission?:  No                               Did the patient have a percutaneous coronary intervention (stent / angioplasty)?:  No.          _____________  Discharge Vitals Blood pressure (!) 147/88, pulse 84, temperature (!) 97.5 F (36.4 C), temperature source Oral, resp. rate 20, height 5' 2.5" (1.588 m), weight 57 kg, SpO2 95 %.  Filed Weights   02/01/22 0848 02/02/22 0412  Weight: 54.4 kg 57 kg    Labs & Radiologic Studies    CBC Recent Labs    02/01/22 1443 02/02/22 0204  WBC  --  10.3  HGB 14.3 12.6  HCT 42.0 37.5  MCV  --  97.4  PLT  --  921   Basic Metabolic Panel Recent Labs    02/01/22 1443 02/02/22 0204  NA 138 136  K 3.9 4.1  CL 103 102  CO2  --  26  GLUCOSE 115* 163*  BUN 16 19  CREATININE 0.40* 0.57  CALCIUM  --  8.6*  MG  --  1.9   Liver Function Tests No results for input(s): AST, ALT, ALKPHOS, BILITOT, PROT, ALBUMIN in the last 72  hours. No results for input(s): LIPASE, AMYLASE in the last 72 hours. High Sensitivity Troponin:   No results for input(s): TROPONINIHS in the last 720 hours.  BNP Invalid input(s): POCBNP D-Dimer No results for input(s): DDIMER in the last 72 hours. Hemoglobin A1C No results for input(s): HGBA1C in the last 72 hours. Fasting Lipid Panel No results for input(s): CHOL, HDL, LDLCALC, TRIG, CHOLHDL, LDLDIRECT in the last 72 hours. Thyroid Function Tests No results for input(s): TSH, T4TOTAL, T3FREE, THYROIDAB in the last 72 hours.  Invalid input(s): FREET3 _____________  DG Chest 2 View  Result Date: 01/30/2022 CLINICAL DATA:  Preop for aortic valve replacement. EXAM: CHEST - 2 VIEW COMPARISON:  Two-view chest x-ray a E 09/10/2021 FINDINGS: The heart is enlarged. No edema or effusion is present. Mitral annular calcifications  are present. Changes of COPD noted. No edema or effusion is present. No focal airspace disease is present. Bilateral breast implants noted. IMPRESSION: Cardiomegaly without failure. Electronically Signed   By: San Morelle M.D.   On: 01/30/2022 07:24   ECHOCARDIOGRAM COMPLETE  Result Date: 02/02/2022    ECHOCARDIOGRAM REPORT   Patient Name:   MATTILYN CRITES Date of Exam: 02/02/2022 Medical Rec #:  [id number]         Height:       62.5 in Accession #:    [id number]        Weight:       125.7 lb Date of Birth:  [birth year]         BSA:          1.578 m Patient Age:    65 years          BP:           137/87 mmHg Patient Gender: F                 HR:           95 bpm. 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 COPD, Aortic Valve Disease, Arrythmias:Atrial                 Fibrillation; Risk Factors:Hypertension, Dyslipidemia and                 Current Smoker. Thyroid disease.                  Aortic Valve: 23 mm Sapien prosthetic, stented (TAVR) valve is                 present in the aortic position. Procedure Date: 01/01/2022.  Sonographer:    Darlina Sicilian RDCS Referring Phys: [id number] KATHRYN R THOMPSON IMPRESSIONS  1. 23 mm S3. Mild paravalvular leak. Vmax 1.6 m/s, MG 5.0 mmHG, EOA 2.0 cm2, DI 0.55. Prosthesis within normal limits. The aortic valve has been repaired/replaced. Aortic valve regurgitation is mild. There is a 23 mm Sapien prosthetic (TAVR) valve present in the aortic position. Procedure Date: 01/01/2022.  2. Left ventricular ejection fraction, by estimation, is 60 to 65%. The left ventricle has normal function. The left ventricle has no regional wall motion abnormalities. Left ventricular diastolic function could not be evaluated.  3. Right ventricular systolic function is normal. The right ventricular size is mildly enlarged. There is severely elevated pulmonary artery systolic pressure.  4. Left atrial size was severely dilated.  5. Right atrial size was severely dilated.  6. The mitral valve is degenerative. Trivial mitral valve regurgitation. Mild mitral stenosis. The mean mitral valve gradient is 4.0 mmHg with average heart rate of 80 bpm. Moderate to severe mitral annular calcification.  7. The inferior vena cava is dilated in size with <50% respiratory variability, suggesting right atrial pressure of 15 mmHg. FINDINGS  Left Ventricle: Left ventricular ejection fraction, by estimation, is 60 to 65%. The left ventricle has normal function. The left ventricle has no regional wall motion abnormalities. The left ventricular internal cavity size was normal in size. There is  no left ventricular hypertrophy. Left ventricular diastolic function could not be evaluated  due to atrial fibrillation. Left ventricular diastolic function could not be evaluated. Right Ventricle: The right ventricular size is mildly enlarged. No increase in right ventricular wall thickness. Right  ventricular systolic function is normal. There is severely elevated pulmonary artery systolic pressure. The tricuspid regurgitant velocity is 3.62 m/s, and with an assumed right atrial pressure of 15 mmHg, the estimated right ventricular systolic pressure is 53.9 mmHg. Left Atrium: Left atrial size was severely dilated. Right Atrium: Right atrial size was severely dilated. Pericardium: There is no evidence of pericardial effusion. Mitral Valve: The mitral valve is degenerative in appearance. Moderate to severe mitral annular calcification. Trivial mitral valve regurgitation. Mild mitral valve stenosis. MV peak gradient, 7.7 mmHg. The mean mitral valve gradient is 4.0 mmHg with average heart rate of 80 bpm. Tricuspid Valve: The tricuspid valve is grossly normal. Tricuspid valve regurgitation is mild. Aortic Valve: 23 mm S3. Mild paravalvular leak. Vmax 1.6 m/s, MG 5.0 mmHG, EOA 2.0 cm2, DI 0.55. Prosthesis within normal limits. The aortic valve has been repaired/replaced. Aortic valve regurgitation is mild. Aortic valve mean gradient measures 5.0 mmHg. Aortic valve peak gradient measures 10.1 mmHg. Aortic valve area, by VTI measures 2.00 cm. There is a 23 mm Sapien prosthetic, stented (TAVR) valve present in the aortic position. Procedure Date: 01/01/2022. Pulmonic Valve: The pulmonic valve was grossly normal. Pulmonic valve regurgitation is trivial. No evidence of pulmonic stenosis. Aorta: The aortic root and ascending aorta are structurally normal, with no evidence of dilitation. Venous: The inferior vena cava is dilated in size with less than 50% respiratory variability, suggesting right atrial pressure of 15 mmHg. IAS/Shunts: The atrial septum is grossly normal.  LEFT VENTRICLE PLAX 2D LVOT diam:     2.15 cm   Diastology LV SV:         61        LV e' medial:    3.94 cm/s LV SV Index:   39        LV E/e' medial:  32.0 LVOT Area:     3.63 cm  LV e' lateral:   11.20 cm/s                          LV E/e' lateral:  11.3  RIGHT VENTRICLE RV S prime:     9.38 cm/s TAPSE (M-mode): 2.0 cm LEFT ATRIUM              Index        RIGHT ATRIUM           Index LA Vol (A2C):   129.0 ml 81.73 ml/m  RA Area:     31.20 cm LA Vol (A4C):   128.0 ml 81.09 ml/m  RA Volume:   100.00 ml 63.35 ml/m LA Biplane Vol: 130.0 ml 82.36 ml/m  AORTIC VALVE AV Area (Vmax):    1.84 cm AV Area (Vmean):   1.93 cm AV Area (VTI):     2.00 cm AV Vmax:           159.00 cm/s AV Vmean:          106.500 cm/s AV VTI:            0.306 m AV Peak Grad:      10.1 mmHg AV Mean Grad:      5.0 mmHg LVOT Vmax:         80.40 cm/s LVOT Vmean:        56.650 cm/s LVOT VTI:  0.168 m LVOT/AV VTI ratio: 0.55  AORTA Ao Asc diam: 3.15 cm MITRAL VALVE                TRICUSPID VALVE MV Area (PHT): 2.90 cm     TR Peak grad:   52.4 mmHg MV Peak grad:  7.7 mmHg     TR Vmax:        362.00 cm/s MV Mean grad:  4.0 mmHg MV Vmax:       1.39 m/s     SHUNTS MV Vmean:      89.0 cm/s    Systemic VTI:  0.17 m MV Decel Time: 262 msec     Systemic Diam: 2.15 cm MV E velocity: 126.00 cm/s Eleonore Chiquito MD Electronically signed by Eleonore Chiquito MD Signature Date/Time: 02/02/2022/11:47:25 AM    Final    ECHO TEE  Result Date: 02/01/2022    TRANSESOPHOGEAL ECHO REPORT   Patient Name:   REDITH DRACH Date of Exam: 02/01/2022 Medical Rec #:  [id number]         Height:       62.5 in Accession #:    [id number]        Weight:       120.0 lb Date of Birth:  1951/05/02         BSA:          1.548 m Patient Age:    64 years          BP:           110/97 mmHg Patient Gender: F                 HR:           91 bpm. Exam Location:  Inpatient Procedure: Transesophageal Echo, Cardiac Doppler and Color Doppler Indications:     Aortic stenosis, severe  History:         Patient has prior history of Echocardiogram examinations. COPD,                  Aortic Valve Disease, Arrythmias:Atrial Fibrillation; Risk                  Factors:Hypertension, Dyslipidemia and Current Smoker. Thyroid                   disease.                  Aortic Valve: 23 mm Sapien prosthetic, stented (TAVR) valve is                  present in the aortic position. Procedure Date: 02/01/2022.  Sonographer:     Darlina Sicilian RDCS Referring Phys:  [place] Diagnosing Phys: Eleonore Chiquito MD PROCEDURE: After discussion of the risks and benefits of a TEE, an informed consent was obtained from the patient. The patient was intubated. The transesophogeal probe was passed without difficulty through the esophogus of the patient. Imaged were obtained with the patient in a supine position. Sedation performed by different physician. The patient was monitored while under deep sedation. Anesthestetic sedation was provided intravenously by Anesthesiology: '90mg'$  of Propofol, '60mg'$  of Lidocaine. Image quality was excellent. The patient's vital signs; including heart rate, blood pressure, and oxygen saturation; remained stable throughout the procedure. The patient developed no complications during the procedure. IMPRESSIONS  1. TEE guided TAVR. 23 mm S3 deployed. Vmax 1.2 m/s, MG 2.0 mmHG, EOA 2.77 cm2, DI 0.80. Trivial PVL in  the 12-1 o'clock position. Normal prosthesis. The aortic valve has been repaired/replaced. Aortic valve regurgitation is trivial. There is a 23 mm Sapien prosthetic (TAVR) valve present in the aortic position. Procedure Date: 02/01/2022.  2. LAA sludge present. Low LAA velocity noted ~22 cm/s. Left atrial size was severely dilated. A left atrial/left atrial appendage thrombus was detected.  3. Left ventricular ejection fraction, by estimation, is 60 to 65%. The left ventricle has normal function.  4. Right ventricular systolic function is normal. The right ventricular size is mildly enlarged.  5. Right atrial size was severely dilated.  6. The mitral valve is degenerative. Mild mitral valve regurgitation. No evidence of mitral stenosis. Moderate to severe mitral annular calcification.  7. Tricuspid valve regurgitation is mild  to moderate. FINDINGS  Left Ventricle: Left ventricular ejection fraction, by estimation, is 60 to 65%. The left ventricle has normal function. The left ventricular internal cavity size was normal in size. Right Ventricle: The right ventricular size is mildly enlarged. No increase in right ventricular wall thickness. Right ventricular systolic function is normal. Left Atrium: LAA sludge present. Low LAA velocity noted ~22 cm/s. Left atrial size was severely dilated. A left atrial/left atrial appendage thrombus was detected. Right Atrium: Right atrial size was severely dilated. Pericardium: Trivial pericardial effusion is present. Mitral Valve: The mitral valve is degenerative in appearance. Moderate to severe mitral annular calcification. Mild mitral valve regurgitation. No evidence of mitral valve stenosis. Tricuspid Valve: The tricuspid valve is grossly normal. Tricuspid valve regurgitation is mild to moderate. No evidence of tricuspid stenosis. 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 Vmax 1.2 m/s, MG 2.0 mmHG, EOA 2.77 cm2, DI 0.80. Trivial PVL in the 12-1 o'clock position. Normal prosthesis. The aortic valve has been repaired/replaced. Aortic valve regurgitation is trivial. Aortic regurgitation PHT measures 693 msec. Aortic valve mean gradient measures 2.0 mmHg. Aortic valve peak gradient measures 5.7 mmHg. Aortic valve area, by VTI measures 2.77 cm. There is a 23 mm Sapien prosthetic, stented (TAVR) valve present in the aortic position. Procedure Date: 02/01/2022. Pulmonic Valve: The pulmonic valve was grossly normal. Pulmonic valve regurgitation is not visualized. No evidence of pulmonic stenosis. Aorta: The aortic root and ascending aorta are structurally normal, with no evidence of dilitation. There is minimal (Grade I) layered plaque involving the descending aorta. IAS/Shunts: The atrial septum is grossly normal.  LEFT VENTRICLE PLAX 2D LVOT diam:     2.10 cm LV SV:         59 LV SV Index:   38  LVOT Area:     3.46 cm  AORTIC VALVE AV Area (Vmax):    2.47 cm AV Area (Vmean):   1.22 cm AV Area (VTI):     2.77 cm AV Vmax:           119.00 cm/s AV Vmean:          158.100 cm/s AV VTI:            0.211 m AV Peak Grad:      5.7 mmHg AV Mean Grad:      2.0 mmHg LVOT Vmax:         84.70 cm/s LVOT Vmean:        55.900 cm/s LVOT VTI:          0.169 m LVOT/AV VTI ratio: 0.80 AI PHT:            693 msec  AORTA Ao Root diam: 3.14 cm Ao Asc diam:  2.90 cm TRICUSPID VALVE TR Peak grad:   27.5 mmHg TR Vmax:        262.00 cm/s  SHUNTS Systemic VTI:  0.17 m Systemic Diam: 2.10 cm Eleonore Chiquito MD Electronically signed by Eleonore Chiquito MD Signature Date/Time: 02/01/2022/2:26:22 PM    Final    Structural Heart Procedure  Result Date: 02/01/2022 See surgical note for result.   Disposition   Pt is being discharged home today in good condition.  Follow-up Plans & Appointments     Follow-up Information     Eileen Stanford, PA-C. Go on 02/11/2022.   Specialties: Cardiology, Radiology Why: @ 11am, please arrive at least 10 minutes early Contact information: [place] [address] [phone]                Discharge Instructions     Diet - low sodium heart healthy   Complete by: As directed    If the dressing is still on your incision site when you go home, remove it on the third day after your surgery date. Remove dressing if it begins to fall off, or if it is dirty or damaged before the third day.   Complete by: As directed    Increase activity slowly   Complete by: As directed        Discharge Medications   Allergies as of 02/02/2022       Reactions   Penicillins Hives   Singulair [montelukast Sodium]    headache   Codeine Rash   Nicoderm [nicotine] Hives, Swelling   Other Rash   Band-aids cause rash after a few days        Medication List     TAKE these medications    acetaminophen 500 MG tablet Commonly known as: TYLENOL Take 1,000  mg by mouth every 6 (six) hours as needed for moderate pain.   albuterol 108 (90 Base) MCG/ACT inhaler Commonly known as: VENTOLIN HFA Inhale 2 puffs into the lungs every 6 (six) hours as needed for wheezing or shortness of breath.   atorvastatin 20 MG tablet Commonly known as: LIPITOR Take 1 tablet (20 mg total) by mouth daily.   bisoprolol 10 MG tablet Commonly known as: ZEBETA Take 1.5 tablets (15 mg total) by mouth daily.   CALCIUM-MAGNESIUM PO Take 1 tablet by mouth daily.   cetirizine 10 MG tablet Commonly known as: ZYRTEC Take 10 mg by mouth daily.   desvenlafaxine 50 MG 24 hr tablet Commonly known as: Pristiq Take 1 tablet (50 mg total) by mouth daily.   Eliquis 5 MG Tabs tablet Generic drug: apixaban Take 1 tablet (5 mg total) by mouth 2 (two) times daily.   fluticasone 50 MCG/ACT nasal spray Commonly known as: FLONASE Place 2 sprays into both nostrils daily as needed for allergies or rhinitis.   furosemide 20 MG tablet Commonly known as: LASIX Take 1 tablet (20 mg total) by mouth daily.   hydrocortisone cream 1 % Apply 1 application. topically 2 (two) times daily as needed (rash).   levothyroxine 88 MCG tablet Commonly known as: SYNTHROID Take 1 tablet by mouth daily.   Omega-3-6-9 Caps Take 1 capsule by mouth 2 (two) times daily.   potassium chloride 10 MEQ tablet Commonly known as: KLOR-CON M Take 1 tablet (10 mEq total) by mouth daily.   PRESERVISION AREDS 2 PO Take 1 capsule by mouth in the morning and at bedtime.   ALIVE WOMENS 50+ PO Take 1 tablet by mouth  daily.   PROBIOTIC ADVANCED PO Take 1 capsule by mouth daily.   SYSTANE OP Place 1 drop into both eyes daily as needed (dry eyes).   tobramycin 0.3 % ophthalmic solution Commonly known as: TOBREX Place 1 drop into the right eye See admin instructions. Instill 1 drop into the right eye three times daily, the day before, day of and day after eye injections   traZODone 50 MG  tablet Commonly known as: DESYREL Take 0.5-1 tablets (25-50 mg total) by mouth at bedtime as needed for sleep. What changed: how much to take   TURMERIC PO Take 1 capsule by mouth 2 (two) times daily.   umeclidinium-vilanterol 62.5-25 MCG/ACT Aepb Commonly known as: ANORO ELLIPTA Inhale 1 puff into the lungs daily at 6 (six) AM.   Vitamin D 50 MCG (2000 UT) tablet Take 2,000 Units by mouth daily.               Discharge Care Instructions  (From admission, onward)           Start     Ordered   02/02/22 0000  If the dressing is still on your incision site when you go home, remove it on the third day after your surgery date. Remove dressing if it begins to fall off, or if it is dirty or damaged before the third day.        02/02/22 1300               Outstanding Labs/Studies    Duration of Discharge Encounter   Greater than 30 minutes including physician time.  Signed, Margie Billet, PA-C 02/02/2022, 1:00 PM

## 2022-02-02 NOTE — Progress Notes (Signed)
Echocardiogram 2D Echocardiogram has been performed.  Darlina Sicilian M 02/02/2022, 9:10 AM

## 2022-02-03 ENCOUNTER — Telehealth: Payer: Self-pay

## 2022-02-03 MED FILL — Potassium Chloride Inj 2 mEq/ML: INTRAVENOUS | Qty: 40 | Status: AC

## 2022-02-03 MED FILL — Heparin Sodium (Porcine) Inj 1000 Unit/ML: Qty: 1000 | Status: AC

## 2022-02-03 MED FILL — Magnesium Sulfate Inj 50%: INTRAMUSCULAR | Qty: 10 | Status: AC

## 2022-02-03 NOTE — Telephone Encounter (Signed)
Patient contacted regarding discharge from [facility] on 02/02/2022.  Patient understands to follow up with [facility] on 02/11/2022 at 11:00 AM at [facility] office. Patient understands discharge instructions? yes Patient understands medications and regiment? yes Patient understands to bring all medications to this visit? Yes  Overall the pt is doing well.  She does complain of soreness at surgical site but this is better today than it was yesterday.  I reminded the pt that she can take tylenol for soreness.  The pt will contact the office with any additional questions or concerns.

## 2022-02-03 NOTE — Telephone Encounter (Signed)
TOC call attempted, left message for pt to call back.

## 2022-02-04 ENCOUNTER — Encounter (HOSPITAL_COMMUNITY): Payer: Self-pay | Admitting: Emergency Medicine

## 2022-02-04 ENCOUNTER — Emergency Department (HOSPITAL_COMMUNITY): Payer: Medicare HMO

## 2022-02-04 ENCOUNTER — Other Ambulatory Visit: Payer: Self-pay

## 2022-02-04 ENCOUNTER — Inpatient Hospital Stay (HOSPITAL_COMMUNITY)
Admission: EM | Admit: 2022-02-04 | Discharge: 2022-02-06 | DRG: 064 | Disposition: A | Discharge status: Home Health | Payer: Medicare HMO | Attending: Internal Medicine | Admitting: Internal Medicine

## 2022-02-04 ENCOUNTER — Telehealth: Payer: Self-pay | Admitting: Cardiovascular Disease

## 2022-02-04 DIAGNOSIS — R29818 Other symptoms and signs involving the nervous system: Secondary | ICD-10-CM | POA: Diagnosis not present

## 2022-02-04 DIAGNOSIS — Z79899 Other long term (current) drug therapy: Secondary | ICD-10-CM | POA: Diagnosis not present

## 2022-02-04 DIAGNOSIS — R599 Enlarged lymph nodes, unspecified: Secondary | ICD-10-CM | POA: Diagnosis present

## 2022-02-04 DIAGNOSIS — I4819 Other persistent atrial fibrillation: Secondary | ICD-10-CM | POA: Diagnosis present

## 2022-02-04 DIAGNOSIS — Z885 Allergy status to narcotic agent status: Secondary | ICD-10-CM

## 2022-02-04 DIAGNOSIS — I5032 Chronic diastolic (congestive) heart failure: Secondary | ICD-10-CM | POA: Diagnosis not present

## 2022-02-04 DIAGNOSIS — H538 Other visual disturbances: Secondary | ICD-10-CM | POA: Diagnosis present

## 2022-02-04 DIAGNOSIS — I6523 Occlusion and stenosis of bilateral carotid arteries: Secondary | ICD-10-CM | POA: Diagnosis not present

## 2022-02-04 DIAGNOSIS — I10 Essential (primary) hypertension: Secondary | ICD-10-CM | POA: Diagnosis not present

## 2022-02-04 DIAGNOSIS — I110 Hypertensive heart disease with heart failure: Secondary | ICD-10-CM | POA: Diagnosis present

## 2022-02-04 DIAGNOSIS — I714 Abdominal aortic aneurysm, without rupture, unspecified: Secondary | ICD-10-CM | POA: Diagnosis not present

## 2022-02-04 DIAGNOSIS — Z79891 Long term (current) use of opiate analgesic: Secondary | ICD-10-CM

## 2022-02-04 DIAGNOSIS — F419 Anxiety disorder, unspecified: Secondary | ICD-10-CM | POA: Diagnosis not present

## 2022-02-04 DIAGNOSIS — G9341 Metabolic encephalopathy: Secondary | ICD-10-CM | POA: Diagnosis not present

## 2022-02-04 DIAGNOSIS — E785 Hyperlipidemia, unspecified: Secondary | ICD-10-CM | POA: Diagnosis present

## 2022-02-04 DIAGNOSIS — R471 Dysarthria and anarthria: Secondary | ICD-10-CM | POA: Diagnosis present

## 2022-02-04 DIAGNOSIS — J439 Emphysema, unspecified: Secondary | ICD-10-CM | POA: Diagnosis not present

## 2022-02-04 DIAGNOSIS — I639 Cerebral infarction, unspecified: Secondary | ICD-10-CM | POA: Diagnosis not present

## 2022-02-04 DIAGNOSIS — G9340 Encephalopathy, unspecified: Principal | ICD-10-CM

## 2022-02-04 DIAGNOSIS — I6310 Cerebral infarction due to embolism of unspecified precerebral artery: Secondary | ICD-10-CM | POA: Diagnosis not present

## 2022-02-04 DIAGNOSIS — Z880 Allergy status to penicillin: Secondary | ICD-10-CM

## 2022-02-04 DIAGNOSIS — I4891 Unspecified atrial fibrillation: Secondary | ICD-10-CM | POA: Diagnosis not present

## 2022-02-04 DIAGNOSIS — Z8541 Personal history of malignant neoplasm of cervix uteri: Secondary | ICD-10-CM

## 2022-02-04 DIAGNOSIS — F32A Depression, unspecified: Secondary | ICD-10-CM | POA: Diagnosis present

## 2022-02-04 DIAGNOSIS — I6503 Occlusion and stenosis of bilateral vertebral arteries: Secondary | ICD-10-CM | POA: Diagnosis not present

## 2022-02-04 DIAGNOSIS — I6330 Cerebral infarction due to thrombosis of unspecified cerebral artery: Principal | ICD-10-CM | POA: Diagnosis present

## 2022-02-04 DIAGNOSIS — Z720 Tobacco use: Secondary | ICD-10-CM | POA: Diagnosis not present

## 2022-02-04 DIAGNOSIS — G4489 Other headache syndrome: Secondary | ICD-10-CM | POA: Diagnosis not present

## 2022-02-04 DIAGNOSIS — H5461 Unqualified visual loss, right eye, normal vision left eye: Secondary | ICD-10-CM | POA: Diagnosis not present

## 2022-02-04 DIAGNOSIS — Z7901 Long term (current) use of anticoagulants: Secondary | ICD-10-CM

## 2022-02-04 DIAGNOSIS — Z818 Family history of other mental and behavioral disorders: Secondary | ICD-10-CM | POA: Diagnosis not present

## 2022-02-04 DIAGNOSIS — Z79890 Hormone replacement therapy (postmenopausal): Secondary | ICD-10-CM

## 2022-02-04 DIAGNOSIS — Z90710 Acquired absence of both cervix and uterus: Secondary | ICD-10-CM

## 2022-02-04 DIAGNOSIS — R69 Illness, unspecified: Secondary | ICD-10-CM | POA: Diagnosis not present

## 2022-02-04 DIAGNOSIS — I2510 Atherosclerotic heart disease of native coronary artery without angina pectoris: Secondary | ICD-10-CM | POA: Diagnosis present

## 2022-02-04 DIAGNOSIS — E039 Hypothyroidism, unspecified: Secondary | ICD-10-CM | POA: Diagnosis not present

## 2022-02-04 DIAGNOSIS — E871 Hypo-osmolality and hyponatremia: Secondary | ICD-10-CM | POA: Diagnosis not present

## 2022-02-04 DIAGNOSIS — I350 Nonrheumatic aortic (valve) stenosis: Secondary | ICD-10-CM | POA: Diagnosis not present

## 2022-02-04 DIAGNOSIS — Z888 Allergy status to other drugs, medicaments and biological substances status: Secondary | ICD-10-CM

## 2022-02-04 DIAGNOSIS — Z952 Presence of prosthetic heart valve: Secondary | ICD-10-CM

## 2022-02-04 DIAGNOSIS — Z87891 Personal history of nicotine dependence: Secondary | ICD-10-CM

## 2022-02-04 DIAGNOSIS — E876 Hypokalemia: Secondary | ICD-10-CM | POA: Diagnosis present

## 2022-02-04 DIAGNOSIS — R7401 Elevation of levels of liver transaminase levels: Secondary | ICD-10-CM | POA: Diagnosis present

## 2022-02-04 DIAGNOSIS — R4182 Altered mental status, unspecified: Secondary | ICD-10-CM | POA: Diagnosis not present

## 2022-02-04 DIAGNOSIS — I6340 Cerebral infarction due to embolism of unspecified cerebral artery: Secondary | ICD-10-CM | POA: Diagnosis not present

## 2022-02-04 LAB — COMPREHENSIVE METABOLIC PANEL
ALT: 74 U/L — ABNORMAL HIGH (ref 0–44)
AST: 49 U/L — ABNORMAL HIGH (ref 15–41)
Albumin: 3.3 g/dL — ABNORMAL LOW (ref 3.5–5.0)
Alkaline Phosphatase: 145 U/L — ABNORMAL HIGH (ref 38–126)
Anion gap: 5 (ref 5–15)
BUN: 11 mg/dL (ref 8–23)
CO2: 31 mmol/L (ref 22–32)
Calcium: 8.5 mg/dL — ABNORMAL LOW (ref 8.9–10.3)
Chloride: 97 mmol/L — ABNORMAL LOW (ref 98–111)
Creatinine, Ser: 0.51 mg/dL (ref 0.44–1.00)
GFR, Estimated: >60 mL/min (ref >60)
Glucose, Bld: 109 mg/dL — ABNORMAL HIGH (ref 70–99)
Potassium: 3.3 mmol/L — ABNORMAL LOW (ref 3.5–5.1)
Sodium: 133 mmol/L — ABNORMAL LOW (ref 135–145)
Total Bilirubin: 1.1 mg/dL (ref 0.3–1.2)
Total Protein: 7 g/dL (ref 6.5–8.1)

## 2022-02-04 LAB — CBC
HCT: 39.5 % (ref 36.0–46.0)
Hemoglobin: 13.5 g/dL (ref 12.0–15.0)
MCH: 33 pg (ref 26.0–34.0)
MCHC: 34.2 g/dL (ref 30.0–36.0)
MCV: 96.6 fL (ref 80.0–100.0)
Platelets: 152 10*3/uL (ref 150–400)
RBC: 4.09 MIL/uL (ref 3.87–5.11)
RDW: 13.2 % (ref 11.5–15.5)
WBC: 11.6 10*3/uL — ABNORMAL HIGH (ref 4.0–10.5)
nRBC: 0 % (ref 0.0–0.2)

## 2022-02-04 LAB — CBG MONITORING, ED: Glucose-Capillary: 119 mg/dL — ABNORMAL HIGH (ref 70–99)

## 2022-02-04 LAB — PROTIME-INR
INR: 1.1 (ref 0.8–1.2)
Prothrombin Time: 13.9 seconds (ref 11.4–15.2)

## 2022-02-04 IMAGING — CT CT ANGIO HEAD-NECK (W OR W/O PERF)
2 of 12 series · 6 of 33 positions shown · IV contrast (Omnipaque or Isovue)
Comparison: None Available.

CLINICAL DATA: Neuro deficit, acute, stroke suspected

EXAM:
CT ANGIOGRAPHY HEAD AND NECK
TECHNIQUE: Multidetector CT imaging of the head and neck was performed using
the standard protocol during bolus administration of intravenous
contrast. Multiplanar CT image reconstructions and MIPs were
obtained to evaluate the vascular anatomy. Carotid stenosis
measurements (when applicable) are obtained utilizing NASCET
criteria, using the distal internal carotid diameter as the
denominator.

[Series 9: cta head & neck · axial · 0.40mm/px · z∈[+1623,+1828]mm · 4 of 700 slices shown]
[im 140/700  soft-tissue]
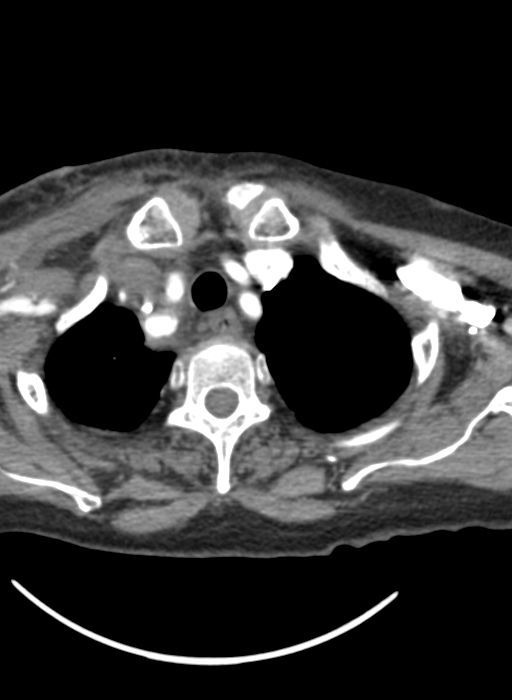
[im 280/700  bone]
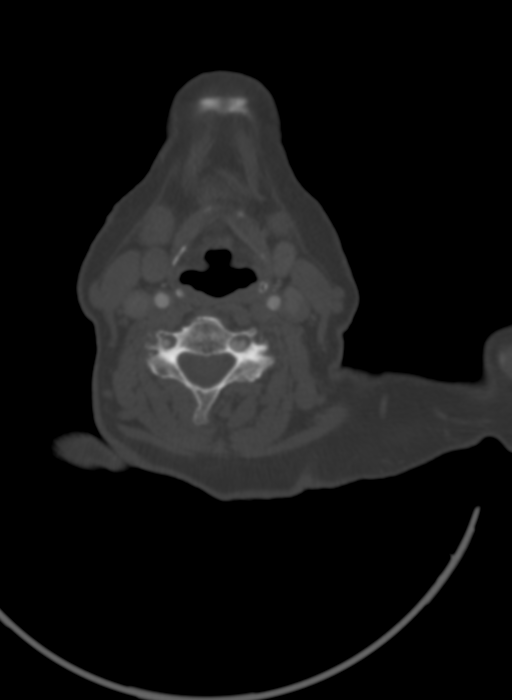
[im 420/700  soft-tissue]
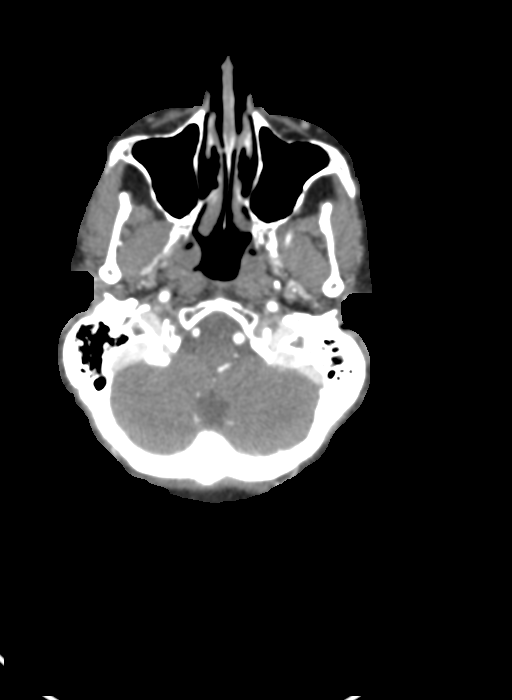
[im 560/700  bone]
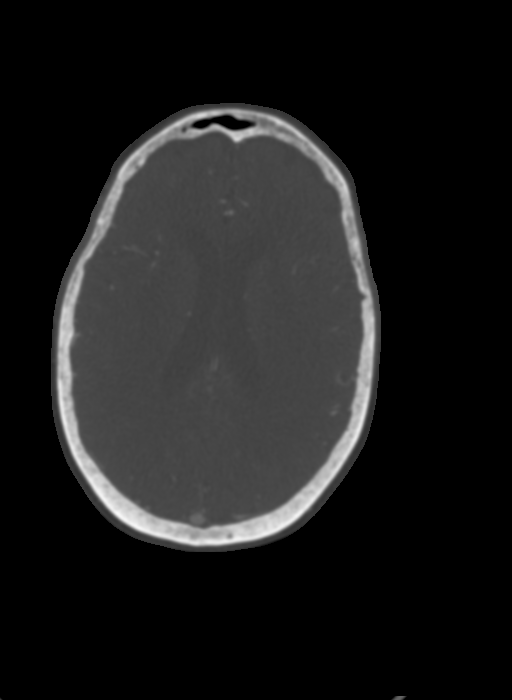

[Series 10: ax thins · axial · 0.40mm/px · z∈[+1669,+1782]mm · 2 of 350 slices shown]
[im 117/350  soft-tissue]
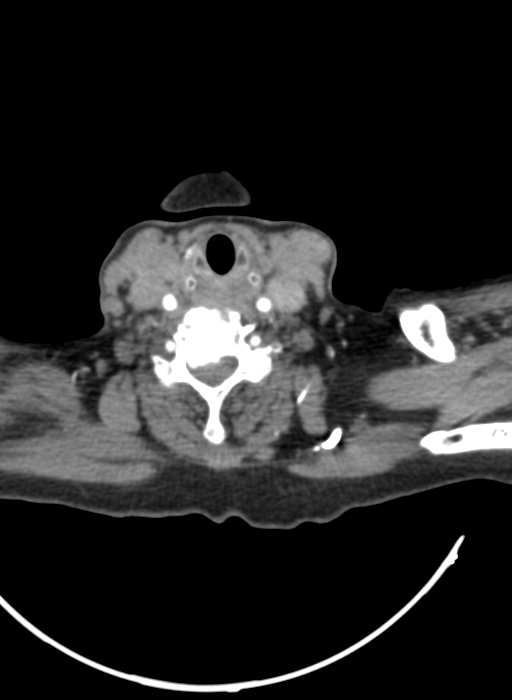
[im 233/350  soft-tissue]
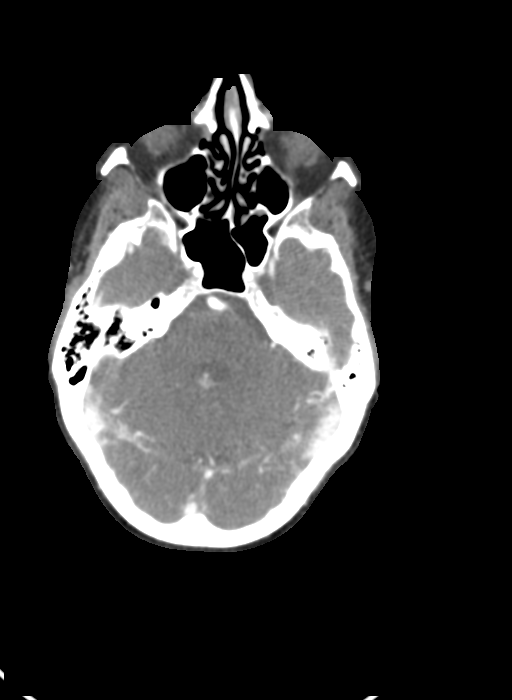

[6 of 33 positions shown; findings below may reference images not displayed]

RADIATION DOSE REDUCTION: This exam was performed according to the
departmental dose-optimization program which includes automated
exposure control, adjustment of the mA and/or kV according to
patient size and/or use of iterative reconstruction technique.

CONTRAST:  100mL OMNIPAQUE IOHEXOL 350 MG/ML SOLN
FINDINGS: CT HEAD FINDINGS

Brain: No evidence of acute large vascular territory infarction,
hemorrhage, hydrocephalus, extra-axial collection or mass
lesion/mass effect. Patchy white matter hypodensities, nonspecific
but compatible with chronic microvascular ischemic disease.

Vascular: Detailed below.

Skull: No acute fracture.

Sinuses: Visualized sinuses are clear.

Orbits: No acute finding.

Review of the MIP images confirms the above findings

CTA NECK FINDINGS

Aortic arch: Aortic atherosclerosis. Great vessel origins are patent
with mild-to-moderate atherosclerotic narrowing. Apparent linear
filling defect within the brachiocephalic artery is favored
artifactual given artifact this region.

Right carotid system: Atherosclerosis at the carotid bifurcation
without greater than 50% stenosis.

Left carotid system: Atherosclerosis involving the common carotid
artery and at the carotid bifurcation without greater than 50%
stenosis.

Vertebral arteries: Moderate to severe left vertebral artery origin
stenosis. Otherwise, vertebral arteries are patent without
significant stenosis.

Skeleton: Severe multilevel degenerative disc disease. Multilevel
facet uncovertebral hypertrophy with varying degrees of neural
foraminal stenosis.

Other neck: No acute findings.

Upper chest: Emphysema.

Review of the MIP images confirms the above findings

CTA HEAD FINDINGS

Anterior circulation: Bilateral intracranial ICAs, MCAs, and ACAs
are patent without proximal hemodynamically significant stenosis

Posterior circulation: Bilateral intradural vertebral arteries,
basilar artery and bilateral posterior cerebral arteries are patent
without proximal hemodynamically significant stenosis. No aneurysm
identified.

Venous sinuses: As permitted by contrast timing, patent.

Review of the MIP images confirms the above findings
IMPRESSION: 1. No emergent large vessel occlusion.
2. Moderate to severe left vertebral artery origin stenosis.
3. Bilateral carotid bifurcation atherosclerosis without greater
than 50% stenosis.
4. Aortic Atherosclerosis ([7X]-[7X]) and Emphysema ([7X]-[7X]).

## 2022-02-04 IMAGING — MR MR HEAD W/O CM
11 of 12 series · 40 of 48 positions shown · non-contrast
Comparison: None Available.

CLINICAL DATA: Neuro deficit, acute, stroke suspected; VISION
DEFICIT

EXAM:
MRI HEAD WITHOUT CONTRAST
TECHNIQUE: Multiplanar, multiecho pulse sequences of the brain and surrounding
structures were obtained without intravenous contrast.

[Series 5: DWI · axial · 4.0mm · 0.88mm/px · z∈[-107,+32]mm · 4 of 36 slices shown (1 of 6)]
[im 1/36]
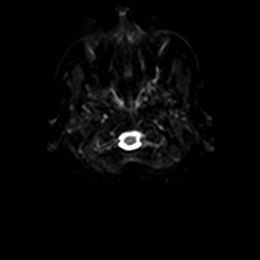
[im 12/36]
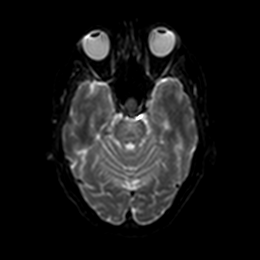
[im 24/36]
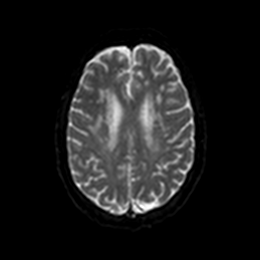
[im 36/36]
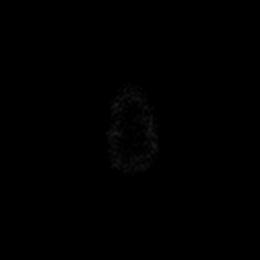

[Series 5: DWI · axial · 4.0mm · 0.88mm/px · z∈[-107,+32]mm · 4 of 36 slices shown (2 of 6)]
[im 1/36]
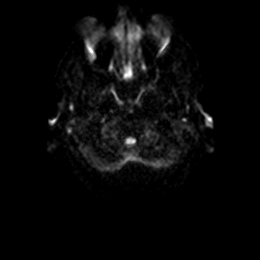
[im 12/36]
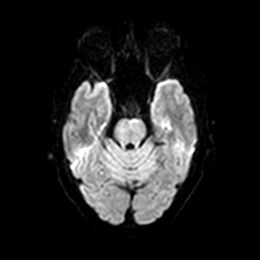
[im 24/36]
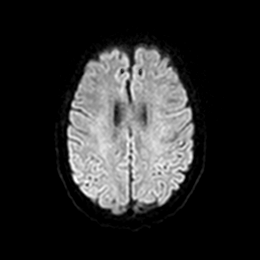
[im 36/36]
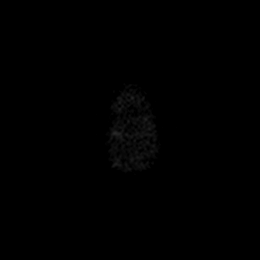

[Series 6: DWI · axial · 4.0mm · 0.88mm/px · z∈[-107,+32]mm · 4 of 36 slices shown (3 of 6)]
[im 1/36]
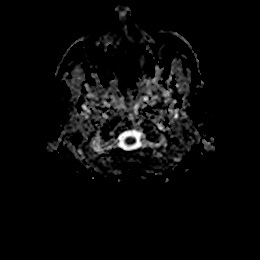
[im 12/36]
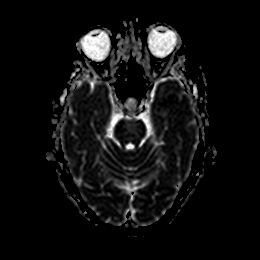
[im 24/36]
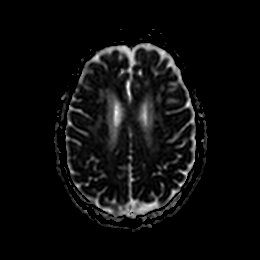
[im 36/36]
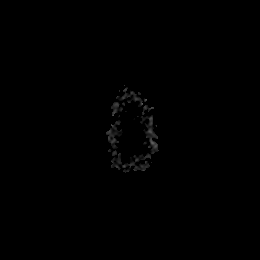

[Series 7: DWI · coronal · 5.0mm · 0.88mm/px · 3 of 28 slices shown (4 of 6)]
[im 1/28]
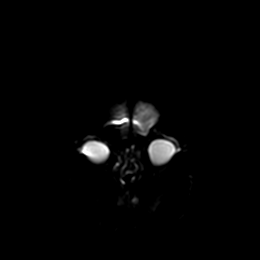
[im 14/28]
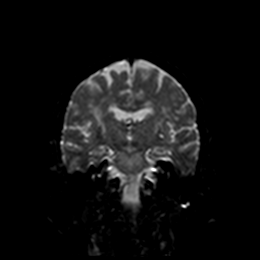
[im 28/28]
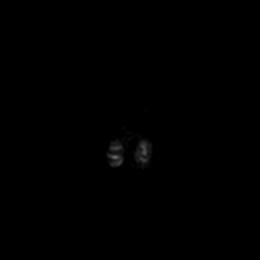

[Series 7: DWI · coronal · 5.0mm · 0.88mm/px · 4 of 28 slices shown (5 of 6)]
[im 1/28]
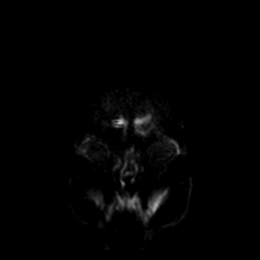
[im 10/28]
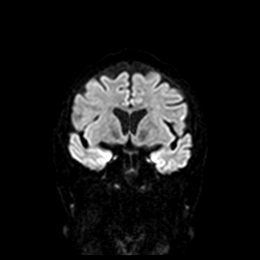
[im 19/28]
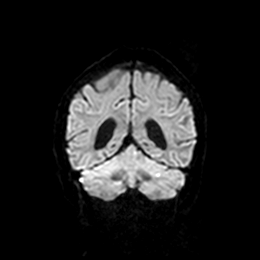
[im 28/28]
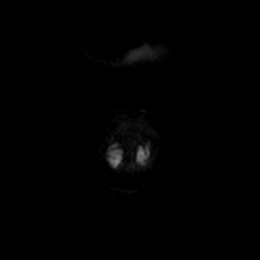

[Series 8: DWI · coronal · 5.0mm · 0.88mm/px · 4 of 28 slices shown (6 of 6)]
[im 1/28]
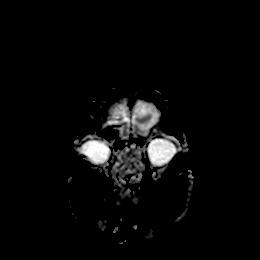
[im 10/28]
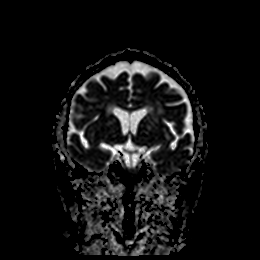
[im 19/28]
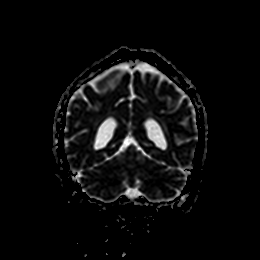
[im 28/28]
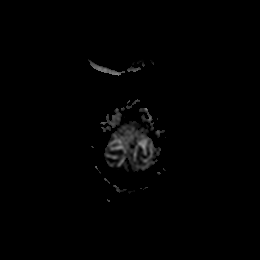

[Series 9: T1 · sagittal · 5.0mm · 0.94mm/px · 3 of 21 slices shown]
[im 1/21]
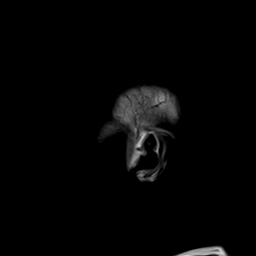
[im 11/21]
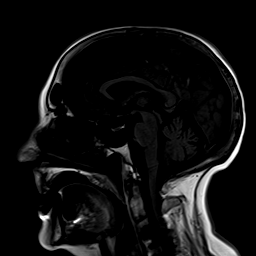
[im 21/21]
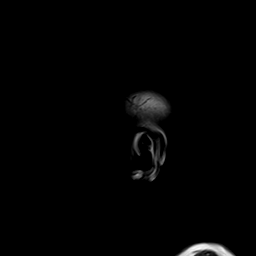

[Series 10: T2 · axial · 5.0mm · 0.72mm/px · z∈[-104,+28]mm · 3 of 20 slices shown (1 of 2)]
[im 1/20]
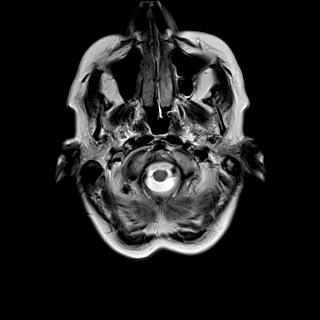
[im 10/20]
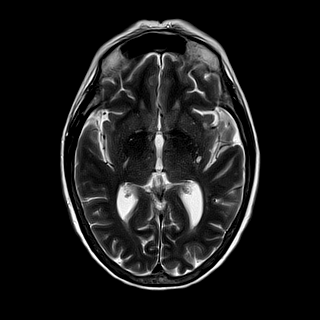
[im 20/20]
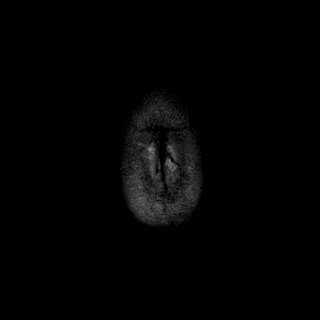

[Series 11: ax hemo · axial · 5.0mm · 0.86mm/px · z∈[-108,+35]mm · 3 of 25 slices shown]
[im 1/25]
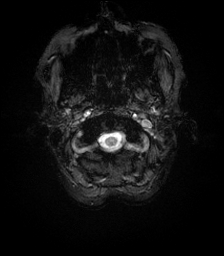
[im 13/25]
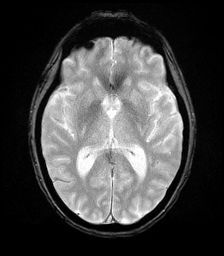
[im 25/25]
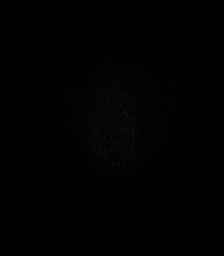

[Series 12: FLAIR · axial · 4.0mm · 0.43mm/px · z∈[-98,+25]mm · 4 of 32 slices shown]
[im 1/32]
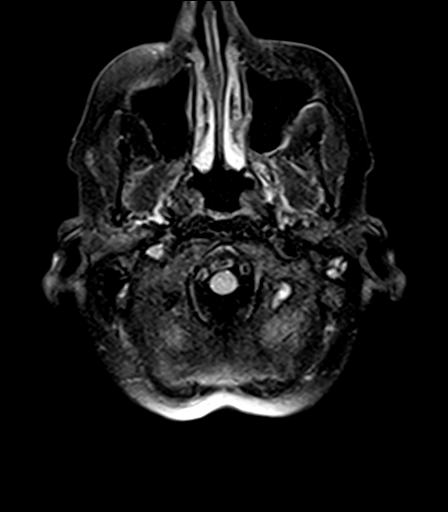
[im 11/32]
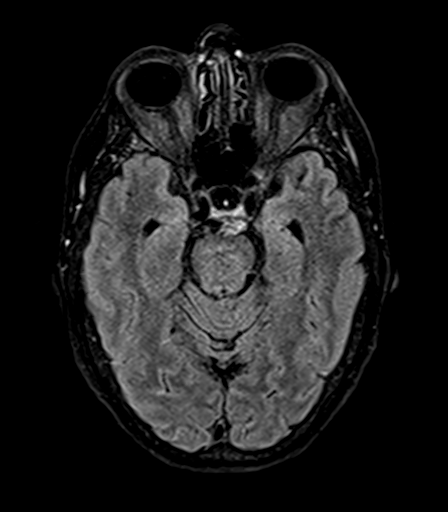
[im 21/32]
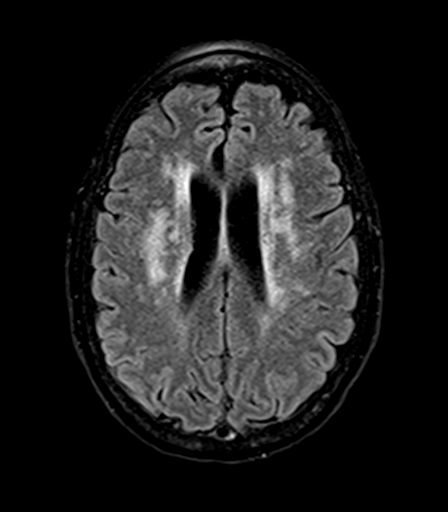
[im 32/32]
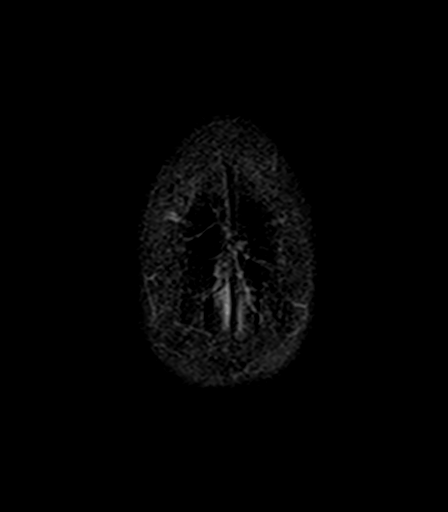

[Series 14: T2 · coronal · 5.0mm · 0.72mm/px · 4 of 28 slices shown (2 of 2)]
[im 1/28]
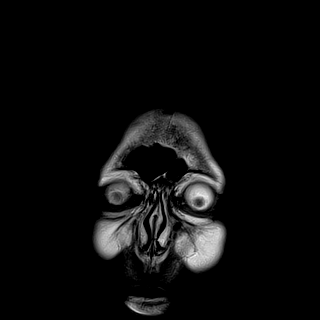
[im 10/28]
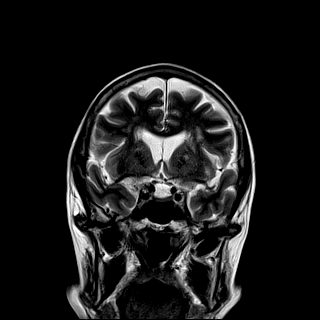
[im 19/28]
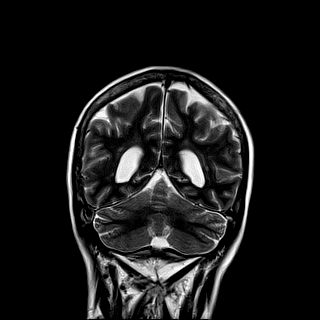
[im 28/28]
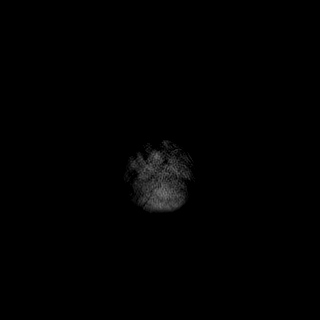

[40 of 48 positions shown; findings below may reference images not displayed]

FINDINGS: Brain: Few small scattered foci of diffusion hyperintensity
including left frontal cortex, left posterior temporal cortex, and
bilateral cerebellum. No evidence of intracranial hemorrhage.

Ventricles and sulci are within normal limits in size and
configuration. Patchy and confluent areas of T2 hyperintensity in
the supratentorial and pontine white matter are nonspecific but may
reflect mild to moderate chronic microvascular ischemic changes.

Vascular: Major vessel flow voids at the skull base are preserved.

Skull and upper cervical spine: Normal marrow signal is preserved.

Sinuses/Orbits: Paranasal sinuses are aerated. Orbits are
unremarkable.

Other: Sella is unremarkable.  Mastoid air cells are clear.
IMPRESSION: Few punctate acute to subacute infarcts involving different vascular
territories. No hemorrhage.

Chronic microvascular ischemic changes.

## 2022-02-04 MED ORDER — POTASSIUM CHLORIDE CRYS ER 20 MEQ PO TBCR
40.0000 meq | EXTENDED_RELEASE_TABLET | Freq: Four times a day (QID) | ORAL | Status: AC
  Administered 2022-02-04 – 2022-02-05 (×2): 40 meq via ORAL
  Filled 2022-02-04 (×2): qty 2

## 2022-02-04 MED ORDER — APIXABAN 5 MG PO TABS
5.0000 mg | ORAL_TABLET | Freq: Two times a day (BID) | ORAL | Status: DC
  Administered 2022-02-05 – 2022-02-06 (×4): 5 mg via ORAL
  Filled 2022-02-04 (×4): qty 1

## 2022-02-04 MED ORDER — IOHEXOL 350 MG/ML SOLN
100.0000 mL | Freq: Once | INTRAVENOUS | Status: AC | PRN
  Administered 2022-02-04: 100 mL via INTRAVENOUS

## 2022-02-04 MED ORDER — STROKE: EARLY STAGES OF RECOVERY BOOK
Freq: Once | Status: DC
  Filled 2022-02-04: qty 1

## 2022-02-04 MED ORDER — ACETAMINOPHEN 650 MG RE SUPP: 650.0000 mg | RECTAL | Status: DC | PRN

## 2022-02-04 MED ORDER — LEVOTHYROXINE SODIUM 88 MCG PO TABS
88.0000 ug | ORAL_TABLET | Freq: Every day | ORAL | Status: DC
  Administered 2022-02-05 – 2022-02-06 (×2): 88 ug via ORAL
  Filled 2022-02-04 (×2): qty 1

## 2022-02-04 MED ORDER — ACETAMINOPHEN 160 MG/5ML PO SOLN: 650.0000 mg | ORAL | Status: DC | PRN

## 2022-02-04 MED ORDER — SODIUM CHLORIDE 0.9 % IV SOLN: INTRAVENOUS | Status: DC

## 2022-02-04 MED ORDER — ACETAMINOPHEN 325 MG PO TABS
650.0000 mg | ORAL_TABLET | ORAL | Status: DC | PRN
  Administered 2022-02-05 – 2022-02-06 (×3): 650 mg via ORAL
  Filled 2022-02-04 (×3): qty 2

## 2022-02-04 MED ORDER — ATORVASTATIN CALCIUM 10 MG PO TABS
20.0000 mg | ORAL_TABLET | Freq: Every day | ORAL | Status: DC
  Administered 2022-02-04 – 2022-02-05 (×2): 20 mg via ORAL
  Filled 2022-02-04 (×2): qty 2

## 2022-02-04 MED ORDER — OMEGA-3-6-9 PO CAPS
1.0000 | ORAL_CAPSULE | Freq: Two times a day (BID) | ORAL | Status: DC
Start: 2022-02-04 — End: 2022-02-04

## 2022-02-04 MED ORDER — FENTANYL CITRATE PF 50 MCG/ML IJ SOSY
50.0000 ug | PREFILLED_SYRINGE | Freq: Once | INTRAMUSCULAR | Status: AC
  Administered 2022-02-04: 50 ug via INTRAVENOUS
  Filled 2022-02-04: qty 1

## 2022-02-04 MED ORDER — VITAMIN D 25 MCG (1000 UNIT) PO TABS
2000.0000 [IU] | ORAL_TABLET | Freq: Every day | ORAL | Status: DC
  Administered 2022-02-04 – 2022-02-06 (×3): 2000 [IU] via ORAL
  Filled 2022-02-04 (×7): qty 2

## 2022-02-04 NOTE — ED Notes (Addendum)
Pt vomited small amount of mucous -pt says she feels better afterwards

## 2022-02-04 NOTE — ED Provider Notes (Signed)
[facility] EMERGENCY DEPARTMENT Provider Note   CSN: [id number] Arrival date & time: 02/04/22  1234  An emergency department physician performed an initial assessment on this suspected stroke patient at 1300.  History  Chief Complaint  Patient presents with   Altered Mental Status   Headache    Tammie Martin is a 71 y.o. female.   Altered Mental Status Presenting symptoms: confusion   Associated symptoms: headaches   Headache Patient presents with headache.Confusion.  Headache.  Reportedly woke up confused.  Reportedly had vision change earlier today.  States she could not see.  Bad headache.  Somewhat agitated.  Had TAVR and was discharged from the hospital 2 days ago.  She is back on her anticoagulation.  Does tend to get headaches and neck pain but states this 1 did not go away.    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 CBurt Knackand Dr. BCyndia Bent  Severe aortic stenosis    Thyroid disease    Tobacco abuse     Home Medications Prior to Admission medications   Medication Sig Start Date End Date Taking? Authorizing Provider  atorvastatin (LIPITOR) 20 MG tablet Take 1 tablet (20 mg total) by mouth daily. 12/06/21  Yes Gottschalk, ALeatrice JewelsM, DO  bisoprolol (ZEBETA) 10 MG tablet Take 1.5 tablets (15 mg total) by mouth daily. 11/11/21  Yes Branch, JAlphonse Guild MD  ELIQUIS 5 MG TABS tablet TAKE ONE TABLET BY MOUTH TWICE DAILY 02/03/22  Yes Gottschalk, Ashly M, DO  furosemide (LASIX) 20 MG tablet TAKE ONE TABLET BY MOUTH DAILY Patient taking differently: Take 20 mg by mouth daily.  02/03/22  Yes GRonnie DossM, DO  acetaminophen (TYLENOL) 500 MG tablet Take 1,000 mg by mouth every 6 (six) hours as needed for moderate pain.    [provider]  albuterol (VENTOLIN HFA) 108 (90 Base) MCG/ACT inhaler Inhale 2 puffs into the lungs every 6 (six) hours as needed for wheezing or shortness of breath. 11/01/21   GJanora Norlander DO  CALCIUM-MAGNESIUM PO Take 1 tablet by mouth daily.    [provider]  cetirizine (ZYRTEC) 10 MG tablet Take 10 mg by mouth daily.    [provider]  Cholecalciferol (VITAMIN D) 50 MCG (2000 UT) tablet Take 2,000 Units by mouth daily.    [provider]  desvenlafaxine (PRISTIQ) 50 MG 24 hr tablet Take 1 tablet (50 mg total) by mouth daily. 09/08/21   GJanora Norlander DO  fluticasone (FLONASE) 50 MCG/ACT nasal spray Place 2 sprays into both nostrils daily as needed for allergies or rhinitis. 10/28/20   GRonnie DossM, DO  hydrocortisone cream 1 % Apply 1 application. topically 2 (two) times daily as needed (rash).    [provider]  levothyroxine (SYNTHROID) 88 MCG tablet Take 1 tablet by mouth daily. Patient taking differently: Take 88 mcg by mouth daily before breakfast. 11/29/21   GJanora Norlander DO  Multiple Vitamins-Minerals (ALIVE WOMENS 50+ PO) Take 1 tablet by mouth daily.  [provider]  Multiple Vitamins-Minerals (PRESERVISION AREDS 2 PO) Take 1 capsule by mouth in the morning and at bedtime.    [provider]  Omega-3-6-9 CAPS Take 1 capsule by mouth 2 (two) times daily.    [provider]  Polyethyl Glycol-Propyl Glycol (SYSTANE OP) Place 1 drop into both eyes daily as needed (dry eyes).    [provider]  potassium chloride SA (KLOR-CON M) 10 MEQ tablet Take 1 tablet (10 mEq total) by mouth daily. 01/28/22   Eileen Stanford, PA-C  Probiotic Product (PROBIOTIC ADVANCED PO) Take 1 capsule by mouth daily.    [provider]  tobramycin  (TOBREX) 0.3 % ophthalmic solution Place 1 drop into the right eye See admin instructions. Instill 1 drop into the right eye three times daily, the day before, day of and day after eye injections 04/15/20   [provider]  traZODone (DESYREL) 50 MG tablet Take 0.5-1 tablets (25-50 mg total) by mouth at bedtime as needed for sleep. Patient taking differently: Take 50 mg by mouth at bedtime as needed for sleep. 04/15/21   Janora Norlander, DO  TURMERIC PO Take 1 capsule by mouth 2 (two) times daily.    [provider]  umeclidinium-vilanterol (ANORO ELLIPTA) 62.5-25 MCG/ACT AEPB Inhale 1 puff into the lungs daily at 6 (six) AM. 11/01/21   Janora Norlander, DO      Allergies    Penicillins, Singulair [montelukast sodium], Codeine, Nicoderm [nicotine], and Other    Review of Systems   Review of Systems  Neurological:  Positive for headaches.  Psychiatric/Behavioral:  Positive for confusion.     Physical Exam Updated Vital Signs BP (!) 147/99   Pulse 67   Temp 97.6 F (36.4 C) (Oral)   Resp 19   SpO2 92%  Physical Exam Vitals and nursing note reviewed.  HENT:     Head: Normocephalic.  Cardiovascular:     Rate and Rhythm: Regular rhythm.  Pulmonary:     Comments: Healing wound right subclavian area. Skin:    General: Skin is warm.  Neurological:     Mental Status: She is alert.     Comments: May have some mild confusion.  Somewhat agitated.  Eye movements intact.  Says she feels unsteady but finger-nose is intact.     ED Results / Procedures / Treatments   Labs (all labs ordered are listed, but only abnormal results are displayed) Labs Reviewed  COMPREHENSIVE METABOLIC PANEL - Abnormal; Notable for the following components:      Result Value   Sodium 133 (*)    Potassium 3.3 (*)    Chloride 97 (*)    Glucose, Bld 109 (*)    Calcium 8.5 (*)    Albumin 3.3 (*)    AST 49 (*)    ALT 74 (*)    Alkaline Phosphatase 145 (*)    All other components within  normal limits  CBC - Abnormal; Notable for the following components:   WBC 11.6 (*)    All other components within normal limits  CBG MONITORING, ED - Abnormal; Notable for the following components:   Glucose-Capillary 119 (*)    All other components within normal limits  PROTIME-INR  URINALYSIS, ROUTINE W REFLEX MICROSCOPIC    EKG None  Radiology CT ANGIO HEAD NECK W WO CM  Result Date: 02/04/2022 CLINICAL DATA:  Neuro deficit, acute, stroke suspected EXAM: CT ANGIOGRAPHY HEAD AND NECK TECHNIQUE: Multidetector CT imaging of the head  and neck was performed using the standard protocol during bolus administration of intravenous contrast. Multiplanar CT image reconstructions and MIPs were obtained to evaluate the vascular anatomy. Carotid stenosis measurements (when applicable) are obtained utilizing NASCET criteria, using the distal internal carotid diameter as the denominator. RADIATION DOSE REDUCTION: This exam was performed according to the departmental dose-optimization program which includes automated exposure control, adjustment of the mA and/or kV according to patient size and/or use of iterative reconstruction technique. CONTRAST:  134m OMNIPAQUE IOHEXOL 350 MG/ML SOLN COMPARISON:  None Available. FINDINGS: CT HEAD FINDINGS Brain: No evidence of acute large vascular territory infarction, hemorrhage, hydrocephalus, extra-axial collection or mass lesion/mass effect. Patchy white matter hypodensities, nonspecific but compatible with chronic microvascular ischemic disease. Vascular: Detailed below. Skull: No acute fracture. Sinuses: Visualized sinuses are clear. Orbits: No acute finding. Review of the MIP images confirms the above findings CTA NECK FINDINGS Aortic arch: Aortic atherosclerosis. Great vessel origins are patent with mild-to-moderate atherosclerotic narrowing. Apparent linear filling defect within the brachiocephalic artery is favored artifactual given artifact this region. Right  carotid system: Atherosclerosis at the carotid bifurcation without greater than 50% stenosis. Left carotid system: Atherosclerosis involving the common carotid artery and at the carotid bifurcation without greater than 50% stenosis. Vertebral arteries: Moderate to severe left vertebral artery origin stenosis. Otherwise, vertebral arteries are patent without significant stenosis. Skeleton: Severe multilevel degenerative disc disease. Multilevel facet uncovertebral hypertrophy with varying degrees of neural foraminal stenosis. Other neck: No acute findings. Upper chest: Emphysema. Review of the MIP images confirms the above findings CTA HEAD FINDINGS Anterior circulation: Bilateral intracranial ICAs, MCAs, and ACAs are patent without proximal hemodynamically significant stenosis Posterior circulation: Bilateral intradural vertebral arteries, basilar artery and bilateral posterior cerebral arteries are patent without proximal hemodynamically significant stenosis. No aneurysm identified. Venous sinuses: As permitted by contrast timing, patent. Review of the MIP images confirms the above findings IMPRESSION: 1. No emergent large vessel occlusion. 2. 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 3. Bilateral carotid bifurcation atherosclerosis without greater than 50% stenosis. 4. Aortic Atherosclerosis (ICD10-I70.0) and Emphysema (ICD10-J43.9). Electronically Signed   By: FMargaretha SheffieldM.D.   On: 02/04/2022 14:31    Procedures Procedures    Medications Ordered in ED Medications  iohexol (OMNIPAQUE) 350 MG/ML injection 100 mL (100 mLs Intravenous Contrast Given 02/04/22 1306)  fentaNYL (SUBLIMAZE) injection 50 mcg (50 mcg Intravenous Given 02/04/22 1452)    ED Course/ Medical Decision Making/ A&P                           Medical Decision Making Amount and/or Complexity of Data Reviewed Labs: ordered. Radiology: ordered.  Risk Prescription drug management.   Patient brought in with  mental status change.  Confusion and some agitation.  Woke up this morning with it.  Recent TAVR.  Had some vision changes.  Also had neck pain and headache.  CT head and neck done and overall reassuring although has some likely chronic vertebral stenosis.  Feeling somewhat better after some fentanyl.  Urinalysis still pending.  Lab work shows mild electrolyte abnormalities and AST ALT slightly above normal.  However with the confusion somewhat acute onset I feel that she would benefit from admission to the hospital.  Reportedly had not been on pain medicines at home.  Will discuss with hospitalist for admission.        Final Clinical Impression(s) / ED Diagnoses Final diagnoses:  Encephalopathy  History of transcatheter aortic valve  replacement (TAVR)    Rx / DC Orders ED Discharge Orders     None         Davonna Belling, MD 02/04/22 [phone]

## 2022-02-04 NOTE — Progress Notes (Signed)
Order will release at 0800 02/05/22. Per Dr. Erlinda Hong, patient wanted to try and sleep a little.

## 2022-02-04 NOTE — ED Notes (Signed)
Edp in triage with pt

## 2022-02-04 NOTE — ED Notes (Signed)
Pt taken to ct 

## 2022-02-04 NOTE — ED Notes (Signed)
Pt teeth and glasses are in pt belonging bag with pt label.

## 2022-02-04 NOTE — ED Triage Notes (Addendum)
Pt with sister. Pt arrived alert but confused. Had aortic valve replacement Tuesday. Daughter stated pt woke up with confusion and was complaining she couldn't see today at 1000 daughter stated confusion was intermittent yesterday and constant today. Unsteady gait. Pt c/o "bad headache in my neck". Mild agitation noted.

## 2022-02-04 NOTE — Telephone Encounter (Signed)
Pt's daughter is calling back with questions in regards to her mothers medication and the check marks that are beside the medications. She wants to know if the check marks mean the pt shouldn't take these medications.   Transferred to RN

## 2022-02-04 NOTE — Progress Notes (Signed)
PHARMACIST - PHYSICIAN ORDER COMMUNICATION  CONCERNING: P&T Medication Policy on Herbal Medications  DESCRIPTION:  This patient's order for:  Omega 3-6-9   has been noted.  This product(s) is classified as an "herbal" or natural product. Due to a lack of definitive safety studies or FDA approval, nonstandard manufacturing practices, plus the potential risk of unknown drug-drug interactions while on inpatient medications, the Pharmacy and Therapeutics Committee does not permit the use of "herbal" or natural products of this type within [facility].   ACTION TAKEN: The pharmacy department is unable to verify this order at this time and your patient has been informed of this safety policy. Please reevaluate patient's clinical condition at discharge and address if the herbal or natural product(s) should be resumed at that time.

## 2022-02-04 NOTE — Telephone Encounter (Signed)
I called back and spoke with the pts daughter, Tammie Martin, she says the pt has been c/o a severe headache and neck pain that has started this morning... she gave her an ice pack and the pt was having vision problems not "normal' for her.. and could not see that she was handing her an ice pack that was in front of her.. she has not been slurring her words and has not noticed any weakness on one side... I have urged her to call EMS to let them come and assess her especially since she just had a procedure this week... she agrees and will call asap.. I have advised Bonney Leitz PA.

## 2022-02-04 NOTE — Telephone Encounter (Signed)
Tammie Martin patient's daughter called stating her mother had surgery on Tuesday. Her mother has a really bad headache and her neck hurt,  and can't see thing right in front of her.  Her daughter can be reached [phone] or [phone].

## 2022-02-04 NOTE — Telephone Encounter (Signed)
Pts daughter called to advise that EMS had urged her to go to to the ED so they took her to [facility]... her daughter called to go over her meds with me and to be sure the pt had been taking the right meds....she will let me know if she has any further questions or assistance needed.

## 2022-02-04 NOTE — H&P (Signed)
[facility] H&P   Patient Demographics:    Tammie Martin, is a 71 y.o. female  MRN: [id number]   DOB - [birth year]  Admit Date - 02/04/2022  Outpatient Primary MD for the patient is Janora Norlander, DO  Referring MD/NP/PA: Dr Alvino Chapel  Outpatient Specialists: Patient TAVR by Dr. Caffie Pinto and Dr. Burt Knack  Patient coming from: Home  Chief Complaint  Patient presents with   Altered Mental Status   Headache      HPI:    Tammie Martin  is a 71 y.o. female, with past medical history of anxiety, atrial fibrillation on Eliquis, COPD, depression, hyperlipidemia, hypertension, hypothyroidism, severe aortic stenosis status post TAVR 02/01/2022 by Dr. Burt Knack and Dr Cyndia Bent , just discharged from [facility] 02/02/2022, tobacco abuse. -Patient presents to ED secondary to complaints of headache, confusion, reports she woke up today with the symptoms, as well reports worsening vision as well, but patient reports she is with retinal detachment of the right eye, with total vision loss there, but reports she is with some residual vision in the left eye where she was still able to drive infrequently, she report worsening vision, headache, as well sister at bedside reports some confusion noted as well, prompted family to bring her to ED, her Eliquis was held during procedure but she was resumed on it upon discharge, and family report patient was taken at last 2 days since discharge, but she did not take it today. -In ED her CT head with no acute finding, CTA head and neck significant for  no emergent LVO, but significant for moderate to severe left vertebral artery origin stenosis, and bilateral carotid bifurcation atherosclerosis.  Her MRI brain was significant for embolic CVA pattern, as well labs significant for sodium 133, potassium 3.3, [facility] hospitalist consulted to admit.    Review of systems:      A full 10 point Review of Systems was done, except as stated above, all other Review of Systems were negative.   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 CBurt Knackand Dr. BCyndia Bent  Severe aortic stenosis    Thyroid disease    Tobacco abuse       Past Surgical History:  Procedure Laterality Date   AORTIC VALVE REPLACEMENT     APPENDECTOMY     pt recently had CT that said Appendix was there  so she is unsure if this was actually removed during hysterectomy as she was previously told   BREAST ENHANCEMENT SURGERY     CARDIOVERSION N/A 10/27/2021   Procedure: CARDIOVERSION;  Surgeon: Arnoldo Lenis, MD;  Location: [facility];  Service: Endoscopy;  Laterality: N/A;   INTRAOPERATIVE TRANSESOPHAGEAL ECHOCARDIOGRAM N/A 02/01/2022   Procedure: INTRAOPERATIVE TRANSESOPHAGEAL ECHOCARDIOGRAM;  Surgeon: Sherren Mocha, MD;  Location: [place];  Service: Open Heart Surgery;  Laterality: N/A;   RIGHT/LEFT HEART CATH AND CORONARY ANGIOGRAPHY N/A 11/22/2021   Procedure: RIGHT/LEFT HEART CATH AND CORONARY ANGIOGRAPHY;  Surgeon: Sherren Mocha, MD;  Location: [place] [facility];  Service: Cardiovascular;  Laterality: N/A;   TONSILLECTOMY     VAGINAL HYSTERECTOMY        Social History:     Social History   Tobacco Use   Smoking status: Former    Packs/day: 1.00    Years: 40.00    Total pack years: 40.00    Types: Cigarettes   Smokeless tobacco: Never  Substance Use Topics   Alcohol use: Not Currently       Family History :     Family History   Problem Relation Age of Onset   Cancer Mother        breast cancer at 107    Kidney disease Mother    Cancer Maternal Aunt        colon cancer   Heart attack Father    Diabetes Sister    Obesity Sister    Depression Sister    Alcohol abuse Brother    Cirrhosis Brother    Depression Brother    Liver disease Brother    Diabetes Daughter    Fibromyalgia Daughter    Depression Daughter    Anxiety disorder Daughter    Arthritis Daughter    Depression Sister    Obesity Sister       Home Medications:   Prior to Admission medications   Medication Sig Start Date End Date Taking? Authorizing Provider  albuterol (VENTOLIN HFA) 108 (90 Base) MCG/ACT inhaler Inhale 2 puffs into the lungs every 6 (six) hours as needed for wheezing or shortness of breath. 11/01/21  Yes Gottschalk, Leatrice Jewels M, DO  atorvastatin (LIPITOR) 20 MG tablet Take 1 tablet (20 mg total) by mouth daily. 12/06/21  Yes Gottschalk, Leatrice Jewels M, DO  bisoprolol (ZEBETA) 10 MG tablet Take 1.5 tablets (15 mg total) by mouth daily. 11/11/21  Yes BranchAlphonse Guild, MD  cetirizine (ZYRTEC) 10 MG tablet Take 10 mg by mouth daily.   Yes [provider]  ELIQUIS 5 MG TABS tablet TAKE ONE TABLET BY MOUTH TWICE DAILY 02/03/22  Yes Gottschalk, Ashly M, DO  furosemide (LASIX) 20 MG tablet TAKE ONE TABLET BY MOUTH DAILY Patient taking differently: Take 20 mg by mouth daily. 02/03/22  Yes Ronnie Doss M, DO  acetaminophen (TYLENOL) 500 MG tablet Take 1,000 mg by mouth every 6 (six) hours as needed for moderate pain.    [provider]  CALCIUM-MAGNESIUM PO Take 1 tablet by mouth daily.    [provider]  Cholecalciferol (VITAMIN D) 50 MCG (2000 UT) tablet Take 2,000 Units by mouth daily.    [provider]  desvenlafaxine (PRISTIQ) 50 MG 24 hr tablet Take 1 tablet (50 mg total) by mouth daily. 09/08/21   Janora Norlander, DO  fluticasone (FLONASE) 50 MCG/ACT nasal spray Place 2 sprays into both nostrils daily  as needed for allergies or rhinitis. 10/28/20   Lajuana Ripple,  Ashly M, DO  hydrocortisone cream 1 % Apply 1 application. topically 2 (two) times daily as needed (rash).    [provider]  levothyroxine (SYNTHROID) 88 MCG tablet Take 1 tablet by mouth daily. Patient taking differently: Take 88 mcg by mouth daily before breakfast. 11/29/21   Janora Norlander, DO  Multiple Vitamins-Minerals (ALIVE WOMENS 50+ PO) Take 1 tablet by mouth daily.    [provider]  Multiple Vitamins-Minerals (PRESERVISION AREDS 2 PO) Take 1 capsule by mouth in the morning and at bedtime.    [provider]  Omega-3-6-9 CAPS Take 1 capsule by mouth 2 (two) times daily.    [provider]  Polyethyl Glycol-Propyl Glycol (SYSTANE OP) Place 1 drop into both eyes daily as needed (dry eyes).    [provider]  potassium chloride SA (KLOR-CON M) 10 MEQ tablet Take 1 tablet (10 mEq total) by mouth daily. 01/28/22   Eileen Stanford, PA-C  Probiotic Product (PROBIOTIC ADVANCED PO) Take 1 capsule by mouth daily.    [provider]  tobramycin (TOBREX) 0.3 % ophthalmic solution Place 1 drop into the right eye See admin instructions. Instill 1 drop into the right eye three times daily, the day before, day of and day after eye injections 04/15/20   [provider]  traZODone (DESYREL) 50 MG tablet Take 0.5-1 tablets (25-50 mg total) by mouth at bedtime as needed for sleep. Patient taking differently: Take 50 mg by mouth at bedtime as needed for sleep. 04/15/21   Janora Norlander, DO  TURMERIC PO Take 1 capsule by mouth 2 (two) times daily.    [provider]  umeclidinium-vilanterol (ANORO ELLIPTA) 62.5-25 MCG/ACT AEPB Inhale 1 puff into the lungs daily at 6 (six) AM. 11/01/21   Janora Norlander, DO     Allergies:     Allergies  Allergen Reactions   Penicillins Hives   Singulair [Montelukast Sodium]     headache   Codeine Rash   Nicoderm [Nicotine] Hives  and Swelling   Other Rash    Band-aids cause rash after a few days     Physical Exam:   Vitals  Blood pressure (!) 163/110, pulse 78, temperature 97.6 F (36.4 C), temperature source Oral, resp. rate 16, SpO2 90 %.   1. General elderly female, lying in bed in no apparent distress  2. Normal affect and insight, Not Suicidal or Homicidal, Awake Alert, Oriented X 3.  She is slow to respond, but appropriate  3. No F.N deficits, ALL C.Nerves Intact, Strength 5/5 all 4 extremities, Sensation intact all 4 extremities, Plantars down going.  4. Ears and Eyes appear Normal, Conjunctivae clear, decreased visual acuity bilaterally.  5. Supple Neck, No JVD, No cervical lymphadenopathy appriciated, No Carotid Bruits.  6. Symmetrical Chest wall movement, Good air movement bilaterally, CTAB.  Right upper chest surgical wound with significant bruising, sutures present, wound looks clean, right groin wound looks clean.  7.  Irregular irregular, No Gallops, Rubs or Murmurs, No Parasternal Heave.  8. Positive Bowel Sounds, Abdomen Soft, No tenderness, No organomegaly appriciated,No rebound -guarding or rigidity.  9.  No Cyanosis, Normal Skin Turgor, No Skin Rash or Bruise.  10. Good muscle tone,  joints appear normal , no effusions, Normal ROM.  11. No Palpable Lymph Nodes in Neck or Axillae   Data Review:    CBC Recent Labs  Lab 02/01/22 1249 02/01/22 1329 02/01/22 1443 02/02/22 0204 02/04/22 1250  WBC  --   --   --  10.3 11.6*  HGB 11.9* 12.2 14.3 12.6 13.5  HCT 35.0* 36.0 42.0 37.5 39.5  PLT  --   --   --  194 152  MCV  --   --   --  97.4 96.6  MCH  --   --   --  32.7 33.0  MCHC  --   --   --  33.6 34.2  RDW  --   --   --  13.7 13.2   ------------------------------------------------------------------------------------------------------------------  Chemistries  Recent Labs  Lab 02/01/22 1249 02/01/22 1329 02/01/22 1443 02/02/22 0204 02/04/22 1250  NA 137 138 138 136  133*  K 3.7 3.8 3.9 4.1 3.3*  CL 102 101 103 102 97*  CO2  --   --   --  26 31  GLUCOSE 104* 108* 115* 163* 109*  BUN '16 16 16 19 11  '$ CREATININE 0.40* 0.30* 0.40* 0.57 0.51  CALCIUM  --   --   --  8.6* 8.5*  MG  --   --   --  1.9  --   AST  --   --   --   --  49*  ALT  --   --   --   --  74*  ALKPHOS  --   --   --   --  145*  BILITOT  --   --   --   --  1.1   ------------------------------------------------------------------------------------------------------------------ estimated creatinine clearance is 51.5 mL/min (by C-G formula based on SCr of 0.51 mg/dL). ------------------------------------------------------------------------------------------------------------------ No results for input(s): "TSH", "T4TOTAL", "T3FREE", "THYROIDAB" in the last 72 hours.  Invalid input(s): "FREET3"  Coagulation profile Recent Labs  Lab 02/04/22 1302  INR 1.1   ------------------------------------------------------------------------------------------------------------------- No results for input(s): "DDIMER" in the last 72 hours. -------------------------------------------------------------------------------------------------------------------  Cardiac Enzymes No results for input(s): "CKMB", "TROPONINI", "MYOGLOBIN" in the last 168 hours.  Invalid input(s): "CK" ------------------------------------------------------------------------------------------------------------------    Component Value Date/Time   BNP 338.5 (H) 09/16/2021 1238   BNP 676.0 (H) 09/10/2021 1626     ---------------------------------------------------------------------------------------------------------------  Urinalysis    Component Value Date/Time   COLORURINE AMBER (A) 01/28/2022 1336   APPEARANCEUR HAZY (A) 01/28/2022 1336   LABSPEC 1.018 01/28/2022 1336   PHURINE 6.0 01/28/2022 1336   GLUCOSEU NEGATIVE 01/28/2022 1336   [place] 01/28/2022 1336   [place] 01/28/2022 1336    [place] 01/28/2022 1336   PROTEINUR 30 (A) 01/28/2022 1336   NITRITE NEGATIVE 01/28/2022 1336   LEUKOCYTESUR TRACE (A) 01/28/2022 1336    ----------------------------------------------------------------------------------------------------------------   Imaging Results:    MR BRAIN WO CONTRAST  Result Date: 02/04/2022 CLINICAL DATA:  Neuro deficit, acute, stroke suspected; VISION DEFICIT EXAM: MRI HEAD WITHOUT CONTRAST TECHNIQUE: Multiplanar, multiecho pulse sequences of the brain and surrounding structures were obtained without intravenous contrast. COMPARISON:  None Available. FINDINGS: Brain: Few small scattered foci of diffusion hyperintensity including left frontal cortex, left posterior temporal cortex, and bilateral cerebellum. No evidence of intracranial hemorrhage. Ventricles and sulci are within normal limits in size and configuration. Patchy and confluent areas of T2 hyperintensity in the supratentorial and pontine white matter are nonspecific but may reflect mild to moderate chronic microvascular ischemic changes. Vascular: Major vessel flow voids at the skull base are preserved. Skull and upper cervical spine: Normal marrow signal is preserved. Sinuses/Orbits: Paranasal sinuses are aerated. Orbits are unremarkable. Other: Sella is unremarkable.  Mastoid air cells are clear. IMPRESSION: Few punctate acute to subacute infarcts involving different vascular territories. No hemorrhage. Chronic  microvascular ischemic changes. Electronically Signed   By: Macy Mis M.D.   On: 02/04/2022 16:21   CT ANGIO HEAD NECK W WO CM  Result Date: 02/04/2022 CLINICAL DATA:  Neuro deficit, acute, stroke suspected EXAM: CT ANGIOGRAPHY HEAD AND NECK TECHNIQUE: Multidetector CT imaging of the head and neck was performed using the standard protocol during bolus administration of intravenous contrast. Multiplanar CT image reconstructions and MIPs were obtained to evaluate the vascular anatomy. Carotid  stenosis measurements (when applicable) are obtained utilizing NASCET criteria, using the distal internal carotid diameter as the denominator. RADIATION DOSE REDUCTION: This exam was performed according to the departmental dose-optimization program which includes automated exposure control, adjustment of the mA and/or kV according to patient size and/or use of iterative reconstruction technique. CONTRAST:  120m OMNIPAQUE IOHEXOL 350 MG/ML SOLN COMPARISON:  None Available. FINDINGS: CT HEAD FINDINGS Brain: No evidence of acute large vascular territory infarction, hemorrhage, hydrocephalus, extra-axial collection or mass lesion/mass effect. Patchy white matter hypodensities, nonspecific but compatible with chronic microvascular ischemic disease. Vascular: Detailed below. Skull: No acute fracture. Sinuses: Visualized sinuses are clear. Orbits: No acute finding. Review of the MIP images confirms the above findings CTA NECK FINDINGS Aortic arch: Aortic atherosclerosis. Great vessel origins are patent with mild-to-moderate atherosclerotic narrowing. Apparent linear filling defect within the brachiocephalic artery is favored artifactual given artifact this region. Right carotid system: Atherosclerosis at the carotid bifurcation without greater than 50% stenosis. Left carotid system: Atherosclerosis involving the common carotid artery and at the carotid bifurcation without greater than 50% stenosis. Vertebral arteries: Moderate to severe left vertebral artery origin stenosis. Otherwise, vertebral arteries are patent without significant stenosis. Skeleton: Severe multilevel degenerative disc disease. Multilevel facet uncovertebral hypertrophy with varying degrees of neural foraminal stenosis. Other neck: No acute findings. Upper chest: Emphysema. Review of the MIP images confirms the above findings CTA HEAD FINDINGS Anterior circulation: Bilateral intracranial ICAs, MCAs, and ACAs are patent without proximal hemodynamically  significant stenosis Posterior circulation: Bilateral intradural vertebral arteries, basilar artery and bilateral posterior cerebral arteries are patent without proximal hemodynamically significant stenosis. No aneurysm identified. Venous sinuses: As permitted by contrast timing, patent. Review of the MIP images confirms the above findings IMPRESSION: 1. No emergent large vessel occlusion. 2. 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 3. Bilateral carotid bifurcation atherosclerosis without greater than 50% stenosis. 4. Aortic Atherosclerosis (ICD10-I70.0) and Emphysema (ICD10-J43.9). Electronically Signed   By: FMargaretha SheffieldM.D.   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 -CTA head and neck with no emergent LVO, but significant for moderate to severe left vertebral artery origin stenosis, and bilateral carotid bifurcation atherosclerosis. -Patient Eliquis has been on hold for recent TAVR, this is most likely contributing to her acute CVA. -Admitted under CVA pathway, will obtain PT/OT/SLP, monitoring telemetry, check A1c and lipid panel -She is on Eliquis at home which she has resumed for the last 2 days, I have discussed with neurology regarding risks and benefits for hemoconversion, I risks of holding it out with the benefit, given she had acute  CVA from holding it, and her lesions are small, so recommendation is to continue Eliquis for now. -Neurology to evaluate at [facility].  Poor vision -Patient with baseline poor vision and right eye secondary to retinal  detachment, but appears to be significantly worse during current CVA.  Hypertension -Hold meds and allow for permissive hypertension  Hypothyroidism -Continue with home regimen  Severe aortic stenosis status post recent TAVR earlier in the week -We will have rounding team at [facility] notify CT surgery about patient admission to evaluate sutures and wound  Tobacco abuse -Start nicotine patch  Hyponatremia -Continue with gentle hydration  Hypokalemia -Repleted   DVT Prophylaxis Eliquis  AM Labs Ordered, also please review Full Orders  Family Communication: Admission, patients condition and plan of care including tests being ordered have been discussed with the patient and sister* who indicate understanding and agree with the plan and Code Status.  Code Status Full  Likely DC to  home  Condition GUARDED    Consults called: D/W Tele NEurology    Admission status: inpatient    Time spent in minutes : 70 minutes   Phillips Climes M.D on 02/04/2022 at 5:23 PM   [facility] - Office  [phone]

## 2022-02-04 NOTE — ED Notes (Signed)
Pt top dentures in spit bag and nurse gave to pt sister. Pt unaware that she put dentures in bag.

## 2022-02-04 NOTE — ED Notes (Signed)
[facility] called for Transport. 

## 2022-02-04 NOTE — Plan of Care (Addendum)
Neurology plan of care  I was contacted by Dr. Phillips Climes admitting [facility] hospitalist regarding further guidance for this patient after she was admitted for encephalopathy and visual disturbance and MRI brain showed multifocal punctate areas of restricted diffusion.   She is a 71 yo woman with hx HTN, HL, tobacco abuse, a fib, and severe AS s/p TAVR on 01/31/22. Her eliquis for a fib was held for the procedure and started day of discharge from [facility] 02/01/22. She presented to [facility] with confusion, headache, and visual disturbance. Sx were present upon awakening today. Per Dr. Waldron Labs patient has very poor vision at baseline in R eye 2/2 macular degeneration. Initially she was felt to have possible new total blindness in her L eye, but hospitalist says that on further examination she is having difficulty reading, especially numbers, both close up and far away, but she does not have new onset unilateral blindness. It may be difficult to appreciate exactly what her new deficits are in the setting of pre-existing severe vision impairment and current encephalopathy.  MRI brain wo contrast was performed on admission which showed a few punctate (1-89m) areas of restricted diffusion c/w acute or subacute tiny ischemic infarcts (L frontal, L temporal, bilat cerebellum - personal review). There is nothing on this brain MRI to explain encephalopathy or visual field defects. Punctate areas of restricted diffusion are common after TAVR and are often subclinical; also patient was off her eliquis prior to procedure until POD1. TTE before and after TAVR earlier this week showed no e/o intracardiac clot. CTA H&N today showed moderate to severe L vert stenosis, bilat carotid stenosis <50%.  If she does indeed have unilateral vision loss she may have suffered a CRAO in the setting of having to hold anticoagulation for procedure. Given her complex presentation and superimposed encephalopathy I feel transfer is warranted to [facility]  so that she can be examined by a neurologist at bedside who can give further informed guidance. In the meantime I recommended continuing her eliquis given the extremely small size of the punctate infarcts on MRI and her ongoing risk for embolic stroke in the setting of a fib.  I will place her on neurology's expected list for [facility], [facility].  CSu Monks MD [facility] [phone] If 7pm- 7am, please page neurology on call as listed [place]

## 2022-02-04 NOTE — ED Notes (Signed)
Pt used restroom and forgot sample. Nurse notified.

## 2022-02-04 NOTE — Consult Note (Addendum)
Stroke Neurology Consultation Note  Consult Requested by: Dr. Waldron Labs  Reason for Consult: confusion and vision disturbance  Consult Date: 02/04/22   The history was obtained from the pt and chart.  During history and examination, all items were able to obtain unless otherwise noted.  History of Present Illness:  Tammie Martin is a 71 y.o. Caucasian female with PMH of HTN, HLD, tobacco abuse, a fib on eliquis was on hold for TAVR but restarted on 6/6, and severe AS s/p TAVR on 01/31/22 admitted for confusion, HA and vision disturbance.   I saw pt tonight after she transferred from [facility]. She was sleeping on my arrival but easily arousable. However, she stated that she really wanted to sleep but was able to bare with me for a short period of time. She stated that she was confused from yesterday and can not tell me more about what happened. She can not remember what she was doing when the confusion started. She denies any HA at this moment. She said she still has some blurry vision but not too bad. She apparently disorientated at this time but also perseveration on exam, but blinking to visual threat bilaterally.   Per chart, she had confusion, headache and visual disturbance on awakening today.  She had baseline poor vision on the right eye due to macular degeneration but still able to drive with her left eye.  On Dr. Graciela Husbands exam, patient kept counting 1 finger despite different fingers showing to her, however, she was able to name different objects from distance with left eye.   MRI brain showed a few punctate (1-71m) areas of restricted diffusion c/w acute or subacute tiny ischemic infarcts (L frontal, L temporal, bilat cerebellum). CTA H&N showed moderate to severe L vert stenosis, bilat carotid stenosis <50%. She had TAVR on 01/31/22 and TTE and TEE showed normal EF and LAA low velocity but no LV or LA thrombus. She is back to eliquis on 6/6.   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 CoBurt Knacknd Dr. BaCyndia Bent Severe aortic stenosis    Thyroid disease    Tobacco abuse     Past Surgical History:  Procedure Laterality Date   AORTIC VALVE REPLACEMENT     APPENDECTOMY     pt recently had CT that said Appendix was there so she is unsure if this was actually removed during hysterectomy as she was previously told   BREAST ENHANCEMENT SURGERY     CARDIOVERSION N/A 10/27/2021   Procedure: CARDIOVERSION;  Surgeon: BrArnoldo LenisMD;  Location: [facility];  Service: Endoscopy;  Laterality: N/A;   INTRAOPERATIVE TRANSESOPHAGEAL ECHOCARDIOGRAM N/A 02/01/2022   Procedure: INTRAOPERATIVE TRANSESOPHAGEAL ECHOCARDIOGRAM;  Surgeon: CoSherren MochaMD;  [place] [facility] Service: Open Heart Surgery;  Laterality: N/A;   RIGHT/LEFT HEART CATH AND CORONARY ANGIOGRAPHY N/A 11/22/2021   Procedure: RIGHT/LEFT HEART CATH AND CORONARY ANGIOGRAPHY;  Surgeon: CoSherren MochaMD;  Location: [place] [facility];  Service: Cardiovascular;  Laterality: N/A;   TONSILLECTOMY     VAGINAL HYSTERECTOMY      Family History  Problem Relation Age of Onset   Cancer Mother        breast cancer at 62    Kidney disease Mother    Cancer Maternal Aunt        colon cancer   Heart attack Father    Diabetes Sister    Obesity Sister    Depression Sister    Alcohol abuse Brother    Cirrhosis Brother    Depression Brother    Liver disease Brother    Diabetes Daughter    Fibromyalgia Daughter    Depression Daughter    Anxiety  disorder Daughter    Arthritis Daughter    Depression Sister    Obesity Sister     Social History:  reports that she has quit smoking. Her smoking use included cigarettes. She has a 40.00 pack-year smoking history. She has never used smokeless tobacco. She reports that she does not currently use alcohol. She reports that she does not use drugs.  Allergies:  Allergies  Allergen Reactions   Penicillins Hives   Singulair [Montelukast Sodium]     headache   Codeine Rash   Nicoderm [Nicotine] Hives and Swelling   Other Rash    Band-aids cause rash after a few days    No current facility-administered medications on file prior to encounter.   Current Outpatient Medications on File Prior to Encounter  Medication Sig Dispense Refill   albuterol (VENTOLIN HFA) 108 (90 Base) MCG/ACT inhaler Inhale 2 puffs into the lungs every 6 (six) hours as needed for wheezing or shortness of breath. 8 g 0   atorvastatin (LIPITOR) 20 MG tablet Take 1 tablet (20 mg total) by mouth daily. 90 tablet 0   bisoprolol (ZEBETA) 10 MG tablet Take 1.5 tablets (15 mg total) by mouth daily. 135 tablet 3   cetirizine (ZYRTEC) 10 MG tablet Take 10 mg by mouth daily.     ELIQUIS 5 MG TABS tablet TAKE ONE TABLET BY MOUTH TWICE DAILY 60 tablet 0   furosemide (LASIX) 20 MG tablet TAKE ONE TABLET BY MOUTH DAILY (Patient taking differently: Take 20 mg by mouth daily.) 30 tablet 0   levothyroxine (SYNTHROID) 88 MCG tablet Take 1 tablet by mouth daily. (Patient taking differently: Take 88 mcg by mouth daily before breakfast.) 90 tablet 2   acetaminophen (TYLENOL) 500 MG tablet Take 1,000 mg by mouth every 6 (six) hours as needed for moderate pain.     CALCIUM-MAGNESIUM PO Take 1 tablet by mouth daily.     Cholecalciferol (VITAMIN D) 50 MCG (2000 UT) tablet Take 2,000 Units by mouth daily.     desvenlafaxine (PRISTIQ) 50 MG 24 hr tablet Take 1 tablet (50 mg total) by mouth daily. 90 tablet 1   fluticasone (FLONASE) 50 MCG/ACT  nasal spray Place 2 sprays into both nostrils daily as needed for allergies or rhinitis. 48 g 3   hydrocortisone cream 1 % Apply 1 application. topically 2 (two) times daily as needed (rash).     Multiple Vitamins-Minerals (ALIVE WOMENS 50+ PO) Take 1 tablet by mouth daily.     Multiple Vitamins-Minerals (PRESERVISION AREDS 2 PO) Take 1 capsule by mouth in the morning and at bedtime.     Omega-3-6-9 CAPS Take 1 capsule by mouth 2 (two) times daily.     Polyethyl Glycol-Propyl Glycol (SYSTANE OP) Place  1 drop into both eyes daily as needed (dry eyes).     potassium chloride SA (KLOR-CON M) 10 MEQ tablet Take 1 tablet (10 mEq total) by mouth daily. 30 tablet 3   Probiotic Product (PROBIOTIC ADVANCED PO) Take 1 capsule by mouth daily.     tobramycin (TOBREX) 0.3 % ophthalmic solution Place 1 drop into the right eye See admin instructions. Instill 1 drop into the right eye three times daily, the day before, day of and day after eye injections     traZODone (DESYREL) 50 MG tablet Take 0.5-1 tablets (25-50 mg total) by mouth at bedtime as needed for sleep. (Patient taking differently: Take 50 mg by mouth at bedtime as needed for sleep.) 90 tablet 3   TURMERIC PO Take 1 capsule by mouth 2 (two) times daily.     umeclidinium-vilanterol (ANORO ELLIPTA) 62.5-25 MCG/ACT AEPB Inhale 1 puff into the lungs daily at 6 (six) AM. 60 each 12    Review of Systems: A full ROS was attempted today and was able to be performed.  Systems assessed include - Constitutional, Eyes, HENT, Respiratory, Cardiovascular, Gastrointestinal, Genitourinary, Integument/breast, Hematologic/lymphatic, Musculoskeletal, Neurological, Behavioral/Psych, Endocrine, Allergic/Immunologic - with pertinent responses as per HPI.  Physical Examination: Temp:  [97.6 F (36.4 C)-98.6 F (37 C)] 98.6 F (37 C) (06/09 2238) Pulse Rate:  [67-145] 81 (06/09 2130) Resp:  [16-33] 22 (06/09 2130) BP: (142-172)/(78-110) 148/105 (06/09 2130) SpO2:  [86  %-96 %] 94 % (06/09 2130) Weight:  [52.1 kg] 52.1 kg (06/09 2238)  General - well nourished, well developed, in no apparent distress, sleepy.    Ophthalmologic - fundi not visualized due to noncooperation.    Cardiovascular - irregularly irregular heart rate and rhythm.  Neuro - sleepy but arousable but needed repetitive stimulation to keep awake, eyes open, orientated to place only. No aphasia, following all simple commands. Able to repeat, but naming with perseveration. No gaze palsy, tracking bilaterally, blinking to visual threat bilaterally. Left eye count fingers perseverated with "1", once told her I was holding two fingers and then she was perseverated on "2". No facial droop. Tongue midline. Bilateral UEs and LEs at least 4/5, no drift. Sensation symmetrical bilaterally, b/l FTN intact, gait not tested.    Data Reviewed: MR BRAIN WO CONTRAST  Result Date: 02/04/2022 CLINICAL DATA:  Neuro deficit, acute, stroke suspected; VISION DEFICIT EXAM: MRI HEAD WITHOUT CONTRAST TECHNIQUE: Multiplanar, multiecho pulse sequences of the brain and surrounding structures were obtained without intravenous contrast. COMPARISON:  None Available. FINDINGS: Brain: Few small scattered foci of diffusion hyperintensity including left frontal cortex, left posterior temporal cortex, and bilateral cerebellum. No evidence of intracranial hemorrhage. Ventricles and sulci are within normal limits in size and configuration. Patchy and confluent areas of T2 hyperintensity in the supratentorial and pontine white matter are nonspecific but may reflect mild to moderate chronic microvascular ischemic changes. Vascular: Major vessel flow voids at the skull base are preserved. Skull and upper cervical spine: Normal marrow signal is preserved. Sinuses/Orbits: Paranasal sinuses are aerated. Orbits are unremarkable. Other: Sella is unremarkable.  Mastoid air cells are clear. IMPRESSION: Few punctate acute to subacute infarcts  involving different vascular territories. No hemorrhage. Chronic microvascular ischemic changes. Electronically Signed   By: Macy Mis M.D.   On: 02/04/2022 16:21   CT ANGIO HEAD NECK W WO CM  Result Date: 02/04/2022 CLINICAL DATA:  Neuro deficit, acute, stroke suspected EXAM: CT ANGIOGRAPHY HEAD AND NECK TECHNIQUE: Multidetector CT imaging of the head and neck  was performed using the standard protocol during bolus administration of intravenous contrast. Multiplanar CT image reconstructions and MIPs were obtained to evaluate the vascular anatomy. Carotid stenosis measurements (when applicable) are obtained utilizing NASCET criteria, using the distal internal carotid diameter as the denominator. RADIATION DOSE REDUCTION: This exam was performed according to the departmental dose-optimization program which includes automated exposure control, adjustment of the mA and/or kV according to patient size and/or use of iterative reconstruction technique. CONTRAST:  161m OMNIPAQUE IOHEXOL 350 MG/ML SOLN COMPARISON:  None Available. FINDINGS: CT HEAD FINDINGS Brain: No evidence of acute large vascular territory infarction, hemorrhage, hydrocephalus, extra-axial collection or mass lesion/mass effect. Patchy white matter hypodensities, nonspecific but compatible with chronic microvascular ischemic disease. Vascular: Detailed below. Skull: No acute fracture. Sinuses: Visualized sinuses are clear. Orbits: No acute finding. Review of the MIP images confirms the above findings CTA NECK FINDINGS Aortic arch: Aortic atherosclerosis. Great vessel origins are patent with mild-to-moderate atherosclerotic narrowing. Apparent linear filling defect within the brachiocephalic artery is favored artifactual given artifact this region. Right carotid system: Atherosclerosis at the carotid bifurcation without greater than 50% stenosis. Left carotid system: Atherosclerosis involving the common carotid artery and at the carotid bifurcation  without greater than 50% stenosis. Vertebral arteries: Moderate to severe left vertebral artery origin stenosis. Otherwise, vertebral arteries are patent without significant stenosis. Skeleton: Severe multilevel degenerative disc disease. Multilevel facet uncovertebral hypertrophy with varying degrees of neural foraminal stenosis. Other neck: No acute findings. Upper chest: Emphysema. Review of the MIP images confirms the above findings CTA HEAD FINDINGS Anterior circulation: Bilateral intracranial ICAs, MCAs, and ACAs are patent without proximal hemodynamically significant stenosis Posterior circulation: Bilateral intradural vertebral arteries, basilar artery and bilateral posterior cerebral arteries are patent without proximal hemodynamically significant stenosis. No aneurysm identified. Venous sinuses: As permitted by contrast timing, patent. Review of the MIP images confirms the above findings IMPRESSION: 1. No emergent large vessel occlusion. 2. 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 3. Bilateral carotid bifurcation atherosclerosis without greater than 50% stenosis. 4. Aortic Atherosclerosis (ICD10-I70.0) and Emphysema (ICD10-J43.9). Electronically Signed   By: FMargaretha SheffieldM.D.   On: 02/04/2022 14:31   ECHOCARDIOGRAM COMPLETE  Result Date: 02/02/2022    ECHOCARDIOGRAM REPORT   Patient Name:   Tammie BRIDGERSDate of Exam: 02/02/2022 Medical Rec #:  [id number]        Height:       62.5 in Accession #:    [id number]       Weight:       125.7 lb Date of Birth:  606-Jun-1952        BSA:          1.578 m Patient Age:    89years          BP:           137/87 mmHg Patient Gender: F                 HR:           95 bpm. 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 COPD, Aortic Valve Disease,  Arrythmias:Atrial  Fibrillation; Risk Factors:Hypertension, Dyslipidemia and                 Current Smoker. Thyroid disease.                 Aortic Valve: 23 mm Sapien prosthetic, stented (TAVR) valve is                 present in the aortic position. Procedure Date: 01/01/2022.  Sonographer:    Darlina Sicilian RDCS Referring Phys: [id number] KATHRYN R THOMPSON IMPRESSIONS  1. 23 mm S3. Mild paravalvular leak. Vmax 1.6 m/s, MG 5.0 mmHG, EOA 2.0 cm2, DI 0.55. Prosthesis within normal limits. The aortic valve has been repaired/replaced. Aortic valve regurgitation is mild. There is a 23 mm Sapien prosthetic (TAVR) valve present in the aortic position. Procedure Date: 01/01/2022.  2. Left ventricular ejection fraction, by estimation, is 60 to 65%. The left ventricle has normal function. The left ventricle has no regional wall motion abnormalities. Left ventricular diastolic function could not be evaluated.  3. Right ventricular systolic function is normal. The right ventricular size is mildly enlarged. There is severely elevated pulmonary artery systolic pressure.  4. Left atrial size was severely dilated.  5. Right atrial size was severely dilated.  6. The mitral valve is degenerative. Trivial mitral valve regurgitation. Mild mitral stenosis. The mean mitral valve gradient is 4.0 mmHg with average heart rate of 80 bpm. Moderate to severe mitral annular calcification.  7. The inferior vena cava is dilated in size with <50% respiratory variability, suggesting right atrial pressure of 15 mmHg. FINDINGS  Left Ventricle: Left ventricular ejection fraction, by estimation, is 60 to 65%. The left ventricle has normal function. The left ventricle has no regional wall motion abnormalities. The left ventricular internal cavity size was normal in size. There is  no left ventricular hypertrophy. Left ventricular diastolic function could not be evaluated due to atrial fibrillation. Left ventricular diastolic function  could not be evaluated. Right Ventricle: The right ventricular size is mildly enlarged. No increase in right ventricular wall thickness. Right ventricular systolic function is normal. There is severely elevated pulmonary artery systolic pressure. The tricuspid regurgitant velocity is 3.62 m/s, and with an assumed right atrial pressure of 15 mmHg, the estimated right ventricular systolic pressure is 71.2 mmHg. Left Atrium: Left atrial size was severely dilated. Right Atrium: Right atrial size was severely dilated. Pericardium: There is no evidence of pericardial effusion. Mitral Valve: The mitral valve is degenerative in appearance. Moderate to severe mitral annular calcification. Trivial mitral valve regurgitation. Mild mitral valve stenosis. MV peak gradient, 7.7 mmHg. The mean mitral valve gradient is 4.0 mmHg with average heart rate of 80 bpm. Tricuspid Valve: The tricuspid valve is grossly normal. Tricuspid valve regurgitation is mild. Aortic Valve: 23 mm S3. Mild paravalvular leak. Vmax 1.6 m/s, MG 5.0 mmHG, EOA 2.0 cm2, DI 0.55. Prosthesis within normal limits. The aortic valve has been repaired/replaced. Aortic valve regurgitation is mild. Aortic valve mean gradient measures 5.0 mmHg. Aortic valve peak gradient measures 10.1 mmHg. Aortic valve area, by VTI measures 2.00 cm. There is a 23 mm Sapien prosthetic, stented (TAVR) valve present in the aortic position. Procedure Date: 01/01/2022. Pulmonic Valve: The pulmonic valve was grossly normal. Pulmonic valve regurgitation is trivial. No evidence of pulmonic stenosis. Aorta: The aortic root and ascending aorta are structurally normal, with no evidence of dilitation. Venous: The inferior vena cava is dilated in size with less than 50% respiratory variability, suggesting right atrial pressure  of 15 mmHg. IAS/Shunts: The atrial septum is grossly normal.  LEFT VENTRICLE PLAX 2D LVOT diam:     2.15 cm   Diastology LV SV:         61        LV e' medial:    3.94 cm/s  LV SV Index:   39        LV E/e' medial:  32.0 LVOT Area:     3.63 cm  LV e' lateral:   11.20 cm/s                          LV E/e' lateral: 11.3  RIGHT VENTRICLE RV S prime:     9.38 cm/s TAPSE (M-mode): 2.0 cm LEFT ATRIUM              Index        RIGHT ATRIUM           Index LA Vol (A2C):   129.0 ml 81.73 ml/m  RA Area:     31.20 cm LA Vol (A4C):   128.0 ml 81.09 ml/m  RA Volume:   100.00 ml 63.35 ml/m LA Biplane Vol: 130.0 ml 82.36 ml/m  AORTIC VALVE AV Area (Vmax):    1.84 cm AV Area (Vmean):   1.93 cm AV Area (VTI):     2.00 cm AV Vmax:           159.00 cm/s AV Vmean:          106.500 cm/s AV VTI:            0.306 m AV Peak Grad:      10.1 mmHg AV Mean Grad:      5.0 mmHg LVOT Vmax:         80.40 cm/s LVOT Vmean:        56.650 cm/s LVOT VTI:          0.168 m LVOT/AV VTI ratio: 0.55  AORTA Ao Asc diam: 3.15 cm MITRAL VALVE                TRICUSPID VALVE MV Area (PHT): 2.90 cm     TR Peak grad:   52.4 mmHg MV Peak grad:  7.7 mmHg     TR Vmax:        362.00 cm/s MV Mean grad:  4.0 mmHg MV Vmax:       1.39 m/s     SHUNTS MV Vmean:      89.0 cm/s    Systemic VTI:  0.17 m MV Decel Time: 262 msec     Systemic Diam: 2.15 cm MV E velocity: 126.00 cm/s Eleonore Chiquito MD Electronically signed by Eleonore Chiquito MD Signature Date/Time: 02/02/2022/11:47:25 AM    Final    ECHO TEE  Result Date: 02/01/2022    TRANSESOPHOGEAL ECHO REPORT   Patient Name:   Tammie Martin Date of Exam: 02/01/2022 Medical Rec #:  [id number]         Height:       62.5 in Accession #:    [id number]        Weight:       120.0 lb Date of Birth:  Jan 11, 1951         BSA:          1.548 m Patient Age:    28 years          BP:  110/97 mmHg Patient Gender: F                 HR:           91 bpm. Exam Location:  Inpatient Procedure: Transesophageal Echo, Cardiac Doppler and Color Doppler Indications:     Aortic stenosis, severe  History:         Patient has prior history of Echocardiogram examinations. COPD,                  Aortic Valve  Disease, Arrythmias:Atrial Fibrillation; Risk                  Factors:Hypertension, Dyslipidemia and Current Smoker. Thyroid                  disease.                  Aortic Valve: 23 mm Sapien prosthetic, stented (TAVR) valve is                  present in the aortic position. Procedure Date: 02/01/2022.  Sonographer:     Darlina Sicilian RDCS Referring Phys:  [place] Diagnosing Phys: Eleonore Chiquito MD PROCEDURE: After discussion of the risks and benefits of a TEE, an informed consent was obtained from the patient. The patient was intubated. The transesophogeal probe was passed without difficulty through the esophogus of the patient. Imaged were obtained with the patient in a supine position. Sedation performed by different physician. The patient was monitored while under deep sedation. Anesthestetic sedation was provided intravenously by Anesthesiology: '90mg'$  of Propofol, '60mg'$  of Lidocaine. Image quality was excellent. The patient's vital signs; including heart rate, blood pressure, and oxygen saturation; remained stable throughout the procedure. The patient developed no complications during the procedure. IMPRESSIONS  1. TEE guided TAVR. 23 mm S3 deployed. Vmax 1.2 m/s, MG 2.0 mmHG, EOA 2.77 cm2, DI 0.80. Trivial PVL in the 12-1 o'clock position. Normal prosthesis. The aortic valve has been repaired/replaced. Aortic valve regurgitation is trivial. There is a 23 mm Sapien prosthetic (TAVR) valve present in the aortic position. Procedure Date: 02/01/2022.  2. LAA sludge present. Low LAA velocity noted ~22 cm/s. Left atrial size was severely dilated. A left atrial/left atrial appendage thrombus was detected.  3. Left ventricular ejection fraction, by estimation, is 60 to 65%. The left ventricle has normal function.  4. Right ventricular systolic function is normal. The right ventricular size is mildly enlarged.  5. Right atrial size was severely dilated.  6. The mitral valve is degenerative. Mild mitral  valve regurgitation. No evidence of mitral stenosis. Moderate to severe mitral annular calcification.  7. Tricuspid valve regurgitation is mild to moderate. FINDINGS  Left Ventricle: Left ventricular ejection fraction, by estimation, is 60 to 65%. The left ventricle has normal function. The left ventricular internal cavity size was normal in size. Right Ventricle: The right ventricular size is mildly enlarged. No increase in right ventricular wall thickness. Right ventricular systolic function is normal. Left Atrium: LAA sludge present. Low LAA velocity noted ~22 cm/s. Left atrial size was severely dilated. A left atrial/left atrial appendage thrombus was detected. Right Atrium: Right atrial size was severely dilated. Pericardium: Trivial pericardial effusion is present. Mitral Valve: The mitral valve is degenerative in appearance. Moderate to severe mitral annular calcification. Mild mitral valve regurgitation. No evidence of mitral valve stenosis. Tricuspid Valve: The tricuspid valve is grossly normal. Tricuspid valve regurgitation is mild to moderate. No evidence of  tricuspid stenosis. 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 Vmax 1.2 m/s, MG 2.0 mmHG, EOA 2.77 cm2, DI 0.80. Trivial PVL in the 12-1 o'clock position. Normal prosthesis. The aortic valve has been repaired/replaced. Aortic valve regurgitation is trivial. Aortic regurgitation PHT measures 693 msec. Aortic valve mean gradient measures 2.0 mmHg. Aortic valve peak gradient measures 5.7 mmHg. Aortic valve area, by VTI measures 2.77 cm. There is a 23 mm Sapien prosthetic, stented (TAVR) valve present in the aortic position. Procedure Date: 02/01/2022. Pulmonic Valve: The pulmonic valve was grossly normal. Pulmonic valve regurgitation is not visualized. No evidence of pulmonic stenosis. Aorta: The aortic root and ascending aorta are structurally normal, with no evidence of dilitation. There is minimal (Grade I) layered plaque involving the  descending aorta. IAS/Shunts: The atrial septum is grossly normal.  LEFT VENTRICLE PLAX 2D LVOT diam:     2.10 cm LV SV:         59 LV SV Index:   38 LVOT Area:     3.46 cm  AORTIC VALVE AV Area (Vmax):    2.47 cm AV Area (Vmean):   1.22 cm AV Area (VTI):     2.77 cm AV Vmax:           119.00 cm/s AV Vmean:          158.100 cm/s AV VTI:            0.211 m AV Peak Grad:      5.7 mmHg AV Mean Grad:      2.0 mmHg LVOT Vmax:         84.70 cm/s LVOT Vmean:        55.900 cm/s LVOT VTI:          0.169 m LVOT/AV VTI ratio: 0.80 AI PHT:            693 msec  AORTA Ao Root diam: 3.14 cm Ao Asc diam:  2.90 cm TRICUSPID VALVE TR Peak grad:   27.5 mmHg TR Vmax:        262.00 cm/s  SHUNTS Systemic VTI:  0.17 m Systemic Diam: 2.10 cm Eleonore Chiquito MD Electronically signed by Eleonore Chiquito MD Signature Date/Time: 02/01/2022/2:26:22 PM    Final    Structural Heart Procedure  Result Date: 02/01/2022 See surgical note for result.  DG Chest 2 View  Result Date: 01/30/2022 CLINICAL DATA:  Preop for aortic valve replacement. EXAM: CHEST - 2 VIEW COMPARISON:  Two-view chest x-ray a E 09/10/2021 FINDINGS: The heart is enlarged. No edema or effusion is present. Mitral annular calcifications are present. Changes of COPD noted. No edema or effusion is present. No focal airspace disease is present. Bilateral breast implants noted. IMPRESSION: Cardiomegaly without failure. Electronically Signed   By: San Morelle M.D.   On: 01/30/2022 07:24    Assessment: 72 y.o. female with PMH of HTN, HLD, tobacco abuse, a fib on eliquis was on hold for TAVR but restarted on 02/01/22, and severe AS s/p TAVR on 01/31/22 admitted for confusion, HA and vision disturbance. However, pt can not give much history this time due to sleepiness. May need further questions in the morning. On exam, pt moving all extremities but perseveration on finger counting and naming, more concerning for encephalopathy, could be from metabolic condition or current  multifocal stroke. MRI brain showed a few punctate (1-39m) areas of restricted diffusion c/w acute or subacute tiny ischemic infarcts (L frontal, L temporal, bilat cerebellum). CTA H&N showed moderate to severe  L vert stenosis, bilat carotid stenosis <50%. Will need further EEG and metabolic work up, and re-evaluation in am. Stroke looks embolic, could be from recent cardiac procedure vs. Afib with holding eliquis.   Stroke Risk Factors - atrial fibrillation, hyperlipidemia, hypertension, and recent cardiac procedure.   Plan: - HgbA1c, fasting lipid panel - ammonia, TSH, B12, UA - EEG in am - re-evaluation in am when pt not sleepy  - OK to continue eliquis and statin - will follow  Thank you for this consultation and allowing [place] to participate in the care of this patient.  Rosalin Hawking, MD PhD Stroke Neurology 02/04/2022 11:44 PM

## 2022-02-05 ENCOUNTER — Inpatient Hospital Stay (HOSPITAL_COMMUNITY): Payer: Medicare HMO

## 2022-02-05 DIAGNOSIS — I6340 Cerebral infarction due to embolism of unspecified cerebral artery: Secondary | ICD-10-CM | POA: Diagnosis not present

## 2022-02-05 DIAGNOSIS — G9340 Encephalopathy, unspecified: Secondary | ICD-10-CM

## 2022-02-05 DIAGNOSIS — E785 Hyperlipidemia, unspecified: Secondary | ICD-10-CM

## 2022-02-05 DIAGNOSIS — I639 Cerebral infarction, unspecified: Secondary | ICD-10-CM

## 2022-02-05 DIAGNOSIS — I10 Essential (primary) hypertension: Secondary | ICD-10-CM | POA: Diagnosis not present

## 2022-02-05 DIAGNOSIS — R4182 Altered mental status, unspecified: Secondary | ICD-10-CM

## 2022-02-05 LAB — COMPREHENSIVE METABOLIC PANEL
ALT: 56 U/L — ABNORMAL HIGH (ref 0–44)
AST: 33 U/L (ref 15–41)
Albumin: 3.3 g/dL — ABNORMAL LOW (ref 3.5–5.0)
Alkaline Phosphatase: 142 U/L — ABNORMAL HIGH (ref 38–126)
Anion gap: 12 (ref 5–15)
BUN: 8 mg/dL (ref 8–23)
CO2: 25 mmol/L (ref 22–32)
Calcium: 9.2 mg/dL (ref 8.9–10.3)
Chloride: 99 mmol/L (ref 98–111)
Creatinine, Ser: 0.57 mg/dL (ref 0.44–1.00)
GFR, Estimated: >60 mL/min (ref >60)
Glucose, Bld: 112 mg/dL — ABNORMAL HIGH (ref 70–99)
Potassium: 4 mmol/L (ref 3.5–5.1)
Sodium: 136 mmol/L (ref 135–145)
Total Bilirubin: 1.2 mg/dL (ref 0.3–1.2)
Total Protein: 6.9 g/dL (ref 6.5–8.1)

## 2022-02-05 LAB — CBC
HCT: 39.6 % (ref 36.0–46.0)
Hemoglobin: 13.4 g/dL (ref 12.0–15.0)
MCH: 32.5 pg (ref 26.0–34.0)
MCHC: 33.8 g/dL (ref 30.0–36.0)
MCV: 96.1 fL (ref 80.0–100.0)
Platelets: 154 10*3/uL (ref 150–400)
RBC: 4.12 MIL/uL (ref 3.87–5.11)
RDW: 13.1 % (ref 11.5–15.5)
WBC: 12.9 10*3/uL — ABNORMAL HIGH (ref 4.0–10.5)
nRBC: 0 % (ref 0.0–0.2)

## 2022-02-05 LAB — HEMOGLOBIN A1C
Hgb A1c MFr Bld: 5.6 % (ref 4.8–5.6)
Mean Plasma Glucose: 114.02 mg/dL

## 2022-02-05 LAB — LIPID PANEL
Cholesterol: 135 mg/dL (ref 0–200)
Cholesterol: 133 mg/dL (ref 0–200)
HDL: 38 mg/dL — ABNORMAL LOW (ref >40)
HDL: 36 mg/dL — ABNORMAL LOW (ref >40)
LDL Cholesterol: 79 mg/dL (ref 0–99)
LDL Cholesterol: 79 mg/dL (ref 0–99)
Total CHOL/HDL Ratio: 3.6 RATIO
Total CHOL/HDL Ratio: 3.7 RATIO
Triglycerides: 88 mg/dL (ref <150)
Triglycerides: 89 mg/dL (ref <150)
VLDL: 18 mg/dL (ref 0–40)
VLDL: 18 mg/dL (ref 0–40)

## 2022-02-05 LAB — BASIC METABOLIC PANEL
Anion gap: 11 (ref 5–15)
BUN: 6 mg/dL — ABNORMAL LOW (ref 8–23)
CO2: 25 mmol/L (ref 22–32)
Calcium: 8.9 mg/dL (ref 8.9–10.3)
Chloride: 98 mmol/L (ref 98–111)
Creatinine, Ser: 0.48 mg/dL (ref 0.44–1.00)
GFR, Estimated: >60 mL/min (ref >60)
Glucose, Bld: 119 mg/dL — ABNORMAL HIGH (ref 70–99)
Potassium: 3.7 mmol/L (ref 3.5–5.1)
Sodium: 134 mmol/L — ABNORMAL LOW (ref 135–145)

## 2022-02-05 LAB — AMMONIA: Ammonia: 23 umol/L (ref 9–35)

## 2022-02-05 LAB — VITAMIN B12: Vitamin B-12: 837 pg/mL (ref 180–914)

## 2022-02-05 LAB — TSH: TSH: 0.399 u[IU]/mL (ref 0.350–4.500)

## 2022-02-05 LAB — GLUCOSE, CAPILLARY: Glucose-Capillary: 123 mg/dL — ABNORMAL HIGH (ref 70–99)

## 2022-02-05 MED ORDER — ATORVASTATIN CALCIUM 40 MG PO TABS
40.0000 mg | ORAL_TABLET | Freq: Every day | ORAL | Status: DC
  Administered 2022-02-06: 40 mg via ORAL
  Filled 2022-02-05: qty 1

## 2022-02-05 MED ORDER — IPRATROPIUM-ALBUTEROL 0.5-2.5 (3) MG/3ML IN SOLN: 3.0000 mL | Freq: Four times a day (QID) | RESPIRATORY_TRACT | Status: DC | PRN

## 2022-02-05 MED ORDER — BISOPROLOL FUMARATE 5 MG PO TABS
5.0000 mg | ORAL_TABLET | Freq: Every day | ORAL | Status: DC
  Administered 2022-02-05 – 2022-02-06 (×2): 5 mg via ORAL
  Filled 2022-02-05 (×2): qty 1

## 2022-02-05 MED ORDER — FUROSEMIDE 20 MG PO TABS
20.0000 mg | ORAL_TABLET | Freq: Every day | ORAL | Status: DC
  Administered 2022-02-06: 20 mg via ORAL
  Filled 2022-02-05: qty 1

## 2022-02-05 MED ORDER — UMECLIDINIUM-VILANTEROL 62.5-25 MCG/ACT IN AEPB
1.0000 | INHALATION_SPRAY | Freq: Every day | RESPIRATORY_TRACT | Status: DC
  Filled 2022-02-05 (×2): qty 14

## 2022-02-05 MED ORDER — UMECLIDINIUM-VILANTEROL 62.5-25 MCG/ACT IN AEPB
1.0000 | INHALATION_SPRAY | Freq: Every day | RESPIRATORY_TRACT | Status: DC
  Filled 2022-02-05: qty 14

## 2022-02-05 NOTE — Progress Notes (Signed)
PROGRESS NOTE        PATIENT DETAILS Name: Tammie Martin Age: 71 y.o. Sex: female Date of Birth: 17-Aug-1951 Admit Date: 02/04/2022 Admitting Physician Albertine Patricia, MD LSL:[id number], Koleen Distance, DO  Brief Summary: Patient is a 71 y.o.  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  See below for further details.   Significant events: 6/6-6/7>> hospitalization for TAVR-Eliquis resumed on 6/7. 6/9>> presented to [facility] ED with confusion/headache-embolic appearing CVA on MRI brain.  Admit to [facility] and subsequently transferred to [facility].    Significant studies: 6/09>> CT angio head/neck: No LVO, moderate to severe left vertebral artery stenosis at origin, bilateral carotid artery atherosclerosis without greater than 50% stenosis. 6/09>> MRI brain: Acute infarct involving left frontal cortex/left posterior temporal cortex, bilateral cerebellum. 6/09>> A1c: 5.6 6/10>> EEG: Pending 6/10>> TSH: Normal limits 6/10>> ammonia: Not elevated 6/10>> vitamin B12: 837   Significant microbiology data:   Procedures: 6/06>> TAVR  Consults: Neurology Cardiology  Subjective: Poor historian-but seems to be relatively awake and alert this morning-slow to answer questions.  Objective: Vitals: Blood pressure (!) 170/89, pulse 96, temperature 97.6 F (36.4 C), temperature source Oral, resp. rate 20, height '5\' 2"'$  (1.575 m), weight 52.1 kg, SpO2 92 %.   Exam: Gen Exam:Alert awake-not in any distress HEENT:atraumatic, normocephalic Chest: B/L clear to auscultation anteriorly.  Right chest wall-TAVR site appears without infection or discharge. CVS:S1S2 regular Abdomen:soft non tender, non distended Extremities:no edema Neurology: Poor effort but appears nonfocal. Skin: no rash  Pertinent Labs/Radiology:    Latest Ref Rng & Units 02/04/2022   12:50  PM 02/02/2022    2:04 AM 02/01/2022    2:43 PM  CBC  WBC 4.0 - 10.5 K/uL 11.6  10.3    Hemoglobin 12.0 - 15.0 g/dL 13.5  12.6  14.3   Hematocrit 36.0 - 46.0 % 39.5  37.5  42.0   Platelets 150 - 400 K/uL 152  194      Lab Results  Component Value Date   NA 133 (L) 02/04/2022   K 3.3 (L) 02/04/2022   CL 97 (L) 02/04/2022   CO2 31 02/04/2022      Assessment/Plan: Acute CVA: Appears embolic based on MRI-unclear whether this is from A-fib (anticoagulation held for TAVR but restarted on 6/7) or complication from TAVR procedure itself.  Check echo to assess prosthetic aortic valve-neurology following-have asked cardiology to evaluate as well-as may need further work-up including TEE.  Acute metabolic encephalopathy: Unclear whether this is from CVA-or other etiologies.  Check UA-await EEG.  Seems relatively awake and alert this morning.  Follow clinical course.  History of severe aortic stenosis-s/p TAVR 6/6: See above  Persistent atrial fibrillation: Eliquis held for TAVR-but resumed since 6/7-continue telemetry monitoring.  Chronic HFpEF: Euvolemic on exam.  Hyponatremia: Mild-Doubt any clinical significance-follow periodically.  Hypokalemia: Recheck  Transaminitis: Mild-unclear significance-recheck/trend for now.  HTN: Resume low-dose bisoprolol-allow some amount of permissive hypertension.  Hypothyroidism: Continue Synthroid-TSH on 6/10 within normal limit.  COPD: Appears stable-continue bronchodilators  History of macular degeneration/retinal detachment: Per daughter-patient apparently has poor vision at baseline-she apparently has a history of right retinal detachment.  Danelle Earthly thinks she had significant deterioration of vision-Per patient avoid doctor this morning-she seems to think that her vision has improved and  is back to her baseline.  Tobacco abuse: Will need ongoing counseling-on transdermal nicotine.  BMI: Estimated body mass index is 21.01 kg/m as  calculated from the following:   Height as of this encounter: '5\' 2"'$  (1.575 m).   Weight as of this encounter: 52.1 kg.   Code status:   Code Status: Full Code   DVT Prophylaxis: apixaban (ELIQUIS) tablet 5 mg     Family Communication: Daughter-Jennifer-[phone]-updated over the phone on 6/10   Disposition Plan: Status is: Inpatient Remains inpatient appropriate because: Acute embolic CVA/encephalopathy-work-up in progress-not yet stable for discharge-see above.   Planned Discharge Destination:Home health   Diet: Diet Order             Diet Heart Room service appropriate? Yes; Fluid consistency: Thin  Diet effective now                     Antimicrobial agents: Anti-infectives (From admission, onward)    None        MEDICATIONS: Scheduled Meds:   stroke: early stages of recovery book   Does not apply Once   apixaban  5 mg Oral BID   atorvastatin  20 mg Oral Daily   cholecalciferol  2,000 Units Oral Daily   levothyroxine  88 mcg Oral QAC breakfast   Continuous Infusions:  sodium chloride 50 mL/hr at 02/05/22 0447   PRN Meds:.acetaminophen **OR** acetaminophen (TYLENOL) oral liquid 160 mg/5 mL **OR** acetaminophen   I have personally reviewed following labs and imaging studies  LABORATORY DATA: CBC: Recent Labs  Lab 02/01/22 1249 02/01/22 1329 02/01/22 1443 02/02/22 0204 02/04/22 1250  WBC  --   --   --  10.3 11.6*  HGB 11.9* 12.2 14.3 12.6 13.5  HCT 35.0* 36.0 42.0 37.5 39.5  MCV  --   --   --  97.4 96.6  PLT  --   --   --  194 388    Basic Metabolic Panel: Recent Labs  Lab 02/01/22 1249 02/01/22 1329 02/01/22 1443 02/02/22 0204 02/04/22 1250  NA 137 138 138 136 133*  K 3.7 3.8 3.9 4.1 3.3*  CL 102 101 103 102 97*  CO2  --   --   --  26 31  GLUCOSE 104* 108* 115* 163* 109*  BUN '16 16 16 19 11  '$ CREATININE 0.40* 0.30* 0.40* 0.57 0.51  CALCIUM  --   --   --  8.6* 8.5*  MG  --   --   --  1.9  --     GFR: Estimated  Creatinine Clearance: 49.5 mL/min (by C-G formula based on SCr of 0.51 mg/dL).  Liver Function Tests: Recent Labs  Lab 02/04/22 1250  AST 49*  ALT 74*  ALKPHOS 145*  BILITOT 1.1  PROT 7.0  ALBUMIN 3.3*   No results for input(s): "LIPASE", "AMYLASE" in the last 168 hours. Recent Labs  Lab 02/05/22 0049  AMMONIA 23    Coagulation Profile: Recent Labs  Lab 02/04/22 1302  INR 1.1    Cardiac Enzymes: No results for input(s): "CKTOTAL", "CKMB", "CKMBINDEX", "TROPONINI" in the last 168 hours.  BNP (last 3 results) No results for input(s): "PROBNP" in the last 8760 hours.  Lipid Profile: No results for input(s): "CHOL", "HDL", "LDLCALC", "TRIG", "CHOLHDL", "LDLDIRECT" in the last 72 hours.  Thyroid Function Tests: Recent Labs    02/05/22 0049  TSH 0.399    Anemia Panel: Recent Labs    02/05/22 0049  VITAMINB12 837  Urine analysis:    Component Value Date/Time   COLORURINE AMBER (A) 01/28/2022 1336   APPEARANCEUR HAZY (A) 01/28/2022 1336   LABSPEC 1.018 01/28/2022 1336   PHURINE 6.0 01/28/2022 1336   GLUCOSEU NEGATIVE 01/28/2022 1336   HGBUR NEGATIVE 01/28/2022 1336   BILIRUBINUR NEGATIVE 01/28/2022 1336   KETONESUR NEGATIVE 01/28/2022 1336   PROTEINUR 30 (A) 01/28/2022 1336   NITRITE NEGATIVE 01/28/2022 1336   LEUKOCYTESUR TRACE (A) 01/28/2022 1336    Sepsis Labs: Lactic Acid, Venous No results found for: "LATICACIDVEN"  MICROBIOLOGY: Recent Results (from the past 240 hour(s))  Surgical pcr screen     Status: None   Collection Time: 01/28/22 10:23 AM   Specimen: Nasal Mucosa; Nasal Swab  Result Value Ref Range Status   MRSA, PCR NEGATIVE NEGATIVE Final   Staphylococcus aureus NEGATIVE NEGATIVE Final    Comment: (NOTE) The Xpert SA Assay (FDA approved for NASAL specimens in patients 74 years of age and older), is one component of a comprehensive surveillance program. It is not intended to diagnose infection nor to guide or monitor  treatment. Performed at [facility], [place] [address], [place], [place] [address]   SARS CORONAVIRUS 2 (TAT 6-24 HRS) Anterior Nasal Swab     Status: None   Collection Time: 01/28/22 11:08 AM   Specimen: Anterior Nasal Swab  Result Value Ref Range Status   SARS Coronavirus 2 NEGATIVE NEGATIVE Final    Comment: (NOTE) SARS-CoV-2 target nucleic acids are NOT DETECTED.  The SARS-CoV-2 RNA is generally detectable in upper and lower respiratory specimens during the acute phase of infection. Negative results do not preclude SARS-CoV-2 infection, do not rule out co-infections with other pathogens, and should not be used as the sole basis for treatment or other patient management decisions. Negative results must be combined with clinical observations, patient history, and epidemiological information. The expected result is Negative.  Fact Sheet for Patients: SugarRoll.be  Fact Sheet for Healthcare Providers: https://www.woods-mathews.com/  This test is not yet approved or cleared by the [place] FDA and  has been authorized for detection and/or diagnosis of SARS-CoV-2 by FDA under an Emergency Use Authorization (EUA). This EUA will remain  in effect (meaning this test can be used) for the duration of the COVID-19 declaration under Se ction 564(b)(1) of the Act, 21 U.S.C. section 360bbb-3(b)(1), unless the authorization is terminated or revoked sooner.  Performed at [facility], [place] [address] [place], [place] [address]     RADIOLOGY STUDIES/RESULTS: MR BRAIN WO CONTRAST  Result Date: 02/04/2022 CLINICAL DATA:  Neuro deficit, acute, stroke suspected; VISION DEFICIT EXAM: MRI HEAD WITHOUT CONTRAST TECHNIQUE: Multiplanar, multiecho pulse sequences of the brain and surrounding structures were obtained without intravenous contrast. COMPARISON:  None Available. FINDINGS: Brain: Few small scattered foci of diffusion hyperintensity  including left frontal cortex, left posterior temporal cortex, and bilateral cerebellum. No evidence of intracranial hemorrhage. Ventricles and sulci are within normal limits in size and configuration. Patchy and confluent areas of T2 hyperintensity in the supratentorial and pontine white matter are nonspecific but may reflect mild to moderate chronic microvascular ischemic changes. Vascular: Major vessel flow voids at the skull base are preserved. Skull and upper cervical spine: Normal marrow signal is preserved. Sinuses/Orbits: Paranasal sinuses are aerated. Orbits are unremarkable. Other: Sella is unremarkable.  Mastoid air cells are clear. IMPRESSION: Few punctate acute to subacute infarcts involving different vascular territories. No hemorrhage. Chronic microvascular ischemic changes. Electronically Signed   By: Macy Mis  M.D.   On: 02/04/2022 16:21   CT ANGIO HEAD NECK W WO CM  Result Date: 02/04/2022 CLINICAL DATA:  Neuro deficit, acute, stroke suspected EXAM: CT ANGIOGRAPHY HEAD AND NECK TECHNIQUE: Multidetector CT imaging of the head and neck was performed using the standard protocol during bolus administration of intravenous contrast. Multiplanar CT image reconstructions and MIPs were obtained to evaluate the vascular anatomy. Carotid stenosis measurements (when applicable) are obtained utilizing NASCET criteria, using the distal internal carotid diameter as the denominator. RADIATION DOSE REDUCTION: This exam was performed according to the departmental dose-optimization program which includes automated exposure control, adjustment of the mA and/or kV according to patient size and/or use of iterative reconstruction technique. CONTRAST:  128m OMNIPAQUE IOHEXOL 350 MG/ML SOLN COMPARISON:  None Available. FINDINGS: CT HEAD FINDINGS Brain: No evidence of acute large vascular territory infarction, hemorrhage, hydrocephalus, extra-axial collection or mass lesion/mass effect. Patchy white matter  hypodensities, nonspecific but compatible with chronic microvascular ischemic disease. Vascular: Detailed below. Skull: No acute fracture. Sinuses: Visualized sinuses are clear. Orbits: No acute finding. Review of the MIP images confirms the above findings CTA NECK FINDINGS Aortic arch: Aortic atherosclerosis. Great vessel origins are patent with mild-to-moderate atherosclerotic narrowing. Apparent linear filling defect within the brachiocephalic artery is favored artifactual given artifact this region. Right carotid system: Atherosclerosis at the carotid bifurcation without greater than 50% stenosis. Left carotid system: Atherosclerosis involving the common carotid artery and at the carotid bifurcation without greater than 50% stenosis. Vertebral arteries: Moderate to severe left vertebral artery origin stenosis. Otherwise, vertebral arteries are patent without significant stenosis. Skeleton: Severe multilevel degenerative disc disease. Multilevel facet uncovertebral hypertrophy with varying degrees of neural foraminal stenosis. Other neck: No acute findings. Upper chest: Emphysema. Review of the MIP images confirms the above findings CTA HEAD FINDINGS Anterior circulation: Bilateral intracranial ICAs, MCAs, and ACAs are patent without proximal hemodynamically significant stenosis Posterior circulation: Bilateral intradural vertebral arteries, basilar artery and bilateral posterior cerebral arteries are patent without proximal hemodynamically significant stenosis. No aneurysm identified. Venous sinuses: As permitted by contrast timing, patent. Review of the MIP images confirms the above findings IMPRESSION: 1. No emergent large vessel occlusion. 2. 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 3. Bilateral carotid bifurcation atherosclerosis without greater than 50% stenosis. 4. Aortic Atherosclerosis (ICD10-I70.0) and Emphysema (ICD10-J43.9). Electronically Signed   By: FMargaretha SheffieldM.D.   On:  02/04/2022 14:31     LOS: 1 day   SOren Binet MD  [facility]    To contact the attending provider between 7A-7P or the covering provider during after hours 7P-7A, please log into the web site [email or web address] and access using universal [place] password for that web site. If you do not have the password, please call the hospital operator.  02/05/2022, 9:51 AM

## 2022-02-05 NOTE — Progress Notes (Addendum)
STROKE TEAM PROGRESS NOTE   INTERVAL HISTORY Patient is seen in her room with no family at the bedside.  Yesterday, she presented with confusion, headaches and visual disturbances on awakening.  Of note, she is on Eliquis for atrial fibrillation, but this was held for her TAVR procedure and resumed on 6/7.  MRI reveals a few punctate infarcts in different vascular territories, likely embolic.  On exam, she is oriented to self only, unable to name most objects and perseverates on different words.  CT angiogram shows no large vessel stenosis or occlusion.  There is left vertebral artery origin stenosis.  Echocardiogram on 02/02/2022 had shown a mild paravalvular leak around the aortic prosthetic valve.  Vitals:   02/05/22 0400 02/05/22 0406 02/05/22 0928 02/05/22 1213  BP:   (!) 170/89 (!) 151/85  Pulse: 82  96 94  Resp: '20 19 20 20  '$ Temp:  97.9 F (36.6 C) 97.6 F (36.4 C) 97.6 F (36.4 C)  TempSrc:  Oral Oral Oral  SpO2: 93%  92% 94%  Weight:      Height:       CBC:  Recent Labs  Lab 02/02/22 0204 02/04/22 1250  WBC 10.3 11.6*  HGB 12.6 13.5  HCT 37.5 39.5  MCV 97.4 96.6  PLT 194 419   Basic Metabolic Panel:  Recent Labs  Lab 02/02/22 0204 02/04/22 1250 02/05/22 0953  NA 136 133* 136  K 4.1 3.3* 4.0  CL 102 97* 99  CO2 '26 31 25  '$ GLUCOSE 163* 109* 112*  BUN '19 11 8  '$ CREATININE 0.57 0.51 0.57  CALCIUM 8.6* 8.5* 9.2  MG 1.9  --   --    Lipid Panel:  Recent Labs  Lab 02/05/22 0941  CHOL 133  TRIG 89  HDL 36*  CHOLHDL 3.7  VLDL 18  LDLCALC 79   HgbA1c:  Recent Labs  Lab 02/04/22 1250  HGBA1C 5.6   Urine Drug Screen: No results for input(s): "LABOPIA", "COCAINSCRNUR", "LABBENZ", "AMPHETMU", "THCU", "LABBARB" in the last 168 hours.  Alcohol Level No results for input(s): "ETH" in the last 168 hours.  IMAGING past 24 hours MR BRAIN WO CONTRAST  Result Date: 02/04/2022 CLINICAL DATA:  Neuro deficit, acute, stroke suspected; VISION DEFICIT EXAM: MRI HEAD  WITHOUT CONTRAST TECHNIQUE: Multiplanar, multiecho pulse sequences of the brain and surrounding structures were obtained without intravenous contrast. COMPARISON:  None Available. FINDINGS: Brain: Few small scattered foci of diffusion hyperintensity including left frontal cortex, left posterior temporal cortex, and bilateral cerebellum. No evidence of intracranial hemorrhage. Ventricles and sulci are within normal limits in size and configuration. Patchy and confluent areas of T2 hyperintensity in the supratentorial and pontine white matter are nonspecific but may reflect mild to moderate chronic microvascular ischemic changes. Vascular: Major vessel flow voids at the skull base are preserved. Skull and upper cervical spine: Normal marrow signal is preserved. Sinuses/Orbits: Paranasal sinuses are aerated. Orbits are unremarkable. Other: Sella is unremarkable.  Mastoid air cells are clear. IMPRESSION: Few punctate acute to subacute infarcts involving different vascular territories. No hemorrhage. Chronic microvascular ischemic changes. Electronically Signed   By: Tammie Martin M.D.   On: 02/04/2022 16:21    PHYSICAL EXAM General:  Well-nourished, well-developed elderly patient in no acute distress Respiratory:  Regular, unlabored respirations on room air  NEURO:  Mental Status: AA&Ox1 patient is awake but has poor insight into illness and registration.  Speech/Language: speech is without dysarthria or aphasia but with perseveration on different words.  Unable to  name most objects  Cranial Nerves:  II: PERRL.  III, IV, VI: EOMI. Eyelids elevate symmetrically.  V: Sensation is intact to light touch and symmetrical to face.  VII: Smile is symmetrical.  VIII: hearing intact to voice. IX, X: Phonation is normal.  XII: tongue is midline without fasciculations. Motor: 5/5 strength to all muscle groups tested.  Tone: is normal and bulk is normal Sensation- Intact to light touch bilaterally.   Coordination: FTN intact bilaterally.No drift.  Gait- deferred    ASSESSMENT/PLAN Tammie Martin is a 71 y.o. female with history of HTN, HLD, tobacco abuse, atrial fibrillation on Eliquis, AS s/p TAVR on 6/5 presenting with  confusion, headaches and visual disturbances on awakening.  Of note, she is on Eliquis for atrial fibrillation, but this was held for her TAVR procedure and resumed on 6/7.  MRI reveals a few punctate infarcts in different vascular territories, likely embolic.  On exam, she is oriented to self only, unable to name most objects and perseverates on different words.  Stroke:  bilateral punctate infarcts likely secondary due to embolism in setting of atrial fibrillation with anticoagulation held for procedure CT head No acute abnormality. Small vessel disease. Atrophy.  CTA head & neck No emergent LVO, left vertebral artery stenosis, bilateral carotid bifurcation atherosclerosis with less than 50% stenosis MRI  few punctate infarcts in different vascular territories, chronic microvascular ischemic changes 2D Echo pending LDL 79 HgbA1c 5.6 VTE prophylaxis - fully anticoagulated with Eliquis    Diet   Diet Heart Room service appropriate? Yes; Fluid consistency: Thin   Eliquis (apixaban) daily prior to admission, now on Eliquis (apixaban) daily.  Therapy recommendations:  no PT follow up Disposition:  pending  Atrial fibrillation Patient has history of atrial fibrillation Home Eliquis was held for recent TAVR procedure Continue Eliquis  Hypertension Home meds:  bisoprolol 15 mg daily Stable Permissive hypertension (OK if < 220/120) but gradually normalize in 5-7 days Long-term BP goal normotensive  Hyperlipidemia Home meds:  atorvastatin 20 mg daily, increased to 40 mg LDL 79, goal < 70 Continue statin at discharge   Other Stroke Risk Factors Advanced Age >/= 57  Former Cigarette smoker  Other Active Problems none  Hospital day # Tammie Martin , MSN, AGACNP-BC [facility] See Amion for schedule and pager information 02/05/2022 3:09 PM    STROKE MD NOTE :  I have personally obtained history,examined this patient, reviewed notes, independently viewed imaging studies, participated in medical decision making and plan of care.ROS completed by me personally and pertinent positives fully documented  I have made any additions or clarifications directly to the above note. Agree with note above.  Patient presented with altered mental status and MRI shows multiple bilateral small embolic infarcts likely due to A-fib while anticoagulation was held for TAVR procedure and recently restarted 2 days ago.  Continue Eliquis.  Check lipid profile.  Continue ongoing stroke work-up.  Discussed with Dr.Ghimire.  Greater than 50% time during this 50-minute visit was spent in counseling and coordination of care about embolic strokes and discussion with patient and care team and answering questions.  Tammie Contras, MD Medical Director [facility] Pager: [phone] 02/05/2022 3:48 PM   To contact Stroke Continuity provider, please refer to [email or web address] After hours, contact General Neurology

## 2022-02-05 NOTE — Procedures (Signed)
Routine EEG Report  Tammie Martin is a 71 y.o. female with a history of encephalopathy who is undergoing an EEG to evaluate for seizures.  Report: This EEG was acquired with electrodes placed according to the International 10-20 electrode system (including Fp1, Fp2, F3, F4, C3, C4, P3, P4, O1, O2, T3, T4, T5, T6, A1, A2, Fz, Cz, Pz). The following electrodes were missing or displaced: none.  The occipital dominant rhythm was primarily approximately 7 Hz with brief periods of normal alpha activity at 8.5 Hz. This activity is reactive to stimulation. Drowsiness was manifested by background fragmentation; deeper stages of sleep were identified by K complexes and sleep spindles. There was no focal slowing. There were no interictal epileptiform discharges. There were no electrographic seizures identified. Photic stimulation and hyperventilation were not performed.  Impression and clinical correlation: This EEG was obtained while awake and asleep and is abnormal due to mild diffuse slowing indicative of global cerebral dysfunction. Epileptiform abnormalities were not seen during this recording.  Su Monks, MD [facility] [phone]  If 7pm- 7am, please page neurology on call as listed in [place].

## 2022-02-05 NOTE — Progress Notes (Signed)
EEG complete - results pending 

## 2022-02-05 NOTE — Evaluation (Signed)
Physical Therapy Evaluation Patient Details Name: Tammie Martin MRN: [id number] DOB: Oct 28, 1950 Today's Date: 02/05/2022  History of Present Illness  71 y.o. female presents to ED 02/04/22 secondary to complaints of headache, confusion, and decr vision. 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  Clinical Impression   Pt admitted secondary to problem above with deficits below. PTA patient was apparently living alone (pt provided some history with expressive and/or cognitive issues at this time). Assumed pt was independent with mobility, if living alone. Pt currently requires minguard assist for mobility due to decr awareness of lines/monitors and mild imbalance. Patient would not walk in hallway, therefore further balance/gait assessments are warranted as pt can more consistently follow simple commands.  Anticipate patient will benefit from PT to address problems listed below.Will continue to follow acutely to maximize functional mobility independence and safety.          Recommendations for follow up therapy are one component of a multi-disciplinary discharge planning process, led by the attending physician.  Recommendations may be updated based on patient status, additional functional criteria and insurance authorization.  Follow Up Recommendations No PT follow up    Assistance Recommended at Discharge Frequent or constant Supervision/Assistance (due to decr cognition and language)  Patient can return home with the following  A little help with walking and/or transfers;Assistance  with cooking/housework;Direct supervision/assist for medications management;Direct supervision/assist for financial management;Assist for transportation;Help with stairs or ramp for entrance    Equipment Recommendations None recommended by PT  Recommendations for Other Services  OT consult;Speech consult    Functional Status Assessment Patient has had a recent decline in their functional status and demonstrates the ability to make significant improvements in function in a reasonable and predictable amount of time.     Precautions / Restrictions Precautions Precautions: Fall;Other (comment) Precaution Comments: blind in rt eye; decr vision left      Mobility  Bed Mobility Overal bed mobility: Independent                  Transfers Overall transfer level: Needs assistance Equipment used: None Transfers: Sit to/from Stand Sit to Stand: Supervision           General transfer comment: for safety with lines, IV    Ambulation/Gait Ambulation/Gait assistance: Min guard Gait Distance (Feet): 25 Feet (20, 15 seated breaks between due to elevated HR) Assistive device: None Gait Pattern/deviations: WFL(Within Functional Limits) Gait velocity: too fast for IV, lines Gait velocity interpretation: >2.62 ft/sec, indicative of community ambulatory   General Gait Details: did not want to go in hall, therefore difficult to assess if she is drifting (not apparent in room); no imbalance with multiple short walks in her room  Stairs            Wheelchair Mobility    Modified Rankin (Stroke Patients Only) Modified Rankin (Stroke Patients Only) Pre-Morbid Rankin Score: No symptoms Modified Rankin: Moderately severe disability     Balance                                 Standardized Balance Assessment Standardized Balance Assessment : Merrilee Jansky  Balance Test Berg Balance Test Sit to Stand: Able to stand without using hands and stabilize independently Standing  Unsupported: Able to stand safely 2 minutes Sitting with Back Unsupported but Feet Supported on Floor or Stool: Able to sit safely and securely 2 minutes Stand to Sit: Sits safely with minimal use of hands Transfers: Able to transfer safely, minor use of hands Standing Unsupported with Eyes Closed: Able to stand 10 seconds safely Standing Ubsupported with Feet Together: Able to place feet together independently but unable to hold for 30 seconds From Standing, Reach Forward with Outstretched Arm:  (pt could not understand task) From Standing Position, Pick up Object from Floor: Able to pick up shoe, needs supervision From Standing Position, Turn to Look Behind Over each Shoulder: Turn sideways only but maintains balance         Pertinent Vitals/Pain Pain Assessment Pain Assessment: Faces Faces Pain Scale: No hurt    Home Living Family/patient expects to be discharged to:: Private residence Living Arrangements: Alone                 Additional Comments: pt confused and unable to provide reliable information    Prior Function Prior Level of Function : Independent/Modified Independent             Mobility Comments: assumed       Hand Dominance        Extremity/Trunk Assessment   Upper Extremity Assessment Upper Extremity Assessment: Defer to OT evaluation    Lower Extremity Assessment Lower Extremity Assessment: Overall WFL for tasks assessed    Cervical / Trunk Assessment Cervical / Trunk Assessment: Normal  Communication   Communication: Expressive difficulties  Cognition Arousal/Alertness: Awake/alert Behavior During Therapy: WFL for tasks assessed/performed Overall Cognitive Status: Impaired/Different from baseline Area of Impairment: Orientation, Following commands, Awareness, Safety/judgement, Problem solving                 Orientation Level: Place, Time Tammie Martin but not [name], couldn't state town; +knew she'd had a stroke)     Following  Commands: Follows one step commands inconsistently Safety/Judgement: Decreased awareness of safety, Decreased awareness of deficits Awareness: Intellectual Problem Solving: Slow processing, Difficulty sequencing, Requires verbal cues, Requires tactile cues General Comments: pt with significant difficulty understanding "reach forward and touch my hand" to further assess balance--pt initially not initiating movement and then began reaching down to touch her toes; moves too fast with poor awareness of IV and monitor lines; could not see or initially find the bathroom because it was on her right, with cues able to turn head and find it        General Comments General comments (skin integrity, edema, etc.): Patient up to bathroom at end of session. NT in to give pt bath and left in NT's care (on monitor)    Exercises     Assessment/Plan    PT Assessment Patient needs continued PT services  PT Problem List Decreased activity tolerance;Decreased mobility;Decreased safety awareness;Cardiopulmonary status limiting activity       PT Treatment Interventions Gait training;Functional mobility training;Therapeutic activities;Balance training;Neuromuscular re-education;Cognitive remediation;Patient/family education    PT Goals (Current goals can be found in the Care Plan section)  Acute Rehab PT Goals Patient Stated Goal: unable to state PT Goal Formulation: Patient unable to participate in goal setting Time For Goal Achievement: 02/19/22 Potential to Achieve Goals: Good    Frequency Min 3X/week     Co-evaluation  AM-PAC PT "6 Clicks" Mobility  Outcome Measure Help needed turning from your back to your side while in a flat bed without using bedrails?: None Help needed moving from lying on your back to sitting on the side of a flat bed without using bedrails?: None Help needed moving to and from a bed to a chair (including a wheelchair)?: A Little Help needed standing up  from a chair using your arms (e.g., wheelchair or bedside chair)?: A Little Help needed to walk in Martin room?: A Little Help needed climbing 3-5 steps with a railing? : A Little 6 Click Score: 20    End of Session Equipment Utilized During Treatment: Gait belt Activity Tolerance: Patient tolerated treatment well Patient left: Other (comment);with nursing/sitter in room (in bathroom) Nurse Communication: Mobility status;Other (comment) (decr cognition and need for chair alarm (already in chair)) PT Visit Diagnosis: Unsteadiness on feet (R26.81)    Time: 1140-1157 PT Time Calculation (min) (ACUTE ONLY): 17 min   Charges:   PT Evaluation $PT Eval Low Complexity: [place], PT Acute Rehabilitation Services  Office [phone]   Rexanne Mano 02/05/2022, 12:24 PM

## 2022-02-05 NOTE — Plan of Care (Signed)

## 2022-02-05 NOTE — Consult Note (Signed)
Cardiology Consultation:   Patient ID: ERI MCEVERS MRN: [id number]; DOB: 1950-12-29  Admit date: 02/04/2022 Date of Consult: 02/05/2022  PCP:  Janora Norlander, DO   [facility] Prov[facility] Cardiologist:  Carlyle Dolly, MD   {    Patient Profile:   ERNESTINE ROHMAN is a 71 y.o. female with a hx of persistent atrial fibrillation, aortic stenosis status post recent TAVR, COPD, history of mild to moderate coronary artery disease, hypertension, hyperlipidemia who is being seen 02/05/2022 for the evaluation of CVA/atrial fibrillation at the request of Oren Binet MD.  History of Present Illness:    Pt recently diagnosed with atrial fibrillation.  She underwent elective cardioversion February 2023 but atrial fibrillation recurred.  She was also found to have severe aortic stenosis.  Preoperative catheterization March 2023 showed 40% LAD, 40% circumflex, 40 to 50% right coronary artery and severe aortic stenosis.  She underwent TAVR on June 6 with Edwards SAPIEN 3 ultra Resilia THV size 23.  Follow-up echocardiogram February 02, 2022 function, mild right ventricular enlargement, severe pulmonary hypertension, severe biatrial enlargement, mild mitral stenosis, status post TAVR with mild perivalvular AI and mean gradient 5 mmHg.  Patient contacted the office with headache and was admitted yesterday with confusion, headache and visual disturbance.  MRI showed acute to subacute ischemic infarcts.  CTA showed less than 50% bilateral carotid stenosis and moderate severe left vertebral artery stenosis.  She is confused at the time of my evaluation.  She denies dyspnea, chest pain but has had a headache.  She is having difficulties with dysarthria but is not weak in her extremities and there is no loss of sensation.   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 CBurt Knackand Dr. BCyndia Bent  Severe aortic stenosis    Thyroid disease    Tobacco abuse     Past Surgical History:  Procedure Laterality Date   AORTIC VALVE REPLACEMENT     APPENDECTOMY     pt recently had CT that said Appendix was there so she is unsure if this was actually removed during hysterectomy as she was previously told   BREAST ENHANCEMENT SURGERY     CARDIOVERSION N/A 10/27/2021   Procedure: CARDIOVERSION;  Surgeon: BArnoldo Lenis MD;  Location: [facility];  Service: Endoscopy;  Laterality: N/A;   INTRAOPERATIVE TRANSESOPHAGEAL ECHOCARDIOGRAM N/A 02/01/2022   Procedure: INTRAOPERATIVE TRANSESOPHAGEAL ECHOCARDIOGRAM;  Surgeon: CSherren Mocha MD;  Location: [facility]  Service: Open Heart Surgery;  Laterality: N/A;   RIGHT/LEFT HEART CATH AND CORONARY ANGIOGRAPHY N/A 11/22/2021   Procedure: RIGHT/LEFT HEART CATH AND CORONARY ANGIOGRAPHY;  Surgeon: CSherren Mocha MD;  Location: [facility];  Service: Cardiovascular;  Laterality: N/A;   TONSILLECTOMY     VAGINAL HYSTERECTOMY       Inpatient Medications: Scheduled Meds:   stroke: early stages of recovery book   Does not apply Once   apixaban  5 mg Oral BID   atorvastatin  20 mg Oral Daily   bisoprolol  5 mg Oral Daily  cholecalciferol  2,000 Units Oral Daily   [START ON 02/06/2022] furosemide  20 mg Oral Daily   levothyroxine  88 mcg Oral QAC breakfast   umeclidinium-vilanterol  1 puff Inhalation Q0600   Continuous Infusions:   PRN Meds: acetaminophen **OR** acetaminophen (TYLENOL) oral liquid 160 mg/5 mL **OR** acetaminophen, ipratropium-albuterol  Allergies:    Allergies  Allergen Reactions   Penicillins Hives   Singulair [Montelukast  Sodium]     headache   Codeine Rash   Nicoderm [Nicotine] Hives and Swelling   Other Rash    Band-aids cause rash after a few days    Social History:   Social History   Socioeconomic History   Marital status: Widowed    Spouse name: Not on file   Number of children: 2   Years of education: 14   Highest education level: Associate degree: academic program  Occupational History   Occupation: Retired    Comment: Equities trader  Tobacco Use   Smoking status: Former    Packs/day: 1.00    Years: 40.00    Total pack years: 40.00    Types: Cigarettes   Smokeless tobacco: Never  Vaping Use   Vaping Use: Never used  Substance and Sexual Activity   Alcohol use: Not Currently   Drug use: No   Sexual activity: Not Currently  Other Topics Concern   Not on file  Social History Narrative   Lives alone - one cat, one dog   Social Determinants of Health   Financial Resource Strain: Low Risk  (06/03/2021)   Overall Financial Resource Strain (CARDIA)    Difficulty of Paying Living Expenses: Not hard at all  Food Insecurity: No Food Insecurity (06/03/2021)   Hunger Vital Sign    Worried About Running Out of Food in the Last Year: Never true    [place] in the Last Year: Never true  Transportation Needs: No Transportation Needs (06/03/2021)   PRAPARE - Hydrologist (Medical): No    Lack of Transportation (Non-Medical): No  Physical Activity: Inactive (06/03/2021)   Exercise Vital Sign    Days of Exercise per Week: 0 days    Minutes of Exercise per Session: 0 min  Stress: No Stress Concern Present (06/03/2021)   [place]    Feeling of Stress : Not at all  Social Connections: Moderately Isolated (06/03/2021)   Social Connection and Isolation Panel [NHANES]    Frequency of Communication with Friends and Family: More than three times a week    Frequency of Social Gatherings with  Friends and Family: More than three times a week    Attends Religious Services: Never    Marine scientist or Organizations: Yes    Attends Music therapist: More than 4 times per year    Marital Status: Widowed  Intimate Partner Violence: Not At Risk (06/03/2021)   Humiliation, Afraid, Rape, and Kick questionnaire    Fear of Current or Ex-Partner: No    Emotionally Abused: No    Physically Abused: No    Sexually Abused: No    Family History:    Family History  Problem Relation Age of Onset   Cancer Mother        breast cancer at 44    Kidney disease Mother    Cancer Maternal Aunt        colon cancer   Heart attack Father    Diabetes  Sister    Obesity Sister    Depression Sister    Alcohol abuse Brother    Cirrhosis Brother    Depression Brother    Liver disease Brother    Diabetes Daughter    Fibromyalgia Daughter    Depression Daughter    Anxiety disorder Daughter    Arthritis Daughter    Depression Sister    Obesity Sister      ROS:  Please see the history of present illness.  Dysarthric but no fevers, chills or productive cough. All other ROS reviewed and negative.     Physical Exam/Data:   Vitals:   02/05/22 0300 02/05/22 0400 02/05/22 0406 02/05/22 0928  BP:    (!) 170/89  Pulse:  82  96  Resp: '18 20 19 20  '$ Temp:   97.9 F (36.6 C) 97.6 F (36.4 C)  TempSrc:   Oral Oral  SpO2: 93% 93%  92%  Weight:      Height:        Intake/Output Summary (Last 24 hours) at 02/05/2022 1037 Last data filed at 02/05/2022 0447 Gross per 24 hour  Intake 591.86 ml  Output --  Net 591.86 ml      02/04/2022   10:38 PM 02/02/2022    4:12 AM 02/01/2022    8:48 AM  Last 3 Weights  Weight (lbs) 114 lb 13.8 oz 125 lb 10.6 oz 120 lb  Weight (kg) 52.1 kg 57 kg 54.432 kg     Body mass index is 21.01 kg/m.  General:  Well nourished, well developed, in no acute distress HEENT: normal Neck: no JVD Vascular: No carotid bruits; Distal pulses 2+  bilaterally Cardiac: Irregular, 2/6 systolic murmur left sternal border.  No diastolic murmur noted. Lungs:  clear to auscultation bilaterally, no wheezing, rhonchi or rales.  Right upper chest incision without evidence of infection. Abd: soft, nontender, no hepatomegaly, 2+ femoral pulses bilaterally.  Right groin shows no hematoma and no bruit. Ext: no edema Musculoskeletal:  No deformities, BUE and BLE strength normal and equal Skin: warm and dry  Neuro: Patient moves all extremities.  She is confused with some degree of dysarthria. Psych:  Normal affect   EKG:  The EKG was personally reviewed and demonstrates: Atrial fibrillation with inferolateral T wave inversion. Telemetry:  Telemetry was personally reviewed and demonstrates: Atrial fibrillation   Laboratory Data:  Chemistry Recent Labs  Lab 02/01/22 1443 02/02/22 0204 02/04/22 1250  NA 138 136 133*  K 3.9 4.1 3.3*  CL 103 102 97*  CO2  --  26 31  GLUCOSE 115* 163* 109*  BUN '16 19 11  '$ CREATININE 0.40* 0.57 0.51  CALCIUM  --  8.6* 8.5*  MG  --  1.9  --   GFRNONAA  --  >60 >60  ANIONGAP  --  8 5    Recent Labs  Lab 02/04/22 1250  PROT 7.0  ALBUMIN 3.3*  AST 49*  ALT 74*  ALKPHOS 145*  BILITOT 1.1   Lipids  Recent Labs  Lab 02/05/22 0941  CHOL 133  TRIG 89  HDL 36*  LDLCALC 79  CHOLHDL 3.7    Hematology Recent Labs  Lab 02/01/22 1443 02/02/22 0204 02/04/22 1250  WBC  --  10.3 11.6*  RBC  --  3.85* 4.09  HGB 14.3 12.6 13.5  HCT 42.0 37.5 39.5  MCV  --  97.4 96.6  MCH  --  32.7 33.0  MCHC  --  33.6 34.2  RDW  --  13.7 13.2  PLT  --  194 152   Thyroid  Recent Labs  Lab 02/05/22 0049  TSH 0.399     Radiology/Studies:  MR BRAIN WO CONTRAST  Result Date: 02/04/2022 CLINICAL DATA:  Neuro deficit, acute, stroke suspected; VISION DEFICIT EXAM: MRI HEAD WITHOUT CONTRAST TECHNIQUE: Multiplanar, multiecho pulse sequences of the brain and surrounding structures were obtained without intravenous  contrast. COMPARISON:  None Available. FINDINGS: Brain: Few small scattered foci of diffusion hyperintensity including left frontal cortex, left posterior temporal cortex, and bilateral cerebellum. No evidence of intracranial hemorrhage. Ventricles and sulci are within normal limits in size and configuration. Patchy and confluent areas of T2 hyperintensity in the supratentorial and pontine white matter are nonspecific but may reflect mild to moderate chronic microvascular ischemic changes. Vascular: Major vessel flow voids at the skull base are preserved. Skull and upper cervical spine: Normal marrow signal is preserved. Sinuses/Orbits: Paranasal sinuses are aerated. Orbits are unremarkable. Other: Sella is unremarkable.  Mastoid air cells are clear. IMPRESSION: Few punctate acute to subacute infarcts involving different vascular territories. No hemorrhage. Chronic microvascular ischemic changes. Electronically Signed   By: Macy Mis M.D.   On: 02/04/2022 16:21   CT ANGIO HEAD NECK W WO CM  Result Date: 02/04/2022 CLINICAL DATA:  Neuro deficit, acute, stroke suspected EXAM: CT ANGIOGRAPHY HEAD AND NECK TECHNIQUE: Multidetector CT imaging of the head and neck was performed using the standard protocol during bolus administration of intravenous contrast. Multiplanar CT image reconstructions and MIPs were obtained to evaluate the vascular anatomy. Carotid stenosis measurements (when applicable) are obtained utilizing NASCET criteria, using the distal internal carotid diameter as the denominator. RADIATION DOSE REDUCTION: This exam was performed according to the departmental dose-optimization program which includes automated exposure control, adjustment of the mA and/or kV according to patient size and/or use of iterative reconstruction technique. CONTRAST:  147m OMNIPAQUE IOHEXOL 350 MG/ML SOLN COMPARISON:  None Available. FINDINGS: CT HEAD FINDINGS Brain: No evidence of acute large vascular territory  infarction, hemorrhage, hydrocephalus, extra-axial collection or mass lesion/mass effect. Patchy white matter hypodensities, nonspecific but compatible with chronic microvascular ischemic disease. Vascular: Detailed below. Skull: No acute fracture. Sinuses: Visualized sinuses are clear. Orbits: No acute finding. Review of the MIP images confirms the above findings CTA NECK FINDINGS Aortic arch: Aortic atherosclerosis. Great vessel origins are patent with mild-to-moderate atherosclerotic narrowing. Apparent linear filling defect within the brachiocephalic artery is favored artifactual given artifact this region. Right carotid system: Atherosclerosis at the carotid bifurcation without greater than 50% stenosis. Left carotid system: Atherosclerosis involving the common carotid artery and at the carotid bifurcation without greater than 50% stenosis. Vertebral arteries: Moderate to severe left vertebral artery origin stenosis. Otherwise, vertebral arteries are patent without significant stenosis. Skeleton: Severe multilevel degenerative disc disease. Multilevel facet uncovertebral hypertrophy with varying degrees of neural foraminal stenosis. Other neck: No acute findings. Upper chest: Emphysema. Review of the MIP images confirms the above findings CTA HEAD FINDINGS Anterior circulation: Bilateral intracranial ICAs, MCAs, and ACAs are patent without proximal hemodynamically significant stenosis Posterior circulation: Bilateral intradural vertebral arteries, basilar artery and bilateral posterior cerebral arteries are patent without proximal hemodynamically significant stenosis. No aneurysm identified. Venous sinuses: As permitted by contrast timing, patent. Review of the MIP images confirms the above findings IMPRESSION: 1. No emergent large vessel occlusion. 2. 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 3. Bilateral carotid bifurcation atherosclerosis without greater than 50% stenosis. 4. Aortic  Atherosclerosis (ICD10-I70.0) and Emphysema (ICD10-J43.9). Electronically Signed  By: Margaretha Sheffield M.D.   On: 02/04/2022 14:31   ECHOCARDIOGRAM COMPLETE  Result Date: 02/02/2022    ECHOCARDIOGRAM REPORT   Patient Name:   DINIA JOYNT Date of Exam: 02/02/2022 Medical Rec #:  [id number]         Height:       62.5 in Accession #:    [id number]        Weight:       125.7 lb Date of Birth:  [birth year]         BSA:          1.578 m Patient Age:    88 years          BP:           137/87 mmHg Patient Gender: F                 HR:           95 bpm. 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 COPD, Aortic Valve Disease, Arrythmias:Atrial                 Fibrillation; Risk Factors:Hypertension, Dyslipidemia and                 Current Smoker. Thyroid disease.                 Aortic Valve: 23 mm Sapien prosthetic, stented (TAVR) valve is                 present in the aortic position. Procedure Date: 01/01/2022.  Sonographer:    Darlina Sicilian RDCS Referring Phys: [id number] KATHRYN R THOMPSON IMPRESSIONS  1. 23 mm S3. Mild paravalvular leak. Vmax 1.6 m/s, MG 5.0 mmHG, EOA 2.0 cm2, DI 0.55. Prosthesis within normal limits. The aortic valve has been repaired/replaced. Aortic valve regurgitation is mild. There is a 23 mm Sapien prosthetic (TAVR) valve present in the aortic position. Procedure Date: 01/01/2022.  2. Left ventricular ejection fraction, by estimation, is 60 to 65%. The left ventricle has normal function. The left ventricle has no regional wall motion abnormalities. Left ventricular diastolic function could not be evaluated.  3. Right ventricular systolic function is normal. The right ventricular size is mildly enlarged. There is severely elevated pulmonary artery systolic pressure.  4. Left atrial size was  severely dilated.  5. Right atrial size was severely dilated.  6. The mitral valve is degenerative. Trivial mitral valve regurgitation. Mild mitral stenosis. The mean mitral valve gradient is 4.0 mmHg with average heart rate of 80 bpm. Moderate to severe mitral annular calcification.  7. The inferior vena cava is dilated in size with <50% respiratory variability, suggesting right atrial pressure of 15 mmHg. FINDINGS  Left Ventricle: Left ventricular ejection fraction, by estimation, is 60 to 65%. The left ventricle has normal function. The left ventricle has no regional wall motion abnormalities. The left ventricular internal cavity size was normal in size. There is  no left ventricular hypertrophy. Left ventricular diastolic function could not be evaluated due to atrial fibrillation. Left ventricular diastolic function could not be evaluated. Right Ventricle: The right ventricular size is mildly enlarged. No increase in right ventricular wall thickness. Right ventricular  systolic function is normal. There is severely elevated pulmonary artery systolic pressure. The tricuspid regurgitant velocity is 3.62 m/s, and with an assumed right atrial pressure of 15 mmHg, the estimated right ventricular systolic pressure is 85.2 mmHg. Left Atrium: Left atrial size was severely dilated. Right Atrium: Right atrial size was severely dilated. Pericardium: There is no evidence of pericardial effusion. Mitral Valve: The mitral valve is degenerative in appearance. Moderate to severe mitral annular calcification. Trivial mitral valve regurgitation. Mild mitral valve stenosis. MV peak gradient, 7.7 mmHg. The mean mitral valve gradient is 4.0 mmHg with average heart rate of 80 bpm. Tricuspid Valve: The tricuspid valve is grossly normal. Tricuspid valve regurgitation is mild. Aortic Valve: 23 mm S3. Mild paravalvular leak. Vmax 1.6 m/s, MG 5.0 mmHG, EOA 2.0 cm2, DI 0.55. Prosthesis within normal limits. The aortic valve has been  repaired/replaced. Aortic valve regurgitation is mild. Aortic valve mean gradient measures 5.0 mmHg. Aortic valve peak gradient measures 10.1 mmHg. Aortic valve area, by VTI measures 2.00 cm. There is a 23 mm Sapien prosthetic, stented (TAVR) valve present in the aortic position. Procedure Date: 01/01/2022. Pulmonic Valve: The pulmonic valve was grossly normal. Pulmonic valve regurgitation is trivial. No evidence of pulmonic stenosis. Aorta: The aortic root and ascending aorta are structurally normal, with no evidence of dilitation. Venous: The inferior vena cava is dilated in size with less than 50% respiratory variability, suggesting right atrial pressure of 15 mmHg. IAS/Shunts: The atrial septum is grossly normal.  LEFT VENTRICLE PLAX 2D LVOT diam:     2.15 cm   Diastology LV SV:         61        LV e' medial:    3.94 cm/s LV SV Index:   39        LV E/e' medial:  32.0 LVOT Area:     3.63 cm  LV e' lateral:   11.20 cm/s                          LV E/e' lateral: 11.3  RIGHT VENTRICLE RV S prime:     9.38 cm/s TAPSE (M-mode): 2.0 cm LEFT ATRIUM              Index        RIGHT ATRIUM           Index LA Vol (A2C):   129.0 ml 81.73 ml/m  RA Area:     31.20 cm LA Vol (A4C):   128.0 ml 81.09 ml/m  RA Volume:   100.00 ml 63.35 ml/m LA Biplane Vol: 130.0 ml 82.36 ml/m  AORTIC VALVE AV Area (Vmax):    1.84 cm AV Area (Vmean):   1.93 cm AV Area (VTI):     2.00 cm AV Vmax:           159.00 cm/s AV Vmean:          106.500 cm/s AV VTI:            0.306 m AV Peak Grad:      10.1 mmHg AV Mean Grad:      5.0 mmHg LVOT Vmax:         80.40 cm/s LVOT Vmean:        56.650 cm/s LVOT VTI:          0.168 m LVOT/AV VTI ratio: 0.55  AORTA Ao Asc diam: 3.15 cm MITRAL VALVE  TRICUSPID VALVE MV Area (PHT): 2.90 cm     TR Peak grad:   52.4 mmHg MV Peak grad:  7.7 mmHg     TR Vmax:        362.00 cm/s MV Mean grad:  4.0 mmHg MV Vmax:       1.39 m/s     SHUNTS MV Vmean:      89.0 cm/s    Systemic VTI:  0.17 m MV Decel  Time: 262 msec     Systemic Diam: 2.15 cm MV E velocity: 126.00 cm/s Eleonore Chiquito MD Electronically signed by Eleonore Chiquito MD Signature Date/Time: 02/02/2022/11:47:25 AM    Final    ECHO TEE  Result Date: 02/01/2022    TRANSESOPHOGEAL ECHO REPORT   Patient Name:   MAKAELA CANDO Date of Exam: 02/01/2022 Medical Rec #:  [id number]         Height:       62.5 in Accession #:    [id number]        Weight:       120.0 lb Date of Birth:  [birth year]         BSA:          1.548 m Patient Age:    25 years          BP:           110/97 mmHg Patient Gender: F                 HR:           91 bpm. Exam Location:  Inpatient Procedure: Transesophageal Echo, Cardiac Doppler and Color Doppler Indications:     Aortic stenosis, severe  History:         Patient has prior history of Echocardiogram examinations. COPD,                  Aortic Valve Disease, Arrythmias:Atrial Fibrillation; Risk                  Factors:Hypertension, Dyslipidemia and Current Smoker. Thyroid                  disease.                  Aortic Valve: 23 mm Sapien prosthetic, stented (TAVR) valve is                  present in the aortic position. Procedure Date: 02/01/2022.  Sonographer:     Darlina Sicilian RDCS Referring Phys:  [place] Diagnosing Phys: Eleonore Chiquito MD PROCEDURE: After discussion of the risks and benefits of a TEE, an informed consent was obtained from the patient. The patient was intubated. The transesophogeal probe was passed without difficulty through the esophogus of the patient. Imaged were obtained with the patient in a supine position. Sedation performed by different physician. The patient was monitored while under deep sedation. Anesthestetic sedation was provided intravenously by Anesthesiology: '90mg'$  of Propofol, '60mg'$  of Lidocaine. Image quality was excellent. The patient's vital signs; including heart rate, blood pressure, and oxygen saturation; remained stable throughout the procedure. The patient developed no  complications during the procedure. IMPRESSIONS  1. TEE guided TAVR. 23 mm S3 deployed. Vmax 1.2 m/s, MG 2.0 mmHG, EOA 2.77 cm2, DI 0.80. Trivial PVL in the 12-1 o'clock position. Normal prosthesis. The aortic valve has been repaired/replaced. Aortic valve regurgitation is trivial. There is a 23 mm Sapien prosthetic (TAVR) valve present in the  aortic position. Procedure Date: 02/01/2022.  2. LAA sludge present. Low LAA velocity noted ~22 cm/s. Left atrial size was severely dilated. A left atrial/left atrial appendage thrombus was detected.  3. Left ventricular ejection fraction, by estimation, is 60 to 65%. The left ventricle has normal function.  4. Right ventricular systolic function is normal. The right ventricular size is mildly enlarged.  5. Right atrial size was severely dilated.  6. The mitral valve is degenerative. Mild mitral valve regurgitation. No evidence of mitral stenosis. Moderate to severe mitral annular calcification.  7. Tricuspid valve regurgitation is mild to moderate. FINDINGS  Left Ventricle: Left ventricular ejection fraction, by estimation, is 60 to 65%. The left ventricle has normal function. The left ventricular internal cavity size was normal in size. Right Ventricle: The right ventricular size is mildly enlarged. No increase in right ventricular wall thickness. Right ventricular systolic function is normal. Left Atrium: LAA sludge present. Low LAA velocity noted ~22 cm/s. Left atrial size was severely dilated. A left atrial/left atrial appendage thrombus was detected. Right Atrium: Right atrial size was severely dilated. Pericardium: Trivial pericardial effusion is present. Mitral Valve: The mitral valve is degenerative in appearance. Moderate to severe mitral annular calcification. Mild mitral valve regurgitation. No evidence of mitral valve stenosis. Tricuspid Valve: The tricuspid valve is grossly normal. Tricuspid valve regurgitation is mild to moderate. No evidence of tricuspid  stenosis. 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 Vmax 1.2 m/s, MG 2.0 mmHG, EOA 2.77 cm2, DI 0.80. Trivial PVL in the 12-1 o'clock position. Normal prosthesis. The aortic valve has been repaired/replaced. Aortic valve regurgitation is trivial. Aortic regurgitation PHT measures 693 msec. Aortic valve mean gradient measures 2.0 mmHg. Aortic valve peak gradient measures 5.7 mmHg. Aortic valve area, by VTI measures 2.77 cm. There is a 23 mm Sapien prosthetic, stented (TAVR) valve present in the aortic position. Procedure Date: 02/01/2022. Pulmonic Valve: The pulmonic valve was grossly normal. Pulmonic valve regurgitation is not visualized. No evidence of pulmonic stenosis. Aorta: The aortic root and ascending aorta are structurally normal, with no evidence of dilitation. There is minimal (Grade I) layered plaque involving the descending aorta. IAS/Shunts: The atrial septum is grossly normal.  LEFT VENTRICLE PLAX 2D LVOT diam:     2.10 cm LV SV:         59 LV SV Index:   38 LVOT Area:     3.46 cm  AORTIC VALVE AV Area (Vmax):    2.47 cm AV Area (Vmean):   1.22 cm AV Area (VTI):     2.77 cm AV Vmax:           119.00 cm/s AV Vmean:          158.100 cm/s AV VTI:            0.211 m AV Peak Grad:      5.7 mmHg AV Mean Grad:      2.0 mmHg LVOT Vmax:         84.70 cm/s LVOT Vmean:        55.900 cm/s LVOT VTI:          0.169 m LVOT/AV VTI ratio: 0.80 AI PHT:            693 msec  AORTA Ao Root diam: 3.14 cm Ao Asc diam:  2.90 cm TRICUSPID VALVE TR Peak grad:   27.5 mmHg TR Vmax:        262.00 cm/s  SHUNTS Systemic VTI:  0.17 m Systemic  Diam: 2.10 cm Eleonore Chiquito MD Electronically signed by Eleonore Chiquito MD Signature Date/Time: 02/01/2022/2:26:22 PM    Final    Structural Heart Procedure  Result Date: 02/01/2022 See surgical note for result.    Assessment and Plan:   CVA-patient presents with dysarthria and confusion.  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  Note at the time  of her procedure intraoperative transesophageal echocardiogram on June 6 showed left atrial appendage thrombus.  This is the likely culprit.  There did not appear to be significant abnormalities with the aortic valve replacement other than trace aortic insufficiency.  We will continue apixaban if okay with neurology.  I do not think she requires repeat TEE but will review with structural heart team. Status post TAVR Persistent atrial fibrillation-plan to continue bisoprolol for rate control.  Continue apixaban.  In the future can consider repeat attempt at cardioversion now that aortic valve has been replaced and severe aortic stenosis corrected. Coronary artery disease-mild to moderate nonobstructive coronary disease on previous catheterization.  Continue statin. History of chronic diastolic congestive heart failure-she is euvolemic.  Continue Lasix at present dose. Hypertension-continue present blood pressure medications.  Permissive hypertension in the setting of CVA. Hyperlipidemia-continue statin. Small abdominal aortic aneurysm/enlarged lymph node noted on previous CTA-Will need follow-up as an outpatient.   Risk Assessment/Risk Scores:   CHA2DS2-VASc Score = 4  This indicates a 4.8% annual risk of stroke. The patient's score is based upon: CHF History: 1 HTN History: 1 Diabetes History: 0 Stroke History: 0 Vascular Disease History: 0 Age Score: 1 Gender Score: 1       For questions or updates, please contact [place] Please consult [email or web address] for contact info under    Signed, Kirk Ruths, MD  02/05/2022 10:37 AM

## 2022-02-06 ENCOUNTER — Inpatient Hospital Stay (HOSPITAL_COMMUNITY): Payer: Medicare HMO

## 2022-02-06 DIAGNOSIS — R0602 Shortness of breath: Secondary | ICD-10-CM | POA: Diagnosis not present

## 2022-02-06 DIAGNOSIS — G9340 Encephalopathy, unspecified: Secondary | ICD-10-CM | POA: Diagnosis not present

## 2022-02-06 DIAGNOSIS — I639 Cerebral infarction, unspecified: Secondary | ICD-10-CM | POA: Diagnosis not present

## 2022-02-06 DIAGNOSIS — Z952 Presence of prosthetic heart valve: Secondary | ICD-10-CM | POA: Diagnosis not present

## 2022-02-06 DIAGNOSIS — I10 Essential (primary) hypertension: Secondary | ICD-10-CM | POA: Diagnosis not present

## 2022-02-06 LAB — COMPREHENSIVE METABOLIC PANEL
ALT: 51 U/L — ABNORMAL HIGH (ref 0–44)
AST: 34 U/L (ref 15–41)
Albumin: 3 g/dL — ABNORMAL LOW (ref 3.5–5.0)
Alkaline Phosphatase: 130 U/L — ABNORMAL HIGH (ref 38–126)
Anion gap: 9 (ref 5–15)
BUN: 13 mg/dL (ref 8–23)
CO2: 26 mmol/L (ref 22–32)
Calcium: 9.1 mg/dL (ref 8.9–10.3)
Chloride: 102 mmol/L (ref 98–111)
Creatinine, Ser: 0.58 mg/dL (ref 0.44–1.00)
GFR, Estimated: >60 mL/min (ref >60)
Glucose, Bld: 107 mg/dL — ABNORMAL HIGH (ref 70–99)
Potassium: 3.8 mmol/L (ref 3.5–5.1)
Sodium: 137 mmol/L (ref 135–145)
Total Bilirubin: 1.2 mg/dL (ref 0.3–1.2)
Total Protein: 6.4 g/dL — ABNORMAL LOW (ref 6.5–8.1)

## 2022-02-06 IMAGING — DX DG CHEST 1V PORT
2 series · 2 of 2 positions shown · non-contrast
Comparison: [DATE]

CLINICAL DATA: Shortness of breath.

EXAM:
PORTABLE CHEST 1 VIEW

[chest ap (1 of 2)]
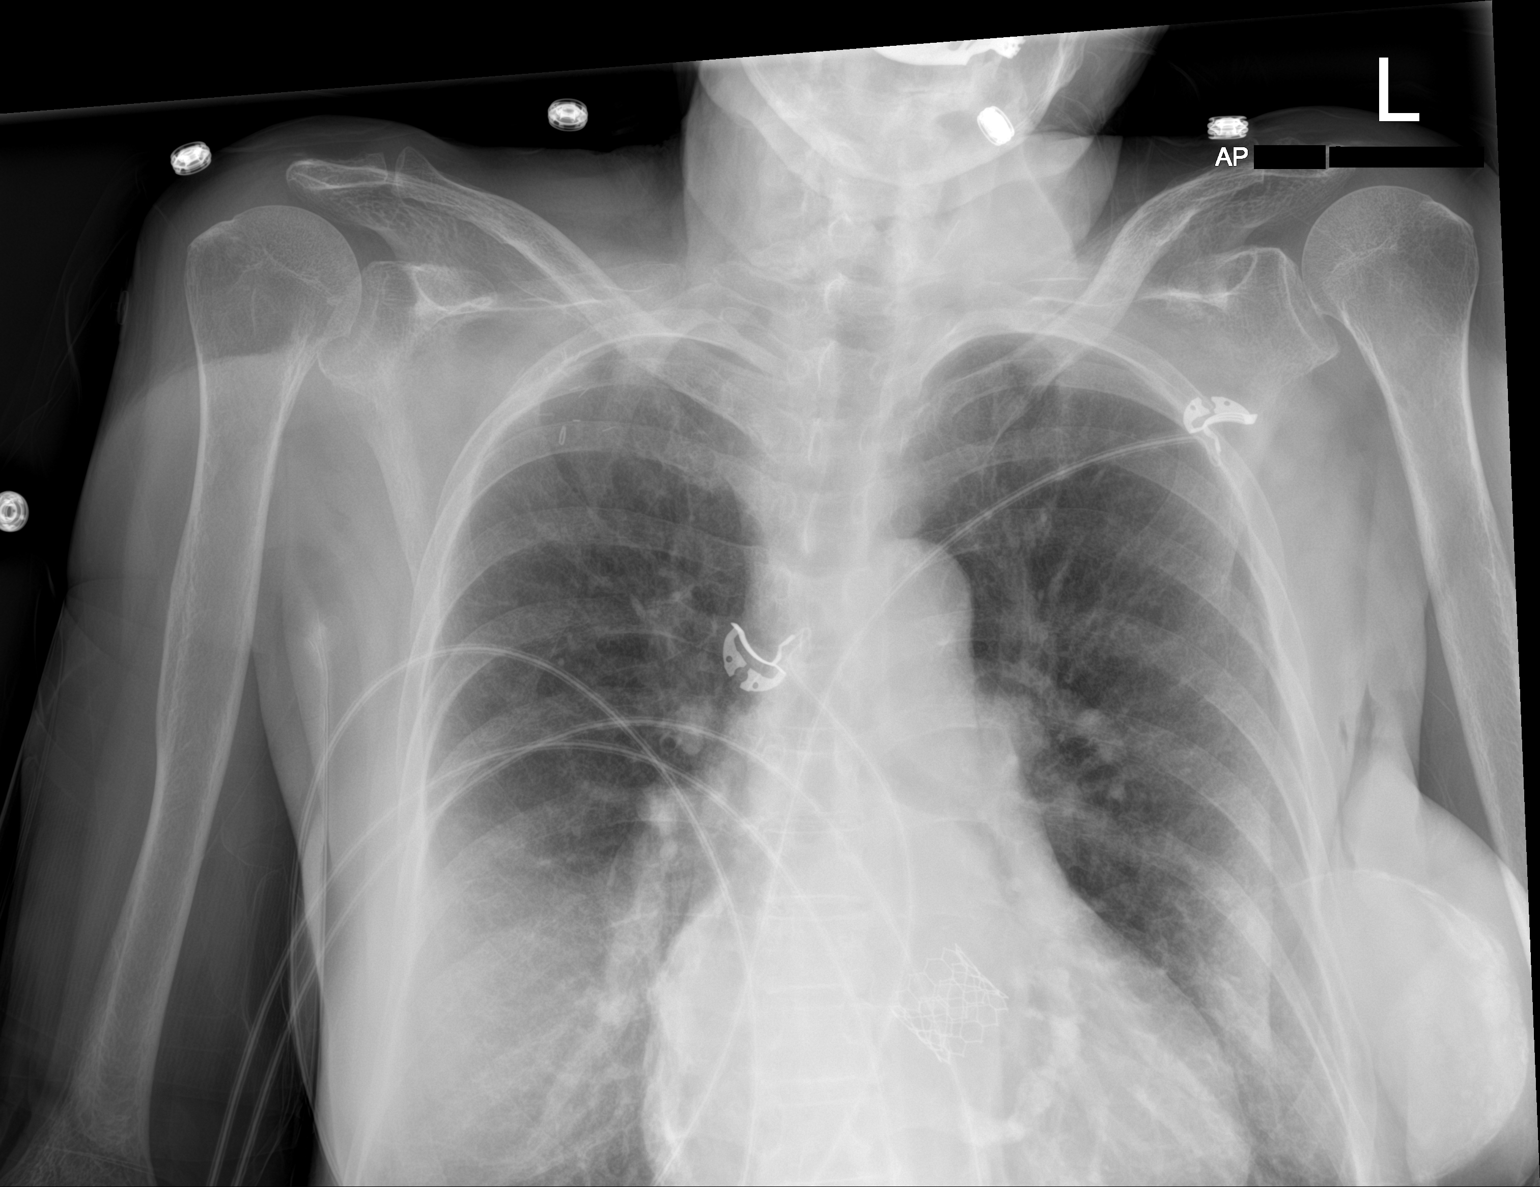

[chest ap (2 of 2)]
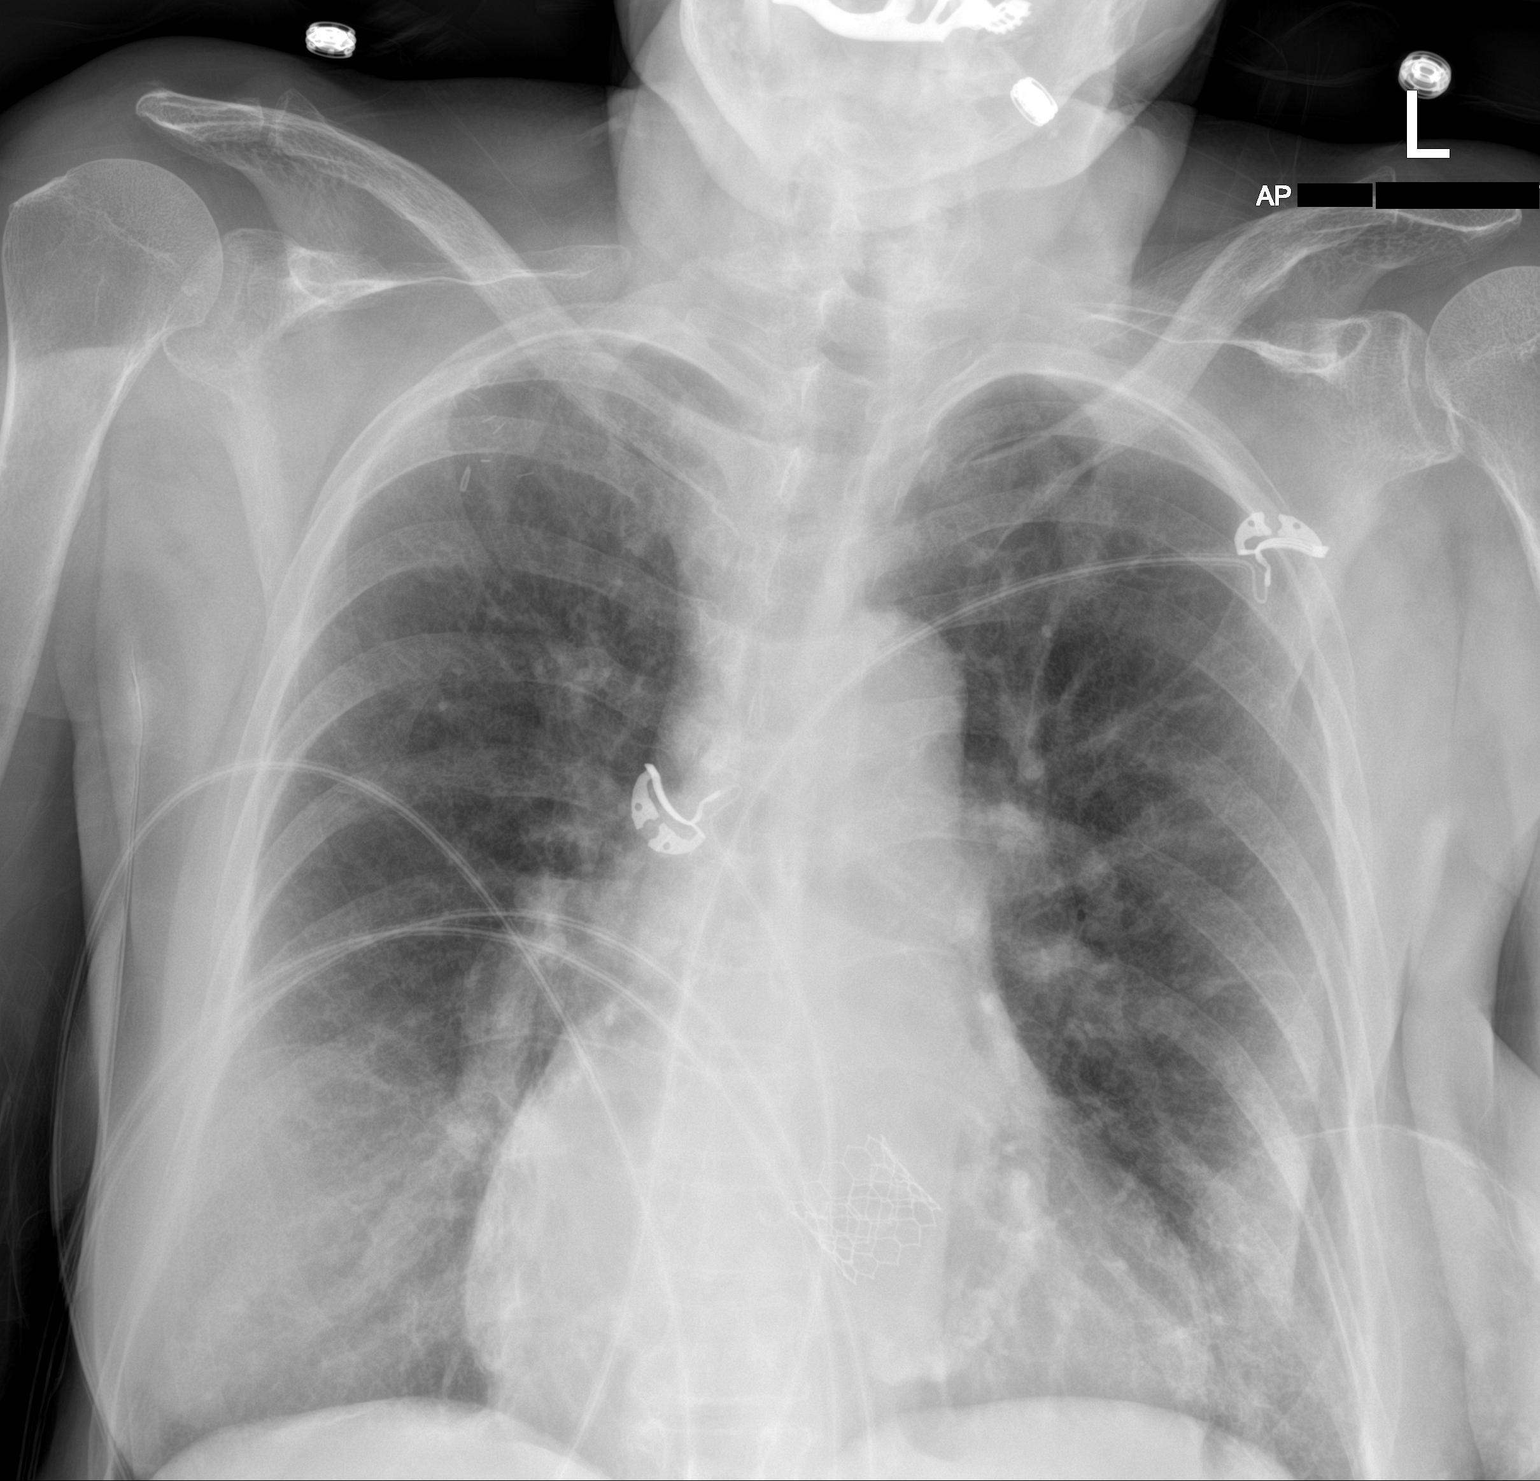

[2 of 2 positions shown; findings below may reference images not displayed]

FINDINGS: The lungs are clear without focal pneumonia, edema, pneumothorax or
pleural effusion. Interstitial markings are diffusely coarsened with
chronic features. The cardio pericardial silhouette is enlarged.
Telemetry leads overlie the chest.
IMPRESSION: Cardiomegaly with chronic interstitial coarsening.

## 2022-02-06 MED ORDER — ATORVASTATIN CALCIUM 20 MG PO TABS: 40.0000 mg | ORAL_TABLET | Freq: Every day | ORAL | 3 refills | Status: DC

## 2022-02-06 NOTE — Progress Notes (Signed)
AVS teaching given to patient and patient's niece Tammie Martin. All questions answered. PIVs and telemetry removed. Patients belongings sent home with patient's niece. Patient wheeled down to main entrance and discharged home per MD orders.

## 2022-02-06 NOTE — Progress Notes (Signed)
STROKE TEAM PROGRESS NOTE   INTERVAL HISTORY Patient is seen in her room with no family at the bedside.     Echocardiogram on 02/02/2022 had shown a mild paravalvular leak around the aortic prosthetic valve.  TEE on 02/02/2022 had shown left atrial clot.  Cardiology recommended patient continue on Eliquis.  EEG shows mild generalized slowing.  No epileptiform activity.  Vital signs are stable.  Neurological exam is unchanged.  Vitals:   02/05/22 2000 02/06/22 0000 02/06/22 0310 02/06/22 0823  BP: (!) 137/94 (!) 156/94 (!) 144/92 (!) 150/93  Pulse: 77 84 80   Resp: 20 (!) '21 20 18  '$ Temp: 98.2 F (36.8 C)  98 F (36.7 C) 97.6 F (36.4 C)  TempSrc: Oral  Oral Oral  SpO2: 93% 94% 90%   Weight:      Height:       CBC:  Recent Labs  Lab 02/02/22 0204 02/04/22 1250  WBC 10.3 11.6*  HGB 12.6 13.5  HCT 37.5 39.5  MCV 97.4 96.6  PLT 194 474   Basic Metabolic Panel:  Recent Labs  Lab 02/02/22 0204 02/04/22 1250 02/05/22 0953 02/06/22 0319  NA 136   < > 136 137  K 4.1   < > 4.0 3.8  CL 102   < > 99 102  CO2 26   < > 25 26  GLUCOSE 163*   < > 112* 107*  BUN 19   < > 8 13  CREATININE 0.57   < > 0.57 0.58  CALCIUM 8.6*   < > 9.2 9.1  MG 1.9  --   --   --    < > = values in this interval not displayed.   Lipid Panel:  Recent Labs  Lab 02/05/22 0941  CHOL 133  TRIG 89  HDL 36*  CHOLHDL 3.7  VLDL 18  LDLCALC 79   HgbA1c:  Recent Labs  Lab 02/04/22 1250  HGBA1C 5.6   Urine Drug Screen: No results for input(s): "LABOPIA", "COCAINSCRNUR", "LABBENZ", "AMPHETMU", "THCU", "LABBARB" in the last 168 hours.  Alcohol Level No results for input(s): "ETH" in the last 168 hours.  IMAGING past 24 hours DG Chest Port 1 View  Result Date: 02/06/2022 CLINICAL DATA:  Shortness of breath. EXAM: PORTABLE CHEST 1 VIEW COMPARISON:  01/28/2022 FINDINGS: The lungs are clear without focal pneumonia, edema, pneumothorax or pleural effusion. Interstitial markings are diffusely coarsened with  chronic features. The cardio pericardial silhouette is enlarged. Telemetry leads overlie the chest. IMPRESSION: Cardiomegaly with chronic interstitial coarsening. Electronically Signed   By: Tammie Martin M.D.   On: 02/06/2022 09:48   EEG adult  Result Date: 02/05/2022 Tammie Jack, MD     02/05/2022  3:49 PM Routine EEG Report Tammie Martin is a 71 y.o. female with a history of encephalopathy who is undergoing an EEG to evaluate for seizures. Report: This EEG was acquired with electrodes placed according to the International 10-20 electrode system (including Fp1, Fp2, F3, F4, C3, C4, P3, P4, O1, O2, T3, T4, T5, T6, A1, A2, Fz, Cz, Pz). The following electrodes were missing or displaced: none. The occipital dominant rhythm was primarily approximately 7 Hz with brief periods of normal alpha activity at 8.5 Hz. This activity is reactive to stimulation. Drowsiness was manifested by background fragmentation; deeper stages of sleep were identified by K complexes and sleep spindles. There was no focal slowing. There were no interictal epileptiform discharges. There were no electrographic seizures identified. Photic stimulation and  hyperventilation were not performed. Impression and clinical correlation: This EEG was obtained while awake and asleep and is abnormal due to mild diffuse slowing indicative of global cerebral dysfunction. Epileptiform abnormalities were not seen during this recording. Tammie Monks, MD [facility] [phone] If 7pm- 7am, please page neurology on call as listed in [place].    PHYSICAL EXAM General:  Well-nourished, well-developed elderly patient in no acute distress Respiratory:  Regular, unlabored respirations on room air  NEURO:  Mental Status: AA&Ox1 patient is awake but has poor insight into illness and registration.  Speech/Language: speech is without dysarthria or aphasia but with perseveration on different words.  Unable to name most objects  Cranial  Nerves:  II: PERRL.  III, IV, VI: EOMI. Eyelids elevate symmetrically.  V: Sensation is intact to light touch and symmetrical to face.  VII: Smile is symmetrical.  VIII: hearing intact to voice. IX, X: Phonation is normal.  XII: tongue is midline without fasciculations. Motor: 5/5 strength to all muscle groups tested.  Tone: is normal and bulk is normal Sensation- Intact to light touch bilaterally.  Coordination: FTN intact bilaterally.No drift.  Gait- deferred    ASSESSMENT/PLAN Tammie Martin is a 71 y.o. female with history of HTN, HLD, tobacco abuse, atrial fibrillation on Eliquis, AS s/p TAVR on 6/5 presenting with  confusion, headaches and visual disturbances on awakening.  Of note, she is on Eliquis for atrial fibrillation, but this was held for her TAVR procedure and resumed on 6/7.  MRI reveals a few punctate infarcts in different vascular territories, likely embolic.  On exam, she is oriented to self only, unable to name most objects and perseverates on different words.  Stroke:  bilateral punctate infarcts likely secondary due to embolism in setting of atrial fibrillation with anticoagulation held for procedure and presence of left atrial clot CT head No acute abnormality. Small vessel disease. Atrophy.  CTA head & neck No emergent LVO, left vertebral artery stenosis, bilateral carotid bifurcation atherosclerosis with less than 50% stenosis MRI  few punctate infarcts in different vascular territories, chronic microvascular ischemic changes 2D Echo EF 60 to 65%.  Aortic valve prosthesis with mild paravalvular leak.  Left atrium severely dilated.   LDL 79 HgbA1c 5.6 EEG mild generalized slowing VTE prophylaxis - fully anticoagulated with Eliquis    Diet   Diet Heart Room service appropriate? Yes; Fluid consistency: Thin   Eliquis (apixaban) daily prior to admission, now on Eliquis (apixaban) daily.  Therapy recommendations:  no PT follow up Disposition:   pending  Atrial fibrillation Patient has history of atrial fibrillation Home Eliquis was held for recent TAVR procedure Continue Eliquis  Hypertension Home meds:  bisoprolol 15 mg daily Stable Permissive hypertension (OK if < 220/120) but gradually normalize in 5-7 days Long-term BP goal normotensive  Hyperlipidemia Home meds:  atorvastatin 20 mg daily, increased to 40 mg LDL 79, goal < 70 Continue statin at discharge   Other Stroke Risk Factors Advanced Age >/= 61  Former Cigarette smoker  Other Active Problems none  Hospital day # 2  Patient presented with altered mental status and MRI shows multiple bilateral small embolic infarcts likely due to A-fib while anticoagulation was held for TAVR procedure and recently restarted 2 days ago.  Continue Eliquis.  Stroke team will sign off.  Kindly call for questions.  Discussed with Dr.Ghimire.  Greater than 50% time during this 25 minute visit was spent in counseling and coordination of care about embolic strokes and discussion with  patient and care team and answering questions.  Antony Contras, MD Medical Director [facility] Pager: [phone] 02/06/2022 12:25 PM   To contact Stroke Continuity provider, please refer to [email or web address] After hours, contact General Neurology

## 2022-02-06 NOTE — Progress Notes (Signed)
   Progress Note  Patient Name: Tammie Martin Date of Encounter: 02/06/2022  [facility] Cardiologist: Carlyle Dolly, MD   As outlined in consult note yesterday patient is status post recent TAVR.  Apixaban (for persistent atrial fibrillation) was held at time of procedure.  TEE during procedure did reveal left atrial appendage thrombus and this is the likely cause of CVA.  She is now back on apixaban and would continue.  No indication for repeat TEE.  No new recommendations.  We will continue to follow.  For questions or updates, please contact [place] Please consult [email or web address] for contact info under        Signed, Kirk Ruths, MD  02/06/2022, 7:49 AM

## 2022-02-06 NOTE — Discharge Summary (Signed)
PATIENT DETAILS Name: Tammie Martin Age: 71 y.o. Sex: female Date of Birth: 23-Oct-1950 MRN: [id number]. Admitting Physician: Albertine Patricia, MD OJJ:[id number], Koleen Distance, DO  Admit Date: 02/04/2022 Discharge date: 02/06/2022  Recommendations for Outpatient Follow-up:  Follow up with PCP in 1-2 weeks Please obtain CMP/CBC in one week Please ensure follow up cardiology.  Admitted From:  Home  Disposition: Home   Discharge Condition: good  CODE STATUS:   Code Status: Full Code   Diet recommendation:  Diet Order             Diet - low sodium heart healthy           Diet Heart Room service appropriate? Yes; Fluid consistency: Thin  Diet effective now                    Brief Summary: Patient is a 71 y.o.  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  See below for further details.     Significant events: 6/6-6/7>> hospitalization for TAVR-Eliquis resumed on 6/7. 6/9>> presented to [facility] ED with confusion/headache-embolic appearing CVA on MRI brain.  Admit to [facility] and subsequently transferred to [facility].     Significant studies: 6/09>> CT angio head/neck: No LVO, moderate to severe left vertebral artery stenosis at origin, bilateral carotid artery atherosclerosis without greater than 50% stenosis. 6/09>> MRI brain: Acute infarct involving left frontal cortex/left posterior temporal cortex, bilateral cerebellum. 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  Suspicion that she may have developed this thrombus when she was off Eliquis for her TAVR procedure.   Discussed with cardiology/neurology-no need to repeat echocardiogram-this is not considered Eliquis failure-okay to discharge on Eliquis. .   Acute metabolic encephalopathy: Resolved-probably was due to CVA.  No evidence of UTI clinically-EEG without any seizures.  She is back to her baseline.     History of severe aortic stenosis-s/p TAVR 6/6: Please ensure follow-up with structural heart team.  Discussed with Dr. Leighton Roach need to repeat echocardiogram.   Persistent atrial fibrillation: Eliquis held for TAVR-but resumed since 6/7-see above.   Chronic HFpEF: Euvolemic on exam.   Hyponatremia: Was mild-resolved.  Hypokalemia: Repleted.   Transaminitis: Mild-unclear significance-downtrending-recheck in 1 week.   HTN: Resume bisoprolol.  Hypothyroidism: Continue Synthroid-TSH on 6/10 within normal limit.   COPD: Appears stable-continue bronchodilators   History of macular degeneration/retinal detachment: Per daughter-patient apparently has poor vision at baseline-she apparently has a history of right retinal detachment.  On day of admission--family thinks she had significant deterioration of vision-Per patient avoid doctor this morning-she seems to think that her vision has improved and is back to her baseline.   Tobacco abuse: Counseled   BMI: Estimated body mass index is 21.01 kg/m as calculated from the following:   Height as of this encounter: '5\' 2"'$  (1.575 m).   Weight as of this encounter: 52.1 kg.    Discharge Diagnoses:  Principal Problem:   CVA (cerebral vascular accident) [facility]) Active Problems:   Essential hypertension, benign   Hyperlipidemia LDL goal <130   Hypothyroidism   Severe aortic stenosis   Tobacco abuse  S/P TAVR (transcatheter aortic valve replacement)   Discharge Instructions:  Activity:  As tolerated with Full fall precautions use walker/cane & assistance as needed Discharge Instructions     Ambulatory referral to Neurology   Complete by: As  directed    An appointment is requested in approximately: 8 weeks   Diet - low sodium heart healthy   Complete by: As directed    Discharge instructions   Complete by: As directed    Follow with Primary MD  Janora Norlander, DO in 1-2 weeks  Please get a complete blood count and chemistry panel checked by your Primary MD at your next visit, and again as instructed by your Primary MD.  Get Medicines reviewed and adjusted: Please take all your medications with you for your next visit with your Primary MD  Laboratory/radiological data: Please request your Primary MD to go over all hospital tests and procedure/radiological results at the follow up, please ask your Primary MD to get all Hospital records sent to his/her office.  In some cases, they will be blood work, cultures and biopsy results pending at the time of your discharge. Please request that your primary care M.D. follows up on these results.  Also Note the following: If you experience worsening of your admission symptoms, develop shortness of breath, life threatening emergency, suicidal or homicidal thoughts you must seek medical attention immediately by calling 911 or calling your MD immediately  if symptoms less severe.  You must read complete instructions/literature along with all the possible adverse reactions/side effects for all the Medicines you take and that have been prescribed to you. Take any new Medicines after you have completely understood and accpet all the possible adverse reactions/side effects.   Do not drive when taking Pain medications or sleeping medications (Benzodaizepines)  Do not take more than prescribed Pain, Sleep and Anxiety Medications. It is not advisable to combine anxiety,sleep and pain medications without talking with your primary care practitioner  Special Instructions: If you have smoked or chewed Tobacco  in the last 2 yrs please stop smoking, stop any regular Alcohol  and or any Recreational  drug use.  Wear Seat belts while driving.  Please note: You were cared for by a hospitalist during your hospital stay. Once you are discharged, your primary care physician will handle any further medical issues. Please note that NO REFILLS for any discharge medications will be authorized once you are discharged, as it is imperative that you return to your primary care physician (or establish a relationship with a primary care physician if you do not have one) for your post hospital discharge needs so that they can reassess your need for medications and monitor your lab values.   Increase activity slowly   Complete by: As directed    No wound care   Complete by: As directed       Allergies as of 02/06/2022       Reactions   Penicillins Hives   Singulair [montelukast Sodium]    headache   Codeine Rash   Nicoderm [nicotine] Hives, Swelling   Other Rash   Band-aids cause rash after a few days        Medication List     TAKE these medications    acetaminophen 500 MG tablet Commonly known as: TYLENOL Take 1,000 mg by mouth every 6 (six) hours as needed for moderate pain.   albuterol 108 (90 Base) MCG/ACT inhaler Commonly known as: VENTOLIN HFA Inhale 2 puffs into the  lungs every 6 (six) hours as needed for wheezing or shortness of breath.   atorvastatin 20 MG tablet Commonly known as: LIPITOR Take 2 tablets (40 mg total) by mouth daily. What changed: how much to take   bisoprolol 10 MG tablet Commonly known as: ZEBETA Take 1.5 tablets (15 mg total) by mouth daily.   CALCIUM-MAGNESIUM PO Take 1 tablet by mouth daily.   cetirizine 10 MG tablet Commonly known as: ZYRTEC Take 10 mg by mouth daily.   desvenlafaxine 50 MG 24 hr tablet Commonly known as: Pristiq Take 1 tablet (50 mg total) by mouth daily.   Eliquis 5 MG Tabs tablet Generic drug: apixaban TAKE ONE TABLET BY MOUTH TWICE DAILY   fluticasone 50 MCG/ACT nasal spray Commonly known as: FLONASE Place 2  sprays into both nostrils daily as needed for allergies or rhinitis.   furosemide 20 MG tablet Commonly known as: LASIX TAKE ONE TABLET BY MOUTH DAILY   hydrocortisone cream 1 % Apply 1 application. topically 2 (two) times daily as needed (rash).   levothyroxine 88 MCG tablet Commonly known as: SYNTHROID Take 1 tablet by mouth daily. What changed: when to take this   Omega-3-6-9 Caps Take 1 capsule by mouth 2 (two) times daily.   potassium chloride 10 MEQ tablet Commonly known as: KLOR-CON M Take 1 tablet (10 mEq total) by mouth daily.   PRESERVISION AREDS 2 PO Take 1 capsule by mouth in the morning and at bedtime.   ALIVE WOMENS 50+ PO Take 1 tablet by mouth daily.   PROBIOTIC ADVANCED PO Take 1 capsule by mouth daily.   SYSTANE OP Place 1 drop into both eyes daily as needed (dry eyes).   tobramycin 0.3 % ophthalmic solution Commonly known as: TOBREX Place 1 drop into the right eye See admin instructions. Instill 1 drop into the right eye three times daily, the day before, day of and day after eye injections   traZODone 50 MG tablet Commonly known as: DESYREL Take 0.5-1 tablets (25-50 mg total) by mouth at bedtime as needed for sleep. What changed: how much to take   TURMERIC PO Take 1 capsule by mouth 2 (two) times daily.   umeclidinium-vilanterol 62.5-25 MCG/ACT Aepb Commonly known as: ANORO ELLIPTA Inhale 1 puff into the lungs daily at 6 (six) AM.   Vitamin D 50 MCG (2000 UT) tablet Take 2,000 Units by mouth daily.        Allergies  Allergen Reactions   Penicillins Hives   Singulair [Montelukast Sodium]     headache   Codeine Rash   Nicoderm [Nicotine] Hives and Swelling   Other Rash    Band-aids cause rash after a few days     Other Procedures/Studies: DG Chest Port 1 View  Result Date: 02/06/2022 CLINICAL DATA:  Shortness of breath. EXAM: PORTABLE CHEST 1 VIEW COMPARISON:  01/28/2022 FINDINGS: The lungs are clear without focal pneumonia,  edema, pneumothorax or pleural effusion. Interstitial markings are diffusely coarsened with chronic features. The cardio pericardial silhouette is enlarged. Telemetry leads overlie the chest. IMPRESSION: Cardiomegaly with chronic interstitial coarsening. Electronically Signed   By: Misty Stanley M.D.   On: 02/06/2022 09:48   EEG adult  Result Date: 02/05/2022 Derek Jack, MD     02/05/2022  3:49 PM Routine EEG Report DAVENE JOBIN is a 71 y.o. female with a history of encephalopathy who is undergoing an EEG to evaluate for seizures. Report: This EEG was acquired with electrodes placed according to the International  10-20 electrode system (including Fp1, Fp2, F3, F4, C3, C4, P3, P4, O1, O2, T3, T4, T5, T6, A1, A2, Fz, Cz, Pz). The following electrodes were missing or displaced: none. The occipital dominant rhythm was primarily approximately 7 Hz with brief periods of normal alpha activity at 8.5 Hz. This activity is reactive to stimulation. Drowsiness was manifested by background fragmentation; deeper stages of sleep were identified by K complexes and sleep spindles. There was no focal slowing. There were no interictal epileptiform discharges. There were no electrographic seizures identified. Photic stimulation and hyperventilation were not performed. Impression and clinical correlation: This EEG was obtained while awake and asleep and is abnormal due to mild diffuse slowing indicative of global cerebral dysfunction. Epileptiform abnormalities were not seen during this recording. Su Monks, MD [facility] [phone] If 7pm- 7am, please page neurology on call as listed in [place].   MR BRAIN WO CONTRAST  Result Date: 02/04/2022 CLINICAL DATA:  Neuro deficit, acute, stroke suspected; VISION DEFICIT EXAM: MRI HEAD WITHOUT CONTRAST TECHNIQUE: Multiplanar, multiecho pulse sequences of the brain and surrounding structures were obtained without intravenous contrast. COMPARISON:  None  Available. FINDINGS: Brain: Few small scattered foci of diffusion hyperintensity including left frontal cortex, left posterior temporal cortex, and bilateral cerebellum. No evidence of intracranial hemorrhage. Ventricles and sulci are within normal limits in size and configuration. Patchy and confluent areas of T2 hyperintensity in the supratentorial and pontine white matter are nonspecific but may reflect mild to moderate chronic microvascular ischemic changes. Vascular: Major vessel flow voids at the skull base are preserved. Skull and upper cervical spine: Normal marrow signal is preserved. Sinuses/Orbits: Paranasal sinuses are aerated. Orbits are unremarkable. Other: Sella is unremarkable.  Mastoid air cells are clear. IMPRESSION: Few punctate acute to subacute infarcts involving different vascular territories. No hemorrhage. Chronic microvascular ischemic changes. Electronically Signed   By: Macy Mis M.D.   On: 02/04/2022 16:21   CT ANGIO HEAD NECK W WO CM  Result Date: 02/04/2022 CLINICAL DATA:  Neuro deficit, acute, stroke suspected EXAM: CT ANGIOGRAPHY HEAD AND NECK TECHNIQUE: Multidetector CT imaging of the head and neck was performed using the standard protocol during bolus administration of intravenous contrast. Multiplanar CT image reconstructions and MIPs were obtained to evaluate the vascular anatomy. Carotid stenosis measurements (when applicable) are obtained utilizing NASCET criteria, using the distal internal carotid diameter as the denominator. RADIATION DOSE REDUCTION: This exam was performed according to the departmental dose-optimization program which includes automated exposure control, adjustment of the mA and/or kV according to patient size and/or use of iterative reconstruction technique. CONTRAST:  178m OMNIPAQUE IOHEXOL 350 MG/ML SOLN COMPARISON:  None Available. FINDINGS: CT HEAD FINDINGS Brain: No evidence of acute large vascular territory infarction, hemorrhage,  hydrocephalus, extra-axial collection or mass lesion/mass effect. Patchy white matter hypodensities, nonspecific but compatible with chronic microvascular ischemic disease. Vascular: Detailed below. Skull: No acute fracture. Sinuses: Visualized sinuses are clear. Orbits: No acute finding. Review of the MIP images confirms the above findings CTA NECK FINDINGS Aortic arch: Aortic atherosclerosis. Great vessel origins are patent with mild-to-moderate atherosclerotic narrowing. Apparent linear filling defect within the brachiocephalic artery is favored artifactual given artifact this region. Right carotid system: Atherosclerosis at the carotid bifurcation without greater than 50% stenosis. Left carotid system: Atherosclerosis involving the common carotid artery and at the carotid bifurcation without greater than 50% stenosis. Vertebral arteries: Moderate to severe left vertebral artery origin stenosis. Otherwise, vertebral arteries are patent without significant stenosis. Skeleton: Severe multilevel degenerative disc disease. Multilevel  facet uncovertebral hypertrophy with varying degrees of neural foraminal stenosis. Other neck: No acute findings. Upper chest: Emphysema. Review of the MIP images confirms the above findings CTA HEAD FINDINGS Anterior circulation: Bilateral intracranial ICAs, MCAs, and ACAs are patent without proximal hemodynamically significant stenosis Posterior circulation: Bilateral intradural vertebral arteries, basilar artery and bilateral posterior cerebral arteries are patent without proximal hemodynamically significant stenosis. No aneurysm identified. Venous sinuses: As permitted by contrast timing, patent. Review of the MIP images confirms the above findings IMPRESSION: 1. No emergent large vessel occlusion. 2. 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 3. Bilateral carotid bifurcation atherosclerosis without greater than 50% stenosis. 4. Aortic Atherosclerosis (ICD10-I70.0) and  Emphysema (ICD10-J43.9). Electronically Signed   By: Margaretha Sheffield M.D.   On: 02/04/2022 14:31   ECHOCARDIOGRAM COMPLETE  Result Date: 02/02/2022    ECHOCARDIOGRAM REPORT   Patient Name:   Tammie Martin Date of Exam: 02/02/2022 Medical Rec #:  [id number]         Height:       62.5 in Accession #:    [id number]        Weight:       125.7 lb Date of Birth:  1951/01/25         BSA:          1.578 m Patient Age:    53 years          BP:           137/87 mmHg Patient Gender: F                 HR:           95 bpm. 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 COPD, Aortic Valve Disease, Arrythmias:Atrial                 Fibrillation; Risk Factors:Hypertension, Dyslipidemia and                 Current Smoker. Thyroid disease.                 Aortic Valve: 23 mm Sapien prosthetic, stented (TAVR) valve is                 present in the aortic position. Procedure Date: 01/01/2022.  Sonographer:    Darlina Sicilian RDCS Referring Phys: [id number] KATHRYN R THOMPSON IMPRESSIONS  1. 23 mm S3. Mild paravalvular leak. Vmax 1.6 m/s, MG 5.0 mmHG, EOA 2.0 cm2, DI 0.55. Prosthesis within normal limits. The aortic valve has been repaired/replaced. Aortic valve regurgitation is mild. There is a 23 mm Sapien prosthetic (TAVR) valve present in the aortic position. Procedure Date: 01/01/2022.  2. Left ventricular ejection fraction, by estimation, is 60 to 65%. The left ventricle has normal function. The left ventricle has no regional wall motion abnormalities. Left ventricular diastolic function could not be evaluated.  3. Right ventricular systolic function is normal. The right ventricular size is mildly enlarged. There is severely elevated pulmonary artery systolic pressure.  4. Left atrial size was severely dilated.  5. Right atrial size  was severely dilated.  6. The mitral valve is degenerative. Trivial mitral valve regurgitation. Mild mitral stenosis. The mean mitral  valve gradient is 4.0 mmHg with average heart rate of 80 bpm. Moderate to severe mitral annular calcification.  7. The inferior vena cava is dilated in size with <50% respiratory variability, suggesting right atrial pressure of 15 mmHg. FINDINGS  Left Ventricle: Left ventricular ejection fraction, by estimation, is 60 to 65%. The left ventricle has normal function. The left ventricle has no regional wall motion abnormalities. The left ventricular internal cavity size was normal in size. There is  no left ventricular hypertrophy. Left ventricular diastolic function could not be evaluated due to atrial fibrillation. Left ventricular diastolic function could not be evaluated. Right Ventricle: The right ventricular size is mildly enlarged. No increase in right ventricular wall thickness. Right ventricular systolic function is normal. There is severely elevated pulmonary artery systolic pressure. The tricuspid regurgitant velocity is 3.62 m/s, and with an assumed right atrial pressure of 15 mmHg, the estimated right ventricular systolic pressure is 27.7 mmHg. Left Atrium: Left atrial size was severely dilated. Right Atrium: Right atrial size was severely dilated. Pericardium: There is no evidence of pericardial effusion. Mitral Valve: The mitral valve is degenerative in appearance. Moderate to severe mitral annular calcification. Trivial mitral valve regurgitation. Mild mitral valve stenosis. MV peak gradient, 7.7 mmHg. The mean mitral valve gradient is 4.0 mmHg with average heart rate of 80 bpm. Tricuspid Valve: The tricuspid valve is grossly normal. Tricuspid valve regurgitation is mild. Aortic Valve: 23 mm S3. Mild paravalvular leak. Vmax 1.6 m/s, MG 5.0 mmHG, EOA 2.0 cm2, DI 0.55. Prosthesis within normal limits. The aortic valve has been repaired/replaced. Aortic valve regurgitation  is mild. Aortic valve mean gradient measures 5.0 mmHg. Aortic valve peak gradient measures 10.1 mmHg. Aortic valve area, by VTI measures 2.00 cm. There is a 23 mm Sapien prosthetic, stented (TAVR) valve present in the aortic position. Procedure Date: 01/01/2022. Pulmonic Valve: The pulmonic valve was grossly normal. Pulmonic valve regurgitation is trivial. No evidence of pulmonic stenosis. Aorta: The aortic root and ascending aorta are structurally normal, with no evidence of dilitation. Venous: The inferior vena cava is dilated in size with less than 50% respiratory variability, suggesting right atrial pressure of 15 mmHg. IAS/Shunts: The atrial septum is grossly normal.  LEFT VENTRICLE PLAX 2D LVOT diam:     2.15 cm   Diastology LV SV:         61        LV e' medial:    3.94 cm/s LV SV Index:   39        LV E/e' medial:  32.0 LVOT Area:     3.63 cm  LV e' lateral:   11.20 cm/s                          LV E/e' lateral: 11.3  RIGHT VENTRICLE RV S prime:     9.38 cm/s TAPSE (M-mode): 2.0 cm LEFT ATRIUM              Index        RIGHT ATRIUM           Index LA Vol (A2C):   129.0 ml 81.73 ml/m  RA Area:     31.20 cm LA Vol (A4C):   128.0 ml 81.09 ml/m  RA Volume:   100.00 ml 63.35 ml/m LA Biplane Vol: 130.0 ml 82.36 ml/m  AORTIC VALVE AV Area (Vmax):    1.84 cm AV Area (Vmean):   1.93 cm AV Area (VTI):  2.00 cm AV Vmax:           159.00 cm/s AV Vmean:          106.500 cm/s AV VTI:            0.306 m AV Peak Grad:      10.1 mmHg AV Mean Grad:      5.0 mmHg LVOT Vmax:         80.40 cm/s LVOT Vmean:        56.650 cm/s LVOT VTI:          0.168 m LVOT/AV VTI ratio: 0.55  AORTA Ao Asc diam: 3.15 cm MITRAL VALVE                TRICUSPID VALVE MV Area (PHT): 2.90 cm     TR Peak grad:   52.4 mmHg MV Peak grad:  7.7 mmHg     TR Vmax:        362.00 cm/s MV Mean grad:  4.0 mmHg MV Vmax:       1.39 m/s     SHUNTS MV Vmean:      89.0 cm/s    Systemic VTI:  0.17 m MV Decel Time: 262 msec     Systemic Diam: 2.15 cm MV E  velocity: 126.00 cm/s Eleonore Chiquito MD Electronically signed by Eleonore Chiquito MD Signature Date/Time: 02/02/2022/11:47:25 AM    Final    ECHO TEE  Result Date: 02/01/2022    TRANSESOPHOGEAL ECHO REPORT   Patient Name:   GABY HARNEY Date of Exam: 02/01/2022 Medical Rec #:  [id number]         Height:       62.5 in Accession #:    [id number]        Weight:       120.0 lb Date of Birth:  1951-04-14         BSA:          1.548 m Patient Age:    26 years          BP:           110/97 mmHg Patient Gender: F                 HR:           91 bpm. Exam Location:  Inpatient Procedure: Transesophageal Echo, Cardiac Doppler and Color Doppler Indications:     Aortic stenosis, severe  History:         Patient has prior history of Echocardiogram examinations. COPD,                  Aortic Valve Disease, Arrythmias:Atrial Fibrillation; Risk                  Factors:Hypertension, Dyslipidemia and Current Smoker. Thyroid                  disease.                  Aortic Valve: 23 mm Sapien prosthetic, stented (TAVR) valve is                  present in the aortic position. Procedure Date: 02/01/2022.  Sonographer:     Darlina Sicilian RDCS Referring Phys:  [place] Diagnosing Phys: Eleonore Chiquito MD PROCEDURE: After discussion of the risks and benefits of a TEE, an informed consent was obtained from the patient. The patient was intubated. The transesophogeal probe was  passed without difficulty through the esophogus of the patient. Imaged were obtained with the patient in a supine position. Sedation performed by different physician. The patient was monitored while under deep sedation. Anesthestetic sedation was provided intravenously by Anesthesiology: '90mg'$  of Propofol, '60mg'$  of Lidocaine. Image quality was excellent. The patient's vital signs; including heart rate, blood pressure, and oxygen saturation; remained stable throughout the procedure. The patient developed no complications during the procedure. IMPRESSIONS  1. TEE  guided TAVR. 23 mm S3 deployed. Vmax 1.2 m/s, MG 2.0 mmHG, EOA 2.77 cm2, DI 0.80. Trivial PVL in the 12-1 o'clock position. Normal prosthesis. The aortic valve has been repaired/replaced. Aortic valve regurgitation is trivial. There is a 23 mm Sapien prosthetic (TAVR) valve present in the aortic position. Procedure Date: 02/01/2022.  2. LAA sludge present. Low LAA velocity noted ~22 cm/s. Left atrial size was severely dilated. A left atrial/left atrial appendage thrombus was detected.  3. Left ventricular ejection fraction, by estimation, is 60 to 65%. The left ventricle has normal function.  4. Right ventricular systolic function is normal. The right ventricular size is mildly enlarged.  5. Right atrial size was severely dilated.  6. The mitral valve is degenerative. Mild mitral valve regurgitation. No evidence of mitral stenosis. Moderate to severe mitral annular calcification.  7. Tricuspid valve regurgitation is mild to moderate. FINDINGS  Left Ventricle: Left ventricular ejection fraction, by estimation, is 60 to 65%. The left ventricle has normal function. The left ventricular internal cavity size was normal in size. Right Ventricle: The right ventricular size is mildly enlarged. No increase in right ventricular wall thickness. Right ventricular systolic function is normal. Left Atrium: LAA sludge present. Low LAA velocity noted ~22 cm/s. Left atrial size was severely dilated. A left atrial/left atrial appendage thrombus was detected. Right Atrium: Right atrial size was severely dilated. Pericardium: Trivial pericardial effusion is present. Mitral Valve: The mitral valve is degenerative in appearance. Moderate to severe mitral annular calcification. Mild mitral valve regurgitation. No evidence of mitral valve stenosis. Tricuspid Valve: The tricuspid valve is grossly normal. Tricuspid valve regurgitation is mild to moderate. No evidence of tricuspid stenosis. 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  Vmax 1.2 m/s, MG 2.0 mmHG, EOA 2.77 cm2, DI 0.80. Trivial PVL in the 12-1 o'clock position. Normal prosthesis. The aortic valve has been repaired/replaced. Aortic valve regurgitation is trivial. Aortic regurgitation PHT measures 693 msec. Aortic valve mean gradient measures 2.0 mmHg. Aortic valve peak gradient measures 5.7 mmHg. Aortic valve area, by VTI measures 2.77 cm. There is a 23 mm Sapien prosthetic, stented (TAVR) valve present in the aortic position. Procedure Date: 02/01/2022. Pulmonic Valve: The pulmonic valve was grossly normal. Pulmonic valve regurgitation is not visualized. No evidence of pulmonic stenosis. Aorta: The aortic root and ascending aorta are structurally normal, with no evidence of dilitation. There is minimal (Grade I) layered plaque involving the descending aorta. IAS/Shunts: The atrial septum is grossly normal.  LEFT VENTRICLE PLAX 2D LVOT diam:     2.10 cm LV SV:         59 LV SV Index:   38 LVOT Area:     3.46 cm  AORTIC VALVE AV Area (Vmax):    2.47 cm AV Area (Vmean):   1.22 cm AV Area (VTI):     2.77 cm AV Vmax:           119.00 cm/s AV Vmean:          158.100 cm/s AV VTI:  0.211 m AV Peak Grad:      5.7 mmHg AV Mean Grad:      2.0 mmHg LVOT Vmax:         84.70 cm/s LVOT Vmean:        55.900 cm/s LVOT VTI:          0.169 m LVOT/AV VTI ratio: 0.80 AI PHT:            693 msec  AORTA Ao Root diam: 3.14 cm Ao Asc diam:  2.90 cm TRICUSPID VALVE TR Peak grad:   27.5 mmHg TR Vmax:        262.00 cm/s  SHUNTS Systemic VTI:  0.17 m Systemic Diam: 2.10 cm Eleonore Chiquito MD Electronically signed by Eleonore Chiquito MD Signature Date/Time: 02/01/2022/2:26:22 PM    Final    Structural Heart Procedure  Result Date: 02/01/2022 See surgical note for result.  DG Chest 2 View  Result Date: 01/30/2022 CLINICAL DATA:  Preop for aortic valve replacement. EXAM: CHEST - 2 VIEW COMPARISON:  Two-view chest x-ray a E 09/10/2021 FINDINGS: The heart is enlarged. No edema or effusion is present.  Mitral annular calcifications are present. Changes of COPD noted. No edema or effusion is present. No focal airspace disease is present. Bilateral breast implants noted. IMPRESSION: Cardiomegaly without failure. Electronically Signed   By: San Morelle M.D.   On: 01/30/2022 07:24     TODAY-DAY OF DISCHARGE:  Subjective:   Charlott Holler today has no headache,no chest abdominal pain,no new weakness tingling or numbness, feels much better wants to go home today.  Objective:   Blood pressure (!) 150/93, pulse 80, temperature 97.6 F (36.4 C), temperature source Oral, resp. rate 18, height '5\' 2"'$  (1.575 m), weight 52.1 kg, SpO2 90 %.  Intake/Output Summary (Last 24 hours) at 02/06/2022 1003 Last data filed at 02/06/2022 1001 Gross per 24 hour  Intake 720 ml  Output --  Net 720 ml   Filed Weights   02/04/22 2238  Weight: 52.1 kg    Exam: Awake Alert, Oriented *3, No new F.N deficits, Normal affect [place].AT,PERRAL Supple Neck,No JVD, No cervical lymphadenopathy appriciated.  Symmetrical Chest wall movement, Good air movement bilaterally, CTAB RRR,No Gallops,Rubs or new Murmurs, No Parasternal Heave +ve B.Sounds, Abd Soft, Non tender, No organomegaly appriciated, No rebound -guarding or rigidity. No Cyanosis, Clubbing or edema, No new Rash or bruise   PERTINENT RADIOLOGIC STUDIES: DG Chest Port 1 View  Result Date: 02/06/2022 CLINICAL DATA:  Shortness of breath. EXAM: PORTABLE CHEST 1 VIEW COMPARISON:  01/28/2022 FINDINGS: The lungs are clear without focal pneumonia, edema, pneumothorax or pleural effusion. Interstitial markings are diffusely coarsened with chronic features. The cardio pericardial silhouette is enlarged. Telemetry leads overlie the chest. IMPRESSION: Cardiomegaly with chronic interstitial coarsening. Electronically Signed   By: Misty Stanley M.D.   On: 02/06/2022 09:48   EEG adult  Result Date: 02/05/2022 Derek Jack, MD     02/05/2022  3:49 PM Routine EEG  Report Tammie Martin is a 71 y.o. female with a history of encephalopathy who is undergoing an EEG to evaluate for seizures. Report: This EEG was acquired with electrodes placed according to the International 10-20 electrode system (including Fp1, Fp2, F3, F4, C3, C4, P3, P4, O1, O2, T3, T4, T5, T6, A1, A2, Fz, Cz, Pz). The following electrodes were missing or displaced: none. The occipital dominant rhythm was primarily approximately 7 Hz with brief periods of normal alpha activity at 8.5 Hz. This activity is  reactive to stimulation. Drowsiness was manifested by background fragmentation; deeper stages of sleep were identified by K complexes and sleep spindles. There was no focal slowing. There were no interictal epileptiform discharges. There were no electrographic seizures identified. Photic stimulation and hyperventilation were not performed. Impression and clinical correlation: This EEG was obtained while awake and asleep and is abnormal due to mild diffuse slowing indicative of global cerebral dysfunction. Epileptiform abnormalities were not seen during this recording. Su Monks, MD [facility] [phone] If 7pm- 7am, please page neurology on call as listed in [facility].   MR BRAIN WO CONTRAST  Result Date: 02/04/2022 CLINICAL DATA:  Neuro deficit, acute, stroke suspected; VISION DEFICIT EXAM: MRI HEAD WITHOUT CONTRAST TECHNIQUE: Multiplanar, multiecho pulse sequences of the brain and surrounding structures were obtained without intravenous contrast. COMPARISON:  None Available. FINDINGS: Brain: Few small scattered foci of diffusion hyperintensity including left frontal cortex, left posterior temporal cortex, and bilateral cerebellum. No evidence of intracranial hemorrhage. Ventricles and sulci are within normal limits in size and configuration. Patchy and confluent areas of T2 hyperintensity in the supratentorial and pontine white matter are nonspecific but may reflect mild to moderate  chronic microvascular ischemic changes. Vascular: Major vessel flow voids at the skull base are preserved. Skull and upper cervical spine: Normal marrow signal is preserved. Sinuses/Orbits: Paranasal sinuses are aerated. Orbits are unremarkable. Other: Sella is unremarkable.  Mastoid air cells are clear. IMPRESSION: Few punctate acute to subacute infarcts involving different vascular territories. No hemorrhage. Chronic microvascular ischemic changes. Electronically Signed   By: Macy Mis M.D.   On: 02/04/2022 16:21   CT ANGIO HEAD NECK W WO CM  Result Date: 02/04/2022 CLINICAL DATA:  Neuro deficit, acute, stroke suspected EXAM: CT ANGIOGRAPHY HEAD AND NECK TECHNIQUE: Multidetector CT imaging of the head and neck was performed using the standard protocol during bolus administration of intravenous contrast. Multiplanar CT image reconstructions and MIPs were obtained to evaluate the vascular anatomy. Carotid stenosis measurements (when applicable) are obtained utilizing NASCET criteria, using the distal internal carotid diameter as the denominator. RADIATION DOSE REDUCTION: This exam was performed according to the departmental dose-optimization program which includes automated exposure control, adjustment of the mA and/or kV according to patient size and/or use of iterative reconstruction technique. CONTRAST:  170m OMNIPAQUE IOHEXOL 350 MG/ML SOLN COMPARISON:  None Available. FINDINGS: CT HEAD FINDINGS Brain: No evidence of acute large vascular territory infarction, hemorrhage, hydrocephalus, extra-axial collection or mass lesion/mass effect. Patchy white matter hypodensities, nonspecific but compatible with chronic microvascular ischemic disease. Vascular: Detailed below. Skull: No acute fracture. Sinuses: Visualized sinuses are clear. Orbits: No acute finding. Review of the MIP images confirms the above findings CTA NECK FINDINGS Aortic arch: Aortic atherosclerosis. Great vessel origins are patent with  mild-to-moderate atherosclerotic narrowing. Apparent linear filling defect within the brachiocephalic artery is favored artifactual given artifact this region. Right carotid system: Atherosclerosis at the carotid bifurcation without greater than 50% stenosis. Left carotid system: Atherosclerosis involving the common carotid artery and at the carotid bifurcation without greater than 50% stenosis. Vertebral arteries: Moderate to severe left vertebral artery origin stenosis. Otherwise, vertebral arteries are patent without significant stenosis. Skeleton: Severe multilevel degenerative disc disease. Multilevel facet uncovertebral hypertrophy with varying degrees of neural foraminal stenosis. Other neck: No acute findings. Upper chest: Emphysema. Review of the MIP images confirms the above findings CTA HEAD FINDINGS Anterior circulation: Bilateral intracranial ICAs, MCAs, and ACAs are patent without proximal hemodynamically significant stenosis Posterior circulation: Bilateral intradural vertebral arteries, basilar artery  and bilateral posterior cerebral arteries are patent without proximal hemodynamically significant stenosis. No aneurysm identified. Venous sinuses: As permitted by contrast timing, patent. Review of the MIP images confirms the above findings IMPRESSION: 1. No emergent large vessel occlusion. 2. 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 3. Bilateral carotid bifurcation atherosclerosis without greater than 50% stenosis. 4. Aortic Atherosclerosis (ICD10-I70.0) and Emphysema (ICD10-J43.9). Electronically Signed   By: Margaretha Sheffield M.D.   On: 02/04/2022 14:31     PERTINENT LAB RESULTS: CBC: Recent Labs    02/04/22 1250  WBC 11.6*  HGB 13.5  HCT 39.5  PLT 152   CMET CMP     Component Value Date/Time   NA 137 02/06/2022 0319   NA 140 09/16/2021 1238   K 3.8 02/06/2022 0319   CL 102 02/06/2022 0319   CO2 26 02/06/2022 0319   GLUCOSE 107 (H) 02/06/2022 0319   BUN 13  02/06/2022 0319   BUN 18 09/16/2021 1238   CREATININE 0.58 02/06/2022 0319   CALCIUM 9.1 02/06/2022 0319   PROT 6.4 (L) 02/06/2022 0319   PROT 7.0 08/24/2021 1655   ALBUMIN 3.0 (L) 02/06/2022 0319   ALBUMIN 4.1 08/24/2021 1655   AST 34 02/06/2022 0319   ALT 51 (H) 02/06/2022 0319   ALKPHOS 130 (H) 02/06/2022 0319   BILITOT 1.2 02/06/2022 0319   BILITOT 0.4 08/24/2021 1655   GFRNONAA >60 02/06/2022 0319   GFRAA 111 03/06/2020 1057    GFR Estimated Creatinine Clearance: 49.5 mL/min (by C-G formula based on SCr of 0.58 mg/dL). No results for input(s): "LIPASE", "AMYLASE" in the last 72 hours. No results for input(s): "CKTOTAL", "CKMB", "CKMBINDEX", "TROPONINI" in the last 72 hours. Invalid input(s): "POCBNP" No results for input(s): "DDIMER" in the last 72 hours. Recent Labs    02/04/22 1250  HGBA1C 5.6   Recent Labs    02/05/22 0941  CHOL 133  HDL 36*  LDLCALC 79  TRIG 89  CHOLHDL 3.7   Recent Labs    02/05/22 0049  TSH 0.399   Recent Labs    02/05/22 0049  VITAMINB12 837   Coags: Recent Labs    02/04/22 1302  INR 1.1   Microbiology: Recent Results (from the past 240 hour(s))  Surgical pcr screen     Status: None   Collection Time: 01/28/22 10:23 AM   Specimen: Nasal Mucosa; Nasal Swab  Result Value Ref Range Status   MRSA, PCR NEGATIVE NEGATIVE Final   Staphylococcus aureus NEGATIVE NEGATIVE Final    Comment: (NOTE) The Xpert SA Assay (FDA approved for NASAL specimens in patients 24 years of age and older), is one component of a comprehensive surveillance program. It is not intended to diagnose infection nor to guide or monitor treatment. Performed at [facility], [place] [address] [place], [place] [address]   SARS CORONAVIRUS 2 (TAT 6-24 HRS) Anterior Nasal Swab     Status: None   Collection Time: 01/28/22 11:08 AM   Specimen: Anterior Nasal Swab  Result Value Ref Range Status   SARS Coronavirus 2 NEGATIVE NEGATIVE Final    Comment:  (NOTE) SARS-CoV-2 target nucleic acids are NOT DETECTED.  The SARS-CoV-2 RNA is generally detectable in upper and lower respiratory specimens during the acute phase of infection. Negative results do not preclude SARS-CoV-2 infection, do not rule out co-infections with other pathogens, and should not be used as the sole basis for treatment or other patient management decisions. Negative results must be combined with clinical observations,  patient history, and epidemiological information. The expected result is Negative.  Fact Sheet for Patients: SugarRoll.be  Fact Sheet for Healthcare Providers: https://www.woods-mathews.com/  This test is not yet approved or cleared by the [place] FDA and  has been authorized for detection and/or diagnosis of SARS-CoV-2 by FDA under an Emergency Use Authorization (EUA). This EUA will remain  in effect (meaning this test can be used) for the duration of the COVID-19 declaration under Se ction 564(b)(1) of the Act, 21 U.S.C. section 360bbb-3(b)(1), unless the authorization is terminated or revoked sooner.  Performed at [facility], [place] [address] [place], [place] [address]     FURTHER DISCHARGE INSTRUCTIONS:  Get Medicines reviewed and adjusted: Please take all your medications with you for your next visit with your Primary MD  Laboratory/radiological data: Please request your Primary MD to go over all hospital tests and procedure/radiological results at the follow up, please ask your Primary MD to get all Hospital records sent to his/her office.  In some cases, they will be blood work, cultures and biopsy results pending at the time of your discharge. Please request that your primary care M.D. goes through all the records of your hospital data and follows up on these results.  Also Note the following: If you experience worsening of your admission symptoms, develop shortness of breath, life  threatening emergency, suicidal or homicidal thoughts you must seek medical attention immediately by calling 911 or calling your MD immediately  if symptoms less severe.  You must read complete instructions/literature along with all the possible adverse reactions/side effects for all the Medicines you take and that have been prescribed to you. Take any new Medicines after you have completely understood and accpet all the possible adverse reactions/side effects.   Do not drive when taking Pain medications or sleeping medications (Benzodaizepines)  Do not take more than prescribed Pain, Sleep and Anxiety Medications. It is not advisable to combine anxiety,sleep and pain medications without talking with your primary care practitioner  Special Instructions: If you have smoked or chewed Tobacco  in the last 2 yrs please stop smoking, stop any regular Alcohol  and or any Recreational drug use.  Wear Seat belts while driving.  Please note: You were cared for by a hospitalist during your hospital stay. Once you are discharged, your primary care physician will handle any further medical issues. Please note that NO REFILLS for any discharge medications will be authorized once you are discharged, as it is imperative that you return to your primary care physician (or establish a relationship with a primary care physician if you do not have one) for your post hospital discharge needs so that they can reassess your need for medications and monitor your lab values.  Total Time spent coordinating discharge including counseling, education and face to face time equals greater than 30 minutes.  SignedOren Binet 02/06/2022 10:03 AM

## 2022-02-06 NOTE — Progress Notes (Signed)
SLP Cancellation Note  Patient Details Name: HALAINA VANDUZER MRN: [id number] DOB: 05-May-1951   Cancelled treatment:       Reason Eval/Treat Not Completed: Other (comment) Pt imminently discharging. Family present to pick her up. 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 Recommend outpatient SLP evaluation.  Marisel Tostenson P. Masen Salvas, M.S., [place] Pathologist Acute Rehabilitation Services Pager: [place] 02/06/2022, 11:00 AM

## 2022-02-07 ENCOUNTER — Telehealth: Payer: Self-pay

## 2022-02-07 ENCOUNTER — Inpatient Hospital Stay (HOSPITAL_COMMUNITY): Payer: Medicare HMO

## 2022-02-07 ENCOUNTER — Emergency Department (HOSPITAL_COMMUNITY): Payer: Medicare HMO

## 2022-02-07 ENCOUNTER — Inpatient Hospital Stay (HOSPITAL_COMMUNITY)
Admission: EM | Admit: 2022-02-07 | Discharge: 2022-02-11 | DRG: 100 | Disposition: A | Discharge status: Home / Self Care | Payer: Medicare HMO | Attending: Internal Medicine | Admitting: Internal Medicine

## 2022-02-07 ENCOUNTER — Other Ambulatory Visit: Payer: Self-pay

## 2022-02-07 ENCOUNTER — Encounter (HOSPITAL_COMMUNITY): Payer: Self-pay

## 2022-02-07 DIAGNOSIS — I69319 Unspecified symptoms and signs involving cognitive functions following cerebral infarction: Secondary | ICD-10-CM | POA: Diagnosis not present

## 2022-02-07 DIAGNOSIS — Z743 Need for continuous supervision: Secondary | ICD-10-CM | POA: Diagnosis not present

## 2022-02-07 DIAGNOSIS — G9341 Metabolic encephalopathy: Secondary | ICD-10-CM | POA: Diagnosis not present

## 2022-02-07 DIAGNOSIS — I4819 Other persistent atrial fibrillation: Secondary | ICD-10-CM | POA: Diagnosis not present

## 2022-02-07 DIAGNOSIS — F419 Anxiety disorder, unspecified: Secondary | ICD-10-CM | POA: Diagnosis present

## 2022-02-07 DIAGNOSIS — Z952 Presence of prosthetic heart valve: Secondary | ICD-10-CM | POA: Diagnosis not present

## 2022-02-07 DIAGNOSIS — I5032 Chronic diastolic (congestive) heart failure: Secondary | ICD-10-CM | POA: Diagnosis not present

## 2022-02-07 DIAGNOSIS — Z9049 Acquired absence of other specified parts of digestive tract: Secondary | ICD-10-CM | POA: Diagnosis not present

## 2022-02-07 DIAGNOSIS — Z87891 Personal history of nicotine dependence: Secondary | ICD-10-CM

## 2022-02-07 DIAGNOSIS — R569 Unspecified convulsions: Secondary | ICD-10-CM | POA: Diagnosis not present

## 2022-02-07 DIAGNOSIS — G4089 Other seizures: Principal | ICD-10-CM | POA: Diagnosis present

## 2022-02-07 DIAGNOSIS — I350 Nonrheumatic aortic (valve) stenosis: Secondary | ICD-10-CM

## 2022-02-07 DIAGNOSIS — J9601 Acute respiratory failure with hypoxia: Secondary | ICD-10-CM | POA: Diagnosis not present

## 2022-02-07 DIAGNOSIS — I4891 Unspecified atrial fibrillation: Secondary | ICD-10-CM | POA: Diagnosis not present

## 2022-02-07 DIAGNOSIS — M8580 Other specified disorders of bone density and structure, unspecified site: Secondary | ICD-10-CM | POA: Diagnosis not present

## 2022-02-07 DIAGNOSIS — R29818 Other symptoms and signs involving the nervous system: Secondary | ICD-10-CM | POA: Diagnosis not present

## 2022-02-07 DIAGNOSIS — Z79899 Other long term (current) drug therapy: Secondary | ICD-10-CM | POA: Diagnosis not present

## 2022-02-07 DIAGNOSIS — H5461 Unqualified visual loss, right eye, normal vision left eye: Secondary | ICD-10-CM | POA: Diagnosis not present

## 2022-02-07 DIAGNOSIS — I513 Intracardiac thrombosis, not elsewhere classified: Secondary | ICD-10-CM | POA: Diagnosis not present

## 2022-02-07 DIAGNOSIS — I10 Essential (primary) hypertension: Secondary | ICD-10-CM | POA: Diagnosis present

## 2022-02-07 DIAGNOSIS — E039 Hypothyroidism, unspecified: Secondary | ICD-10-CM | POA: Diagnosis present

## 2022-02-07 DIAGNOSIS — G9340 Encephalopathy, unspecified: Principal | ICD-10-CM

## 2022-02-07 DIAGNOSIS — F32A Depression, unspecified: Secondary | ICD-10-CM

## 2022-02-07 DIAGNOSIS — H3530 Unspecified macular degeneration: Secondary | ICD-10-CM | POA: Diagnosis present

## 2022-02-07 DIAGNOSIS — Z818 Family history of other mental and behavioral disorders: Secondary | ICD-10-CM

## 2022-02-07 DIAGNOSIS — Z79890 Hormone replacement therapy (postmenopausal): Secondary | ICD-10-CM

## 2022-02-07 DIAGNOSIS — E871 Hypo-osmolality and hyponatremia: Secondary | ICD-10-CM | POA: Diagnosis not present

## 2022-02-07 DIAGNOSIS — Z20822 Contact with and (suspected) exposure to covid-19: Secondary | ICD-10-CM | POA: Diagnosis not present

## 2022-02-07 DIAGNOSIS — I499 Cardiac arrhythmia, unspecified: Secondary | ICD-10-CM | POA: Diagnosis not present

## 2022-02-07 DIAGNOSIS — I4811 Longstanding persistent atrial fibrillation: Secondary | ICD-10-CM

## 2022-02-07 DIAGNOSIS — E785 Hyperlipidemia, unspecified: Secondary | ICD-10-CM | POA: Diagnosis not present

## 2022-02-07 DIAGNOSIS — J439 Emphysema, unspecified: Secondary | ICD-10-CM | POA: Diagnosis present

## 2022-02-07 DIAGNOSIS — Z8541 Personal history of malignant neoplasm of cervix uteri: Secondary | ICD-10-CM

## 2022-02-07 DIAGNOSIS — I6782 Cerebral ischemia: Secondary | ICD-10-CM | POA: Diagnosis not present

## 2022-02-07 DIAGNOSIS — R404 Transient alteration of awareness: Secondary | ICD-10-CM | POA: Diagnosis not present

## 2022-02-07 DIAGNOSIS — I110 Hypertensive heart disease with heart failure: Secondary | ICD-10-CM | POA: Diagnosis present

## 2022-02-07 DIAGNOSIS — Z7901 Long term (current) use of anticoagulants: Secondary | ICD-10-CM

## 2022-02-07 DIAGNOSIS — R69 Illness, unspecified: Secondary | ICD-10-CM | POA: Diagnosis not present

## 2022-02-07 LAB — PROTIME-INR
INR: 1.4 — ABNORMAL HIGH (ref 0.8–1.2)
Prothrombin Time: 17 seconds — ABNORMAL HIGH (ref 11.4–15.2)

## 2022-02-07 LAB — COMPREHENSIVE METABOLIC PANEL
ALT: 61 U/L — ABNORMAL HIGH (ref 0–44)
AST: 48 U/L — ABNORMAL HIGH (ref 15–41)
Albumin: 3.5 g/dL (ref 3.5–5.0)
Alkaline Phosphatase: 138 U/L — ABNORMAL HIGH (ref 38–126)
Anion gap: 8 (ref 5–15)
BUN: 24 mg/dL — ABNORMAL HIGH (ref 8–23)
CO2: 25 mmol/L (ref 22–32)
Calcium: 8.9 mg/dL (ref 8.9–10.3)
Chloride: 101 mmol/L (ref 98–111)
Creatinine, Ser: 0.65 mg/dL (ref 0.44–1.00)
GFR, Estimated: >60 mL/min (ref >60)
Glucose, Bld: 151 mg/dL — ABNORMAL HIGH (ref 70–99)
Potassium: 3.5 mmol/L (ref 3.5–5.1)
Sodium: 134 mmol/L — ABNORMAL LOW (ref 135–145)
Total Bilirubin: 1.2 mg/dL (ref 0.3–1.2)
Total Protein: 7.3 g/dL (ref 6.5–8.1)

## 2022-02-07 LAB — RAPID URINE DRUG SCREEN, HOSP PERFORMED
Amphetamines: NOT DETECTED
Barbiturates: NOT DETECTED
Benzodiazepines: NOT DETECTED
Cocaine: NOT DETECTED
Opiates: NOT DETECTED
Tetrahydrocannabinol: POSITIVE — AB

## 2022-02-07 LAB — DIFFERENTIAL
Abs Immature Granulocytes: 0.05 10*3/uL (ref 0.00–0.07)
Basophils Absolute: 0.1 10*3/uL (ref 0.0–0.1)
Basophils Relative: 1 %
Eosinophils Absolute: 0.5 10*3/uL (ref 0.0–0.5)
Eosinophils Relative: 4 %
Immature Granulocytes: 0 %
Lymphocytes Relative: 23 %
Lymphs Abs: 2.9 10*3/uL (ref 0.7–4.0)
Monocytes Absolute: 1 10*3/uL (ref 0.1–1.0)
Monocytes Relative: 8 %
Neutro Abs: 8.1 10*3/uL — ABNORMAL HIGH (ref 1.7–7.7)
Neutrophils Relative %: 64 %

## 2022-02-07 LAB — CBC
HCT: 44.5 % (ref 36.0–46.0)
Hemoglobin: 15.2 g/dL — ABNORMAL HIGH (ref 12.0–15.0)
MCH: 32.2 pg (ref 26.0–34.0)
MCHC: 34.2 g/dL (ref 30.0–36.0)
MCV: 94.3 fL (ref 80.0–100.0)
Platelets: 186 10*3/uL (ref 150–400)
RBC: 4.72 MIL/uL (ref 3.87–5.11)
RDW: 13.1 % (ref 11.5–15.5)
WBC: 12.5 10*3/uL — ABNORMAL HIGH (ref 4.0–10.5)
nRBC: 0 % (ref 0.0–0.2)

## 2022-02-07 LAB — URINALYSIS, ROUTINE W REFLEX MICROSCOPIC
Bilirubin Urine: NEGATIVE
Glucose, UA: NEGATIVE mg/dL
Hgb urine dipstick: NEGATIVE
Ketones, ur: 5 mg/dL — AB
Nitrite: NEGATIVE
Protein, ur: 30 mg/dL — AB
Specific Gravity, Urine: >1.046 — ABNORMAL HIGH (ref 1.005–1.030)
pH: 6 (ref 5.0–8.0)

## 2022-02-07 LAB — RESP PANEL BY RT-PCR (FLU A&B, COVID) ARPGX2
Influenza A by PCR: NEGATIVE
Influenza B by PCR: NEGATIVE
SARS Coronavirus 2 by RT PCR: NEGATIVE

## 2022-02-07 LAB — APTT: aPTT: 32 seconds (ref 24–36)

## 2022-02-07 LAB — ETHANOL: Alcohol, Ethyl (B): <10 mg/dL (ref <10)

## 2022-02-07 LAB — PROCALCITONIN: Procalcitonin: <0.1 ng/mL

## 2022-02-07 LAB — MRSA NEXT GEN BY PCR, NASAL: MRSA by PCR Next Gen: NOT DETECTED

## 2022-02-07 IMAGING — DX DG CHEST 1V PORT
1 series · 1 of 1 positions shown · non-contrast
Comparison: Chest radiograph dated [DATE].

CLINICAL DATA: Seizure.

EXAM:
PORTABLE CHEST 1 VIEW

[chest ap]
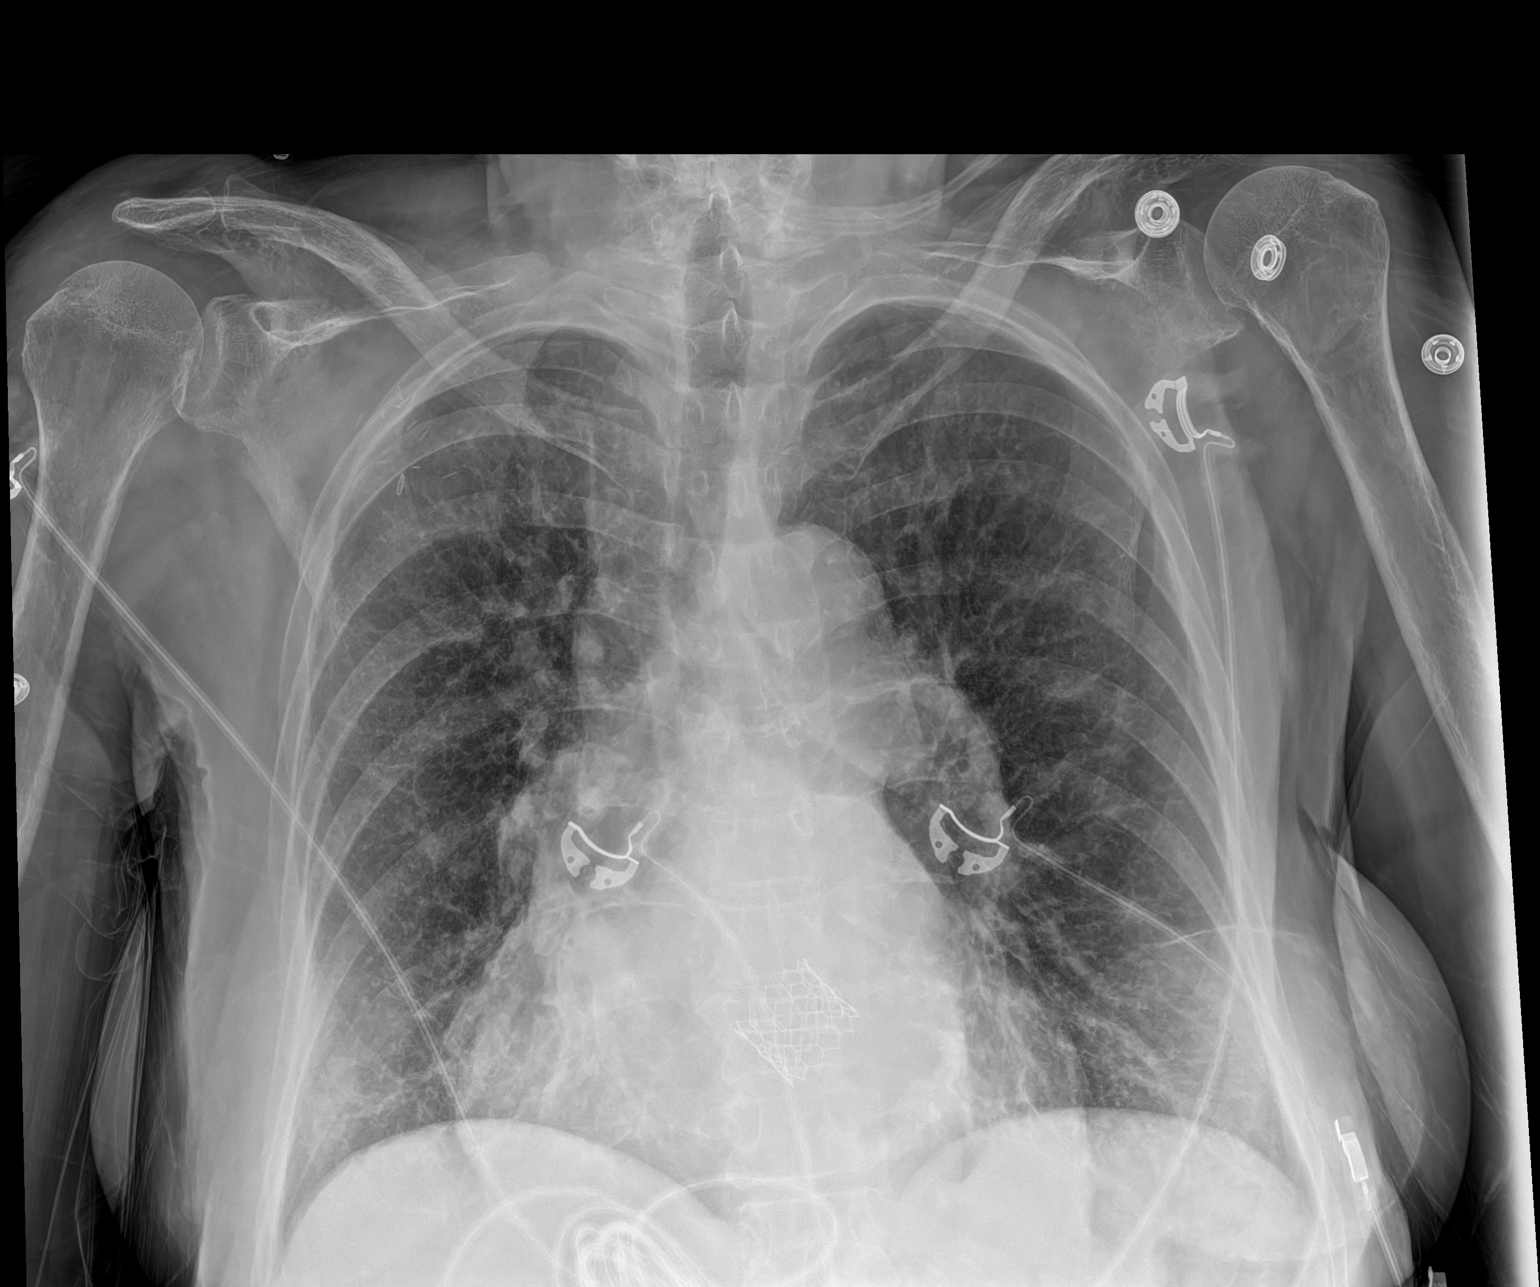

[1 of 1 positions shown; findings below may reference images not displayed]

FINDINGS: No focal consolidation, pleural effusion, pneumothorax. Chronic
interstitial coarsening. There is mild cardiomegaly. Aortic valve
repair and calcification of the mitral annulus. Osteopenia with
degenerative changes of the spine. No acute osseous pathology.
IMPRESSION: No acute cardiopulmonary process.

## 2022-02-07 IMAGING — CT CT HEAD CODE STROKE
3 of 4 series · 15 of 47 positions shown, 18 images · non-contrast
Comparison: None Available.

CLINICAL DATA: Code stroke.  Seizure



[Series 3: head w o · axial · 0.40mm/px · z∈[-2,+124]mm · 9 of 31 slices shown, 12 images]
[im 3/31  brain]
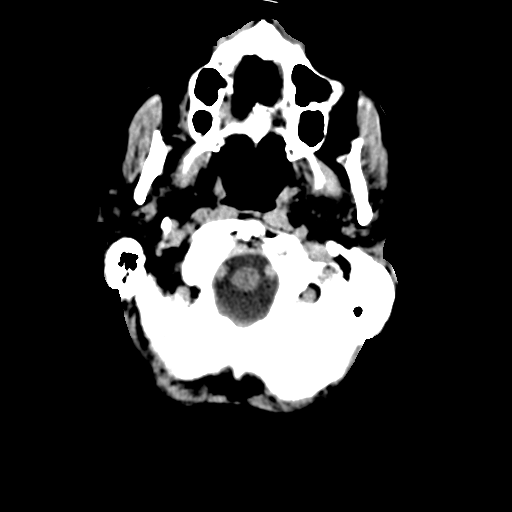
[im 3/31  bone]
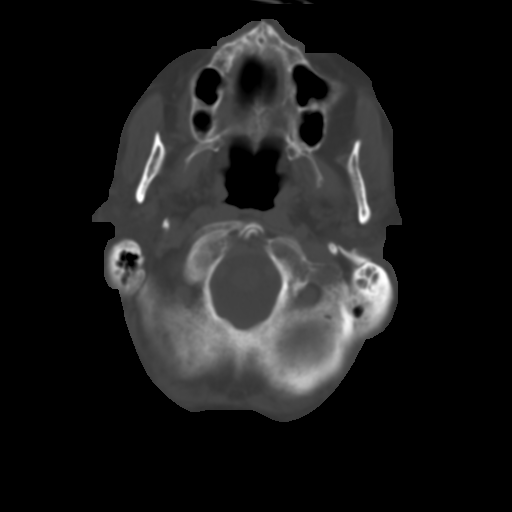
[im 7/31  brain]
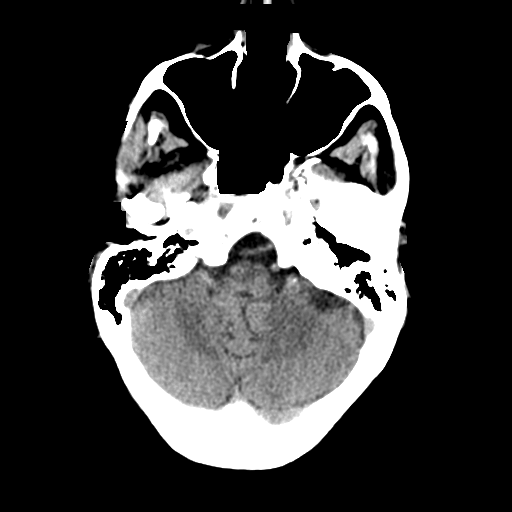
[im 9/31  brain]
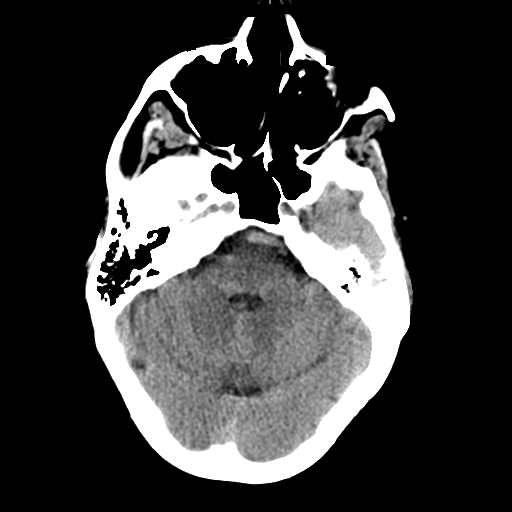
[im 13/31  brain]
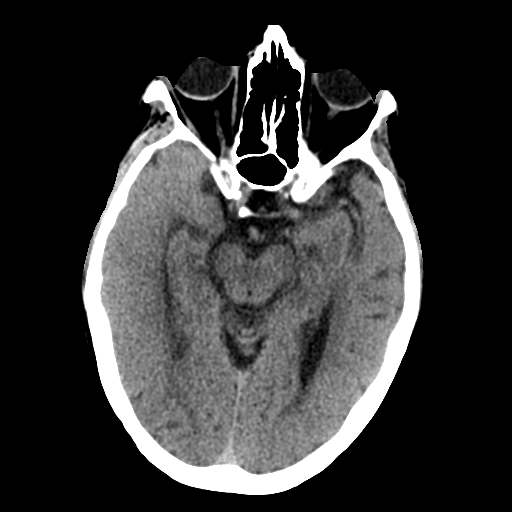
[im 16/31  brain]
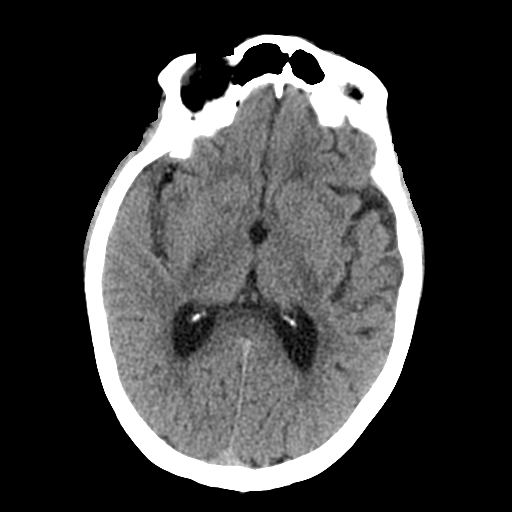
[im 16/31  bone]
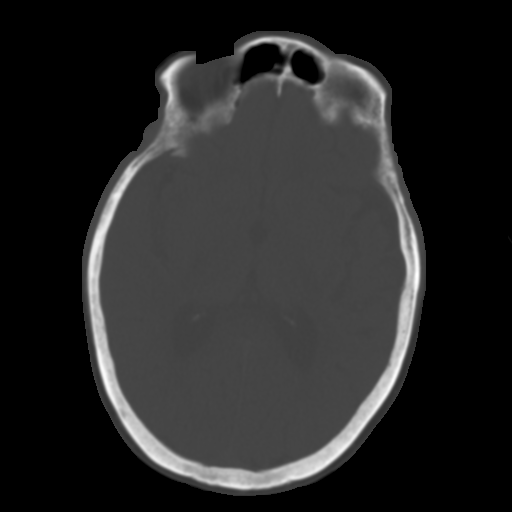
[im 18/31  brain]
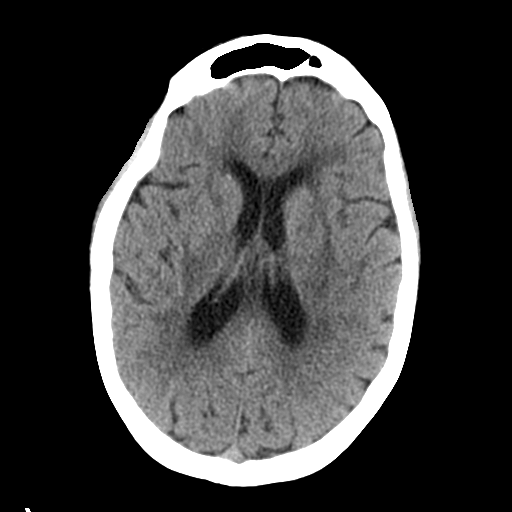
[im 22/31  brain]
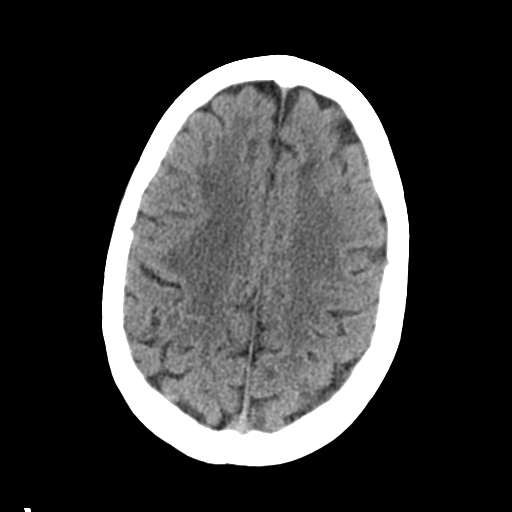
[im 24/31  brain]
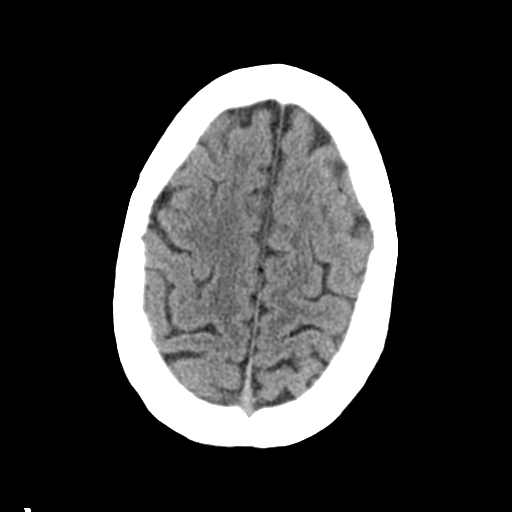
[im 28/31  brain]
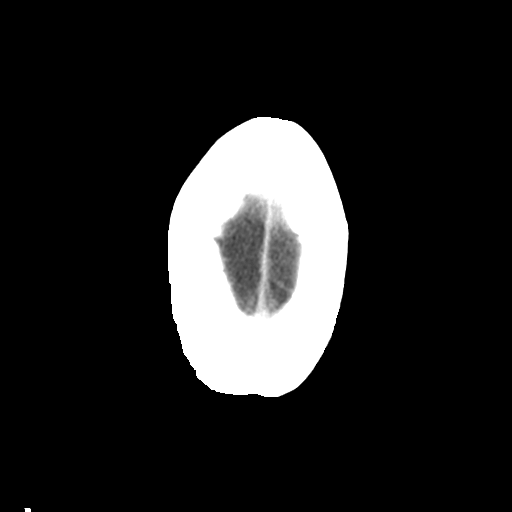
[im 28/31  bone]
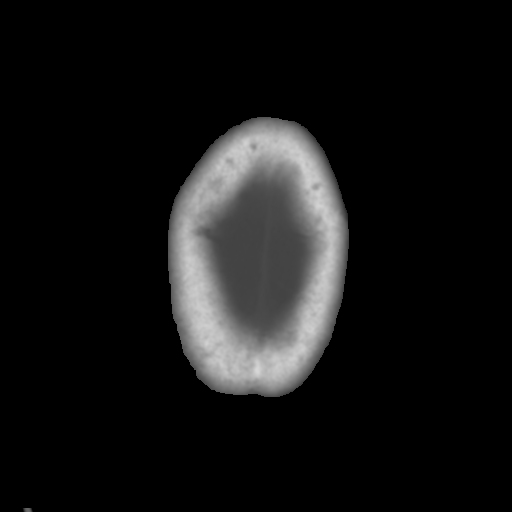

[Series 5: coronal soft · coronal · 0.34mm/px · 3 of 73 slices shown]
[im 25/73  brain]
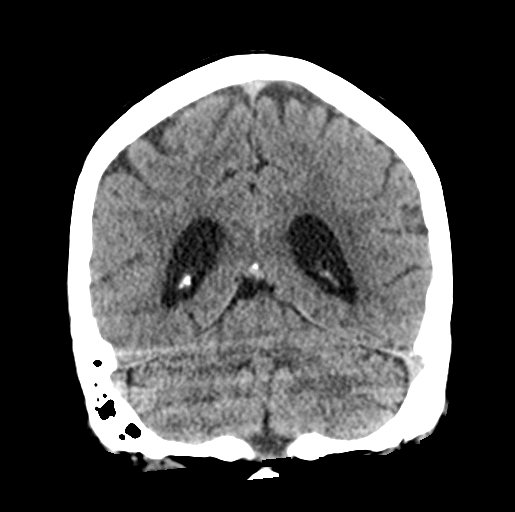
[im 33/73  brain]
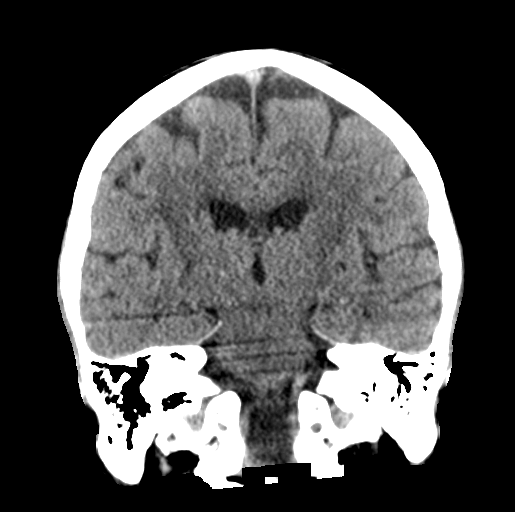
[im 41/73  brain]
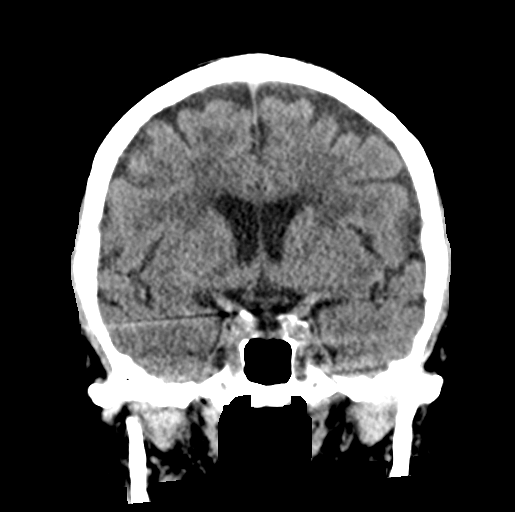

[Series 6: sagittal soft · sagittal · 0.32mm/px · 3 of 51 slices shown]
[im 17/51  brain]
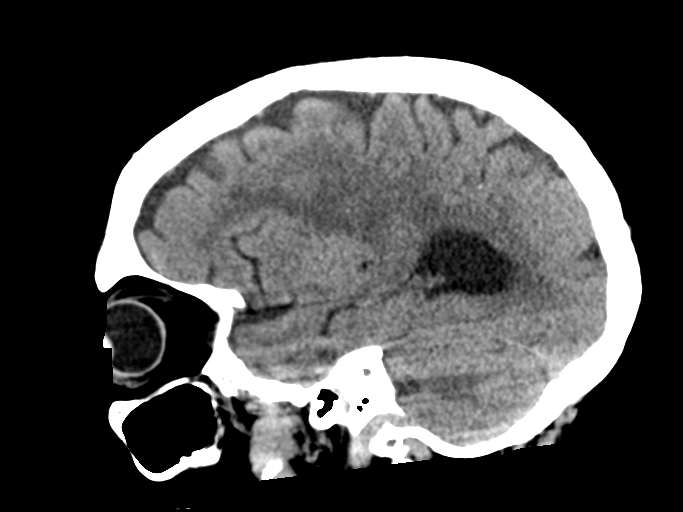
[im 26/51  brain]
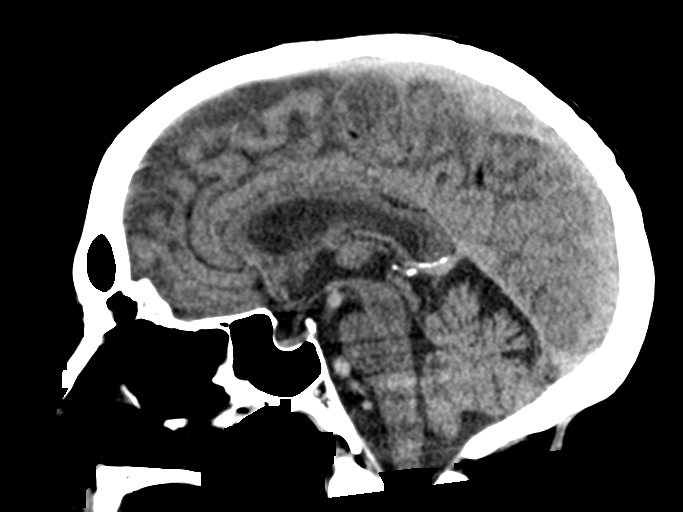
[im 34/51  brain]
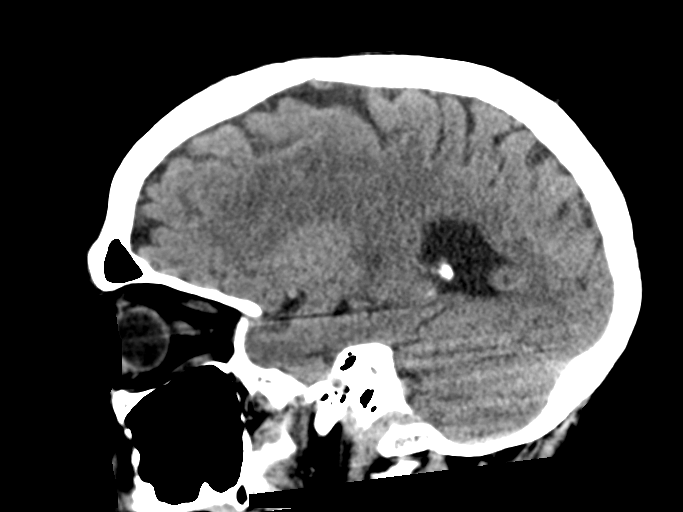

[15 of 47 positions shown; findings below may reference images not displayed]

FINDINGS: Brain: There is no mass, hemorrhage or extra-axial collection. The
size and configuration of the ventricles and extra-axial CSF spaces
are normal. There is hypoattenuation of the periventricular white
matter, most commonly indicating chronic ischemic microangiopathy.

Vascular: No abnormal hyperdensity of the major intracranial
arteries or dural venous sinuses. No intracranial atherosclerosis.

Skull: The visualized skull base, calvarium and extracranial soft
tissues are normal.

Sinuses/Orbits: No fluid levels or advanced mucosal thickening of
the visualized paranasal sinuses. No mastoid or middle ear effusion.
The orbits are normal.

ASPECTS (Alberta Stroke Program Early CT Score)

- Ganglionic level infarction (caudate, lentiform nuclei, internal
capsule, insula, M1-M3 cortex): 7

- Supraganglionic infarction (M4-M6 cortex): 3

Total score (0-10 with 10 being normal): 10
IMPRESSION: 1. No acute intracranial abnormality.
2. ASPECTS is 10.

These results were called by telephone at the time of interpretation
on [DATE] at [DATE] to provider PAULUS N , who verbally
acknowledged these results.

## 2022-02-07 IMAGING — MR MR HEAD W/O CM
8 of 10 series · 38 of 48 positions shown · non-contrast
Comparison: CT head [DATE].

CLINICAL DATA: Neuro deficit, acute, stroke suspected

EXAM:
MRI HEAD WITHOUT CONTRAST
TECHNIQUE: Multiplanar, multiecho pulse sequences of the brain and surrounding
structures were obtained without intravenous contrast.

[Series 3: DWI · axial · 3.0mm · 1.09mm/px · z∈[-93,+53]mm · 9 of 100 slices shown (1 of 4)]
[im 1/100]
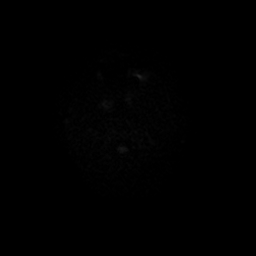
[im 17/100]
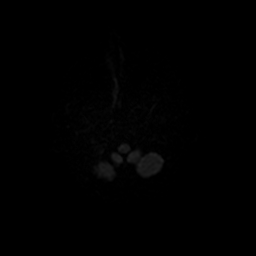
[im 34/100]
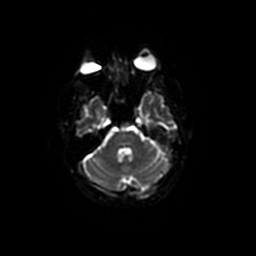
[im 42/100]
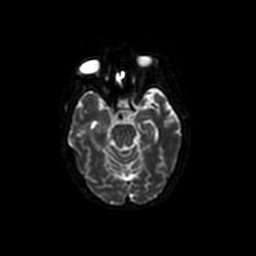
[im 50/100]
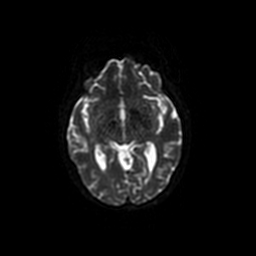
[im 58/100]
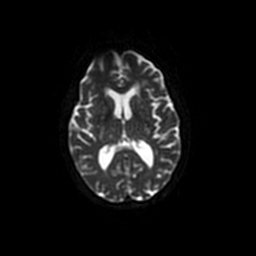
[im 67/100]
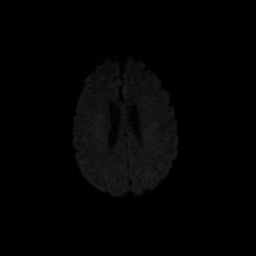
[im 83/100]
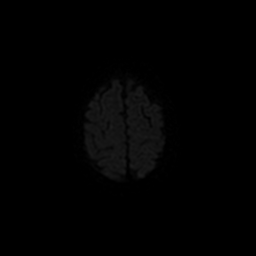
[im 100/100]
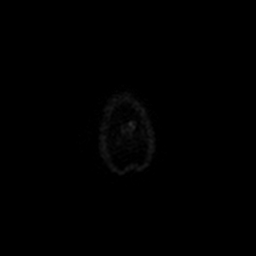

[Series 4: DWI · coronal · 5.0mm · 1.09mm/px · 9 of 74 slices shown (2 of 4)]
[im 1/74]
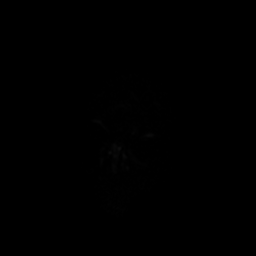
[im 10/74]
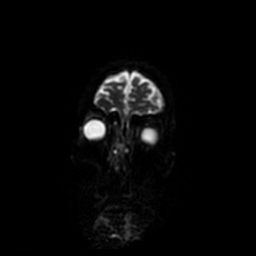
[im 19/74]
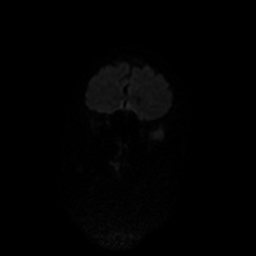
[im 28/74]
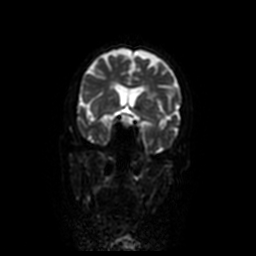
[im 37/74]
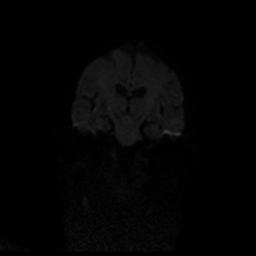
[im 46/74]
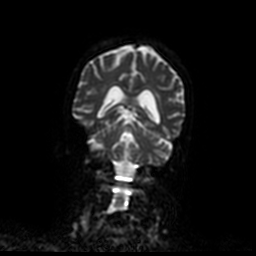
[im 55/74]
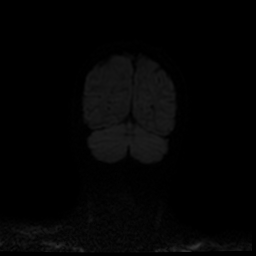
[im 64/74]
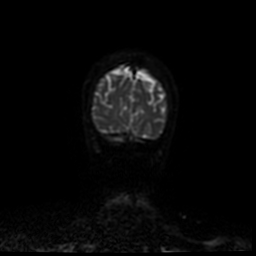
[im 74/74]
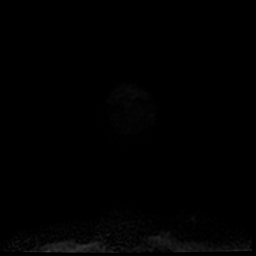

[Series 5: T1 · sagittal · 5.0mm · 0.47mm/px · 3 of 25 slices shown]
[im 1/25]
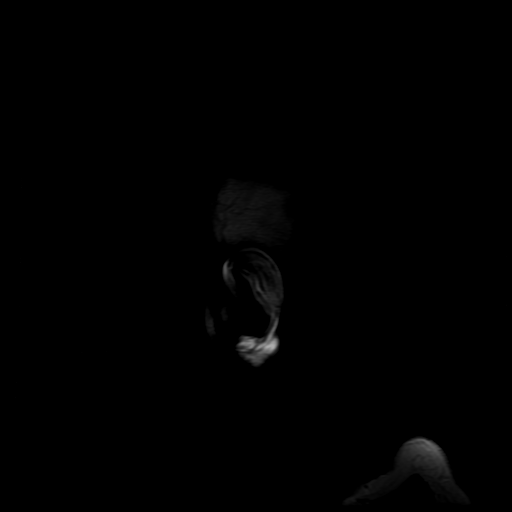
[im 13/25]
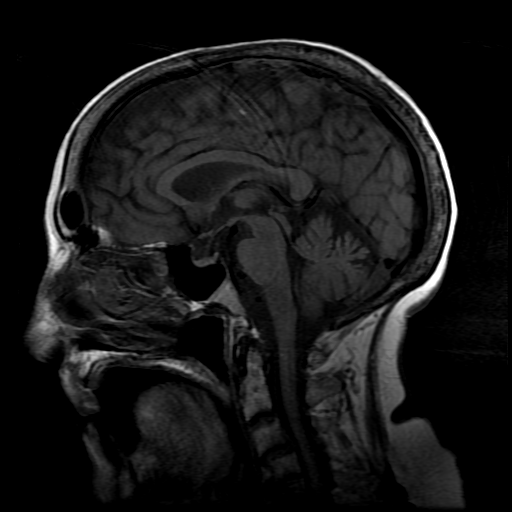
[im 25/25]
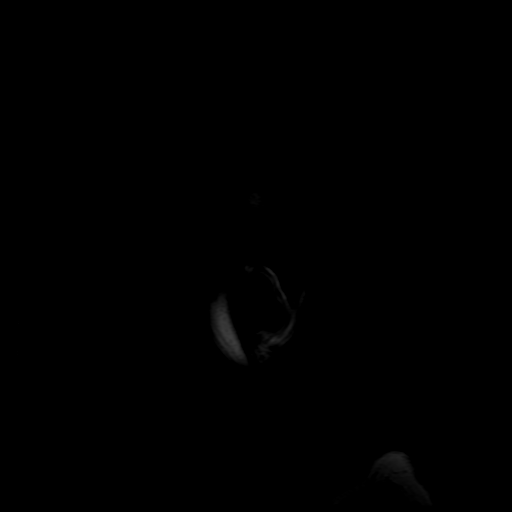

[Series 6: T2 · axial · 5.0mm · 0.43mm/px · z∈[-90,+59]mm · 3 of 26 slices shown]
[im 1/26]
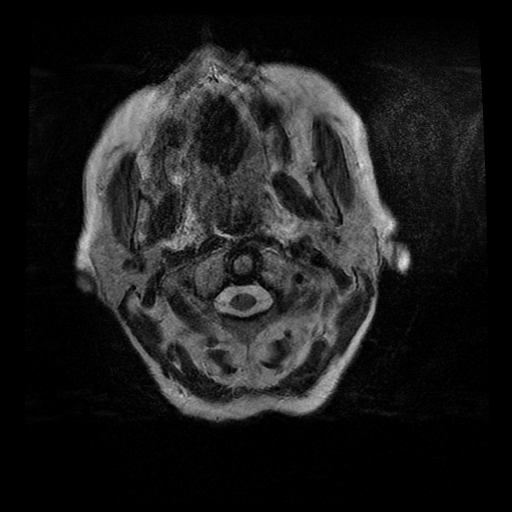
[im 13/26]
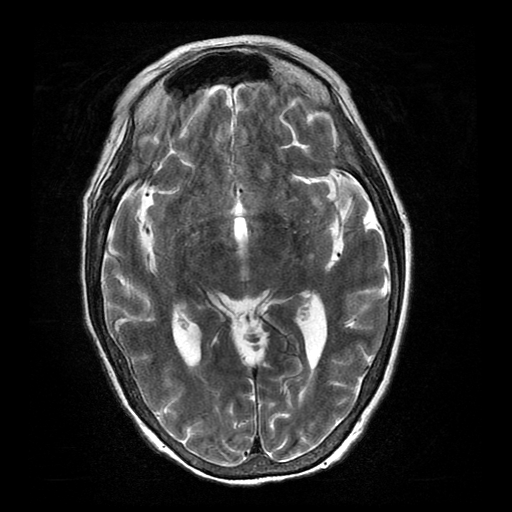
[im 26/26]
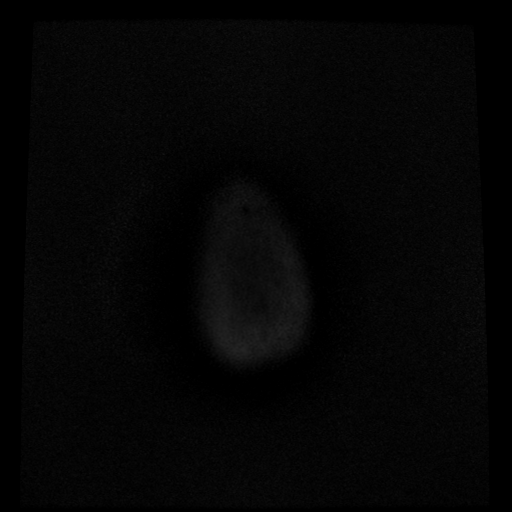

[Series 7: loc · axial · 6.0mm · 0.59mm/px · 1 of 19 slices shown]
[im 1/19]
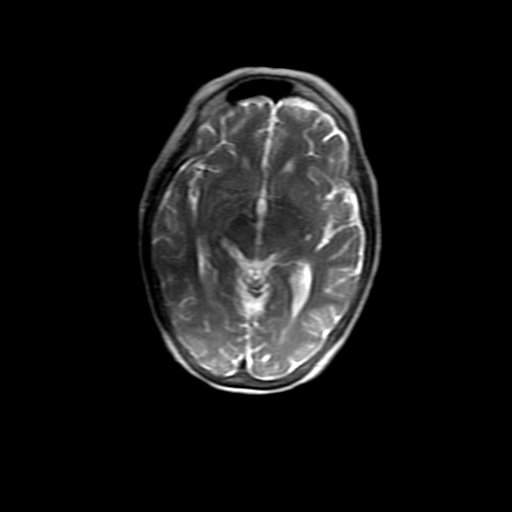

[Series 8: FLAIR · axial · 3.0mm · 0.43mm/px · z∈[-106,+42]mm · 3 of 26 slices shown]
[im 1/26]
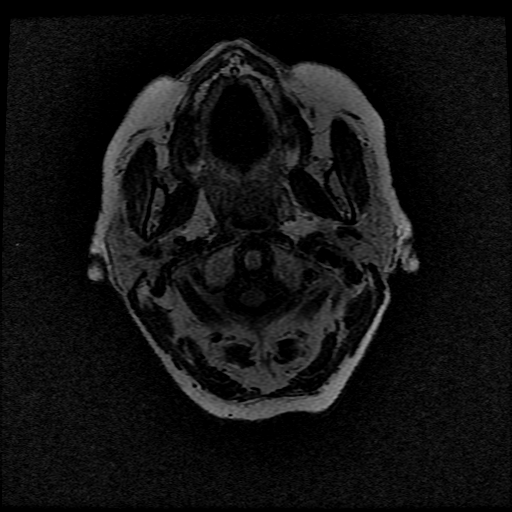
[im 13/26]
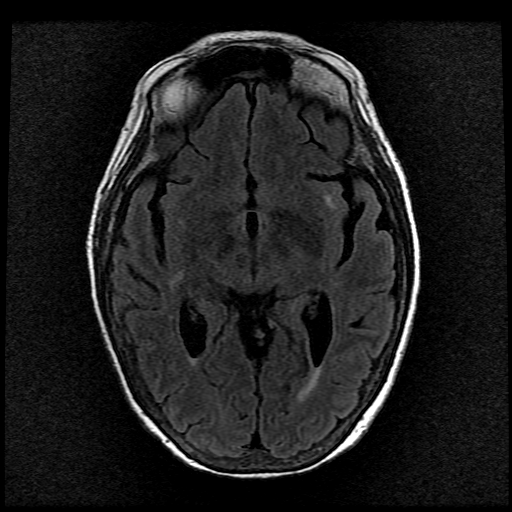
[im 26/26]
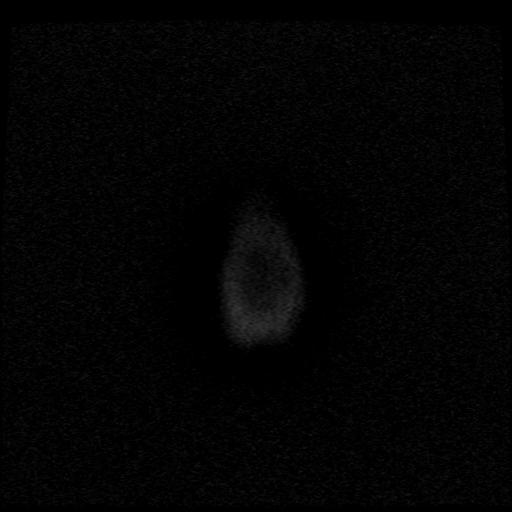

[Series 300: DWI · axial · 3.0mm · 1.09mm/px · z∈[-93,+53]mm · 6 of 50 slices shown (3 of 4)]
[im 1/50]
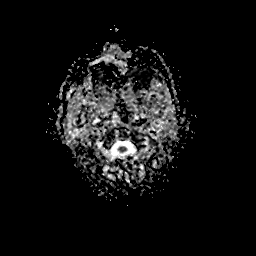
[im 10/50]
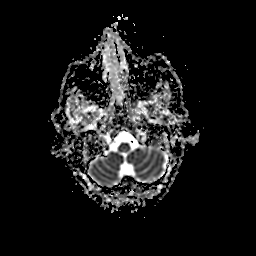
[im 20/50]
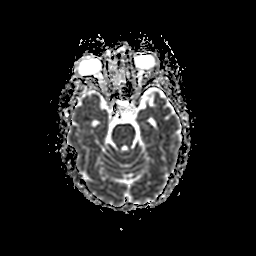
[im 30/50]
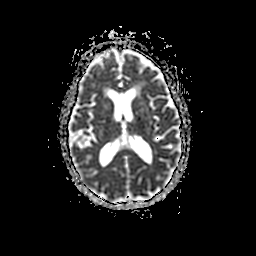
[im 40/50]
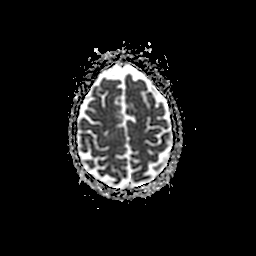
[im 50/50]
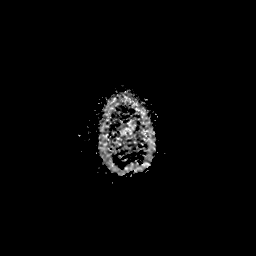

[Series 400: DWI · coronal · 5.0mm · 1.09mm/px · 4 of 36 slices shown (4 of 4)]
[im 1/36]
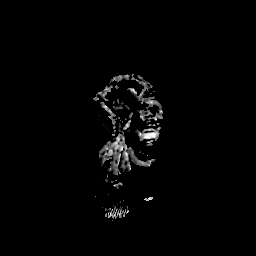
[im 12/36]
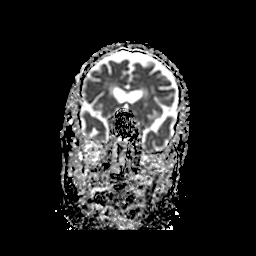
[im 24/36]
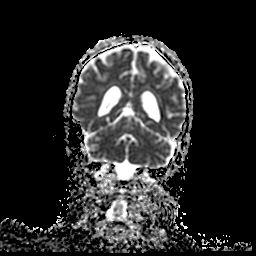
[im 36/36]
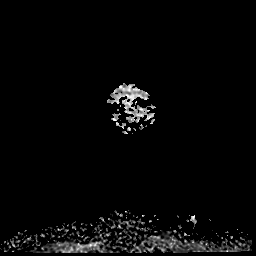

[38 of 48 positions shown; findings below may reference images not displayed]

FINDINGS: Significantly motion limited study.

Brain: Punctate foci of apparent restricted diffusion in the left
frontal (series 3, image 30) and right occipital (series 3, image
23) lobes and right cerebellum (series 3, image 15). No evidence of
acute hemorrhage, mass lesion, midline shift, hydrocephalus, or
extra-axial fluid collection. Patchy T2/FLAIR hyperintensities in
white matter, nonspecific but compatible with moderate chronic
microvascular ischemic disease.

Vascular: Major arterial flow voids are maintained the skull base.

Skull and upper cervical spine: Normal marrow signal.

Sinuses/Orbits: Clear sinuses.  No acute orbital findings.

Other: No mastoid effusions.
IMPRESSION: 1. Significantly motion limited study. Punctate foci of apparent
restricted diffusion in the left fronta and right occipital lobes
and right cerebellum could represent punctate acute infarcts or
artifact.
2. Moderate chronic microvascular ischemic disease.

## 2022-02-07 MED ORDER — LEVOTHYROXINE SODIUM 88 MCG PO TABS
88.0000 ug | ORAL_TABLET | Freq: Every day | ORAL | Status: DC
  Administered 2022-02-08 – 2022-02-11 (×4): 88 ug via ORAL
  Filled 2022-02-07 (×5): qty 1

## 2022-02-07 MED ORDER — VENLAFAXINE HCL ER 75 MG PO CP24: 75.0000 mg | ORAL_CAPSULE | Freq: Every day | ORAL | Status: DC

## 2022-02-07 MED ORDER — UMECLIDINIUM-VILANTEROL 62.5-25 MCG/ACT IN AEPB
1.0000 | INHALATION_SPRAY | Freq: Every day | RESPIRATORY_TRACT | Status: DC
  Filled 2022-02-07: qty 14

## 2022-02-07 MED ORDER — ONDANSETRON HCL 4 MG/2ML IJ SOLN: 4.0000 mg | Freq: Four times a day (QID) | INTRAMUSCULAR | Status: DC | PRN

## 2022-02-07 MED ORDER — LORAZEPAM 2 MG/ML IJ SOLN
INTRAMUSCULAR | Status: AC
  Administered 2022-02-07: 2 mg via INTRAVENOUS
  Filled 2022-02-07: qty 1

## 2022-02-07 MED ORDER — CHLORHEXIDINE GLUCONATE CLOTH 2 % EX PADS
6.0000 | MEDICATED_PAD | Freq: Every day | CUTANEOUS | Status: DC
  Administered 2022-02-07 – 2022-02-11 (×5): 6 via TOPICAL

## 2022-02-07 MED ORDER — ACETAMINOPHEN 160 MG/5ML PO SOLN: 650.0000 mg | ORAL | Status: DC | PRN

## 2022-02-07 MED ORDER — POLYETHYLENE GLYCOL 3350 17 G PO PACK
17.0000 g | PACK | Freq: Every day | ORAL | Status: DC | PRN
Start: 2022-02-07 — End: 2022-02-11
  Administered 2022-02-10: 17 g via ORAL
  Filled 2022-02-07: qty 1

## 2022-02-07 MED ORDER — LEVETIRACETAM IN NACL 1000 MG/100ML IV SOLN
INTRAVENOUS | Status: AC
  Administered 2022-02-07: 1000 mg via INTRAVENOUS
  Filled 2022-02-07: qty 100

## 2022-02-07 MED ORDER — HYDRALAZINE HCL 20 MG/ML IJ SOLN: 10.0000 mg | INTRAMUSCULAR | Status: DC | PRN

## 2022-02-07 MED ORDER — DOCUSATE SODIUM 100 MG PO CAPS: 100.0000 mg | ORAL_CAPSULE | Freq: Two times a day (BID) | ORAL | Status: DC | PRN

## 2022-02-07 MED ORDER — LEVETIRACETAM IN NACL 1000 MG/100ML IV SOLN
1000.0000 mg | Freq: Once | INTRAVENOUS | Status: AC
  Filled 2022-02-07: qty 100

## 2022-02-07 MED ORDER — LORAZEPAM 2 MG/ML IJ SOLN
INTRAMUSCULAR | Status: AC
  Filled 2022-02-07: qty 1

## 2022-02-07 MED ORDER — LORAZEPAM 2 MG/ML IJ SOLN: 2.0000 mg | INTRAMUSCULAR | Status: DC | PRN

## 2022-02-07 MED ORDER — SODIUM CHLORIDE 0.9 % IV SOLN
2000.0000 mg | Freq: Once | INTRAVENOUS | Status: DC
  Filled 2022-02-07: qty 20

## 2022-02-07 MED ORDER — ALBUTEROL SULFATE (2.5 MG/3ML) 0.083% IN NEBU: 3.0000 mL | INHALATION_SOLUTION | RESPIRATORY_TRACT | Status: DC | PRN

## 2022-02-07 MED ORDER — LEVETIRACETAM IN NACL 1000 MG/100ML IV SOLN
INTRAVENOUS | Status: AC
  Filled 2022-02-07: qty 100

## 2022-02-07 MED ORDER — ATORVASTATIN CALCIUM 40 MG PO TABS
40.0000 mg | ORAL_TABLET | Freq: Every day | ORAL | Status: DC
  Administered 2022-02-08 – 2022-02-11 (×4): 40 mg via ORAL
  Filled 2022-02-07 (×4): qty 1

## 2022-02-07 MED ORDER — ALBUTEROL SULFATE (2.5 MG/3ML) 0.083% IN NEBU
3.0000 mL | INHALATION_SOLUTION | Freq: Four times a day (QID) | RESPIRATORY_TRACT | Status: DC | PRN
Start: 2022-02-07 — End: 2022-02-07

## 2022-02-07 MED ORDER — SENNOSIDES-DOCUSATE SODIUM 8.6-50 MG PO TABS: 1.0000 | ORAL_TABLET | Freq: Every evening | ORAL | Status: DC | PRN

## 2022-02-07 MED ORDER — IPRATROPIUM BROMIDE 0.02 % IN SOLN
0.5000 mg | Freq: Four times a day (QID) | RESPIRATORY_TRACT | Status: DC
  Administered 2022-02-07 – 2022-02-08 (×3): 0.5 mg via RESPIRATORY_TRACT
  Filled 2022-02-07 (×4): qty 2.5

## 2022-02-07 MED ORDER — LACTATED RINGERS IV SOLN: INTRAVENOUS | Status: DC

## 2022-02-07 MED ORDER — LEVETIRACETAM IN NACL 1000 MG/100ML IV SOLN
1000.0000 mg | Freq: Two times a day (BID) | INTRAVENOUS | Status: DC
  Administered 2022-02-07 – 2022-02-08 (×2): 1000 mg via INTRAVENOUS
  Filled 2022-02-07 (×2): qty 100

## 2022-02-07 MED ORDER — APIXABAN 5 MG PO TABS
5.0000 mg | ORAL_TABLET | Freq: Two times a day (BID) | ORAL | Status: DC
  Administered 2022-02-08 – 2022-02-11 (×8): 5 mg via ORAL
  Filled 2022-02-07 (×8): qty 1

## 2022-02-07 MED ORDER — ACETAMINOPHEN 650 MG RE SUPP: 650.0000 mg | RECTAL | Status: DC | PRN

## 2022-02-07 MED ORDER — TRAZODONE HCL 50 MG PO TABS
50.0000 mg | ORAL_TABLET | Freq: Every evening | ORAL | Status: DC | PRN
Start: 2022-02-07 — End: 2022-02-07

## 2022-02-07 MED ORDER — LORAZEPAM 2 MG/ML IJ SOLN
1.0000 mg | Freq: Once | INTRAMUSCULAR | Status: AC
  Administered 2022-02-07: 1 mg via INTRAVENOUS
  Filled 2022-02-07: qty 1

## 2022-02-07 MED ORDER — ACETAMINOPHEN 325 MG PO TABS
650.0000 mg | ORAL_TABLET | ORAL | Status: DC | PRN
  Administered 2022-02-10: 650 mg via ORAL
  Filled 2022-02-07: qty 2

## 2022-02-07 MED ORDER — LORAZEPAM 2 MG/ML IJ SOLN
1.0000 mg | Freq: Once | INTRAMUSCULAR | Status: AC
  Administered 2022-02-07: 1 mg via INTRAVENOUS

## 2022-02-07 MED ORDER — SODIUM CHLORIDE 0.9 % IV BOLUS
1000.0000 mL | Freq: Once | INTRAVENOUS | Status: AC
  Administered 2022-02-07: 1000 mL via INTRAVENOUS

## 2022-02-07 MED ORDER — LORAZEPAM 2 MG/ML IJ SOLN: 2.0000 mg | Freq: Once | INTRAMUSCULAR | Status: AC

## 2022-02-07 MED ORDER — STROKE: EARLY STAGES OF RECOVERY BOOK
Freq: Once | Status: AC
Start: 2022-02-07 — End: 2022-02-07
  Filled 2022-02-07: qty 1

## 2022-02-07 MED ORDER — IOHEXOL 350 MG/ML SOLN
100.0000 mL | Freq: Once | INTRAVENOUS | Status: AC | PRN
  Administered 2022-02-07: 100 mL via INTRAVENOUS

## 2022-02-07 MED ORDER — LEVETIRACETAM IN NACL 1000 MG/100ML IV SOLN
1000.0000 mg | Freq: Once | INTRAVENOUS | Status: AC
  Administered 2022-02-07: 1000 mg via INTRAVENOUS

## 2022-02-07 NOTE — Assessment & Plan Note (Signed)
-  Continue Eliquis -Eliquis recently held for TAVR -known left atrial clot

## 2022-02-07 NOTE — Progress Notes (Signed)
[place] beeper time 1317 exam started 1318 exam finished 1318 images sent to soc 1322 exam completed in epic 1320 [facility] called

## 2022-02-07 NOTE — H&P (Signed)
NAME:  Tammie Martin, MRN:  [id number], DOB:  06-09-1951, LOS: 0 ADMISSION DATE:  02/07/2022, CONSULTATION DATE:  02/07/22 REFERRING MD:  Sedonia Small - [facility] CHIEF COMPLAINT:  AMS   History of Present Illness:  Tammie Martin is a 71 y.o. 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  This was felt to be embolic in the setting of left atrial appendage thrombus that was seen on intraoperative TEE on 02/01/2022.  It was suspected that she may have developed a thrombus while she was off of Eliquis in anticipation of her TAVR procedure.  She was discharged on Eliquis again.  She then presented to [facility], ED 6/12 with altered mental status.  About an hour prior to ED arrival, she slumped over into her bed and exhibited total body shaking behavior before becoming minimally responsive.  While in the ED, she was witnessed to have a generalized tonic-clonic seizure.  She was given 2 mg of Ativan which abated the seizure.  Later on she was postictal with altered mental status and GCS reportedly of 6.  She was deemed to be protecting airway and therefore was not intubated.  Neurology was contacted and recommended transfer to [facility].  PCCM called for admission to ICU given her mental status. Prior to transfer, she received '3mg'$  Keppra.  Pertinent  Medical History:  has Essential hypertension, benign; Hyperlipidemia LDL goal <130; Hypothyroidism; Generalized anxiety disorder; Olecranon bursitis of left elbow; Osteopenia; Severe aortic stenosis; Tobacco abuse; S/P TAVR (transcatheter aortic valve replacement); History of cervical cancer; CVA (cerebral vascular accident) ([place]); Acute metabolic encephalopathy; Acute respiratory failure with hypoxia ([place]); Depression; A-fib [facility]); and Seizure ([place]) on their problem list.  Significant Hospital Events: Including procedures, antibiotic  start and stop dates in addition to other pertinent events   6/9 through 6/11 admitted for AMS due to embolic CVA. 6/12 readmitted for AMS due to seizures.  Interim History / Subjective:  Vitals stable. A little somnolent after receiving Ativan prior to transport. Does withdraw to noxious stimuli and is protecting airway.  Objective:  Blood pressure (!) 141/95, pulse (!) 101, temperature 98.1 F (36.7 C), temperature source Oral, resp. rate 20, height '5\' 7"'$  (1.702 m), weight 53.6 kg, SpO2 98 %.        Intake/Output Summary (Last 24 hours) at 02/07/2022 1024 Last data filed at 02/07/2022 0542 Gross per 24 hour  Intake 1200 ml  Output --  Net 1200 ml   Filed Weights   02/07/22 0051 02/07/22 1000  Weight: 56.7 kg 53.6 kg    Examination: General: Elderly female, resting in bed, in NAD. Neuro: Somnolent but withdraws to noxious stimuli, opens eyes but does not answer questions or follow commands. HEENT: [place]/AT. Sclerae anicteric. EOMI. Cardiovascular: IRIR, no M/R/G.  Lungs: Respirations even and unlabored.  CTA bilaterally, No W/R/R. Abdomen: BS x 4, soft, NT/ND.  Musculoskeletal: No gross deformities, no edema.  Skin: Intact, warm, no rashes.   Labs/imaging personally reviewed:  CT head/CTA head and neck 6/12 > negative.  Aspects 10.  No LVO, normal CTP. 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  Repeat CT negative for new acute process. S/p 3g Keppra at [facility]. - Follow-up on brain MRI, EEG. - Check UDS. - Further AEDs per neurology. Will defer redosing/ordering further Keppra given large load  of 3g prior to transfer. - Neurology notified of arrival to [facility]  Embolic CVA. - Continue Eliquis. If unable to safely take PO then will need to start heparin gtt in the interim.  - Per neuro.  Hx severe AS s/p TAVR 6/6, A-fib, chronic dCHF (Echo from 6/7 with EF 60-65%, severely elevated PAP, LA and RA severely  dilated), HTN. - Continue home Eliqius. - Hold home Bisoprolol. - PRN Hydralazine for SBP > 180.  Hx COPD, tobacco dependence. - BD's. - Tobacco cessation counseling.  Hx Hypothyroidism. - Continue home Synthroid.  Hx macular degeneration/retinal detachment. - Supportive care.  Best practice (evaluated daily):  Diet/type: NPO DVT prophylaxis: DOAC GI prophylaxis: N/A Lines: N/A Foley:  N/A Code Status:  full code Last date of multidisciplinary goals of care discussion: None yet.  Labs   CBC: Recent Labs  Lab 02/01/22 1443 02/02/22 0204 02/04/22 1250 02/05/22 0049 02/07/22 0049  WBC  --  10.3 11.6* 12.9* 12.5*  NEUTROABS  --   --   --   --  8.1*  HGB 14.3 12.6 13.5 13.4 15.2*  HCT 42.0 37.5 39.5 39.6 44.5  MCV  --  97.4 96.6 96.1 94.3  PLT  --  194 152 154 443    Basic Metabolic Panel: Recent Labs  Lab 02/02/22 0204 02/04/22 1250 02/05/22 0049 02/05/22 0953 02/06/22 0319 02/07/22 0049  NA 136 133* 134* 136 137 134*  K 4.1 3.3* 3.7 4.0 3.8 3.5  CL 102 97* 98 99 102 101  CO2 '26 31 25 25 26 25  '$ GLUCOSE 163* 109* 119* 112* 107* 151*  BUN 19 11 6* 8 13 24*  CREATININE 0.57 0.51 0.48 0.57 0.58 0.65  CALCIUM 8.6* 8.5* 8.9 9.2 9.1 8.9  MG 1.9  --   --   --   --   --    GFR: Estimated Creatinine Clearance: 53 mL/min (by C-G formula based on SCr of 0.65 mg/dL). Recent Labs  Lab 02/02/22 0204 02/04/22 1250 02/05/22 0049 02/07/22 0049  PROCALCITON  --   --   --  <0.10  WBC 10.3 11.6* 12.9* 12.5*    Liver Function Tests: Recent Labs  Lab 02/04/22 1250 02/05/22 0953 02/06/22 0319 02/07/22 0049  AST 49* 33 34 48*  ALT 74* 56* 51* 61*  ALKPHOS 145* 142* 130* 138*  BILITOT 1.1 1.2 1.2 1.2  PROT 7.0 6.9 6.4* 7.3  ALBUMIN 3.3* 3.3* 3.0* 3.5   No results for input(s): "LIPASE", "AMYLASE" in the last 168 hours. Recent Labs  Lab 02/05/22 0049  AMMONIA 23    ABG    Component Value Date/Time   PHART 7.369 11/22/2021 0918   PCO2ART 44.9  11/22/2021 0918   PO2ART 70 (L) 11/22/2021 0918   HCO3 25.9 11/22/2021 0918   TCO2 25 02/01/2022 1443   O2SAT 93 11/22/2021 0918     Coagulation Profile: Recent Labs  Lab 02/04/22 1302 02/07/22 0049  INR 1.1 1.4*    Cardiac Enzymes: No results for input(s): "CKTOTAL", "CKMB", "CKMBINDEX", "TROPONINI" in the last 168 hours.  HbA1C: HB A1C (BAYER DCA - WAIVED)  Date/Time Value Ref Range Status  04/28/2020 10:42 AM 5.7 <7.0 % Final    Comment:                                          Diabetic Adult            <  7.0                                       Healthy Adult        4.3 - 5.7                                                           (DCCT/NGSP) American Diabetes Association's Summary of Glycemic Recommendations for Adults with Diabetes: Hemoglobin A1c <7.0%. More stringent glycemic goals (A1c <6.0%) may further reduce complications at the cost of increased risk of hypoglycemia.    Hgb A1c MFr Bld  Date/Time Value Ref Range Status  02/04/2022 12:50 PM 5.6 4.8 - 5.6 % Final    Comment:    (NOTE) Pre diabetes:          5.7%-6.4%  Diabetes:              >6.4%  Glycemic control for   <7.0% adults with diabetes     CBG: Recent Labs  Lab 02/04/22 1253 02/05/22 0931  GLUCAP 119* 123*    Review of Systems:   Unable to obtain as pt is encephalopathic.  Past Medical History:  She,  has a past medical history of Anxiety, Atrial fibrillation ([place]), Cancer ([place]) (1979), COPD (chronic obstructive pulmonary disease) ([place]), Depression, DJD (degenerative joint disease), History of cervical cancer, Hyperlipidemia, Hypertension, Hypothyroidism, S/P TAVR (transcatheter aortic valve replacement) (02/01/2022), Severe aortic stenosis, Thyroid disease, and Tobacco abuse.   Surgical History:   Past Surgical History:  Procedure Laterality Date   AORTIC VALVE REPLACEMENT     APPENDECTOMY     pt recently had CT that said Appendix was there so she is unsure if this was actually  removed during hysterectomy as she was previously told   BREAST ENHANCEMENT SURGERY     CARDIOVERSION N/A 10/27/2021   Procedure: CARDIOVERSION;  Surgeon: Arnoldo Lenis, MD;  Location: [facility];  Service: Endoscopy;  Laterality: N/A;   INTRAOPERATIVE TRANSESOPHAGEAL ECHOCARDIOGRAM N/A 02/01/2022   Procedure: INTRAOPERATIVE TRANSESOPHAGEAL ECHOCARDIOGRAM;  Surgeon: Sherren Mocha, MD;  Location: [place];  Service: Open Heart Surgery;  Laterality: N/A;   RIGHT/LEFT HEART CATH AND CORONARY ANGIOGRAPHY N/A 11/22/2021   Procedure: RIGHT/LEFT HEART CATH AND CORONARY ANGIOGRAPHY;  Surgeon: Sherren Mocha, MD;  Location: [place] [facility];  Service: Cardiovascular;  Laterality: N/A;   TONSILLECTOMY     VAGINAL HYSTERECTOMY       Social History:   reports that she has quit smoking. Her smoking use included cigarettes. She has a 40.00 pack-year smoking history. She has never used smokeless tobacco. She reports that she does not currently use alcohol. She reports that she does not use drugs.   Family History:  Her family history includes Alcohol abuse in her brother; Anxiety disorder in her daughter; Arthritis in her daughter; Cancer in her maternal aunt and mother; Cirrhosis in her brother; Depression in her brother, daughter, sister, and sister; Diabetes in her daughter and sister; Fibromyalgia in her daughter; Heart attack in her father; Kidney disease in her mother; Liver disease in her brother; Obesity in her sister and sister.   Allergies Allergies  Allergen Reactions   Penicillins Hives   Singulair [Montelukast Sodium]  headache   Codeine Rash   Nicoderm [Nicotine] Hives and Swelling   Other Rash    Band-aids cause rash after a few days     Home Medications  Prior to Admission medications   Medication Sig Start Date End Date Taking? Authorizing Provider  acetaminophen (TYLENOL) 500 MG tablet Take 1,000 mg by mouth every 6 (six) hours as needed for moderate pain.   Yes [provider]  albuterol (VENTOLIN HFA) 108 (90 Base) MCG/ACT inhaler Inhale 2 puffs into the lungs every 6 (six) hours as needed for wheezing or shortness of breath. 11/01/21  Yes Gottschalk, Leatrice Jewels M, DO  atorvastatin (LIPITOR) 20 MG tablet Take 2 tablets (40 mg total) by mouth daily. 02/06/22  Yes Ghimire, Henreitta Leber, MD  bisoprolol (ZEBETA) 10 MG tablet Take 1.5 tablets (15 mg total) by mouth daily. 11/11/21  Yes BranchAlphonse Guild, MD  CALCIUM-MAGNESIUM PO Take 1 tablet by mouth daily.   Yes [provider]  cetirizine (ZYRTEC) 10 MG tablet Take 10 mg by mouth daily.   Yes [provider]  Cholecalciferol (VITAMIN D) 50 MCG (2000 UT) tablet Take 2,000 Units by mouth daily.   Yes [provider]  desvenlafaxine (PRISTIQ) 50 MG 24 hr tablet Take 1 tablet (50 mg total) by mouth daily. 09/08/21  Yes Gottschalk, Ashly M, DO  ELIQUIS 5 MG TABS tablet TAKE ONE TABLET BY MOUTH TWICE DAILY 02/03/22  Yes Gottschalk, Ashly M, DO  fluticasone (FLONASE) 50 MCG/ACT nasal spray Place 2 sprays into both nostrils daily as needed for allergies or rhinitis. 10/28/20  Yes Gottschalk, Ashly M, DO  furosemide (LASIX) 20 MG tablet TAKE ONE TABLET BY MOUTH DAILY Patient taking differently: Take 20 mg by mouth daily. 02/03/22  Yes Gottschalk, Leatrice Jewels M, DO  hydrocortisone cream 1 % Apply 1 application. topically 2 (two) times daily as needed (rash).   Yes [provider]  levothyroxine (SYNTHROID) 88 MCG tablet Take 1 tablet by mouth daily. Patient taking differently: Take 88 mcg by mouth daily before breakfast. 11/29/21  Yes Gottschalk, Ashly M, DO  Multiple Vitamins-Minerals (ALIVE WOMENS 50+ PO) Take 1 tablet by mouth daily.   Yes [provider]  Multiple Vitamins-Minerals (PRESERVISION AREDS 2 PO) Take 1 capsule by mouth in the morning and at bedtime.   Yes [provider]  Omega-3-6-9 CAPS Take 1 capsule by mouth 2 (two) times daily.   Yes [provider]  Polyethyl  Glycol-Propyl Glycol (SYSTANE OP) Place 1 drop into both eyes daily as needed (dry eyes).   Yes [provider]  potassium chloride SA (KLOR-CON M) 10 MEQ tablet Take 1 tablet (10 mEq total) by mouth daily. 01/28/22  Yes Eileen Stanford, PA-C  Probiotic Product (PROBIOTIC ADVANCED PO) Take 1 capsule by mouth daily.   Yes [provider]  traZODone (DESYREL) 50 MG tablet Take 0.5-1 tablets (25-50 mg total) by mouth at bedtime as needed for sleep. Patient taking differently: Take 50 mg by mouth at bedtime as needed for sleep. 04/15/21  Yes Gottschalk, Ashly M, DO  TURMERIC PO Take 1 capsule by mouth 2 (two) times daily.   Yes [provider]  umeclidinium-vilanterol (ANORO ELLIPTA) 62.5-25 MCG/ACT AEPB Inhale 1 puff into the lungs daily at 6 (six) AM. 11/01/21  Yes Janora Norlander, DO     Critical care time: 40 min.    Montey Hora, [place] [facility] For pager details, please see AMION or  use Epic chat  After 1900, please call [place] for cross coverage needs 02/07/2022, 10:24 AM

## 2022-02-07 NOTE — Assessment & Plan Note (Addendum)
Continue statin - when patient is able to tolerate PO LDL goal < 130 Last lipid panel 6/10 LDL 79, HDL low at 36

## 2022-02-07 NOTE — Assessment & Plan Note (Signed)
-  Likely secondary to post-ictal state -O2 was initially down to 86%, but improved as patient woke up between seizure like activity, now requiring O2 again after second seizure and only withdrawing from pain

## 2022-02-07 NOTE — ED Provider Notes (Addendum)
[facility] Emergency Department Provider Note MRN:  [id number]  Arrival date & time: 02/07/22     Chief Complaint   Altered Mental Status   History of Present Illness   Tammie Martin is a 71 y.o. year-old female with a history of A-fib, COPD, TAVR, stroke presenting to the ED with chief complaint of altered mental status.  Patient discharged today from the hospital.  Was admitted for acute embolic stroke.  Reportedly discharged at her baseline.  About an hour prior to arrival, patient slumped over onto the bed, exhibited total body shaking behavior, and was then minimally responsive.  Was quite somnolent with EMS.  At this time patient does not know her name or where she is and cannot name objects.  Review of Systems  A thorough review of systems was obtained and all systems are negative except as noted in the HPI and PMH.   Patient's Health History    Past Medical History:  Diagnosis Date   Anxiety    Atrial fibrillation ([place])    Cancer (HCC) 1979   cervical cancer   COPD (chronic obstructive pulmonary disease) (HCC)    Depression    DJD (degenerative joint disease)    History of cervical cancer    Hyperlipidemia    Hypertension    Hypothyroidism    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   s/p TAVR wtih a 85m Edwards S3UR via the right subclavian approach by Dr. CBurt Knackand Dr. BCyndia Bent  Severe aortic stenosis    Thyroid disease    Tobacco abuse     Past Surgical History:  Procedure Laterality Date   AORTIC VALVE REPLACEMENT     APPENDECTOMY     pt recently had CT that said Appendix was there so she is unsure if this was actually removed during hysterectomy as she was previously told   BREAST ENHANCEMENT SURGERY     CARDIOVERSION N/A 10/27/2021   Procedure: CARDIOVERSION;  Surgeon: BArnoldo Lenis MD;  Location: [facility];  Service: Endoscopy;  Laterality: N/A;   INTRAOPERATIVE TRANSESOPHAGEAL ECHOCARDIOGRAM N/A  02/01/2022   Procedure: INTRAOPERATIVE TRANSESOPHAGEAL ECHOCARDIOGRAM;  Surgeon: CSherren Mocha MD;  Location: [facility]  Service: Open Heart Surgery;  Laterality: N/A;   RIGHT/LEFT HEART CATH AND CORONARY ANGIOGRAPHY N/A 11/22/2021   Procedure: RIGHT/LEFT HEART CATH AND CORONARY ANGIOGRAPHY;  Surgeon: CSherren Mocha MD;  [facility];  Service: Cardiovascular;  Laterality: N/A;   TONSILLECTOMY     VAGINAL HYSTERECTOMY      Family History  Problem Relation Age of Onset   Cancer Mother        breast cancer at 370   Kidney disease Mother    Cancer Maternal Aunt        colon cancer   Heart attack Father    Diabetes Sister    Obesity Sister    Depression Sister    Alcohol abuse Brother    Cirrhosis Brother    Depression Brother    Liver disease Brother    Diabetes Daughter    Fibromyalgia Daughter    Depression Daughter    Anxiety disorder Daughter    Arthritis Daughter    Depression Sister    Obesity Sister     Social History   Socioeconomic History   Marital status: Widowed    Spouse name: Not on file   Number of children: 2   Years of education: 14   Highest education level: Associate degree: academic  program  Occupational History   Occupation: Retired    Comment: Equities trader  Tobacco Use   Smoking status: Former    Packs/day: 1.00    Years: 40.00    Total pack years: 40.00    Types: Cigarettes   Smokeless tobacco: Never  Vaping Use   Vaping Use: Never used  Substance and Sexual Activity   Alcohol use: Not Currently   Drug use: No   Sexual activity: Not Currently  Other Topics Concern   Not on file  Social History Narrative   Lives alone - one cat, one dog   Social Determinants of Health   Financial Resource Strain: Low Risk  (06/03/2021)   Overall Financial Resource Strain (CARDIA)    Difficulty of Paying Living Expenses: Not hard at all  Food Insecurity: No Food Insecurity (06/03/2021)   Hunger Vital Sign    Worried About Running  Out of Food in the Last Year: Never true    [place] in the Last Year: Never true  Transportation Needs: No Transportation Needs (06/03/2021)   PRAPARE - Hydrologist (Medical): No    Lack of Transportation (Non-Medical): No  Physical Activity: Inactive (06/03/2021)   Exercise Vital Sign    Days of Exercise per Week: 0 days    Minutes of Exercise per Session: 0 min  Stress: No Stress Concern Present (06/03/2021)   [place]    Feeling of Stress : Not at all  Social Connections: Moderately Isolated (06/03/2021)   Social Connection and Isolation Panel [NHANES]    Frequency of Communication with Friends and Family: More than three times a week    Frequency of Social Gatherings with Friends and Family: More than three times a week    Attends Religious Services: Never    Marine scientist or Organizations: Yes    Attends Music therapist: More than 4 times per year    Marital Status: Widowed  Intimate Partner Violence: Not At Risk (06/03/2021)   Humiliation, Afraid, Rape, and Kick questionnaire    Fear of Current or Ex-Partner: No    Emotionally Abused: No    Physically Abused: No    Sexually Abused: No     Physical Exam   Vitals:   02/07/22 0100 02/07/22 0135  BP: (!) 135/104 (!) 148/94  Pulse: (!) 110 72  Resp: (!) 27 (!) 38  Temp:    SpO2: 94% 97%    CONSTITUTIONAL: Chronically ill-appearing, NAD NEURO/PSYCH: Awake, not oriented to name place or time, unable to follow commands or name objects, concern for comprehensive aphasia EYES:  eyes equal and reactive ENT/NECK:  no LAD, no JVD CARDIO: Regular rate, well-perfused, normal S1 and S2 PULM:  CTAB no wheezing or rhonchi GI/GU:  non-distended, non-tender MSK/SPINE:  No gross deformities, no edema SKIN:  no rash, atraumatic   *Additional and/or pertinent findings included in MDM below  Diagnostic and  Interventional Summary    EKG Interpretation  Date/Time:  Monday February 07 2022 00:48:34 EDT Ventricular Rate:  115 PR Interval:    QRS Duration: 91 QT Interval:  309 QTC Calculation: 428 R Axis:   79 Text Interpretation: Atrial fibrillation LVH with secondary repolarization abnormality ST depression, consider ischemia, diffuse lds Confirmed by Gerlene Fee [phone]) on 02/07/2022 1:44:10 AM       Labs Reviewed  PROTIME-INR - Abnormal; Notable for the following components:  Result Value   Prothrombin Time 17.0 (*)    INR 1.4 (*)    All other components within normal limits  CBC - Abnormal; Notable for the following components:   WBC 12.5 (*)    Hemoglobin 15.2 (*)    All other components within normal limits  DIFFERENTIAL - Abnormal; Notable for the following components:   Neutro Abs 8.1 (*)    All other components within normal limits  COMPREHENSIVE METABOLIC PANEL - Abnormal; Notable for the following components:   Sodium 134 (*)    Glucose, Bld 151 (*)    BUN 24 (*)    AST 48 (*)    ALT 61 (*)    Alkaline Phosphatase 138 (*)    All other components within normal limits  RESP PANEL BY RT-PCR (FLU A&B, COVID) ARPGX2  ETHANOL  APTT  RAPID URINE DRUG SCREEN, HOSP PERFORMED  URINALYSIS, ROUTINE W REFLEX MICROSCOPIC  PROCALCITONIN  I-STAT CHEM 8, ED    DG Chest Port 1 View  Final Result    CT ANGIO HEAD NECK W WO CM W PERF (CODE STROKE)  Final Result    CT HEAD CODE STROKE WO CONTRAST`  Final Result    MR BRAIN W WO CONTRAST    (Results Pending)    Medications  LORazepam (ATIVAN) 2 MG/ML injection (has no administration in time range)  levETIRAcetam (KEPPRA) IVPB 1000 mg/100 mL premix (has no administration in time range)    And  levETIRAcetam (KEPPRA) IVPB 1000 mg/100 mL premix (1,000 mg Intravenous New Bag/Given 02/07/22 0249)  iohexol (OMNIPAQUE) 350 MG/ML injection 100 mL (100 mLs Intravenous Contrast Given 02/07/22 0124)  sodium chloride 0.9 % bolus 1,000  mL (1,000 mLs Intravenous New Bag/Given 02/07/22 0233)  LORazepam (ATIVAN) injection 2 mg (2 mg Intravenous Given 02/07/22 0228)     Procedures  /  Critical Care .Critical Care  Performed by: Maudie Flakes, MD Authorized by: Maudie Flakes, MD   Critical care provider statement:    Critical care time (minutes):  71   Critical care was necessary to treat or prevent imminent or life-threatening deterioration of the following conditions:  CNS failure or compromise (Concern for acute ischemic stroke)   Critical care was time spent personally by me on the following activities:  Development of treatment plan with patient or surrogate, discussions with consultants, evaluation of patient's response to treatment, examination of patient, ordering and review of laboratory studies, ordering and review of radiographic studies, ordering and performing treatments and interventions, pulse oximetry, re-evaluation of patient's condition and review of old charts   ED Course and Medical Decision Making  Initial Impression and Ddx Given patient's recent admission for acute embolic stroke and her presentation there is concern for seizure, hemorrhagic conversion of stroke, worsening or new embolic stroke.  Her neurological exam is somewhat concerning, awake but does not seem to understand questions, not oriented at all, cannot name objects, question of aphasia versus global encephalopathy.  Code stroke initiated, awaiting imaging, teleneurology recommendations.  Past medical/surgical history that increases complexity of ED encounter: History of recent stroke on Eliquis.  Interpretation of Diagnostics I personally reviewed the EKG and my interpretation is as follows: A-fib  Labs overall reassuring with no significant blood count or electrolyte disturbance.  CT imaging without acute hemorrhage, no obvious vascular occlusions, negative perfusion scan.  Patient Reassessment and Ultimate Disposition/Management      Case discussed with teleneurology, recommending admission for repeat MRI, EEG.  Update 3 AM: Patient  had a witnessed tonic-clonic seizure here in the emergency department.  Was given 2 mg Ativan IV.  Seizure activity slowly subsided after a minute or 2, and then she was deeply postictal.  Given 2 g Keppra.  Patient also exhibiting some A-fib with RVR, providing liter bolus.  Patient is slow to return to baseline.  She is currently GCS 6, withdrawals from pain in all extremities.  Given his clinical change, ICU consulted for admission and accepted by Dr. Audria Nine at [facility].  We will reach out to [facility] neurology team for recommendations.  Patient management required discussion with the following services or consulting groups:  Hospitalist Service and Neurology  Complexity of Problems Addressed Acute illness or injury that poses threat of life of bodily function  Additional Data Reviewed and Analyzed Further history obtained from: EMS on arrival  Additional Factors Impacting ED Encounter Risk Consideration of hospitalization  Barth Kirks. [place] [facility], [place] [email or web address]  Final Clinical Impressions(s) / ED Diagnoses     ICD-10-CM   1. Encephalopathy  G93.40     2. Seizure [facility])  R56.9       ED Discharge Orders     None        Discharge Instructions Discussed with and Provided to Patient:   Discharge Instructions   None      Maudie Flakes, MD 02/07/22 2263    Maudie Flakes, MD 02/07/22 [phone]

## 2022-02-07 NOTE — Assessment & Plan Note (Addendum)
-  Continue Pristiq when patient is able to tolerate PO

## 2022-02-07 NOTE — Consult Note (Signed)
[facility] TeleNeurology Consult Services  TeleStroke Metrics: Last Known Well: 02/06/2022 2330  [facility] Notification Time: 02/07/2022 0132  Stamp Time: 02/07/2022 0132 Telephone Response Time: Not Applicable Time First Login Attempt: 02/07/2022 0137 Video Start Time: 02/07/2022 0137 Video End Time: 02/07/2022 0151  Arrival Time/Door Time: 02/07/2022 0035 NIHSS Assessment Start Time: 02/07/2022 0146 Thrombolytic Medical Decision Time: 02/07/2022 0139 Decision on Thrombolytics: Not to give as the patient is currently on Eliquis, and also she has had recent acute embolic strokes that were discovered on 02/04/2022. mRS: 0  Interventional Candidate: Not a candidate as CTA head showed patent proximal bilateral MCA vessels and basilar artery. Discussed with Neurointerventionalist at: Not applicable  ED Physician notified of diagnostic impression and management plan at: 02/07/2022 0204   Chief Complaint: Altered mental state  HPI: Asked to see this patient in emergent telemedicine consultation utilizing interactive audio and video technologies. Consultation was performed with assistance of ancillary / medical staff at bedside. Verbal consent to perform the examination with telemedicine was obtained. Patient agreed to proceed with the consultation for acute stroke protocol.  71 year old white female who was brought to the emergency room by EMS after she had an abnormal shaking spell with subsequent altered mental status and unresponsiveness.  Patient with a history of persistent atrial fibrillation, and is currently on Eliquis.  More recently, she was admitted to the hospital on 02/01/2022 for a TAVR procedure.  TEE done during this time showed left atrial appendage sludge and left atriium and left atrial appendage thrombus.  She was restarted on her Eliquis and discharged home the next day on 02/02/2022.  Then she was brought back to the hospital on 02/04/2022 for confusion, headaches, and  vision changes.  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  Patient had a brain MRI that showed small multifocal embolic strokes within the bilateral cerebellum, left temporal lobe, and left frontal lobe.  EEG was negative for obvious seizures.  She was seen by cardiology, who recommended for her to continue her Eliquis.  Likely stroke etiology was noted to be from her left atrial thrombus and recently being off anticoagulation for her TAVR procedure.  Patient was also seen by the inpatient neurology team.  Last neurology progress note on 6/11 showed that the patient was alert and oriented to person only.  She had poor insight and was unable to name objects.  She had no focal motor weakness.  Looks like she was subsequently discharged home yesterday.  Patient is a poor historian.  According to the ER team, patient's last known well time was around 11:30 PM last night after her daughter woke her up and the patient used the bathroom.  As they were walking back to the bedroom, she had this whole body shaking spell and then just laid on her bed and was flaccid all over and was not speaking.  EMS was called.  Initially they noted she was unresponsive and nonverbal.  As they were coming to the ER, she was starting to wake up and speak a little bit more.  Head CT was negative.  CTA head and neck with perfusion were also negative as well.  On my exam, patient was only able to tell me her first name.  Other than that, she did not know any answers to the questions that I was asking.  She would perseverate at times.  She does not know where she is located at  or why she was brought to the hospital.  She did appear to have some mild right arm drifting when compared to the left.  Both legs are weak.    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  Patient smoked before in the past.  [place]:  Significant for cancer, MI, diabetes mellitus  ROS: 13 point review of systems were reviewed with the patient, and are all negative with the exception of the aforementioned in the history of present illness.  VS: Temperature 97.6 F, pulse 97, respiration 18, blood pressure 131/95, oxygen saturation 100% on 2 L nasal cannula  Exam: Patient is in no apparent distress.  Patient appears as stated age.  No obvious acute respiratory or cardiac distress.  Patient is well groomed and well-nourished. 1a- LOC: Keenly responsive - 0 1b- LOC questions: Answers neither questions correctly - 2 1c- LOC commands- Performs both tasks correctly- 0 2- Gaze: Normal; no gaze paresis or gaze deviation - 0 3- Visual Fields: normal, no Visual field deficit - 0 4- Facial movements: No facial palsy - 0 5- Upper limb motor - Right arm drift - 1 6- Lower limb motor - Bilateral leg drifts - 2 7- Limb Coordination: absent ataxia - 0 8- Sensory: no sensory loss - 0 9- Language - Possible aphasia - 1 10- Speech - No dysarthria - 0 11- Neglect / Extinction - none found - 0 NIHSS score: 6  Diagnostic Data: CT of the head showed no acute intracranial hemorrhage, mass, or large territory stroke  CTA head and neck with perfusion were all negative for high-grade stenosis or focal occlusions  EEG on 02/05/2022 showed no evidence of subclinical seizures  TEE performed on 02/01/2022 showed an ejection fraction of 60 to 65%, both the left and right atrium's were severely dilated, mild mitral regurgitation with  moderate to severe mitral annular calcifications; evidence of left atrial appendage sludge and left atrium and left atrial appendage thrombus.  Sodium 134, potassium 3.5, BUN 25, creatinine 0.65, blood glucose 151 WBC 12.5, hemoglobin 15.2, platelets 186 PT 17 and INR 1.4 Alcohol level negative  Medical Data Reviewed: 1.Data?reviewed include clinical labs, radiology,?and medical tests; 2.Tests?results discussed w/performing or interpreting physician; 3.Obtaining/reviewing old medical records; 4.Obtaining?case history from another source; 5.Independent?review of image, tracing, or specimen.  Medical Decision Making: - Extensive number of diagnosis or management options are considered below. - Extensive amount of complex data reviewed. - High risk of complication and/or morbidity or mortality are associated with differential diagnostic considerations below. - There may be?uncertain?outcome and increased probability of prolonged functional impairment or high probability of severe prolonged functional impairment associated with some of these differential diagnosis.  Differential Diagnosis for Stroke: 1.?Cardioembolic?stroke 2. Small vessel disease/lacune 3. Thromboembolic, artery-to-artery mechanism 4.?Hypercoagulable?state-related infarct 5. Transient ischemic attack 6. Thrombotic mechanism, large artery disease  Assessment: 1.  Altered mental status 2.  Persistent atrial fibrillation on Eliquis 3.  Recent multifocal embolic strokes in June 8588 4.  Severe aortic stenosis status post TAVR procedure 5.  Hypertension 6.  Hyperlipidemia  Recommendations: Patient hospital for further work-up of her symptoms Metabolic, infectious, and cardiac work-up  per primary team Consult inpatient neurology team to assist with evaluation and management Continue her Eliquis for now given her history of left atrial thrombus Final antithrombotic regiment to be determined by the inpatient neurology  team Okay to maintain systolic blood pressure to be less than 115 and diastolic blood pressure less than 90 given she is currently on anticoagulation DVT prophylaxis per primary tube  Check MRI brain with and without contrast to rule out any acute intracranial process or inflammatory process Check another EEG to rule out subclinical seizures Maintain the patient on telemetry to monitor her atrial fibrillation Check hemoglobin A1c, lipid panel, TSH, B12 level, and ammonia level Consult PT, OT, and ST Continue supportive care  Thank you for allowing [facility] to participate in the care of your patient. Please call me, Dr. Dale , with any questions at [phone]. Case discussed with the ER staff and Dr. Sedonia Small.  Critical Care notation:   I was called to see this critical patient emergently. I personally evaluated this critical patient for acute stroke evaluation, and determining their eligibility for IV thrombolytics and interventional therapies.  I have spent approximately 14 minutes with the patient, including time at bedside, time discussing the case with other physicians, reviewing plan of care, and time independently reviewing the records and scans.

## 2022-02-07 NOTE — Telephone Encounter (Addendum)
Her sister says she seemed to be doing a little better yesterday, was d/c home - last night she had a seizure and is now back at [facility] in ICU. She will keep [place] posted.

## 2022-02-07 NOTE — ED Triage Notes (Signed)
Pt had an episode at home where she started shaking and laid flacid on the bed, no hx of seizures. Ems said that she was fairly unresponsive to start with but has started to come around as time has passed

## 2022-02-07 NOTE — Progress Notes (Signed)
LTM EEG hooked up and running - no initial skin breakdown - push button tested - neuro notified. Atrium monitoring.  

## 2022-02-07 NOTE — Assessment & Plan Note (Signed)
-  Holding antihypertensive medication until acute stroke has been ruled out - for permissive HTN

## 2022-02-07 NOTE — Progress Notes (Signed)
Code stroke activated at 4 EDP evaluated off screen Pt in CT prior to activation Pt returned from CT at [place] Paged TS at [place] on screen at [phone]

## 2022-02-07 NOTE — ED Notes (Signed)
Pt trying to climb out of bed stating she has to "pee"; purewick applied and pt told to urinate; pt still repeating "I need to pee"; pt then placed on bed pan; pt able to urinate small amount

## 2022-02-07 NOTE — Assessment & Plan Note (Signed)
-   Most likely secondary to post-ictal state -Repeat head CT and CTA do not show acute findings -Mental status change since discharge approx 12 hours prior -Tele-neuro rec's admission to [facility] for MRI and EEG -Hypoxia could be contributing as well - but more likely hypoxia is the result of post ictal state as well -concern for acute CVA - hold antihypertensives for permissive HTN -Seizure precautions -TIA/CVA orderset utilized: PT/OT/ ST eval and treat -History of hypothyroidism - last TSH 02/05/22 = 0.399 -EtOH and Tylenol are undetectable -UDS pending -Leukocytosis present, but low suspicion for infection at this time - CXR negative, UA pending, procal pending -Continue to monitor

## 2022-02-07 NOTE — Consult Note (Addendum)
Neurology Consultation  Reason for Consult: Seizures Referring Physician: Tacy Learn  CC: Seizures  History is obtained from:Chart  HPI: Tammie Martin is a 71 y.o. female A-fib on eliquis, HTN, HLD, COPD, left atrial clot, TAVR, and recent stroke. She was recently admitted 1/6-1/09 for embolic infarcts in the left frontal cortex, left posterior temporal cortex, and bilateral cerebellum related to a left atrial appendage thrombus. Anticoagulation was stopped in anticipation of her TAVR and then eliquis was resumed at discharge per cardiology recommendations. She presented to [facility] ED on 6/12 with AMS. An hour prior to her arrival in the ED she slumped over, had total body shaking, and became minimally responsive. At [facility] ED she had a witnessed tonic-clonic seizure that was stopped with '2mg'$  of Ativan. She was reported to be postictal with a GCS of 6. Per nursing she received 3g of Keppra prior to transfer to [facility].   ROS: Unable to obtain due to altered mental status.   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 CBurt Knackand Dr. BCyndia Bent  Severe aortic stenosis    Thyroid disease    Tobacco abuse     Family History  Problem Relation Age of Onset   Cancer Mother        breast cancer at 379   Kidney disease Mother    Cancer Maternal Aunt        colon cancer   Heart attack Father    Diabetes Sister    Obesity Sister    Depression Sister    Alcohol abuse Brother    Cirrhosis Brother    Depression Brother    Liver disease Brother    Diabetes Daughter    Fibromyalgia Daughter     Depression Daughter    Anxiety disorder Daughter    Arthritis Daughter    Depression Sister    Obesity Sister     Social History:   reports that she has quit smoking. Her smoking use included cigarettes. She has a 40.00 pack-year smoking history. She has never used smokeless tobacco. She reports that she does not currently use alcohol. She reports that she does not use drugs.  Medications  Current Facility-Administered Medications:    acetaminophen (TYLENOL) tablet 650 mg, 650 mg, Oral, Q4H PRN **OR** acetaminophen (TYLENOL) 160 MG/5ML solution 650 mg, 650 mg, Per Tube, Q4H PRN **OR** acetaminophen (TYLENOL) suppository 650 mg, 650 mg, Rectal, Q4H PRN, Zierle-Ghosh, Asia B, DO   albuterol (PROVENTIL) (2.5 MG/3ML) 0.083% nebulizer solution 3 mL, 3 mL, Nebulization, Q3H PRN, Desai, Rahul P, PA-C   apixaban (ELIQUIS) tablet 5 mg, 5 mg, Oral, BID, Zierle-Ghosh, Asia B, DO   atorvastatin (LIPITOR) tablet 40 mg, 40 mg, Oral, Daily, Zierle-Ghosh, Asia B, DO   Chlorhexidine Gluconate Cloth 2 % PADS 6 each, 6 each, Topical, Q0600, Chand, Sudham, MD, 6 each at 02/07/22 1000   docusate sodium (COLACE) capsule 100 mg, 100 mg, Oral, BID PRN, Desai, Rahul P, PA-C   hydrALAZINE (APRESOLINE) injection 10-40 mg, 10-40 mg, Intravenous,  Q4H PRN, Shearon Stalls, Rahul P, PA-C   ipratropium (ATROVENT) nebulizer solution 0.5 mg, 0.5 mg, Nebulization, Q6H, Desai, Rahul P, PA-C   lactated ringers infusion, , Intravenous, Continuous, Desai, Rahul P, PA-C, Last Rate: 50 mL/hr at 02/07/22 1032, New Bag at 02/07/22 1032   [START ON 02/08/2022] levothyroxine (SYNTHROID) tablet 88 mcg, 88 mcg, Oral, QAC breakfast, Zierle-Ghosh, Asia B, DO   LORazepam (ATIVAN) 2 MG/ML injection, , , ,    LORazepam (ATIVAN) injection 2 mg, 2 mg, Intravenous, Q5 min PRN, Zierle-Ghosh, Asia B, DO   ondansetron (ZOFRAN) injection 4 mg, 4 mg, Intravenous, Q6H PRN, Zierle-Ghosh, Asia B, DO   polyethylene glycol (MIRALAX / GLYCOLAX) packet 17 g, 17 g,  Oral, Daily PRN, Desai, Rahul P, PA-C   senna-docusate (Senokot-S) tablet 1 tablet, 1 tablet, Oral, QHS PRN, Zierle-Ghosh, Asia B, DO   Exam: Current vital signs: BP (!) 141/95 (BP Location: Right Arm)   Pulse (!) 101   Temp 98.1 F (36.7 C) (Oral)   Resp 20   Ht '5\' 7"'$  (1.702 m)   Wt 53.6 kg   SpO2 98%   BMI 18.51 kg/m  Vital signs in last 24 hours: Temp:  [97.6 F (36.4 C)-98.1 F (36.7 C)] 98.1 F (36.7 C) (06/12 1000) Pulse Rate:  [61-118] 101 (06/12 1000) Resp:  [18-38] 20 (06/12 1000) BP: (119-148)/(74-104) 141/95 (06/12 1000) SpO2:  [86 %-100 %] 98 % (06/12 1000) Weight:  [53.6 kg-56.7 kg] 53.6 kg (06/12 1000)  GENERAL: Awake, alert, in no acute distress Psych: Affect appropriate for situation, patient is calm and cooperative with examination Head: Normocephalic and atraumatic, without obvious abnormality EENT: Normal conjunctivae, dry mucous membranes, no OP obstruction LUNGS: Normal respiratory effort. Non-labored breathing on room air CV: Regular rate and rhythm on telemetry ABDOMEN: Soft, non-tender, non-distended Extremities: warm, well perfused, without obvious deformity  NEURO:  Mental Status: Awakens to voices and will track examiner, does not answer questions or follow commands.  Cranial Nerves:  II: PERRL 3 mm/brisk. visual fields full.  III, IV, VI: EOMI. Lid elevation symmetric and full.  V: Sensation is intact to light touch and symmetrical to face. Blinks to threat. Moves jaw back and forth.  VII: Face is symmetric resting and smiling. Able to puff cheeks and raise eyebrows.  VIII: Hearing intact to voice IX, X: Palate elevation is symmetric. Phonation normal.  XI: Normal sternocleidomastoid and trapezius muscle strength XII: Tongue protrudes midline without fasciculations.   Motor: Tone is normal. Bulk is normal. Moves all extremities antigravity. Will not hold extremities off the bed to commands Sensation: Withdraws to pain in all  extremities Coordination: Does not follow commands to, no gross ataxia or tremor noted DTRs: 2+ throughout.  Gait: Deferred  Labs I have reviewed labs in epic and the results pertinent to this consultation are:   CBC    Component Value Date/Time   WBC 12.5 (H) 02/07/2022 0049   RBC 4.72 02/07/2022 0049   HGB 15.2 (H) 02/07/2022 0049   HGB 14.8 08/24/2021 1655   HCT 44.5 02/07/2022 0049   HCT 43.7 08/24/2021 1655   PLT 186 02/07/2022 0049   PLT 313 08/24/2021 1655   MCV 94.3 02/07/2022 0049   MCV 92 08/24/2021 1655   MCH 32.2 02/07/2022 0049   MCHC 34.2 02/07/2022 0049   RDW 13.1 02/07/2022 0049   RDW 12.7 08/24/2021 1655   LYMPHSABS 2.9 02/07/2022 0049   LYMPHSABS 2.5 03/06/2020 1057   MONOABS 1.0 02/07/2022 0049   EOSABS  0.5 02/07/2022 0049   EOSABS 0.2 03/06/2020 1057   BASOSABS 0.1 02/07/2022 0049   BASOSABS 0.1 03/06/2020 1057    CMP     Component Value Date/Time   NA 134 (L) 02/07/2022 0049   NA 140 09/16/2021 1238   K 3.5 02/07/2022 0049   CL 101 02/07/2022 0049   CO2 25 02/07/2022 0049   GLUCOSE 151 (H) 02/07/2022 0049   BUN 24 (H) 02/07/2022 0049   BUN 18 09/16/2021 1238   CREATININE 0.65 02/07/2022 0049   CALCIUM 8.9 02/07/2022 0049   PROT 7.3 02/07/2022 0049   PROT 7.0 08/24/2021 1655   ALBUMIN 3.5 02/07/2022 0049   ALBUMIN 4.1 08/24/2021 1655   AST 48 (H) 02/07/2022 0049   ALT 61 (H) 02/07/2022 0049   ALKPHOS 138 (H) 02/07/2022 0049   BILITOT 1.2 02/07/2022 0049   BILITOT 0.4 08/24/2021 1655   GFRNONAA >60 02/07/2022 0049   GFRAA 111 03/06/2020 1057    Lipid Panel     Component Value Date/Time   CHOL 133 02/05/2022 0941   CHOL 144 04/15/2021 1328   TRIG 89 02/05/2022 0941   HDL 36 (L) 02/05/2022 0941   HDL 41 04/15/2021 1328   CHOLHDL 3.7 02/05/2022 0941   VLDL 18 02/05/2022 0941   LDLCALC 79 02/05/2022 0941   LDLCALC 84 04/15/2021 1328     Imaging I have reviewed the images obtained:  CT-scan of the brain- no acute  intracranial abnormality CTA head and neck- no LVO or high grade stenosis, normal CTP  02/04/2022- MRI examination of the brain- Few punctate acute to subacute infarcts involving different vascular territories. No hemorrhage.  Assessment: 71 year old female with a PMH of afib on eliquis, TAVR, HTN, HLD presenting with seizure activity after a TAVR and subsequent stroke while she was off of her eliquis.   Recommendations: - Continue Keppra '1000mg'$  BID starting tonight at 10pm - EEG ordered. - Limit ativan use  Patient seen and examined by NP/APP with MD. MD to update note as needed.   Janine Ores, DNP, FNP-BC [facility] Pager: [phone]  Attending Attestation:  Patient seen, examined, labs,vitals and notes reviewed. Discussed plan with Charlean Merl, NP and agree with assessment and plan as documented above. I have independently reviewed the chart, obtained history, review of systems and examined the patient.  Electronically signed by:  Lynnae Sandhoff, MD Page: [phone] 02/07/2022, 5:49 PM

## 2022-02-07 NOTE — Assessment & Plan Note (Addendum)
-  Continue Synthroid - when patient is able to tolerate PO

## 2022-02-08 DIAGNOSIS — I4819 Other persistent atrial fibrillation: Secondary | ICD-10-CM

## 2022-02-08 DIAGNOSIS — J9601 Acute respiratory failure with hypoxia: Secondary | ICD-10-CM

## 2022-02-08 LAB — COMPREHENSIVE METABOLIC PANEL
ALT: 38 U/L (ref 0–44)
AST: 28 U/L (ref 15–41)
Albumin: 2.8 g/dL — ABNORMAL LOW (ref 3.5–5.0)
Alkaline Phosphatase: 106 U/L (ref 38–126)
Anion gap: 11 (ref 5–15)
BUN: 12 mg/dL (ref 8–23)
CO2: 23 mmol/L (ref 22–32)
Calcium: 8.5 mg/dL — ABNORMAL LOW (ref 8.9–10.3)
Chloride: 104 mmol/L (ref 98–111)
Creatinine, Ser: 0.57 mg/dL (ref 0.44–1.00)
GFR, Estimated: >60 mL/min (ref >60)
Glucose, Bld: 81 mg/dL (ref 70–99)
Potassium: 3.3 mmol/L — ABNORMAL LOW (ref 3.5–5.1)
Sodium: 138 mmol/L (ref 135–145)
Total Bilirubin: 0.9 mg/dL (ref 0.3–1.2)
Total Protein: 6.1 g/dL — ABNORMAL LOW (ref 6.5–8.1)

## 2022-02-08 LAB — CBC
HCT: 39.8 % (ref 36.0–46.0)
Hemoglobin: 13.4 g/dL (ref 12.0–15.0)
MCH: 32.1 pg (ref 26.0–34.0)
MCHC: 33.7 g/dL (ref 30.0–36.0)
MCV: 95.4 fL (ref 80.0–100.0)
Platelets: 142 10*3/uL — ABNORMAL LOW (ref 150–400)
RBC: 4.17 MIL/uL (ref 3.87–5.11)
RDW: 13.1 % (ref 11.5–15.5)
WBC: 12.1 10*3/uL — ABNORMAL HIGH (ref 4.0–10.5)
nRBC: 0 % (ref 0.0–0.2)

## 2022-02-08 LAB — PHOSPHORUS: Phosphorus: 3.4 mg/dL (ref 2.5–4.6)

## 2022-02-08 LAB — MAGNESIUM: Magnesium: 1.8 mg/dL (ref 1.7–2.4)

## 2022-02-08 MED ORDER — BISOPROLOL FUMARATE 5 MG PO TABS
15.0000 mg | ORAL_TABLET | Freq: Every day | ORAL | Status: DC
  Administered 2022-02-08 – 2022-02-11 (×4): 15 mg via ORAL
  Filled 2022-02-08 (×4): qty 1

## 2022-02-08 MED ORDER — POTASSIUM CHLORIDE 10 MEQ/100ML IV SOLN
10.0000 meq | INTRAVENOUS | Status: AC
  Administered 2022-02-08 (×4): 10 meq via INTRAVENOUS
  Filled 2022-02-08 (×4): qty 100

## 2022-02-08 MED ORDER — LEVETIRACETAM 500 MG PO TABS
1000.0000 mg | ORAL_TABLET | Freq: Two times a day (BID) | ORAL | Status: DC
  Administered 2022-02-08 – 2022-02-09 (×2): 1000 mg via ORAL
  Filled 2022-02-08 (×2): qty 2

## 2022-02-08 MED ORDER — MAGNESIUM SULFATE IN D5W 1-5 GM/100ML-% IV SOLN
1.0000 g | Freq: Once | INTRAVENOUS | Status: AC
Start: 2022-02-08 — End: 2022-02-08
  Administered 2022-02-08: 1 g via INTRAVENOUS
  Filled 2022-02-08: qty 100

## 2022-02-08 MED ORDER — IPRATROPIUM BROMIDE 0.02 % IN SOLN
0.5000 mg | Freq: Two times a day (BID) | RESPIRATORY_TRACT | Status: DC
  Administered 2022-02-08 – 2022-02-10 (×4): 0.5 mg via RESPIRATORY_TRACT
  Filled 2022-02-08 (×5): qty 2.5

## 2022-02-08 MED ORDER — MAGNESIUM SULFATE IN D5W 1-5 GM/100ML-% IV SOLN
1.0000 g | Freq: Once | INTRAVENOUS | Status: AC
  Administered 2022-02-08: 1 g via INTRAVENOUS
  Filled 2022-02-08: qty 100

## 2022-02-08 MED ORDER — LIP MEDEX EX OINT
TOPICAL_OINTMENT | CUTANEOUS | Status: DC | PRN
  Administered 2022-02-08: 1 via TOPICAL
  Filled 2022-02-08: qty 7

## 2022-02-08 NOTE — Progress Notes (Signed)
eLink Physician-Brief Progress Note Patient Name: Tammie Martin DOB: 16-May-1951 MRN: [id number]   Date of Service  02/08/2022  HPI/Events of Note  Patient admitted with AFIB, L atrial clot and recent embolic CVA. Eliquis ordered. However, the patient is NPO.   eICU Interventions  Plan: Place NGT for nutrition and Eliquis administration. Abdominal film post NGT placement for gastric tube tip placement.      Intervention Category Major Interventions: Other:  Lysle Dingwall 02/08/2022, 12:39 AM

## 2022-02-08 NOTE — Progress Notes (Signed)
TAVR Team Note: Notified of patient's admission.  Patient with known atrial fibrillation, sustained a cardioembolic stroke about 72 hours after TAVR.  At the time of her intraprocedural TEE, she was noted to have heavy sludge in the left atrial appendage.  Her anticoagulation was started back the following morning, but she presented 02/04/2022 with encephalopathy and was found to have sustained a stroke.  She was discharged from the hospital but then returned with seizure-like activity and is now admitted at [facility].  She continues to have expressive aphasia.  She remains on oral anticoagulation with apixaban.  I spoke with the patient and her daughter who is present at the bedside.  Appreciate care of the neurology and hospitalist teams.  Suspect she may benefit from inpatient rehab if she is felt to be a candidate.  Hopefully she will continue to improve.  We will reschedule her post-TAVR follow-up in our office.  Sherren Mocha MD 02/08/2022 3:26 PM

## 2022-02-08 NOTE — Progress Notes (Signed)
NAME:  Tammie Martin, MRN:  [id number], DOB:  26-Nov-1950, LOS: 1 ADMISSION DATE:  02/07/2022, CONSULTATION DATE:  02/07/22 REFERRING MD:  Sedonia Small - [facility] CHIEF COMPLAINT:  AMS   History of Present Illness:  GEMINI BUNTE is a 71 y.o. 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  This was felt to be embolic in the setting of left atrial appendage thrombus that was seen on intraoperative TEE on 02/01/2022.  It was suspected that she may have developed a thrombus while she was off of Eliquis in anticipation of her TAVR procedure.  She was discharged on Eliquis again.  She then presented to [facility], ED 6/12 with altered mental status.  About an hour prior to ED arrival, she slumped over into her bed and exhibited total body shaking behavior before becoming minimally responsive.  While in the ED, she was witnessed to have a generalized tonic-clonic seizure.  She was given 2 mg of Ativan which abated the seizure.  Later on she was postictal with altered mental status and GCS reportedly of 6.  She was deemed to be protecting airway and therefore was not intubated.  Neurology was contacted and recommended transfer to [facility].  PCCM called for admission to ICU given her mental status. Prior to transfer, she received '3mg'$  Keppra.  Pertinent  Medical History:  has Essential hypertension, benign; Hyperlipidemia LDL goal <130; Hypothyroidism; Generalized anxiety disorder; Olecranon bursitis of left elbow; Osteopenia; Severe aortic stenosis; Tobacco abuse; S/P TAVR (transcatheter aortic valve replacement); History of cervical cancer; CVA (cerebral vascular accident) ([place]); Acute metabolic encephalopathy; Acute respiratory failure with hypoxia ([place]); Depression; A-fib ([place]); Seizure ([place]); and Encephalopathy on their problem list.  Significant Hospital Events: Including  procedures, antibiotic start and stop dates in addition to other pertinent events   6/9 through 6/11 admitted for AMS due to embolic CVA. 6/12 readmitted for AMS due to seizures.  Interim History / Subjective:  Patient mental status is better today She is on EEG which showed epileptogenicity but no active seizures  Objective:  Blood pressure (!) 168/91, pulse 89, temperature (!) 97.4 F (36.3 C), temperature source Oral, resp. rate (!) 27, height '5\' 7"'$  (1.702 m), weight 60 kg, SpO2 93 %.        Intake/Output Summary (Last 24 hours) at 02/08/2022 1016 Last data filed at 02/08/2022 0600 Gross per 24 hour  Intake 246.29 ml  Output 260 ml  Net -13.71 ml   Filed Weights   02/07/22 0051 02/07/22 1000 02/08/22 0500  Weight: 56.7 kg 53.6 kg 60 kg    Examination: General: Chronically ill looking elderly female, resting in bed,  Neuro: Opens eyes with vocal stimuli, following simple commands, moving all 4 extremities HEENT: [place]/AT. Sclerae anicteric. EOMI. Cardiovascular: IRIR, no M/R/G.  Lungs: Respirations even and unlabored.  CTA bilaterally, No W/R/R. Abdomen: BS x 4, soft, NT/ND.  Musculoskeletal: No gross deformities, no edema.  Skin: Intact, warm, no rashes.   Labs/imaging personally reviewed:  CT head/CTA head and neck 6/12 > negative.  Aspects 10.  No LVO, normal CTP. MRI brain 6/12 > showed punctate foci of acute stroke EEG 6/12 > negative for active seizures  Assessment & Plan:  Seizure like activity  Repeat CT negative for new acute process. S/p 3g Keppra at [facility]. MRI showed punctate strokes EEG is negative for active seizures but consistent with high risk of  epileptogenicity Continue Keppra 1 g twice daily Neurology is following  Acute metabolic encephalopathy likely due to seizure activity Avoid sedation with RASS goal 0  Embolic CVA In the setting of A-fib and Eliquis was on hold for TAVR Eliquis was resumed Stroke team is following  Hx severe AS s/p TAVR  6/6, chronic dCHF (Echo from 6/7 with EF 60-65%, severely elevated PAP, LA and RA severely dilated), HTN. Continue home Eliqius. Resume home Bisoprolol.  Hx COPD, tobacco dependence. Continue as needed bronchodilators Tobacco cessation counseling provided  Hypothyroidism. Continue home Synthroid.  Persistent atrial fibrillation Heart rate is better controlled Eliquis has been resumed  Hypokalemia Continue aggressive electrolyte supplement and monitor  Best practice (evaluated daily):  Diet/type: NPO DVT prophylaxis: DOAC GI prophylaxis: N/A Lines: N/A Foley:  N/A Code Status:  full code Last date of multidisciplinary goals of care discussion: We will update patient's sister today  Labs   CBC: Recent Labs  Lab 02/02/22 0204 02/04/22 1250 02/05/22 0049 02/07/22 0049 02/08/22 0201  WBC 10.3 11.6* 12.9* 12.5* 12.1*  NEUTROABS  --   --   --  8.1*  --   HGB 12.6 13.5 13.4 15.2* 13.4  HCT 37.5 39.5 39.6 44.5 39.8  MCV 97.4 96.6 96.1 94.3 95.4  PLT 194 152 154 186 142*    Basic Metabolic Panel: Recent Labs  Lab 02/02/22 0204 02/04/22 1250 02/05/22 0049 02/05/22 0953 02/06/22 0319 02/07/22 0049 02/08/22 0201  NA 136   < > 134* 136 137 134* 138  K 4.1   < > 3.7 4.0 3.8 3.5 3.3*  CL 102   < > 98 99 102 101 104  CO2 26   < > '25 25 26 25 23  '$ GLUCOSE 163*   < > 119* 112* 107* 151* 81  BUN 19   < > 6* 8 13 24* 12  CREATININE 0.57   < > 0.48 0.57 0.58 0.65 0.57  CALCIUM 8.6*   < > 8.9 9.2 9.1 8.9 8.5*  MG 1.9  --   --   --   --   --  1.8  PHOS  --   --   --   --   --   --  3.4   < > = values in this interval not displayed.   GFR: Estimated Creatinine Clearance: 59.3 mL/min (by C-G formula based on SCr of 0.57 mg/dL). Recent Labs  Lab 02/04/22 1250 02/05/22 0049 02/07/22 0049 02/08/22 0201  PROCALCITON  --   --  <0.10  --   WBC 11.6* 12.9* 12.5* 12.1*    Liver Function Tests: Recent Labs  Lab 02/04/22 1250 02/05/22 0953 02/06/22 0319 02/07/22 0049  02/08/22 0201  AST 49* 33 34 48* 28  ALT 74* 56* 51* 61* 38  ALKPHOS 145* 142* 130* 138* 106  BILITOT 1.1 1.2 1.2 1.2 0.9  PROT 7.0 6.9 6.4* 7.3 6.1*  ALBUMIN 3.3* 3.3* 3.0* 3.5 2.8*   No results for input(s): "LIPASE", "AMYLASE" in the last 168 hours. Recent Labs  Lab 02/05/22 0049  AMMONIA 23    ABG    Component Value Date/Time   PHART 7.369 11/22/2021 0918   PCO2ART 44.9 11/22/2021 0918   PO2ART 70 (L) 11/22/2021 0918   HCO3 25.9 11/22/2021 0918   TCO2 25 02/01/2022 1443   O2SAT 93 11/22/2021 0918     Coagulation Profile: Recent Labs  Lab 02/04/22 1302 02/07/22 0049  INR 1.1 1.4*    Cardiac Enzymes: No results for  input(s): "CKTOTAL", "CKMB", "CKMBINDEX", "TROPONINI" in the last 168 hours.  HbA1C: HB A1C (BAYER DCA - WAIVED)  Date/Time Value Ref Range Status  04/28/2020 10:42 AM 5.7 <7.0 % Final    Comment:                                          Diabetic Adult            <7.0                                       Healthy Adult        4.3 - 5.7                                                           (DCCT/NGSP) American Diabetes Association's Summary of Glycemic Recommendations for Adults with Diabetes: Hemoglobin A1c <7.0%. More stringent glycemic goals (A1c <6.0%) may further reduce complications at the cost of increased risk of hypoglycemia.    Hgb A1c MFr Bld  Date/Time Value Ref Range Status  02/04/2022 12:50 PM 5.6 4.8 - 5.6 % Final    Comment:    (NOTE) Pre diabetes:          5.7%-6.4%  Diabetes:              >6.4%  Glycemic control for   <7.0% adults with diabetes     CBG: Recent Labs  Lab 02/04/22 1253 02/05/22 0931  GLUCAP 119* 123*      Jacky Kindle, MD [facility] See Amion for pager If no response to pager, please call [phone] until 7pm After 7pm, Please call E-link [phone]

## 2022-02-08 NOTE — Progress Notes (Signed)
Neurology Progress Note  Brief HPI: EBONE ALCIVAR is a 71 y.o. female A-fib on eliquis, HTN, HLD, COPD, left atrial clot, TAVR, and recent stroke. She was recently admitted 0/8-6/76 for embolic infarcts in the left frontal cortex, left posterior temporal cortex, and bilateral cerebellum related to a left atrial appendage thrombus. Anticoagulation was stopped in anticipation of her TAVR and then eliquis was resumed at discharge per cardiology recommendations. She presented to [facility] ED on 6/12 with AMS. An hour prior to her arrival in the ED she slumped over, had total body shaking, and became minimally responsive. At [facility] ED she had a witnessed tonic-clonic seizure that was stopped with 28m of Ativan. She was reported to be postictal with a GCS of 6. Per nursing she received 3g of Keppra [facility]   Subjective: Seen in room, still connected to LTM. She is awake and interactive today, but still having confusion. Able to take meds with applesauce, eliquis resumed overnight.   Exam: Vitals:   02/08/22 0900 02/08/22 1100  BP: (!) 168/91   Pulse: 89   Resp: (!) 27   Temp:  98.1 F (36.7 C)  SpO2: 93%    Gen: In bed, NAD Resp: non-labored breathing, no acute distress Abd: soft, nt  NEURO:  Mental Status: Awake, alert, Aox1. Does not know place, date, or situation. Perseverates on previous question, difficulty following single step commands.  Cranial Nerves:  II: PERRL 3 mm/brisk. visual fields full.  III, IV, VI: EOMI. Lid elevation symmetric and full.  V: Sensation is intact to light touch and symmetrical to face. Blinks to threat. Moves jaw back and forth.  VII: Face is symmetric resting and smiling. Able to puff cheeks and raise eyebrows.  VIII: Hearing intact to voice IX, X: Palate elevation is symmetric. Phonation normal.  XI: Normal sternocleidomastoid and trapezius muscle strength XII: Tongue protrudes midline without fasciculations.   Motor: Tone is  normal. Bulk is normal. Moves all extremities antigravity. Will not hold extremities off the bed to commands Sensation: Withdraws to pain in all extremities Coordination: Does not follow commands to, no gross ataxia or tremor noted DTRs: 2+ throughout.  Gait: Deferred  Pertinent Labs: Alk Phos 106 AST/ALT 28/38 WBC 12.1 THC- positive  Imaging Reviewed: EEG- This study showed evidence of epileptogenicity arising from  left posterior quadrant.  There is also cortical dysfunction in left hemisphere which could be secondary to underlying stroke.  Additionally there is mild diffuse encephalopathy, nonspecific etiology. No seizures  were seen throughout the recording.  MRI- Significantly motion limited study. Punctate foci of apparent restricted diffusion in the left fronta and right occipital lobes and right cerebellum could represent punctate acute infarcts or artifact.  Assessment: 71year old female with a PMH of afib on eliquis, TAVR, HTN, HLD presenting with seizure activity after a TAVR and subsequent stroke while she was off of her eliquis. Resume eliquis, continue keppra.   Impression:  Seizures- hesitant to increase AEDs due to current confusion. Liver enzymes improving so we could add another agent.   Recommendations: - continue Keppra 10053mBID - continue LTM for today  - Resume eliquis and continue home medications   Patient seen and examined by NP/APP with MD. MD to update note as needed.   DeJanine OresDNP, FNP-BC [facility] Pager: [phone]

## 2022-02-08 NOTE — Progress Notes (Signed)
OT Cancellation Note  Patient Details Name: Tammie Martin MRN: [id number] DOB: Jan 14, 1951   Cancelled Treatment:    Reason Eval/Treat Not Completed: Patient at procedure or test/ unavailable- pt remains on continuous EEG.  Will follow and see as able.   [name], OT Acute Rehabilitation Services Office [phone]   Delight Stare 02/08/2022, 11:39 AM

## 2022-02-08 NOTE — Evaluation (Signed)
Speech Language Pathology Evaluation Patient Details Name: Tammie Martin MRN: [id number] DOB: 28-Jan-1951 Today's Date: 02/08/2022 Time: 4098-1191 SLP Time Calculation (min) (ACUTE ONLY): 33 min  Problem List:  Patient Active Problem List   Diagnosis Date Noted   Acute metabolic encephalopathy 47/82/9562   Acute respiratory failure with hypoxia ([place]) 02/07/2022   Depression 02/07/2022   A-fib ([place]) 02/07/2022   Seizure ([place]) 02/07/2022   Encephalopathy    CVA (cerebral vascular accident) ([place]) 02/04/2022   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   History of cervical cancer 02/01/2022   Severe aortic stenosis 01/17/2022   Tobacco abuse 01/17/2022   Osteopenia 03/11/2020   Olecranon bursitis of left elbow 11/26/2017   Essential hypertension, benign 11/20/2015   Hyperlipidemia LDL goal <130 11/20/2015   Hypothyroidism 11/20/2015   Generalized anxiety disorder 11/20/2015   Past Medical History:  Past Medical History:  Diagnosis Date   Anxiety    Atrial fibrillation ([place])    Cancer ([place]) 1979   cervical cancer   COPD (chronic obstructive pulmonary disease) ([facility])    Depression    DJD (degenerative joint disease)    History of cervical cancer    Hyperlipidemia    Hypertension    Hypothyroidism    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   s/p TAVR wtih a 45m Edwards S3UR via the right subclavian approach by Dr. CBurt Knackand Dr. BCyndia Bent  Severe aortic stenosis    Thyroid disease    Tobacco abuse    Past Surgical History:  Past Surgical History:  Procedure Laterality Date   AORTIC VALVE REPLACEMENT     APPENDECTOMY     pt recently had CT that said Appendix was there so she is unsure if this was actually removed during hysterectomy as she was previously told   BREAST ENHANCEMENT SURGERY     CARDIOVERSION N/A 10/27/2021   Procedure: CARDIOVERSION;  Surgeon: BArnoldo Lenis MD;  Location: [facility];  Service: Endoscopy;   Laterality: N/A;   INTRAOPERATIVE TRANSESOPHAGEAL ECHOCARDIOGRAM N/A 02/01/2022   Procedure: INTRAOPERATIVE TRANSESOPHAGEAL ECHOCARDIOGRAM;  Surgeon: CSherren Mocha MD;  [facility]: [facility]  Service: Open Heart Surgery;  Laterality: N/A;   RIGHT/LEFT HEART CATH AND CORONARY ANGIOGRAPHY N/A 11/22/2021   Procedure: RIGHT/LEFT HEART CATH AND CORONARY ANGIOGRAPHY;  Surgeon: CSherren Mocha MD;  [facility];  Service: Cardiovascular;  Laterality: N/A;   TONSILLECTOMY     VAGINAL HYSTERECTOMY     HPI:  71year old female with recent diagnosis of embolic stroke in the setting of persistent A-fib admitted 6/9-11, who was brought back with altered mental status and probable seizure-like activity. [facility] for close monitoring and continuous EEG. Pt with past medical history of anxiety, atrial fibrillation on Eliquis, COPD, depression, hyperlipidemia, hypertension, hypothyroidism, severe aortic stenosis status post TAVR 02/01/2022.   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  Pt was assessed informally using some stimuli from [employer].  Pt answered biographical Y/N questions with 100% accuracy, immediate evironment questions with 0% accuracy, and complex/comparison questions with 50% accuracy.  Pt follwing 1 step basic motor commands with 20% accuracy, improving to 40% accuracy with SLP model.  Hand over hand assistance was somewhat beneficial.  Pt completed confrontational naming task with 20% accuracy.  Pt benefited from semantic cue x1.  Pt was noted to have phonemic paraphasias ("cup" for "key").  Pt also observed  with some jargon "facteenth" when asked for a date.  Pt completed repetition task with 100% accuracy, including at sentence level.  Pt did not provide a spontaneous speech sample for picture description.  Her social language  was relatively well preserved in brief conversation on SLP arrival.    Pt's speech was clear without appreciable dysarthria in short utterances observed today.    Cognitive function was not assessed given severity of language deficits.  Inattention did appear to impact her performance today.  Pt required frequent cuing.  SLP covered hands with blanket as pulse ox proved distracting to patient.  Pt would likely benefit from cognitive assessment when communication has improved.    SLP will follow pt in house to address the above noted deficits and pt will likely required ongoing ST at next level of care.  Consider inpatient rehab referral.    SLP Assessment  SLP Recommendation/Assessment: Patient needs continued Speech Lanaguage Pathology Services SLP Visit Diagnosis: Aphasia (R47.01)    Recommendations for follow up therapy are one component of a multi-disciplinary discharge planning process, led by the attending physician.  Recommendations may be updated based on patient status, additional functional criteria and insurance authorization.    Follow Up Recommendations  Acute inpatient rehab (3hours/day)    Assistance Recommended at Discharge  Frequent or constant Supervision/Assistance  Functional Status Assessment Patient has had a recent decline in their functional status and demonstrates the ability to make significant improvements in function in a reasonable and predictable amount of time.  Frequency and Duration min 2x/week  2 weeks      SLP Evaluation Cognition  Overall Cognitive Status: Impaired/Different from baseline       Comprehension  Auditory Comprehension Overall Auditory Comprehension: Impaired Yes/No Questions: Impaired Basic Biographical Questions: 76-100% accurate Basic Immediate Environment Questions: 0-24% accurate Complex Questions: 25-49% accurate Commands: Impaired One Step Basic Commands: 25-49% accurate Interfering Components: Attention Visual  Recognition/Discrimination Discrimination: Not tested Reading Comprehension Reading Status: Not tested    Expression Expression Primary Mode of Expression: Verbal Verbal Expression Overall Verbal Expression: Impaired Initiation: Impaired Repetition: No impairment Naming: Impairment Confrontation: Impaired Verbal Errors: Phonemic paraphasias;Jargon Interfering Components: Attention   Oral / Motor  Motor Speech Overall Motor Speech: Appears within functional limits for tasks assessed Respiration: Within functional limits Phonation: Normal Resonance: Within functional limits Articulation: Within functional limitis Intelligibility: Intelligible Motor Planning: Witnin functional limits Motor Speech Errors: Not applicable            Celedonio Savage, [place], [place] Office: [phone] 02/08/2022, 12:27 PM

## 2022-02-08 NOTE — Progress Notes (Signed)
[place] Progress Note Patient Name: TAYLA PANOZZO DOB: 06-20-1951 MRN: [id number]   Date of Service  02/08/2022  HPI/Events of Note  Hypokalemia  Hypomagnesemia - K+ = 3.3, Mg++ = 1.8 and Creatinine = 0.57.  eICU Interventions  Will replace K+ and Mg++.     Intervention Category Major Interventions: Electrolyte abnormality - evaluation and management  Jester Klingberg [place] 02/08/2022, 4:27 AM

## 2022-02-08 NOTE — Progress Notes (Signed)
PT Cancellation Note  Patient Details Name: Tammie Martin MRN: [id number] DOB: 06/09/1951   Cancelled Treatment:    Reason Eval/Treat Not Completed: Patient at procedure or test/unavailable (pt with continuous EEG)   Yussuf Sawyers B [name] 02/08/2022, 10:27 AM [place] Office: [phone]

## 2022-02-08 NOTE — Procedures (Addendum)
Patient Name: Tammie Martin  MRN: [id number]  Epilepsy Attending: Lora Havens  Referring Physician/Provider: Jacky Kindle, MD Duration: 02/07/2022 1734 to 02/08/2022 1734  Patient history: 71 year old female with a PMH of afib on eliquis, TAVR, HTN, HLD presenting with seizure activity after a TAVR and subsequent stroke while she was off of her eliquis.  EEG to evaluate for seizure.  Level of alertness: Awake, asleep  AEDs during EEG study: LEV  Technical aspects: This EEG study was done with scalp electrodes positioned according to the 10-20 International system of electrode placement. Electrical activity was acquired at a sampling rate of '500Hz'$  and reviewed with a high frequency filter of '70Hz'$  and a low frequency filter of '1Hz'$ . EEG data were recorded continuously and digitally stored.   Description: The posterior dominant rhythm consists of 8 Hz activity of moderate voltage (25-35 uV) seen predominantly in posterior head regions, symmetric and reactive to eye opening and eye closing. Sleep was characterized by vertex waves, sleep spindles (12 to 14 Hz), maximal frontocentral region. EEG showed intermittent generalized and lateralized left hemisphere 3 to 6 Hz theta-delta slowing. Frequent spikes were noted in left posterior quadrant. Hyperventilation and photic stimulation were not performed.     One seizure without clinical sign was noted on a 02/08/2022 at 1104.  EEG showed spikes in left posterior quadrant admixed with 5 to 6 Hz theta slowing which gradually evolved into 2 to 3 Hz delta slowing and involves all of left hemisphere.  Seizure lasted for 2 minutes 20 seconds  ABNORMALITY - Seizure without clinical sign, left posterior quadrant - Spike, left posterior quadrant - Intermittent slow, generalized and lateralized left hemisphere  IMPRESSION: This study showed one seizure without clinical sign on 02/08/2022 at 1104 arising from left posterior quadrant, lasting about 2 minutes  20 seconds.  Additionally there is evidence of epileptogenicity arising from  left posterior quadrant.    There is also cortical dysfunction in left hemisphere which could be secondary to underlying stroke.  Additionally there is mild diffuse encephalopathy, nonspecific etiology.   [name]

## 2022-02-09 DIAGNOSIS — I10 Essential (primary) hypertension: Secondary | ICD-10-CM

## 2022-02-09 DIAGNOSIS — G9341 Metabolic encephalopathy: Secondary | ICD-10-CM | POA: Diagnosis not present

## 2022-02-09 DIAGNOSIS — E785 Hyperlipidemia, unspecified: Secondary | ICD-10-CM

## 2022-02-09 DIAGNOSIS — R569 Unspecified convulsions: Secondary | ICD-10-CM | POA: Diagnosis not present

## 2022-02-09 LAB — GLUCOSE, CAPILLARY: Glucose-Capillary: 127 mg/dL — ABNORMAL HIGH (ref 70–99)

## 2022-02-09 MED ORDER — HYDRALAZINE HCL 20 MG/ML IJ SOLN: 10.0000 mg | Freq: Four times a day (QID) | INTRAMUSCULAR | Status: DC | PRN

## 2022-02-09 MED ORDER — LEVETIRACETAM 500 MG PO TABS
1500.0000 mg | ORAL_TABLET | Freq: Two times a day (BID) | ORAL | Status: DC
  Administered 2022-02-09 – 2022-02-11 (×4): 1500 mg via ORAL
  Filled 2022-02-09 (×4): qty 3

## 2022-02-09 NOTE — Progress Notes (Signed)
Neurology Progress Note  Brief HPI: Tammie Martin is a 71 y.o. female A-fib on eliquis, HTN, HLD, COPD, left atrial clot, TAVR, and recent stroke. She was recently admitted 1/8-4/03 for embolic infarcts in the left frontal cortex, left posterior temporal cortex, and bilateral cerebellum related to a left atrial appendage thrombus. Anticoagulation was stopped in anticipation of her TAVR and then eliquis was resumed at discharge per cardiology recommendations. She presented to [facility] ED on 6/12 with AMS. An hour prior to her arrival in the ED she slumped over, had total body shaking, and became minimally responsive. At [facility] ED she had a witnessed tonic-clonic seizure that was stopped with 53m of Ativan. She was reported to be postictal with a GCS of 6. Per nursing she received 3g of Keppra [facility]   Subjective: Seen in room, still connected to LTM. She is awake and interactive today, but still having confusion. Able to take meds with applesauce, eliquis resumed overnight.   Exam: Vitals:   02/09/22 0320 02/09/22 0753  BP: 138/87   Pulse: 68   Resp: 14   Temp: 97.7 F (36.5 C) 97.6 F (36.4 C)  SpO2: 95%    Gen: In bed, NAD Resp: non-labored breathing, no acute distress Abd: soft, nt  NEURO:  Mental Status: Awake, alert, Aox1. Does not know place, date, or situation. Perseverates on previous question, difficulty following single step commands.  Cranial Nerves:  II: PERRL 3 mm/brisk. visual fields full.  III, IV, VI: EOMI. Lid elevation symmetric and full.  V: Sensation is intact to light touch and symmetrical to face. Blinks to threat. Moves jaw back and forth.  VII: Face is symmetric resting and smiling. Able to puff cheeks and raise eyebrows.  VIII: Hearing intact to voice IX, X: Palate elevation is symmetric. Phonation normal.  XI: Normal sternocleidomastoid and trapezius muscle strength XII: Tongue protrudes midline without fasciculations.   Motor:  Tone is normal. Bulk is normal. Moves all extremities antigravity. Will not hold extremities off the bed to commands Sensation: Withdraws to pain in all extremities Coordination: Does not follow commands to, no gross ataxia or tremor noted DTRs: 2+ throughout.  Gait: Deferred  Pertinent Labs: Alk Phos 106 AST/ALT 28/38 WBC 12.1 THC- positive  Imaging Reviewed: EEG- - Spike, left posterior quadrant - Intermittent slow, generalized and lateralized left hemisphere  MRI- Significantly motion limited study. Punctate foci of apparent restricted diffusion in the left fronta and right occipital lobes and right cerebellum could represent punctate acute infarcts or artifact.  Assessment: 71year old female with a PMH of afib on eliquis, TAVR, HTN, HLD presenting with seizure activity after a TAVR and subsequent stroke while she was off of her eliquis. Resume eliquis, continue keppra.   Impression:  Seizures  Recommendations: - Continue Keppra 15016mBID - Continue LTM for today  - Resume eliquis and continue home medications   Electronically signed by:  HuLynnae SandhoffMD Page: [id number]/14/2023, 7:57 AM

## 2022-02-09 NOTE — Progress Notes (Signed)
MB maintenance/checked F3 and FP1 electrodes. All impedances below 10k ohms.

## 2022-02-09 NOTE — TOC Initial Note (Signed)
Transition of Care [facility]) - Initial/Assessment Note    Patient Details  Name: Tammie Martin MRN: [id number] Date of Birth: 1951-04-01  Transition of Care [facility]) CM/SW Contact:    Carles Collet, RN Phone Number: 02/09/2022, 2:12 PM  Clinical Narrative:           Damaris Schooner w patient at bedside. She understands PT will work with her and make recommendations for DC. She would like to return home, she is not active w HH. We discussed setting up with [facility] she has initially declined, but is also aphasic and having some difficulty verbalizing needs.  She describes herself as independent prior to admission. She states that her sister helps her grocery shop.  She agreed for me to call her family. Spoke with her daughter, and she states that she is staying with her and plans to do so for as long as she needs.  Daughter states that her aunt is more of the primary caregiver than she is and asks that for discharge planning, her aunt would know more of what she needs. Informed her we will wait for recommendations from PT and be in touch.          Expected Discharge Plan: [address] Barriers to Discharge: Continued Medical Work up   Patient Goals and CMS Choice Patient states their goals for this hospitalization and ongoing recovery are:: return home      Expected Discharge Plan and Services Expected Discharge Plan: [place]   Discharge Planning Services: CM Consult   Living arrangements for the past 2 months: Apartment                                      Prior Living Arrangements/Services Living arrangements for the past 2 months: Apartment Lives with:: Self                   Activities of Daily Living      Permission Sought/Granted                  Emotional Assessment              Admission diagnosis:  Seizure ([place]) [R56.9] Encephalopathy [N36.14] Acute metabolic encephalopathy [E31.54] Patient Active Problem List    Diagnosis Date Noted   Acute metabolic encephalopathy 00/86/7619   Acute respiratory failure with hypoxia ([place]) 02/07/2022   Depression 02/07/2022   A-fib ([place]) 02/07/2022   Seizure ([place]) 02/07/2022   Encephalopathy    CVA (cerebral vascular accident) ([place]) 02/04/2022   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   History of cervical cancer 02/01/2022   Severe aortic stenosis 01/17/2022   Tobacco abuse 01/17/2022   Osteopenia 03/11/2020   Olecranon bursitis of left elbow 11/26/2017   Essential hypertension, benign 11/20/2015   Hyperlipidemia LDL goal <130 11/20/2015   Hypothyroidism 11/20/2015   Generalized anxiety disorder 11/20/2015   PCP:  Janora Norlander, DO Pharmacy:   [facility] [place], [address] [place] [address] Phone: [phone] Fax: [phone]     Social Determinants of Health (SDOH) Interventions    Readmission Risk Interventions    02/02/2022    1:58 PM  Readmission Risk Prevention Plan  Post Dischage Appt Complete  Medication Screening Complete  Transportation Screening Complete

## 2022-02-09 NOTE — Progress Notes (Signed)
OT Cancellation Note  Patient Details Name: DANELLY HASSINGER MRN: [id number] DOB: 1951/03/29   Cancelled Treatment:    Reason Eval/Treat Not Completed: Patient at procedure or test/ unavailable (pt on continuous EEG)  Malka So 02/09/2022, 2:15 PM Cleta Alberts, OTR/L Acute Rehabilitation Services Office: [phone]

## 2022-02-09 NOTE — Progress Notes (Signed)
PROGRESS NOTE        PATIENT DETAILS Name: Tammie Martin Age: 71 y.o. Sex: female Date of Birth: Jul 27, 1951 Admit Date: 02/07/2022 Admitting Physician Audria Nine, DO QIO:[email or web address], Koleen Distance, DO  Brief Summary: Patient is a 71 y.o.  female with recent TAVR on 6/06-subsequently hospitalized on 2/4-4/01 for acute embolic CVA-presented back to the ED on 6/12 with new onset seizures.  See below for further details.  Significant events: 6/6-6/7>> hospitalization for TAVR-Eliquis resumed on 6/7. 6/9-6/11>> hospitalization for -embolic appearing CVA on MRI brain.   6/12>> presented to [facility] with seizures-transferred to [facility] 6/14>> transfer to [facility].  Significant studies: 6/09>> CT angio head/neck: No LVO, moderate to severe left vertebral artery stenosis at origin, bilateral carotid artery atherosclerosis without greater than 50% stenosis. 6/09>> MRI brain: Acute infarct involving left frontal cortex/left posterior temporal cortex, bilateral cerebellum. 6/09>> A1c: 5.6 6/10>> EEG: Pending 6/10>> TSH: Normal limits 6/10>> ammonia: Not elevated 6/10>> vitamin B12: 837 6/12>> CT head: No acute intracranial abnormality 6/12>> CT angio head: No LVO or high-grade stenosis 6/12>> MRI brain: Multiple embolic infarcts in left frontal/right occipital lobes/right cerebellum 6/12-6/13>> LTM EEG: 1 seizure lasting 2 minutes 20 seconds, epileptogenicity arising from left posterior quadrant.   Significant microbiology data: 6/12>> COVID/influenza PCR: Negative  Procedures: 6/06>> TAVR  Consults: Neurology Cardiology PCCM  Subjective: Awake/alert-LTM EEG monitoring ongoing.  Follows commands  Objective: Vitals: Blood pressure (!) 161/104, pulse 98, temperature 97.6 F (36.4 C), temperature source Oral, resp. rate 20, height '5\' 7"'$  (1.702 m), weight 60 kg, SpO2 94 %.   Exam: Gen Exam:Alert awake-not in any distress HEENT:atraumatic,  normocephalic Chest: B/L clear to auscultation anteriorly CVS:S1S2 regular Abdomen:soft non tender, non distended Extremities:no edema Neurology: Non focal Skin: no rash   Pertinent Labs/Radiology:    Latest Ref Rng & Units 02/08/2022    2:01 AM 02/07/2022   12:49 AM 02/05/2022   12:49 AM  CBC  WBC 4.0 - 10.5 K/uL 12.1  12.5  12.9   Hemoglobin 12.0 - 15.0 g/dL 13.4  15.2  13.4   Hematocrit 36.0 - 46.0 % 39.8  44.5  39.6   Platelets 150 - 400 K/uL 142  186  154     Lab Results  Component Value Date   NA 138 02/08/2022   K 3.3 (L) 02/08/2022   CL 104 02/08/2022   CO2 23 02/08/2022      Assessment/Plan: Seizure disorder: In the setting of recent CVA-LTM EEG in progress-on Keppra-neurology following-await further recommendations.  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  Suspicion that she may have developed this thrombus when she was off Eliquis for her TAVR procedure.  Remains on Eliquis-extensive work-up including stroke MD evaluation done during her most recent hospitalization.  Acute metabolic encephalopathy: Resolved-likely due to postictal state/seizures.  She is relatively awake and alert today.  History of severe aortic stenosis-s/p TAVR 6/6: See above  Persistent atrial fibrillation: Continue telemetry monitoring-on Eliquis and bisoprolol.  Chronic HFpEF: Euvolemic on exam.  Hypokalemia: Recheck electrolytes tomorrow.  HTN: Resume low-dose bisoprolol-allow some amount of permissive hypertension.  Hypothyroidism: Continue Synthroid-TSH on 6/10 within normal limit.  COPD: Appears stable-continue bronchodilators  History of macular degeneration/retinal detachment: Vision at baseline-currently at her usual baseline.  Chronic cognitive dysfunction: At baseline-expect some amount of delirium  during this hospitalization.  Watch closely.  BMI: Estimated body mass index is 20.72 kg/m as calculated from the  following:   Height as of this encounter: '5\' 7"'$  (1.702 m).   Weight as of this encounter: 60 kg.   Code status:   Code Status: Full Code   DVT Prophylaxis:SCDs Start: 02/07/22 1011 apixaban (ELIQUIS) tablet 5 mg     Family Communication: Daughter-[phone]-updated over the phone on 6/14   Disposition Plan: Status is: Inpatient Remains inpatient appropriate because: Acute embolic CVA/encephalopathy-work-up in progress-not yet stable for discharge-see above.   Planned Discharge Destination:Home health   Diet: Diet Order             DIET SOFT Room service appropriate? Yes; Fluid consistency: Thin  Diet effective now                     Antimicrobial agents: Anti-infectives (From admission, onward)    None        MEDICATIONS: Scheduled Meds:  apixaban  5 mg Oral BID   atorvastatin  40 mg Oral Daily   bisoprolol  15 mg Oral Daily   Chlorhexidine Gluconate Cloth  6 each Topical Q0600   ipratropium  0.5 mg Nebulization BID   levETIRAcetam  1,500 mg Oral BID   levothyroxine  88 mcg Oral QAC breakfast   Continuous Infusions:   PRN Meds:.acetaminophen **OR** acetaminophen (TYLENOL) oral liquid 160 mg/5 mL **OR** acetaminophen, albuterol, docusate sodium, lip balm, LORazepam, ondansetron (ZOFRAN) IV, polyethylene glycol, senna-docusate   I have personally reviewed following labs and imaging studies  LABORATORY DATA: CBC: Recent Labs  Lab 02/04/22 1250 02/05/22 0049 02/07/22 0049 02/08/22 0201  WBC 11.6* 12.9* 12.5* 12.1*  NEUTROABS  --   --  8.1*  --   HGB 13.5 13.4 15.2* 13.4  HCT 39.5 39.6 44.5 39.8  MCV 96.6 96.1 94.3 95.4  PLT 152 154 186 142*     Basic Metabolic Panel: Recent Labs  Lab 02/05/22 0049 02/05/22 0953 02/06/22 0319 02/07/22 0049 02/08/22 0201  NA 134* 136 137 134* 138  K 3.7 4.0 3.8 3.5 3.3*  CL 98 99 102 101 104  CO2 '25 25 26 25 23  '$ GLUCOSE 119* 112* 107* 151* 81  BUN 6* 8 13 24* 12  CREATININE 0.48 0.57  0.58 0.65 0.57  CALCIUM 8.9 9.2 9.1 8.9 8.5*  MG  --   --   --   --  1.8  PHOS  --   --   --   --  3.4     GFR: Estimated Creatinine Clearance: 59.3 mL/min (by C-G formula based on SCr of 0.57 mg/dL).  Liver Function Tests: Recent Labs  Lab 02/04/22 1250 02/05/22 0953 02/06/22 0319 02/07/22 0049 02/08/22 0201  AST 49* 33 34 48* 28  ALT 74* 56* 51* 61* 38  ALKPHOS 145* 142* 130* 138* 106  BILITOT 1.1 1.2 1.2 1.2 0.9  PROT 7.0 6.9 6.4* 7.3 6.1*  ALBUMIN 3.3* 3.3* 3.0* 3.5 2.8*    No results for input(s): "LIPASE", "AMYLASE" in the last 168 hours. Recent Labs  Lab 02/05/22 0049  AMMONIA 23     Coagulation Profile: Recent Labs  Lab 02/04/22 1302 02/07/22 0049  INR 1.1 1.4*     Cardiac Enzymes: No results for input(s): "CKTOTAL", "CKMB", "CKMBINDEX", "TROPONINI" in the last 168 hours.  BNP (last 3 results) No results for input(s): "PROBNP" in the last 8760 hours.  Lipid Profile: No results for input(s): "CHOL", "HDL", "  [place]", "TRIG", "CHOLHDL", "LDLDIRECT" in the last 72 hours.  Thyroid Function Tests: No results for input(s): "TSH", "T4TOTAL", "FREET4", "T3FREE", "THYROIDAB" in the last 72 hours.   Anemia Panel: No results for input(s): "VITAMINB12", "FOLATE", "FERRITIN", "TIBC", "IRON", "RETICCTPCT" in the last 72 hours.   Urine analysis:    Component Value Date/Time   COLORURINE YELLOW 02/07/2022 1219   APPEARANCEUR HAZY (A) 02/07/2022 1219   LABSPEC >1.046 (H) 02/07/2022 1219   PHURINE 6.0 02/07/2022 [place] 02/07/2022 1219   HGBUR NEGATIVE 02/07/2022 1219   BILIRUBINUR NEGATIVE 02/07/2022 1219   KETONESUR 5 (A) 02/07/2022 1219   PROTEINUR 30 (A) 02/07/2022 1219   NITRITE NEGATIVE 02/07/2022 1219   LEUKOCYTESUR TRACE (A) 02/07/2022 1219    Sepsis Labs: Lactic Acid, Venous No results found for: "LATICACIDVEN"  MICROBIOLOGY: Recent Results (from the past 240 hour(s))  Resp Panel by RT-PCR (Flu A&B, Covid) Anterior  Nasal Swab     Status: None   Collection Time: 02/07/22  1:14 AM   Specimen: Anterior Nasal Swab  Result Value Ref Range Status   SARS Coronavirus 2 by RT PCR NEGATIVE NEGATIVE Final    Comment: (NOTE) SARS-CoV-2 target nucleic acids are NOT DETECTED.  The SARS-CoV-2 RNA is generally detectable in upper respiratory specimens during the acute phase of infection. The lowest concentration of SARS-CoV-2 viral copies this assay can detect is 138 copies/mL. A negative result does not preclude SARS-Cov-2 infection and should not be used as the sole basis for treatment or other patient management decisions. A negative result may occur with  improper specimen collection/handling, submission of specimen other than nasopharyngeal swab, presence of viral mutation(s) within the areas targeted by this assay, and inadequate number of viral copies(<138 copies/mL). A negative result must be combined with clinical observations, patient history, and epidemiological information. The expected result is Negative.  Fact Sheet for Patients:  EntrepreneurPulse.com.au  Fact Sheet for Healthcare Providers:  IncredibleEmployment.be  This test is no t yet approved or cleared by the [place] FDA and  has been authorized for detection and/or diagnosis of SARS-CoV-2 by FDA under an Emergency Use Authorization (EUA). This EUA will remain  in effect (meaning this test can be used) for the duration of the COVID-19 declaration under Section 564(b)(1) of the Act, 21 U.S.C.section 360bbb-3(b)(1), unless the authorization is terminated  or revoked sooner.       Influenza A by PCR NEGATIVE NEGATIVE Final   Influenza B by PCR NEGATIVE NEGATIVE Final    Comment: (NOTE) The Xpert Xpress SARS-CoV-2/FLU/RSV plus assay is intended as an aid in the diagnosis of influenza from Nasopharyngeal swab specimens and should not be used as a sole basis for treatment. Nasal washings  and aspirates are unacceptable for Xpert Xpress SARS-CoV-2/FLU/RSV testing.  Fact Sheet for Patients: EntrepreneurPulse.com.au  Fact Sheet for Healthcare Providers: IncredibleEmployment.be  This test is not yet approved or cleared by the [place] FDA and has been authorized for detection and/or diagnosis of SARS-CoV-2 by FDA under an Emergency Use Authorization (EUA). This EUA will remain in effect (meaning this test can be used) for the duration of the COVID-19 declaration under Section 564(b)(1) of the Act, 21 U.S.C. section 360bbb-3(b)(1), unless the authorization is terminated or revoked.  Performed at [facility], [address], [place], [place] [address]   MRSA Next Gen by PCR, Nasal     Status: None   Collection Time: 02/07/22  9:57 AM   Specimen: Nasal Mucosa; Nasal Swab  Result  Value Ref Range Status   MRSA by PCR Next Gen NOT DETECTED NOT DETECTED Final    Comment: (NOTE) The GeneXpert MRSA Assay (FDA approved for NASAL specimens only), is one component of a comprehensive MRSA colonization surveillance program. It is not intended to diagnose MRSA infection nor to guide or monitor treatment for MRSA infections. Test performance is not FDA approved in patients less than 22 years old. Performed at [facility], [place] [address] [place], [place] [address]     RADIOLOGY STUDIES/RESULTS: Overnight EEG with video  Result Date: 02/08/2022 Lora Havens, MD     02/09/2022  8:49 AM Patient Name: Tammie Martin MRN: [id number] Epilepsy Attending: Lora Havens Referring Physician/Provider: Jacky Kindle, MD Duration: 02/07/2022 1734 to 02/08/2022 1734 Patient history: 71 year old female with a PMH of afib on eliquis, TAVR, HTN, HLD presenting with seizure activity after a TAVR and subsequent stroke while she was off of her eliquis.  EEG to evaluate for seizure. Level of alertness: Awake, asleep AEDs during EEG study: LEV  Technical aspects: This EEG study was done with scalp electrodes positioned according to the 10-20 International system of electrode placement. Electrical activity was acquired at a sampling rate of '500Hz'$  and reviewed with a high frequency filter of '70Hz'$  and a low frequency filter of '1Hz'$ . EEG data were recorded continuously and digitally stored. Description: The posterior dominant rhythm consists of 8 Hz activity of moderate voltage (25-35 uV) seen predominantly in posterior head regions, symmetric and reactive to eye opening and eye closing. Sleep was characterized by vertex waves, sleep spindles (12 to 14 Hz), maximal frontocentral region. EEG showed intermittent generalized and lateralized left hemisphere 3 to 6 Hz theta-delta slowing. Frequent spikes were noted in left posterior quadrant. Hyperventilation and photic stimulation were not performed.   One seizure without clinical sign was noted on a 02/08/2022 at 1104.  EEG showed spikes in left posterior quadrant admixed with 5 to 6 Hz theta slowing which gradually evolved into 2 to 3 Hz delta slowing and involves all of left hemisphere.  Seizure lasted for 2 minutes 20 seconds ABNORMALITY - Seizure without clinical sign, left posterior quadrant - Spike, left posterior quadrant - Intermittent slow, generalized and lateralized left hemisphere IMPRESSION: This study showed one seizure without clinical sign on 02/08/2022 at 1104 arising from left posterior quadrant, lasting about 2 minutes 20 seconds.  Additionally there is evidence of epileptogenicity arising from  left posterior quadrant.  There is also cortical dysfunction in left hemisphere which could be secondary to underlying stroke.  Additionally there is mild diffuse encephalopathy, nonspecific etiology. Lora Havens   MR BRAIN WO CONTRAST  Result Date: 02/07/2022 CLINICAL DATA:  Neuro deficit, acute, stroke suspected EXAM: MRI HEAD WITHOUT CONTRAST TECHNIQUE: Multiplanar, multiecho pulse sequences of  the brain and surrounding structures were obtained without intravenous contrast. COMPARISON:  CT head February 07, 2022. FINDINGS: Significantly motion limited study. Brain: Punctate foci of apparent restricted diffusion in the left frontal (series 3, image 30) and right occipital (series 3, image 23) lobes and right cerebellum (series 3, image 15). No evidence of acute hemorrhage, mass lesion, midline shift, hydrocephalus, or extra-axial fluid collection. Patchy T2/FLAIR hyperintensities in white matter, nonspecific but compatible with moderate chronic microvascular ischemic disease. Vascular: Major arterial flow voids are maintained the skull base. Skull and upper cervical spine: Normal marrow signal. Sinuses/Orbits: Clear sinuses.  No acute orbital findings. Other: No mastoid effusions. IMPRESSION: 1. Significantly motion limited study. Punctate  foci of apparent restricted diffusion in the left fronta and right occipital lobes and right cerebellum could represent punctate acute infarcts or artifact. 2. Moderate chronic microvascular ischemic disease. Electronically Signed   By: Margaretha Sheffield M.D.   On: 02/07/2022 16:28     LOS: 2 days   Oren Binet, MD  [facility]    To contact the attending provider between 7A-7P or the covering provider during after hours 7P-7A, please log into the web site [email or web address] and access using universal [place] password for that web site. If you do not have the password, please call the hospital operator.  02/09/2022, 11:52 AM

## 2022-02-09 NOTE — Progress Notes (Signed)
PT Cancellation Note  Patient Details Name: Tammie Martin MRN: [id number] DOB: 03-15-1951   Cancelled Treatment:    Reason Eval/Treat Not Completed: Patient at procedure or test/unavailable.  Has been in EEG tx for AM and now PM, so will reattempt when pt and time allows.   Ramond Dial 02/09/2022, 2:15 PM  Mee Hives, PT PhD Acute Rehab Dept. Number: [place]

## 2022-02-09 NOTE — Progress Notes (Signed)
LTM EEG maintenance complete. No skin breakdown noted under FP1, FP2 and F7

## 2022-02-09 NOTE — Procedures (Addendum)
Patient Name: Tammie Martin  MRN: [id number]  Epilepsy Attending: Lora Havens  Referring Physician/Provider: Jacky Kindle, MD Duration: 02/08/2022 1734 to 02/09/2022 1734   Patient history: 71 year old female with a PMH of afib on eliquis, TAVR, HTN, HLD presenting with seizure activity after a TAVR and subsequent stroke while she was off of her eliquis.  EEG to evaluate for seizure.   Level of alertness: Awake, asleep   AEDs during EEG study: LEV   Technical aspects: This EEG study was done with scalp electrodes positioned according to the 10-20 International system of electrode placement. Electrical activity was acquired at a sampling rate of '500Hz'$  and reviewed with a high frequency filter of '70Hz'$  and a low frequency filter of '1Hz'$ . EEG data were recorded continuously and digitally stored.    Description: The posterior dominant rhythm consists of 8 Hz activity of moderate voltage (25-35 uV) seen predominantly in posterior head regions, symmetric and reactive to eye opening and eye closing. Sleep was characterized by vertex waves, sleep spindles (12 to 14 Hz), maximal frontocentral region. EEG showed intermittent generalized and lateralized left hemisphere 3 to 6 Hz theta-delta slowing. Spikes were noted in left posterior quadrant. Hyperventilation and photic stimulation were not performed.      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 There is also cortical dysfunction in left hemisphere which could be secondary to underlying stroke.  Additionally there is mild diffuse encephalopathy, nonspecific etiology.  No definite seizures were seen during the study.   Graysyn Bache Barbra Sarks

## 2022-02-09 NOTE — Consult Note (Signed)
   [facility] Inpatient Consult   02/09/2022  [name] 1951/08/17 [id number]  [facility] [[facility]] Patient: Tammie Martin  Primary Care Provider:  Janora Norlander, DO, is an embedded provider with a Chronic Care Management team and program, and is listed for the transition of care follow up and appointments.  Less than 30 days readmission   Patient was screened for Embedded practice service needs for chronic care management  Plan: Continue to follow for Embedded Care Management follow with inpatient [facility] team for post hospital needs.   Please contact for further questions,  Natividad Brood, RN BSN [facility] Liaison  [phone] business mobile phone Toll free office [phone]  Fax number: [phone] [email or web address]

## 2022-02-10 DIAGNOSIS — I10 Essential (primary) hypertension: Secondary | ICD-10-CM | POA: Diagnosis not present

## 2022-02-10 DIAGNOSIS — R569 Unspecified convulsions: Secondary | ICD-10-CM | POA: Diagnosis not present

## 2022-02-10 DIAGNOSIS — G9341 Metabolic encephalopathy: Secondary | ICD-10-CM | POA: Diagnosis not present

## 2022-02-10 DIAGNOSIS — I4819 Other persistent atrial fibrillation: Secondary | ICD-10-CM | POA: Diagnosis not present

## 2022-02-10 DIAGNOSIS — E785 Hyperlipidemia, unspecified: Secondary | ICD-10-CM | POA: Diagnosis not present

## 2022-02-10 LAB — COMPREHENSIVE METABOLIC PANEL
ALT: 34 U/L (ref 0–44)
AST: 29 U/L (ref 15–41)
Albumin: 2.9 g/dL — ABNORMAL LOW (ref 3.5–5.0)
Alkaline Phosphatase: 116 U/L (ref 38–126)
Anion gap: 8 (ref 5–15)
BUN: 19 mg/dL (ref 8–23)
CO2: 28 mmol/L (ref 22–32)
Calcium: 8.4 mg/dL — ABNORMAL LOW (ref 8.9–10.3)
Chloride: 99 mmol/L (ref 98–111)
Creatinine, Ser: 0.62 mg/dL (ref 0.44–1.00)
GFR, Estimated: >60 mL/min (ref >60)
Glucose, Bld: 114 mg/dL — ABNORMAL HIGH (ref 70–99)
Potassium: 3.4 mmol/L — ABNORMAL LOW (ref 3.5–5.1)
Sodium: 135 mmol/L (ref 135–145)
Total Bilirubin: 0.7 mg/dL (ref 0.3–1.2)
Total Protein: 6.1 g/dL — ABNORMAL LOW (ref 6.5–8.1)

## 2022-02-10 LAB — CBC
HCT: 39.6 % (ref 36.0–46.0)
Hemoglobin: 13.4 g/dL (ref 12.0–15.0)
MCH: 32 pg (ref 26.0–34.0)
MCHC: 33.8 g/dL (ref 30.0–36.0)
MCV: 94.5 fL (ref 80.0–100.0)
Platelets: 175 10*3/uL (ref 150–400)
RBC: 4.19 MIL/uL (ref 3.87–5.11)
RDW: 12.8 % (ref 11.5–15.5)
WBC: 11.2 10*3/uL — ABNORMAL HIGH (ref 4.0–10.5)
nRBC: 0 % (ref 0.0–0.2)

## 2022-02-10 LAB — MAGNESIUM: Magnesium: 2 mg/dL (ref 1.7–2.4)

## 2022-02-10 MED ORDER — POTASSIUM CHLORIDE CRYS ER 20 MEQ PO TBCR
40.0000 meq | EXTENDED_RELEASE_TABLET | Freq: Once | ORAL | Status: AC
  Administered 2022-02-10: 40 meq via ORAL
  Filled 2022-02-10: qty 2

## 2022-02-10 NOTE — Care Management Important Message (Signed)
Important Message  Patient Details  Name: Tammie Martin MRN: [id number] Date of Birth: 1951/08/17   Medicare Important Message Given:  Yes     Orbie Pyo 02/10/2022, 2:21 PM

## 2022-02-10 NOTE — Progress Notes (Signed)
PROGRESS NOTE        PATIENT DETAILS Name: Tammie Martin Age: 71 y.o. Sex: female Date of Birth: 21-Oct-1950 Admit Date: 02/07/2022 Admitting Physician Audria Nine, DO UMP:[id number], Koleen Distance, DO  Brief Summary: Patient is a 71 y.o.  female with recent TAVR on 6/06-subsequently hospitalized on 0/0-8/67 for acute embolic CVA-presented back to the ED on 6/12 with new onset seizures.  See below for further details.  Significant events: 6/6-6/7>> hospitalization for TAVR-Eliquis resumed on 6/7. 6/9-6/11>> hospitalization for -embolic appearing CVA on MRI brain.   6/12>> presented to [facility] ED with seizures-transferred to [facility] ICU 6/14>> transfer to [facility].  Significant studies: 6/09>> CT angio head/neck: No LVO, moderate to severe left vertebral artery stenosis at origin, bilateral carotid artery atherosclerosis without greater than 50% stenosis. 6/09>> MRI brain: Acute infarct involving left frontal cortex/left posterior temporal cortex, bilateral cerebellum. 6/09>> A1c: 5.6 6/10>> EEG: Pending 6/10>> TSH: Normal limits 6/10>> ammonia: Not elevated 6/10>> vitamin B12: 837 6/12>> CT head: No acute intracranial abnormality 6/12>> CT angio head: No LVO or high-grade stenosis 6/12>> MRI brain: Multiple embolic infarcts in left frontal/right occipital lobes/right cerebellum 6/12-6/13>> LTM EEG: 1 seizure lasting 2 minutes 20 seconds, epileptogenicity arising from left posterior quadrant. 6/14-6/15>> LTM EEG: No seizures-epileptogenicity arising from the left posterior quadrant.   Significant microbiology data: 6/12>> COVID/influenza PCR: Negative  Procedures: 6/06>> TAVR  Consults: Neurology Cardiology PCCM  Subjective: Awake/alert-no major issues overnight.  LTM EEG ongoing.  Objective: Vitals: Blood pressure 136/90, pulse 84, temperature 97.6 F (36.4 C), temperature source Oral, resp. rate 20, height '5\' 7"'$  (1.702 m), weight 53.2 kg, SpO2 93 %.    Exam: Gen Exam:Alert awake-not in any distress HEENT:atraumatic, normocephalic Chest: B/L clear to auscultation anteriorly CVS:S1S2 regular Abdomen:soft non tender, non distended Extremities:no edema Neurology: Non focal Skin: no rash   Pertinent Labs/Radiology:    Latest Ref Rng & Units 02/10/2022    1:04 AM 02/08/2022    2:01 AM 02/07/2022   12:49 AM  CBC  WBC 4.0 - 10.5 K/uL 11.2  12.1  12.5   Hemoglobin 12.0 - 15.0 g/dL 13.4  13.4  15.2   Hematocrit 36.0 - 46.0 % 39.6  39.8  44.5   Platelets 150 - 400 K/uL 175  142  186     Lab Results  Component Value Date   NA 135 02/10/2022   K 3.4 (L) 02/10/2022   CL 99 02/10/2022   CO2 28 02/10/2022      Assessment/Plan: Seizure disorder: In the setting of recent CVA-LTM EEG in progress-on Keppra-neurology following-await further recommendations.  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  Suspicion that she may have developed this thrombus when she was off Eliquis for her TAVR procedure.  Remains on Eliquis-extensive work-up including stroke MD evaluation done during her most recent hospitalization.  Acute metabolic encephalopathy: Resolved-likely due to postictal state/seizures.  She is relatively awake and alert today.  She has some amount of cognitive dysfunction at baseline-maintain delirium precautions.  History of severe aortic stenosis-s/p TAVR 6/6: See above  Persistent atrial fibrillation: Continue telemetry monitoring-on Eliquis and bisoprolol.  Chronic HFpEF: Euvolemic on exam.  Hypokalemia: Replete and recheck.  HTN: BP stable-continue bisoprolol.  Hypothyroidism: Continue Synthroid-TSH on 6/10 within normal limit.  COPD: Appears stable-continue bronchodilators  History of macular  degeneration/retinal detachment: Vision at baseline-currently at her usual baseline.  Chronic cognitive dysfunction: At baseline-expect some amount of delirium during this  hospitalization.  Watch closely.  BMI: Estimated body mass index is 18.37 kg/m as calculated from the following:   Height as of this encounter: '5\' 7"'$  (1.702 m).   Weight as of this encounter: 53.2 kg.   Code status:   Code Status: Full Code   DVT Prophylaxis:SCDs Start: 02/07/22 1011 apixaban (ELIQUIS) tablet 5 mg     Family Communication: Daughter-Jennifer-[phone]-updated over the phone on 6/14   Disposition Plan: Status is: Inpatient Remains inpatient appropriate because: Acute embolic CVA/encephalopathy-work-up in progress-not yet stable for discharge-see above.  LTM EEG ongoing.   Planned Discharge Destination:Home health   Diet: Diet Order             DIET SOFT Room service appropriate? Yes; Fluid consistency: Thin  Diet effective now                     Antimicrobial agents: Anti-infectives (From admission, onward)    None        MEDICATIONS: Scheduled Meds:  apixaban  5 mg Oral BID   atorvastatin  40 mg Oral Daily   bisoprolol  15 mg Oral Daily   Chlorhexidine Gluconate Cloth  6 each Topical Q0600   ipratropium  0.5 mg Nebulization BID   levETIRAcetam  1,500 mg Oral BID   levothyroxine  88 mcg Oral QAC breakfast   Continuous Infusions:   PRN Meds:.acetaminophen **OR** acetaminophen (TYLENOL) oral liquid 160 mg/5 mL **OR** acetaminophen, albuterol, docusate sodium, hydrALAZINE, lip balm, LORazepam, ondansetron (ZOFRAN) IV, polyethylene glycol, senna-docusate   I have personally reviewed following labs and imaging studies  LABORATORY DATA: CBC: Recent Labs  Lab 02/04/22 1250 02/05/22 0049 02/07/22 0049 02/08/22 0201 02/10/22 0104  WBC 11.6* 12.9* 12.5* 12.1* 11.2*  NEUTROABS  --   --  8.1*  --   --   HGB 13.5 13.4 15.2* 13.4 13.4  HCT 39.5 39.6 44.5 39.8 39.6  MCV 96.6 96.1 94.3 95.4 94.5  PLT 152 154 186 142* 175     Basic Metabolic Panel: Recent Labs  Lab 02/05/22 0953 02/06/22 0319 02/07/22 0049 02/08/22 0201  02/10/22 0104  NA 136 137 134* 138 135  K 4.0 3.8 3.5 3.3* 3.4*  CL 99 102 101 104 99  CO2 '25 26 25 23 28  '$ GLUCOSE 112* 107* 151* 81 114*  BUN 8 13 24* 12 19  CREATININE 0.57 0.58 0.65 0.57 0.62  CALCIUM 9.2 9.1 8.9 8.5* 8.4*  MG  --   --   --  1.8 2.0  PHOS  --   --   --  3.4  --      GFR: Estimated Creatinine Clearance: 52.6 mL/min (by C-G formula based on SCr of 0.62 mg/dL).  Liver Function Tests: Recent Labs  Lab 02/05/22 0953 02/06/22 0319 02/07/22 0049 02/08/22 0201 02/10/22 0104  AST 33 34 48* 28 29  ALT 56* 51* 61* 38 34  ALKPHOS 142* 130* 138* 106 116  BILITOT 1.2 1.2 1.2 0.9 0.7  PROT 6.9 6.4* 7.3 6.1* 6.1*  ALBUMIN 3.3* 3.0* 3.5 2.8* 2.9*    No results for input(s): "LIPASE", "AMYLASE" in the last 168 hours. Recent Labs  Lab 02/05/22 0049  AMMONIA 23     Coagulation Profile: Recent Labs  Lab 02/04/22 1302 02/07/22 0049  INR 1.1 1.4*     Cardiac Enzymes: No results for input(s): "CKTOTAL", "  CKMB", "CKMBINDEX", "TROPONINI" in the last 168 hours.  BNP (last 3 results) No results for input(s): "PROBNP" in the last 8760 hours.  Lipid Profile: No results for input(s): "CHOL", "HDL", "LDLCALC", "TRIG", "CHOLHDL", "LDLDIRECT" in the last 72 hours.  Thyroid Function Tests: No results for input(s): "TSH", "T4TOTAL", "FREET4", "T3FREE", "THYROIDAB" in the last 72 hours.   Anemia Panel: No results for input(s): "VITAMINB12", "FOLATE", "FERRITIN", "TIBC", "IRON", "RETICCTPCT" in the last 72 hours.   Urine analysis:    Component Value Date/Time   COLORURINE YELLOW 02/07/2022 1219   APPEARANCEUR HAZY (A) 02/07/2022 1219   LABSPEC >1.046 (H) 02/07/2022 1219   PHURINE 6.0 02/07/2022 [place] 02/07/2022 1219   HGBUR NEGATIVE 02/07/2022 1219   BILIRUBINUR NEGATIVE 02/07/2022 1219   KETONESUR 5 (A) 02/07/2022 1219   PROTEINUR 30 (A) 02/07/2022 1219   NITRITE NEGATIVE 02/07/2022 1219   LEUKOCYTESUR TRACE (A) 02/07/2022 1219     Sepsis Labs: Lactic Acid, Venous No results found for: "LATICACIDVEN"  MICROBIOLOGY: Recent Results (from the past 240 hour(s))  Resp Panel by RT-PCR (Flu A&B, Covid) Anterior Nasal Swab     Status: None   Collection Time: 02/07/22  1:14 AM   Specimen: Anterior Nasal Swab  Result Value Ref Range Status   SARS Coronavirus 2 by RT PCR NEGATIVE NEGATIVE Final    Comment: (NOTE) SARS-CoV-2 target nucleic acids are NOT DETECTED.  The SARS-CoV-2 RNA is generally detectable in upper respiratory specimens during the acute phase of infection. The lowest concentration of SARS-CoV-2 viral copies this assay can detect is 138 copies/mL. A negative result does not preclude SARS-Cov-2 infection and should not be used as the sole basis for treatment or other patient management decisions. A negative result may occur with  improper specimen collection/handling, submission of specimen other than nasopharyngeal swab, presence of viral mutation(s) within the areas targeted by this assay, and inadequate number of viral copies(<138 copies/mL). A negative result must be combined with clinical observations, patient history, and epidemiological information. The expected result is Negative.  Fact Sheet for Patients:  EntrepreneurPulse.com.au  Fact Sheet for Healthcare Providers:  IncredibleEmployment.be  This test is no t yet approved or cleared by the [place] FDA and  has been authorized for detection and/or diagnosis of SARS-CoV-2 by FDA under an Emergency Use Authorization (EUA). This EUA will remain  in effect (meaning this test can be used) for the duration of the COVID-19 declaration under Section 564(b)(1) of the Act, 21 U.S.C.section 360bbb-3(b)(1), unless the authorization is terminated  or revoked sooner.       Influenza A by PCR NEGATIVE NEGATIVE Final   Influenza B by PCR NEGATIVE NEGATIVE Final    Comment: (NOTE) The Xpert Xpress  SARS-CoV-2/FLU/RSV plus assay is intended as an aid in the diagnosis of influenza from Nasopharyngeal swab specimens and should not be used as a sole basis for treatment. Nasal washings and aspirates are unacceptable for Xpert Xpress SARS-CoV-2/FLU/RSV testing.  Fact Sheet for Patients: EntrepreneurPulse.com.au  Fact Sheet for Healthcare Providers: IncredibleEmployment.be  This test is not yet approved or cleared by the [place] FDA and has been authorized for detection and/or diagnosis of SARS-CoV-2 by FDA under an Emergency Use Authorization (EUA). This EUA will remain in effect (meaning this test can be used) for the duration of the COVID-19 declaration under Section 564(b)(1) of the Act, 21 U.S.C. section 360bbb-3(b)(1), unless the authorization is terminated or revoked.  Performed at [facility], [address], [place], [place]  [address]   MRSA Next Gen by PCR, Nasal     Status: None   Collection Time: 02/07/22  9:57 AM   Specimen: Nasal Mucosa; Nasal Swab  Result Value Ref Range Status   MRSA by PCR Next Gen NOT DETECTED NOT DETECTED Final    Comment: (NOTE) The GeneXpert MRSA Assay (FDA approved for NASAL specimens only), is one component of a comprehensive MRSA colonization surveillance program. It is not intended to diagnose MRSA infection nor to guide or monitor treatment for MRSA infections. Test performance is not FDA approved in patients less than 31 years old. Performed at [facility], [place] [address] [place], [place] [address]     RADIOLOGY STUDIES/RESULTS: No results found.   LOS: 3 days   Oren Binet, MD  [facility]    To contact the attending provider between 7A-7P or the covering provider during after hours 7P-7A, please log into the web site [email or web address] and access using universal [place] password for that web site. If you do not have the password, please call the hospital  operator.  02/10/2022, 1:48 PM

## 2022-02-10 NOTE — Plan of Care (Signed)
  Problem: Education: Goal: Knowledge of General Education information will improve Description: Including pain rating scale, medication(s)/side effects and non-pharmacologic comfort measures Outcome: Progressing   Problem: Health Behavior/Discharge Planning: Goal: Ability to manage health-related needs will improve Outcome: Progressing   Problem: Clinical Measurements: Goal: Ability to maintain clinical measurements within normal limits will improve Outcome: Progressing Goal: Will remain free from infection Outcome: Progressing Goal: Diagnostic test results will improve Outcome: Progressing Goal: Respiratory complications will improve Outcome: Progressing Goal: Cardiovascular complication will be avoided Outcome: Progressing   Problem: Activity: Goal: Risk for activity intolerance will decrease Outcome: Progressing   Problem: Nutrition: Goal: Adequate nutrition will be maintained Outcome: Progressing   Problem: Coping: Goal: Level of anxiety will decrease Outcome: Progressing   Problem: Elimination: Goal: Will not experience complications related to bowel motility Outcome: Progressing Goal: Will not experience complications related to urinary retention Outcome: Progressing   Problem: Safety: Goal: Ability to remain free from injury will improve Outcome: Progressing   Problem: Skin Integrity: Goal: Risk for impaired skin integrity will decrease Outcome: Progressing   Problem: Coping: Goal: Will verbalize positive feelings about self Outcome: Progressing Goal: Will identify appropriate support needs Outcome: Progressing   Problem: Health Behavior/Discharge Planning: Goal: Ability to manage health-related needs will improve Outcome: Progressing   Problem: Self-Care: Goal: Ability to participate in self-care as condition permits will improve Outcome: Progressing Goal: Verbalization of feelings and concerns over difficulty with self-care will improve Outcome:  Progressing Goal: Ability to communicate needs accurately will improve Outcome: Progressing   Problem: Nutrition: Goal: Risk of aspiration will decrease Outcome: Progressing Goal: Dietary intake will improve Outcome: Progressing   Problem: Ischemic Stroke/TIA Tissue Perfusion: Goal: Complications of ischemic stroke/TIA will be minimized Outcome: Progressing

## 2022-02-10 NOTE — Progress Notes (Signed)
EEG D/C no skin breakdown, [facility] notified

## 2022-02-10 NOTE — Evaluation (Signed)
Occupational Therapy Evaluation Patient Details Name: Tammie Martin MRN: [id number] DOB: Oct 04, 1950 Today's Date: 02/10/2022   History of Present Illness 71 y.o. female admitted 6/12 with AMS and seizure with encephalopathy. 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   Clinical Impression   PTA, pt lives alone and reports typically Independent in ADLs, mobility and majority of IADL tasks.Unsure of PLOF accuracy as pt with confusion and expressive aphasia. Pt with continuous EEG today as well, so limited OT eval to bedside to assess BSC transfer abilities d/t poor understanding of purewick. Pt requires min guard for sit to stand transfers and Min A for ADLs. Pt with noted difficulty in word finding, motor planning, and sequencing. Pt able to follow commands consistently when related to functional tasks. Will continue to follow acutely to finalize DC recs. Noted per chart, pt has family that can assist her at DC. Rec OP OT vs HHOT pending progress and transportation availability at DC.      Recommendations for follow up therapy are one component of a multi-disciplinary discharge planning process, led by the attending physician.  Recommendations may be updated based on patient status, additional functional criteria and insurance authorization.   Follow Up Recommendations  Outpatient OT (vs HHOT pending transportation availability)    Assistance Recommended at Discharge Frequent or constant Supervision/Assistance  Patient can return home with the following A little help with bathing/dressing/bathroom;Assistance with cooking/housework;Direct supervision/assist for medications management;Direct supervision/assist for financial management;Assist for  transportation;Help with stairs or ramp for entrance    Functional Status Assessment  Patient has had a recent decline in their functional status and demonstrates the ability to make significant improvements in function in a reasonable and predictable amount of time.  Equipment Recommendations  None recommended by OT    Recommendations for Other Services       Precautions / Restrictions Precautions Precautions: Fall;Other (comment) Restrictions Weight Bearing Restrictions: No      Mobility Bed Mobility Overal bed mobility: Modified Independent                  Transfers Overall transfer level: Needs assistance Equipment used: None Transfers: Sit to/from Stand Sit to Stand: Min guard           General transfer comment: mild sway though no overt LOB      Balance Overall balance assessment: Needs assistance Sitting-balance support: No upper extremity supported, Feet supported Sitting balance-Leahy Scale: Good     Standing balance support: Single extremity supported, During functional activity Standing balance-Leahy Scale: Fair Standing balance comment: able to stand without assist and without AD, limitations noted with dynamic tasks                           ADL either performed or assessed with clinical judgement   ADL Overall ADL's : Needs assistance/impaired Eating/Feeding: Supervision/ safety;Sitting   Grooming: Min guard;Standing;Wash/dry hands Grooming Details (indicate cue type and reason): pt reaching to sink from bed for cleanser spray to spray on hand in standing with mild unsteadiness Upper Body Bathing: Standing;Min guard   Lower Body Bathing: Minimal assistance;Sit to/from stand   Upper Body Dressing : Min guard;Sitting   Lower Body Dressing: Minimal assistance;Sit to/from stand   Toilet Transfer: Minimal assistance;Stand-pivot   Toileting-  Clothing Manipulation and Hygiene: Minimal assistance;Sit to/from stand          General ADL Comments: Limited to bedside with cont EEG ongoing (for a few days now). Pt still able to stand with light assist, limited more by cognitive and aphasia deficits     Vision Ability to See in Adequate Light: 1 Impaired Patient Visual Report: Other (comment) (difficult to determine) Vision Assessment?: Vision impaired- to be further tested in functional context Additional Comments: noted per chart, pt with baseline visual deficits though pt denies any issues other than mild blurriness. when using cleanser spray on hands, pt noted to spray in wrong direction, reports unable to see where spray comes out     Perception     Praxis      Pertinent Vitals/Pain Pain Assessment Pain Assessment: No/denies pain     Hand Dominance Right (presumed d/t use of R hand predominatly during task)   Extremity/Trunk Assessment Upper Extremity Assessment Upper Extremity Assessment: RUE deficits/detail;LUE deficits/detail RUE Deficits / Details: AROM WFL, motor planning deficits as unable to complete finger to nose test without max cues and hand over hand demo RUE Coordination: decreased fine motor;decreased gross motor LUE Deficits / Details: AROM WFL LUE Coordination: decreased fine motor;decreased gross motor   Lower Extremity Assessment Lower Extremity Assessment: Defer to PT evaluation   Cervical / Trunk Assessment Cervical / Trunk Assessment: Normal   Communication Communication Communication: Expressive difficulties   Cognition Arousal/Alertness: Awake/alert Behavior During Therapy: WFL for tasks assessed/performed Overall Cognitive Status: Impaired/Different from baseline Area of Impairment: Orientation, Following commands, Awareness, Safety/judgement, Problem solving, Memory                 Orientation Level: Disoriented to, Place, Time, Situation   Memory: Decreased short-term memory Following Commands: Follows one step commands consistently Safety/Judgement:  Decreased awareness of safety, Decreased awareness of deficits Awareness: Intellectual Problem Solving: Slow processing, Difficulty sequencing, Requires verbal cues, Requires tactile cues General Comments: Pt pleasant, conversive. Noted difficulties in word finding and maintaining train of though but when given choices, able to correctly identify choices. aware of being in the hospital but reports she is at [facility] folllows commands relatively consistently when attached to a functional task. pt able to confirm MD recs to avoid driving for 3 months without prompting     General Comments  Discussed with RNs pt difficulty with purewick understanding, decided to opt on bedside eval only to assess ability for [facility] transfers    Exercises     Shoulder Instructions      Home Living Family/patient expects to be discharged to:: Private residence Living Arrangements: Alone Available Help at Discharge: Family Type of Home: Apartment                           Additional Comments: reports staying in apartment (converted from old school building?) Difficulty providing info d/t expressive aphasia      Prior Functioning/Environment Prior Level of Function : Independent/Modified Independent;Patient poor historian/Family not available             Mobility Comments: no use of AD, pt reports typically able to move fast and independently          OT Problem List: Decreased strength;Decreased activity tolerance;Impaired balance (sitting and/or standing);Decreased coordination;Impaired vision/perception;Decreased cognition;Decreased safety awareness      OT Treatment/Interventions: Self-care/ADL training;Therapeutic exercise;Energy conservation;DME and/or AE instruction;Therapeutic activities;Patient/family education;Balance training    OT Goals(Current goals can be found in  the care plan section) Acute Rehab OT Goals Patient Stated Goal: be able to move around OOB OT Goal  Formulation: With patient Time For Goal Achievement: 02/24/22 Potential to Achieve Goals: Good ADL Goals Pt Will Perform Lower Body Dressing: with modified independence;sit to/from stand Pt Will Transfer to Toilet: with modified independence;ambulating Additional ADL Goal #1: Pt to accurately name 5/5 ADL items without verbal cues Additional ADL Goal #2: Pt to maintain attention to functional tasks > 5 min with no more than min verbal cues  OT Frequency: Min 2X/week    Co-evaluation              AM-PAC OT "6 Clicks" Daily Activity     Outcome Measure Help from another person eating meals?: A Little Help from another person taking care of personal grooming?: A Little Help from another person toileting, which includes using toliet, bedpan, or urinal?: A Little Help from another person bathing (including washing, rinsing, drying)?: A Little Help from another person to put on and taking off regular upper body clothing?: A Little Help from another person to put on and taking off regular lower body clothing?: A Little 6 Click Score: 18   End of Session Nurse Communication: Mobility status  Activity Tolerance: Patient tolerated treatment well Patient left: in bed;with call bell/phone within reach;with bed alarm set  OT Visit Diagnosis: Unsteadiness on feet (R26.81);Other abnormalities of gait and mobility (R26.89);Other symptoms and signs involving cognitive function                Time: 1610-9604 OT Time Calculation (min): 31 min Charges:  OT General Charges $OT Visit: 1 Visit OT Evaluation $OT Eval Moderate Complexity: 1 Mod OT Treatments $Therapeutic Activity: 8-22 mins  Malachy Chamber, OTR/L Acute Rehab Services Office: [phone]   Layla Maw 02/10/2022, 8:18 AM

## 2022-02-10 NOTE — Progress Notes (Signed)
Neurology Progress Note  Brief HPI: Tammie Martin is a 71 y.o. female A-fib on eliquis, HTN, HLD, COPD, left atrial clot, TAVR, and recent stroke. She was recently admitted 0/2-1/11 for embolic infarcts in the left frontal cortex, left posterior temporal cortex, and bilateral cerebellum related to a left atrial appendage thrombus. Anticoagulation was stopped in anticipation of her TAVR and then eliquis was resumed at discharge per cardiology recommendations. She presented to [facility] ED on 6/12 with AMS. An hour prior to her arrival in the ED she slumped over, had total body shaking, and became minimally responsive. At [facility] ED she had a witnessed tonic-clonic seizure that was stopped with 27m of Ativan. She was reported to be postictal with a GCS of 6. Per nursing she received 3g of Keppra [name]   Subjective: Patient is seen in her room with no family at the bedside.  She reports no acute events overnight.  She is oriented x3 but endorses some forgetfulness.  LTM EEG shows no seizure activity.   Exam: Vitals:   02/10/22 0620 02/10/22 0809  BP:  (!) 143/83  Pulse: 79 85  Resp: 19 20  Temp: 97.6 F (36.4 C) (!) 97.5 F (36.4 C)  SpO2: 92% 93%   Gen: In bed, NAD Resp: non-labored breathing, no acute distress Abd: soft, nt  NEURO:  Mental Status: Awake, alert, Aox3 and able to follow single step commands.  Some difficulty with multiple step commands Cranial Nerves:  II: PERRL 3 mm/brisk. visual fields full.  III, IV, VI: EOMI. Lid elevation symmetric and full.  V: Sensation is intact to light touch and symmetrical to face.  VII: Face is symmetric resting and smiling.  VIII: Hearing intact to voice IX, X: Phonation normal.  XII: Tongue protrudes midline without fasciculations.   Motor: Tone is normal. Bulk is normal. Moves all extremities antigravity. No drift in BUE and BLE Sensation: Intact to light touch Coordination: FNF intact bilaterally, difficulty  following commands for HKS DTRs: 2+ throughout.  Gait: Deferred  Pertinent Labs: Alk Phos 116 AST/ALT 29/34 WBC 11.2 THC- positive  Imaging Reviewed: EEG- No seizure activity overnight  MRI- Significantly motion limited study. Punctate foci of apparent restricted diffusion in the left frontal and right occipital lobes and right cerebellum could represent punctate acute infarcts or artifact.  Assessment: 71year old female with a PMH of afib on eliquis, TAVR, HTN, HLD presenting with seizure activity after a TAVR and subsequent stroke while she was off of her eliquis. Resume eliquis, continue keppra. Will discontinue LTM EEG and keep Keppra at current dose.  Neurology will remain available on an as needed basis.  Impression:  Seizures  Recommendations: - Continue Keppra 150101mBID - Discontinue LTM EEG - Continue Eliquis and home medications  [place] MSN, AGACNP-BC [facility] See Amion for schedule and pager information 02/10/2022 8:29 AM

## 2022-02-10 NOTE — TOC Progression Note (Signed)
Transition of Care [facility]) - Progression Note    Patient Details  Name: Tammie Martin MRN: [id number] Date of Birth: Apr 02, 1951  Transition of [facility] CM/SW Contact  Zenon Mayo, RN Phone Number: 02/10/2022, 3:46 PM  Clinical Narrative:    Per PT eval rec outpatient physical therapy, NCM made referral to outpatient physical therapy at [facility] outpatient rehab in [place].  TOC will continue to follow for dc needs.    Expected Discharge Plan: [place] Barriers to Discharge: Continued Medical Work up  Expected Discharge Plan and Services Expected Discharge Plan: [place]   Discharge Planning Services: CM Consult   Living arrangements for the past 2 months: Apartment                                       Social Determinants of Health (SDOH) Interventions    Readmission Risk Interventions    02/02/2022    1:58 PM  Readmission Risk Prevention Plan  Post Dischage Appt Complete  Medication Screening Complete  Transportation Screening Complete

## 2022-02-10 NOTE — Progress Notes (Signed)
Patient is alert and verbally responsive, expressive aphasia noted. Makes most needs known, others are anticipated and met. PT ambulated patient today. She is in bed at this time, call bell is within patients reach. No seizure activity observed today.

## 2022-02-10 NOTE — Procedures (Addendum)
Patient Name: Tammie Martin  MRN: [id number]  Epilepsy Attending: Lora Havens  Referring Physician/Provider: Jacky Kindle, MD Duration: 02/09/2022 1734 to 02/10/2022 1020   Patient history: 71 year old female with a PMH of afib on eliquis, TAVR, HTN, HLD presenting with seizure activity after a TAVR and subsequent stroke while she was off of her eliquis.  EEG to evaluate for seizure.   Level of alertness: Awake, asleep   AEDs during EEG study: LEV   Technical aspects: This EEG study was done with scalp electrodes positioned according to the 10-20 International system of electrode placement. Electrical activity was acquired at a sampling rate of '500Hz'$  and reviewed with a high frequency filter of '70Hz'$  and a low frequency filter of '1Hz'$ . EEG data were recorded continuously and digitally stored.    Description: The posterior dominant rhythm consists of 8 Hz activity of moderate voltage (25-35 uV) seen predominantly in posterior head regions, symmetric and reactive to eye opening and eye closing. Sleep was characterized by vertex waves, sleep spindles (12 to 14 Hz), maximal frontocentral region. EEG showed intermittent generalized and lateralized left hemisphere 3 to 6 Hz theta-delta slowing. Spikes were noted in left posterior quadrant. Hyperventilation and photic stimulation were not performed.      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 There is also cortical dysfunction in left hemisphere which could be secondary to underlying stroke.  Additionally there is mild diffuse encephalopathy, nonspecific etiology.  No definite seizures were seen during the study.  EEG appears similar to previous day.   Demarques Pilz Barbra Sarks

## 2022-02-10 NOTE — Evaluation (Signed)
Physical Therapy Evaluation Patient Details Name: Tammie Martin MRN: [id number] DOB: 04-06-1951 Today's Date: 02/10/2022  History of Present Illness  71 y.o. female admitted 6/12 with AMS and seizure with encephalopathy. 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  Clinical Impression   Pt admitted secondary to problem above with deficits below. PTA patient was walking without assist but with supervision provided by daughter.  Pt currently requires min assist with tendency to drift to her right and even run into objects on her right (blind in rt eye previously). Per records, she has expressive aphasia and is difficult to truly assess her cognition. She will need min assist any time she is up on her feet.  Anticipate patient will benefit from PT to address problems listed below.Will continue to follow acutely to maximize functional mobility independence and safety.          Recommendations for follow up therapy are one component of a multi-disciplinary discharge planning process, led by the attending physician.  Recommendations may be updated based on patient status, additional functional criteria and insurance authorization.  Follow Up Recommendations Outpatient PT    Assistance Recommended at Discharge Frequent or constant Supervision/Assistance (due to decr cognition and language)  Patient can return home with the following  A little help with walking and/or transfers;Assistance with cooking/housework;Direct supervision/assist for medications management;Direct supervision/assist for financial management;Help with stairs or ramp for entrance    Equipment Recommendations None recommended by PT  Recommendations for Other Services       Functional  Status Assessment Patient has had a recent decline in their functional status and demonstrates the ability to make significant improvements in function in a reasonable and predictable amount of time.     Precautions / Restrictions Precautions Precautions: Fall;Other (comment) Precaution Comments: blind in rt eye; decr vision left Restrictions Weight Bearing Restrictions: No      Mobility  Bed Mobility Overal bed mobility: Modified Independent                  Transfers Overall transfer level: Needs assistance Equipment used: None Transfers: Sit to/from Stand Sit to Stand: Min guard           General transfer comment: no imbalance with several transfers    Ambulation/Gait Ambulation/Gait assistance: Min assist Gait Distance (Feet): 400 Feet Assistive device: None Gait Pattern/deviations: Drifts right/left   Gait velocity interpretation: >2.62 ft/sec, indicative of community ambulatory   General Gait Details: consistently drifting to her right; can correct on cues to "follow the black tiles and stay in the middle of the hall" for ~10 ft and then begins to drift again; Nearly ran straight into a sign that was slightly to the right of midline  Stairs            Wheelchair Mobility    Modified Rankin (Stroke Patients Only) Modified Rankin (Stroke Patients Only) Pre-Morbid Rankin Score: No symptoms Modified Rankin: Moderately severe disability     Balance Overall balance assessment: Needs assistance Sitting-balance support: No upper extremity supported, Feet supported Sitting balance-Leahy Scale: Good     Standing balance support: During functional activity, No upper extremity supported Standing balance-Leahy Scale: Fair Standing balance comment: able to stand without assist and without AD, limitations noted with dynamic tasks  Pertinent Vitals/Pain Pain Assessment Pain Assessment: Faces Faces Pain Scale: No  hurt    Home Living Family/patient expects to be discharged to:: Private residence Living Arrangements: Alone Available Help at Discharge: Family Type of Home: Apartment         Home Layout: One level   Additional Comments: reports staying in apartment (converted from old school building?) Difficulty providing info d/t expressive aphasia    Prior Function Prior Level of Function : Independent/Modified Independent;Patient poor historian/Family not available             Mobility Comments: no use of AD, pt reports typically able to move fast and independently       Hand Dominance   Dominant Hand: Right (presumed d/t use of R hand predominatly during task)    Extremity/Trunk Assessment   Upper Extremity Assessment Upper Extremity Assessment: Defer to OT evaluation    Lower Extremity Assessment Lower Extremity Assessment: Generalized weakness    Cervical / Trunk Assessment Cervical / Trunk Assessment: Normal  Communication   Communication: Expressive difficulties  Cognition Arousal/Alertness: Awake/alert Behavior During Therapy: WFL for tasks assessed/performed Overall Cognitive Status: Difficult to assess                                 General Comments: pt unable to state location, but when told hospital she replies "oh, yes"; denies blindness rt eye, but almost running into objects on her right--?cognition vs expressive aphasia (which is documented)        General Comments General comments (skin integrity, edema, etc.): Sister present and inquiring re: AIR for additional therapies prior to return home.    Exercises     Assessment/Plan    PT Assessment Patient needs continued PT services  PT Problem List Decreased mobility;Decreased safety awareness;Cardiopulmonary status limiting activity;Decreased balance;Decreased knowledge of use of DME;Decreased knowledge of precautions       PT Treatment Interventions Gait training;Functional mobility  training;Therapeutic activities;Balance training;Neuromuscular re-education;Cognitive remediation;Patient/family education;DME instruction;Therapeutic exercise    PT Goals (Current goals can be found in the Care Plan section)  Acute Rehab PT Goals Patient Stated Goal: unable to state PT Goal Formulation: Patient unable to participate in goal setting Time For Goal Achievement: 02/24/22 Potential to Achieve Goals: Good    Frequency Min 4X/week     Co-evaluation               AM-PAC PT "6 Clicks" Mobility  Outcome Measure Help needed turning from your back to your side while in a flat bed without using bedrails?: None Help needed moving from lying on your back to sitting on the side of a flat bed without using bedrails?: None Help needed moving to and from a bed to a chair (including a wheelchair)?: A Little Help needed standing up from a chair using your arms (e.g., wheelchair or bedside chair)?: A Little Help needed to walk in hospital room?: A Little Help needed climbing 3-5 steps with a railing? : A Little 6 Click Score: 20    End of Session Equipment Utilized During Treatment: Gait belt Activity Tolerance: Patient tolerated treatment well Patient left: in bed;with call bell/phone within reach;with bed alarm set;with family/visitor present Nurse Communication: Mobility status PT Visit Diagnosis: Unsteadiness on feet (R26.81);Difficulty in walking, not elsewhere classified (R26.2)    Time: 1007-1219 PT Time Calculation (min) (ACUTE ONLY): 20 min   Charges:   PT Evaluation $PT Eval Moderate Complexity: 1 Mod  Arby Barrette, PT Acute Rehabilitation Services  Office [phone]   Rexanne Mano 02/10/2022, 2:40 PM

## 2022-02-10 NOTE — Progress Notes (Signed)
PT Cancellation Note  Patient Details Name: ADAMA IVINS MRN: [id number] DOB: 06-23-1951   Cancelled Treatment:    Reason Eval/Treat Not Completed: Patient at procedure or test/unavailable  Patient still on EEG. Unable to do full gait and balance assessment therefore will defer until EEG completed/removed.    [facility]  Office [phone]  Rexanne Mano 02/10/2022, 8:09 AM

## 2022-02-11 ENCOUNTER — Other Ambulatory Visit (HOSPITAL_COMMUNITY): Payer: Self-pay

## 2022-02-11 ENCOUNTER — Ambulatory Visit: Payer: Medicare HMO

## 2022-02-11 DIAGNOSIS — G9341 Metabolic encephalopathy: Secondary | ICD-10-CM | POA: Diagnosis not present

## 2022-02-11 LAB — BASIC METABOLIC PANEL
Anion gap: 10 (ref 5–15)
BUN: 14 mg/dL (ref 8–23)
CO2: 29 mmol/L (ref 22–32)
Calcium: 9.1 mg/dL (ref 8.9–10.3)
Chloride: 99 mmol/L (ref 98–111)
Creatinine, Ser: 0.67 mg/dL (ref 0.44–1.00)
GFR, Estimated: >60 mL/min (ref >60)
Glucose, Bld: 113 mg/dL — ABNORMAL HIGH (ref 70–99)
Potassium: 4.2 mmol/L (ref 3.5–5.1)
Sodium: 138 mmol/L (ref 135–145)

## 2022-02-11 MED ORDER — LEVETIRACETAM 750 MG PO TABS
1500.0000 mg | ORAL_TABLET | Freq: Two times a day (BID) | ORAL | 0 refills | Status: DC
  Filled 2022-02-11: qty 60, 15d supply, fill #0

## 2022-02-11 NOTE — Discharge Instructions (Signed)
Do not drive, operate heavy machinery, perform activities at heights, swimming or participation in water activities or provide baby sitting services until you have seen by the Neurologist and advised to do so again.  Follow with Primary MD Janora Norlander, DO in 7 days   Get CBC, CMP, 2 view Chest X ray -  checked next visit within 1 week by Primary MD   Activity: As tolerated with Full fall precautions use walker/cane & assistance as needed  Disposition Home   Diet: Heart Healthy    Special Instructions: If you have smoked or chewed Tobacco  in the last 2 yrs please stop smoking, stop any regular Alcohol  and or any Recreational drug use.  On your next visit with your primary care physician please Get Medicines reviewed and adjusted.  Please request your Prim.MD to go over all Hospital Tests and Procedure/Radiological results at the follow up, please get all Hospital records sent to your Prim MD by signing hospital release before you go home.  If you experience worsening of your admission symptoms, develop shortness of breath, life threatening emergency, suicidal or homicidal thoughts you must seek medical attention immediately by calling 911 or calling your MD immediately  if symptoms less severe.  You Must read complete instructions/literature along with all the possible adverse reactions/side effects for all the Medicines you take and that have been prescribed to you. Take any new Medicines after you have completely understood and accpet all the possible adverse reactions/side effects.

## 2022-02-11 NOTE — Plan of Care (Signed)
  Problem: Education: Goal: Knowledge of General Education information will improve Description: Including pain rating scale, medication(s)/side effects and non-pharmacologic comfort measures Outcome: Adequate for Discharge   Problem: Health Behavior/Discharge Planning: Goal: Ability to manage health-related needs will improve Outcome: Adequate for Discharge   Problem: Clinical Measurements: Goal: Ability to maintain clinical measurements within normal limits will improve Outcome: Adequate for Discharge Goal: Will remain free from infection Outcome: Adequate for Discharge Goal: Diagnostic test results will improve Outcome: Adequate for Discharge Goal: Respiratory complications will improve Outcome: Adequate for Discharge Goal: Cardiovascular complication will be avoided Outcome: Adequate for Discharge   Problem: Nutrition: Goal: Adequate nutrition will be maintained Outcome: Adequate for Discharge   Problem: Activity: Goal: Risk for activity intolerance will decrease Outcome: Adequate for Discharge   Problem: Coping: Goal: Level of anxiety will decrease Outcome: Adequate for Discharge   Problem: Pain Managment: Goal: General experience of comfort will improve Outcome: Adequate for Discharge   Problem: Safety: Goal: Ability to remain free from injury will improve Outcome: Adequate for Discharge   Problem: Education: Goal: Knowledge of disease or condition will improve Outcome: Adequate for Discharge Goal: Knowledge of secondary prevention will improve (SELECT ALL) Outcome: Adequate for Discharge Goal: Knowledge of patient specific risk factors will improve (INDIVIDUALIZE FOR PATIENT) Outcome: Adequate for Discharge Goal: Individualized Educational Video(s) Outcome: Adequate for Discharge   Problem: Skin Integrity: Goal: Risk for impaired skin integrity will decrease Outcome: Adequate for Discharge   Problem: Coping: Goal: Will verbalize positive feelings about  self Outcome: Adequate for Discharge Goal: Will identify appropriate support needs Outcome: Adequate for Discharge   Problem: Health Behavior/Discharge Planning: Goal: Ability to manage health-related needs will improve Outcome: Adequate for Discharge   Problem: Self-Care: Goal: Ability to participate in self-care as condition permits will improve Outcome: Adequate for Discharge Goal: Verbalization of feelings and concerns over difficulty with self-care will improve Outcome: Adequate for Discharge Goal: Ability to communicate needs accurately will improve Outcome: Adequate for Discharge   Problem: Nutrition: Goal: Risk of aspiration will decrease Outcome: Adequate for Discharge Goal: Dietary intake will improve Outcome: Adequate for Discharge   Problem: Ischemic Stroke/TIA Tissue Perfusion: Goal: Complications of ischemic stroke/TIA will be minimized Outcome: Adequate for Discharge   Problem: Education: Goal: Expressions of having a comfortable level of knowledge regarding the disease process will increase Outcome: Adequate for Discharge

## 2022-02-11 NOTE — TOC Transition Note (Signed)
Transition of Care [facility]) - CM/SW Discharge Note   Patient Details  Name: Tammie Martin MRN: [id number] Date of Birth: 1950-10-29  [facility] CM/SW Contact:  Zenon Mayo, RN Phone Number: 02/11/2022, 12:02 PM   Clinical Narrative:    Patient is for dc today, sister is in the room and will be transporting her home today. NCM informed sister that she will have outpatient pt in [place] and to give them a call to set up apt times.      Barriers to Discharge: Continued Medical Work up   Patient Goals and CMS Choice Patient states their goals for this hospitalization and ongoing recovery are:: return home      Discharge Placement                       Discharge Plan and Services   Discharge Planning Services: CM Consult                                 Social Determinants of Health (SDOH) Interventions     Readmission Risk Interventions    02/02/2022    1:58 PM  Readmission Risk Prevention Plan  Post Dischage Appt Complete  Medication Screening Complete  Transportation Screening Complete

## 2022-02-11 NOTE — Discharge Summary (Signed)
Tammie Martin DDU:[id number] DOB: [birth year] DOA: 02/07/2022  PCP: Janora Norlander, DO  Admit date: 02/07/2022  Discharge date: 02/11/2022  Admitted From: Home   Disposition:  Home   Recommendations for Outpatient Follow-up:   Follow up with PCP in 1-2 weeks  PCP Please obtain BMP/CBC, 2 view CXR in 1week,  (see Discharge instructions)   PCP Please follow up on the following pending results:    Home Health: PT   Equipment/Devices: None  Consultations: Neuro, Cards, PCCM Discharge Condition: Stable    CODE STATUS: Full    Diet Recommendation: Heart Healthy   Chief Complaint  Patient presents with   Altered Mental Status     Brief history of present illness from the day of admission and additional interim summary     70 y.o.  female with recent TAVR on 6/06-subsequently hospitalized on 2/3-7/62 for acute embolic CVA-presented back to the ED on 6/12 with new onset seizures.  See below for further details.   Significant events: 6/6-6/7>> hospitalization for TAVR-Eliquis resumed on 6/7. 6/9-6/11>> hospitalization for -embolic appearing CVA on MRI brain.   6/12>> presented to [facility] ED with seizures-transferred to [facility] ICU 6/14>> transfer to [facility].   Significant studies: 6/09>> CT angio head/neck: No LVO, moderate to severe left vertebral artery stenosis at origin, bilateral carotid artery atherosclerosis without greater than 50% stenosis. 6/09>> MRI brain: Acute infarct involving left frontal cortex/left posterior temporal cortex, bilateral cerebellum. 6/09>> A1c: 5.6 6/10>> EEG: Pending 6/10>> TSH: Normal limits 6/10>> ammonia: Not elevated 6/10>> vitamin B12: 837 6/12>> CT head: No acute intracranial abnormality 6/12>> CT angio head: No LVO or high-grade stenosis 6/12>> MRI brain: Multiple embolic  infarcts in left frontal/right occipital lobes/right cerebellum 6/12-6/13>> LTM EEG: 1 seizure lasting 2 minutes 20 seconds, epileptogenicity arising from left posterior quadrant. 6/14-6/15>> LTM EEG: No seizures-epileptogenicity arising from the left posterior quadrant.                                                                  Hospital Course   Seizure disorder: In the setting of recent CVA-placed on high-dose Keppra by neurology now cleared, her last EEG unremarkable.  Will be discharged home with seizure precautions and outpatient neurology and PCP follow-up.  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  Suspicion that she may have developed this thrombus when she was off Eliquis for her TAVR procedure.  Remains on Eliquis-extensive work-up including stroke MD evaluation done during her most recent hospitalization.   Acute metabolic encephalopathy: Resolved-likely due to postictal state/seizures.  She is relatively awake and alert today.  She has some amount of cognitive dysfunction at baseline-maintain delirium precautions.   History of severe aortic stenosis-s/p TAVR 6/6: See above   Persistent atrial fibrillation:  Continue telemetry monitoring-on Eliquis and bisoprolol.   Chronic HFpEF: Euvolemic on exam.  HTN: BP stable-continue bisoprolol.  Hypothyroidism: Continue Synthroid-TSH on 6/10 within normal limit.   COPD: Appears stable-continue bronchodilators   History of macular degeneration/retinal detachment: Vision at baseline-currently at her usual baseline.   Chronic cognitive dysfunction: At baseline- now plan is to discharge home with home health.  Discharge diagnosis     Principal Problem:   Acute metabolic encephalopathy Active Problems:   Essential hypertension, benign   Hyperlipidemia LDL goal <130   Hypothyroidism   Acute respiratory failure with hypoxia (HCC)   Depression   A-fib (HCC)   Seizure (HCC)    Encephalopathy    Discharge instructions    Discharge Instructions     Diet - low sodium heart healthy   Complete by: As directed    Discharge instructions   Complete by: As directed    Do not drive, operate heavy machinery, perform activities at heights, swimming or participation in water activities or provide baby sitting services until you have seen by the Neurologist and advised to do so again.  Follow with Primary MD Janora Norlander, DO in 7 days   Get CBC, CMP, 2 view Chest X ray -  checked next visit within 1 week by Primary MD   Activity: As tolerated with Full fall precautions use walker/cane & assistance as needed  Disposition Home   Diet: Heart Healthy    Special Instructions: If you have smoked or chewed Tobacco  in the last 2 yrs please stop smoking, stop any regular Alcohol  and or any Recreational drug use.  On your next visit with your primary care physician please Get Medicines reviewed and adjusted.  Please request your Prim.MD to go over all Hospital Tests and Procedure/Radiological results at the follow up, please get all Hospital records sent to your Prim MD by signing hospital release before you go home.  If you experience worsening of your admission symptoms, develop shortness of breath, life threatening emergency, suicidal or homicidal thoughts you must seek medical attention immediately by calling 911 or calling your MD immediately  if symptoms less severe.  You Must read complete instructions/literature along with all the possible adverse reactions/side effects for all the Medicines you take and that have been prescribed to you. Take any new Medicines after you have completely understood and accpet all the possible adverse reactions/side effects.   Increase activity slowly   Complete by: As directed    No wound care   Complete by: As directed        Discharge Medications   Allergies as of 02/11/2022       Reactions   Penicillins Hives    Singulair [montelukast Sodium]    headache   Codeine Rash   Nicoderm [nicotine] Hives, Swelling   Other Rash   Band-aids cause rash after a few days        Medication List     TAKE these medications    acetaminophen 500 MG tablet Commonly known as: TYLENOL Take 1,000 mg by mouth every 6 (six) hours as needed for moderate pain.   albuterol 108 (90 Base) MCG/ACT inhaler Commonly known as: VENTOLIN HFA Inhale 2 puffs into the lungs every 6 (six) hours as needed for wheezing or shortness of breath.   atorvastatin 20 MG tablet Commonly known as: LIPITOR Take 2 tablets (40 mg total) by mouth daily.   atorvastatin 40 MG tablet Commonly known as: LIPITOR Take 40 mg by  mouth daily.   bisoprolol 10 MG tablet Commonly known as: ZEBETA Take 1.5 tablets (15 mg total) by mouth daily.   CALCIUM-MAGNESIUM PO Take 1 tablet by mouth daily.   cetirizine 10 MG tablet Commonly known as: ZYRTEC Take 10 mg by mouth daily.   desvenlafaxine 50 MG 24 hr tablet Commonly known as: Pristiq Take 1 tablet (50 mg total) by mouth daily.   Eliquis 5 MG Tabs tablet Generic drug: apixaban TAKE ONE TABLET BY MOUTH TWICE DAILY   fluticasone 50 MCG/ACT nasal spray Commonly known as: FLONASE Place 2 sprays into both nostrils daily as needed for allergies or rhinitis.   furosemide 20 MG tablet Commonly known as: LASIX TAKE ONE TABLET BY MOUTH DAILY   hydrocortisone cream 1 % Apply 1 application. topically 2 (two) times daily as needed (rash).   levETIRAcetam 750 MG tablet Commonly known as: KEPPRA Take 2 tablets (1,500 mg total) by mouth 2 (two) times daily.   levothyroxine 88 MCG tablet Commonly known as: SYNTHROID Take 1 tablet by mouth daily. What changed: when to take this   Omega-3-6-9 Caps Take 1 capsule by mouth 2 (two) times daily.   potassium chloride 10 MEQ tablet Commonly known as: KLOR-CON M Take 1 tablet (10 mEq total) by mouth daily.   PRESERVISION AREDS 2 PO Take 1  capsule by mouth in the morning and at bedtime.   ALIVE WOMENS 50+ PO Take 1 tablet by mouth daily.   PROBIOTIC ADVANCED PO Take 1 capsule by mouth daily.   SYSTANE OP Place 1 drop into both eyes daily as needed (dry eyes).   traZODone 50 MG tablet Commonly known as: DESYREL Take 0.5-1 tablets (25-50 mg total) by mouth at bedtime as needed for sleep. What changed: how much to take   TURMERIC PO Take 1 capsule by mouth 2 (two) times daily.   umeclidinium-vilanterol 62.5-25 MCG/ACT Aepb Commonly known as: ANORO ELLIPTA Inhale 1 puff into the lungs daily at 6 (six) AM.   Vitamin D 50 MCG (2000 UT) tablet Take 2,000 Units by mouth daily.         Follow-up Information     [facility] Follow up.   Specialty: Rehabilitation Why: outpatient physical therapy Contact information: [address] [id number] [place] [address] [place], [place], DO. Schedule an appointment as soon as possible for a visit in 1 week(s).   Specialty: Family Medicine Contact information: [address] [place] [address] [phone]         Arnoldo Lenis, MD .   Specialty: Cardiology Contact information: [place] [address] [phone]         [place]. Schedule an appointment as soon as possible for a visit in 2 week(s).   Why: seizures, CVA Contact information: [address]     [place] [address] [phone]                Major procedures and Radiology Reports - PLEASE review detailed and final reports thoroughly  -       Overnight EEG with video  Result Date: 02/08/2022 Lora Havens, MD     02/09/2022  8:49 AM Patient Name: Tammie Martin MRN: [id number] Epilepsy Attending: Lora Havens Referring Physician/Provider: Jacky Kindle, MD Duration: 02/07/2022 1734 to 02/08/2022 1734 Patient history:  71 year old female with a PMH of afib on  eliquis, TAVR, HTN, HLD presenting with seizure activity after a TAVR and subsequent stroke while she was off of her eliquis.  EEG to evaluate for seizure. Level of alertness: Awake, asleep AEDs during EEG study: LEV Technical aspects: This EEG study was done with scalp electrodes positioned according to the 10-20 International system of electrode placement. Electrical activity was acquired at a sampling rate of '500Hz'$  and reviewed with a high frequency filter of '70Hz'$  and a low frequency filter of '1Hz'$ . EEG data were recorded continuously and digitally stored. Description: The posterior dominant rhythm consists of 8 Hz activity of moderate voltage (25-35 uV) seen predominantly in posterior head regions, symmetric and reactive to eye opening and eye closing. Sleep was characterized by vertex waves, sleep spindles (12 to 14 Hz), maximal frontocentral region. EEG showed intermittent generalized and lateralized left hemisphere 3 to 6 Hz theta-delta slowing. Frequent spikes were noted in left posterior quadrant. Hyperventilation and photic stimulation were not performed.   One seizure without clinical sign was noted on a 02/08/2022 at 1104.  EEG showed spikes in left posterior quadrant admixed with 5 to 6 Hz theta slowing which gradually evolved into 2 to 3 Hz delta slowing and involves all of left hemisphere.  Seizure lasted for 2 minutes 20 seconds ABNORMALITY - Seizure without clinical sign, left posterior quadrant - Spike, left posterior quadrant - Intermittent slow, generalized and lateralized left hemisphere IMPRESSION: This study showed one seizure without clinical sign on 02/08/2022 at 1104 arising from left posterior quadrant, lasting about 2 minutes 20 seconds.  Additionally there is evidence of epileptogenicity arising from  left posterior quadrant.  There is also cortical dysfunction in left hemisphere which could be secondary to underlying stroke.  Additionally there is  mild diffuse encephalopathy, nonspecific etiology. Lora Havens   MR BRAIN WO CONTRAST  Result Date: 02/07/2022 CLINICAL DATA:  Neuro deficit, acute, stroke suspected EXAM: MRI HEAD WITHOUT CONTRAST TECHNIQUE: Multiplanar, multiecho pulse sequences of the brain and surrounding structures were obtained without intravenous contrast. COMPARISON:  CT head February 07, 2022. FINDINGS: Significantly motion limited study. Brain: Punctate foci of apparent restricted diffusion in the left frontal (series 3, image 30) and right occipital (series 3, image 23) lobes and right cerebellum (series 3, image 15). No evidence of acute hemorrhage, mass lesion, midline shift, hydrocephalus, or extra-axial fluid collection. Patchy T2/FLAIR hyperintensities in white matter, nonspecific but compatible with moderate chronic microvascular ischemic disease. Vascular: Major arterial flow voids are maintained the skull base. Skull and upper cervical spine: Normal marrow signal. Sinuses/Orbits: Clear sinuses.  No acute orbital findings. Other: No mastoid effusions. IMPRESSION: 1. Significantly motion limited study. Punctate foci of apparent restricted diffusion in the left fronta and right occipital lobes and right cerebellum could represent punctate acute infarcts or artifact. 2. Moderate chronic microvascular ischemic disease. Electronically Signed   By: Margaretha Sheffield M.D.   On: 02/07/2022 16:28   DG Chest Port 1 View  Result Date: 02/07/2022 CLINICAL DATA:  Seizure. EXAM: PORTABLE CHEST 1 VIEW COMPARISON:  Chest radiograph dated 02/06/2022. FINDINGS: No focal consolidation, pleural effusion, pneumothorax. Chronic interstitial coarsening. There is mild cardiomegaly. Aortic valve repair and calcification of the mitral annulus. Osteopenia with degenerative changes of the spine. No acute osseous pathology. IMPRESSION: No acute cardiopulmonary process. Electronically Signed   By: Anner Crete M.D.   On: 02/07/2022 03:00   CT  ANGIO HEAD NECK W WO CM W PERF (CODE STROKE)  Result Date: 02/07/2022 CLINICAL DATA:  Seizure EXAM: CT  ANGIOGRAPHY HEAD AND NECK CT PERFUSION BRAIN TECHNIQUE: Multidetector CT imaging of the head and neck was performed using the standard protocol during bolus administration of intravenous contrast. Multiplanar CT image reconstructions and MIPs were obtained to evaluate the vascular anatomy. Carotid stenosis measurements (when applicable) are obtained utilizing NASCET criteria, using the distal internal carotid diameter as the denominator. Multiphase CT imaging of the brain was performed following IV bolus contrast injection. Subsequent parametric perfusion maps were calculated using RAPID software. RADIATION DOSE REDUCTION: This exam was performed according to the departmental dose-optimization program which includes automated exposure control, adjustment of the mA and/or kV according to patient size and/or use of iterative reconstruction technique. CONTRAST:  151m OMNIPAQUE IOHEXOL 350 MG/ML SOLN COMPARISON:  None Available. FINDINGS: CTA NECK FINDINGS SKELETON: There is no bony spinal canal stenosis. No lytic or blastic lesion. OTHER NECK: Normal pharynx, larynx and major salivary glands. No cervical lymphadenopathy. Unremarkable thyroid gland. UPPER CHEST: Biapical emphysema AORTIC ARCH: There is no calcific atherosclerosis of the aortic arch. There is no aneurysm, dissection or hemodynamically significant stenosis of the visualized portion of the aorta. Conventional 3 vessel aortic branching pattern. The visualized proximal subclavian arteries are widely patent. RIGHT CAROTID SYSTEM: Normal without aneurysm, dissection or stenosis. LEFT CAROTID SYSTEM: Normal without aneurysm, dissection or stenosis. VERTEBRAL ARTERIES: Left dominant configuration. Both origins are clearly patent. There is no dissection, occlusion or flow-limiting stenosis to the skull base (V1-V3 segments). CTA HEAD FINDINGS POSTERIOR  CIRCULATION: --Vertebral arteries: Normal V4 segments. --Inferior cerebellar arteries: Normal. --Basilar artery: Normal. --Superior cerebellar arteries: Normal. --Posterior cerebral arteries (PCA): Normal. ANTERIOR CIRCULATION: --Intracranial internal carotid arteries: Normal. --Anterior cerebral arteries (ACA): Normal. Both A1 segments are present. Patent anterior communicating artery (a-comm). --Middle cerebral arteries (MCA): Normal. VENOUS SINUSES: As permitted by contrast timing, patent. ANATOMIC VARIANTS: None Review of the MIP images confirms the above findings. CT Brain Perfusion Findings: ASPECTS: 10 CBF (<30%) Volume: 04mPerfusion (Tmax>6.0s) volume: 50m34mismatch Volume: 50mL79mfarction Location:None IMPRESSION: 1. No emergent large vessel occlusion or high-grade stenosis of the intracranial arteries. 2. Normal CT perfusion. 3. Emphysema (ICD10-J43.9). Electronically Signed   By: KeviUlyses Jarred.   On: 02/07/2022 01:39   CT HEAD CODE STROKE WO CONTRAST`  Result Date: 02/07/2022 CLINICAL DATA:  Code stroke.  Seizure EXAM: CT HEAD WITHOUT CONTRAST TECHNIQUE: Contiguous axial images were obtained from the base of the skull through the vertex without intravenous contrast. RADIATION DOSE REDUCTION: This exam was performed according to the departmental dose-optimization program which includes automated exposure control, adjustment of the mA and/or kV according to patient size and/or use of iterative reconstruction technique. COMPARISON:  None Available. FINDINGS: Brain: There is no mass, hemorrhage or extra-axial collection. The size and configuration of the ventricles and extra-axial CSF spaces are normal. There is hypoattenuation of the periventricular white matter, most commonly indicating chronic ischemic microangiopathy. Vascular: No abnormal hyperdensity of the major intracranial arteries or dural venous sinuses. No intracranial atherosclerosis. Skull: The visualized skull base, calvarium and  extracranial soft tissues are normal. Sinuses/Orbits: No fluid levels or advanced mucosal thickening of the visualized paranasal sinuses. No mastoid or middle ear effusion. The orbits are normal. ASPECTS ([facility] Early CT Score) - Ganglionic level infarction (caudate, lentiform nuclei, internal capsule, insula, M1-M3 cortex): 7 - Supraganglionic infarction (M4-M6 cortex): 3 Total score (0-10 with 10 being normal): 10 IMPRESSION: 1. No acute intracranial abnormality. 2. ASPECTS is 10. These results were called by telephone at the time of interpretation on  02/07/2022 at 1:25 am to provider [facility] , who verbally acknowledged these results. Electronically Signed   By: Ulyses Jarred M.D.   On: 02/07/2022 01:25   DG Chest Port 1 View  Result Date: 02/06/2022 CLINICAL DATA:  Shortness of breath. EXAM: PORTABLE CHEST 1 VIEW COMPARISON:  01/28/2022 FINDINGS: The lungs are clear without focal pneumonia, edema, pneumothorax or pleural effusion. Interstitial markings are diffusely coarsened with chronic features. The cardio pericardial silhouette is enlarged. Telemetry leads overlie the chest. IMPRESSION: Cardiomegaly with chronic interstitial coarsening. Electronically Signed   By: Misty Stanley M.D.   On: 02/06/2022 09:48   EEG adult  Result Date: 02/05/2022 Derek Jack, MD     02/05/2022  3:49 PM Routine EEG Report Tammie Martin is a 71 y.o. female with a history of encephalopathy who is undergoing an EEG to evaluate for seizures. Report: This EEG was acquired with electrodes placed according to the International 10-20 electrode system (including Fp1, Fp2, F3, F4, C3, C4, P3, P4, O1, O2, T3, T4, T5, T6, A1, A2, Fz, Cz, Pz). The following electrodes were missing or displaced: none. The occipital dominant rhythm was primarily approximately 7 Hz with brief periods of normal alpha activity at 8.5 Hz. This activity is reactive to stimulation. Drowsiness was manifested by background  fragmentation; deeper stages of sleep were identified by K complexes and sleep spindles. There was no focal slowing. There were no interictal epileptiform discharges. There were no electrographic seizures identified. Photic stimulation and hyperventilation were not performed. Impression and clinical correlation: This EEG was obtained while awake and asleep and is abnormal due to mild diffuse slowing indicative of global cerebral dysfunction. Epileptiform abnormalities were not seen during this recording. Su Monks, MD [facility] [phone] If 7pm- 7am, please page neurology on call as listed in [place].   MR BRAIN WO CONTRAST  Result Date: 02/04/2022 CLINICAL DATA:  Neuro deficit, acute, stroke suspected; VISION DEFICIT EXAM: MRI HEAD WITHOUT CONTRAST TECHNIQUE: Multiplanar, multiecho pulse sequences of the brain and surrounding structures were obtained without intravenous contrast. COMPARISON:  None Available. FINDINGS: Brain: Few small scattered foci of diffusion hyperintensity including left frontal cortex, left posterior temporal cortex, and bilateral cerebellum. No evidence of intracranial hemorrhage. Ventricles and sulci are within normal limits in size and configuration. Patchy and confluent areas of T2 hyperintensity in the supratentorial and pontine white matter are nonspecific but may reflect mild to moderate chronic microvascular ischemic changes. Vascular: Major vessel flow voids at the skull base are preserved. Skull and upper cervical spine: Normal marrow signal is preserved. Sinuses/Orbits: Paranasal sinuses are aerated. Orbits are unremarkable. Other: Sella is unremarkable.  Mastoid air cells are clear. IMPRESSION: Few punctate acute to subacute infarcts involving different vascular territories. No hemorrhage. Chronic microvascular ischemic changes. Electronically Signed   By: Macy Mis M.D.   On: 02/04/2022 16:21   CT ANGIO HEAD NECK W WO CM  Result Date:  02/04/2022 CLINICAL DATA:  Neuro deficit, acute, stroke suspected EXAM: CT ANGIOGRAPHY HEAD AND NECK TECHNIQUE: Multidetector CT imaging of the head and neck was performed using the standard protocol during bolus administration of intravenous contrast. Multiplanar CT image reconstructions and MIPs were obtained to evaluate the vascular anatomy. Carotid stenosis measurements (when applicable) are obtained utilizing NASCET criteria, using the distal internal carotid diameter as the denominator. RADIATION DOSE REDUCTION: This exam was performed according to the departmental dose-optimization program which includes automated exposure control, adjustment of the mA and/or kV according to patient size and/or use  of iterative reconstruction technique. CONTRAST:  146m OMNIPAQUE IOHEXOL 350 MG/ML SOLN COMPARISON:  None Available. FINDINGS: CT HEAD FINDINGS Brain: No evidence of acute large vascular territory infarction, hemorrhage, hydrocephalus, extra-axial collection or mass lesion/mass effect. Patchy white matter hypodensities, nonspecific but compatible with chronic microvascular ischemic disease. Vascular: Detailed below. Skull: No acute fracture. Sinuses: Visualized sinuses are clear. Orbits: No acute finding. Review of the MIP images confirms the above findings CTA NECK FINDINGS Aortic arch: Aortic atherosclerosis. Great vessel origins are patent with mild-to-moderate atherosclerotic narrowing. Apparent linear filling defect within the brachiocephalic artery is favored artifactual given artifact this region. Right carotid system: Atherosclerosis at the carotid bifurcation without greater than 50% stenosis. Left carotid system: Atherosclerosis involving the common carotid artery and at the carotid bifurcation without greater than 50% stenosis. Vertebral arteries: Moderate to severe left vertebral artery origin stenosis. Otherwise, vertebral arteries are patent without significant stenosis. Skeleton: Severe multilevel  degenerative disc disease. Multilevel facet uncovertebral hypertrophy with varying degrees of neural foraminal stenosis. Other neck: No acute findings. Upper chest: Emphysema. Review of the MIP images confirms the above findings CTA HEAD FINDINGS Anterior circulation: Bilateral intracranial ICAs, MCAs, and ACAs are patent without proximal hemodynamically significant stenosis Posterior circulation: Bilateral intradural vertebral arteries, basilar artery and bilateral posterior cerebral arteries are patent without proximal hemodynamically significant stenosis. No aneurysm identified. Venous sinuses: As permitted by contrast timing, patent. Review of the MIP images confirms the above findings IMPRESSION: 1. No emergent large vessel occlusion. 2. 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 3. Bilateral carotid bifurcation atherosclerosis without greater than 50% stenosis. 4. Aortic Atherosclerosis (ICD10-I70.0) and Emphysema (ICD10-J43.9). Electronically Signed   By: FMargaretha SheffieldM.D.   On: 02/04/2022 14:31   ECHOCARDIOGRAM COMPLETE  Result Date: 02/02/2022    ECHOCARDIOGRAM REPORT   Patient Name:   Tammie CACHODate of Exam: 02/02/2022 Medical Rec #:  [id number]        Height:       62.5 in Accession #:    [id number]       Weight:       125.7 lb Date of Birth:  604-26-1952        BSA:          1.578 m Patient Age:    89years          BP:           137/87 mmHg Patient Gender: F                 HR:           95 bpm. 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 COPD, Aortic Valve Disease, Arrythmias:Atrial                 Fibrillation; Risk Factors:Hypertension, Dyslipidemia and                 Current Smoker. Thyroid disease.                 Aortic Valve: 23 mm Sapien prosthetic, stented (TAVR)  valve is                 present in the  aortic position. Procedure Date: 01/01/2022.  Sonographer:    Darlina Sicilian RDCS Referring Phys: [id number] KATHRYN R THOMPSON IMPRESSIONS  1. 23 mm S3. Mild paravalvular leak. Vmax 1.6 m/s, MG 5.0 mmHG, EOA 2.0 cm2, DI 0.55. Prosthesis within normal limits. The aortic valve has been repaired/replaced. Aortic valve regurgitation is mild. There is a 23 mm Sapien prosthetic (TAVR) valve present in the aortic position. Procedure Date: 01/01/2022.  2. Left ventricular ejection fraction, by estimation, is 60 to 65%. The left ventricle has normal function. The left ventricle has no regional wall motion abnormalities. Left ventricular diastolic function could not be evaluated.  3. Right ventricular systolic function is normal. The right ventricular size is mildly enlarged. There is severely elevated pulmonary artery systolic pressure.  4. Left atrial size was severely dilated.  5. Right atrial size was severely dilated.  6. The mitral valve is degenerative. Trivial mitral valve regurgitation. Mild mitral stenosis. The mean mitral valve gradient is 4.0 mmHg with average heart rate of 80 bpm. Moderate to severe mitral annular calcification.  7. The inferior vena cava is dilated in size with <50% respiratory variability, suggesting right atrial pressure of 15 mmHg. FINDINGS  Left Ventricle: Left ventricular ejection fraction, by estimation, is 60 to 65%. The left ventricle has normal function. The left ventricle has no regional wall motion abnormalities. The left ventricular internal cavity size was normal in size. There is  no left ventricular hypertrophy. Left ventricular diastolic function could not be evaluated due to atrial fibrillation. Left ventricular diastolic function could not be evaluated. Right Ventricle: The right ventricular size is mildly enlarged. No increase in right ventricular wall thickness. Right ventricular systolic function is normal. There is severely elevated  pulmonary artery systolic pressure. The tricuspid regurgitant velocity is 3.62 m/s, and with an assumed right atrial pressure of 15 mmHg, the estimated right ventricular systolic pressure is 35.0 mmHg. Left Atrium: Left atrial size was severely dilated. Right Atrium: Right atrial size was severely dilated. Pericardium: There is no evidence of pericardial effusion. Mitral Valve: The mitral valve is degenerative in appearance. Moderate to severe mitral annular calcification. Trivial mitral valve regurgitation. Mild mitral valve stenosis. MV peak gradient, 7.7 mmHg. The mean mitral valve gradient is 4.0 mmHg with average heart rate of 80 bpm. Tricuspid Valve: The tricuspid valve is grossly normal. Tricuspid valve regurgitation is mild. Aortic Valve: 23 mm S3. Mild paravalvular leak. Vmax 1.6 m/s, MG 5.0 mmHG, EOA 2.0 cm2, DI 0.55. Prosthesis within normal limits. The aortic valve has been repaired/replaced. Aortic valve regurgitation is mild. Aortic valve mean gradient measures 5.0 mmHg. Aortic valve peak gradient measures 10.1 mmHg. Aortic valve area, by VTI measures 2.00 cm. There is a 23 mm Sapien prosthetic, stented (TAVR) valve present in the aortic position. Procedure Date: 01/01/2022. Pulmonic Valve: The pulmonic valve was grossly normal. Pulmonic valve regurgitation is trivial. No evidence of pulmonic stenosis. Aorta: The aortic root and ascending aorta are structurally normal, with no evidence of dilitation. Venous: The inferior vena cava is dilated in size with less than 50% respiratory variability, suggesting right atrial pressure of 15 mmHg. IAS/Shunts: The atrial septum is grossly normal.  LEFT VENTRICLE PLAX 2D LVOT diam:     2.15 cm   Diastology LV SV:         61        LV e' medial:    3.94 cm/s LV SV Index:   39        LV E/e' medial:  32.0  LVOT Area:     3.63 cm  LV e' lateral:   11.20 cm/s                          LV E/e' lateral: 11.3  RIGHT VENTRICLE RV S prime:     9.38 cm/s TAPSE (M-mode): 2.0  cm LEFT ATRIUM              Index        RIGHT ATRIUM           Index LA Vol (A2C):   129.0 ml 81.73 ml/m  RA Area:     31.20 cm LA Vol (A4C):   128.0 ml 81.09 ml/m  RA Volume:   100.00 ml 63.35 ml/m LA Biplane Vol: 130.0 ml 82.36 ml/m  AORTIC VALVE AV Area (Vmax):    1.84 cm AV Area (Vmean):   1.93 cm AV Area (VTI):     2.00 cm AV Vmax:           159.00 cm/s AV Vmean:          106.500 cm/s AV VTI:            0.306 m AV Peak Grad:      10.1 mmHg AV Mean Grad:      5.0 mmHg LVOT Vmax:         80.40 cm/s LVOT Vmean:        56.650 cm/s LVOT VTI:          0.168 m LVOT/AV VTI ratio: 0.55  AORTA Ao Asc diam: 3.15 cm MITRAL VALVE                TRICUSPID VALVE MV Area (PHT): 2.90 cm     TR Peak grad:   52.4 mmHg MV Peak grad:  7.7 mmHg     TR Vmax:        362.00 cm/s MV Mean grad:  4.0 mmHg MV Vmax:       1.39 m/s     SHUNTS MV Vmean:      89.0 cm/s    Systemic VTI:  0.17 m MV Decel Time: 262 msec     Systemic Diam: 2.15 cm MV E velocity: 126.00 cm/s Eleonore Chiquito MD Electronically signed by Eleonore Chiquito MD Signature Date/Time: 02/02/2022/11:47:25 AM    Final    ECHO TEE  Result Date: 02/01/2022    TRANSESOPHOGEAL ECHO REPORT   Patient Name:   Tammie Martin Date of Exam: 02/01/2022 Medical Rec #:  [id number]         Height:       62.5 in Accession #:    [id number]        Weight:       120.0 lb Date of Birth:  [birth year]         BSA:          1.548 m Patient Age:    11 years          BP:           110/97 mmHg Patient Gender: F                 HR:           91 bpm. Exam Location:  Inpatient Procedure: Transesophageal Echo, Cardiac Doppler and Color Doppler Indications:     Aortic stenosis, severe  History:         Patient has prior history of Echocardiogram  examinations. COPD,                  Aortic Valve Disease, Arrythmias:Atrial Fibrillation; Risk                  Factors:Hypertension, Dyslipidemia and Current Smoker. Thyroid                  disease.                  Aortic Valve: 23 mm Sapien prosthetic,  stented (TAVR) valve is                  present in the aortic position. Procedure Date: 02/01/2022.  Sonographer:     Darlina Sicilian RDCS Referring Phys:  [place] Diagnosing Phys: Eleonore Chiquito MD PROCEDURE: After discussion of the risks and benefits of a TEE, an informed consent was obtained from the patient. The patient was intubated. The transesophogeal probe was passed without difficulty through the esophogus of the patient. Imaged were obtained with the patient in a supine position. Sedation performed by different physician. The patient was monitored while under deep sedation. Anesthestetic sedation was provided intravenously by Anesthesiology: '90mg'$  of Propofol, '60mg'$  of Lidocaine. Image quality was excellent. The patient's vital signs; including heart rate, blood pressure, and oxygen saturation; remained stable throughout the procedure. The patient developed no complications during the procedure. IMPRESSIONS  1. TEE guided TAVR. 23 mm S3 deployed. Vmax 1.2 m/s, MG 2.0 mmHG, EOA 2.77 cm2, DI 0.80. Trivial PVL in the 12-1 o'clock position. Normal prosthesis. The aortic valve has been repaired/replaced. Aortic valve regurgitation is trivial. There is a 23 mm Sapien prosthetic (TAVR) valve present in the aortic position. Procedure Date: 02/01/2022.  2. LAA sludge present. Low LAA velocity noted ~22 cm/s. Left atrial size was severely dilated. A left atrial/left atrial appendage thrombus was detected.  3. Left ventricular ejection fraction, by estimation, is 60 to 65%. The left ventricle has normal function.  4. Right ventricular systolic function is normal. The right ventricular size is mildly enlarged.  5. Right atrial size was severely dilated.  6. The mitral valve is degenerative. Mild mitral valve regurgitation. No evidence of mitral stenosis. Moderate to severe mitral annular calcification.  7. Tricuspid valve regurgitation is mild to moderate. FINDINGS  Left Ventricle: Left ventricular ejection  fraction, by estimation, is 60 to 65%. The left ventricle has normal function. The left ventricular internal cavity size was normal in size. Right Ventricle: The right ventricular size is mildly enlarged. No increase in right ventricular wall thickness. Right ventricular systolic function is normal. Left Atrium: LAA sludge present. Low LAA velocity noted ~22 cm/s. Left atrial size was severely dilated. A left atrial/left atrial appendage thrombus was detected. Right Atrium: Right atrial size was severely dilated. Pericardium: Trivial pericardial effusion is present. Mitral Valve: The mitral valve is degenerative in appearance. Moderate to severe mitral annular calcification. Mild mitral valve regurgitation. No evidence of mitral valve stenosis. Tricuspid Valve: The tricuspid valve is grossly normal. Tricuspid valve regurgitation is mild to moderate. No evidence of tricuspid stenosis. 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 Vmax 1.2 m/s, MG 2.0 mmHG, EOA 2.77 cm2, DI 0.80. Trivial PVL in the 12-1 o'clock position. Normal prosthesis. The aortic valve has been repaired/replaced. Aortic valve regurgitation is trivial. Aortic regurgitation PHT measures 693 msec. Aortic valve mean gradient measures 2.0 mmHg. Aortic valve peak gradient measures 5.7 mmHg. Aortic valve area, by VTI measures 2.77 cm.  There is a 23 mm Sapien prosthetic, stented (TAVR) valve present in the aortic position. Procedure Date: 02/01/2022. Pulmonic Valve: The pulmonic valve was grossly normal. Pulmonic valve regurgitation is not visualized. No evidence of pulmonic stenosis. Aorta: The aortic root and ascending aorta are structurally normal, with no evidence of dilitation. There is minimal (Grade I) layered plaque involving the descending aorta. IAS/Shunts: The atrial septum is grossly normal.  LEFT VENTRICLE PLAX 2D LVOT diam:     2.10 cm LV SV:         59 LV SV Index:   38 LVOT Area:     3.46 cm  AORTIC VALVE AV Area (Vmax):    2.47 cm  AV Area (Vmean):   1.22 cm AV Area (VTI):     2.77 cm AV Vmax:           119.00 cm/s AV Vmean:          158.100 cm/s AV VTI:            0.211 m AV Peak Grad:      5.7 mmHg AV Mean Grad:      2.0 mmHg LVOT Vmax:         84.70 cm/s LVOT Vmean:        55.900 cm/s LVOT VTI:          0.169 m LVOT/AV VTI ratio: 0.80 AI PHT:            693 msec  AORTA Ao Root diam: 3.14 cm Ao Asc diam:  2.90 cm TRICUSPID VALVE TR Peak grad:   27.5 mmHg TR Vmax:        262.00 cm/s  SHUNTS Systemic VTI:  0.17 m Systemic Diam: 2.10 cm Eleonore Chiquito MD Electronically signed by Eleonore Chiquito MD Signature Date/Time: 02/01/2022/2:26:22 PM    Final    Structural Heart Procedure  Result Date: 02/01/2022 See surgical note for result.  DG Chest 2 View  Result Date: 01/30/2022 CLINICAL DATA:  Preop for aortic valve replacement. EXAM: CHEST - 2 VIEW COMPARISON:  Two-view chest x-ray a E 09/10/2021 FINDINGS: The heart is enlarged. No edema or effusion is present. Mitral annular calcifications are present. Changes of COPD noted. No edema or effusion is present. No focal airspace disease is present. Bilateral breast implants noted. IMPRESSION: Cardiomegaly without failure. Electronically Signed   By: San Morelle M.D.   On: 01/30/2022 07:24     Today   Subjective    Tammie Martin today has no headache,no chest abdominal pain,no new weakness tingling or numbness, feels much better wants to go home today.     Objective   Blood pressure 133/89, pulse 81, temperature 98 F (36.7 C), temperature source Oral, resp. rate 19, height '5\' 7"'$  (1.702 m), weight 53.2 kg, SpO2 92 %.  No intake or output data in the 24 hours ending 02/11/22 1049  Exam  Awake Alert, No new F.N deficits,    [place].AT,PERRAL Supple Neck,   Symmetrical Chest wall movement, Good air movement bilaterally, CTAB RRR,No Gallops,   +ve B.Sounds, Abd Soft, Non tender,  No Cyanosis, Clubbing or edema    Data Review   Recent Labs  Lab 02/04/22 1250  02/05/22 0049 02/07/22 0049 02/08/22 0201 02/10/22 0104  WBC 11.6* 12.9* 12.5* 12.1* 11.2*  HGB 13.5 13.4 15.2* 13.4 13.4  HCT 39.5 39.6 44.5 39.8 39.6  PLT 152 154 186 142* 175  MCV 96.6 96.1 94.3 95.4 94.5  MCH 33.0 32.5 32.2 32.1 32.0  MCHC 34.2 33.8 34.2 33.7 33.8  RDW 13.2 13.1 13.1 13.1 12.8  LYMPHSABS  --   --  2.9  --   --   MONOABS  --   --  1.0  --   --   EOSABS  --   --  0.5  --   --   BASOSABS  --   --  0.1  --   --     Recent Labs  Lab 02/04/22 1250 02/04/22 1302 02/05/22 0049 02/05/22 0953 02/06/22 0319 02/07/22 0049 02/08/22 0201 02/10/22 0104 02/11/22 0108  NA 133*  --  134* 136 137 134* 138 135 138  K 3.3*  --  3.7 4.0 3.8 3.5 3.3* 3.4* 4.2  CL 97*  --  98 99 102 101 104 99 99  CO2 31  --  '25 25 26 25 23 28 29  '$ GLUCOSE 109*  --  119* 112* 107* 151* 81 114* 113*  BUN 11  --  6* 8 13 24* '12 19 14  '$ CREATININE 0.51  --  0.48 0.57 0.58 0.65 0.57 0.62 0.67  CALCIUM 8.5*  --  8.9 9.2 9.1 8.9 8.5* 8.4* 9.1  AST 49*  --   --  33 34 48* 28 29  --   ALT 74*  --   --  56* 51* 61* 38 34  --   ALKPHOS 145*  --   --  142* 130* 138* 106 116  --   BILITOT 1.1  --   --  1.2 1.2 1.2 0.9 0.7  --   ALBUMIN 3.3*  --   --  3.3* 3.0* 3.5 2.8* 2.9*  --   MG  --   --   --   --   --   --  1.8 2.0  --   PHOS  --   --   --   --   --   --  3.4  --   --   PROCALCITON  --   --   --   --   --  <0.10  --   --   --   INR  --  1.1  --   --   --  1.4*  --   --   --   TSH  --   --  0.399  --   --   --   --   --   --   HGBA1C 5.6  --   --   --   --   --   --   --   --   AMMONIA  --   --  23  --   --   --   --   --   --     Total Time in preparing paper work, data evaluation and todays exam - 8 minutes  Lala Lund M.D on 02/11/2022 at 10:49 AM  [facility]

## 2022-02-14 ENCOUNTER — Telehealth: Payer: Self-pay | Admitting: Emergency Medicine

## 2022-02-14 ENCOUNTER — Telehealth: Payer: Self-pay

## 2022-02-14 NOTE — Telephone Encounter (Signed)
Tammie Martin, please be so kind as to check our schedule to see if we might accommodate a sooner appt for this patient.

## 2022-02-14 NOTE — Telephone Encounter (Signed)
Pt aware we cannot get in sooner

## 2022-02-14 NOTE — Telephone Encounter (Signed)
Transition Care Management Unsuccessful Follow-up Telephone Call  Date of discharge and from where:  02/11/22 - [place]   Attempts:  1st Attempt  Reason for unsuccessful TCM follow-up call:  Left voice message

## 2022-02-14 NOTE — Telephone Encounter (Signed)
Sister Raizy Auzenne calling to set up hospital Followup- She wants to see Dr Lajuana Ripple, no Appts. Please advise.   Previous appt was scheduled for 06/30- unsure if we should wait or can we change that appt to hospital f/u

## 2022-02-15 NOTE — Progress Notes (Unsigned)
HEART AND [place]                                     Cardiology Office Note:    Date:  02/16/2022   ID:  Tammie Martin, DOB Tammie Martin, MRN [id number]  PCP:  Tammie Norlander, DO  [facility] Cardiologist:  Tammie Dolly, MD/ Dr. Burt Knack, MD & Dr. Cyndia Bent, MD (TAVR) [facility] Electrophysiologist:  None   Referring MD: Tammie Norlander, DO   Chief Complaint  Patient presents with   Follow-up    TOC s/p TAVR   History of Present Illness:    Tammie Martin is a 71 y.o. female with a hx of persistent atrial fibrillation, aortic stenosis s/p recent TAVR, COPD, history of mild to moderate coronary artery disease, hypertension, and hyperlipidemia who underwent TAVR 02/01/22 and was unfortunately re-admitted two days after discharge with CVA and AF with RVR.   Tammie Martin is followed by Tammie Martin. She was first diagnosed with a heart murmur in 2017 and was found to have mild AS at that time. Patient was diagnosed with atrial fibrillation in 09/2021, and underwent echocardiogram on 10/20/21 that showed EF 60-65%, mild LVH, low normal RV systolic function, severely elevated pulmonary artery systolic pressure, and moderate to severe low flow low gradient aortic stenosis. Patient underwent elective cardioversion for her atrial fibrillation, remained in SR for about a week, and then had recurrence of afib. Her atrial fibrillation will be managed medically with a rate control/anticoagulation strategy.    She was referred to Dr. Burt Martin for evaluation of aortic stenosis on 11/30/21. At that appointment, she reported she was having progressive symptoms including limitation with exertional dyspnea, occasional chest burning. Due to these symptoms and her echocardiogram findings, patient was scheduled for TAVR. Underwent TAVR with S3UR 71m on 02/01/22. She tolerated the procedure well and was discharged 02/02/22.   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    She was once again admitted 02/07/22 with AMS and seizure activity. Neurology was consulted and followed with her during her hospitalization. She was started on Keppra.   Today she presents with her sister. She has been stable since discharge. She is having mostly cognitive difficulty with memory issues and word finding however this seems to be mild. She has been doing well physically and is able to ambulate without assistance although is rather weak. She has noticed improvement in her exertional SOB after TAVR. She denies chest pain, LE edema, orthopnea, dizziness, or syncope. She does report increased palpitations at times. Her HR today is 52bpm.   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 Burt Martin and Dr. Cyndia Martin   Severe aortic stenosis    Thyroid disease    Tobacco abuse     Past Surgical History:  Procedure Laterality Date   AORTIC VALVE REPLACEMENT     APPENDECTOMY     pt recently had CT that said Appendix was there so she is unsure if this was actually removed during hysterectomy as she was previously told   BREAST ENHANCEMENT SURGERY     CARDIOVERSION N/A 10/27/2021   Procedure: CARDIOVERSION;  Surgeon: Arnoldo Lenis, MD;  Location: [facility];  Service: Endoscopy;  Laterality: N/A;    INTRAOPERATIVE TRANSESOPHAGEAL ECHOCARDIOGRAM N/A 02/01/2022   Procedure: INTRAOPERATIVE TRANSESOPHAGEAL ECHOCARDIOGRAM;  Surgeon: Sherren Mocha, MD;  Location: [place];  Service: Open Heart Surgery;  Laterality: N/A;   RIGHT/LEFT HEART CATH AND CORONARY ANGIOGRAPHY N/A 11/22/2021   Procedure: RIGHT/LEFT HEART CATH AND CORONARY ANGIOGRAPHY;  Surgeon: Sherren Mocha, MD;  Location: [place] [facility];  Service: Cardiovascular;  Laterality: N/A;   TONSILLECTOMY     VAGINAL HYSTERECTOMY      Current Medications: Current Meds  Medication Sig   acetaminophen (TYLENOL) 500 MG tablet Take 1,000 mg by mouth every 6 (six) hours as needed for moderate pain.   albuterol (VENTOLIN HFA) 108 (90 Base) MCG/ACT inhaler Inhale 2 puffs into the lungs every 6 (six) hours as needed for wheezing or shortness of breath.   atorvastatin (LIPITOR) 20 MG tablet Take 2 tablets (40 mg total) by mouth daily.   atorvastatin (LIPITOR) 40 MG tablet Take 40 mg by mouth daily.   azithromycin (ZITHROMAX) 500 MG tablet Take 1 tablet (500 mg total) by mouth as directed. TAKE 500 MG  1 HOUR PRIOR TO ANY DENTAL  CLEANINGS AND PROCEDURES.   bisoprolol (ZEBETA) 10 MG tablet Take 1.5 tablets (15 mg total) by mouth daily.   CALCIUM-MAGNESIUM PO Take 1 tablet by mouth daily.   cetirizine (ZYRTEC) 10 MG tablet Take 10 mg by mouth daily.   Cholecalciferol (VITAMIN D) 50 MCG (2000 UT) tablet Take 2,000 Units by mouth daily.   desvenlafaxine (PRISTIQ) 50 MG 24 hr tablet Take 1 tablet (50 mg total) by mouth daily.   ELIQUIS 5 MG TABS tablet TAKE ONE TABLET BY MOUTH TWICE DAILY   fluticasone (FLONASE) 50 MCG/ACT nasal spray Place 2 sprays into both nostrils daily as needed for allergies or rhinitis.   furosemide (LASIX) 20 MG tablet TAKE ONE TABLET BY MOUTH DAILY   hydrocortisone cream 1 % Apply 1 application. topically 2 (two) times daily as needed (rash).   levETIRAcetam (KEPPRA) 750 MG tablet Take 2 tablets (1,500 mg total) by mouth 2  (two) times daily.   levothyroxine (SYNTHROID) 88 MCG tablet Take 1 tablet by mouth daily.   Multiple Vitamins-Minerals (ALIVE WOMENS 50+ PO) Take 1 tablet by mouth daily.   Multiple Vitamins-Minerals (PRESERVISION AREDS 2 PO) Take 1 capsule by mouth in the morning and at bedtime.   Omega-3-6-9 CAPS Take 1 capsule by mouth 2 (two) times daily.   Polyethyl Glycol-Propyl Glycol (SYSTANE OP) Place 1 drop into both eyes daily as needed (dry eyes).   potassium chloride SA (KLOR-CON M) 10 MEQ tablet Take 1 tablet (10 mEq total) by mouth daily.   Probiotic Product (PROBIOTIC ADVANCED PO) Take 1 capsule by mouth daily.   traZODone (DESYREL) 50 MG tablet Take 0.5-1 tablets (25-50 mg total) by mouth at bedtime as needed for sleep. (Patient taking differently: Take 50 mg by mouth at  bedtime as needed for sleep.)   TURMERIC PO Take 1 capsule by mouth 2 (two) times daily.   umeclidinium-vilanterol (ANORO ELLIPTA) 62.5-25 MCG/ACT AEPB Inhale 1 puff into the lungs daily at 6 (six) AM.     Allergies:   Penicillins, Singulair [montelukast sodium], Codeine, Nicoderm [nicotine], and Other   Social History   Socioeconomic History   Marital status: Widowed    Spouse name: Not on file   Number of children: 2   Years of education: 14   Highest education level: Associate degree: academic program  Occupational History   Occupation: Retired    Comment: Equities trader  Tobacco Use   Smoking status: Former    Packs/day: 1.00    Years: 40.00    Total pack years: 40.00    Types: Cigarettes   Smokeless tobacco: Never  Vaping Use   Vaping Use: Never used  Substance and Sexual Activity   Alcohol use: Not Currently   Drug use: No   Sexual activity: Not Currently  Other Topics Concern   Not on file  Social History Narrative   Lives alone - one cat, one dog   Social Determinants of Health   Financial Resource Strain: Low Risk  (06/03/2021)   Overall Financial Resource Strain (CARDIA)    Difficulty of  Paying Living Expenses: Not hard at all  Food Insecurity: No Food Insecurity (06/03/2021)   Hunger Vital Sign    Worried About Running Out of Food in the Last Year: Never true    [place] in the Last Year: Never true  Transportation Needs: No Transportation Needs (06/03/2021)   PRAPARE - Hydrologist (Medical): No    Lack of Transportation (Non-Medical): No  Physical Activity: Inactive (06/03/2021)   Exercise Vital Sign    Days of Exercise per Week: 0 days    Minutes of Exercise per Session: 0 min  Stress: No Stress Concern Present (06/03/2021)   [place]    Feeling of Stress : Not at all  Social Connections: Moderately Isolated (06/03/2021)   Social Connection and Isolation Panel [NHANES]    Frequency of Communication with Friends and Family: More than three times a week    Frequency of Social Gatherings with Friends and Family: More than three times a week    Attends Religious Services: Never    Marine scientist or Organizations: Yes    Attends Music therapist: More than 4 times per year    Marital Status: Widowed     Family History: The patient's family history includes Alcohol abuse in her brother; Anxiety disorder in her daughter; Arthritis in her daughter; Cancer in her maternal aunt and mother; Cirrhosis in her brother; Depression in her brother, daughter, sister, and sister; Diabetes in her daughter and sister; Fibromyalgia in her daughter; Heart attack in her father; Kidney disease in her mother; Liver disease in her brother; Obesity in her sister and sister.  ROS:   Please see the history of present illness.    All other systems reviewed and are negative.  EKGs/Labs/Other Studies Reviewed:    The following studies were reviewed today:    TAVR OPERATIVE NOTE     Date of Procedure:                02/01/2022   Preoperative Diagnosis:      Severe Aortic  Stenosis    Postoperative Diagnosis:  Same    Procedure:        Transcatheter Aortic Valve Replacement - Percutaneous  Transfemoral Approach             Edwards Sapien 3 Ultra ResiliaTHV (size 23 mm, serial # [id number])              Co-Surgeons:                        Gaye Pollack, MD and Sherren Mocha, MD   Anesthesiologist:                  Hoy Morn, MD   Echocardiographer:              Marry Guan, MD   Pre-operative Echo Findings: Severe aortic stenosis Normal left ventricular systolic function   Post-operative Echo Findings: Trivial paravalvular leak Normal left ventricular systolic function   Echocardiogram 02/02/2022:   1. 23 mm S3. Mild paravalvular leak. Vmax 1.6 m/s, MG 5.0 mmHG, EOA 2.0  cm2, DI 0.55. Prosthesis within normal limits. The aortic valve has been  repaired/replaced. Aortic valve regurgitation is mild. There is a 23 mm  Sapien prosthetic (TAVR) valve  present in the aortic position. Procedure Date: 01/01/2022.   2. Left ventricular ejection fraction, by estimation, is 60 to 65%. The  left ventricle has normal function. The left ventricle has no regional  wall motion abnormalities. Left ventricular diastolic function could not  be evaluated.   3. Right ventricular systolic function is normal. The right ventricular  size is mildly enlarged. There is severely elevated pulmonary artery  systolic pressure.   4. Left atrial size was severely dilated.   5. Right atrial size was severely dilated.   6. The mitral valve is degenerative. Trivial mitral valve regurgitation.  Mild mitral stenosis. The mean mitral valve gradient is 4.0 mmHg with  average heart rate of 80 bpm. Moderate to severe mitral annular  calcification.   7. The inferior vena cava is dilated in size with <50% respiratory  variability, suggesting right atrial pressure of 15 mmHg.    EKG:  EKG is ordered today. EKG completed on hospital readmission which showed no evidence of AV block.     Recent Labs: 09/16/2021: BNP 338.5 02/05/2022: TSH 0.399 02/10/2022: ALT 34; Hemoglobin 13.4; Magnesium 2.0; Platelets 175 02/11/2022: BUN 14; Creatinine, Ser 0.67; Potassium 4.2; Sodium 138   Recent Lipid Panel    Component Value Date/Time   CHOL 133 02/05/2022 0941   CHOL 144 04/15/2021 1328   TRIG 89 02/05/2022 0941   HDL 36 (L) 02/05/2022 0941   HDL 41 04/15/2021 1328   CHOLHDL 3.7 02/05/2022 0941   VLDL 18 02/05/2022 0941   LDLCALC 79 02/05/2022 0941   LDLCALC 84 04/15/2021 1328   Physical Exam:    VS:  BP 110/60 (BP Location: Left Arm, Patient Position: Sitting, Cuff Size: Normal)   Pulse (!) 54   Ht '5\' 7"'$  (1.702 m)   Wt 111 lb (50.3 kg)   SpO2 95%   BMI 17.39 kg/m     Wt Readings from Last 3 Encounters:  02/16/22 111 lb (50.3 kg)  02/11/22 117 lb 4.6 oz (53.2 kg)  02/04/22 114 lb 13.8 oz (52.1 kg)    General: Well developed, well nourished, NAD Neck: Negative for carotid bruits. No JVD Lungs:Clear to ausculation bilaterally. No wheezes, rales, or rhonchi. Breathing is unlabored. Cardiovascular: Irregularly irregular. No murmurs Abdomen: Soft, non-tender, non-distended with normoactive  bowel sounds. No hepatomegaly, No rebound/guarding. No obvious abdominal masses. MSK: Strength and tone appear normal for age. 5/5 in all extremities Extremities: No edema. No clubbing or cyanosis. DP/PT pulses 2+ bilaterally Neuro: Alert and oriented. No focal deficits. No facial asymmetry. MAE spontaneously. Psych: Responds to questions appropriately with normal affect.    ASSESSMENT/PLAN:    Severe AS: s/p successful TAVR with a 23 mm Edwards Sapien 3 UR via the TF  approach on 02/01/22. Post operative echo with normal prothesis, mean gradient at 15mHg and mild paravalvular leak. Patient doing well with improved symptoms. Continue Eliquis given persistent AF. Groin sites healing well with no issues. SBE discussed and RX'ed with Azithromycin given PCN allergy. Plan one month follow  up with echocardiogram.   Palpitations with known AF: C/o intermittent palpitations which I suspect is her AF. Currently on bisoprolol due to COPD and HR in the low 50's today with soft BP. Will obtain ZIO to assess AF burden.  Hx of acute CVA:  Felt to be secondary to left atrial appendage thrombus seen on intraoperative TEE on 6/6.  Suspicion that she may have developed this thrombus when she was off Eliquis pre-TAVR. Continue Eliquis. No new neuro changes. Struggling with memory and word finding. Has follow up with neurology early July. Asking about speech therapy.   Seizure disorder: In the setting of recent CVA. Seen by neurology and placed on Keppra with no recurrent episodes after discharge. Has follow up with neurology early July.     HTN: Soft BP today with no symptoms. Continue bisoprolol for now. May need to adjust regimen based on ZIO results.    COPD with ongoing tobacco use: Continue bronchodilators. Reports improvement in SOB post TAVR    Medication Adjustments/Labs and Tests Ordered: Current medicines are reviewed at length with the patient today.  Concerns regarding medicines are outlined above.  Orders Placed This Encounter  Procedures   LONG TERM MONITOR (3-14 DAYS)   Meds ordered this encounter  Medications   azithromycin (ZITHROMAX) 500 MG tablet    Sig: Take 1 tablet (500 mg total) by mouth as directed. TAKE 500 MG  1 HOUR PRIOR TO ANY DENTAL  CLEANINGS AND PROCEDURES.    Dispense:  4 tablet    Refill:  6    Patient Instructions  Medication Instructions:  Your physician has recommended you make the following change in your medication:  START AZITHROMYCIN 500 MG 1 HOUR PRIOR TO ANY DENTAL  CLEANINGS AND PROCEDURES. *If you need a refill on your cardiac medications before your next appointment, please call your pharmacy*  Lab Work: NONE If you have labs (blood work) drawn today and your tests are completely normal, you will receive your results only by: [place](if you have MyChart) OR A paper copy in the mail If you have any lab test that is abnormal or we need to change your treatment, we will call you to review the results.   Testing/Procedures: ZBryn [place] Long Term Monitor Instructions  Your physician has requested you wear a ZIO patch monitor for 14 days.  This is a single patch monitor. [employer] supplies one patch monitor per enrollment. Additional stickers are not available. Please do not apply patch if you will be having a Nuclear Stress Test,  Echocardiogram, Cardiac CT, MRI, or Chest Xray during the period you would be wearing the  monitor. The patch cannot be worn during these tests. You cannot remove and re-apply the  ZIO XT patch monitor.  Your ZIO patch monitor will be mailed 3 day USPS to your address on file. It may take 3-5 days  to receive your monitor after you have been enrolled.  Once you have received your monitor, please review the enclosed instructions. Your monitor  has already been registered assigning a specific monitor serial # to you.  Billing and Patient Assistance Program Information  We have supplied [employer] with any of your insurance information on file for billing purposes. [employer] offers a sliding scale Patient Assistance Program for patients that do not have  insurance, or whose insurance does not completely cover the cost of the ZIO monitor.  You must apply for the Patient Assistance Program to qualify for this discounted rate.  To apply, please call [employer] at [phone], select option 4, select option 2, ask to apply for  Patient Assistance Program. Theodore Demark will ask your household income, and how many people  are in your household. They will quote your out-of-pocket cost based on that information.  [employer] will also be able to set up a 82-month interest-free payment plan if needed.  Applying the monitor   Shave hair from upper left chest.  Hold abrader disc by orange tab. Rub abrader in 40  strokes over the upper left chest as  indicated in your monitor instructions.  Clean area with 4 enclosed alcohol pads. Let dry.  Apply patch as indicated in monitor instructions. Patch will be placed under collarbone on left  side of chest with arrow pointing upward.  Rub patch adhesive wings for 2 minutes. Remove white label marked "1". Remove the white  label marked "2". Rub patch adhesive wings for 2 additional minutes.  While looking in a mirror, press and release button in center of patch. A small green light will  flash 3-4 times. This will be your only indicator that the monitor has been turned on.  Do not shower for the first 24 hours. You may shower after the first 24 hours.  Press the button if you feel a symptom. You will hear a small click. Record Date, Time and  Symptom in the Patient Logbook.  When you are ready to remove the patch, follow instructions on the last 2 pages of Patient  Logbook. Stick patch monitor onto the last page of Patient Logbook.  Place Patient Logbook in the blue and white box. Use locking tab on box and tape box closed  securely. The blue and white box has prepaid postage on it. Please place it in the mailbox as  soon as possible. Your physician should have your test results approximately 7 days after the  monitor [facility]  Call IDoverat [phone] you have questions regarding  your ZIO XT patch monitor. Call them immediately if you see an orange light blinking on your  monitor.  If your monitor falls off in less than 4 days, contact our Monitor department at [phone]  If your monitor becomes loose or falls off after 4 days call [employer] at [phone]  suggestions on securing your monitor  Follow-Up: [facility] you and your health needs are our priority.  As part of our continuing mission to provide you with exceptional heart care, we have created designated Provider Care  Teams.  These Care Teams include your primary Cardiologist (physician) and Advanced Practice Providers (APPs -  Physician Assistants and Nurse Practitioners) who all work together to provide you with the care you need, when you need  it.  We recommend signing up for the patient portal called "MyChart".  Sign up information is provided on this After Visit Summary.  MyChart is used to connect with patients for Virtual Visits (Telemedicine).  Patients are able to view lab/test results, encounter notes, upcoming appointments, etc.  Non-urgent messages can be sent to your provider as well.   To learn more about what you can do with MyChart, go to [email or web address].    Your next appointment:   KEEP SCHEDULED FOLLOW-UP  Important Information About Sugar         Lyndel Safe, NP  02/16/2022 10:24 AM    Tammie Martin

## 2022-02-16 ENCOUNTER — Ambulatory Visit (INDEPENDENT_AMBULATORY_CARE_PROVIDER_SITE_OTHER): Payer: Medicare HMO

## 2022-02-16 ENCOUNTER — Ambulatory Visit: Payer: Medicare HMO | Admitting: Cardiology

## 2022-02-16 VITALS — BP 110/60 | HR 54 | Ht 67.0 in | Wt 111.0 lb

## 2022-02-16 DIAGNOSIS — I639 Cerebral infarction, unspecified: Secondary | ICD-10-CM

## 2022-02-16 DIAGNOSIS — J449 Chronic obstructive pulmonary disease, unspecified: Secondary | ICD-10-CM | POA: Diagnosis not present

## 2022-02-16 DIAGNOSIS — I4819 Other persistent atrial fibrillation: Secondary | ICD-10-CM

## 2022-02-16 DIAGNOSIS — I10 Essential (primary) hypertension: Secondary | ICD-10-CM | POA: Diagnosis not present

## 2022-02-16 DIAGNOSIS — I350 Nonrheumatic aortic (valve) stenosis: Secondary | ICD-10-CM | POA: Diagnosis not present

## 2022-02-16 DIAGNOSIS — Z952 Presence of prosthetic heart valve: Secondary | ICD-10-CM

## 2022-02-16 MED ORDER — AZITHROMYCIN 500 MG PO TABS
500.0000 mg | ORAL_TABLET | ORAL | 6 refills | Status: DC
Start: 2022-02-16 — End: 2022-12-14

## 2022-02-16 NOTE — Telephone Encounter (Signed)
Transition Care Management Unsuccessful Follow-up Telephone Call  Date of discharge and from where:  [facility]/11/22 [facility]  Attempts:  2nd Attempt  Reason for unsuccessful TCM follow-up call:  Left voice message

## 2022-02-16 NOTE — Patient Instructions (Addendum)
Medication Instructions:  Your physician has recommended you make the following change in your medication:  START AZITHROMYCIN 500 MG 1 HOUR PRIOR TO ANY DENTAL  CLEANINGS AND PROCEDURES. *If you need a refill on your cardiac medications before your next appointment, please call your pharmacy*  Lab Work: NONE If you have labs (blood work) drawn today and your tests are completely normal, you will receive your results only by: [place] (if you have MyChart) OR A paper copy in the mail If you have any lab test that is abnormal or we need to change your treatment, we will call you to review the results.   Testing/Procedures: [place]- Long Term Monitor Instructions  Your physician has requested you wear a ZIO patch monitor for 14 days.  This is a single patch monitor. [employer] supplies one patch monitor per enrollment. Additional stickers are not available. Please do not apply patch if you will be having a Nuclear Stress Test,  Echocardiogram, Cardiac CT, MRI, or Chest Xray during the period you would be wearing the  monitor. The patch cannot be worn during these tests. You cannot remove and re-apply the  ZIO XT patch monitor.  Your ZIO patch monitor will be mailed 3 day USPS to your address on file. It may take 3-5 days  to receive your monitor after you have been enrolled.  Once you have received your monitor, please review the enclosed instructions. Your monitor  has already been registered assigning a specific monitor serial # to you.  Billing and Patient Assistance Program Information  We have supplied [employer] with any of your insurance information on file for billing purposes. [employer] offers a sliding scale Patient Assistance Program for patients that do not have  insurance, or whose insurance does not completely cover the cost of the ZIO monitor.  You must apply for the Patient Assistance Program to qualify for this discounted rate.  To apply, please call [employer] at  [phone], select option 4, select option 2, ask to apply for  Patient Assistance Program. Theodore Demark will ask your household income, and how many people  are in your household. They will quote your out-of-pocket cost based on that information.  [employer] will also be able to set up a 29-month interest-free payment plan if needed.  Applying the monitor   Shave hair from upper left chest.  Hold abrader disc by orange tab. Rub abrader in 40 strokes over the upper left chest as  indicated in your monitor instructions.  Clean area with 4 enclosed alcohol pads. Let dry.  Apply patch as indicated in monitor instructions. Patch will be placed under collarbone on left  side of chest with arrow pointing upward.  Rub patch adhesive wings for 2 minutes. Remove white label marked "1". Remove the white  label marked "2". Rub patch adhesive wings for 2 additional minutes.  While looking in a mirror, press and release button in center of patch. A small green light will  flash 3-4 times. This will be your only indicator that the monitor has been turned on.  Do not shower for the first 24 hours. You may shower after the first 24 hours.  Press the button if you feel a symptom. You will hear a small click. Record Date, Time and  Symptom in the Patient Logbook.  When you are ready to remove the patch, follow instructions on the last 2 pages of Patient  Logbook. Stick patch monitor onto the last page of Patient Logbook.  Place Patient  Logbook in the blue and white box. Use locking tab on box and tape box closed  securely. The blue and white box has prepaid postage on it. Please place it in the mailbox as  soon as possible. Your physician should have your test results approximately 7 days after the  monitor has been mailed back to [facility].  Call [place] at [phone] if you have questions regarding  your ZIO XT patch monitor. Call them immediately if you see an orange light  blinking on your  monitor.  If your monitor falls off in less than 4 days, contact our Monitor department at [phone].  If your monitor becomes loose or falls off after 4 days call [employer] at [phone] for  suggestions on securing your monitor  Follow-Up: At [facility], you and your health needs are our priority.  As part of our continuing mission to provide you with exceptional heart care, we have created designated Provider Care Teams.  These Care Teams include your primary Cardiologist (physician) and Advanced Practice Providers (APPs -  Physician Assistants and Nurse Practitioners) who all work together to provide you with the care you need, when you need it.  We recommend signing up for the patient portal called "MyChart".  Sign up information is provided on this After Visit Summary.  MyChart is used to connect with patients for Virtual Visits (Telemedicine).  Patients are able to view lab/test results, encounter notes, upcoming appointments, etc.  Non-urgent messages can be sent to your provider as well.   To learn more about what you can do with MyChart, go to [email or web address].    Your next appointment:   KEEP SCHEDULED FOLLOW-UP  Important Information About Sugar

## 2022-02-16 NOTE — Progress Notes (Unsigned)
Enrolled for [employer] to mail a ZIO XT long term holter monitor to the patients address on file.   Dr. J. Branch to read. 

## 2022-02-17 NOTE — Telephone Encounter (Signed)
Transition Care Management Unsuccessful Follow-up Telephone Call  Date of discharge and from where:  02/11/22 -[place]  Attempts:  3rd Attempt  Reason for unsuccessful TCM follow-up call:  Left voice message  Unable to reach patient

## 2022-02-21 ENCOUNTER — Ambulatory Visit: Payer: Medicare HMO | Attending: Internal Medicine

## 2022-02-21 DIAGNOSIS — R2681 Unsteadiness on feet: Secondary | ICD-10-CM | POA: Insufficient documentation

## 2022-02-21 DIAGNOSIS — M6281 Muscle weakness (generalized): Secondary | ICD-10-CM | POA: Diagnosis not present

## 2022-02-21 NOTE — Therapy (Signed)
[facility] [address] [place], [place], [address] Phone: [phone]   Fax:  [phone]  Physical Therapy Evaluation  Patient Details  Name: Tammie Martin MRN: [id number] Date of Birth: [birth year] Referring Provider (PT): Jerral Ralph, MD   Encounter Date: 02/21/2022   PT End of Session - 02/21/22 1031     Visit Number 1    Number of Visits 3    Date for PT Re-Evaluation 03/25/22    PT Start Time 1032    PT Stop Time 1116    PT Time Calculation (min) 44 min    Activity Tolerance Patient tolerated treatment well    Behavior During Therapy [facility] for tasks assessed/performed             Past Medical History:  Diagnosis Date   Anxiety    Atrial fibrillation (HCC)    Cancer (HCC) 1979   cervical cancer   COPD (chronic obstructive pulmonary disease) (HCC)    Depression    DJD (degenerative joint disease)    History of cervical cancer    Hyperlipidemia    Hypertension    Hypothyroidism    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 Excell Seltzer and Dr. Laneta Simmers   Severe aortic stenosis    Thyroid disease    Tobacco abuse     Past Surgical History:  Procedure Laterality Date   AORTIC VALVE REPLACEMENT     APPENDECTOMY     pt recently had CT that said Appendix was there so she is unsure if this was actually removed during hysterectomy as she was previously told   BREAST ENHANCEMENT SURGERY     CARDIOVERSION N/A 10/27/2021   Procedure: CARDIOVERSION;  Surgeon: Antoine Poche, MD;  Location: [facility];  Service: Endoscopy;  Laterality: N/A;   INTRAOPERATIVE TRANSESOPHAGEAL ECHOCARDIOGRAM N/A 02/01/2022   Procedure: INTRAOPERATIVE TRANSESOPHAGEAL ECHOCARDIOGRAM;  Surgeon: Tonny Bollman, MD;  Location: [facility] OR;  Service: Open Heart Surgery;  Laterality: N/A;   RIGHT/LEFT HEART CATH AND CORONARY ANGIOGRAPHY N/A 11/22/2021   Procedure:  RIGHT/LEFT HEART CATH AND CORONARY ANGIOGRAPHY;  Surgeon: Tonny Bollman, MD;  Location: [facility] INVASIVE CV LAB;  Service: Cardiovascular;  Laterality: N/A;   TONSILLECTOMY     VAGINAL HYSTERECTOMY      There were no vitals filed for this visit.    Subjective Assessment - 02/21/22 1032     Subjective Patient reports that she had cardiac surgery on 02/01/22 which resulted on in her having a multiple strokes and seizures on 6/9.Marland Kitchen She feels that she has been getting better everyday. She feels that her memory is her biggest impairment, but her caregiver feels that this is not as bad as she thinks.    Patient is accompained by: Family member    Pertinent History a-fib,    Limitations Walking    How long can you walk comfortably? about 6 minutes    Patient Stated Goals clean her house, get stronger                [facility] PT Assessment - 02/21/22 0001       Assessment   Medical Diagnosis Unsteadiness on feet    Referring Provider (PT) Jerral Ralph, MD    Onset Date/Surgical Date 02/01/22    Next MD Visit 02/25/22   with PCP   Prior Therapy No      Precautions   Precautions None  Restrictions   Weight Bearing Restrictions No      Balance Screen   Has the patient fallen in the past 6 months No    Has the patient had a decrease in activity level because of a fear of falling?  No    Is the patient reluctant to leave their home because of a fear of falling?  No      Home Tourist information centre manager residence    Living Arrangements Other relatives    Home Access Elevator    Home Layout One level    Home Equipment Walker - 4 wheels      Prior Function   Level of Independence Independent    Leisure cleaning her house      Cognition   Overall Cognitive Status Within Functional Limits for tasks assessed    Attention Focused    Focused Attention Appears intact    Memory Appears intact    Awareness Appears intact    Problem Solving Appears intact      Sensation    Additional Comments Patient reports intermittent numbness in fingertips and toes, but none currently      ROM / Strength   AROM / PROM / Strength Strength      Strength   Strength Assessment Site Hip;Knee;Ankle    Right/Left Hip Right;Left    Right Hip Flexion 4-/5    Left Hip Flexion 4-/5    Right/Left Knee Right;Left    Right Knee Flexion 4+/5    Right Knee Extension 5/5    Left Knee Flexion 4+/5    Left Knee Extension 4+/5    Right/Left Ankle Right;Left    Right Ankle Dorsiflexion 4/5    Left Ankle Dorsiflexion 4/5      Transfers   Transfers Sit to Stand;Stand to Sit    Sit to Stand Without upper extremity assist    Five time sit to stand comments  15.38 seconds    Stand to Sit Without upper extremity assist      Ambulation/Gait   Ambulation/Gait Yes    Ambulation/Gait Assistance 6: Modified independent (Device/Increase time)    Assistive device None    Gait Pattern Step-through pattern;Narrow base of support      Balance   Balance Assessed Yes      Static Standing Balance   Static Standing Balance -  Activities  Romberg - Eyes Opened;Romberg - Eyes Closed;Tandam Stance - Right Leg;Tandam Stance - Left Leg    Static Standing - Comment/# of Minutes Romberg: 30 seconds each; Tandem: unable to complete                        Objective measurements completed on examination: See above findings.       [facility] Adult PT Treatment/Exercise - 02/21/22 0001       Exercises   Exercises Knee/Hip      Knee/Hip Exercises: Seated   Long Arc Quad Both;20 reps    Long Arc Quad Limitations green t-band at ankles    Clamshell with [employer]   20 reps   Marching Both;20 reps    Marching Limitations green t-band                          PT Long Term Goals - 02/21/22 1243       PT LONG TERM GOAL #1   Title Patient will be independent with her HEP.  Time 3    Period Weeks    Status New    Target Date 03/14/22      PT LONG TERM GOAL  #2   Title Patient will improve her five time sit to stand to 12 seconds or less to reduce her fall risk.    Time 3    Period Weeks    Status New    Target Date 03/14/22      PT LONG TERM GOAL #3   Title Patient will be able to ambulate without significant gait deviations for improved mobility.    Time 3    Period Weeks    Status New    Target Date 03/14/22                    Plan - 02/21/22 1236     Clinical Impression Statement Patient is a 71 year old female presenting to physical therapy with unsteadiness while walking. She is at an elevated fall risk due to her elevated five time sit to stand and her gait mechanics. She was provided a HEP which she was able to properly perform with minimal cueing. Recommend that she continue with skilled physical therapy to address her remaining impairments to return to her prior level of function.    Personal Factors and Comorbidities Transportation;Finances;Comorbidity 3+    Comorbidities HTN, history of CVA, a-fib, osteopenia, history of cancer, seizures    Examination-Activity Limitations Locomotion Level    Examination-Participation Restrictions Cleaning    Stability/Clinical Decision Making Stable/Uncomplicated    Clinical Decision Making Low    Rehab Potential Good    PT Frequency 1x / week   due to financial constraints, per patient request   PT Duration 3 weeks    PT Treatment/Interventions ADLs/Self Care Home Management;Gait training;Stair training;Functional mobility training;Therapeutic activities;Therapeutic exercise;Balance training;Neuromuscular re-education;Patient/family education    PT Next Visit Plan nustep, lower extremity strengthening, and update HEP (green t-band provided at initial evaluation.    PT Home Exercise Plan Access Code: [email or web address]  URL: [email or web address]  Date: 02/21/2022  Prepared by: Candi Leash    Exercises  - Seated Hip Abduction with Resistance  - 2 x daily - 7 x weekly - 2 sets -  10 reps  - Seated March with Resistance  - 2 x daily - 7 x weekly - 2 sets - 10 reps  - Sitting Knee Extension with Resistance  - 2 x daily - 7 x weekly - 2 sets - 10 reps    Consulted and Agree with Plan of Care Patient             Patient will benefit from skilled therapeutic intervention in order to improve the following deficits and impairments:  Abnormal gait, Difficulty walking, Decreased endurance, Decreased balance, Decreased strength, Decreased mobility  Visit Diagnosis: Unsteadiness on feet  Muscle weakness (generalized)     Problem List Patient Active Problem List   Diagnosis Date Noted   Acute metabolic encephalopathy 02/07/2022   Acute respiratory failure with hypoxia (HCC) 02/07/2022   Depression 02/07/2022   A-fib (HCC) 02/07/2022   Seizure (HCC) 02/07/2022   Encephalopathy    CVA (cerebral vascular accident) (HCC) 02/04/2022   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 [place], [place], [address] Phone: [phone]   Fax:  [phone]  Name: SAIDAH COWLES MRN: [id number] Date of Birth: 01-31-1951

## 2022-02-23 DIAGNOSIS — M9901 Segmental and somatic dysfunction of cervical region: Secondary | ICD-10-CM | POA: Diagnosis not present

## 2022-02-23 DIAGNOSIS — M9902 Segmental and somatic dysfunction of thoracic region: Secondary | ICD-10-CM | POA: Diagnosis not present

## 2022-02-23 DIAGNOSIS — M9903 Segmental and somatic dysfunction of lumbar region: Secondary | ICD-10-CM | POA: Diagnosis not present

## 2022-02-25 ENCOUNTER — Ambulatory Visit (INDEPENDENT_AMBULATORY_CARE_PROVIDER_SITE_OTHER): Payer: Medicare HMO

## 2022-02-25 ENCOUNTER — Encounter: Payer: Self-pay | Admitting: Family Medicine

## 2022-02-25 ENCOUNTER — Ambulatory Visit (INDEPENDENT_AMBULATORY_CARE_PROVIDER_SITE_OTHER): Payer: Medicare HMO | Admitting: Family Medicine

## 2022-02-25 VITALS — BP 114/83 | HR 85 | Temp 98.1°F | Ht 67.0 in | Wt 117.6 lb

## 2022-02-25 DIAGNOSIS — Z8673 Personal history of transient ischemic attack (TIA), and cerebral infarction without residual deficits: Secondary | ICD-10-CM

## 2022-02-25 DIAGNOSIS — G43009 Migraine without aura, not intractable, without status migrainosus: Secondary | ICD-10-CM

## 2022-02-25 DIAGNOSIS — R569 Unspecified convulsions: Secondary | ICD-10-CM

## 2022-02-25 MED ORDER — NURTEC 75 MG PO TBDP: ORAL_TABLET | ORAL | 0 refills | Status: DC

## 2022-02-25 NOTE — Progress Notes (Signed)
Subjective: CC:Hospital discharge follow PCP: Tammie Norlander, DO Tammie Martin is a 71 y.o. female presenting to clinic today for:  1. 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  This was thought to be secondary to recent CVA and she was subsequently placed on high-dose Keppra.  Her last EEG was unremarkable.  She will continue to follow-up with outpatient neurology.  She is coming today's visit by her daughter.  She notes that her mother has been struggling with hallucinations since initiation of Keppra but they seem to be resolving.  Upon further questioning her mother does note that she continues to have some intermittent visual disturbance but she is not sure if this is related to her underlying ocular issues or if this is something separate.  Her daughter specifically notes that she was "pending shiny kitties" at 1 point.  This type of behavior has actually resolved.  Patient is compliant with all meds.  She has been compliant with stopping smoking cigarettes but has continued to intermittently smoke marijuana, her children really have been trying to trigger this behavior as this was advised against by her specialist in the hospital.  She notes that this is what allows her headaches to resolve.  She is been reluctant to take too much Tylenol because she does not want to "hurt her kidneys".  She reports some associated photophobia and phonophobia and the headache can last several hours and when she smokes marijuana.  She subsequently will go to sleep and the headache will be resolved.   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 CBurt Knackand Dr. BCyndia Bent  Severe aortic stenosis    Thyroid disease    Tobacco abuse     Current Outpatient Medications:    acetaminophen (TYLENOL) 500 MG tablet, Take 1,000 mg by mouth every 6 (six) hours as needed for moderate pain., Disp: , Rfl:    albuterol (VENTOLIN HFA) 108 (90 Base) MCG/ACT inhaler, Inhale 2 puffs into the lungs every 6 (six) hours as needed for wheezing or shortness of breath., Disp: 8 g, Rfl: 0   atorvastatin (LIPITOR) 20 MG tablet, Take 2 tablets (40 mg total) by mouth daily., Disp: 60 tablet, Rfl: 3   atorvastatin (LIPITOR) 40 MG tablet, Take 40 mg by mouth daily., Disp: , Rfl:    azithromycin (ZITHROMAX) 500 MG tablet, Take 1 tablet (500 mg total) by mouth as directed. TAKE 500 MG  1 HOUR PRIOR TO ANY DENTAL  CLEANINGS AND PROCEDURES., Disp: 4 tablet, Rfl: 6   bisoprolol (ZEBETA) 10 MG tablet, Take 1.5 tablets (15 mg total) by mouth daily., Disp: 135 tablet, Rfl: 3   CALCIUM-MAGNESIUM PO,  Take 1 tablet by mouth daily., Disp: , Rfl:    cetirizine (ZYRTEC) 10 MG tablet, Take 10 mg by mouth daily., Disp: , Rfl:    Cholecalciferol (VITAMIN D) 50 MCG (2000 UT) tablet, Take 2,000 Units by mouth daily., Disp: , Rfl:    desvenlafaxine (PRISTIQ) 50 MG 24 hr tablet, Take 1 tablet (50 mg total) by mouth daily., Disp: 90 tablet, Rfl: 1   ELIQUIS 5 MG TABS tablet, TAKE ONE TABLET BY MOUTH TWICE DAILY, Disp: 60 tablet, Rfl: 0   fluticasone (FLONASE) 50 MCG/ACT nasal spray, Place 2 sprays into both nostrils daily as needed for allergies or rhinitis., Disp: 48 g, Rfl: 3   furosemide (LASIX) 20 MG tablet, TAKE ONE TABLET BY MOUTH DAILY, Disp:  30 tablet, Rfl: 0   hydrocortisone cream 1 %, Apply 1 application. topically 2 (two) times daily as needed (rash)., Disp: , Rfl:    levETIRAcetam (KEPPRA) 750 MG tablet, Take 2 tablets (1,500 mg total) by mouth 2 (two) times daily., Disp: 60 tablet, Rfl: 0   levothyroxine (SYNTHROID) 88 MCG tablet, Take 1 tablet by mouth daily., Disp: 90 tablet, Rfl: 2   Multiple Vitamins-Minerals (ALIVE WOMENS 50+ PO), Take 1 tablet by mouth daily., Disp: , Rfl:    Multiple Vitamins-Minerals (PRESERVISION AREDS 2 PO), Take 1 capsule by mouth in the morning and at bedtime., Disp: , Rfl:    Omega-3-6-9 CAPS, Take 1 capsule by mouth 2 (two) times daily., Disp: , Rfl:    Polyethyl Glycol-Propyl Glycol (SYSTANE OP), Place 1 drop into both eyes daily as needed (dry eyes)., Disp: , Rfl:    potassium chloride SA (KLOR-CON M) 10 MEQ tablet, Take 1 tablet (10 mEq total) by mouth daily., Disp: 30 tablet, Rfl: 3   Probiotic Product (PROBIOTIC ADVANCED PO), Take 1 capsule by mouth daily., Disp: , Rfl:    traZODone (DESYREL) 50 MG tablet, Take 0.5-1 tablets (25-50 mg total) by mouth at bedtime as needed for sleep. (Patient taking differently: Take 50 mg by mouth at bedtime as needed for sleep.), Disp: 90 tablet, Rfl: 3   TURMERIC PO, Take 1 capsule by mouth 2 (two) times daily., Disp: , Rfl:    umeclidinium-vilanterol (ANORO ELLIPTA) 62.5-25 MCG/ACT AEPB, Inhale 1 puff into the lungs daily at 6 (six) AM., Disp: 60 each, Rfl: 12 Social History   Socioeconomic History   Marital status: Widowed    Spouse name: Not on file   Number of children: 2   Years of education: 14   Highest education level: Associate degree: academic program  Occupational History   Occupation: Retired    Comment: Equities trader  Tobacco Use   Smoking status: Former    Packs/day: 1.00    Years: 40.00    Total pack years: 40.00    Types: Cigarettes   Smokeless tobacco: Never  Vaping Use   Vaping Use: Never used  Substance and Sexual Activity    Alcohol use: Not Currently   Drug use: No   Sexual activity: Not Currently  Other Topics Concern   Not on file  Social History Narrative   Lives alone - one cat, one dog   Social Determinants of Health   Financial Resource Strain: Low Risk  (06/03/2021)   Overall Financial Resource Strain (CARDIA)    Difficulty of Paying Living Expenses: Not hard at all  Food Insecurity: No Food Insecurity (06/03/2021)   Hunger Vital Sign    Worried About Running Out of Food in the Last Year:  Never true    Ran Out of Food in the Last Year: Never true  Transportation Needs: No Transportation Needs (06/03/2021)   PRAPARE - Hydrologist (Medical): No    Lack of Transportation (Non-Medical): No  Physical Activity: Inactive (06/03/2021)   Exercise Vital Sign    Days of Exercise per Week: 0 days    Minutes of Exercise per Session: 0 min  Stress: No Stress Concern Present (06/03/2021)   [place]    Feeling of Stress : Not at all  Social Connections: Moderately Isolated (06/03/2021)   Social Connection and Isolation Panel [NHANES]    Frequency of Communication with Friends and Family: More than three times a week    Frequency of Social Gatherings with Friends and Family: More than three times a week    Attends Religious Services: Never    Marine scientist or Organizations: Yes    Attends Music therapist: More than 4 times per year    Marital Status: Widowed  Intimate Partner Violence: Not At Risk (06/03/2021)   Humiliation, Afraid, Rape, and Kick questionnaire    Fear of Current or Ex-Partner: No    Emotionally Abused: No    Physically Abused: No    Sexually Abused: No   Family History  Problem Relation Age of Onset   Cancer Mother        breast cancer at 100    Kidney disease Mother    Cancer Maternal Aunt        colon cancer   Heart attack Father    Diabetes Sister    Obesity  Sister    Depression Sister    Alcohol abuse Brother    Cirrhosis Brother    Depression Brother    Liver disease Brother    Diabetes Daughter    Fibromyalgia Daughter    Depression Daughter    Anxiety disorder Daughter    Arthritis Daughter    Depression Sister    Obesity Sister     Objective: Office vital signs reviewed. BP 114/83   Pulse 85   Temp 98.1 F (36.7 C)   Ht 5' 7" (1.702 m)   Wt 117 lb 9.6 oz (53.3 kg)   SpO2 97%   BMI 18.42 kg/m   Physical Examination:  General: Awake, alert, well nourished, No acute distress HEENT:sclera white, MMM Cardio: regular rate and rhythm, S1S2 heard, no murmurs appreciated Pulm: clear to auscultation bilaterally, no wheezes, rhonchi or rales; normal work of breathing on room air MSK :she is ambulating independently. Neuro: Short-term memory is affected.  Speech is otherwise normal.  She does not appear to be responding to internal stimuli.  Assessment/ Plan: 71 y.o. female   History of embolic stroke - Plan: CBC, CMP14+EGFR, DG Chest 2 View, Rimegepant Sulfate (NURTEC) 75 MG TBDP  Seizures (HCC) - Plan: CBC, CMP14+EGFR, DG Chest 2 View  Migraine without aura and without status migrainosus, not intractable - Plan: Rimegepant Sulfate (NURTEC) 75 MG TBDP  I reviewed her hospital discharge summary and recommendations.  Appropriate labs and imaging have been ordered as per recommendations.  I will CC her chart to neurology, whom she is seeing in the next week and a half.  Uncertain if she is having some hallucinations at this point but she certainly suffered from them early after initiation of Keppra.  It sounds like she is having some migraine headaches, likely secondary to the embolic  stroke that she suffered.  I have given her Nurtec if needed for breakthrough migraine headaches not relieved by Tylenol.  We will avoid triptans in this patient who has suffered a stroke.  We discussed max doses of Tylenol.  Again, deterred against use  of marijuana   No orders of the defined types were placed in this encounter.  No orders of the defined types were placed in this encounter.    Tammie Norlander, DO [place] [phone]

## 2022-02-25 NOTE — Progress Notes (Deleted)
[facility] [place]                                     Cardiology Office Note:    Date:  02/25/2022   ID:  Tammie Martin, DOB 12-23-1950, MRN [id number]  PCP:  Janora Norlander, DO  [place] Cardiologist: Carlyle Dolly, MD/ Dr. Burt Knack, MD & Dr. Cyndia Bent, MD (TAVR) [facility] Electrophysiologist:  None   Referring MD: Janora Norlander, DO   No chief complaint on file. ***  History of Present Illness:    Tammie Martin is a 71 y.o. female with a hx of persistent atrial fibrillation, aortic stenosis s/p recent TAVR, COPD, history of mild to moderate coronary artery disease, hypertension, and hyperlipidemia who underwent TAVR 02/01/22 and was unfortunately re-admitted two days after discharge with CVA and AF with RVR.    Tammie Martin is followed by Dr. [name]. She was first diagnosed with a heart murmur in 2017 and was found to have mild AS at that time. Patient was diagnosed with atrial fibrillation in 09/2021, and underwent echocardiogram on 10/20/21 that showed EF 60-65%, mild LVH, low normal RV systolic function, severely elevated pulmonary artery systolic pressure, and moderate to severe low flow low gradient aortic stenosis. Patient underwent elective cardioversion for her atrial fibrillation, remained in SR for about a week, and then had recurrence of afib. Her atrial fibrillation will be managed medically with a rate control/anticoagulation strategy.    She was referred to Dr. Burt Knack for evaluation of aortic stenosis on 11/30/21. At that appointment, she reported she was having progressive symptoms including limitation with exertional dyspnea, occasional chest burning. Due to these symptoms and her echocardiogram findings, patient was scheduled for TAVR. Underwent TAVR with S3UR 61m on 02/01/22. She tolerated the procedure well and was discharged 02/02/22.    [facility] acute onset of AF  with RVR and neuro changes found to have an embolic CVA by MRI. 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     She was once again admitted 02/07/22 with AMS and seizure activity. Neurology was consulted and followed with her during her hospitalization. She was started on Keppra.    Today she presents with her sister. She has been stable since discharge. She is having mostly cognitive difficulty with memory issues and word finding however this seems to be mild. She has been doing well physically and is able to ambulate without assistance although is rather weak. She has noticed improvement in her exertional SOB after TAVR. She denies chest pain, LE edema, orthopnea, dizziness, or syncope. She does report increased palpitations at times. Her HR today is 52bpm.      Severe AS: s/p successful TAVR with a 23 mm Edwards Sapien 3 UR via the TF  approach on 02/01/22. Post operative echo with normal prothesis, mean gradient at 556mg and mild paravalvular leak. Patient doing well with improved symptoms. Continue Eliquis given persistent AF. Groin sites healing well with no issues. SBE discussed and RX'ed with Azithromycin given PCN allergy. Plan one month follow up with echocardiogram.    Palpitations with known AF: C/o intermittent palpitations which I suspect is her AF. Currently on bisoprolol due  to COPD and HR in the low 50's today with soft BP. ZIO placed after last follow up with results pending. Once resulted, I will call her with any adjustments needed.    Hx of acute CVA:  Felt to be secondary to left atrial appendage thrombus seen on intraoperative TEE on 6/6.  Suspicion that she may have developed this thrombus when she was off Eliquis pre-TAVR. Continue Eliquis. No new neuro changes. Struggling with memory and word finding. Has follow up with neurology  early July. Asking about speech therapy.    Seizure disorder: In the setting of recent CVA. Seen by neurology and placed on Keppra with no recurrent episodes after discharge. Has follow up with neurology early July.     HTN: Soft BP today with no symptoms. Continue bisoprolol for now. May need to adjust regimen based on ZIO results.    COPD with ongoing tobacco use: Continue bronchodilators. 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 CBurt Knackand Dr. BCyndia Bent  Severe aortic stenosis    Thyroid disease    Tobacco abuse     Past Surgical History:  Procedure Laterality Date   AORTIC VALVE REPLACEMENT     APPENDECTOMY     pt recently had CT that said Appendix was there so she is unsure if this was actually removed during hysterectomy as she was previously told   BREAST ENHANCEMENT SURGERY     CARDIOVERSION N/A 10/27/2021   Procedure: CARDIOVERSION;  Surgeon: BArnoldo Lenis MD;  Location: [facility];  Service: Endoscopy;  Laterality: N/A;   INTRAOPERATIVE TRANSESOPHAGEAL ECHOCARDIOGRAM N/A 02/01/2022   Procedure: INTRAOPERATIVE TRANSESOPHAGEAL ECHOCARDIOGRAM;  Surgeon: CSherren Mocha MD;  Location: [facility]  Service: Open Heart Surgery;  Laterality: N/A;   RIGHT/LEFT HEART CATH AND CORONARY ANGIOGRAPHY N/A 11/22/2021   Procedure: RIGHT/LEFT HEART CATH AND CORONARY ANGIOGRAPHY;  Surgeon: CSherren Mocha MD;  [facility];  Service: Cardiovascular;  Laterality: N/A;   TONSILLECTOMY     VAGINAL HYSTERECTOMY      Current  Medications: No outpatient medications have been marked as taking for the 02/28/22 encounter (Appointment) with [facility].     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 Tobacco Use   Smoking status: Former    Packs/day: 1.00    Years: 40.00    Total pack years: 40.00    Types: Cigarettes   Smokeless tobacco: Never  Vaping Use   Vaping Use: Never used  Substance and Sexual Activity   Alcohol use: Not Currently   Drug use: No   Sexual activity: Not Currently  Other Topics Concern  Not on file  Social History Narrative   Lives alone - one cat, one dog   Social Determinants of Health   Financial Resource Strain: Low Risk  (06/03/2021)   Overall Financial Resource Strain (CARDIA)    Difficulty of Paying Living Expenses: Not hard at all  Food Insecurity: No Food Insecurity (06/03/2021)   Hunger Vital Sign    Worried About Running Out of Food in the Last Year: Never true    Ran Out of Food in the Last Year: Never true  Transportation Needs: No Transportation Needs (06/03/2021)   PRAPARE - Hydrologist (Medical): No    Lack of Transportation (Non-Medical): No  Physical Activity: Inactive (06/03/2021)   Exercise Vital Sign    Days of Exercise per Week: 0 days    Minutes of Exercise per Session: 0 min  Stress: No Stress Concern Present (06/03/2021)   [place]    Feeling of Stress : Not at all  Social Connections: Moderately Isolated (06/03/2021)   Social Connection and Isolation Panel [NHANES]    Frequency of Communication with  Friends and Family: More than three times a week    Frequency of Social Gatherings with Friends and Family: More than three times a week    Attends Religious Services: Never    Marine scientist or Organizations: Yes    Attends Music therapist: More than 4 times per year    Marital Status: Widowed     Family History: The patient's ***family history includes Alcohol abuse in her brother; Anxiety disorder in her daughter; Arthritis in her daughter; Cancer in her maternal aunt and mother; Cirrhosis in her brother; Depression in her brother, daughter, sister, and sister; Diabetes in her daughter and sister; Fibromyalgia in her daughter; Heart attack in her father; Kidney disease in her mother; Liver disease in her brother; Obesity in her sister and sister.  ROS:   Please see the history of present illness.    All other systems reviewed and are negative.  EKGs/Labs/Other Studies Reviewed:    The following studies were reviewed today:    TAVR OPERATIVE NOTE     Date of Procedure:                02/01/2022   Preoperative Diagnosis:      Severe Aortic Stenosis    Postoperative Diagnosis:    Same    Procedure:        Transcatheter Aortic Valve Replacement - Percutaneous  Transfemoral Approach             Edwards Sapien 3 Ultra ResiliaTHV (size 23 mm, serial # [id number])              Co-Surgeons:                        Gaye Pollack, MD and Sherren Mocha, MD   Anesthesiologist:                  Hoy Morn, MD   Echocardiographer:              Marry Guan, MD   Pre-operative Echo Findings: Severe aortic stenosis Normal left ventricular systolic function   Post-operative Echo Findings: Trivial paravalvular leak Normal left ventricular systolic function     Echocardiogram 02/02/2022:   1. 23 mm S3. Mild paravalvular leak. Vmax 1.6 m/s, MG 5.0 mmHG, EOA 2.0  cm2, DI 0.55. Prosthesis within normal limits. The aortic valve has been  repaired/replaced. Aortic  valve regurgitation is mild. There is a 23 mm  Sapien prosthetic (TAVR) valve  present in the aortic position. Procedure Date: 01/01/2022.   2. Left ventricular ejection fraction, by estimation, is 60 to 65%. The  left ventricle has normal function. The left ventricle has no regional  wall motion abnormalities. Left ventricular diastolic function could not  be evaluated.   3. Right ventricular systolic function is normal. The right ventricular  size is mildly enlarged. There is severely elevated pulmonary artery  systolic pressure.   4. Left atrial size was severely dilated.   5. Right atrial size was severely dilated.   6. The mitral valve is degenerative. Trivial mitral valve regurgitation.  Mild mitral stenosis. The mean mitral valve gradient is 4.0 mmHg with  average heart rate of 80 bpm. Moderate to severe mitral annular  calcification.   7. The inferior vena cava is dilated in size with <50% respiratory  variability, suggesting right atrial pressure of 15 mmHg.       EKG:  EKG is *** ordered today.  The ekg ordered today demonstrates ***  Recent Labs: 09/16/2021: BNP 338.5 02/05/2022: TSH 0.399 02/10/2022: ALT 34; Hemoglobin 13.4; Magnesium 2.0; Platelets 175 02/11/2022: BUN 14; Creatinine, Ser 0.67; Potassium 4.2; Sodium 138  Recent Lipid Panel    Component Value Date/Time   CHOL 133 02/05/2022 0941   CHOL 144 04/15/2021 1328   TRIG 89 02/05/2022 0941   HDL 36 (L) 02/05/2022 0941   HDL 41 04/15/2021 1328   CHOLHDL 3.7 02/05/2022 0941   VLDL 18 02/05/2022 0941   LDLCALC 79 02/05/2022 0941   LDLCALC 84 04/15/2021 1328     Risk Assessment/Calculations:   {Does this patient have ATRIAL FIBRILLATION?[phone]}   Physical Exam:    VS:  There were no vitals taken for this visit.    Wt Readings from Last 3 Encounters:  02/25/22 117 lb 9.6 oz (53.3 kg)  02/16/22 111 lb (50.3 kg)  02/11/22 117 lb 4.6 oz (53.2 kg)     GEN: *** Well nourished, well developed in no  acute distress HEENT: Normal NECK: No JVD; No carotid bruits LYMPHATICS: No lymphadenopathy CARDIAC: ***RRR, no murmurs, rubs, gallops RESPIRATORY:  Clear to auscultation without rales, wheezing or rhonchi  ABDOMEN: Soft, non-tender, non-distended MUSCULOSKELETAL:  No edema; No deformity  SKIN: Warm and dry NEUROLOGIC:  Alert and oriented x 3 PSYCHIATRIC:  Normal affect   ASSESSMENT:    No diagnosis found. PLAN:    In order of problems listed above:       {Are you ordering a CV Procedure (e.g. stress test, cath, DCCV, TEE, etc)?   Press F2        :[id number]}    Medication Adjustments/Labs and Tests Ordered: Current medicines are reviewed at length with the patient today.  Concerns regarding medicines are outlined above.  No orders of the defined types were placed in this encounter.  No orders of the defined types were placed in this encounter.   There are no Patient Instructions on file for this visit.   Signed, Kathyrn Drown, NP  02/25/2022 2:11 PM    [facility]

## 2022-02-25 NOTE — Patient Instructions (Addendum)
Tylenol if needed for migraine headache.  NO more than 1 gram ('1000mg'$ ) every 6 hours IF needed for headache.   IF migraine is not relieved by Tylenol, use the Nurtec pill.  Max 1 Nurtec per day Abstain from marijuana

## 2022-02-26 LAB — CMP14+EGFR
ALT: 67 IU/L — ABNORMAL HIGH (ref 0–32)
AST: 70 IU/L — ABNORMAL HIGH (ref 0–40)
Albumin/Globulin Ratio: 1.3 (ref 1.2–2.2)
Albumin: 4.1 g/dL (ref 3.7–4.7)
Alkaline Phosphatase: 160 IU/L — ABNORMAL HIGH (ref 44–121)
BUN/Creatinine Ratio: 17 (ref 12–28)
BUN: 12 mg/dL (ref 8–27)
Bilirubin Total: 0.3 mg/dL (ref 0.0–1.2)
CO2: 27 mmol/L (ref 20–29)
Calcium: 10.3 mg/dL (ref 8.7–10.3)
Chloride: 97 mmol/L (ref 96–106)
Creatinine, Ser: 0.7 mg/dL (ref 0.57–1.00)
Globulin, Total: 3.2 g/dL (ref 1.5–4.5)
Glucose: 100 mg/dL — ABNORMAL HIGH (ref 70–99)
Potassium: 4.5 mmol/L (ref 3.5–5.2)
Sodium: 139 mmol/L (ref 134–144)
Total Protein: 7.3 g/dL (ref 6.0–8.5)
eGFR: 91 mL/min/{1.73_m2} (ref >59)

## 2022-02-26 LAB — CBC
Hematocrit: 42.8 % (ref 34.0–46.6)
Hemoglobin: 14.6 g/dL (ref 11.1–15.9)
MCH: 32.2 pg (ref 26.6–33.0)
MCHC: 34.1 g/dL (ref 31.5–35.7)
MCV: 94 fL (ref 79–97)
Platelets: 182 10*3/uL (ref 150–450)
RBC: 4.54 x10E6/uL (ref 3.77–5.28)
RDW: 12.1 % (ref 11.7–15.4)
WBC: 9.3 10*3/uL (ref 3.4–10.8)

## 2022-02-28 ENCOUNTER — Encounter: Payer: Self-pay | Admitting: Nurse Practitioner

## 2022-02-28 ENCOUNTER — Telehealth: Payer: Self-pay

## 2022-02-28 ENCOUNTER — Ambulatory Visit (INDEPENDENT_AMBULATORY_CARE_PROVIDER_SITE_OTHER): Payer: Medicare HMO | Admitting: Nurse Practitioner

## 2022-02-28 ENCOUNTER — Telehealth: Payer: Self-pay | Admitting: Cardiology

## 2022-02-28 ENCOUNTER — Ambulatory Visit (INDEPENDENT_AMBULATORY_CARE_PROVIDER_SITE_OTHER): Payer: Medicare HMO

## 2022-02-28 ENCOUNTER — Encounter (HOSPITAL_COMMUNITY): Payer: Self-pay

## 2022-02-28 ENCOUNTER — Ambulatory Visit: Payer: Medicare HMO

## 2022-02-28 ENCOUNTER — Ambulatory Visit (HOSPITAL_COMMUNITY): Payer: Medicare HMO

## 2022-02-28 VITALS — BP 88/62 | HR 73 | Temp 97.4°F | Resp 20

## 2022-02-28 DIAGNOSIS — M25562 Pain in left knee: Secondary | ICD-10-CM

## 2022-02-28 DIAGNOSIS — S0083XA Contusion of other part of head, initial encounter: Secondary | ICD-10-CM

## 2022-02-28 DIAGNOSIS — M25462 Effusion, left knee: Secondary | ICD-10-CM | POA: Diagnosis not present

## 2022-02-28 MED ORDER — TRAMADOL HCL 50 MG PO TABS: 50.0000 mg | ORAL_TABLET | Freq: Three times a day (TID) | ORAL | 0 refills | Status: AC | PRN

## 2022-02-28 NOTE — Telephone Encounter (Signed)
Tammie Martin [name] - PA Case ID: [id number] - Rx #: [id number] help? Call us at [phone] Status Sent to [place] '75MG'$  dispersible tablets Form [employer] Electronic PA Form [phone] NCPDP) [place] [phone]

## 2022-02-28 NOTE — Progress Notes (Signed)
   Subjective:    Patient ID: Tammie Martin, female    DOB: Jul 29, 1951, 71 y.o.   MRN: [id number]   Chief Complaint: fall  Fall Pertinent negatives include no abdominal pain or headaches.   Patient fell outside and landed on her left knee and face. Face is scratched up and bruised but that is ok. Her left knee is swollen and painful to walk on.    Review of Systems  Constitutional:  Negative for diaphoresis.  Eyes:  Negative for pain.  Respiratory:  Negative for shortness of breath.   Cardiovascular:  Negative for chest pain, palpitations and leg swelling.  Gastrointestinal:  Negative for abdominal pain.  Endocrine: Negative for polydipsia.  Skin:  Negative for rash.  Neurological:  Negative for dizziness, weakness and headaches.  Hematological:  Does not bruise/bleed easily.  All other systems reviewed and are negative.      Objective:   Physical Exam Vitals reviewed.  Constitutional:      Appearance: Normal appearance.  Cardiovascular:     Rate and Rhythm: Normal rate and regular rhythm.     Heart sounds: Normal heart sounds.  Pulmonary:     Effort: Pulmonary effort is normal.     Breath sounds: Normal breath sounds.  Musculoskeletal:     Comments: Left mild knee effusion.  Limited extension due to pain Ligaments appear intact  Skin:    General: Skin is warm.     Comments: Facial scratches and contusions on nose and upper lip. Left knee abrasion Mild knee effusion with pain on flexion and extension.  Neurological:     General: No focal deficit present.     Mental Status: She is alert and oriented to person, place, and time.  Psychiatric:        Mood and Affect: Mood normal.      BP (!) 88/62   Pulse 73   Temp (!) 97.4 F (36.3 C) (Temporal)   Resp 20   Left knee xray- no fracture visible-Preliminary reading by Ronnald Collum, FNP  [facility]       Assessment & Plan:  Tammie Martin in today with chief complaint of Fall   1. Acute pain of left  knee Ice Elevated Keep wrapped - DG Knee 1-2 Views Left  2. Contusion of face, initial encounter Keep face clean with antibacteral soap Do not pick or scratch at area RTO prn    The above assessment and management plan was discussed with the patient. The patient verbalized understanding of and has agreed to the management plan. Patient is aware to call the clinic if symptoms persist or worsen. Patient is aware when to return to the clinic for a follow-up visit. Patient educated on when it is appropriate to go to the emergency department.   Mary-Margaret Hassell Done, FNP

## 2022-02-28 NOTE — Telephone Encounter (Signed)
Tammie Martin [name] - PA Case ID: [id number] - Rx #: [id number] help? Call us at [phone] Outcome Approvedtoday Your request has been approved Drug [facility] '75MG'$  dispersible tablets Form [employer] Electronic PA Form [phone] NCPDP) [place] [phone]

## 2022-02-28 NOTE — Patient Instructions (Signed)
Facial or Scalp Contusion A facial or scalp contusion is a bruise (contusion) on the face or head. Contusions are the result of a direct force (blunttrauma) to the face or scalp that has caused bleeding under the skin. The contusion may turn blue, purple, or yellow (discoloration). Minor injuries may cause a painless contusion, but more severe contusions may stay painful and swollen for a few weeks. Injuries to the face and head generally cause a lot of swelling and discoloration, especially around the eyes. Contusions by themselves are generally not life threatening. You may have a contusion along with other injuries, such as broken bones (fractures), cuts, and injuries to blood vessels. What are the causes? A facial or scalp contusion is caused by a injury, fall, or trauma to the face or head area. Contusions may result from: Motor vehicle accidents. Sports injuries. Assault injuries. What are the signs or symptoms? Symptoms of this condition include: Swelling of the injured area. The swelling may be in a small area (localized) and very noticeable. Discoloration of the injured area. Tenderness, soreness, or pain in the injured area. In addition to symptoms of contusions, if you have facial fractures, you may have a change in the appearance of your nose, may not be able to close your mouth, and may have vision changes. How is this diagnosed? This condition is diagnosed based on your medical history and a physical exam. An X-ray exam, CT scan, or MRI may be needed to check for any additional injuries. In some cases, your health care provider may need to do more examinations of your nose, eyes, or jaw. How is this treated? Often, the best treatment for a facial or scalp contusion is applying cold compresses to the injured area. Over-the-counter medicines may also be recommended to help relieve pain. If there are any cuts (lacerations), these will need to be repaired as well. Any deeper injuries may  require treatment and follow up with a specialist, such as a facial surgeon or an eye specialist (ophthalmologist). Follow these instructions at home: Managing pain, stiffness, and swelling  If directed, put ice on the injured area. To do this: Put ice in a plastic bag. Place a towel between your skin and the bag. Leave the ice on for 20 minutes, 2-3 times a day. Remove the ice if your skin turns bright red. This is very important. If you cannot feel pain, heat, or cold, you have a greater risk of damage to the area. Raise (elevate) the injured area above the level of your heart while you are sitting or lying down. General instructions Take over-the-counter and prescription medicines only as told by your health care provider. Rest as told by your health care provider. Return to your normal activities as told by your health care provider. Ask your health care provider what activities are safe for you. Do not blow your nose if you have any facial fractures. Eat soft foods if you are having jaw pain. Keep all follow-up visits. This is important. Contact a health care provider if: You have trouble biting or chewing. Your pain or swelling gets worse. The discolored area gets much worse. Get help right away if: You have severe pain or a headache that is not relieved by medicine. You have unusual sleepiness, confusion, or personality changes. You vomit. You have a nosebleed that does not stop. You have double vision or blurred vision. You have clear fluid draining from your nose or ear, and it does not go away. You have  trouble walking or using your arms or legs. You have severe dizziness. Summary A facial or scalp contusion is a bruise (contusion) on the face or head. Contusions are the result of an injury that caused bleeding and swelling under the skin. Minor contusions will have mild symptoms, but more severe contusions may stay painful and swollen for a few weeks. Some facial and head  contusions may be associated with other more serious injuries. Go to a health care provider if you have vision changes, bleeding from your face or nose, or you are not able to to bite down normally. Often, the best treatment for a facial or scalp contusion is applying cold compresses to the injured area. This information is not intended to replace advice given to you by your health care provider. Make sure you discuss any questions you have with your health care provider. Document Revised: 09/21/2020 Document Reviewed: 09/21/2020 Elsevier Patient Education  [place].

## 2022-02-28 NOTE — Telephone Encounter (Signed)
   Echocardiogram team contacted myself about Tammie Martin sustaining a mechanical fall yesterday afternoon and will be unable to make her appointment today. Personally spoke to Tammie Martin and her daughter on the phone this morning. She is unable to bear weight on her right leg currently and is in significant pain. She is scheduled to see MD at [facility] today for imaging. Will reschedule 1 month echo and OV with SH and call Wednesday to confirm.   Tammie Drown NP-C Structural Heart Team  Pager: [phone] Phone: [phone]

## 2022-02-28 NOTE — Telephone Encounter (Signed)
Tessie Bentsen [name] - PA Case ID: [id number] - Rx #: [id number] help? Call us at [phone] Status Sent to [place] '75MG'$  dispersible tablets Form [employer] Electronic PA Form [phone] NCPDP) [place] [id number]

## 2022-03-02 ENCOUNTER — Encounter: Payer: Medicare HMO | Admitting: Physical Therapy

## 2022-03-08 ENCOUNTER — Encounter: Payer: Self-pay | Admitting: Neurology

## 2022-03-08 ENCOUNTER — Telehealth: Payer: Self-pay | Admitting: *Deleted

## 2022-03-08 ENCOUNTER — Ambulatory Visit: Payer: Medicare HMO | Admitting: Neurology

## 2022-03-08 VITALS — BP 109/78 | HR 84 | Ht 67.0 in | Wt 114.0 lb

## 2022-03-08 DIAGNOSIS — G40209 Localization-related (focal) (partial) symptomatic epilepsy and epileptic syndromes with complex partial seizures, not intractable, without status epilepticus: Secondary | ICD-10-CM | POA: Diagnosis not present

## 2022-03-08 DIAGNOSIS — Z5181 Encounter for therapeutic drug level monitoring: Secondary | ICD-10-CM

## 2022-03-08 DIAGNOSIS — W19XXXD Unspecified fall, subsequent encounter: Secondary | ICD-10-CM

## 2022-03-08 NOTE — Telephone Encounter (Signed)
Laurine Blazer, LPN  05/04/8933  0:68 PM EDT Back to Top    Notified daughter Allayne Butcher), copy to pcp.

## 2022-03-08 NOTE — Progress Notes (Signed)
[facility]  PATIENT: Tammie LEFEBER DOB: Nov 23, 1950  REQUESTING CLINICIAN: Jonetta Osgood, MD HISTORY FROM: Patient and sister  REASON FOR VISIT: Follow up seizure, recent CVA    HISTORICAL  CHIEF COMPLAINT:  Chief Complaint  Patient presents with   Hospitalization Follow-up    Room 12, with sister  NP Hosp f/u seizures and CVA pt states balance is off, c/o hearing loss,refills needed for keppra     HISTORY OF PRESENT ILLNESS:  This is a 71 year old woman past medical history of atrial fibrillation on Eliquis, hypertension hyperlipidemia COPD who recently had a TAVR on 6/6 which was complicated by multiple embolic strokes.  The strokes were multiple and located in the left frontal cortex, left posterior temporal cortex and bilateral cerebellar related to the left atrial appendage thrombus.  Her Eliquis was resumed and patient discharged home.  She presented to the hospital on 6/12 with episode of altered mental status. While in the ED, she was noted to have a generalized tonic-clonic seizure that was stopped with Ativan.  At that time she was loaded on Keppra and transferred to [facility].  While in [place] she did have a seizure captured on EEG.  Keppra was continued, up to 1500 mg twice daily.  Since discharge from the hospital, she has not had any additional seizures but report episodes of confusion about the recent hospitalization, there is also hallucinations after leaving the hospital but those hallucinations resolved.  Sister reported that patient has memory problems she is forgetful, she is irritable and 2 weeks ago she had a fall and hit her face forward, had severe bruising in the on the face.   OTHER MEDICAL CONDITIONS: Atrial fibrillation, hypertension, hyperlipidemia, COPD, recent multiple embolic stroke and seizure disorder.   REVIEW OF SYSTEMS: Full 14 system review of systems performed and negative with exception of: As noted in the  HPI  ALLERGIES: Allergies  Allergen Reactions   Penicillins Hives   Singulair [Montelukast Sodium]     headache   Codeine Rash   Nicoderm [Nicotine] Hives and Swelling   Other Rash    Band-aids cause rash after a few days    HOME MEDICATIONS: Outpatient Medications Prior to Visit  Medication Sig Dispense Refill   acetaminophen (TYLENOL) 500 MG tablet Take 1,000 mg by mouth every 6 (six) hours as needed for moderate pain.     albuterol (VENTOLIN HFA) 108 (90 Base) MCG/ACT inhaler Inhale 2 puffs into the lungs every 6 (six) hours as needed for wheezing or shortness of breath. 8 g 0   atorvastatin (LIPITOR) 20 MG tablet Take 2 tablets (40 mg total) by mouth daily. 60 tablet 3   atorvastatin (LIPITOR) 40 MG tablet Take 40 mg by mouth daily.     azithromycin (ZITHROMAX) 500 MG tablet Take 1 tablet (500 mg total) by mouth as directed. TAKE 500 MG  1 HOUR PRIOR TO ANY DENTAL  CLEANINGS AND PROCEDURES. 4 tablet 6   bisoprolol (ZEBETA) 10 MG tablet Take 1.5 tablets (15 mg total) by mouth daily. 135 tablet 3   CALCIUM-MAGNESIUM PO Take 1 tablet by mouth daily.     cetirizine (ZYRTEC) 10 MG tablet Take 10 mg by mouth daily.     Cholecalciferol (VITAMIN D) 50 MCG (2000 UT) tablet Take 2,000 Units by mouth daily.     desvenlafaxine (PRISTIQ) 50 MG 24 hr tablet Take 1 tablet (50 mg total) by mouth daily. 90 tablet 1   ELIQUIS 5 MG TABS  tablet TAKE ONE TABLET BY MOUTH TWICE DAILY 60 tablet 0   fluticasone (FLONASE) 50 MCG/ACT nasal spray Place 2 sprays into both nostrils daily as needed for allergies or rhinitis. 48 g 3   furosemide (LASIX) 20 MG tablet TAKE ONE TABLET BY MOUTH DAILY 30 tablet 0   hydrocortisone cream 1 % Apply 1 application. topically 2 (two) times daily as needed (rash).     levETIRAcetam (KEPPRA) 750 MG tablet Take 2 tablets (1,500 mg total) by mouth 2 (two) times daily. 60 tablet 0   levothyroxine (SYNTHROID) 88 MCG tablet Take 1 tablet by mouth daily. 90 tablet 2   Multiple  Vitamins-Minerals (ALIVE WOMENS 50+ PO) Take 1 tablet by mouth daily.     Multiple Vitamins-Minerals (PRESERVISION AREDS 2 PO) Take 1 capsule by mouth in the morning and at bedtime.     Omega-3-6-9 CAPS Take 1 capsule by mouth 2 (two) times daily.     Polyethyl Glycol-Propyl Glycol (SYSTANE OP) Place 1 drop into both eyes daily as needed (dry eyes).     potassium chloride SA (KLOR-CON M) 10 MEQ tablet Take 1 tablet (10 mEq total) by mouth daily. 30 tablet 3   Probiotic Product (PROBIOTIC ADVANCED PO) Take 1 capsule by mouth daily.     Rimegepant Sulfate (NURTEC) 75 MG TBDP Dissolve 1 tablet in mouth ONCE per day as needed for migraine headache not relieved by Tylenol. 8 tablet 0   traZODone (DESYREL) 50 MG tablet Take 0.5-1 tablets (25-50 mg total) by mouth at bedtime as needed for sleep. (Patient taking differently: Take 50 mg by mouth at bedtime as needed for sleep.) 90 tablet 3   TURMERIC PO Take 1 capsule by mouth 2 (two) times daily.     umeclidinium-vilanterol (ANORO ELLIPTA) 62.5-25 MCG/ACT AEPB Inhale 1 puff into the lungs daily at 6 (six) AM. 60 each 12   No facility-administered medications prior to visit.    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 CBurt Knackand Dr. BCyndia Bent  Severe aortic stenosis    Thyroid disease    Tobacco abuse     PAST SURGICAL HISTORY: Past Surgical History:  Procedure Laterality Date   AORTIC VALVE REPLACEMENT     APPENDECTOMY     pt recently had CT that said Appendix was there so she is unsure if this was actually removed during  hysterectomy as she was previously told   BREAST ENHANCEMENT SURGERY     CARDIOVERSION N/A 10/27/2021   Procedure: CARDIOVERSION;  Surgeon: BArnoldo Lenis MD;  Location: [facility];  Service: Endoscopy;  Laterality: N/A;   INTRAOPERATIVE TRANSESOPHAGEAL ECHOCARDIOGRAM N/A 02/01/2022   Procedure: INTRAOPERATIVE TRANSESOPHAGEAL ECHOCARDIOGRAM;  Surgeon: CSherren Mocha MD;  [place] [facility]  Service: Open Heart Surgery;  Laterality: N/A;   RIGHT/LEFT HEART CATH AND CORONARY ANGIOGRAPHY N/A 11/22/2021   Procedure: RIGHT/LEFT HEART CATH AND CORONARY ANGIOGRAPHY;  Surgeon: CSherren Mocha MD;  [facility];  Service: Cardiovascular;  Laterality: N/A;   TONSILLECTOMY  VAGINAL HYSTERECTOMY      FAMILY HISTORY: Family History  Problem Relation Age of Onset   Cancer Mother        breast cancer at 56    Kidney disease Mother    Cancer Maternal Aunt        colon cancer   Heart attack Father    Diabetes Sister    Obesity Sister    Depression Sister    Alcohol abuse Brother    Cirrhosis Brother    Depression Brother    Liver disease Brother    Diabetes Daughter    Fibromyalgia Daughter    Depression Daughter    Anxiety disorder Daughter    Arthritis Daughter    Depression Sister    Obesity Sister     SOCIAL HISTORY: Social History   Socioeconomic History   Marital status: Widowed    Spouse name: Not on file   Number of children: 2   Years of education: 14   Highest education level: Associate degree: academic program  Occupational History   Occupation: Retired    Comment: Equities trader  Tobacco Use   Smoking status: Former    Packs/day: 1.00    Years: 40.00    Total pack years: 40.00    Types: Cigarettes   Smokeless tobacco: Never  Vaping Use   Vaping Use: Never used  Substance and Sexual Activity   Alcohol use: Not Currently   Drug use: No   Sexual activity: Not Currently  Other Topics Concern   Not on file  Social History Narrative   Lives  alone - one cat, one dog   Social Determinants of Health   Financial Resource Strain: Low Risk  (06/03/2021)   Overall Financial Resource Strain (CARDIA)    Difficulty of Paying Living Expenses: Not hard at all  Food Insecurity: No Food Insecurity (06/03/2021)   Hunger Vital Sign    Worried About Running Out of Food in the Last Year: Never true    [place] in the Last Year: Never true  Transportation Needs: No Transportation Needs (06/03/2021)   PRAPARE - Hydrologist (Medical): No    Lack of Transportation (Non-Medical): No  Physical Activity: Inactive (06/03/2021)   Exercise Vital Sign    Days of Exercise per Week: 0 days    Minutes of Exercise per Session: 0 min  Stress: No Stress Concern Present (06/03/2021)   [place]    Feeling of Stress : Not at all  Social Connections: Moderately Isolated (06/03/2021)   Social Connection and Isolation Panel [NHANES]    Frequency of Communication with Friends and Family: More than three times a week    Frequency of Social Gatherings with Friends and Family: More than three times a week    Attends Religious Services: Never    Marine scientist or Organizations: Yes    Attends Music therapist: More than 4 times per year    Marital Status: Widowed  Intimate Partner Violence: Not At Risk (06/03/2021)   Humiliation, Afraid, Rape, and Kick questionnaire    Fear of Current or Ex-Partner: No    Emotionally Abused: No    Physically Abused: No    Sexually Abused: No    PHYSICAL EXAM  GENERAL EXAM/CONSTITUTIONAL: Vitals:  Vitals:   03/08/22 1034  BP: 109/78  Pulse: 84  Weight: 114 lb (51.7 kg)  Height: '5\' 7"'$  (1.702 m)  Body mass index is 17.85 kg/m. Wt Readings from Last 3 Encounters:  03/08/22 114 lb (51.7 kg)  02/25/22 117 lb 9.6 oz (53.3 kg)  02/16/22 111 lb (50.3 kg)   Patient is in no distress; well developed,  nourished and groomed; neck is supple. Facial bruises noted on the bridge of nose and upper lip  EYES: Pupils round and reactive to light, Visual fields full to confrontation, Extraocular movements intacts,   MUSCULOSKELETAL: Gait, strength, tone, movements noted in Neurologic exam below  NEUROLOGIC: MENTAL STATUS:     03/21/2018    3:50 PM  MMSE - Mini Mental State Exam  Orientation to time 5  Orientation to Place 5  Registration 3  Attention/ Calculation 5  Recall 3  Language- name 2 objects 2  Language- repeat 1  Language- follow 3 step command 3  Language- read & follow direction 1  Write a sentence 1  Copy design 1  Total score 30   awake, alert, oriented to person, place and time recent and remote memory intact normal attention and concentration language fluent, comprehension intact, naming intact fund of knowledge appropriate  CRANIAL NERVE:  2nd, 3rd, 4th, 6th - pupils equal and reactive to light, visual fields full to confrontation, extraocular muscles intact, no nystagmus 5th - facial sensation symmetric 7th - facial strength symmetric 8th - hearing intact 9th - palate elevates symmetrically, uvula midline 11th - shoulder shrug symmetric 12th - tongue protrusion midline  MOTOR:  normal bulk and tone, full strength in the BUE, BLE  SENSORY:  normal and symmetric to light touch, pinprick, temperature, vibration  COORDINATION:  finger-nose-finger, fine finger movements normal  REFLEXES:  deep tendon reflexes present and symmetric  GAIT/STATION:  normal   DIAGNOSTIC DATA (LABS, IMAGING, TESTING) - I reviewed patient records, labs, notes, testing and imaging myself where available.  Lab Results  Component Value Date   WBC 9.3 02/25/2022   HGB 14.6 02/25/2022   HCT 42.8 02/25/2022   MCV 94 02/25/2022   PLT 182 02/25/2022      Component Value Date/Time   NA 139 02/25/2022 1410   K 4.5 02/25/2022 1410   CL 97 02/25/2022 1410   CO2 27  02/25/2022 1410   GLUCOSE 100 (H) 02/25/2022 1410   GLUCOSE 113 (H) 02/11/2022 0108   BUN 12 02/25/2022 1410   CREATININE 0.70 02/25/2022 1410   CALCIUM 10.3 02/25/2022 1410   PROT 7.3 02/25/2022 1410   ALBUMIN 4.1 02/25/2022 1410   AST 70 (H) 02/25/2022 1410   ALT 67 (H) 02/25/2022 1410   ALKPHOS 160 (H) 02/25/2022 1410   BILITOT 0.3 02/25/2022 1410   GFRNONAA >60 02/11/2022 0108   GFRAA 111 03/06/2020 1057   Lab Results  Component Value Date   CHOL 133 02/05/2022   HDL 36 (L) 02/05/2022   LDLCALC 79 02/05/2022   TRIG 89 02/05/2022   CHOLHDL 3.7 02/05/2022   Lab Results  Component Value Date   HGBA1C 5.6 02/04/2022   Lab Results  Component Value Date   VITAMINB12 837 02/05/2022   Lab Results  Component Value Date   TSH 0.399 02/05/2022    MRI Brain 02/07/22 1. Significantly motion limited study. Punctate foci of apparent restricted diffusion in the left fronta and right occipital lobes and right cerebellum could represent punctate acute infarcts or artifact. 2. Moderate chronic microvascular ischemic disease.   LTM EEG 02/08/22 - Seizure without clinical sign, left posterior quadrant - Spike, left posterior quadrant - Intermittent slow, generalized  and lateralized left hemisphere   ASSESSMENT AND PLAN  71 y.o. year old female with medical history including atrial fibrillation, hypertension, hyperlipidemia, COPD, recent multiple embolic strokes and seizure disorder who is presenting to establish care.  Patient developed multiple embolic strokes after TAVR, from the strokes she had symptomatic seizures.  Currently she is on Keppra 1500 mg twice daily, denies any additional seizure but reports side effects of mood swing, memory problem and hallucination.  At the moment I will obtain a Keppra level, and based on the result we will consider decreasing the Keppra to 750 twice daily or 750 in the morning and 1500 in the evening.  She also had a fall landed face forward 2  weeks ago, likely suffered a concussion.  I have informed patient and sister that some of the symptoms might be worsened by the recent concussion.  At the moment continue current medications, I will contact you once I get the Keppra level and send a new prescription.  Most likely I will keep on Keppra for a total of a year prior to discontinuation. She is comfortable with plan continue to follow with your PCP and return in 6 months or sooner if worse.   1. Partial symptomatic epilepsy with complex partial seizures, not intractable, without status epilepticus ([place])   2. Fall, subsequent encounter   3. Therapeutic drug monitoring      Patient Instructions  Will obtain Keppra level today, will likely decrease to 75 mg BID or 750 mg AM and 1500 mg PM based on results.  Will send a new prescription at that time Continue to follow up with PCP  Return in 6 months    Orders Placed This Encounter  Procedures   Levetiracetam level    No orders of the defined types were placed in this encounter.   Return in about 6 months (around 09/08/2022).    Alric Ran, MD 03/08/2022, 4:40 PM  [facility] [address], [place], [place] [address] [phone]

## 2022-03-08 NOTE — Telephone Encounter (Signed)
-----   Message from Arnoldo Lenis, MD sent at 03/06/2022 11:36 AM EDT ----- Monitor shows she was in afib throughout the study, heart rates on average conrolled though occasional high rates. Will see how she is doing at our f/u next month to see if any changes needed  Zandra Abts MD

## 2022-03-08 NOTE — Patient Instructions (Addendum)
Will obtain Keppra level today, will likely decrease to 75 mg BID or 750 mg AM and 1500 mg PM based on results.  Will send a new prescription at that time Continue to follow up with PCP  Return in 6 months

## 2022-03-09 ENCOUNTER — Encounter: Payer: Medicare HMO | Admitting: Physical Therapy

## 2022-03-09 LAB — LEVETIRACETAM LEVEL: Levetiracetam Lvl: 29.8 ug/mL (ref 10.0–40.0)

## 2022-03-10 ENCOUNTER — Telehealth: Payer: Self-pay

## 2022-03-10 MED ORDER — LEVETIRACETAM 750 MG PO TABS: 750.0000 mg | ORAL_TABLET | Freq: Two times a day (BID) | ORAL | 3 refills | Status: DC

## 2022-03-10 NOTE — Telephone Encounter (Signed)
-----   Message from Alric Ran, MD sent at 03/10/2022  7:42 AM EDT ----- Please call and inform patient that the recent [place] level we checked was within normal limits but since you are having side effects of somnolence, mood swing and hallucination, I will decrease it to 750 mg twice daily. I will send the new prescription. Please keep any upcoming appointments or tests and call us with any interim questions, concerns, problems or updates. Thanks,   Alric Ran, MD

## 2022-03-10 NOTE — Progress Notes (Signed)
Please call and inform patient that the recent [place] level we checked was within normal limits but since you are having side effects of somnolence, mood swing and hallucination, I will decrease it to 750 mg twice daily. I will send the new prescription. Please keep any upcoming appointments or tests and call us with any interim questions, concerns, problems or updates. Thanks,   Alric Ran, MD

## 2022-03-10 NOTE — Addendum Note (Signed)
Addended byAlric Ran on: 03/10/2022 07:43 AM   Modules accepted: Orders

## 2022-03-10 NOTE — Telephone Encounter (Signed)
I called the pt and we discussed the results. She verbalized understanding and appreciation for the call.   She will pick up new rx and take as directed .

## 2022-03-14 ENCOUNTER — Other Ambulatory Visit: Payer: Self-pay

## 2022-03-14 ENCOUNTER — Other Ambulatory Visit: Payer: Medicare HMO

## 2022-03-14 DIAGNOSIS — R748 Abnormal levels of other serum enzymes: Secondary | ICD-10-CM

## 2022-03-14 DIAGNOSIS — H524 Presbyopia: Secondary | ICD-10-CM | POA: Diagnosis not present

## 2022-03-15 LAB — HEPATIC FUNCTION PANEL
ALT: 27 IU/L (ref 0–32)
AST: 26 IU/L (ref 0–40)
Albumin: 3.8 g/dL (ref 3.8–4.8)
Alkaline Phosphatase: 149 IU/L — ABNORMAL HIGH (ref 44–121)
Bilirubin Total: 0.3 mg/dL (ref 0.0–1.2)
Bilirubin, Direct: 0.13 mg/dL (ref 0.00–0.40)
Total Protein: 6.9 g/dL (ref 6.0–8.5)

## 2022-03-16 ENCOUNTER — Other Ambulatory Visit (HOSPITAL_COMMUNITY): Payer: Medicare HMO

## 2022-03-16 ENCOUNTER — Ambulatory Visit: Payer: Medicare HMO | Attending: Internal Medicine

## 2022-03-16 DIAGNOSIS — R2681 Unsteadiness on feet: Secondary | ICD-10-CM | POA: Diagnosis not present

## 2022-03-16 DIAGNOSIS — M6281 Muscle weakness (generalized): Secondary | ICD-10-CM | POA: Insufficient documentation

## 2022-03-16 NOTE — Therapy (Signed)
OUTPATIENT PHYSICAL THERAPY TREATMENT NOTE   Patient Name: Tammie Martin MRN: [id number] DOB:30-May-1951, 71 y.o., female Today's Date: 03/16/2022  PCP: Janora Norlander, DO REFERRING PROVIDER: Sloan Leiter, MD    PT End of Session - 03/16/22 1305     Visit Number 2    Number of Visits 3    Date for PT Re-Evaluation 03/25/22    PT Start Time 30    PT Stop Time 1345    PT Time Calculation (min) 45 min    Activity Tolerance Patient tolerated treatment well    Behavior During Therapy [place] for tasks assessed/performed             Past Medical History:  Diagnosis Date   Anxiety    Atrial fibrillation ([place])    Cancer ([place]) 1979   cervical cancer   COPD (chronic obstructive pulmonary disease) ([place])    Depression    DJD (degenerative joint disease)    History of cervical cancer    Hyperlipidemia    Hypertension    Hypothyroidism    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 CBurt Knackand Dr. BCyndia Bent  Severe aortic stenosis    Thyroid disease    Tobacco abuse    Past Surgical History:  Procedure Laterality Date   AORTIC VALVE REPLACEMENT     APPENDECTOMY     pt recently had CT that said Appendix was there so she is unsure if this was actually removed during hysterectomy as she was previously told   BREAST ENHANCEMENT SURGERY     CARDIOVERSION N/A 10/27/2021   Procedure: CARDIOVERSION;  Surgeon: BArnoldo Lenis MD;  Location: [facility];  Service: Endoscopy;  Laterality: N/A;   INTRAOPERATIVE TRANSESOPHAGEAL ECHOCARDIOGRAM N/A 02/01/2022   Procedure: INTRAOPERATIVE TRANSESOPHAGEAL ECHOCARDIOGRAM;  Surgeon: CSherren Mocha MD;  [facility]  Service: Open Heart Surgery;  Laterality: N/A;   RIGHT/LEFT HEART CATH AND CORONARY ANGIOGRAPHY N/A 11/22/2021   Procedure: RIGHT/LEFT HEART CATH AND CORONARY ANGIOGRAPHY;  Surgeon: CSherren Mocha MD;  [facility];   Service: Cardiovascular;  Laterality: N/A;   TONSILLECTOMY     VAGINAL HYSTERECTOMY     Patient Active Problem List   Diagnosis Date Noted   Acute metabolic encephalopathy 094/[id number]  Acute respiratory failure with hypoxia ([facility] 02/07/2022   Depression 02/07/2022   A-fib ([facility] 02/07/2022   Seizure ([facility] 02/07/2022   Encephalopathy    CVA (cerebral vascular accident) ([facility] 02/04/2022   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   History of cervical cancer 02/01/2022   Severe aortic stenosis 01/17/2022   Tobacco abuse 01/17/2022   Osteopenia 03/11/2020   Olecranon bursitis of left elbow 11/26/2017   Essential hypertension, benign 11/20/2015   Hyperlipidemia LDL goal <130 11/20/2015   Hypothyroidism 11/20/2015   Generalized anxiety disorder 11/20/2015    REFERRING DIAG: Unsteadiness on feet   THERAPY DIAG:  Unsteadiness on feet  Muscle weakness (generalized)  Rationale for Evaluation and Treatment Rehabilitation  PERTINENT HISTORY: a-fib  PRECAUTIONS: None   SUBJECTIVE: Pt reports that she had a fall hurting her left knee and that has why she has cancelled her last few treatments.   PAIN:  Are you having pain? No   TODAY'S TREATMENT:                          7/19  EXERCISE LOG  Exercise Repetitions and Resistance Comments  Nustep  Lvl 3 x 15 mins   Heel/Toe Raises 20 reps   Forward Step Up 6 inch step, BLE, BUE support x 20 reps   Rockerboard 4 mins   LAQ BLE, 3# x 20 reps   Seated Marching BLE, 3# x 20 reps   Ham Curls    Hip Abduction  Red tband x 2 mins   Ball Squeezes  2 mins        Blank cell = exercise not performed today    PATIENT EDUCATION: Education details: HEP, safety, decreased fall risk Person educated: Patient Education method: Explanation, Tactile cues, and Verbal cues Education comprehension: verbalized understanding, returned demonstration, verbal cues required, tactile cues required, and needs further  education    PT Long Term Goals - 03/16/22 1306       PT LONG TERM GOAL #1   Title Patient will be independent with her HEP.    Time 3    Period Weeks    Status On-going    Target Date 03/14/22      PT LONG TERM GOAL #2   Title Patient will improve her five time sit to stand to 12 seconds or less to reduce her fall risk.    Time 3    Period Weeks    Status On-going    Target Date 03/14/22      PT LONG TERM GOAL #3   Title Patient will be able to ambulate without significant gait deviations for improved mobility.    Time 3    Period Weeks    Status On-going    Target Date 03/14/22              Plan - 03/16/22 1306     Clinical Impression Statement Pt arrives for today's treatment session denying any pain.  Pt reports that she fell and injured her left knee and that is why she cancelled her past few treatments.  Pt instructed in standing and seated BLE exercises to increase strength and function.  Pt requiring min cues for proper technique with all newly added exercises.   Pt encouraged to continue HEP as previously instructed.  Pt denied any pain at completion of today's treamtent session.    Personal Factors and Comorbidities Transportation;Finances;Comorbidity 3+    Comorbidities HTN, history of CVA, a-fib, osteopenia, history of cancer, seizures    Examination-Activity Limitations Locomotion Level    Examination-Participation Restrictions Cleaning    Stability/Clinical Decision Making Stable/Uncomplicated    Rehab Potential Good    PT Frequency 1x / week   due to financial constraints, per patient request   PT Duration 3 weeks    PT Treatment/Interventions ADLs/Self Care Home Management;Gait training;Stair training;Functional mobility training;Therapeutic activities;Therapeutic exercise;Balance training;Neuromuscular re-education;Patient/family education    PT Next Visit Plan nustep, lower extremity strengthening, and update HEP (green t-band provided at initial  evaluation.    PT Home Exercise Plan Access Code: [email or web address]  URL: [email or web address]  Date: 02/21/2022  Prepared by: Jacqulynn Cadet    Exercises  - Seated Hip Abduction with Resistance  - 2 x daily - 7 x weekly - 2 sets - 10 reps  - Seated March with Resistance  - 2 x daily - 7 x weekly - 2 sets - 10 reps  - Sitting Knee Extension with Resistance  - 2 x daily - 7 x weekly - 2 sets - 10 reps    Consulted and Agree with Plan of Care Patient  Kathrynn Ducking, PTA 03/16/2022, 1:58 PM

## 2022-03-17 ENCOUNTER — Other Ambulatory Visit: Payer: Self-pay | Admitting: Family Medicine

## 2022-03-17 DIAGNOSIS — F329 Major depressive disorder, single episode, unspecified: Secondary | ICD-10-CM

## 2022-03-17 DIAGNOSIS — F411 Generalized anxiety disorder: Secondary | ICD-10-CM

## 2022-03-17 DIAGNOSIS — F5101 Primary insomnia: Secondary | ICD-10-CM

## 2022-03-17 NOTE — Telephone Encounter (Signed)
Last office visit 02/25/22 Last refills:  Pristiq, 09/08/21, #90, 1 refill Trazodone 09/08/21, #90, 1 refill

## 2022-03-18 ENCOUNTER — Other Ambulatory Visit (HOSPITAL_COMMUNITY): Payer: Medicare HMO

## 2022-03-18 DIAGNOSIS — Z0100 Encounter for examination of eyes and vision without abnormal findings: Secondary | ICD-10-CM | POA: Diagnosis not present

## 2022-03-18 NOTE — Progress Notes (Unsigned)
[facility] [place]                                     Cardiology Office Note:    Date:  03/23/2022   ID:  Tammie Martin, DOB Dec 24, 1950, MRN [id number]  PCP:  Janora Norlander, DO  [facility] Cardiologist:  Carlyle Dolly, MD  [facility] Electrophysiologist:  None   Referring MD: Janora Norlander, DO   Chief Complaint  Patient presents with   Follow-up    1 month s/p TAVR   History of Present Illness:    Tammie Martin is a 72 y.o. female with a hx of persistent atrial fibrillation, aortic stenosis s/p recent TAVR, COPD, history of mild to moderate coronary artery disease, hypertension, and hyperlipidemia who underwent TAVR 02/01/22 and was unfortunately re-admitted two days after discharge with CVA and AF with RVR.    Tammie Martin is followed by Dr. [name]. She was first diagnosed with a heart murmur in 2017 and was found to have mild AS at that time. Patient was diagnosed with atrial fibrillation in 09/2021, and underwent echocardiogram on 10/20/21 that showed EF 60-65%, mild LVH, low normal RV systolic function, severely elevated pulmonary artery systolic pressure, and moderate to severe low flow low gradient aortic stenosis. Patient underwent elective cardioversion for her atrial fibrillation, remained in SR for about a week, and then had recurrence of afib. Her atrial fibrillation will be managed medically with a rate control/anticoagulation strategy.    She was referred to Dr. Burt Knack for evaluation of aortic stenosis on 11/30/21. At that appointment, she reported she was having progressive symptoms including limitation with exertional dyspnea, occasional chest burning. Due to these symptoms and her echocardiogram findings, patient was scheduled for TAVR. Underwent TAVR with S3UR 42m on 02/01/22. She tolerated the procedure well and was discharged 02/02/22.    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    She was once again admitted 02/07/22 with AMS and seizure activity. Neurology was consulted and followed with her during her hospitalization. She was started on Keppra.    In follow up with myself, she was doing well however was having trouble with memory and cognition.  Today she states that she continues to do well. Her SOB is much better. She suffered a fall after she was seen last due to instability. She is now walking with a cane or walker if she is outside her home. She is healing well. She has not worn her ZIO monitor yet. She thought she was supposed to wait until after her repeat echocardiogram. She denies palpitations. No chest pain, LE edema, orthopnea, dizziness, or syncope.    Past Medical History:  Diagnosis Date   Anxiety    [place]rial fibrillation ([place]    Cancer ([facility] 1979   cervical cancer   COPD (chronic obstructive pulmonary disease) ([facility])    Depression    DJD (degenerative joint disease)    History of cervical cancer    Hyperlipidemia    Hypertension    Hypothyroidism    S/P TAVR (transcatheter aortic  valve replacement) 02/01/2022   s/p TAVR wtih a 7m Edwards S3UR via the right subclavian approach by Dr. CBurt Knackand Dr. BCyndia Bent  Severe aortic stenosis    Thyroid disease    Tobacco abuse     Past Surgical History:  Procedure Laterality Date   AORTIC VALVE REPLACEMENT     APPENDECTOMY     pt recently had CT that said Appendix was there so she is unsure if this was actually removed during hysterectomy as she was previously told   BREAST ENHANCEMENT SURGERY     CARDIOVERSION N/A 10/27/2021   Procedure: CARDIOVERSION;  Surgeon: BArnoldo Lenis MD;  Location: [facility];  Service: Endoscopy;  Laterality: N/A;    INTRAOPERATIVE TRANSESOPHAGEAL ECHOCARDIOGRAM N/A 02/01/2022   Procedure: INTRAOPERATIVE TRANSESOPHAGEAL ECHOCARDIOGRAM;  Surgeon: CSherren Mocha MD;  [facility]  Service: Open Heart Surgery;  Laterality: N/A;   RIGHT/LEFT HEART CATH AND CORONARY ANGIOGRAPHY N/A 11/22/2021   Procedure: RIGHT/LEFT HEART CATH AND CORONARY ANGIOGRAPHY;  Surgeon: CSherren Mocha MD;  [facility];  Service: Cardiovascular;  Laterality: N/A;   TONSILLECTOMY     VAGINAL HYSTERECTOMY      Current Medications: Current Meds  Medication Sig   albuterol (VENTOLIN HFA) 108 (90 Base) MCG/ACT inhaler Inhale 2 puffs into the lungs every 6 (six) hours as needed for wheezing or shortness of breath.   atorvastatin (LIPITOR) 20 MG tablet Take 2 tablets (40 mg total) by mouth daily.   atorvastatin (LIPITOR) 40 MG tablet Take 40 mg by mouth daily.   azithromycin (ZITHROMAX) 500 MG tablet Take 1 tablet (500 mg total) by mouth as directed. TAKE 500 MG  1 HOUR PRIOR TO ANY DENTAL  CLEANINGS AND PROCEDURES.   bisoprolol (ZEBETA) 10 MG tablet Take 1.5 tablets (15 mg total) by mouth daily.   CALCIUM-MAGNESIUM PO Take 1 tablet by mouth daily.   cetirizine (ZYRTEC) 10 MG tablet Take 10 mg by mouth daily.   Cholecalciferol (VITAMIN D) 50 MCG (2000 UT) tablet Take 2,000 Units by mouth daily.   desvenlafaxine (PRISTIQ) 50 MG 24 hr tablet TAKE ONE TABLET BY MOUTH DAILY   ELIQUIS 5 MG TABS tablet TAKE ONE TABLET BY MOUTH TWICE DAILY   fluticasone (FLONASE) 50 MCG/ACT nasal spray Place 2 sprays into both nostrils daily as needed for allergies or rhinitis.   furosemide (LASIX) 20 MG tablet TAKE ONE TABLET BY MOUTH DAILY   hydrocortisone cream 1 % Apply 1 application. topically 2 (two) times daily as needed (rash).   levETIRAcetam (KEPPRA) 750 MG tablet Take 1 tablet (750 mg total) by mouth 2 (two) times daily.   levothyroxine (SYNTHROID) 88 MCG tablet Take 1 tablet by mouth daily.   Polyethyl Glycol-Propyl Glycol  (SYSTANE OP) Place 1 drop into both eyes daily as needed (dry eyes).   potassium chloride SA (KLOR-CON M) 10 MEQ tablet Take 1 tablet (10 mEq total) by mouth daily.   Probiotic Product (PROBIOTIC ADVANCED PO) Take 1 capsule by mouth daily.   traZODone (DESYREL) 50 MG tablet Take 1 tablet (50 mg total) by mouth at bedtime as needed for sleep.   TURMERIC PO Take 1 capsule by mouth 2 (two) times daily.   umeclidinium-vilanterol (ANORO ELLIPTA) 62.5-25 MCG/ACT AEPB Inhale 1 puff into the lungs daily at 6 (six) AM.     Allergies:   Penicillins, Singulair [montelukast sodium], Codeine, Nicoderm [nicotine], and Other   Social History   Socioeconomic History   Marital status: Widowed    Spouse  name: Not on file   Number of children: 2   Years of education: 14   Highest education level: Associate degree: academic program  Occupational History   Occupation: Retired    Comment: Equities trader  Tobacco Use   Smoking status: Former    Packs/day: 1.00    Years: 40.00    Total pack years: 40.00    Types: Cigarettes   Smokeless tobacco: Never  Vaping Use   Vaping Use: Never used  Substance and Sexual Activity   Alcohol use: Not Currently   Drug use: No   Sexual activity: Not Currently  Other Topics Concern   Not on file  Social History Narrative   Lives alone - one cat, one dog   Social Determinants of Health   Financial Resource Strain: Low Risk  (06/03/2021)   Overall Financial Resource Strain (CARDIA)    Difficulty of Paying Living Expenses: Not hard at all  Food Insecurity: No Food Insecurity (06/03/2021)   Hunger Vital Sign    Worried About Running Out of Food in the Last Year: Never true    [place] in the Last Year: Never true  Transportation Needs: No Transportation Needs (06/03/2021)   PRAPARE - Hydrologist (Medical): No    Lack of Transportation (Non-Medical): No  Physical Activity: Inactive (06/03/2021)   Exercise Vital Sign    Days  of Exercise per Week: 0 days    Minutes of Exercise per Session: 0 min  Stress: No Stress Concern Present (06/03/2021)   [place]    Feeling of Stress : Not at all  Social Connections: Moderately Isolated (06/03/2021)   Social Connection and Isolation Panel [NHANES]    Frequency of Communication with Friends and Family: More than three times a week    Frequency of Social Gatherings with Friends and Family: More than three times a week    Attends Religious Services: Never    Marine scientist or Organizations: Yes    Attends Music therapist: More than 4 times per year    Marital Status: Widowed     Family History: The patient's family history includes Alcohol abuse in her brother; Anxiety disorder in her daughter; Arthritis in her daughter; Cancer in her maternal aunt and mother; Cirrhosis in her brother; Depression in her brother, daughter, sister, and sister; Diabetes in her daughter and sister; Fibromyalgia in her daughter; Heart attack in her father; Kidney disease in her mother; Liver disease in her brother; Obesity in her sister and sister.  ROS:   Please see the history of present illness.    All other systems reviewed and are negative.  EKGs/Labs/Other Studies Reviewed:    The following studies were reviewed today:  Echocardiogram 03/21/22:   1. Left ventricular ejection fraction, by estimation, is 60 to 65%. The  left ventricle has normal function. The left ventricle has no regional  wall motion abnormalities. Left ventricular diastolic function could not  be evaluated.   2. Right ventricular systolic function is normal. The right ventricular  size is mildly enlarged. There is mildly elevated pulmonary artery  systolic pressure.   3. Left atrial size was severely dilated.   4. Right atrial size was severely dilated.   5. Degenerative mitral valve, at risk for mitral stenosis. Mild mitral   valve regurgitation. Moderate to severe mitral annular calcification.   6. Tricuspid valve regurgitation is moderate to severe.  7. The aortic valve has been repaired/replaced. Aortic valve  regurgitation is trivial. No aortic stenosis is present. There is a 23 mm  Sapien prosthetic (TAVR) valve present in the aortic position. Aortic  regurgitation PHT measures 609 msec. Aortic  valve Vmax measures 1.56 m/s.   8. The inferior vena cava is normal in size with greater than 50%  respiratory variability, suggesting right atrial pressure of 3 mmHg.     TAVR OPERATIVE NOTE     Date of Procedure:                02/01/2022   Preoperative Diagnosis:      Severe Aortic Stenosis    Postoperative Diagnosis:    Same    Procedure:        Transcatheter Aortic Valve Replacement - Percutaneous  Transfemoral Approach             Edwards Sapien 3 Ultra ResiliaTHV (size 23 mm, serial # [id number])              Co-Surgeons:                        Gaye Pollack, MD and Sherren Mocha, MD   Anesthesiologist:                  Hoy Morn, MD   Echocardiographer:              Marry Guan, MD   Pre-operative Echo Findings: Severe aortic stenosis Normal left ventricular systolic function   Post-operative Echo Findings: Trivial paravalvular leak Normal left ventricular systolic function     Echocardiogram 02/02/2022:   1. 23 mm S3. Mild paravalvular leak. Vmax 1.6 m/s, MG 5.0 mmHG, EOA 2.0  cm2, DI 0.55. Prosthesis within normal limits. The aortic valve has been  repaired/replaced. Aortic valve regurgitation is mild. There is a 23 mm  Sapien prosthetic (TAVR) valve  present in the aortic position. Procedure Date: 01/01/2022.   2. Left ventricular ejection fraction, by estimation, is 60 to 65%. The  left ventricle has normal function. The left ventricle has no regional  wall motion abnormalities. Left ventricular diastolic function could not  be evaluated.   3. Right ventricular systolic function  is normal. The right ventricular  size is mildly enlarged. There is severely elevated pulmonary artery  systolic pressure.   4. Left atrial size was severely dilated.   5. Right atrial size was severely dilated.   6. The mitral valve is degenerative. Trivial mitral valve regurgitation.  Mild mitral stenosis. The mean mitral valve gradient is 4.0 mmHg with  average heart rate of 80 bpm. Moderate to severe mitral annular  calcification.   7. The inferior vena cava is dilated in size with <50% respiratory  variability, suggesting right atrial pressure of 15 mmHg.   EKG:  EKG is not ordered today.    Recent Labs: 09/16/2021: BNP 338.5 02/05/2022: TSH 0.399 02/10/2022: Magnesium 2.0 02/25/2022: BUN 12; Creatinine, Ser 0.70; Hemoglobin 14.6; Platelets 182; Potassium 4.5; Sodium 139 03/14/2022: ALT 27  Recent Lipid Panel    Component Value Date/Time   CHOL 133 02/05/2022 0941   CHOL 144 04/15/2021 1328   TRIG 89 02/05/2022 0941   HDL 36 (L) 02/05/2022 0941   HDL 41 04/15/2021 1328   CHOLHDL 3.7 02/05/2022 0941   VLDL 18 02/05/2022 0941   LDLCALC 79 02/05/2022 0941   LDLCALC 84 04/15/2021 1328   Physical Exam:    VS:  BP 117/89   Pulse 83   Ht '5\' 7"'$  (1.702 m)   Wt 116 lb (52.6 kg)   SpO2 95%   BMI 18.17 kg/m     Wt Readings from Last 3 Encounters:  03/21/22 116 lb (52.6 kg)  03/08/22 114 lb (51.7 kg)  02/25/22 117 lb 9.6 oz (53.3 kg)    General: Well developed, well nourished, NAD Lungs:Clear to ausculation bilaterally. No wheezes, rales, or rhonchi. Breathing is unlabored. Cardiovascular: Irregularly irregular. No murmurs, rubs, gallops, or LV heave appreciated. Extremities: No edema.  Neuro: Alert and oriented. No focal deficits. No facial asymmetry. MAE spontaneously. Psych: Responds to questions appropriately with normal affect.    ASSESSMENT/PLAN:    Severe AS: s/p successful TAVR with a 23 mm Edwards Sapien 3 UR via the TF  approach on 02/01/22. Post operative echo  with normal prothesis, mean gradient at 30mHg and mild paravalvular leak. Patient doing well with NYHA Class I symptoms. Continue Eliquis given persistent AF. SBE discussed. Has RX Azithromycin given PCN allergy. Plan one month follow up with echocardiogram.    Palpitations with known AF: Previously had complaints of intermittent palpations and ZIO monitor ordered. She has not yet worn the monitor and reports that her palpitations have improved slightly. Will follow ZIO results. \   Hx of acute CVA:  Felt to be secondary to left atrial appendage thrombus seen on intraoperative TEE on 6/6.  Suspicion that she may have developed this thrombus when she was off Eliquis pre-TAVR. Continue Eliquis. No new neuro changes with improvement in her memory and stability.  Continue to follow with neurology    Seizure disorder: In the setting of recent CVA. Seen by neurology and placed on Keppra with no recurrent episodes after discharge.    HTN: Stable with no changes needed at this time.    COPD with ongoing tobacco use: Continue bronchodilators. Reports improvement in SOB post TAVR.   Incidental finding: Pre TAVR CT imaging showed an enlarged right lower paratracheal lymph node measuring up to 1.7cm in short axis, potentially reactive, although size is somewhat greater than expected. Recommend follow-up chest CT in 3 months>will order this today.     Medication Adjustments/Labs and Tests Ordered: Current medicines are reviewed at length with the patient today.  Concerns regarding medicines are outlined above.  Orders Placed This Encounter  Procedures   CT CHEST WO CONTRAST   No orders of the defined types were placed in this encounter.   Patient Instructions  Medication Instructions:  Your physician recommends that you continue on your current medications as directed. Please refer to the Current Medication list given to you today.  *If you need a refill on your cardiac medications before your next  appointment, please call your pharmacy*   Lab Work: NONE If you have labs (blood work) drawn today and your tests are completely normal, you will receive your results only by: [facility](if you have MyChart) OR A paper copy in the mail If you have any lab test that is abnormal or we need to change your treatment, we will call you to review the results.   Testing/Procedures: NONE   Follow-Up: [facility] you and your health needs are our priority.  As part of our continuing mission to provide you with exceptional heart care, we have created designated Provider Care Teams.  These Care Teams include your primary Cardiologist (physician) and Advanced Practice Providers (APPs -  Physician Assistants and Nurse Practitioners) who all work  together to provide you with the care you need, when you need it.  We recommend signing up for the patient portal called "MyChart".  Sign up information is provided on this After Visit Summary.  MyChart is used to connect with patients for Virtual Visits (Telemedicine).  Patients are able to view lab/test results, encounter notes, upcoming appointments, etc.  Non-urgent messages can be sent to your provider as well.   To learn more about what you can do with MyChart, go to [email or web address].    Your next appointment:   SOMEONE WILL CALL YOU TO SCHEDULE A FOLLOW-UP APPOINTMENT   Important Information About Sugar         Signed, Kathyrn Drown, NP  03/23/2022 11:51 AM    [place]

## 2022-03-21 ENCOUNTER — Ambulatory Visit (HOSPITAL_COMMUNITY): Payer: Medicare HMO | Attending: Cardiology

## 2022-03-21 ENCOUNTER — Ambulatory Visit: Payer: Medicare HMO | Admitting: Cardiology

## 2022-03-21 VITALS — BP 117/89 | HR 83 | Ht 67.0 in | Wt 116.0 lb

## 2022-03-21 DIAGNOSIS — I350 Nonrheumatic aortic (valve) stenosis: Secondary | ICD-10-CM

## 2022-03-21 DIAGNOSIS — I10 Essential (primary) hypertension: Secondary | ICD-10-CM | POA: Diagnosis not present

## 2022-03-21 DIAGNOSIS — Z952 Presence of prosthetic heart valve: Secondary | ICD-10-CM | POA: Diagnosis not present

## 2022-03-21 DIAGNOSIS — R591 Generalized enlarged lymph nodes: Secondary | ICD-10-CM

## 2022-03-21 DIAGNOSIS — J449 Chronic obstructive pulmonary disease, unspecified: Secondary | ICD-10-CM

## 2022-03-21 DIAGNOSIS — I4819 Other persistent atrial fibrillation: Secondary | ICD-10-CM | POA: Diagnosis not present

## 2022-03-21 LAB — ECHOCARDIOGRAM COMPLETE
AV Mean grad: 5.6 mmHg
AV Peak grad: 9.7 mmHg
Ao pk vel: 1.56 m/s
P 1/2 time: 609 msec
S' Lateral: 2.7 cm

## 2022-03-21 NOTE — Patient Instructions (Signed)
Medication Instructions:  Your physician recommends that you continue on your current medications as directed. Please refer to the Current Medication list given to you today.  *If you need a refill on your cardiac medications before your next appointment, please call your pharmacy*   Lab Work: NONE If you have labs (blood work) drawn today and your tests are completely normal, you will receive your results only by: [place] (if you have MyChart) OR A paper copy in the mail If you have any lab test that is abnormal or we need to change your treatment, we will call you to review the results.   Testing/Procedures: NONE   Follow-Up: At [facility], you and your health needs are our priority.  As part of our continuing mission to provide you with exceptional heart care, we have created designated Provider Care Teams.  These Care Teams include your primary Cardiologist (physician) and Advanced Practice Providers (APPs -  Physician Assistants and Nurse Practitioners) who all work together to provide you with the care you need, when you need it.  We recommend signing up for the patient portal called "MyChart".  Sign up information is provided on this After Visit Summary.  MyChart is used to connect with patients for Virtual Visits (Telemedicine).  Patients are able to view lab/test results, encounter notes, upcoming appointments, etc.  Non-urgent messages can be sent to your provider as well.   To learn more about what you can do with MyChart, go to [email or web address].    Your next appointment:   SOMEONE WILL CALL YOU TO SCHEDULE A FOLLOW-UP APPOINTMENT   Important Information About Sugar

## 2022-03-23 ENCOUNTER — Ambulatory Visit: Payer: Medicare HMO

## 2022-03-23 DIAGNOSIS — M6281 Muscle weakness (generalized): Secondary | ICD-10-CM | POA: Diagnosis not present

## 2022-03-23 DIAGNOSIS — R2681 Unsteadiness on feet: Secondary | ICD-10-CM | POA: Diagnosis not present

## 2022-03-23 NOTE — Therapy (Addendum)
OUTPATIENT PHYSICAL THERAPY TREATMENT NOTE   Patient Name: Tammie Martin MRN: [id number] DOB:24-Jan-1951, 71 y.o., female Today's Date: 03/23/2022  PCP: Janora Norlander, DO REFERRING PROVIDER: Sloan Leiter, MD    PT End of Session - 03/23/22 1310     Visit Number 3    Number of Visits 3    Date for PT Re-Evaluation 03/25/22    PT Start Time 51    PT Stop Time 1347    PT Time Calculation (min) 47 min    Activity Tolerance Patient tolerated treatment well    Behavior During Therapy [facility] for tasks assessed/performed             Past Medical History:  Diagnosis Date   Anxiety    Atrial fibrillation ([place])    Cancer ([place]) 1979   cervical cancer   COPD (chronic obstructive pulmonary disease) (HCC)    Depression    DJD (degenerative joint disease)    History of cervical cancer    Hyperlipidemia    Hypertension    Hypothyroidism    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 CBurt Knackand Dr. BCyndia Bent  Severe aortic stenosis    Thyroid disease    Tobacco abuse    Past Surgical History:  Procedure Laterality Date   AORTIC VALVE REPLACEMENT     APPENDECTOMY     pt recently had CT that said Appendix was there so she is unsure if this was actually removed during hysterectomy as she was previously told   BREAST ENHANCEMENT SURGERY     CARDIOVERSION N/A 10/27/2021   Procedure: CARDIOVERSION;  Surgeon: BArnoldo Lenis MD;  Location: [facility];  Service: Endoscopy;  Laterality: N/A;   INTRAOPERATIVE TRANSESOPHAGEAL ECHOCARDIOGRAM N/A 02/01/2022   Procedure: INTRAOPERATIVE TRANSESOPHAGEAL ECHOCARDIOGRAM;  Surgeon: CSherren Mocha MD;  Location: [facility]  Service: Open Heart Surgery;  Laterality: N/A;   RIGHT/LEFT HEART CATH AND CORONARY ANGIOGRAPHY N/A 11/22/2021   Procedure: RIGHT/LEFT HEART CATH AND CORONARY ANGIOGRAPHY;  Surgeon: CSherren Mocha MD;  [facility];   Service: Cardiovascular;  Laterality: N/A;   TONSILLECTOMY     VAGINAL HYSTERECTOMY     Patient Active Problem List   Diagnosis Date Noted   Acute metabolic encephalopathy 018/56/3149  Acute respiratory failure with hypoxia ([place] 02/07/2022   Depression 02/07/2022   A-fib ([place] 02/07/2022   [place]ure [place] 02/07/2022   Encephalopathy    CVA (cerebral vascular accident) ([place] 02/04/2022   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   History of cervical cancer 02/01/2022   Severe aortic stenosis 01/17/2022   Tobacco abuse 01/17/2022   Osteopenia 03/11/2020   Olecranon bursitis of left elbow 11/26/2017   Essential hypertension, benign 11/20/2015   Hyperlipidemia LDL goal <130 11/20/2015   Hypothyroidism 11/20/2015   Generalized anxiety disorder 11/20/2015    REFERRING DIAG: Unsteadiness on feet   THERAPY DIAG:  Unsteadiness on feet  Muscle weakness (generalized)  Rationale for Evaluation and Treatment Rehabilitation  PERTINENT HISTORY: a-fib  PRECAUTIONS: None   SUBJECTIVE: Pt arrives for today's treatment stating that she feels really good.  Pt reports mild left knee pain.   PAIN:  Are you having pain? Yes: NPRS scale: 1/10 Pain location: left knee   TODAY'S TREATMENT:                          7/26  EXERCISE LOG  Exercise Repetitions and Resistance Comments  Nustep  Lvl 4 x 15 mins   Cybex Knee Flexion 20# x 20 reps   Cybex Knee Extension 10# x 20 reps   Heel/Toe Raises    Forward Step Up    Rockerboard    Shoulder flexion 2# x 20 reps, bil   Shoulder abduction 2# x 20 reps, bil   LAQ BLE, 3# x 20 reps   Seated Marching BLE, 3# x 20 reps   Ham Curls    Hip Abduction  Red tband x 2 mins   Ball Squeezes  2 mins        Blank cell = exercise not performed today    PATIENT EDUCATION: Education details: HEP, safety, decreased fall risk Person educated: Patient Education method: Explanation, Corporate treasurer cues, and Verbal cues Education  comprehension: verbalized understanding, returned demonstration, verbal cues required, tactile cues required, and needs further education  HOME EDUCATION PROGRAM:  [email or web address]  Access Code: [email or web address]    PT Long Term Goals - 03/23/22 1328       PT LONG TERM GOAL #1   Title Patient will be independent with her HEP.    Time 3    Period Weeks    Status Achieved    Target Date 03/14/22      PT LONG TERM GOAL #2   Title Patient will improve her five time sit to stand to 12 seconds or less to reduce her fall risk.    Baseline 03/23/22: 12.88 secs    Time 3    Period Weeks    Status Achieved    Target Date 03/14/22      PT LONG TERM GOAL #3   Title Patient will be able to ambulate without significant gait deviations for improved mobility.    Time 3    Period Weeks    Status Achieved    Target Date 03/14/22              Plan - 03/23/22 1347     Clinical Impression Statement Pt arrives for today's treatment session reporting 1/10 left knee pain.  Pt introduced to cybex flexion and extension machine today without issue.  Pt able to perform 5 STS in 12.88 seconds today.  Pt given extensive printed HEP of exercises to perform at home as well as information to local gym for further physical fitness.  Pt has met all of her goals set forth for her at this time.  Pt encouraged to call facility with any questions or concerns.  Pt reported 1/10 left knee painat completion of today's treatment session.    Personal Factors and Comorbidities Transportation;Finances;Comorbidity 3+    Comorbidities HTN, history of CVA, a-fib, osteopenia, history of cancer, seizures    Examination-Activity Limitations Locomotion Level    Examination-Participation Restrictions Cleaning    Stability/Clinical Decision Making Stable/Uncomplicated    Rehab Potential Good    PT Frequency 1x / week   due to financial constraints, per patient request   PT Duration 3 weeks    PT Treatment/Interventions  ADLs/Self Care Home Management;Gait training;Stair training;Functional mobility training;Therapeutic activities;Therapeutic exercise;Balance training;Neuromuscular re-education;Patient/family education    PT Next Visit Plan nustep, lower extremity strengthening, and update HEP (green t-band provided at initial evaluation.    PT Home Exercise Plan Access Code: [email or web address]  URL: [email or web address]  Date: 02/21/2022  Prepared by: Jacqulynn Cadet    Exercises  - Seated Hip Abduction with Resistance  - 2 x daily - 7 x weekly -  2 sets - 10 reps  - Seated March with Resistance  - 2 x daily - 7 x weekly - 2 sets - 10 reps  - Sitting Knee Extension with Resistance  - 2 x daily - 7 x weekly - 2 sets - 10 reps    Consulted and Agree with Plan of Care Patient             PHYSICAL THERAPY DISCHARGE SUMMARY  Visits from Start of Care: 3  Current functional level related to goals / functional outcomes: Patient was able to meet all of her goals for therapy and feels comfortable being discharged at this time with her HEP.    Remaining deficits: none   Education / Equipment: HEP    Patient agrees to discharge. Patient goals were met. Patient is being discharged due to meeting the stated rehab goals.  Jacqulynn Cadet, PT, DPT     Kathrynn Ducking, PTA 03/23/2022, 1:49 PM

## 2022-03-24 DIAGNOSIS — Z952 Presence of prosthetic heart valve: Secondary | ICD-10-CM

## 2022-03-24 DIAGNOSIS — I4819 Other persistent atrial fibrillation: Secondary | ICD-10-CM | POA: Diagnosis not present

## 2022-03-31 ENCOUNTER — Telehealth: Payer: Self-pay | Admitting: Family Medicine

## 2022-03-31 DIAGNOSIS — Z79899 Other long term (current) drug therapy: Secondary | ICD-10-CM

## 2022-03-31 NOTE — Telephone Encounter (Signed)
Referral placed for CCM

## 2022-04-01 ENCOUNTER — Telehealth: Payer: Self-pay

## 2022-04-01 NOTE — Chronic Care Management (AMB) (Signed)
  Chronic Care Management   Note  04/01/2022 Name: Tammie Martin MRN: [id number] DOB: July 30, 1951  Tammie Martin is a 71 y.o. year old female who is a primary care patient of Janora Norlander, DO. I reached out to [name] by phone today in response to a referral sent by [name]'s PCP.  [name] was given information about Chronic Care Management services today including:  CCM service includes personalized support from designated clinical staff supervised by her physician, including individualized plan of care and coordination with other care providers 24/7 contact phone numbers for assistance for urgent and routine care needs. Service will only be billed when office clinical staff spend 20 minutes or more in a month to coordinate care. Only one practitioner may furnish and bill the service in a calendar month. The patient may stop CCM services at any time (effective at the end of the month) by phone call to the office staff. The patient is responsible for co-pay (up to 20% after annual deductible is met) if co-pay is required by the individual health plan.   Patient agreed to services and verbal consent obtained.   Follow up plan: Telephone appointment with care management team member scheduled for:05/13/2022  Noreene Larsson, [place], [place] [address] Direct Dial: [phone] [email or web address]

## 2022-04-13 ENCOUNTER — Ambulatory Visit (HOSPITAL_BASED_OUTPATIENT_CLINIC_OR_DEPARTMENT_OTHER)
Admission: RE | Admit: 2022-04-13 | Discharge: 2022-04-13 | Disposition: A | Discharge status: Home / Self Care | Payer: Medicare HMO | Source: Ambulatory Visit | Attending: Cardiology | Admitting: Cardiology

## 2022-04-13 DIAGNOSIS — I700 Atherosclerosis of aorta: Secondary | ICD-10-CM | POA: Diagnosis not present

## 2022-04-13 DIAGNOSIS — R591 Generalized enlarged lymph nodes: Secondary | ICD-10-CM | POA: Diagnosis not present

## 2022-04-13 DIAGNOSIS — J439 Emphysema, unspecified: Secondary | ICD-10-CM | POA: Diagnosis not present

## 2022-04-15 ENCOUNTER — Encounter: Payer: Self-pay | Admitting: Family Medicine

## 2022-04-15 DIAGNOSIS — R599 Enlarged lymph nodes, unspecified: Secondary | ICD-10-CM | POA: Insufficient documentation

## 2022-04-21 ENCOUNTER — Other Ambulatory Visit: Payer: Self-pay | Admitting: Family Medicine

## 2022-04-21 DIAGNOSIS — I4891 Unspecified atrial fibrillation: Secondary | ICD-10-CM

## 2022-04-21 DIAGNOSIS — I509 Heart failure, unspecified: Secondary | ICD-10-CM

## 2022-04-25 ENCOUNTER — Ambulatory Visit: Payer: Medicare HMO | Attending: Cardiology | Admitting: Cardiology

## 2022-04-25 ENCOUNTER — Encounter: Payer: Self-pay | Admitting: Cardiology

## 2022-04-25 VITALS — BP 120/75 | HR 52 | Ht 62.5 in | Wt 118.6 lb

## 2022-04-25 DIAGNOSIS — I2510 Atherosclerotic heart disease of native coronary artery without angina pectoris: Secondary | ICD-10-CM

## 2022-04-25 DIAGNOSIS — Z952 Presence of prosthetic heart valve: Secondary | ICD-10-CM | POA: Diagnosis not present

## 2022-04-25 DIAGNOSIS — D6869 Other thrombophilia: Secondary | ICD-10-CM | POA: Diagnosis not present

## 2022-04-25 DIAGNOSIS — I4819 Other persistent atrial fibrillation: Secondary | ICD-10-CM

## 2022-04-25 NOTE — Patient Instructions (Signed)
Medication Instructions:  Continue all current medications.   Labwork: none  Testing/Procedures: none  Follow-Up: 6 months   Any Other Special Instructions Will Be Listed Below (If Applicable).   If you need a refill on your cardiac medications before your next appointment, please call your pharmacy.  

## 2022-04-25 NOTE — Progress Notes (Signed)
Clinical Summary Ms. Cowper is a 71 y.o.female seen today for follow up of the following medical problems.    1.Afib - diagnosed by pcp 08/24/21, EKG review  does confirm afib - had 3 months of palpitations prior - she is on bisoprolol - ongonig symptoms of palpitations, occurs daily, lasts a few minutes. Can come on with exertion. Some associated SOB.      10/27/2021 DCCV. HRs 50s after, we lowered her bisoprol to '10mg'$  daily - reports recurrent palpitations about 1 week after DCCV, EKG showed back in afib    -discussed outpt monitor after her TAVR -denies any palpitations. HRs 70s to 90s.  - completed heart monitor just recently, results pending       2.Leg edema -seen in ER Jan 2023 with leg edema - BNP elevated 676. CXR mild cardiomegaly, no acute process - LE venous US no DVT. + for large popliteal cyst - received lasix in ER, discharged -11/2015 echo: LVEF 60-65%, grade II dd   - no recent edema.      3. Aortic stenosis s/p TAVR - had TAVR 02/01/22 with Z5GL 87FI , complicated by CVA 2 days after discharge - 02/2022 echo LVEF 60-65%, no WMAs, normal AVR  4. CVA - occurred 2 days after TAVR - CVA felt to be due to left atrial appendage thrombus seen on intraoperative TEE on 02/01/22 with suspicion that she may have developed this thrombus when she was off Eliquis for her TAVR procedure.     4. Pulmonary HTN - PASP 66 mmHg, mod RV enlargement with low normal function, grade II dd - 2017 echo PASP 35 - +smoking history being treated for COPD, grade II DD on echo   10/2021 RHC: mean PA 33, PCWP not reported, LVEDP 11, CI 2.13     5. COPD -carries diagnosis but do not see she has had formal PFTs. Nearlly 50 year smoking history   6. CAD - 10/2021 mild to moderate disease from cath reported below.  - no chest pains.  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 CBurt Knackand Dr. BCyndia Bent  Severe aortic stenosis    Thyroid disease    Tobacco abuse      Allergies  Allergen Reactions   Penicillins Hives   Singulair [Montelukast Sodium]     headache   Codeine Rash   Nicoderm [Nicotine] Hives and Swelling   Other Rash    Band-aids cause rash after a few days     Current Outpatient Medications  Medication Sig Dispense Refill   albuterol (VENTOLIN HFA) 108 (90 Base) MCG/ACT inhaler Inhale 2 puffs into the lungs every 6 (six) hours as needed for wheezing or shortness of breath. 8 g 0   atorvastatin (LIPITOR) 20 MG tablet Take 2 tablets (40 mg total) by mouth daily. 60 tablet 3   atorvastatin (LIPITOR) 40 MG tablet Take 40 mg by mouth daily.     azithromycin (ZITHROMAX) 500 MG tablet Take 1 tablet (500 mg total) by  mouth as directed. TAKE 500 MG  1 HOUR PRIOR TO ANY DENTAL  CLEANINGS AND PROCEDURES. 4 tablet 6   bisoprolol (ZEBETA) 10 MG tablet Take 1.5 tablets (15 mg total) by mouth daily. 135 tablet 3   CALCIUM-MAGNESIUM PO Take 1 tablet by mouth daily.     cetirizine (ZYRTEC) 10 MG tablet Take 10 mg by mouth daily.     Cholecalciferol (VITAMIN D) 50 MCG (2000 UT) tablet Take 2,000 Units by mouth daily.     desvenlafaxine (PRISTIQ) 50 MG 24 hr tablet TAKE ONE TABLET BY MOUTH DAILY 90 tablet 1   ELIQUIS 5 MG TABS tablet TAKE ONE TABLET BY MOUTH TWICE DAILY 60 tablet 1   fluticasone (FLONASE) 50 MCG/ACT nasal spray Place 2 sprays into both nostrils daily as needed for allergies or rhinitis. 48 g 3   furosemide (LASIX) 20 MG tablet TAKE ONE TABLET BY MOUTH DAILY 30 tablet 1   hydrocortisone cream 1 % Apply 1 application. topically 2 (two)  times daily as needed (rash).     levETIRAcetam (KEPPRA) 750 MG tablet Take 1 tablet (750 mg total) by mouth 2 (two) times daily. 180 tablet 3   levothyroxine (SYNTHROID) 88 MCG tablet Take 1 tablet by mouth daily. 90 tablet 2   Multiple Vitamins-Minerals (ALIVE WOMENS 50+ PO) Take 1 tablet by mouth daily.     Polyethyl Glycol-Propyl Glycol (SYSTANE OP) Place 1 drop into both eyes daily as needed (dry eyes).     potassium chloride SA (KLOR-CON M) 10 MEQ tablet Take 1 tablet (10 mEq total) by mouth daily. 30 tablet 3   Probiotic Product (PROBIOTIC ADVANCED PO) Take 1 capsule by mouth daily.     traZODone (DESYREL) 50 MG tablet Take 1 tablet (50 mg total) by mouth at bedtime as needed for sleep. 90 tablet 3   TURMERIC PO Take 1 capsule by mouth 2 (two) times daily.     umeclidinium-vilanterol (ANORO ELLIPTA) 62.5-25 MCG/ACT AEPB Inhale 1 puff into the lungs daily at 6 (six) AM. 60 each 12   acetaminophen (TYLENOL) 500 MG tablet Take 1,000 mg by mouth every 6 (six) hours as needed for moderate pain. (Patient not taking: Reported on 03/21/2022)     Multiple Vitamins-Minerals (PRESERVISION AREDS 2 PO) Take 1 capsule by mouth in the morning and at bedtime. (Patient not taking: Reported on 03/21/2022)     Omega-3-6-9 CAPS Take 1 capsule by mouth 2 (two) times daily. (Patient not taking: Reported on 03/21/2022)     Rimegepant Sulfate (NURTEC) 75 MG TBDP Dissolve 1 tablet in mouth ONCE per day as needed for migraine headache not relieved by Tylenol. (Patient not taking: Reported on 03/21/2022) 8 tablet 0   No current facility-administered medications for this visit.     Past Surgical History:  Procedure Laterality Date   AORTIC VALVE REPLACEMENT     APPENDECTOMY     pt recently had CT that said Appendix was there so she is unsure if this was actually removed during hysterectomy as she was previously told   BREAST ENHANCEMENT SURGERY     CARDIOVERSION N/A 10/27/2021   Procedure: CARDIOVERSION;  Surgeon:  Arnoldo Lenis, MD;  Location: [facility];  Service: Endoscopy;  Laterality: N/A;   INTRAOPERATIVE TRANSESOPHAGEAL ECHOCARDIOGRAM N/A 02/01/2022   Procedure: INTRAOPERATIVE TRANSESOPHAGEAL ECHOCARDIOGRAM;  Surgeon: Sherren Mocha, MD;  Location: [facility];  Service: Open Heart Surgery;  Laterality: N/A;   RIGHT/LEFT HEART CATH AND CORONARY ANGIOGRAPHY N/A 11/22/2021   Procedure: RIGHT/LEFT HEART CATH  AND CORONARY ANGIOGRAPHY;  Surgeon: Sherren Mocha, MD;  Location: [facility];  Service: Cardiovascular;  Laterality: N/A;   TONSILLECTOMY     VAGINAL HYSTERECTOMY       Allergies  Allergen Reactions   Penicillins Hives   Singulair [Montelukast Sodium]     headache   Codeine Rash   Nicoderm [Nicotine] Hives and Swelling   Other Rash    Band-aids cause rash after a few days      Family History  Problem Relation Age of Onset   Cancer Mother        breast cancer at 76    Kidney disease Mother    Cancer Maternal Aunt        colon cancer   Heart attack Father    Diabetes Sister    Obesity Sister    Depression Sister    Alcohol abuse Brother    Cirrhosis Brother    Depression Brother    Liver disease Brother    Diabetes Daughter    Fibromyalgia Daughter    Depression Daughter    Anxiety disorder Daughter    Arthritis Daughter    Depression Sister    Obesity Sister      Social History Ms. Melka reports that she has been smoking cigarettes. She has a 40.00 pack-year smoking history. She has never used smokeless tobacco. Ms. Procell reports that she does not currently use alcohol.   Review of Systems CONSTITUTIONAL: No weight loss, fever, chills, weakness or fatigue.  HEENT: Eyes: No visual loss, blurred vision, double vision or yellow sclerae.No hearing loss, sneezing, congestion, runny nose or sore throat.  SKIN: No rash or itching.  CARDIOVASCULAR: per hpi RESPIRATORY: No shortness of breath, cough or sputum.  GASTROINTESTINAL: No anorexia, nausea,  vomiting or diarrhea. No abdominal pain or blood.  GENITOURINARY: No burning on urination, no polyuria NEUROLOGICAL: No headache, dizziness, syncope, paralysis, ataxia, numbness or tingling in the extremities. No change in bowel or bladder control.  MUSCULOSKELETAL: No muscle, back pain, joint pain or stiffness.  LYMPHATICS: No enlarged nodes. No history of splenectomy.  PSYCHIATRIC: No history of depression or anxiety.  ENDOCRINOLOGIC: No reports of sweating, cold or heat intolerance. No polyuria or polydipsia.  Marland Kitchen   Physical Examination Vitals:   04/25/22 1447 04/25/22 1526  BP: (!) 138/92 120/75  Pulse: (!) 52   SpO2: 95%    Filed Weights   04/25/22 1447  Weight: 118 lb 9.6 oz (53.8 kg)    Gen: resting comfortably, no acute distress HEENT: no scleral icterus, pupils equal round and reactive, no palptable cervical adenopathy,  CV: irreg, no m/r/g no jvd Resp: Clear to auscultation bilaterally GI: abdomen is soft, non-tender, non-distended, normal bowel sounds, no hepatosplenomegaly MSK: extremities are warm, no edema.  Skin: warm, no rash Neuro:  no focal deficits Psych: appropriate affect   Diagnostic Studies 11/2015 echo Study Conclusions   - Left ventricle: The cavity size was normal. Wall thickness was    increased in a pattern of mild LVH. Systolic function was normal.    The estimated ejection fraction was in the range of 60% to 65%.    Wall motion was normal; there were no regional wall motion    abnormalities. Features are consistent with a pseudonormal left    ventricular filling pattern, with concomitant abnormal relaxation    and increased filling pressure (grade 2 diastolic dysfunction).    Doppler parameters are consistent with high ventricular filling    pressure.  -  Aortic valve: Moderately calcified annulus. Trileaflet;    moderately thickened leaflets. There was mild stenosis. There was    mild regurgitation. Valve area (VTI): 1.79 cm^2. Valve area     (Vmax): 1.63 cm^2.  - Mitral valve: Moderately calcified annulus. Mildly thickened    leaflets . There was mild regurgitation.  - Left atrium: The atrium was mildly dilated.  - Right atrium: The atrium was mildly dilated.  - Atrial septum: No defect or patent foramen ovale was identified.  - Pulmonary arteries: Systolic pressure was mildly increased. PA    peak pressure: 35 mm Hg (S).  - Technically adequate study.      10/2021 cath 1.  Patent left main with no significant stenosis. 2.  Patent LAD with mild calcific 40% stenosis just beyond the first diagonal/first septal perforator 3.  Patent left circumflex with mild nonobstructive 40% stenosis 4.  Patent right coronary artery mild to moderate 40 to 50% stenosis in the proximal and mid vessel 5.  Heavy mitral annular calcification 6.  Calcified, restricted aortic valve with peak to peak gradient of 33 mmHg, mean gradient 31 mmHg, calculated aortic valve area 0.7 cm, findings consistent with severe aortic stenosis 7.  Mild pulmonary hypertension with PA pressure 42/25 mean 33 mmHg      Assessment and Plan  1.Persistent afib/acquired thrombophilia - recurrent afib after recent DCCV, will focus on rate control.  -just completed monitor, follow up results regarding afib burden and rates.    2. Aortic stenosis s/p TAVR - doing well s/p TAVR, continue to monitor.    3. Pulmonary HTN - noted on recent echo, severe by PASP. - recent [place] with just mild pulm HTN mean PASP 33. Would be explained by her COPD, I don't see strong indciation for expansive pulm HTN at this time given PA pressures were just mild   4. CAD - mild to moderate by recent cath -no symptoms, continue current meds  F/u 6 months      Arnoldo Lenis, M.D.

## 2022-04-28 DIAGNOSIS — I4819 Other persistent atrial fibrillation: Secondary | ICD-10-CM | POA: Diagnosis not present

## 2022-04-28 DIAGNOSIS — Z952 Presence of prosthetic heart valve: Secondary | ICD-10-CM | POA: Diagnosis not present

## 2022-05-13 ENCOUNTER — Ambulatory Visit: Payer: Medicare HMO | Admitting: Pharmacist

## 2022-05-13 DIAGNOSIS — J449 Chronic obstructive pulmonary disease, unspecified: Secondary | ICD-10-CM

## 2022-05-13 DIAGNOSIS — R569 Unspecified convulsions: Secondary | ICD-10-CM

## 2022-05-13 DIAGNOSIS — I4891 Unspecified atrial fibrillation: Secondary | ICD-10-CM

## 2022-05-13 NOTE — Progress Notes (Signed)
Chronic Care Management Pharmacy Note  05/13/2022 Name:  Tammie Martin MRN:  [id number] DOB:  [birth year]  Summary:  Chronic Obstructive Pulmonary Disease:  New goal. Uncontrolled; current treatment: Anoro (can't afford) --> will transition to [facility] (ease of patient assistance; patient needs more control) 2 puffs twice daily--samples given Current pack per day smoker with 40 pack yr history; recent stroke in July 2023 Interested in chantix -- worried about cost, attempting to find patient assistance Most recent Pulmonary Function Testing: n/a 0 exacerbations requiring treatment in the last 6 months  Assessed patient finances. Enrolled in breztri patient assistance via az&me patient assistance program  Tobacco Abuse:  New goal. 1 packs per day; 40 years of use; does smoke within 30 minutes of waking up Previous quit attempts:  0 Triggers to smoke:  Motivation to quit smoking: recent stroke On a scale of 1-10, how IMPORTANT is it for you to quit smoking: 10  On a scale of 1-10, how CONFIDENT are you that you can quit smoking: 5 Recommended chantix --working on patient assistance  Subjective: Tammie Martin is an 71 y.o. year old female who is a primary patient of Janora Norlander, DO.  The CCM team was consulted for assistance with disease management and care coordination needs.    Engaged with patient face to face for follow up visit in response to provider referral for pharmacy case management and/or care coordination services.   Consent to Services:  The patient was given information about Chronic Care Management services, agreed to services, and gave verbal consent prior to initiation of services.  Please see initial visit note for detailed documentation.   Patient Care Team: Janora Norlander, DO as PCP - General (Family Medicine) Arnoldo Lenis, MD as PCP - Cardiology (Cardiology) Alric Ran, MD as Consulting Physician (Neurology) Lavera Guise, [facility]  as Pharmacist (Family Medicine)  Objective:  Lab Results  Component Value Date   CREATININE 0.70 02/25/2022   CREATININE 0.67 02/11/2022   CREATININE 0.62 02/10/2022    Lab Results  Component Value Date   HGBA1C 5.6 02/04/2022   Last diabetic Eye exam: No results found for: "HMDIABEYEEXA"  Last diabetic Foot exam: No results found for: "HMDIABFOOTEX"      Component Value Date/Time   CHOL 133 02/05/2022 0941   CHOL 144 04/15/2021 1328   TRIG 89 02/05/2022 0941   HDL 36 (L) 02/05/2022 0941   HDL 41 04/15/2021 1328   CHOLHDL 3.7 02/05/2022 0941   VLDL 18 02/05/2022 0941   LDLCALC 79 02/05/2022 0941   LDLCALC 84 04/15/2021 1328       Latest Ref Rng & Units 03/14/2022   11:13 AM 02/25/2022    2:10 PM 02/10/2022    1:04 AM  Hepatic Function  Total Protein 6.0 - 8.5 g/dL 6.9  7.3  6.1   Albumin 3.8 - 4.8 g/dL 3.8  4.1  2.9   AST 0 - 40 IU/L 26  70  29   ALT 0 - 32 IU/L 27  67  34   Alk Phosphatase 44 - 121 IU/L 149  160  116   Total Bilirubin 0.0 - 1.2 mg/dL 0.3  0.3  0.7   Bilirubin, Direct 0.00 - 0.40 mg/dL 0.13       Lab Results  Component Value Date/Time   TSH 0.399 02/05/2022 12:49 AM   TSH 1.810 08/24/2021 04:55 PM   TSH 0.887 04/15/2021 01:28 PM   FREET4 1.54 08/24/2021 04:55 PM  FREET4 1.64 04/15/2021 01:28 PM       Latest Ref Rng & Units 02/25/2022    2:10 PM 02/10/2022    1:04 AM 02/08/2022    2:01 AM  CBC  WBC 3.4 - 10.8 x10E3/uL 9.3  11.2  12.1   Hemoglobin 11.1 - 15.9 g/dL 14.6  13.4  13.4   Hematocrit 34.0 - 46.6 % 42.8  39.6  39.8   Platelets 150 - 450 x10E3/uL 182  175  142     No results found for: "VD25OH"  Clinical ASCVD: Yes  The ASCVD Risk score (Arnett DK, et al., 2019) failed to calculate for the following reasons:   The patient has a prior MI or stroke diagnosis    Other: (CHADS2VASc if Afib, PHQ9 if depression, MMRC or CAT for COPD, ACT, DEXA)  Social History   Tobacco Use  Smoking Status Every Day   Packs/day: 1.00   Years:  40.00   Total pack years: 40.00   Types: Cigarettes  Smokeless Tobacco Never   BP Readings from Last 3 Encounters:  04/25/22 120/75  03/21/22 117/89  03/08/22 109/78   Pulse Readings from Last 3 Encounters:  04/25/22 (!) 52  03/21/22 83  03/08/22 84   Wt Readings from Last 3 Encounters:  04/25/22 118 lb 9.6 oz (53.8 kg)  03/21/22 116 lb (52.6 kg)  03/08/22 114 lb (51.7 kg)    Assessment: Review of patient past medical history, allergies, medications, health status, including review of consultants reports, laboratory and other test data, was performed as part of comprehensive evaluation and provision of chronic care management services.   SDOH:  (Social Determinants of Health) assessments and interventions performed:  SDOH Interventions    Flowsheet Row Office Visit from 06/16/2021 in [facility] from 06/03/2021 in [place] Visit from 01/13/2021 in [place] Interventions     Food Insecurity Interventions -- Intervention Not Indicated --  Housing Interventions -- Intervention Not Indicated --  Transportation Interventions -- Intervention Not Indicated --  Depression Interventions/Treatment  Currently on Treatment Currently on Treatment Medication  Financial Strain Interventions -- Intervention Not Indicated --  Physical Activity Interventions -- Intervention Not Indicated --  Stress Interventions -- Intervention Not Indicated --  Social Connections Interventions -- Intervention Not Indicated --       CCM Care Plan  Allergies  Allergen Reactions   Penicillins Hives   Singulair [Montelukast Sodium]     headache   Codeine Rash   Nicoderm [Nicotine] Hives and Swelling   Other Rash    Band-aids cause rash after a few days    Medications Reviewed Today     Reviewed by Lavera Guise, [facility] (Pharmacist) on 05/13/22 at [place] List Status: <None>   Medication Order  Taking? Sig Documenting Provider Last Dose Status Informant  acetaminophen (TYLENOL) 500 MG tablet [id number]  Take 1,000 mg by mouth every 6 (six) hours as needed for moderate pain.  Patient not taking: Reported on 03/21/2022   [provider]  Active Pharmacy Records, Family Member  albuterol (VENTOLIN HFA) 108 (90 Base) MCG/ACT inhaler [id number]  Inhale 2 puffs into the lungs every 6 (six) hours as needed for wheezing or shortness of breath. Janora Norlander, DO  Active Pharmacy Records, Family Member           Med Note [facility], Earvin Hansen Feb 16, 2022  9:51 AM)  atorvastatin (LIPITOR) 40 MG tablet [id number]  Take 40 mg by mouth daily. [provider]  Active   azithromycin (ZITHROMAX) 500 MG tablet [id number]  Take 1 tablet (500 mg total) by mouth as directed. TAKE 500 MG  1 HOUR PRIOR TO ANY DENTAL  CLEANINGS AND PROCEDURES. Kathyrn Drown D, NP  Active   bisoprolol (ZEBETA) 10 MG tablet [id number]  Take 1.5 tablets (15 mg total) by mouth daily. Arnoldo Lenis, MD  Active Pharmacy Records, Family Member  CALCIUM-MAGNESIUM PO [id number]  Take 1 tablet by mouth daily. [provider]  Active Pharmacy Records, Family Member  cetirizine (ZYRTEC) 10 MG tablet [id number]  Take 10 mg by mouth daily. [provider]  Active Pharmacy Records, Family Member  Cholecalciferol (VITAMIN D) 50 MCG (2000 UT) tablet [id number]  Take 2,000 Units by mouth daily. [provider]  Active Pharmacy Records, Family Member  desvenlafaxine (PRISTIQ) 50 MG 24 hr tablet [id number]  TAKE ONE TABLET BY MOUTH DAILY Janora Norlander, DO  Active   ELIQUIS 5 MG TABS tablet [id number]  TAKE ONE TABLET BY MOUTH TWICE DAILY Ronnie Doss M, DO  Active   fluticasone (FLONASE) 50 MCG/ACT nasal spray [id number]  Place 2 sprays into both nostrils daily as needed for allergies or rhinitis. Janora Norlander, DO  Active Pharmacy Records, Family Member  furosemide (LASIX) 20  MG tablet [id number]  TAKE ONE TABLET BY MOUTH DAILY Ronnie Doss M, DO  Active   hydrocortisone cream 1 % [id number]  Apply 1 application. topically 2 (two) times daily as needed (rash). [provider]  Active Pharmacy Records, Family Member  levETIRAcetam (KEPPRA) 750 MG tablet [id number]  Take 1 tablet (750 mg total) by mouth 2 (two) times daily. Alric Ran, MD  Active   levothyroxine (SYNTHROID) 88 MCG tablet [id number]  Take 1 tablet by mouth daily. Janora Norlander, DO  Active Pharmacy Records, Family Member  Multiple Vitamins-Minerals (ALIVE WOMENS 50+ PO) [id number]  Take 1 tablet by mouth daily. [provider]  Active Pharmacy Records, Family Member  Omega-3-6-9 CAPS [id number] Yes Take 1 capsule by mouth 2 (two) times daily. [provider] Taking Active Pharmacy Records, Family Member  Polyethyl Glycol-Propyl Glycol [place] OP) [id number] Yes Place 1 drop into both eyes daily as needed (dry eyes). [provider] Taking Active Pharmacy Records, Family Member  potassium chloride SA (KLOR-CON M) 10 MEQ tablet [id number] Yes Take 1 tablet (10 mEq total) by mouth daily. Eileen Stanford, PA-C Taking Active Pharmacy Records, Family Member  Probiotic Product (PROBIOTIC ADVANCED PO) [id number] Yes Take 1 capsule by mouth daily. [provider] Taking Active Pharmacy Records, Family Member  traZODone (DESYREL) 50 MG tablet [id number]  Take 1 tablet (50 mg total) by mouth at bedtime as needed for sleep. Janora Norlander, DO  Active   TURMERIC PO [id number] Yes Take 1 capsule by mouth 2 (two) times daily. [provider] Taking Active Pharmacy Records, Family Member            Patient Active Problem List   Diagnosis Date Noted   Lymph node enlargement 83/41/9622   Acute metabolic encephalopathy 29/79/8921   Acute respiratory failure with hypoxia ([place]) 02/07/2022   Depression 02/07/2022   A-fib ([place]) 02/07/2022   Seizure (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   History of cervical cancer 02/01/2022   Severe aortic stenosis 01/17/2022  Tobacco abuse 01/17/2022   Osteopenia 03/11/2020   Olecranon bursitis of left elbow 11/26/2017   Essential hypertension, benign 11/20/2015   Hyperlipidemia LDL goal <130 11/20/2015   Hypothyroidism 11/20/2015   Generalized anxiety disorder 11/20/2015    Immunization History  Administered Date(s) Administered   Fluad Quad(high Dose 65+) 06/02/2020, 05/31/2021   Influenza, High Dose Seasonal PF 07/05/2017, 07/09/2018   Influenza,inj,Quad PF,6+ Mos 07/19/2016   PFIZER(Purple Top)SARS-COV-2 Vaccination 09/18/2019, 10/09/2019, 05/26/2020   Pneumococcal Conjugate-13 05/05/2016   Pneumococcal Polysaccharide-23 05/23/2013, 06/10/2020    Conditions to be addressed/monitored: CAD and COPD  Care Plan : PHARMD MEDICATION MANAGEMENT  Updates made by Lavera Guise, [place] since 05/27/2022 12:00 AM     Problem: DISEASE PROGRESSION PREVENTION      Long-Range Goal: COPD, SMOKING CESSATION   This Visit's Progress: Not on track  Priority: High  Note:   Current Barriers:  Unable to independently afford treatment regimen Unable to maintain control of COPD Suboptimal therapeutic regimen for COPD  Pharmacist Clinical Goal(s):  patient will verbalize ability to afford treatment regimen maintain control of COPD as evidenced by IMPROVED BREATHING, QUALITY OF LIFE  through collaboration with PharmD and provider.    Interventions: 1:1 collaboration with Janora Norlander, DO regarding development and update of comprehensive plan of care as evidenced by provider attestation and co-signature Inter-disciplinary care team collaboration (see longitudinal plan of care) Comprehensive medication review performed; medication list updated in electronic medical record  Chronic  Obstructive Pulmonary Disease:  New goal. Uncontrolled; current treatment: Anoro (can't afford) --> will transition to [facility] (ease of patient assistance; patient needs more control) 2 puffs twice daily--samples given Current pack per day smoker with 40 pack yr history; recent stroke in July 2023 Interested in chantix -- worried about cost, attempting to find patient assistance Most recent Pulmonary Function Testing: n/a 0 exacerbations requiring treatment in the last 6 months  Assessed patient finances. Enrolled in breztri patient assistance via az&me patient assistance program  Tobacco Abuse:  New goal. 1 packs per day; 40 years of use; does smoke within 30 minutes of waking up Previous quit attempts:  0 Triggers to smoke:  Motivation to quit smoking: recent stroke On a scale of 1-10, how IMPORTANT is it for you to quit smoking: 10  On a scale of 1-10, how CONFIDENT are you that you can quit smoking: 5 Recommended chantix --working on patient assistance   Patient Goals/Self-Care Activities patient will:  - take medications as prescribed as evidenced by patient report and record review collaborate with provider on medication access solutions      Medication Assistance: Application for breztri (az&me)  medication assistance program. in process.  Anticipated assistance start date tbd.  See plan of care for additional detail.  Follow Up:  Patient agrees to Care Plan and Follow-up.  Plan: Telephone follow up appointment with care management team member scheduled for:  2 months   Regina Eck, PharmD, BCPS Clinical Pharmacist, [place]  II Phone [phone]

## 2022-05-23 ENCOUNTER — Other Ambulatory Visit: Payer: Self-pay | Admitting: Family Medicine

## 2022-05-23 DIAGNOSIS — I509 Heart failure, unspecified: Secondary | ICD-10-CM

## 2022-05-26 ENCOUNTER — Telehealth: Payer: Self-pay

## 2022-05-26 NOTE — Telephone Encounter (Signed)
Received notification from AZ&ME regarding approval for BREZTRI 160. Patient assistance approved from 05/23/22 to 08/28/22.  Phone: [phone]

## 2022-05-30 ENCOUNTER — Ambulatory Visit: Payer: Medicare HMO | Admitting: Family Medicine

## 2022-06-07 ENCOUNTER — Encounter: Payer: Self-pay | Admitting: Family Medicine

## 2022-06-07 DIAGNOSIS — R5383 Other fatigue: Secondary | ICD-10-CM

## 2022-06-07 DIAGNOSIS — E034 Atrophy of thyroid (acquired): Secondary | ICD-10-CM

## 2022-06-08 ENCOUNTER — Ambulatory Visit (INDEPENDENT_AMBULATORY_CARE_PROVIDER_SITE_OTHER): Payer: Medicare HMO | Admitting: Pharmacist

## 2022-06-08 DIAGNOSIS — I639 Cerebral infarction, unspecified: Secondary | ICD-10-CM

## 2022-06-08 DIAGNOSIS — E785 Hyperlipidemia, unspecified: Secondary | ICD-10-CM

## 2022-06-08 MED ORDER — VARENICLINE TARTRATE (STARTER) 0.5 MG X 11 & 1 MG X 42 PO TBPK: ORAL_TABLET | ORAL | 0 refills | Status: DC

## 2022-06-08 NOTE — Progress Notes (Signed)
error 

## 2022-06-10 ENCOUNTER — Other Ambulatory Visit: Payer: Self-pay | Admitting: Family Medicine

## 2022-06-10 ENCOUNTER — Encounter: Payer: Self-pay | Admitting: Family Medicine

## 2022-06-10 ENCOUNTER — Ambulatory Visit (INDEPENDENT_AMBULATORY_CARE_PROVIDER_SITE_OTHER): Payer: Medicare HMO | Admitting: Family Medicine

## 2022-06-10 VITALS — BP 121/82 | HR 88 | Temp 98.7°F | Ht 62.5 in | Wt 118.6 lb

## 2022-06-10 DIAGNOSIS — J449 Chronic obstructive pulmonary disease, unspecified: Secondary | ICD-10-CM | POA: Diagnosis not present

## 2022-06-10 DIAGNOSIS — E559 Vitamin D deficiency, unspecified: Secondary | ICD-10-CM

## 2022-06-10 DIAGNOSIS — E034 Atrophy of thyroid (acquired): Secondary | ICD-10-CM | POA: Diagnosis not present

## 2022-06-10 DIAGNOSIS — Z23 Encounter for immunization: Secondary | ICD-10-CM | POA: Diagnosis not present

## 2022-06-10 DIAGNOSIS — R5383 Other fatigue: Secondary | ICD-10-CM

## 2022-06-10 DIAGNOSIS — I4891 Unspecified atrial fibrillation: Secondary | ICD-10-CM

## 2022-06-10 NOTE — Progress Notes (Signed)
Subjective: CC:*** PCP: Tammie Norlander, DO VZD:GLOVF [name] is a 71 y.o. female presenting to clinic today for:  1. 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 CBurt Knackand Dr. BCyndia Bent  Severe aortic stenosis    Thyroid disease    Tobacco abuse     Current Outpatient Medications:    albuterol (VENTOLIN HFA) 108 (90 Base) MCG/ACT inhaler, Inhale 2 puffs into the lungs every 6 (six) hours as needed for wheezing or shortness of breath., Disp: 8 g, Rfl: 0   atorvastatin (LIPITOR) 40 MG tablet, Take 40 mg by mouth daily., Disp: , Rfl:    azithromycin (ZITHROMAX) 500 MG tablet, Take 1 tablet (500 mg total) by mouth as directed. TAKE 500 MG  1 HOUR PRIOR TO ANY DENTAL  CLEANINGS AND PROCEDURES., Disp: 4 tablet, Rfl: 6   bisoprolol (ZEBETA) 10 MG tablet, Take 1.5 tablets (15 mg total) by mouth daily., Disp: 135 tablet, Rfl: 3   CALCIUM-MAGNESIUM PO, Take 1 tablet by mouth daily., Disp: , Rfl:    cetirizine (ZYRTEC) 10 MG tablet, Take 10 mg by mouth daily., Disp: , Rfl:    Cholecalciferol (VITAMIN D) 50 MCG (2000 UT) tablet, Take 2,000 Units by mouth daily., Disp: , Rfl:    desvenlafaxine (PRISTIQ) 50 MG 24 hr tablet, TAKE ONE TABLET BY MOUTH DAILY, Disp: 90 tablet, Rfl: 1   ELIQUIS 5 MG TABS tablet, TAKE ONE TABLET BY MOUTH TWICE DAILY, Disp: 60  tablet, Rfl: 1   fluticasone (FLONASE) 50 MCG/ACT nasal spray, Place 2 sprays into both nostrils daily as needed for allergies or rhinitis., Disp: 48 g, Rfl: 3   furosemide (LASIX) 20 MG tablet, TAKE ONE TABLET BY MOUTH DAILY, Disp: 30 tablet, Rfl: 0   hydrocortisone cream 1 %, Apply 1 application. topically 2 (two) times daily as needed (rash)., Disp: , Rfl:    levETIRAcetam (KEPPRA) 750 MG tablet, Take 1 tablet (750 mg total) by mouth 2 (two) times daily., Disp: 180 tablet, Rfl: 3   levothyroxine (SYNTHROID) 88 MCG tablet, Take 1 tablet by mouth daily., Disp: 90 tablet, Rfl: 2   Multiple Vitamins-Minerals (ALIVE WOMENS 50+ PO), Take 1 tablet by mouth daily., Disp: , Rfl:    Omega-3-6-9 CAPS, Take 1 capsule by mouth 2 (two) times daily., Disp: , Rfl:    Polyethyl Glycol-Propyl Glycol (SYSTANE OP), Place 1 drop into both eyes daily as needed (dry eyes)., Disp: , Rfl:    potassium chloride SA (KLOR-CON M) 10 MEQ tablet, Take 1 tablet (10 mEq total) by mouth daily., Disp: 30 tablet, Rfl: 3   Probiotic Product (PROBIOTIC ADVANCED PO), Take 1 capsule by mouth daily., Disp: , Rfl:    traZODone (DESYREL) 50 MG  tablet, Take 1 tablet (50 mg total) by mouth at bedtime as needed for sleep., Disp: 90 tablet, Rfl: 3   TURMERIC PO, Take 1 capsule by mouth 2 (two) times daily., Disp: , Rfl:    Varenicline Tartrate, Starter, (CHANTIX STARTING MONTH PAK) 0.5 MG X 11 & 1 MG X 42 TBPK, Initial, 0.5 mg orally once daily for 3 days, then 0.5 mg twice daily on days 4 through 7, and then 1 mg twice daily thereafter for 12 weeks of treatment, Disp: 1 each, Rfl: 0 Social History   Socioeconomic History   Marital status: Widowed    Spouse name: Not on file   Number of children: 2   Years of education: 14   Highest education level: Associate degree: academic program  Occupational History   Occupation: Retired    Comment: Equities trader  Tobacco Use   Smoking status: Every Day    Packs/day: 1.00    Years: 40.00     Total pack years: 40.00    Types: Cigarettes   Smokeless tobacco: Never  Vaping Use   Vaping Use: Never used  Substance and Sexual Activity   Alcohol use: Not Currently   Drug use: No   Sexual activity: Not Currently  Other Topics Concern   Not on file  Social History Narrative   Lives alone - one cat, one dog   Social Determinants of Health   Financial Resource Strain: Low Risk  (06/03/2021)   Overall Financial Resource Strain (CARDIA)    Difficulty of Paying Living Expenses: Not hard at all  Food Insecurity: No Food Insecurity (06/03/2021)   Hunger Vital Sign    Worried About Running Out of Food in the Last Year: Never true    [place] in the Last Year: Never true  Transportation Needs: No Transportation Needs (06/03/2021)   PRAPARE - Hydrologist (Medical): No    Lack of Transportation (Non-Medical): No  Physical Activity: Inactive (06/03/2021)   Exercise Vital Sign    Days of Exercise per Week: 0 days    Minutes of Exercise per Session: 0 min  Stress: No Stress Concern Present (06/03/2021)   [place]    Feeling of Stress : Not at all  Social Connections: Moderately Isolated (06/03/2021)   Social Connection and Isolation Panel [NHANES]    Frequency of Communication with Friends and Family: More than three times a week    Frequency of Social Gatherings with Friends and Family: More than three times a week    Attends Religious Services: Never    Marine scientist or Organizations: Yes    Attends Music therapist: More than 4 times per year    Marital Status: Widowed  Intimate Partner Violence: Not At Risk (06/03/2021)   Humiliation, Afraid, Rape, and Kick questionnaire    Fear of Current or Ex-Partner: No    Emotionally Abused: No    Physically Abused: No    Sexually Abused: No   Family History  Problem Relation Age of Onset   Cancer Mother         breast cancer at 19    Kidney disease Mother    Cancer Maternal Aunt        colon cancer   Heart attack Father    Diabetes Sister    Obesity Sister    Depression Sister    Alcohol abuse Brother    Cirrhosis Brother  Depression Brother    Liver disease Brother    Diabetes Daughter    Fibromyalgia Daughter    Depression Daughter    Anxiety disorder Daughter    Arthritis Daughter    Depression Sister    Obesity Sister     Objective: Office vital signs reviewed. BP 121/82   Pulse 88   Temp 98.7 F (37.1 C)   Ht 5' 2.5" (1.588 m)   Wt 118 lb 9.6 oz (53.8 kg)   SpO2 93%   BMI 21.35 kg/m   Physical Examination:  General: Awake, alert, *** nourished, No acute distress HEENT: Normal    Neck: No masses palpated. No lymphadenopathy    Ears: Tympanic membranes intact, normal light reflex, no erythema, no bulging    Eyes: PERRLA, extraocular membranes intact, sclera ***    Nose: nasal turbinates moist, *** nasal discharge    Throat: moist mucus membranes, no erythema, *** tonsillar exudate.  Airway is patent Cardio: regular rate and rhythm, S1S2 heard, no murmurs appreciated Pulm: clear to auscultation bilaterally, no wheezes, rhonchi or rales; normal work of breathing on room air GI: soft, non-tender, non-distended, bowel sounds present x4, no hepatomegaly, no splenomegaly, no masses GU: external vaginal tissue ***, cervix ***, *** punctate lesions on cervix appreciated, *** discharge from cervical os, *** bleeding, *** cervical motion tenderness, *** abdominal/ adnexal masses Extremities: warm, well perfused, No edema, cyanosis or clubbing; +*** pulses bilaterally MSK: *** gait and *** station Skin: dry; intact; no rashes or lesions Neuro: *** Strength and light touch sensation grossly intact, *** DTRs ***/4  Assessment/ Plan: 71 y.o. female   ***  Orders Placed This Encounter  Procedures   Flu Vaccine QUAD High Dose(Fluad)   No orders of the defined types were  placed in this encounter.    Tammie Norlander, DO [place] [phone]

## 2022-06-11 LAB — T4, FREE: Free T4: 1.34 ng/dL (ref 0.82–1.77)

## 2022-06-11 LAB — VITAMIN B12: Vitamin B-12: 1022 pg/mL (ref 232–1245)

## 2022-06-11 LAB — TSH: TSH: 6.06 u[IU]/mL — ABNORMAL HIGH (ref 0.450–4.500)

## 2022-06-14 LAB — VITAMIN D 25 HYDROXY (VIT D DEFICIENCY, FRACTURES): Vit D, 25-Hydroxy: 76.8 ng/mL (ref 30.0–100.0)

## 2022-06-14 LAB — SPECIMEN STATUS REPORT

## 2022-06-21 NOTE — Progress Notes (Signed)
Chronic Care Management Pharmacy Note  06/08/2022 Name:  Tammie Martin MRN:  [id number] DOB:  09-26-1950  Summary:  Chronic Obstructive Pulmonary Disease:  Goal on Track (progressing): YES. Uncontrolled; current treatment: Anoro (can't afford) --> will transition to [facility] (ease of patient assistance; patient needs more control) 2 puffs twice daily Current pack per day smoker with 40 pack yr history; recent stroke in July 2023 Interested in chantix -- worried about cost Most recent Pulmonary Function Testing: n/a 0 exacerbations requiring treatment in the last 6 months  Assessed patient finances. Enrolled in breztri patient assistance via az&me patient assistance program  Tobacco Abuse:  New goal. 1 packs per day; 40 years of use; does smoke within 30 minutes of waking up Previous quit attempts:  0 Triggers to smoke:  Motivation to quit smoking: recent stroke On a scale of 1-10, how IMPORTANT is it for you to quit smoking: 10  On a scale of 1-10, how CONFIDENT are you that you can quit smoking: 5 Recommended chantix --called in rx to local pharmacy; $98.47-patient picked it up   Patient Goals/Self-Care Activities patient will:  - take medications as prescribed as evidenced by patient report and record review collaborate with provider on medication access solutions   Subjective: Tammie Martin is an 71 y.o. year old female who is a primary patient of Janora Norlander, DO.  The CCM team was consulted for assistance with disease management and care coordination needs.    Engaged with patient by telephone for follow up visit in response to provider referral for pharmacy case management and/or care coordination services.   Consent to Services:  The patient was given information about Chronic Care Management services, agreed to services, and gave verbal consent prior to initiation of services.  Please see initial visit note for detailed documentation.   Patient Care  Team: Janora Norlander, DO as PCP - General (Family Medicine) Arnoldo Lenis, MD as PCP - Cardiology (Cardiology) Alric Ran, MD as Consulting Physician (Neurology) Lavera Guise, [facility] as Pharmacist (Family Medicine)  Objective:  Lab Results  Component Value Date   CREATININE 0.70 02/25/2022   CREATININE 0.67 02/11/2022   CREATININE 0.62 02/10/2022    Lab Results  Component Value Date   HGBA1C 5.6 02/04/2022   Last diabetic Eye exam: No results found for: "HMDIABEYEEXA"  Last diabetic Foot exam: No results found for: "HMDIABFOOTEX"      Component Value Date/Time   CHOL 133 02/05/2022 0941   CHOL 144 04/15/2021 1328   TRIG 89 02/05/2022 0941   HDL 36 (L) 02/05/2022 0941   HDL 41 04/15/2021 1328   CHOLHDL 3.7 02/05/2022 0941   VLDL 18 02/05/2022 0941   LDLCALC 79 02/05/2022 0941   LDLCALC 84 04/15/2021 1328       Latest Ref Rng & Units 03/14/2022   11:13 AM 02/25/2022    2:10 PM 02/10/2022    1:04 AM  Hepatic Function  Total Protein 6.0 - 8.5 g/dL 6.9  7.3  6.1   Albumin 3.8 - 4.8 g/dL 3.8  4.1  2.9   AST 0 - 40 IU/L 26  70  29   ALT 0 - 32 IU/L 27  67  34   Alk Phosphatase 44 - 121 IU/L 149  160  116   Total Bilirubin 0.0 - 1.2 mg/dL 0.3  0.3  0.7   Bilirubin, Direct 0.00 - 0.40 mg/dL 0.13       Lab Results  Component  Value Date/Time   TSH 6.060 (H) 06/10/2022 01:34 PM   TSH 0.399 02/05/2022 12:49 AM   TSH 1.810 08/24/2021 04:55 PM   FREET4 1.34 06/10/2022 01:34 PM   FREET4 1.54 08/24/2021 04:55 PM       Latest Ref Rng & Units 02/25/2022    2:10 PM 02/10/2022    1:04 AM 02/08/2022    2:01 AM  CBC  WBC 3.4 - 10.8 x10E3/uL 9.3  11.2  12.1   Hemoglobin 11.1 - 15.9 g/dL 14.6  13.4  13.4   Hematocrit 34.0 - 46.6 % 42.8  39.6  39.8   Platelets 150 - 450 x10E3/uL 182  175  142     Lab Results  Component Value Date/Time   VD25OH 76.8 06/10/2022 01:34 PM    Clinical ASCVD: Yes  The ASCVD Risk score (Arnett DK, et al., 2019) failed to calculate  for the following reasons:   The patient has a prior MI or stroke diagnosis    Other: (CHADS2VASc if Afib, PHQ9 if depression, MMRC or CAT for COPD, ACT, DEXA)  Social History   Tobacco Use  Smoking Status Every Day   Packs/day: 1.00   Years: 40.00   Total pack years: 40.00   Types: Cigarettes  Smokeless Tobacco Never   BP Readings from Last 3 Encounters:  06/10/22 121/82  04/25/22 120/75  03/21/22 117/89   Pulse Readings from Last 3 Encounters:  06/10/22 88  04/25/22 (!) 52  03/21/22 83   Wt Readings from Last 3 Encounters:  06/10/22 118 lb 9.6 oz (53.8 kg)  04/25/22 118 lb 9.6 oz (53.8 kg)  03/21/22 116 lb (52.6 kg)    Assessment: Review of patient past medical history, allergies, medications, health status, including review of consultants reports, laboratory and other test data, was performed as part of comprehensive evaluation and provision of chronic care management services.   SDOH:  (Social Determinants of Health) assessments and interventions performed:  SDOH Interventions    Flowsheet Row Office Visit from 06/16/2021 in [place] from 06/03/2021 in [place] Visit from 01/13/2021 in [place] Interventions     Food Insecurity Interventions -- Intervention Not Indicated --  Housing Interventions -- Intervention Not Indicated --  Transportation Interventions -- Intervention Not Indicated --  Depression Interventions/Treatment  Currently on Treatment Currently on Treatment Medication  Financial Strain Interventions -- Intervention Not Indicated --  Physical Activity Interventions -- Intervention Not Indicated --  Stress Interventions -- Intervention Not Indicated --  Social Connections Interventions -- Intervention Not Indicated --       CCM Care Plan  Allergies  Allergen Reactions   Penicillins Hives   Singulair [Montelukast Sodium]     headache    Codeine Rash   Nicoderm [Nicotine] Hives and Swelling   Other Rash    Band-aids cause rash after a few days    Medications Reviewed Today     Reviewed by Lavera Guise, [facility] (Pharmacist) on 06/21/22 at 1126  Med List Status: <None>   Medication Order Taking? Sig Documenting Provider Last Dose Status Informant  albuterol (VENTOLIN HFA) 108 (90 Base) MCG/ACT inhaler [id number] No Inhale 2 puffs into the lungs every 6 (six) hours as needed for wheezing or shortness of breath. Janora Norlander, DO Taking Active Pharmacy Records, Family Member           Med Note [facility], Earvin Hansen Feb 16, 2022  9:51 AM)    atorvastatin (LIPITOR) 40 MG tablet [id number] No Take 40 mg by mouth daily. [provider] Taking Active   azithromycin (ZITHROMAX) 500 MG tablet [id number] No Take 1 tablet (500 mg total) by mouth as directed. TAKE 500 MG  1 HOUR PRIOR TO ANY DENTAL  CLEANINGS AND PROCEDURES. Tommie Raymond, NP Taking Active   bisoprolol (ZEBETA) 10 MG tablet [id number] No Take 1.5 tablets (15 mg total) by mouth daily. Arnoldo Lenis, MD Taking Active Pharmacy Records, Family Member  CALCIUM-MAGNESIUM PO [id number] No Take 1 tablet by mouth daily. [provider] Taking Active Pharmacy Records, Family Member  cetirizine (ZYRTEC) 10 MG tablet [id number] No Take 10 mg by mouth daily. [provider] Taking Active Pharmacy Records, Family Member  Cholecalciferol (VITAMIN D) 50 MCG (2000 UT) tablet [id number] No Take 2,000 Units by mouth daily. [provider] Taking Active Pharmacy Records, Family Member  desvenlafaxine (PRISTIQ) 50 MG 24 hr tablet [id number] No TAKE ONE TABLET BY MOUTH DAILY Janora Norlander, DO Taking Active   ELIQUIS 5 MG TABS tablet [id number]  TAKE ONE TABLET BY MOUTH TWICE DAILY Ronnie Doss M, DO  Active   fluticasone (FLONASE) 50 MCG/ACT nasal spray [id number] No Place 2 sprays into both nostrils daily as needed for allergies or  rhinitis. Janora Norlander, DO Taking Active Pharmacy Records, Family Member  furosemide (LASIX) 20 MG tablet [id number]  TAKE ONE TABLET BY MOUTH DAILY Ronnie Doss M, DO  Active   hydrocortisone cream 1 % [id number] No Apply 1 application. topically 2 (two) times daily as needed (rash). [provider] Taking Active Pharmacy Records, Family Member  levETIRAcetam (KEPPRA) 750 MG tablet [id number] No Take 1 tablet (750 mg total) by mouth 2 (two) times daily. Alric Ran, MD Taking Active   levothyroxine (SYNTHROID) 88 MCG tablet [id number] No Take 1 tablet by mouth daily. Janora Norlander, DO Taking Active Pharmacy Records, Family Member  Multiple Vitamins-Minerals (ALIVE WOMENS 50+ PO) [id number] No Take 1 tablet by mouth daily. [provider] Taking Active Pharmacy Records, Family Member  Omega-3-6-9 CAPS [id number] No Take 1 capsule by mouth 2 (two) times daily. [provider] Taking Active Pharmacy Records, Family Member  Polyethyl Glycol-Propyl Glycol [facility] OP) [id number] No Place 1 drop into both eyes daily as needed (dry eyes). [provider] Taking Active Pharmacy Records, Family Member  potassium chloride SA (KLOR-CON M) 10 MEQ tablet [id number] No Take 1 tablet (10 mEq total) by mouth daily. Eileen Stanford, PA-C Taking Active Pharmacy Records, Family Member  Probiotic Product (PROBIOTIC ADVANCED PO) [id number] No Take 1 capsule by mouth daily. [provider] Taking Active Pharmacy Records, Family Member  traZODone (DESYREL) 50 MG tablet [id number] No Take 1 tablet (50 mg total) by mouth at bedtime as needed for sleep. Janora Norlander, DO Taking Active   TURMERIC PO [id number] No Take 1 capsule by mouth 2 (two) times daily. [provider] Taking Active Pharmacy Records, Family Member  Varenicline Tartrate, Starter, (CHANTIX STARTING MONTH PAK) 0.5 MG X 11 & 1 MG X 42 TBPK [id number]  Initial, 0.5 mg orally once daily for 3  days, then 0.5 mg twice daily on days 4 through 7, and then 1 mg twice daily thereafter for 12 weeks of treatment Janora Norlander, DO  Active             Patient Active Problem List   Diagnosis Date Noted   Lymph node enlargement 04/15/2022  Acute metabolic encephalopathy 59/53/9672   Acute respiratory failure with hypoxia ([place]) 02/07/2022   Depression 02/07/2022   A-fib ([place]) 02/07/2022   Seizure ([place]) 02/07/2022   Encephalopathy    CVA (cerebral vascular accident) ([place]) 02/04/2022   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   History of cervical cancer 02/01/2022   Severe aortic stenosis 01/17/2022   Tobacco abuse 01/17/2022   Osteopenia 03/11/2020   Olecranon bursitis of left elbow 11/26/2017   Essential hypertension, benign 11/20/2015   Hyperlipidemia LDL goal <130 11/20/2015   Hypothyroidism 11/20/2015   Generalized anxiety disorder 11/20/2015    Immunization History  Administered Date(s) Administered   Fluad Quad(high Dose 65+) 06/02/2020, 05/31/2021, 06/10/2022   Influenza, High Dose Seasonal PF 07/05/2017, 07/09/2018   Influenza,inj,Quad PF,6+ Mos 07/19/2016   PFIZER(Purple Top)SARS-COV-2 Vaccination 09/18/2019, 10/09/2019, 05/26/2020   Pneumococcal Conjugate-13 05/05/2016   Pneumococcal Polysaccharide-23 05/23/2013, 06/10/2020    Conditions to be addressed/monitored: COPD, DMII, and Tobacco Use  Care Plan : PHARMD MEDICATION MANAGEMENT  Updates made by Lavera Guise, [place] since 06/21/2022 12:00 AM     Problem: DISEASE PROGRESSION PREVENTION      Long-Range Goal: COPD, SMOKING CESSATION   Recent Progress: Not on track  Priority: High  Note:   Current Barriers:  Unable to independently afford treatment regimen Unable to maintain control of COPD Suboptimal therapeutic regimen for COPD  Pharmacist Clinical Goal(s):  patient will verbalize ability to afford treatment regimen maintain control of COPD as evidenced by IMPROVED  BREATHING, QUALITY OF LIFE  through collaboration with PharmD and provider.   Interventions: 1:1 collaboration with Janora Norlander, DO regarding development and update of comprehensive plan of care as evidenced by provider attestation and co-signature Inter-disciplinary care team collaboration (see longitudinal plan of care) Comprehensive medication review performed; medication list updated in electronic medical record  Chronic Obstructive Pulmonary Disease:  Goal on Track (progressing): YES. Uncontrolled; current treatment: Anoro (can't afford) --> will transition to [facility] (ease of patient assistance; patient needs more control) 2 puffs twice daily Current pack per day smoker with 40 pack yr history; recent stroke in July 2023 Interested in chantix -- worried about cost Most recent Pulmonary Function Testing: n/a 0 exacerbations requiring treatment in the last 6 months  Assessed patient finances. Enrolled in breztri patient assistance via az&me patient assistance program  Tobacco Abuse:  New goal. 1 packs per day; 40 years of use; does smoke within 30 minutes of waking up Previous quit attempts:  0 Triggers to smoke:  Motivation to quit smoking: recent stroke On a scale of 1-10, how IMPORTANT is it for you to quit smoking: 10  On a scale of 1-10, how CONFIDENT are you that you can quit smoking: 5 Recommended chantix --called in rx to local pharmacy; $98.47-patient picked it up   Patient Goals/Self-Care Activities patient will:  - take medications as prescribed as evidenced by patient report and record review collaborate with provider on medication access solutions      Medication Assistance:  breztri obtained through az&me  medication assistance program.  Enrollment ends 07/2022  Patient's preferred pharmacy is:  [facility] [place], [place] - [address] [place] [address] Phone: [phone] Fax: [facility] [address] [place] [address] Phone: [phone] Fax: [phone]  Follow Up:  Patient agrees to Care Plan and Follow-up.  Plan: Telephone follow up appointment with care management team member scheduled for:  1 month   Regina Eck, PharmD, BCPS Clinical Pharmacist, [place]  II Phone [phone]

## 2022-06-21 NOTE — Patient Instructions (Signed)
Visit Information  Following are the goals we discussed today:  Current Barriers:  Unable to independently afford treatment regimen Unable to maintain control of COPD Suboptimal therapeutic regimen for COPD  Pharmacist Clinical Goal(s):  patient will verbalize ability to afford treatment regimen maintain control of COPD as evidenced by IMPROVED BREATHING, QUALITY OF LIFE  through collaboration with PharmD and provider.   Interventions: 1:1 collaboration with Janora Norlander, DO regarding development and update of comprehensive plan of care as evidenced by provider attestation and co-signature Inter-disciplinary care team collaboration (see longitudinal plan of care) Comprehensive medication review performed; medication list updated in electronic medical record  Chronic Obstructive Pulmonary Disease:  Goal on Track (progressing): YES. Uncontrolled; current treatment: Anoro (can't afford) --> will transition to [facility] (ease of patient assistance; patient needs more control) 2 puffs twice daily Current pack per day smoker with 40 pack yr history; recent stroke in July 2023 Interested in chantix -- worried about cost Most recent Pulmonary Function Testing: n/a 0 exacerbations requiring treatment in the last 6 months  Assessed patient finances. Enrolled in breztri patient assistance via az&me patient assistance program  Tobacco Abuse:  New goal. 1 packs per day; 40 years of use; does smoke within 30 minutes of waking up Previous quit attempts:  0 Triggers to smoke:  Motivation to quit smoking: recent stroke On a scale of 1-10, how IMPORTANT is it for you to quit smoking: 10  On a scale of 1-10, how CONFIDENT are you that you can quit smoking: 5 Recommended chantix --called in rx to local pharmacy; $98.47-patient picked it up   Patient Goals/Self-Care Activities patient will:  - take medications as prescribed as evidenced by patient report and record review collaborate with  provider on medication access solutions   Plan: Telephone follow up appointment with care management team member scheduled for:  1 month  Signature Tammie Martin, PharmD, BCPS Clinical Pharmacist, [place]  II Phone [phone]   Please call the care guide team at [phone] if you need to cancel or reschedule your appointment.   The patient verbalized understanding of instructions, educational materials, and care plan provided today and DECLINED offer to receive copy of patient instructions, educational materials, and care plan.

## 2022-06-28 ENCOUNTER — Other Ambulatory Visit: Payer: Self-pay | Admitting: Family Medicine

## 2022-06-28 DIAGNOSIS — I639 Cerebral infarction, unspecified: Secondary | ICD-10-CM

## 2022-06-28 DIAGNOSIS — E785 Hyperlipidemia, unspecified: Secondary | ICD-10-CM

## 2022-06-30 ENCOUNTER — Ambulatory Visit (INDEPENDENT_AMBULATORY_CARE_PROVIDER_SITE_OTHER): Payer: Medicare HMO | Admitting: Pharmacist

## 2022-06-30 DIAGNOSIS — Z720 Tobacco use: Secondary | ICD-10-CM

## 2022-06-30 DIAGNOSIS — I4819 Other persistent atrial fibrillation: Secondary | ICD-10-CM

## 2022-06-30 DIAGNOSIS — E785 Hyperlipidemia, unspecified: Secondary | ICD-10-CM

## 2022-07-06 ENCOUNTER — Encounter: Payer: Self-pay | Admitting: Family Medicine

## 2022-07-12 NOTE — Patient Instructions (Signed)
Visit Information  Following are the goals we discussed today:  Current Barriers:  Unable to independently afford treatment regimen Unable to maintain control of COPD Suboptimal therapeutic regimen for COPD  Pharmacist Clinical Goal(s):  patient will verbalize ability to afford treatment regimen maintain control of COPD as evidenced by IMPROVED BREATHING, QUALITY OF LIFE  through collaboration with PharmD and provider.   Interventions: 1:1 collaboration with Janora Norlander, DO regarding development and update of comprehensive plan of care as evidenced by provider attestation and co-signature Inter-disciplinary care team collaboration (see longitudinal plan of care) Comprehensive medication review performed; medication list updated in electronic medical record  Chronic Obstructive Pulmonary Disease:  Goal on Track (progressing): YES. Uncontrolled; current treatment: Breztri (notices improvement in breathing since she was on anoro)-- continue 2 puffs twice daily Current pack per day smoker with 40 pack yr history; recent stroke in July 2023 Interested in chantix -- started chantix and is on her 1st week; will f/u up in 1 month for continued titration Most recent Pulmonary Function Testing: n/a 0 exacerbations requiring treatment in the last 6 months  Assessed patient finances. Enrolled in [place] patient assistance via AZ&me patient assistance program  Tobacco Abuse:  Goal on Track (progressing): YES. 1 packs per day; 40 years of use; does smoke within 30 minutes of waking up Previous quit attempts:  0 Triggers to smoke:  Motivation to quit smoking: recent stroke On a scale of 1-10, how IMPORTANT is it for you to quit smoking: 10  On a scale of 1-10, how CONFIDENT are you that you can quit smoking: 5 Recommended chantix --called in rx to local pharmacy; $98.47-patient picked it up   Patient Goals/Self-Care Activities patient will:  - take medications as prescribed as evidenced  by patient report and record review collaborate with provider on medication access solutions   Plan: Telephone follow up appointment with care management team member scheduled for:  1 month  Signature Regina Eck, PharmD, BCPS Clinical Pharmacist, [place]  II Phone [phone]   Please call the care guide team at [phone] if you need to cancel or reschedule your appointment.   Patient verbalizes understanding of instructions and care plan provided today and agrees to view in [place]. Active MyChart status and patient understanding of how to access instructions and care plan via MyChart confirmed with patient.

## 2022-07-12 NOTE — Progress Notes (Signed)
Chronic Care Management Pharmacy Note  06/30/2022 Name:  Tammie Martin MRN:  [id number] DOB:  01/28/1951  Summary:  Chronic Obstructive Pulmonary Disease:  Goal on Track (progressing): YES. Uncontrolled; current treatment: Breztri (notices improvement in breathing since she was on anoro)-- continue 2 puffs twice daily Current pack per day smoker with 40 pack yr history; recent stroke in July 2023 Interested in chantix -- started chantix and is on her 1st week; will f/u up in 1 month for continued titration Most recent Pulmonary Function Testing: n/a 0 exacerbations requiring treatment in the last 6 months  Assessed patient finances. [place] in [place]  Tobacco Abuse:  Goal on Track (progressing): YES. 1 packs per day; 40 years of use; does smoke within 30 minutes of waking up Previous quit attempts:  0 Triggers to smoke:  Motivation to quit smoking: recent stroke On a scale of 1-10, how IMPORTANT is it for you to quit smoking: 10  On a scale of 1-10, how CONFIDENT are you that you can quit smoking: 5 Recommended chantix --called in rx to local pharmacy; $98.47-patient picked it up  Subjective: Tammie Martin is an 71 y.o. year old female who is a primary patient of Janora Norlander, DO.  The patient was referred to the Chronic Care Management team for assistance with care management needs subsequent to provider initiation of CCM services and plan of care.    Engaged with patient by telephone for follow up visit in response to provider referral for CCM services.   Objective:  LABS:    Lab Results  Component Value Date   CREATININE 0.70 02/25/2022   CREATININE 0.67 02/11/2022   CREATININE 0.62 02/10/2022     Lab Results  Component Value Date   HGBA1C 5.6 02/04/2022         Component Value Date/Time   CHOL 133 02/05/2022 0941   CHOL 144 04/15/2021 1328   TRIG 89 02/05/2022 0941   HDL 36 (L) 02/05/2022  0941   HDL 41 04/15/2021 1328   CHOLHDL 3.7 02/05/2022 0941   VLDL 18 02/05/2022 0941   LDLCALC 79 02/05/2022 0941   LDLCALC 84 04/15/2021 1328     Clinical ASCVD: Yes   The ASCVD Risk score (Arnett DK, et al., 2019) failed to calculate for the following reasons:   The patient has a prior MI or stroke diagnosis    Other: (CHADS2VASc if Afib, PHQ9 if depression, MMRC or CAT for COPD, ACT, DEXA)    BP Readings from Last 3 Encounters:  06/10/22 121/82  04/25/22 120/75  03/21/22 117/89      SDOH:  (Social Determinants of Health) assessments and interventions performed:    Allergies  Allergen Reactions   Calcium Channel Blockers    Penicillins Hives   Singulair [Montelukast Sodium]     headache   Codeine Rash   Nicoderm [Nicotine] Hives and Swelling   Other Rash    Band-aids cause rash after a few days    Medications Reviewed Today     Reviewed by Lavera Guise, [facility] (Pharmacist) on 06/21/22 at 1126  Med List Status: <None>   Medication Order Taking? Sig Documenting Provider Last Dose Status Informant  albuterol (VENTOLIN HFA) 108 (90 Base) MCG/ACT inhaler [id number] No Inhale 2 puffs into the lungs every 6 (six) hours as needed for wheezing or shortness of breath. Janora Norlander, DO Taking Active Pharmacy Records, Family Member  Med Note [facility], CARLOS A   Wed Feb 16, 2022  9:51 AM)    atorvastatin (LIPITOR) 40 MG tablet [id number] No Take 40 mg by mouth daily. [provider] Taking Active   azithromycin (ZITHROMAX) 500 MG tablet [id number] No Take 1 tablet (500 mg total) by mouth as directed. TAKE 500 MG  1 HOUR PRIOR TO ANY DENTAL  CLEANINGS AND PROCEDURES. Tommie Raymond, NP Taking Active   bisoprolol (ZEBETA) 10 MG tablet [id number] No Take 1.5 tablets (15 mg total) by mouth daily. Arnoldo Lenis, MD Taking Active Pharmacy Records, Family Member  CALCIUM-MAGNESIUM PO [id number] No Take 1 tablet by mouth daily. [provider]  Taking Active Pharmacy Records, Family Member  cetirizine (ZYRTEC) 10 MG tablet [id number] No Take 10 mg by mouth daily. [provider] Taking Active Pharmacy Records, Family Member  Cholecalciferol (VITAMIN D) 50 MCG (2000 UT) tablet [id number] No Take 2,000 Units by mouth daily. [provider] Taking Active Pharmacy Records, Family Member  desvenlafaxine (PRISTIQ) 50 MG 24 hr tablet [id number] No TAKE ONE TABLET BY MOUTH DAILY Janora Norlander, DO Taking Active   ELIQUIS 5 MG TABS tablet [id number]  TAKE ONE TABLET BY MOUTH TWICE DAILY Ronnie Doss M, DO  Active   fluticasone (FLONASE) 50 MCG/ACT nasal spray [id number] No Place 2 sprays into both nostrils daily as needed for allergies or rhinitis. Janora Norlander, DO Taking Active Pharmacy Records, Family Member  furosemide (LASIX) 20 MG tablet [id number]  TAKE ONE TABLET BY MOUTH DAILY Ronnie Doss M, DO  Active   hydrocortisone cream 1 % [id number] No Apply 1 application. topically 2 (two) times daily as needed (rash). [provider] Taking Active Pharmacy Records, Family Member  levETIRAcetam (KEPPRA) 750 MG tablet [id number] No Take 1 tablet (750 mg total) by mouth 2 (two) times daily. Alric Ran, MD Taking Active   levothyroxine (SYNTHROID) 88 MCG tablet [id number] No Take 1 tablet by mouth daily. Janora Norlander, DO Taking Active Pharmacy Records, Family Member  Multiple Vitamins-Minerals (ALIVE WOMENS 50+ PO) [id number] No Take 1 tablet by mouth daily. [provider] Taking Active Pharmacy Records, Family Member  Omega-3-6-9 CAPS [id number] No Take 1 capsule by mouth 2 (two) times daily. [provider] Taking Active Pharmacy Records, Family Member  Polyethyl Glycol-Propyl Glycol [facility] OP) [id number] No Place 1 drop into both eyes daily as needed (dry eyes). [provider] Taking Active Pharmacy Records, Family Member  potassium chloride SA (KLOR-CON M) 10 MEQ tablet  [id number] No Take 1 tablet (10 mEq total) by mouth daily. Eileen Stanford, PA-C Taking Active Pharmacy Records, Family Member  Probiotic Product (PROBIOTIC ADVANCED PO) [id number] No Take 1 capsule by mouth daily. [provider] Taking Active Pharmacy Records, Family Member  traZODone (DESYREL) 50 MG tablet [id number] No Take 1 tablet (50 mg total) by mouth at bedtime as needed for sleep. Janora Norlander, DO Taking Active   TURMERIC PO [id number] No Take 1 capsule by mouth 2 (two) times daily. [provider] Taking Active Pharmacy Records, Family Member  Varenicline Tartrate, Starter, (CHANTIX STARTING MONTH PAK) 0.5 MG X 11 & 1 MG X 42 TBPK [id number]  Initial, 0.5 mg orally once daily for 3 days, then 0.5 mg twice daily on days 4 through 7, and then 1 mg twice daily thereafter for 12 weeks of treatment Gottschalk, Ashly M, DO  Active  Goals Addressed               This Visit's Progress     Patient Stated     COPD, SMOKING CESSATION (pt-stated)        Current Barriers:  Unable to independently afford treatment regimen Unable to maintain control of COPD Suboptimal therapeutic regimen for COPD  Pharmacist Clinical Goal(s):  patient will verbalize ability to afford treatment regimen maintain control of COPD as evidenced by IMPROVED BREATHING, QUALITY OF LIFE  through collaboration with PharmD and provider.   Interventions: 1:1 collaboration with Janora Norlander, DO regarding development and update of comprehensive plan of care as evidenced by provider attestation and co-signature Inter-disciplinary care team collaboration (see longitudinal plan of care) Comprehensive medication review performed; medication list updated in electronic medical record  Chronic Obstructive Pulmonary Disease:  Goal on Track (progressing): YES. Uncontrolled; current treatment: Breztri (notices improvement in breathing since she was on anoro)-- continue 2 puffs twice  daily Current pack per day smoker with 40 pack yr history; recent stroke in July 2023 Interested in chantix -- started chantix and is on her 1st week; will f/u up in 1 month for continued titration Most recent Pulmonary Function Testing: n/a 0 exacerbations requiring treatment in the last 6 months  Assessed patient finances. Enrolled in [place]  Tobacco Abuse:  Goal on Track (progressing): YES. 1 packs per day; 40 years of use; does smoke within 30 minutes of waking up Previous quit attempts:  0 Triggers to smoke:  Motivation to quit smoking: recent stroke On a scale of 1-10, how IMPORTANT is it for you to quit smoking: 10  On a scale of 1-10, how CONFIDENT are you that you can quit smoking: 5 Recommended chantix --called in rx to local pharmacy; $98.47-patient picked it up   Patient Goals/Self-Care Activities patient will:  - take medications as prescribed as evidenced by patient report and record review collaborate with provider on medication access solutions         Plan: Telephone follow up appointment with care management team member scheduled for:  1 month      Regina Eck, PharmD, BCPS Clinical Pharmacist, [place]  II Phone [phone]

## 2022-07-13 ENCOUNTER — Ambulatory Visit: Payer: Medicare HMO | Admitting: Pharmacist

## 2022-07-13 DIAGNOSIS — E785 Hyperlipidemia, unspecified: Secondary | ICD-10-CM

## 2022-07-13 DIAGNOSIS — I639 Cerebral infarction, unspecified: Secondary | ICD-10-CM

## 2022-07-15 ENCOUNTER — Encounter: Payer: Self-pay | Admitting: Pharmacist

## 2022-07-15 ENCOUNTER — Telehealth: Payer: Self-pay | Admitting: Family Medicine

## 2022-07-19 NOTE — Progress Notes (Signed)
Chronic Care Management Pharmacy Note  07/13/2022 Name:  LEEN TWOREK MRN:  [id number] DOB:  16-Jun-1951  Summary: Chronic Obstructive Pulmonary Disease:  Goal on Track (progressing): YES. Uncontrolled/improving; current treatment: Breztri (notices improvement in breathing since she was on anoro)-- continue 2 puffs twice daily Current pack per day smoker with 40 pack yr history; recent stroke in July 2023 Most recent Pulmonary Function Testing: n/a 0 exacerbations requiring treatment in the last 6 months  Assessed patient finances. Enrolled in [place]  Tobacco Abuse:  Goal on track: NO. 1 packs per day; 40 years of use; does smoke within 30 minutes of waking up Previous quit attempts:  1 Chantix x2 weeks--reports severe abdominal pain/GI distress when she increased her dose Patient has stopped medication We can explore nicotine replacement and [place] quit line counseling Triggers to smoke:  Motivation to quit smoking: recent stroke On a scale of 1-10, how IMPORTANT is it for you to quit smoking: 10  On a scale of 1-10, how CONFIDENT are you that you can quit smoking: 5 Recommended [place] quit line;nicotine replacement   Patient Goals/Self-Care Activities patient will:  - take medications as prescribed as evidenced by patient report and record review collaborate with provider on medication access solutions   Subjective: Tammie Martin is an 71 y.o. year old female who is a primary patient of Janora Norlander, DO.  The patient was referred to the Chronic Care Management team for assistance with care management needs subsequent to provider initiation of CCM services and plan of care.    Engaged with patient by telephone for follow up visit in response to provider referral for CCM services.   Objective:  LABS:    Lab Results  Component Value Date   CREATININE 0.70 02/25/2022   CREATININE 0.67 02/11/2022   CREATININE  0.62 02/10/2022     Lab Results  Component Value Date   HGBA1C 5.6 02/04/2022         Component Value Date/Time   CHOL 133 02/05/2022 0941   CHOL 144 04/15/2021 1328   TRIG 89 02/05/2022 0941   HDL 36 (L) 02/05/2022 0941   HDL 41 04/15/2021 1328   CHOLHDL 3.7 02/05/2022 0941   VLDL 18 02/05/2022 0941   LDLCALC 79 02/05/2022 0941   LDLCALC 84 04/15/2021 1328     Clinical ASCVD: Yes   The ASCVD Risk score (Arnett DK, et al., 2019) failed to calculate for the following reasons:   The patient has a prior MI or stroke diagnosis    Other: (CHADS2VASc if Afib, PHQ9 if depression, MMRC or CAT for COPD, ACT, DEXA)    BP Readings from Last 3 Encounters:  06/10/22 121/82  04/25/22 120/75  03/21/22 117/89      SDOH:  (Social Determinants of Health) assessments and interventions performed:    Allergies  Allergen Reactions   Calcium Channel Blockers    Penicillins Hives   Singulair [Montelukast Sodium]     headache   Chantix [Varenicline] Nausea Only    GI intolerance with higher doses   Codeine Rash   Nicoderm [Nicotine] Hives and Swelling   Other Rash    Band-aids cause rash after a few days    Medications Reviewed Today     Reviewed by Lavera Guise, [facility] (Pharmacist) on 06/21/22 at 1126  Med List Status: <None>   Medication Order Taking? Sig Documenting Provider Last Dose Status Informant  albuterol (VENTOLIN HFA) 108 (90  Base) MCG/ACT inhaler [id number] No Inhale 2 puffs into the lungs every 6 (six) hours as needed for wheezing or shortness of breath. Janora Norlander, DO Taking Active Pharmacy Records, Family Member           Med Note [facility], [place]   Wed Feb 16, 2022  9:51 AM)    atorvastatin (LIPITOR) 40 MG tablet [id number] No Take 40 mg by mouth daily. [provider] Taking Active   azithromycin (ZITHROMAX) 500 MG tablet [id number] No Take 1 tablet (500 mg total) by mouth as directed. TAKE 500 MG  1 HOUR PRIOR TO ANY DENTAL  CLEANINGS AND  PROCEDURES. Tommie Raymond, NP Taking Active   bisoprolol (ZEBETA) 10 MG tablet [id number] No Take 1.5 tablets (15 mg total) by mouth daily. Arnoldo Lenis, MD Taking Active Pharmacy Records, Family Member  CALCIUM-MAGNESIUM PO [id number] No Take 1 tablet by mouth daily. [provider] Taking Active Pharmacy Records, Family Member  cetirizine (ZYRTEC) 10 MG tablet [id number] No Take 10 mg by mouth daily. [provider] Taking Active Pharmacy Records, Family Member  Cholecalciferol (VITAMIN D) 50 MCG (2000 UT) tablet [id number] No Take 2,000 Units by mouth daily. [provider] Taking Active Pharmacy Records, Family Member  desvenlafaxine (PRISTIQ) 50 MG 24 hr tablet [id number] No TAKE ONE TABLET BY MOUTH DAILY Janora Norlander, DO Taking Active   ELIQUIS 5 MG TABS tablet [id number]  TAKE ONE TABLET BY MOUTH TWICE DAILY Ronnie Doss M, DO  Active   fluticasone (FLONASE) 50 MCG/ACT nasal spray [id number] No Place 2 sprays into both nostrils daily as needed for allergies or rhinitis. Janora Norlander, DO Taking Active Pharmacy Records, Family Member  furosemide (LASIX) 20 MG tablet [id number]  TAKE ONE TABLET BY MOUTH DAILY Ronnie Doss M, DO  Active   hydrocortisone cream 1 % [id number] No Apply 1 application. topically 2 (two) times daily as needed (rash). [provider] Taking Active Pharmacy Records, Family Member  levETIRAcetam (KEPPRA) 750 MG tablet [id number] No Take 1 tablet (750 mg total) by mouth 2 (two) times daily. Alric Ran, MD Taking Active   levothyroxine (SYNTHROID) 88 MCG tablet [id number] No Take 1 tablet by mouth daily. Janora Norlander, DO Taking Active Pharmacy Records, Family Member  Multiple Vitamins-Minerals (ALIVE WOMENS 50+ PO) [id number] No Take 1 tablet by mouth daily. [provider] Taking Active Pharmacy Records, Family Member  Omega-3-6-9 CAPS [id number] No Take 1 capsule by mouth 2 (two) times daily. [provider] Taking Active Pharmacy Records, Family Member  Polyethyl Glycol-Propyl Glycol [facility] OP) [id number] No Place 1 drop into both eyes daily as needed (dry eyes). [provider] Taking Active Pharmacy Records, Family Member  potassium chloride SA (KLOR-CON M) 10 MEQ tablet [id number] No Take 1 tablet (10 mEq total) by mouth daily. Eileen Stanford, PA-C Taking Active Pharmacy Records, Family Member  Probiotic Product (PROBIOTIC ADVANCED PO) [id number] No Take 1 capsule by mouth daily. [provider] Taking Active Pharmacy Records, Family Member  traZODone (DESYREL) 50 MG tablet [id number] No Take 1 tablet (50 mg total) by mouth at bedtime as needed for sleep. Janora Norlander, DO Taking Active   TURMERIC PO [id number] No Take 1 capsule by mouth 2 (two) times daily. [provider] Taking Active Pharmacy Records, Family Member  Varenicline Tartrate, Starter, (CHANTIX STARTING MONTH PAK) 0.5 MG X 11 & 1 MG X 42 TBPK [id number]  Initial, 0.5 mg orally once daily for 3  days, then 0.5 mg twice daily on days 4 through 7, and then 1 mg twice daily thereafter for 12 weeks of treatment Gottschalk, Ashly M, DO  Active               Goals Addressed               This Visit's Progress     Patient Stated     COPD, SMOKING CESSATION (pt-stated)        Current Barriers:  Unable to independently afford treatment regimen Unable to maintain control of COPD Suboptimal therapeutic regimen for COPD  Pharmacist Clinical Goal(s):  patient will verbalize ability to afford treatment regimen maintain control of COPD as evidenced by IMPROVED BREATHING, QUALITY OF LIFE  through collaboration with PharmD and provider.   Interventions: 1:1 collaboration with Janora Norlander, DO regarding development and update of comprehensive plan of care as evidenced by provider attestation and co-signature Inter-disciplinary care team collaboration (see longitudinal plan of  care) Comprehensive medication review performed; medication list updated in electronic medical record  Chronic Obstructive Pulmonary Disease:  Goal on Track (progressing): YES. Uncontrolled/improving; current treatment: Breztri (notices improvement in breathing since she was on anoro)-- continue 2 puffs twice daily Current pack per day smoker with 40 pack yr history; recent stroke in July 2023 Most recent Pulmonary Function Testing: n/a 0 exacerbations requiring treatment in the last 6 months  Assessed patient finances. [place]  Tobacco Abuse:  Goal on track: NO. 1 packs per day; 40 years of use; does smoke within 30 minutes of waking up Previous quit attempts:  1 Chantix x2 weeks--reports severe abdominal pain/GI distress when she increased her dose Patient has stopped medication We can explore nicotine replacement and [place] quit line counseling Triggers to smoke:  Motivation to quit smoking: recent stroke On a scale of 1-10, how IMPORTANT is it for you to quit smoking: 10  On a scale of 1-10, how CONFIDENT are you that you can quit smoking: 5 Recommended [place] quit line;nicotine replacement   Patient Goals/Self-Care Activities patient will:  - take medications as prescribed as evidenced by patient report and record review collaborate with provider on medication access solutions         Plan: Telephone follow up appointment with care management team member scheduled for:  3 months    Regina Eck, PharmD, BCPS Clinical Pharmacist, [place]  II Phone [phone]

## 2022-07-19 NOTE — Patient Instructions (Signed)
Visit Information  Following are the goals we discussed today:  (Copy and paste patient goals from clinical care plan here)  Plan: Telephone follow up appointment with care management team member scheduled for:  3-6 months  Signature Regina Eck, PharmD, BCPS Clinical Pharmacist, [place]  II Phone [phone]   Please call the care guide team at [phone] if you need to cancel or reschedule your appointment.   The patient verbalized understanding of instructions, educational materials, and care plan provided today and DECLINED offer to receive copy of patient instructions, educational materials, and care plan.

## 2022-07-28 DIAGNOSIS — E785 Hyperlipidemia, unspecified: Secondary | ICD-10-CM

## 2022-07-28 DIAGNOSIS — I4819 Other persistent atrial fibrillation: Secondary | ICD-10-CM

## 2022-07-28 DIAGNOSIS — I639 Cerebral infarction, unspecified: Secondary | ICD-10-CM

## 2022-07-29 ENCOUNTER — Other Ambulatory Visit: Payer: Self-pay | Admitting: Family Medicine

## 2022-07-29 DIAGNOSIS — I509 Heart failure, unspecified: Secondary | ICD-10-CM

## 2022-08-02 ENCOUNTER — Institutional Professional Consult (permissible substitution): Payer: Medicare HMO | Admitting: Internal Medicine

## 2022-08-08 ENCOUNTER — Other Ambulatory Visit: Payer: Self-pay | Admitting: Physician Assistant

## 2022-08-08 DIAGNOSIS — Z952 Presence of prosthetic heart valve: Secondary | ICD-10-CM

## 2022-08-11 ENCOUNTER — Other Ambulatory Visit: Payer: Self-pay | Admitting: *Deleted

## 2022-08-11 DIAGNOSIS — E034 Atrophy of thyroid (acquired): Secondary | ICD-10-CM

## 2022-08-15 ENCOUNTER — Other Ambulatory Visit: Payer: Medicare HMO

## 2022-08-15 ENCOUNTER — Other Ambulatory Visit: Payer: Self-pay

## 2022-08-15 DIAGNOSIS — E034 Atrophy of thyroid (acquired): Secondary | ICD-10-CM

## 2022-08-16 LAB — TSH: TSH: 3.49 u[IU]/mL (ref 0.450–4.500)

## 2022-08-16 LAB — T4, FREE: Free T4: 1.68 ng/dL (ref 0.82–1.77)

## 2022-08-17 ENCOUNTER — Ambulatory Visit (INDEPENDENT_AMBULATORY_CARE_PROVIDER_SITE_OTHER): Payer: Medicare HMO

## 2022-08-17 VITALS — Ht 62.5 in | Wt 118.0 lb

## 2022-08-17 DIAGNOSIS — Z0000 Encounter for general adult medical examination without abnormal findings: Secondary | ICD-10-CM

## 2022-08-17 NOTE — Patient Instructions (Signed)
Tammie Martin , Thank you for taking time to come for your Medicare Wellness Visit. I appreciate your ongoing commitment to your health goals. Please review the following plan we discussed and let me know if I can assist you in the future.   These are the goals we discussed:  Goals       COPD, SMOKING CESSATION (pt-stated)      Current Barriers:  Unable to independently afford treatment regimen Unable to maintain control of COPD Suboptimal therapeutic regimen for COPD  Pharmacist Clinical Goal(s):  patient will verbalize ability to afford treatment regimen maintain control of COPD as evidenced by IMPROVED BREATHING, QUALITY OF LIFE  through collaboration with PharmD and provider.   Interventions: 1:1 collaboration with Janora Norlander, DO regarding development and update of comprehensive plan of care as evidenced by provider attestation and co-signature Inter-disciplinary care team collaboration (see longitudinal plan of care) Comprehensive medication review performed; medication list updated in electronic medical record  Chronic Obstructive Pulmonary Disease:  Goal on Track (progressing): YES. Uncontrolled/improving; current treatment: Breztri (notices improvement in breathing since she was on anoro)-- continue 2 puffs twice daily Current pack per day smoker with 40 pack yr history; recent stroke in July 2023 Most recent Pulmonary Function Testing: n/a 0 exacerbations requiring treatment in the last 6 months  Assessed patient finances. Enrolled in [employer] [place] patient assistance [place]  Tobacco Abuse:  Goal on track: NO. 1 packs per day; 40 years of use; does smoke within 30 minutes of waking up Previous quit attempts:  1 Chantix x2 weeks--reports severe abdominal pain/GI distress when she increased her dose Patient has stopped medication We can explore nicotine replacement and [place] quit line counseling Triggers to smoke:  Motivation to quit smoking:  recent stroke On a scale of 1-10, how IMPORTANT is it for you to quit smoking: 10  On a scale of 1-10, how CONFIDENT are you that you can quit smoking: 5 Recommended [place] quit line;nicotine replacement   Patient Goals/Self-Care Activities patient will:  - take medications as prescribed as evidenced by patient report and record review collaborate with provider on medication access solutions       Exercise 150 min/wk Moderate Activity        This is a list of the screening recommended for you and due dates:  Health Maintenance  Topic Date Due   DTaP/Tdap/Td vaccine (1 - Tdap) Never done   COVID-19 Vaccine (4 - 2023-24 season) 04/29/2022   Zoster (Shingles) Vaccine (1 of 2) 09/10/2022*   Mammogram  02/26/2023*   DEXA scan (bone density measurement)  06/11/2023*   Screening for Lung Cancer  04/14/2023   Medicare Annual Wellness Visit  08/18/2023   Cologuard (Stool DNA test)  06/09/2024   Pneumonia Vaccine  Completed   Flu Shot  Completed   Hepatitis C Screening: USPSTF Recommendation to screen - Ages 18-79 yo.  Completed   HPV Vaccine  Aged Out  *Topic was postponed. The date shown is not the original due date.    Advanced directives: Advance directive discussed with you today. I have provided a copy for you to complete at home and have notarized. Once this is complete please bring a copy in to our office so we can scan it into your chart.   Conditions/risks identified: Aim for 30 minutes of exercise or brisk walking, 6-8 glasses of water, and 5 servings of fruits and vegetables each day.   Next appointment: Follow up in one year for  your annual wellness visit    Preventive Care 65 Years and Older, Female Preventive care refers to lifestyle choices and visits with your health care provider that can promote health and wellness. What does preventive care include? A yearly physical exam. This is also called an annual well check. Dental exams once or twice a year. Routine eye  exams. Ask your health care provider how often you should have your eyes checked. Personal lifestyle choices, including: Daily care of your teeth and gums. Regular physical activity. Eating a healthy diet. Avoiding tobacco and drug use. Limiting alcohol use. Practicing safe sex. Taking low-dose aspirin every day. Taking vitamin and mineral supplements as recommended by your health care provider. What happens during an annual well check? The services and screenings done by your health care provider during your annual well check will depend on your age, overall health, lifestyle risk factors, and family history of disease. Counseling  Your health care provider may ask you questions about your: Alcohol use. Tobacco use. Drug use. Emotional well-being. Home and relationship well-being. Sexual activity. Eating habits. History of falls. Memory and ability to understand (cognition). Work and work Statistician. Reproductive health. Screening  You may have the following tests or measurements: Height, weight, and BMI. Blood pressure. Lipid and cholesterol levels. These may be checked every 5 years, or more frequently if you are over 68 years old. Skin check. Lung cancer screening. You may have this screening every year starting at age 31 if you have a 30-pack-year history of smoking and currently smoke or have quit within the past 15 years. Fecal occult blood test (FOBT) of the stool. You may have this test every year starting at age 13. Flexible sigmoidoscopy or colonoscopy. You may have a sigmoidoscopy every 5 years or a colonoscopy every 10 years starting at age 62. Hepatitis C blood test. Hepatitis B blood test. Sexually transmitted disease (STD) testing. Diabetes screening. This is done by checking your blood sugar (glucose) after you have not eaten for a while (fasting). You may have this done every 1-3 years. Bone density scan. This is done to screen for osteoporosis. You may have  this done starting at age 7. Mammogram. This may be done every 1-2 years. Talk to your health care provider about how often you should have regular mammograms. Talk with your health care provider about your test results, treatment options, and if necessary, the need for more tests. Vaccines  Your health care provider may recommend certain vaccines, such as: Influenza vaccine. This is recommended every year. Tetanus, diphtheria, and acellular pertussis (Tdap, Td) vaccine. You may need a Td booster every 10 years. Zoster vaccine. You may need this after age 36. Pneumococcal 13-valent conjugate (PCV13) vaccine. One dose is recommended after age 25. Pneumococcal polysaccharide (PPSV23) vaccine. One dose is recommended after age 52. Talk to your health care provider about which screenings and vaccines you need and how often you need them. This information is not intended to replace advice given to you by your health care provider. Make sure you discuss any questions you have with your health care provider. Document Released: 09/11/2015 Document Revised: 05/04/2016 Document Reviewed: 06/16/2015 Elsevier Interactive Patient Education  2017 [place] Prevention in the Home Falls can cause injuries. They can happen to people of all ages. There are many things you can do to make your home safe and to help prevent falls. What can I do on the outside of my home? Regularly fix the edges of walkways  and driveways and fix any cracks. Remove anything that might make you trip as you walk through a door, such as a raised step or threshold. Trim any bushes or trees on the path to your home. Use bright outdoor lighting. Clear any walking paths of anything that might make someone trip, such as rocks or tools. Regularly check to see if handrails are loose or broken. Make sure that both sides of any steps have handrails. Any raised decks and porches should have guardrails on the edges. Have any leaves,  snow, or ice cleared regularly. Use sand or salt on walking paths during winter. Clean up any spills in your garage right away. This includes oil or grease spills. What can I do in the bathroom? Use night lights. Install grab bars by the toilet and in the tub and shower. Do not use towel bars as grab bars. Use non-skid mats or decals in the tub or shower. If you need to sit down in the shower, use a plastic, non-slip stool. Keep the floor dry. Clean up any water that spills on the floor as soon as it happens. Remove soap buildup in the tub or shower regularly. Attach bath mats securely with double-sided non-slip rug tape. Do not have throw rugs and other things on the floor that can make you trip. What can I do in the bedroom? Use night lights. Make sure that you have a light by your bed that is easy to reach. Do not use any sheets or blankets that are too big for your bed. They should not hang down onto the floor. Have a firm chair that has side arms. You can use this for support while you get dressed. Do not have throw rugs and other things on the floor that can make you trip. What can I do in the kitchen? Clean up any spills right away. Avoid walking on wet floors. Keep items that you use a lot in easy-to-reach places. If you need to reach something above you, use a strong step stool that has a grab bar. Keep electrical cords out of the way. Do not use floor polish or wax that makes floors slippery. If you must use wax, use non-skid floor wax. Do not have throw rugs and other things on the floor that can make you trip. What can I do with my stairs? Do not leave any items on the stairs. Make sure that there are handrails on both sides of the stairs and use them. Fix handrails that are broken or loose. Make sure that handrails are as long as the stairways. Check any carpeting to make sure that it is firmly attached to the stairs. Fix any carpet that is loose or worn. Avoid having throw  rugs at the top or bottom of the stairs. If you do have throw rugs, attach them to the floor with carpet tape. Make sure that you have a light switch at the top of the stairs and the bottom of the stairs. If you do not have them, ask someone to add them for you. What else can I do to help prevent falls? Wear shoes that: Do not have high heels. Have rubber bottoms. Are comfortable and fit you well. Are closed at the toe. Do not wear sandals. If you use a stepladder: Make sure that it is fully opened. Do not climb a closed stepladder. Make sure that both sides of the stepladder are locked into place. Ask someone to hold it for you, if possible.  Clearly mark and make sure that you can see: Any grab bars or handrails. First and last steps. Where the edge of each step is. Use tools that help you move around (mobility aids) if they are needed. These include: Canes. Walkers. Scooters. Crutches. Turn on the lights when you go into a dark area. Replace any light bulbs as soon as they burn out. Set up your furniture so you have a clear path. Avoid moving your furniture around. If any of your floors are uneven, fix them. If there are any pets around you, be aware of where they are. Review your medicines with your doctor. Some medicines can make you feel dizzy. This can increase your chance of falling. Ask your doctor what other things that you can do to help prevent falls. This information is not intended to replace advice given to you by your health care provider. Make sure you discuss any questions you have with your health care provider. Document Released: 06/11/2009 Document Revised: 01/21/2016 Document Reviewed: 09/19/2014 Elsevier Interactive Patient Education  2017 [employer].

## 2022-08-17 NOTE — Progress Notes (Signed)
Subjective:   Tammie Martin is a 71 y.o. female who presents for Medicare Annual (Subsequent) preventive examination.  I connected with  [name] on 08/17/22 by a audio enabled telemedicine application and verified that I am speaking with the correct person using two identifiers.  Patient Location: Home  Provider Location: Home Office  I discussed the limitations of evaluation and management by telemedicine. The patient expressed understanding and agreed to proceed.  Review of Systems     Cardiac Risk Factors include: advanced age (>68mn, >>32women);smoking/ tobacco exposure;hypertension;dyslipidemia     Objective:    Today's Vitals   08/17/22 1553  Weight: 118 lb (53.5 kg)  Height: 5' 2.5" (1.588 m)   Body mass index is 21.24 kg/m.     08/17/2022    4:04 PM 02/21/2022   12:48 PM 02/07/2022   12:53 AM 02/04/2022   12:48 PM 01/28/2022   10:15 AM 11/22/2021    8:06 AM 10/27/2021    9:45 AM  Advanced Directives  Does Patient Have a Medical Advance Directive? No No No No No No No  Would patient like information on creating a medical advance directive? Yes (MAU/Ambulatory/Procedural Areas - Information given)   No - Patient declined No - Patient declined No - Patient declined No - Patient declined    Current Medications (verified) Outpatient Encounter Medications as of 08/17/2022  Medication Sig   albuterol (VENTOLIN HFA) 108 (90 Base) MCG/ACT inhaler Inhale 2 puffs into the lungs every 6 (six) hours as needed for wheezing or shortness of breath.   atorvastatin (LIPITOR) 40 MG tablet TAKE ONE TABLET BY MOUTH DAILY   azithromycin (ZITHROMAX) 500 MG tablet Take 1 tablet (500 mg total) by mouth as directed. TAKE 500 MG  1 HOUR PRIOR TO ANY DENTAL  CLEANINGS AND PROCEDURES.   bisoprolol (ZEBETA) 10 MG tablet Take 1.5 tablets (15 mg total) by mouth daily.   CALCIUM-MAGNESIUM PO Take 1 tablet by mouth daily.   cetirizine (ZYRTEC) 10 MG tablet Take 10 mg by mouth daily.    Cholecalciferol (VITAMIN D) 50 MCG (2000 UT) tablet Take 2,000 Units by mouth daily.   desvenlafaxine (PRISTIQ) 50 MG 24 hr tablet TAKE ONE TABLET BY MOUTH DAILY   ELIQUIS 5 MG TABS tablet TAKE ONE TABLET BY MOUTH TWICE DAILY   fluticasone (FLONASE) 50 MCG/ACT nasal spray Place 2 sprays into both nostrils daily as needed for allergies or rhinitis.   furosemide (LASIX) 20 MG tablet TAKE ONE TABLET BY MOUTH DAILY   hydrocortisone cream 1 % Apply 1 application. topically 2 (two) times daily as needed (rash).   levETIRAcetam (KEPPRA) 750 MG tablet Take 1 tablet (750 mg total) by mouth 2 (two) times daily.   levothyroxine (SYNTHROID) 88 MCG tablet Take 1 tablet by mouth daily.   Multiple Vitamins-Minerals (ALIVE WOMENS 50+ PO) Take 1 tablet by mouth daily.   Multiple Vitamins-Minerals (PRESERVISION AREDS PO) Take 1 capsule by mouth 2 (two) times daily.   Omega-3-6-9 CAPS Take 1 capsule by mouth 2 (two) times daily.   Polyethyl Glycol-Propyl Glycol (SYSTANE OP) Place 1 drop into both eyes daily as needed (dry eyes).   potassium chloride SA (KLOR-CON M) 10 MEQ tablet Take 1 tablet (10 mEq total) by mouth daily.   Probiotic Product (PROBIOTIC ADVANCED PO) Take 1 capsule by mouth daily.   traZODone (DESYREL) 50 MG tablet Take 1 tablet (50 mg total) by mouth at bedtime as needed for sleep.   TURMERIC PO Take 1  capsule by mouth 2 (two) times daily.   No facility-administered encounter medications on file as of 08/17/2022.    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 CBurt Knackand Dr. BCyndia Bent  Seizures ([facility]    Severe aortic stenosis    Stroke [place]    [place]    Thyroid disease    Tobacco abuse    Past Surgical History:  Procedure Laterality Date   AORTIC VALVE REPLACEMENT     APPENDECTOMY     pt recently had CT that said Appendix was there so she is unsure if this was actually removed during hysterectomy as she was previously told   BREAST ENHANCEMENT SURGERY     BREAST SURGERY     CARDIAC VALVE REPLACEMENT     CARDIOVERSION N/A 10/27/2021   Procedure: CARDIOVERSION;  Surgeon: BArnoldo Lenis MD;  Location: [facility];  Service: Endoscopy;  Laterality: N/A;   INTRAOPERATIVE TRANSESOPHAGEAL ECHOCARDIOGRAM N/A 02/01/2022   Procedure: INTRAOPERATIVE TRANSESOPHAGEAL ECHOCARDIOGRAM;  Surgeon: CSherren Mocha MD;  [place] [facility]  Service: Open Heart Surgery;  Laterality: N/A;   RIGHT/LEFT HEART CATH AND CORONARY ANGIOGRAPHY N/A 11/22/2021   Procedure: RIGHT/LEFT HEART CATH AND CORONARY ANGIOGRAPHY;  Surgeon: CSherren Mocha MD;  [facility];  Service: Cardiovascular;  Laterality: N/A;   TONSILLECTOMY     VAGINAL HYSTERECTOMY     Family History  Problem Relation Age of Onset   Cancer Mother        breast cancer at 392   Kidney disease Mother    Early death Mother    Cancer Maternal Aunt        colon cancer   Heart attack Father    Early death Father    Heart disease Father    Diabetes Sister    Obesity Sister    Depression Sister    Arthritis Sister    Alcohol abuse Brother    Cirrhosis Brother    Depression Brother    Liver disease Brother    Drug abuse Brother    Early death Brother    Diabetes Daughter    Fibromyalgia Daughter    Depression Daughter    Anxiety disorder Daughter    Arthritis Daughter     Depression Sister    Obesity Sister    Diabetes Sister    Early death Sister    Social History   Socioeconomic History   Marital status: Widowed    Spouse name: Not on file   Number of children: 2   Years of education: 14   Highest education level: Associate degree: academic program  Occupational History   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 Tobacco Use   Smoking status: Every Day    Packs/day: 1.00  Years: 40.00    Total pack years: 40.00    Types: Cigarettes   Smokeless tobacco: Never   Tobacco comments:    Cant afford medication to help with cessation  Vaping Use   Vaping Use: Never used  Substance and Sexual Activity   Alcohol use: Not Currently   Drug use: No   Sexual activity: Not Currently  Other Topics Concern   Not on file  Social History Narrative   Daughter currently living with her  one cat, one dog   Social Determinants of Health   Financial Resource Strain: Low Risk  (08/17/2022)   Overall Financial Resource Strain (CARDIA)    Difficulty of Paying Living Expenses: Not very hard  Food Insecurity: No Food Insecurity (08/17/2022)   Hunger Vital Sign    Worried About Running Out of Food in the Last Year: Never true    Ran Out of Food in the Last Year: Never true  Transportation Needs: No Transportation Needs (08/17/2022)   PRAPARE - Hydrologist (Medical): No    Lack of Transportation (Non-Medical): No  Physical Activity: Inactive (08/17/2022)   Exercise Vital Sign    Days of Exercise per Week: 0 days    Minutes of Exercise per Session: 0 min  Stress: No Stress Concern Present (08/17/2022)   [place]    Feeling of Stress : Not at all  Social Connections: Moderately Isolated (08/17/2022)   Social Connection and Isolation Panel [NHANES]    Frequency of Communication with Friends and Family: More than three times a week    Frequency of  Social Gatherings with Friends and Family: More than three times a week    Attends Religious Services: Never    Marine scientist or Organizations: Yes    Attends Archivist Meetings: 1 to 4 times per year    Marital Status: Widowed    Tobacco Counseling Ready to quit: Yes Counseling given: Not Answered Tobacco comments: Cant afford medication to help with cessation   Clinical Intake:  Pre-visit preparation completed: Yes  Pain : No/denies pain  Diabetes: No  How often do you need to have someone help you when you read instructions, pamphlets, or other written materials from your doctor or pharmacy?: 3 - Sometimes  Diabetic?No   Interpreter Needed?: No  Information entered by :: Denman George LPN   Activities of Daily Living    08/17/2022    4:05 PM 08/14/2022    5:25 PM  In your present state of health, do you have any difficulty performing the following activities:  Hearing? 0 0  Vision? 1 1  Difficulty concentrating or making decisions? 0 1  Walking or climbing stairs? 0 0  Dressing or bathing? 0 0  Doing errands, shopping? 1 1  Preparing Food and eating ? N Y  Using the Toilet? N N  In the past six months, have you accidently leaked urine? N N  Do you have problems with loss of bowel control? N N  Managing your Medications? N Y  Managing your Finances? N Y  Housekeeping or managing your Housekeeping? N Y    Patient Care Team: Janora Norlander, DO as PCP - General (Family Medicine) [name] Alphonse Guild, MD as PCP - Cardiology (Cardiology) Alric Ran, MD as Consulting Physician (Neurology) Lavera Guise, [facility] as Pharmacist (Family Medicine)  Indicate any recent Medical Services you may have received from other than [facility]  providers in the past year (date may be approximate).     Assessment:   This is a routine wellness examination for [place].  Hearing/Vision screen Hearing Screening - Comments:: Denies any difficulties   Vision  Screening - Comments:: Wears rx glasses - up to date with routine eye exams with Dr. 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 )    Dietary issues and exercise activities discussed: Current Exercise Habits: The patient does not participate in regular exercise at present   Goals Addressed   None   Depression Screen    08/17/2022    4:02 PM 06/10/2022    2:48 PM 06/10/2022    1:41 PM 02/25/2022    2:08 PM 11/01/2021    3:21 PM 09/16/2021   12:09 PM 08/24/2021    4:09 PM  PHQ 2/9 Scores  PHQ - 2 Score 3  0 '1 3 3 4  '$ PHQ- 9 Score '9   11 8 8 13  '$ Exception Documentation  Patient refusal         Fall Risk    08/17/2022    3:55 PM 08/14/2022    5:25 PM 06/10/2022    2:48 PM 06/10/2022    1:41 PM 02/28/2022    2:44 PM  [place] in the past year? 1 1 0 0 1  Number falls in past yr: 1 0  0 0  Injury with Fall? 0 '1  1 1  '$ Risk for fall due to : Impaired balance/gait;Impaired vision;Impaired mobility   No Fall Risks;History of fall(s) History of fall(s)  Follow up Falls evaluation completed;Education provided;Falls prevention discussed   Falls evaluation completed Education provided    FALL RISK PREVENTION PERTAINING TO THE HOME:  Any stairs in or around the home? Yes  If so, are there any without handrails? No  Home free of loose throw rugs in walkways, pet beds, electrical cords, etc? Yes  Adequate lighting in your home to reduce risk of falls? Yes   ASSISTIVE DEVICES UTILIZED TO PREVENT FALLS:  Life alert? No  Use of a cane, walker or w/c? No  Grab bars in the bathroom? Yes  Shower chair or bench in shower? No  Elevated toilet seat or a handicapped toilet? Yes   TIMED UP AND GO:  Was the test performed? No . Telephonic visit    Cognitive Function:    03/21/2018    3:50 PM  MMSE - Mini Mental State Exam  Orientation to time 5  Orientation to Place 5  Registration 3  Attention/ Calculation 5  Recall 3  Language- name 2 objects 2  Language- repeat  1  Language- follow 3 step command 3  Language- read & follow direction 1  Write a sentence 1  Copy design 1  Total score 30        08/17/2022    4:05 PM  6CIT Screen  What Year? 0 points  What month? 0 points  What time? 0 points  Count back from 20 0 points  Months in reverse 0 points  Repeat phrase 2 points  Total Score 2 points    Immunizations Immunization History  Administered Date(s) Administered   COVID-19, mRNA, vaccine(Comirnaty)12 years and older 08/15/2022   Fluad Quad(high Dose 65+) 06/02/2020, 05/31/2021, 06/10/2022   Influenza, High Dose Seasonal PF 07/05/2017, 07/09/2018   Influenza,inj,Quad PF,6+ Mos 07/19/2016   PFIZER(Purple Top)SARS-COV-2 Vaccination 09/18/2019, 10/09/2019, 05/26/2020   Pneumococcal Conjugate-13 05/05/2016   Pneumococcal Polysaccharide-23 05/23/2013, 06/10/2020  TDAP status: Due, Education has been provided regarding the importance of this vaccine. Advised may receive this vaccine at local pharmacy or Health Dept. Aware to provide a copy of the vaccination record if obtained from local pharmacy or Health Dept. Verbalized acceptance and understanding.  Flu Vaccine status: Up to date  Pneumococcal vaccine status: Up to date  Covid-19 vaccine status: Completed vaccines  Qualifies for Shingles Vaccine? Yes   Zostavax completed No   Shingrix Completed?: No.    Education has been provided regarding the importance of this vaccine. Patient has been advised to call insurance company to determine out of pocket expense if they have not yet received this vaccine. Advised may also receive vaccine at local pharmacy or Health Dept. Verbalized acceptance and understanding.  Screening Tests Health Maintenance  Topic Date Due   DTaP/Tdap/Td (1 - Tdap) Never done   Zoster Vaccines- Shingrix (1 of 2) 09/10/2022 (Originally 02/06/1970)   MAMMOGRAM  02/26/2023 (Originally 05/14/2014)   DEXA SCAN  06/11/2023 (Originally 06/02/2022)   COVID-19 Vaccine  (5 - 2023-24 season) 10/10/2022   Lung Cancer Screening  04/14/2023   Medicare Annual Wellness (AWV)  08/18/2023   Fecal DNA (Cologuard)  06/09/2024   Pneumonia Vaccine 84+ Years old  Completed   INFLUENZA VACCINE  Completed   Hepatitis C Screening  Completed   HPV VACCINES  Aged Out    Health Maintenance  Health Maintenance Due  Topic Date Due   DTaP/Tdap/Td (1 - Tdap) Never done    Colorectal cancer screening: Type of screening: Cologuard. Completed 06/09/21. Repeat every 3 years  Mammogram status: Ordered at future appointment; patient would like to postpone for now. Pt provided with contact info and advised to call to schedule appt.   Bone Density status: Completed 06/02/20. Results reflect: Bone density results: OSTEOPENIA. Repeat every 2 years.  Lung Cancer Screening: (Low Dose CT Chest recommended if Age 32-80 years, 30 pack-year currently smoking OR have quit w/in 15years.) does qualify.   Lung Cancer Screening Referral: completed 04/13/22  Additional Screening:  Hepatitis C Screening: does qualify; Completed 03/06/20  Vision Screening: Recommended annual ophthalmology exams for early detection of glaucoma and other disorders of the eye. Is the patient up to date with their annual eye exam?  Yes  Who is the provider or what is the name of the office in which the patient attends annual eye exams? Dr. Baird Cancer  If pt is not established with a provider, would they like to be referred to a provider to establish care? No .   Dental Screening: Recommended annual dental exams for proper oral hygiene  Community Resource Referral / Chronic Care Management: CRR required this visit?  No   CCM required this visit?   Currently receiving services      Plan:     I have personally reviewed and noted the following in the patient's chart:   Medical and social history Use of alcohol, tobacco or illicit drugs  Current medications and supplements including opioid prescriptions.  Patient is not currently taking opioid prescriptions. Functional ability and status Nutritional status Physical activity Advanced directives List of other physicians Hospitalizations, surgeries, and ER visits in previous 12 months Vitals Screenings to include cognitive, depression, and falls Referrals and appointments  In addition, I have reviewed and discussed with patient certain preventive protocols, quality metrics, and best practice recommendations. A written personalized care plan for preventive services as well as general preventive health recommendations were provided to patient.     Javier Glazier,  Matthew Saras, LPN   [id number]   Due to this being a virtual visit, the after visit summary with patients personalized plan was offered to patient via mail or my-chart. Patient would like to access on my-chart  Nurse Notes: Patient with medication management questions with Pristiq and Trazodone, as well as if it is ok for her to take Ibuprofen.  Will route via a telephone note.

## 2022-08-30 ENCOUNTER — Other Ambulatory Visit: Payer: Self-pay | Admitting: Family Medicine

## 2022-08-30 DIAGNOSIS — I509 Heart failure, unspecified: Secondary | ICD-10-CM

## 2022-09-02 ENCOUNTER — Other Ambulatory Visit: Payer: Self-pay | Admitting: Cardiology

## 2022-09-07 DIAGNOSIS — H43813 Vitreous degeneration, bilateral: Secondary | ICD-10-CM | POA: Diagnosis not present

## 2022-09-07 DIAGNOSIS — H353122 Nonexudative age-related macular degeneration, left eye, intermediate dry stage: Secondary | ICD-10-CM | POA: Diagnosis not present

## 2022-09-07 DIAGNOSIS — H353213 Exudative age-related macular degeneration, right eye, with inactive scar: Secondary | ICD-10-CM | POA: Diagnosis not present

## 2022-09-08 ENCOUNTER — Other Ambulatory Visit (HOSPITAL_COMMUNITY): Payer: Medicare HMO

## 2022-09-08 ENCOUNTER — Telehealth: Payer: Self-pay

## 2022-09-08 ENCOUNTER — Encounter (HOSPITAL_COMMUNITY): Payer: Self-pay

## 2022-09-08 ENCOUNTER — Other Ambulatory Visit: Payer: Self-pay | Admitting: Family Medicine

## 2022-09-08 DIAGNOSIS — E034 Atrophy of thyroid (acquired): Secondary | ICD-10-CM

## 2022-09-08 NOTE — Telephone Encounter (Signed)
PA has been Approved   Tammie Martin (Key: [place] Faster) PA Case ID #: [id number]  Called and left vm informing pt

## 2022-09-12 ENCOUNTER — Encounter: Payer: Self-pay | Admitting: Neurology

## 2022-09-12 ENCOUNTER — Ambulatory Visit: Payer: Medicare HMO | Admitting: Neurology

## 2022-09-12 ENCOUNTER — Ambulatory Visit: Payer: Medicare HMO | Admitting: Family Medicine

## 2022-09-12 VITALS — BP 101/69 | HR 76 | Ht 62.5 in | Wt 117.0 lb

## 2022-09-12 DIAGNOSIS — Z5181 Encounter for therapeutic drug level monitoring: Secondary | ICD-10-CM

## 2022-09-12 DIAGNOSIS — G40209 Localization-related (focal) (partial) symptomatic epilepsy and epileptic syndromes with complex partial seizures, not intractable, without status epilepticus: Secondary | ICD-10-CM

## 2022-09-12 MED ORDER — LAMOTRIGINE 25 MG PO TABS: ORAL_TABLET | ORAL | 0 refills | Status: DC

## 2022-09-12 NOTE — Patient Instructions (Signed)
Continue with Keppra 750 mg twice daily  Start with Lamotrigine as below  Week 1: '25mg'$  (one pill) at night  Week 2: '25mg'$  (one pill) twice daily Week 3: '25mg'$  (one pill) morning and '50mg'$  (two pills) night  Week 4: '50mg'$  (two pills) twice daily  Week 5: '75mg'$  (three pills) twice daily Week 6: '100mg'$  (fours pills) twice daily  Will obtain a Lamotrigine level (blood test) and complete metabolic panel after week 6  Please stop the medication and call the office as soon as you develop a new rash  Follow up in 6 months

## 2022-09-12 NOTE — Progress Notes (Signed)
[facility]  PATIENT: Tammie Martin DOB: [birth year]  REQUESTING CLINICIAN: Janora Norlander, DO HISTORY FROM: Patient and sister  REASON FOR VISIT: Follow up seizure, recent CVA    HISTORICAL  CHIEF COMPLAINT:  Chief Complaint  Patient presents with   Follow-up    Rm 13. Alone. Pt is unsure of any new seizure activity. Reports in Dec 2023 she went to sleep, saw many colors, and woke up wobbly. C/o depressive episodes.   INTERVAL HISTORY 09/12/2021:  Patient presents today for follow-up, since last visit in July, she reports that she has been up and down.  She reports a long history of depression and short-term memory.  She is compliant with the Keppra 750 mg twice daily.  She is also on venlafaxine.  She denies any seizures but reports in December while waking up in the morning he had an  episode of positive visual phenomenon, saw so many colors with her eyes closed.  Afterward she experienced a headache.  She only had that 1 episode.  No other complaints.    HISTORY OF PRESENT ILLNESS:  This is a 72 year old woman past medical history of atrial fibrillation on Eliquis, hypertension hyperlipidemia COPD who recently had a TAVR on 6/6 which was complicated by multiple embolic strokes.  The strokes were multiple and located in the left frontal cortex, left posterior temporal cortex and bilateral cerebellar related to the left atrial appendage thrombus.  Her Eliquis was resumed and patient discharged home.  She presented to the hospital on 6/12 with episode of altered mental status. While in the ED, she was noted to have a generalized tonic-clonic seizure that was stopped with Ativan.  At that time she was loaded on Keppra and transferred to [facility].  While in [place] she did have a seizure captured on EEG.  Keppra was continued, up to 1500 mg twice daily.  Since discharge from the hospital, she has not had any additional seizures but report episodes of  confusion about the recent hospitalization, there is also hallucinations after leaving the hospital but those hallucinations resolved.  Sister reported that patient has memory problems she is forgetful, she is irritable and 2 weeks ago she had a fall and hit her face forward, had severe bruising in the on the face.   OTHER MEDICAL CONDITIONS: Atrial fibrillation, hypertension, hyperlipidemia, COPD, recent multiple embolic stroke and seizure disorder.   REVIEW OF SYSTEMS: Full 14 system review of systems performed and negative with exception of: As noted in the HPI  ALLERGIES: Allergies  Allergen Reactions   Calcium Channel Blockers    Penicillins Hives   Singulair [Montelukast Sodium]     headache   Chantix [Varenicline] Nausea Only    GI intolerance with higher doses   Codeine Rash   Nicoderm [Nicotine] Hives and Swelling   Other Rash    Band-aids cause rash after a few days    HOME MEDICATIONS: Outpatient Medications Prior to Visit  Medication Sig Dispense Refill   albuterol (VENTOLIN HFA) 108 (90 Base) MCG/ACT inhaler Inhale 2 puffs into the lungs every 6 (six) hours as needed for wheezing or shortness of breath. 8 g 0   atorvastatin (LIPITOR) 40 MG tablet TAKE ONE TABLET BY MOUTH DAILY 90 tablet 3   azithromycin (ZITHROMAX) 500 MG tablet Take 1 tablet (500 mg total) by mouth as directed. TAKE 500 MG  1 HOUR PRIOR TO ANY DENTAL  CLEANINGS AND PROCEDURES. 4 tablet 6   bisoprolol (ZEBETA) 10  MG tablet Take 1 and 1/2 tablets (15 mg total) by mouth daily. 135 tablet 3   CALCIUM-MAGNESIUM PO Take 1 tablet by mouth daily.     cetirizine (ZYRTEC) 10 MG tablet Take 10 mg by mouth daily.     Cholecalciferol (VITAMIN D) 50 MCG (2000 UT) tablet Take 2,000 Units by mouth daily.     desvenlafaxine (PRISTIQ) 50 MG 24 hr tablet TAKE ONE TABLET BY MOUTH DAILY 90 tablet 1   ELIQUIS 5 MG TABS tablet TAKE ONE TABLET BY MOUTH TWICE DAILY 60 tablet 2   fluticasone (FLONASE) 50 MCG/ACT nasal spray  Place 2 sprays into both nostrils daily as needed for allergies or rhinitis. 48 g 3   furosemide (LASIX) 20 MG tablet TAKE ONE TABLET BY MOUTH DAILY 30 tablet 0   hydrocortisone cream 1 % Apply 1 application. topically 2 (two) times daily as needed (rash).     levETIRAcetam (KEPPRA) 750 MG tablet Take 1 tablet (750 mg total) by mouth 2 (two) times daily. 180 tablet 3   levothyroxine (SYNTHROID) 88 MCG tablet TAKE ONE TABLET BY MOUTH DAILY 90 tablet 3   Multiple Vitamins-Minerals (ALIVE WOMENS 50+ PO) Take 1 tablet by mouth daily.     Multiple Vitamins-Minerals (PRESERVISION AREDS PO) Take 1 capsule by mouth 2 (two) times daily.     Omega-3-6-9 CAPS Take 1 capsule by mouth 2 (two) times daily.     Polyethyl Glycol-Propyl Glycol (SYSTANE OP) Place 1 drop into both eyes daily as needed (dry eyes).     potassium chloride SA (KLOR-CON M) 10 MEQ tablet Take 1 tablet (10 mEq total) by mouth daily. 30 tablet 3   Probiotic Product (PROBIOTIC ADVANCED PO) Take 1 capsule by mouth daily.     traZODone (DESYREL) 50 MG tablet Take 1 tablet (50 mg total) by mouth at bedtime as needed for sleep. 90 tablet 3   TURMERIC PO Take 1 capsule by mouth 2 (two) times daily.     No facility-administered medications prior to visit.    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 CBurt Knackand Dr. BCyndia Bent  Seizures ([facility]    Severe aortic stenosis    Stroke ([facility]    [place]    Thyroid disease    Tobacco abuse      PAST SURGICAL HISTORY: Past Surgical History:  Procedure Laterality Date   AORTIC VALVE REPLACEMENT     APPENDECTOMY     pt recently had CT that said Appendix was there so she is unsure if this was actually removed during hysterectomy as she was previously told   BREAST ENHANCEMENT SURGERY     BREAST SURGERY     CARDIAC VALVE REPLACEMENT     CARDIOVERSION N/A 10/27/2021   Procedure: CARDIOVERSION;  Surgeon: BArnoldo Lenis MD;  Location: [facility];  Service: Endoscopy;  Laterality: N/A;   INTRAOPERATIVE TRANSESOPHAGEAL  ECHOCARDIOGRAM N/A 02/01/2022   Procedure: INTRAOPERATIVE TRANSESOPHAGEAL ECHOCARDIOGRAM;  Surgeon: Sherren Mocha, MD;  Location: [facility];  Service: Open Heart Surgery;  Laterality: N/A;   RIGHT/LEFT HEART CATH AND CORONARY ANGIOGRAPHY N/A 11/22/2021   Procedure: RIGHT/LEFT HEART CATH AND CORONARY ANGIOGRAPHY;  Surgeon: Sherren Mocha, MD;  Location: [facility];  Service: Cardiovascular;  Laterality: N/A;   TONSILLECTOMY     VAGINAL HYSTERECTOMY      FAMILY HISTORY: Family History  Problem Relation Age of Onset   Cancer Mother        breast cancer at 79    Kidney disease Mother    Early death Mother    Cancer Maternal Aunt        colon cancer   Heart attack Father    Early death Father    Heart disease Father    Diabetes Sister    Obesity Sister    Depression Sister    Arthritis Sister    Alcohol abuse Brother    Cirrhosis Brother    Depression Brother    Liver disease Brother    Drug abuse Brother    Early death Brother    Diabetes Daughter    Fibromyalgia Daughter    Depression Daughter    Anxiety disorder Daughter    Arthritis Daughter    Depression Sister    Obesity Sister    Diabetes Sister    Early death Sister     SOCIAL HISTORY: Social History   Socioeconomic History   Marital status: Widowed    Spouse name: Not on file   Number of children: 2   Years of education: 14   Highest education level: Associate degree:  academic program  Occupational History   Occupation: Retired    Comment: Equities trader  Tobacco Use   Smoking status: Every Day    Packs/day: 1.00    Years: 40.00    Total pack years: 40.00    Types: Cigarettes   Smokeless tobacco: Never   Tobacco comments:    Cant afford medication to help with cessation  Vaping Use   Vaping Use: Never used  Substance and Sexual Activity   Alcohol use: Not Currently   Drug use: No   Sexual activity: Not Currently  Other Topics Concern   Not on file  Social History Narrative   Daughter currently living with her  one cat, one dog   Social Determinants of Health   Financial Resource Strain: Low Risk  (08/17/2022)   Overall Financial Resource Strain (CARDIA)    Difficulty of Paying Living Expenses: Not very hard  Food Insecurity: No Food Insecurity (08/17/2022)   Hunger Vital Sign    Worried About Running Out of Food in the Last Year: Never true    Ran Out of Food in the Last Year: Never true  Transportation Needs: No Transportation Needs (08/17/2022)   PRAPARE - Hydrologist (Medical): No    Lack of Transportation (Non-Medical): No  Physical Activity: Inactive (08/17/2022)   Exercise Vital Sign    Days of Exercise per Week: 0 days    Minutes of Exercise per Session: 0 min  Stress: No Stress Concern Present (08/17/2022)   [place]    Feeling of Stress : Not at all  Social Connections: Moderately Isolated (08/17/2022)   Social Connection and Isolation Panel [NHANES]    Frequency of Communication with Friends and Family: More than three times a  week    Frequency of Social Gatherings with Friends and Family: More than three times a week    Attends Religious Services: Never    Marine scientist or Organizations: Yes    Attends Archivist Meetings: 1 to 4 times per year    Marital Status: Widowed  Intimate Partner Violence: Not  At Risk (08/17/2022)   Humiliation, Afraid, Rape, and Kick questionnaire    Fear of Current or Ex-Partner: No    Emotionally Abused: No    Physically Abused: No    Sexually Abused: No    PHYSICAL EXAM  GENERAL EXAM/CONSTITUTIONAL: Vitals:  Vitals:   09/12/22 1105  BP: 101/69  Pulse: 76  Weight: 117 lb (53.1 kg)  Height: 5' 2.5" (1.588 m)    Body mass index is 21.06 kg/m. Wt Readings from Last 3 Encounters:  09/12/22 117 lb (53.1 kg)  08/17/22 118 lb (53.5 kg)  06/10/22 118 lb 9.6 oz (53.8 kg)   Patient is in no distress; well developed, nourished and groomed; neck is supple  EYES: Visual fields full to confrontation, Extraocular movements intacts,   MUSCULOSKELETAL: Gait, strength, tone, movements noted in Neurologic exam below  NEUROLOGIC: MENTAL STATUS:     03/21/2018    3:50 PM  MMSE - Mini Mental State Exam  Orientation to time 5  Orientation to Place 5  Registration 3  Attention/ Calculation 5  Recall 3  Language- name 2 objects 2  Language- repeat 1  Language- follow 3 step command 3  Language- read & follow direction 1  Write a sentence 1  Copy design 1  Total score 30   awake, alert, oriented to person, place and time recent and remote memory intact normal attention and concentration language fluent, comprehension intact, naming intact fund of knowledge appropriate  CRANIAL NERVE:  2nd, 3rd, 4th, 6th -visual fields full to confrontation, extraocular muscles intact, no nystagmus 5th - facial sensation symmetric 7th - facial strength symmetric 8th - hearing intact 9th - palate elevates symmetrically, uvula midline 11th - shoulder shrug symmetric 12th - tongue protrusion midline  MOTOR:  normal bulk and tone, full strength in the BUE, BLE  SENSORY:  normal and symmetric to light touch  COORDINATION:  finger-nose-finger, fine finger movements normal  GAIT/STATION:  normal   DIAGNOSTIC DATA (LABS, IMAGING, TESTING) - I reviewed  patient records, labs, notes, testing and imaging myself where available.  Lab Results  Component Value Date   WBC 9.3 02/25/2022   HGB 14.6 02/25/2022   HCT 42.8 02/25/2022   MCV 94 02/25/2022   PLT 182 02/25/2022      Component Value Date/Time   NA 139 02/25/2022 1410   K 4.5 02/25/2022 1410   CL 97 02/25/2022 1410   CO2 27 02/25/2022 1410   GLUCOSE 100 (H) 02/25/2022 1410   GLUCOSE 113 (H) 02/11/2022 0108   BUN 12 02/25/2022 1410   CREATININE 0.70 02/25/2022 1410   CALCIUM 10.3 02/25/2022 1410   PROT 6.9 03/14/2022 1113   ALBUMIN 3.8 03/14/2022 1113   AST 26 03/14/2022 1113   ALT 27 03/14/2022 1113   ALKPHOS 149 (H) 03/14/2022 1113   BILITOT 0.3 03/14/2022 1113   GFRNONAA >60 02/11/2022 0108   GFRAA 111 03/06/2020 1057   Lab Results  Component Value Date   CHOL 133 02/05/2022   HDL 36 (L) 02/05/2022   LDLCALC 79 02/05/2022   TRIG 89 02/05/2022   CHOLHDL 3.7 02/05/2022   Lab Results  Component  Value Date   HGBA1C 5.6 02/04/2022   Lab Results  Component Value Date   VITAMINB12 1,022 06/10/2022   Lab Results  Component Value Date   TSH 3.490 08/15/2022    MRI Brain 02/07/22 1. Significantly motion limited study. Punctate foci of apparent restricted diffusion in the left fronta and right occipital lobes and right cerebellum could represent punctate acute infarcts or artifact. 2. Moderate chronic microvascular ischemic disease.   LTM EEG 02/08/22 - Seizure without clinical sign, left posterior quadrant - Spike, left posterior quadrant - Intermittent slow, generalized and lateralized left hemisphere   ASSESSMENT AND PLAN  72 y.o. year old female with medical history including atrial fibrillation, hypertension, hyperlipidemia, COPD, recent multiple embolic strokes and seizure disorder who is presenting for follow up.  No seizures since last visit but have 1 episode of positive phenomenon.  She also reports episode of depression that may be worsened by the  [place].  I will switch her from Keppra to lamotrigine.  Titration given to patient.  In 6 weeks she will present for lab work at that time we will can discontinue the Keppra and continue her on lamotrigine 100 mg twice daily.  She voices understanding.  I will see her in 6 months for follow-up or sooner if worse.   1. Partial symptomatic epilepsy with complex partial seizures, not intractable, without status epilepticus ([place])   2. Therapeutic drug monitoring     Patient Instructions  Continue with Keppra 750 mg twice daily  Start with Lamotrigine as below  Week 1: '25mg'$  (one pill) at night  Week 2: '25mg'$  (one pill) twice daily Week 3: '25mg'$  (one pill) morning and '50mg'$  (two pills) night  Week 4: '50mg'$  (two pills) twice daily  Week 5: '75mg'$  (three pills) twice daily Week 6: '100mg'$  (fours pills) twice daily  Will obtain a Lamotrigine level (blood test) and complete metabolic panel after week 6  Please stop the medication and call the office as soon as you develop a new rash  Follow up in 6 months    Orders Placed This Encounter  Procedures   Lamotrigine level   Basic Metabolic Panel    Meds ordered this encounter  Medications   lamoTRIgine (LAMICTAL) 25 MG tablet    Sig: Start with Lamotrigine '25mg'$  (one pill) at night for one week, then '25mg'$  (one pill) twice daily for one week, then '25mg'$  (one pill) morning and '50mg'$  (two pills) night for one week, then '50mg'$  (two pills) twice daily for one week then '75mg'$  (three pills) twice daily for one week then finally '100mg'$  (fours pills) twice daily    Dispense:  240 tablet    Refill:  0    Return in about 6 months (around 03/13/2023).    Alric Ran, MD 09/12/2022, 12:07 PM  [facility] [address] [place], [place] [address] [phone]

## 2022-09-23 ENCOUNTER — Other Ambulatory Visit: Payer: Self-pay | Admitting: Family Medicine

## 2022-09-23 DIAGNOSIS — F411 Generalized anxiety disorder: Secondary | ICD-10-CM

## 2022-09-23 DIAGNOSIS — F329 Major depressive disorder, single episode, unspecified: Secondary | ICD-10-CM

## 2022-10-21 ENCOUNTER — Ambulatory Visit: Payer: Medicare HMO | Admitting: Family Medicine

## 2022-10-25 ENCOUNTER — Telehealth: Payer: Self-pay | Admitting: Family Medicine

## 2022-10-25 ENCOUNTER — Encounter: Payer: Self-pay | Admitting: Family Medicine

## 2022-10-25 NOTE — Telephone Encounter (Signed)
Patient needs to be seen.

## 2022-11-01 ENCOUNTER — Ambulatory Visit (INDEPENDENT_AMBULATORY_CARE_PROVIDER_SITE_OTHER): Payer: Medicare HMO | Admitting: Family Medicine

## 2022-11-01 ENCOUNTER — Encounter: Payer: Self-pay | Admitting: Family Medicine

## 2022-11-01 VITALS — BP 126/85 | HR 68 | Temp 98.8°F | Ht 62.0 in | Wt 114.0 lb

## 2022-11-01 DIAGNOSIS — Z5181 Encounter for therapeutic drug level monitoring: Secondary | ICD-10-CM

## 2022-11-01 DIAGNOSIS — G40209 Localization-related (focal) (partial) symptomatic epilepsy and epileptic syndromes with complex partial seizures, not intractable, without status epilepticus: Secondary | ICD-10-CM

## 2022-11-01 DIAGNOSIS — E034 Atrophy of thyroid (acquired): Secondary | ICD-10-CM

## 2022-11-01 DIAGNOSIS — K5900 Constipation, unspecified: Secondary | ICD-10-CM | POA: Diagnosis not present

## 2022-11-01 DIAGNOSIS — K644 Residual hemorrhoidal skin tags: Secondary | ICD-10-CM

## 2022-11-01 MED ORDER — POLYETHYLENE GLYCOL 3350 17 GM/SCOOP PO POWD: ORAL | 99 refills | Status: DC

## 2022-11-01 MED ORDER — HYDROCORTISONE ACETATE 25 MG RE SUPP: 25.0000 mg | Freq: Two times a day (BID) | RECTAL | 0 refills | Status: DC | PRN

## 2022-11-01 NOTE — Patient Instructions (Signed)
Hemorrhoids Hemorrhoids are swollen veins that may form: In the butt (rectum). These are called internal hemorrhoids. Around the opening of the butt (anus). These are called external hemorrhoids. Most hemorrhoids do not cause very bad problems. They often get better with changes to your lifestyle and what you eat. What are the causes? Having trouble pooping (constipation) or watery poop (diarrhea). Pushing too hard when you poop. Pregnancy. Being very overweight (obese). Sitting for too long. Riding a bike for a long time. Heavy lifting or other things that take a lot of effort. Anal sex. What are the signs or symptoms? Pain. Itching or soreness in the butt. Bleeding from the butt. Leaking poop. Swelling. One or more lumps around the opening of your butt. How is this treated? In most cases, hemorrhoids can be treated at home. You may be told to: Change what you eat. Make changes to your lifestyle. If these treatments do not help, you may need to have a procedure done. Your doctor may need to: Place rubber bands at the bottom of the hemorrhoids to make them fall off. Put medicine into the hemorrhoids to shrink them. Shine a type of light on the hemorrhoids to cause them to fall off. Do surgery to get rid of the hemorrhoids. Follow these instructions at home: Medicines Take over-the-counter and prescription medicines only as told by your doctor. Use creams with medicine in them or medicines that you put in your butt as told by your doctor. Eating and drinking  Eat foods that have a lot of fiber in them. These include whole grains, beans, nuts, fruits, and vegetables. Ask your doctor about taking products that have fiber added to them (fibersupplements). Take in less fat. You can do this by: Eating low-fat dairy products. Eating less red meat. Staying away from processed foods. Drink enough fluid to keep your pee (urine) pale yellow. Managing pain and swelling  Take a  warm-water bath (sitz bath) for 20 minutes to ease pain. Do this 3-4 times a day. You may do this in a bathtub. You may also use a portable sitz bath that fits over the toilet. If told, put ice on the painful area. It may help to use ice between your warm baths. Put ice in a plastic bag. Place a towel between your skin and the bag. Leave the ice on for 20 minutes, 2-3 times a day. If your skin turns bright red, take off the ice right away to prevent skin damage. The risk of damage is higher if you cannot feel pain, heat, or cold. General instructions Exercise. Ask your doctor how much and what kind of exercise is best for you. Go to the bathroom when you need to poop. Do not wait. Try not to push too hard when you poop. Keep your butt dry and clean. Use wet toilet paper or moist towelettes after you poop. Do not sit on the toilet for a long time. Contact a doctor if: You have pain and swelling that do not get better with treatment. You have trouble pooping. You cannot poop. You have pain or swelling outside the area of the hemorrhoids. Get help right away if: You have bleeding from the butt that will not stop. This information is not intended to replace advice given to you by your health care provider. Make sure you discuss any questions you have with your health care provider. Document Revised: 04/27/2022 Document Reviewed: 04/27/2022 Elsevier Patient Education  [place]. Constipation, Adult Constipation is when a  person has trouble pooping (having a bowel movement). When you have this condition, you may poop fewer than 3 times a week. Your poop (stool) may also be dry, hard, or bigger than normal. Follow these instructions at home: Eating and drinking  Eat foods that have a lot of fiber, such as: Fresh fruits and vegetables. Whole grains. Beans. Eat less of foods that are low in fiber and high in fat and sugar, such as: [place]  fries. Hamburgers. Cookies. Candy. Soda. Drink enough fluid to keep your pee (urine) pale yellow. General instructions Exercise regularly or as told by your doctor. Try to do 150 minutes of exercise each week. Go to the restroom when you feel like you need to poop. Do not hold it in. Take over-the-counter and prescription medicines only as told by your doctor. These include any fiber supplements. When you poop: Do deep breathing while relaxing your lower belly (abdomen). Relax your pelvic floor. The pelvic floor is a group of muscles that support the rectum, bladder, and intestines (as well as the uterus in women). Watch your condition for any changes. Tell your doctor if you notice any. Keep all follow-up visits as told by your doctor. This is important. Contact a doctor if: You have pain that gets worse. You have a fever. You have not pooped for 4 days. You vomit. You are not hungry. You lose weight. You are bleeding from the opening of the butt (anus). You have thin, pencil-like poop. Get help right away if: You have a fever, and your symptoms suddenly get worse. You leak poop or have blood in your poop. Your belly feels hard or bigger than normal (bloated). You have very bad belly pain. You feel dizzy or you faint. Summary Constipation is when a person poops fewer than 3 times a week, has trouble pooping, or has poop that is dry, hard, or bigger than normal. Eat foods that have a lot of fiber. Drink enough fluid to keep your pee (urine) pale yellow. Take over-the-counter and prescription medicines only as told by your doctor. These include any fiber supplements. This information is not intended to replace advice given to you by your health care provider. Make sure you discuss any questions you have with your health care provider. Document Revised: 06/29/2022 Document Reviewed: 06/29/2022 Elsevier Patient Education  [place].

## 2022-11-01 NOTE — Progress Notes (Signed)
Subjective: CC: Hemorrhoids PCP: Janora Norlander, DO [id number] [name] is a 72 y.o. female presenting to clinic today for:  1.  Hemorrhoids/ constipation Patient reports that she has been having some rectal bleeding and a palpable knot at her rectum that she suspects is a hemorrhoid.  This is been ongoing for about a week.  The bleeding really stopped with bowel movements but she has noticed a couple of clots from her rectum.  She admits that she has constipation with several small ball-like stools and she strains frequently.  She has been utilizing some Metamucil but this was not helpful.  She admits that she does not hydrate well.  She has been utilizing over-the-counter Preparation H in efforts to alleviate the symptoms.  2.  Mood Patient reports Lamictal was added by her specialist recently.  She continues to take Pristiq but has been not utilizing trazodone because she was worried there might be a drug interaction.  Sometimes she feels a little jittery but this is not new to her.  She is up to the 100 mg dose now.  No rashes   ROS: Per HPI  Allergies  Allergen Reactions   Calcium Channel Blockers    Penicillins Hives   Singulair [Montelukast Sodium]     headache   Chantix [Varenicline] Nausea Only    GI intolerance with higher doses   Codeine Rash   Nicoderm [Nicotine] Hives and Swelling   Other Rash    Band-aids cause rash after a few days   Past Medical History:  Diagnosis Date   Allergy    Anxiety    Atrial fibrillation (HCC)    Cancer (HCC) 1979   cervical cancer   Cataract    COPD (chronic obstructive pulmonary disease) (HCC)    Depression    DJD (degenerative joint disease)    Emphysema of lung (HCC)    Heart murmur    History of cervical cancer    Hyperlipidemia    Hypertension    Hypothyroidism    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 CBurt Knackand Dr.  BCyndia Bent  Seizures ([facility]    Severe aortic stenosis    Stroke ([facility]    Substance abuse ([place]    Thyroid disease    Tobacco abuse     Current Outpatient Medications:    albuterol (VENTOLIN HFA) 108 (90 Base) MCG/ACT inhaler, Inhale 2 puffs into the lungs every 6 (six) hours as needed for wheezing or shortness of breath., Disp: 8 g, Rfl: 0   atorvastatin (LIPITOR) 40 MG tablet, TAKE ONE TABLET BY MOUTH DAILY, Disp: 90 tablet, Rfl: 3   azithromycin (ZITHROMAX) 500 MG tablet, Take 1 tablet (500 mg total) by mouth as directed. TAKE 500 MG  1 HOUR PRIOR TO ANY DENTAL  CLEANINGS AND PROCEDURES., Disp: 4 tablet, Rfl: 6   bisoprolol (ZEBETA) 10 MG tablet, Take 1 and 1/2 tablets (15 mg total) by mouth daily., Disp: 135 tablet, Rfl: 3   CALCIUM-MAGNESIUM PO, Take 1 tablet by mouth daily., Disp: , Rfl:    cetirizine (ZYRTEC) 10 MG tablet, Take 10 mg by mouth daily., Disp: , Rfl:    Cholecalciferol (VITAMIN D) 50 MCG (2000 UT) tablet, Take 2,000 Units by mouth daily., Disp: , Rfl:    desvenlafaxine (PRISTIQ) 50 MG 24 hr tablet, Take 1 tablet (50 mg total) by mouth daily. (NEEDS TO BE SEEN BEFORE NEXT  REFILL), Disp: 30 tablet, Rfl: 0   ELIQUIS 5 MG TABS tablet, TAKE ONE TABLET BY MOUTH TWICE DAILY, Disp: 60 tablet, Rfl: 2   fluticasone (FLONASE) 50 MCG/ACT nasal spray, Place 2 sprays into both nostrils daily as needed for allergies or rhinitis., Disp: 48 g, Rfl: 3   furosemide (LASIX) 20 MG tablet, TAKE ONE TABLET BY MOUTH DAILY, Disp: 30 tablet, Rfl: 0   hydrocortisone cream 1 %, Apply 1 application. topically 2 (two) times daily as needed (rash)., Disp: , Rfl:    lamoTRIgine (LAMICTAL) 25 MG tablet, Start with Lamotrigine '25mg'$  (one pill) at night for one week, then '25mg'$  (one pill) twice daily for one week, then '25mg'$  (one pill) morning and '50mg'$  (two pills) night for one week, then '50mg'$  (two pills) twice daily for one week then '75mg'$  (three pills) twice daily for one week then finally '100mg'$  (fours pills) twice  daily, Disp: 240 tablet, Rfl: 0   levETIRAcetam (KEPPRA) 750 MG tablet, Take 1 tablet (750 mg total) by mouth 2 (two) times daily., Disp: 180 tablet, Rfl: 3   levothyroxine (SYNTHROID) 88 MCG tablet, TAKE ONE TABLET BY MOUTH DAILY, Disp: 90 tablet, Rfl: 3   Multiple Vitamins-Minerals (ALIVE WOMENS 50+ PO), Take 1 tablet by mouth daily., Disp: , Rfl:    Multiple Vitamins-Minerals (PRESERVISION AREDS PO), Take 1 capsule by mouth 2 (two) times daily., Disp: , Rfl:    Omega-3-6-9 CAPS, Take 1 capsule by mouth 2 (two) times daily., Disp: , Rfl:    Polyethyl Glycol-Propyl Glycol (SYSTANE OP), Place 1 drop into both eyes daily as needed (dry eyes)., Disp: , Rfl:    potassium chloride SA (KLOR-CON M) 10 MEQ tablet, Take 1 tablet (10 mEq total) by mouth daily., Disp: 30 tablet, Rfl: 3   Probiotic Product (PROBIOTIC ADVANCED PO), Take 1 capsule by mouth daily., Disp: , Rfl:    traZODone (DESYREL) 50 MG tablet, Take 1 tablet (50 mg total) by mouth at bedtime as needed for sleep., Disp: 90 tablet, Rfl: 3   TURMERIC PO, Take 1 capsule by mouth 2 (two) times daily., Disp: , Rfl:  Social History   Socioeconomic History   Marital status: Widowed    Spouse name: Not on file   Number of children: 2   Years of education: 14   Highest education level: Associate degree: academic program  Occupational History   Occupation: Retired    Comment: Equities trader  Tobacco Use   Smoking status: Every Day    Packs/day: 1.00    Years: 40.00    Total pack years: 40.00    Types: Cigarettes   Smokeless tobacco: Never   Tobacco comments:    Cant afford medication to help with cessation  Vaping Use   Vaping Use: Never used  Substance and Sexual Activity   Alcohol use: Not Currently   Drug use: No   Sexual activity: Not Currently  Other Topics Concern   Not on file  Social History Narrative   Daughter currently living with her  one cat, one dog   Social Determinants of Health   Financial Resource Strain:  Low Risk  (08/17/2022)   Overall Financial Resource Strain (CARDIA)    Difficulty of Paying Living Expenses: Not very hard  Food Insecurity: No Food Insecurity (08/17/2022)   Hunger Vital Sign    Worried About Running Out of Food in the Last Year: Never true    Ran Out of Food in the Last Year: Never true  Transportation Needs:  No Transportation Needs (08/17/2022)   PRAPARE - Hydrologist (Medical): No    Lack of Transportation (Non-Medical): No  Physical Activity: Inactive (08/17/2022)   Exercise Vital Sign    Days of Exercise per Week: 0 days    Minutes of Exercise per Session: 0 min  Stress: No Stress Concern Present (08/17/2022)   [place]    Feeling of Stress : Not at all  Social Connections: Moderately Isolated (08/17/2022)   Social Connection and Isolation Panel [NHANES]    Frequency of Communication with Friends and Family: More than three times a week    Frequency of Social Gatherings with Friends and Family: More than three times a week    Attends Religious Services: Never    Marine scientist or Organizations: Yes    Attends Archivist Meetings: 1 to 4 times per year    Marital Status: Widowed  Intimate Partner Violence: Not At Risk (08/17/2022)   Humiliation, Afraid, Rape, and Kick questionnaire    Fear of Current or Ex-Partner: No    Emotionally Abused: No    Physically Abused: No    Sexually Abused: No   Family History  Problem Relation Age of Onset   Cancer Mother        breast cancer at 18    Kidney disease Mother    Early death Mother    Cancer Maternal Aunt        colon cancer   Heart attack Father    Early death Father    Heart disease Father    Diabetes Sister    Obesity Sister    Depression Sister    Arthritis Sister    Alcohol abuse Brother    Cirrhosis Brother    Depression Brother    Liver disease Brother    Drug abuse Brother     Early death Brother    Diabetes Daughter    Fibromyalgia Daughter    Depression Daughter    Anxiety disorder Daughter    Arthritis Daughter    Depression Sister    Obesity Sister    Diabetes Sister    Early death Sister     Objective: Office vital signs reviewed. BP 126/85   Pulse 68   Temp 98.8 F (37.1 C)   Ht '5\' 2"'$  (1.575 m)   Wt 114 lb (51.7 kg)   SpO2 93%   BMI 20.85 kg/m   Physical Examination:  General: Awake, alert, chronically ill-appearing female, No acute distress Rectal exam: Pea-sized thrombosed hemorrhoid noted at the 4 o'clock position of the anus.  No significant tenderness and no active bleeding appreciated.  There is some stool along the anus.  Assessment/ Plan: 72 y.o. female   Bleeding external hemorrhoids - Plan: hydrocortisone (ANUSOL-HC) 25 MG suppository, CBC  Constipation, unspecified constipation type - Plan: polyethylene glycol powder (GLYCOLAX/MIRALAX) 17 GM/SCOOP powder  Hypothyroidism due to acquired atrophy of thyroid - Plan: TSH, T4, Free  Partial symptomatic epilepsy with complex partial seizures, not intractable, without status epilepticus ([place]) - Plan: Basic Metabolic Panel, Lamotrigine level  Therapeutic drug monitoring - Plan: Basic Metabolic Panel, Lamotrigine level  Sitz bath's recommended.  Anusol suppository sent.  Check CBC given reports of bleeding.  I highly recommended stool regimen and we have prescribed MiraLAX.  Close follow-up recommended  Check thyroid levels given constipation and recent change in manufacturer in January  Lamictal recently started by a specialist.  Not having any concerning rashes.  She will have these labs collected along with my labs today  No orders of the defined types were placed in this encounter.  No orders of the defined types were placed in this encounter.    Janora Norlander, DO [place] [phone]

## 2022-11-02 ENCOUNTER — Emergency Department (HOSPITAL_COMMUNITY): Payer: Medicare HMO

## 2022-11-02 ENCOUNTER — Encounter (HOSPITAL_COMMUNITY): Payer: Self-pay

## 2022-11-02 ENCOUNTER — Other Ambulatory Visit: Payer: Self-pay

## 2022-11-02 ENCOUNTER — Inpatient Hospital Stay (HOSPITAL_COMMUNITY)
Admission: EM | Admit: 2022-11-02 | Discharge: 2022-11-08 | DRG: 481 | Disposition: A | Discharge status: Skilled Nursing Facility | Payer: Medicare HMO | Attending: Internal Medicine | Admitting: Internal Medicine

## 2022-11-02 ENCOUNTER — Other Ambulatory Visit: Payer: Self-pay | Admitting: Family Medicine

## 2022-11-02 DIAGNOSIS — W010XXA Fall on same level from slipping, tripping and stumbling without subsequent striking against object, initial encounter: Secondary | ICD-10-CM | POA: Diagnosis present

## 2022-11-02 DIAGNOSIS — E785 Hyperlipidemia, unspecified: Secondary | ICD-10-CM | POA: Diagnosis present

## 2022-11-02 DIAGNOSIS — J439 Emphysema, unspecified: Secondary | ICD-10-CM | POA: Diagnosis present

## 2022-11-02 DIAGNOSIS — W19XXXA Unspecified fall, initial encounter: Secondary | ICD-10-CM | POA: Diagnosis not present

## 2022-11-02 DIAGNOSIS — Z7901 Long term (current) use of anticoagulants: Secondary | ICD-10-CM

## 2022-11-02 DIAGNOSIS — S72142A Displaced intertrochanteric fracture of left femur, initial encounter for closed fracture: Principal | ICD-10-CM | POA: Diagnosis present

## 2022-11-02 DIAGNOSIS — F32A Depression, unspecified: Secondary | ICD-10-CM | POA: Diagnosis present

## 2022-11-02 DIAGNOSIS — Z79890 Hormone replacement therapy (postmenopausal): Secondary | ICD-10-CM

## 2022-11-02 DIAGNOSIS — I4891 Unspecified atrial fibrillation: Secondary | ICD-10-CM | POA: Diagnosis present

## 2022-11-02 DIAGNOSIS — Z91048 Other nonmedicinal substance allergy status: Secondary | ICD-10-CM

## 2022-11-02 DIAGNOSIS — I480 Paroxysmal atrial fibrillation: Secondary | ICD-10-CM | POA: Diagnosis present

## 2022-11-02 DIAGNOSIS — Z885 Allergy status to narcotic agent status: Secondary | ICD-10-CM

## 2022-11-02 DIAGNOSIS — Z743 Need for continuous supervision: Secondary | ICD-10-CM | POA: Diagnosis not present

## 2022-11-02 DIAGNOSIS — E039 Hypothyroidism, unspecified: Secondary | ICD-10-CM | POA: Diagnosis present

## 2022-11-02 DIAGNOSIS — Z90710 Acquired absence of both cervix and uterus: Secondary | ICD-10-CM

## 2022-11-02 DIAGNOSIS — Z79899 Other long term (current) drug therapy: Secondary | ICD-10-CM

## 2022-11-02 DIAGNOSIS — F17210 Nicotine dependence, cigarettes, uncomplicated: Secondary | ICD-10-CM | POA: Diagnosis present

## 2022-11-02 DIAGNOSIS — Z818 Family history of other mental and behavioral disorders: Secondary | ICD-10-CM

## 2022-11-02 DIAGNOSIS — Z66 Do not resuscitate: Secondary | ICD-10-CM | POA: Diagnosis present

## 2022-11-02 DIAGNOSIS — E876 Hypokalemia: Secondary | ICD-10-CM | POA: Diagnosis present

## 2022-11-02 DIAGNOSIS — S0990XA Unspecified injury of head, initial encounter: Secondary | ICD-10-CM | POA: Diagnosis not present

## 2022-11-02 DIAGNOSIS — E034 Atrophy of thyroid (acquired): Secondary | ICD-10-CM

## 2022-11-02 DIAGNOSIS — Z8249 Family history of ischemic heart disease and other diseases of the circulatory system: Secondary | ICD-10-CM

## 2022-11-02 DIAGNOSIS — R296 Repeated falls: Secondary | ICD-10-CM | POA: Diagnosis present

## 2022-11-02 DIAGNOSIS — Z720 Tobacco use: Secondary | ICD-10-CM | POA: Diagnosis present

## 2022-11-02 DIAGNOSIS — I10 Essential (primary) hypertension: Secondary | ICD-10-CM | POA: Diagnosis present

## 2022-11-02 DIAGNOSIS — Z880 Allergy status to penicillin: Secondary | ICD-10-CM

## 2022-11-02 DIAGNOSIS — Z8673 Personal history of transient ischemic attack (TIA), and cerebral infarction without residual deficits: Secondary | ICD-10-CM

## 2022-11-02 DIAGNOSIS — J449 Chronic obstructive pulmonary disease, unspecified: Secondary | ICD-10-CM | POA: Insufficient documentation

## 2022-11-02 DIAGNOSIS — Z22322 Carrier or suspected carrier of Methicillin resistant Staphylococcus aureus: Secondary | ICD-10-CM

## 2022-11-02 DIAGNOSIS — I482 Chronic atrial fibrillation, unspecified: Secondary | ICD-10-CM | POA: Diagnosis present

## 2022-11-02 DIAGNOSIS — G40909 Epilepsy, unspecified, not intractable, without status epilepticus: Secondary | ICD-10-CM | POA: Diagnosis present

## 2022-11-02 DIAGNOSIS — Z952 Presence of prosthetic heart valve: Secondary | ICD-10-CM

## 2022-11-02 DIAGNOSIS — Z8541 Personal history of malignant neoplasm of cervix uteri: Secondary | ICD-10-CM

## 2022-11-02 DIAGNOSIS — Z888 Allergy status to other drugs, medicaments and biological substances status: Secondary | ICD-10-CM

## 2022-11-02 DIAGNOSIS — F419 Anxiety disorder, unspecified: Secondary | ICD-10-CM | POA: Insufficient documentation

## 2022-11-02 DIAGNOSIS — I350 Nonrheumatic aortic (valve) stenosis: Secondary | ICD-10-CM | POA: Diagnosis present

## 2022-11-02 DIAGNOSIS — Z716 Tobacco abuse counseling: Secondary | ICD-10-CM

## 2022-11-02 DIAGNOSIS — R0902 Hypoxemia: Secondary | ICD-10-CM | POA: Diagnosis not present

## 2022-11-02 LAB — CBC
Hematocrit: 40.1 % (ref 34.0–46.6)
Hemoglobin: 13.8 g/dL (ref 11.1–15.9)
MCH: 32.9 pg (ref 26.6–33.0)
MCHC: 34.4 g/dL (ref 31.5–35.7)
MCV: 96 fL (ref 79–97)
Platelets: 254 10*3/uL (ref 150–450)
RBC: 4.19 x10E6/uL (ref 3.77–5.28)
RDW: 11.8 % (ref 11.7–15.4)
WBC: 9.1 10*3/uL (ref 3.4–10.8)

## 2022-11-02 LAB — TSH: TSH: 5.37 u[IU]/mL — ABNORMAL HIGH (ref 0.450–4.500)

## 2022-11-02 LAB — T4, FREE: Free T4: 1.7 ng/dL (ref 0.82–1.77)

## 2022-11-02 MED ORDER — LACTATED RINGERS IV BOLUS
1000.0000 mL | Freq: Once | INTRAVENOUS | Status: AC
  Administered 2022-11-02: 1000 mL via INTRAVENOUS

## 2022-11-02 NOTE — ED Triage Notes (Signed)
Pt from home with fall after losing her balance. Pt now complaining of left hip and shoulder pain. Pt is on blood thinner.

## 2022-11-03 ENCOUNTER — Inpatient Hospital Stay (HOSPITAL_COMMUNITY): Payer: Medicare HMO | Admitting: Certified Registered"

## 2022-11-03 ENCOUNTER — Encounter (HOSPITAL_COMMUNITY)
Admission: EM | Disposition: A | Discharge status: Skilled Nursing Facility | Payer: Self-pay | Source: Home / Self Care | Attending: Internal Medicine

## 2022-11-03 ENCOUNTER — Inpatient Hospital Stay (HOSPITAL_COMMUNITY): Payer: Medicare HMO

## 2022-11-03 ENCOUNTER — Other Ambulatory Visit: Payer: Self-pay

## 2022-11-03 ENCOUNTER — Encounter (HOSPITAL_COMMUNITY): Payer: Self-pay | Admitting: Family Medicine

## 2022-11-03 DIAGNOSIS — Z66 Do not resuscitate: Secondary | ICD-10-CM | POA: Diagnosis not present

## 2022-11-03 DIAGNOSIS — Z8541 Personal history of malignant neoplasm of cervix uteri: Secondary | ICD-10-CM | POA: Diagnosis not present

## 2022-11-03 DIAGNOSIS — I482 Chronic atrial fibrillation, unspecified: Secondary | ICD-10-CM | POA: Diagnosis not present

## 2022-11-03 DIAGNOSIS — Z8249 Family history of ischemic heart disease and other diseases of the circulatory system: Secondary | ICD-10-CM | POA: Diagnosis not present

## 2022-11-03 DIAGNOSIS — I10 Essential (primary) hypertension: Secondary | ICD-10-CM

## 2022-11-03 DIAGNOSIS — E876 Hypokalemia: Secondary | ICD-10-CM

## 2022-11-03 DIAGNOSIS — G40909 Epilepsy, unspecified, not intractable, without status epilepticus: Secondary | ICD-10-CM

## 2022-11-03 DIAGNOSIS — E785 Hyperlipidemia, unspecified: Secondary | ICD-10-CM | POA: Diagnosis not present

## 2022-11-03 DIAGNOSIS — J439 Emphysema, unspecified: Secondary | ICD-10-CM | POA: Diagnosis not present

## 2022-11-03 DIAGNOSIS — Z7901 Long term (current) use of anticoagulants: Secondary | ICD-10-CM | POA: Diagnosis not present

## 2022-11-03 DIAGNOSIS — S72142A Displaced intertrochanteric fracture of left femur, initial encounter for closed fracture: Secondary | ICD-10-CM | POA: Diagnosis not present

## 2022-11-03 DIAGNOSIS — J410 Simple chronic bronchitis: Secondary | ICD-10-CM

## 2022-11-03 DIAGNOSIS — W010XXA Fall on same level from slipping, tripping and stumbling without subsequent striking against object, initial encounter: Secondary | ICD-10-CM | POA: Diagnosis not present

## 2022-11-03 DIAGNOSIS — J449 Chronic obstructive pulmonary disease, unspecified: Secondary | ICD-10-CM | POA: Insufficient documentation

## 2022-11-03 DIAGNOSIS — Z79899 Other long term (current) drug therapy: Secondary | ICD-10-CM | POA: Diagnosis not present

## 2022-11-03 DIAGNOSIS — F32A Depression, unspecified: Secondary | ICD-10-CM | POA: Insufficient documentation

## 2022-11-03 DIAGNOSIS — M25512 Pain in left shoulder: Secondary | ICD-10-CM | POA: Diagnosis not present

## 2022-11-03 DIAGNOSIS — F17200 Nicotine dependence, unspecified, uncomplicated: Secondary | ICD-10-CM

## 2022-11-03 DIAGNOSIS — D638 Anemia in other chronic diseases classified elsewhere: Secondary | ICD-10-CM | POA: Diagnosis not present

## 2022-11-03 DIAGNOSIS — E039 Hypothyroidism, unspecified: Secondary | ICD-10-CM

## 2022-11-03 DIAGNOSIS — F419 Anxiety disorder, unspecified: Secondary | ICD-10-CM | POA: Diagnosis not present

## 2022-11-03 DIAGNOSIS — R69 Illness, unspecified: Secondary | ICD-10-CM | POA: Diagnosis not present

## 2022-11-03 DIAGNOSIS — F17210 Nicotine dependence, cigarettes, uncomplicated: Secondary | ICD-10-CM | POA: Diagnosis not present

## 2022-11-03 DIAGNOSIS — R296 Repeated falls: Secondary | ICD-10-CM | POA: Diagnosis not present

## 2022-11-03 DIAGNOSIS — Z952 Presence of prosthetic heart valve: Secondary | ICD-10-CM | POA: Diagnosis not present

## 2022-11-03 DIAGNOSIS — I480 Paroxysmal atrial fibrillation: Secondary | ICD-10-CM | POA: Diagnosis not present

## 2022-11-03 DIAGNOSIS — I350 Nonrheumatic aortic (valve) stenosis: Secondary | ICD-10-CM | POA: Diagnosis not present

## 2022-11-03 DIAGNOSIS — Z90710 Acquired absence of both cervix and uterus: Secondary | ICD-10-CM | POA: Diagnosis not present

## 2022-11-03 DIAGNOSIS — Z79890 Hormone replacement therapy (postmenopausal): Secondary | ICD-10-CM | POA: Diagnosis not present

## 2022-11-03 DIAGNOSIS — Z22322 Carrier or suspected carrier of Methicillin resistant Staphylococcus aureus: Secondary | ICD-10-CM | POA: Diagnosis not present

## 2022-11-03 DIAGNOSIS — Z8673 Personal history of transient ischemic attack (TIA), and cerebral infarction without residual deficits: Secondary | ICD-10-CM | POA: Diagnosis not present

## 2022-11-03 HISTORY — PX: INTRAMEDULLARY (IM) NAIL INTERTROCHANTERIC: SHX5875

## 2022-11-03 LAB — CBC
HCT: 34.5 % — ABNORMAL LOW (ref 36.0–46.0)
Hemoglobin: 11.6 g/dL — ABNORMAL LOW (ref 12.0–15.0)
MCH: 32.8 pg (ref 26.0–34.0)
MCHC: 33.6 g/dL (ref 30.0–36.0)
MCV: 97.5 fL (ref 80.0–100.0)
Platelets: 201 10*3/uL (ref 150–400)
RBC: 3.54 MIL/uL — ABNORMAL LOW (ref 3.87–5.11)
RDW: 13.2 % (ref 11.5–15.5)
WBC: 9.9 10*3/uL (ref 4.0–10.5)
nRBC: 0 % (ref 0.0–0.2)

## 2022-11-03 LAB — BASIC METABOLIC PANEL
Anion gap: 9 (ref 5–15)
Anion gap: 6 (ref 5–15)
BUN/Creatinine Ratio: 23 (ref 12–28)
BUN: 19 mg/dL (ref 8–27)
BUN: 15 mg/dL (ref 8–23)
BUN: 12 mg/dL (ref 8–23)
CO2: 31 mmol/L — ABNORMAL HIGH (ref 20–29)
CO2: 32 mmol/L (ref 22–32)
CO2: 32 mmol/L (ref 22–32)
Calcium: 9.9 mg/dL (ref 8.7–10.3)
Calcium: 8.3 mg/dL — ABNORMAL LOW (ref 8.9–10.3)
Calcium: 8.1 mg/dL — ABNORMAL LOW (ref 8.9–10.3)
Chloride: 95 mmol/L — ABNORMAL LOW (ref 96–106)
Chloride: 98 mmol/L (ref 98–111)
Chloride: 101 mmol/L (ref 98–111)
Creatinine, Ser: 0.83 mg/dL (ref 0.57–1.00)
Creatinine, Ser: 0.63 mg/dL (ref 0.44–1.00)
Creatinine, Ser: 0.57 mg/dL (ref 0.44–1.00)
GFR, Estimated: >60 mL/min (ref >60)
GFR, Estimated: >60 mL/min (ref >60)
Glucose, Bld: 114 mg/dL — ABNORMAL HIGH (ref 70–99)
Glucose, Bld: 109 mg/dL — ABNORMAL HIGH (ref 70–99)
Glucose: 118 mg/dL — ABNORMAL HIGH (ref 70–99)
Potassium: 3.4 mmol/L — ABNORMAL LOW (ref 3.5–5.2)
Potassium: 2.8 mmol/L — ABNORMAL LOW (ref 3.5–5.1)
Potassium: 3 mmol/L — ABNORMAL LOW (ref 3.5–5.1)
Sodium: 143 mmol/L (ref 134–144)
Sodium: 139 mmol/L (ref 135–145)
Sodium: 139 mmol/L (ref 135–145)
eGFR: 74 mL/min/{1.73_m2} (ref >59)

## 2022-11-03 LAB — COMPREHENSIVE METABOLIC PANEL
ALT: 18 U/L (ref 0–44)
AST: 24 U/L (ref 15–41)
Albumin: 3 g/dL — ABNORMAL LOW (ref 3.5–5.0)
Alkaline Phosphatase: 80 U/L (ref 38–126)
Anion gap: 8 (ref 5–15)
BUN: 22 mg/dL (ref 8–23)
CO2: 33 mmol/L — ABNORMAL HIGH (ref 22–32)
Calcium: 8.6 mg/dL — ABNORMAL LOW (ref 8.9–10.3)
Chloride: 98 mmol/L (ref 98–111)
Creatinine, Ser: 0.71 mg/dL (ref 0.44–1.00)
GFR, Estimated: >60 mL/min (ref >60)
Glucose, Bld: 99 mg/dL (ref 70–99)
Potassium: 3 mmol/L — ABNORMAL LOW (ref 3.5–5.1)
Sodium: 139 mmol/L (ref 135–145)
Total Bilirubin: 0.3 mg/dL (ref 0.3–1.2)
Total Protein: 6.6 g/dL (ref 6.5–8.1)

## 2022-11-03 LAB — CBC WITH DIFFERENTIAL/PLATELET
Abs Immature Granulocytes: 0.03 10*3/uL (ref 0.00–0.07)
Basophils Absolute: 0 10*3/uL (ref 0.0–0.1)
Basophils Relative: 0 %
Eosinophils Absolute: 0.2 10*3/uL (ref 0.0–0.5)
Eosinophils Relative: 2 %
HCT: 36.2 % (ref 36.0–46.0)
Hemoglobin: 11.7 g/dL — ABNORMAL LOW (ref 12.0–15.0)
Immature Granulocytes: 0 %
Lymphocytes Relative: 29 %
Lymphs Abs: 2.6 10*3/uL (ref 0.7–4.0)
MCH: 32.6 pg (ref 26.0–34.0)
MCHC: 32.3 g/dL (ref 30.0–36.0)
MCV: 100.8 fL — ABNORMAL HIGH (ref 80.0–100.0)
Monocytes Absolute: 0.8 10*3/uL (ref 0.1–1.0)
Monocytes Relative: 9 %
Neutro Abs: 5.5 10*3/uL (ref 1.7–7.7)
Neutrophils Relative %: 60 %
Platelets: 218 10*3/uL (ref 150–400)
RBC: 3.59 MIL/uL — ABNORMAL LOW (ref 3.87–5.11)
RDW: 13.4 % (ref 11.5–15.5)
WBC: 9.1 10*3/uL (ref 4.0–10.5)
nRBC: 0 % (ref 0.0–0.2)

## 2022-11-03 LAB — MAGNESIUM: Magnesium: 2 mg/dL (ref 1.7–2.4)

## 2022-11-03 LAB — MRSA NEXT GEN BY PCR, NASAL: MRSA by PCR Next Gen: DETECTED — AB

## 2022-11-03 LAB — LAMOTRIGINE LEVEL: Lamotrigine Lvl: 6 ug/mL (ref 2.0–20.0)

## 2022-11-03 SURGERY — FIXATION, FRACTURE, INTERTROCHANTERIC, WITH INTRAMEDULLARY ROD
Anesthesia: General | Laterality: Left

## 2022-11-03 MED ORDER — POTASSIUM CHLORIDE IN NACL 20-0.9 MEQ/L-% IV SOLN
INTRAVENOUS | Status: AC
  Filled 2022-11-03: qty 1000

## 2022-11-03 MED ORDER — POLYETHYLENE GLYCOL 3350 17 G PO PACK
17.0000 g | PACK | Freq: Two times a day (BID) | ORAL | Status: AC
  Administered 2022-11-03 – 2022-11-04 (×2): 17 g via ORAL
  Filled 2022-11-03 (×2): qty 1

## 2022-11-03 MED ORDER — HYDROCORTISONE ACETATE 25 MG RE SUPP
25.0000 mg | Freq: Two times a day (BID) | RECTAL | Status: DC | PRN
  Administered 2022-11-07: 25 mg via RECTAL
  Filled 2022-11-03 (×2): qty 1

## 2022-11-03 MED ORDER — ONDANSETRON HCL 4 MG/2ML IJ SOLN: 4.0000 mg | Freq: Once | INTRAMUSCULAR | Status: DC | PRN

## 2022-11-03 MED ORDER — FLUTICASONE PROPIONATE 50 MCG/ACT NA SUSP: 2.0000 | Freq: Every day | NASAL | Status: DC | PRN

## 2022-11-03 MED ORDER — LACTATED RINGERS IV SOLN: INTRAVENOUS | Status: DC

## 2022-11-03 MED ORDER — FENTANYL CITRATE (PF) 100 MCG/2ML IJ SOLN
25.0000 ug | INTRAMUSCULAR | Status: DC | PRN
  Administered 2022-11-03: 25 ug via INTRAVENOUS

## 2022-11-03 MED ORDER — CALCIUM-MAGNESIUM 250-155 MG PO TABS: ORAL_TABLET | Freq: Every day | ORAL | Status: DC

## 2022-11-03 MED ORDER — HYDROCORTISONE 1 % EX CREA: 1.0000 | TOPICAL_CREAM | Freq: Two times a day (BID) | CUTANEOUS | Status: DC | PRN

## 2022-11-03 MED ORDER — POVIDONE-IODINE 10 % EX SWAB
2.0000 | Freq: Once | CUTANEOUS | Status: AC
  Administered 2022-11-03: 2 via TOPICAL

## 2022-11-03 MED ORDER — SODIUM CHLORIDE 0.9 % IV SOLN
750.0000 mg | Freq: Once | INTRAVENOUS | Status: AC
  Administered 2022-11-03: 750 mg via INTRAVENOUS
  Filled 2022-11-03: qty 7.5

## 2022-11-03 MED ORDER — VENLAFAXINE HCL ER 37.5 MG PO CP24
37.5000 mg | ORAL_CAPSULE | Freq: Every day | ORAL | Status: DC
  Administered 2022-11-04 – 2022-11-08 (×5): 37.5 mg via ORAL
  Filled 2022-11-03 (×6): qty 1

## 2022-11-03 MED ORDER — SUGAMMADEX SODIUM 200 MG/2ML IV SOLN
INTRAVENOUS | Status: DC | PRN
  Administered 2022-11-03 (×2): 60 mg via INTRAVENOUS

## 2022-11-03 MED ORDER — ONDANSETRON HCL 4 MG/2ML IJ SOLN: 4.0000 mg | INTRAMUSCULAR | Status: DC | PRN

## 2022-11-03 MED ORDER — BISOPROLOL FUMARATE 10 MG PO TABS
20.0000 mg | ORAL_TABLET | Freq: Every day | ORAL | Status: DC
  Administered 2022-11-03 – 2022-11-08 (×5): 20 mg via ORAL
  Filled 2022-11-03 (×7): qty 2

## 2022-11-03 MED ORDER — METHOCARBAMOL 500 MG PO TABS
500.0000 mg | ORAL_TABLET | Freq: Four times a day (QID) | ORAL | Status: DC | PRN
  Administered 2022-11-04 – 2022-11-07 (×3): 500 mg via ORAL
  Filled 2022-11-03 (×3): qty 1

## 2022-11-03 MED ORDER — ONDANSETRON HCL 4 MG/2ML IJ SOLN
INTRAMUSCULAR | Status: DC | PRN
  Administered 2022-11-03: 4 mg via INTRAVENOUS

## 2022-11-03 MED ORDER — PHENYLEPHRINE HCL-NACL 20-0.9 MG/250ML-% IV SOLN
INTRAVENOUS | Status: DC | PRN
  Administered 2022-11-03: 30 ug/min via INTRAVENOUS

## 2022-11-03 MED ORDER — CHLORHEXIDINE GLUCONATE 0.12 % MT SOLN: 15.0000 mL | Freq: Once | OROMUCOSAL | Status: AC

## 2022-11-03 MED ORDER — CEFAZOLIN SODIUM-DEXTROSE 2-4 GM/100ML-% IV SOLN
2.0000 g | INTRAVENOUS | Status: AC
  Administered 2022-11-03: 2 g via INTRAVENOUS
  Filled 2022-11-03: qty 100

## 2022-11-03 MED ORDER — VITAMIN D 25 MCG (1000 UNIT) PO TABS
2000.0000 [IU] | ORAL_TABLET | Freq: Every day | ORAL | Status: DC
  Administered 2022-11-03 – 2022-11-08 (×6): 2000 [IU] via ORAL
  Filled 2022-11-03 (×6): qty 2

## 2022-11-03 MED ORDER — ALBUTEROL SULFATE (2.5 MG/3ML) 0.083% IN NEBU: 3.0000 mL | INHALATION_SOLUTION | Freq: Four times a day (QID) | RESPIRATORY_TRACT | Status: DC | PRN

## 2022-11-03 MED ORDER — FENTANYL CITRATE (PF) 100 MCG/2ML IJ SOLN
INTRAMUSCULAR | Status: AC
  Filled 2022-11-03: qty 2

## 2022-11-03 MED ORDER — PROSIGHT PO TABS
1.0000 | ORAL_TABLET | Freq: Two times a day (BID) | ORAL | Status: DC
  Administered 2022-11-03 – 2022-11-08 (×10): 1 via ORAL
  Filled 2022-11-03 (×14): qty 1

## 2022-11-03 MED ORDER — POTASSIUM CHLORIDE 10 MEQ/100ML IV SOLN
10.0000 meq | Freq: Once | INTRAVENOUS | Status: AC
  Administered 2022-11-03: 10 meq via INTRAVENOUS
  Filled 2022-11-03: qty 100

## 2022-11-03 MED ORDER — MIDAZOLAM HCL 2 MG/2ML IJ SOLN
INTRAMUSCULAR | Status: AC
  Filled 2022-11-03: qty 2

## 2022-11-03 MED ORDER — ACETAMINOPHEN 10 MG/ML IV SOLN
1000.0000 mg | Freq: Once | INTRAVENOUS | Status: DC | PRN
  Administered 2022-11-03: 1000 mg via INTRAVENOUS

## 2022-11-03 MED ORDER — POLYETHYLENE GLYCOL 3350 17 G PO PACK: 17.0000 g | PACK | Freq: Two times a day (BID) | ORAL | Status: DC | PRN

## 2022-11-03 MED ORDER — ONDANSETRON HCL 4 MG/2ML IJ SOLN
INTRAMUSCULAR | Status: AC
  Filled 2022-11-03: qty 2

## 2022-11-03 MED ORDER — LAMOTRIGINE 100 MG PO TABS
100.0000 mg | ORAL_TABLET | Freq: Two times a day (BID) | ORAL | Status: DC
  Administered 2022-11-03 – 2022-11-08 (×10): 100 mg via ORAL
  Filled 2022-11-03 (×10): qty 1

## 2022-11-03 MED ORDER — ENSURE ENLIVE PO LIQD
237.0000 mL | Freq: Two times a day (BID) | ORAL | Status: DC
  Administered 2022-11-04 – 2022-11-08 (×10): 237 mL via ORAL

## 2022-11-03 MED ORDER — POTASSIUM CHLORIDE CRYS ER 10 MEQ PO TBCR
10.0000 meq | EXTENDED_RELEASE_TABLET | Freq: Every day | ORAL | Status: DC
  Administered 2022-11-03 – 2022-11-08 (×6): 10 meq via ORAL
  Filled 2022-11-03 (×6): qty 1

## 2022-11-03 MED ORDER — ROCURONIUM BROMIDE 10 MG/ML (PF) SYRINGE
PREFILLED_SYRINGE | INTRAVENOUS | Status: AC
  Filled 2022-11-03: qty 10

## 2022-11-03 MED ORDER — LEVETIRACETAM 750 MG PO TABS
750.0000 mg | ORAL_TABLET | Freq: Two times a day (BID) | ORAL | Status: DC
  Filled 2022-11-03: qty 1

## 2022-11-03 MED ORDER — POTASSIUM CHLORIDE IN NACL 20-0.9 MEQ/L-% IV SOLN
INTRAVENOUS | Status: DC
  Filled 2022-11-03: qty 1000

## 2022-11-03 MED ORDER — LIDOCAINE 2% (20 MG/ML) 5 ML SYRINGE
INTRAMUSCULAR | Status: DC | PRN
  Administered 2022-11-03: 40 mg via INTRAVENOUS

## 2022-11-03 MED ORDER — CEFAZOLIN SODIUM-DEXTROSE 1-4 GM/50ML-% IV SOLN
1.0000 g | Freq: Three times a day (TID) | INTRAVENOUS | Status: AC
  Administered 2022-11-03 – 2022-11-04 (×2): 1 g via INTRAVENOUS
  Filled 2022-11-03 (×2): qty 50

## 2022-11-03 MED ORDER — PROPOFOL 10 MG/ML IV BOLUS
INTRAVENOUS | Status: AC
  Filled 2022-11-03: qty 20

## 2022-11-03 MED ORDER — ORAL CARE MOUTH RINSE: 15.0000 mL | Freq: Once | OROMUCOSAL | Status: AC

## 2022-11-03 MED ORDER — ACETAMINOPHEN 325 MG PO TABS
650.0000 mg | ORAL_TABLET | ORAL | Status: DC | PRN
  Administered 2022-11-04 – 2022-11-06 (×2): 650 mg via ORAL
  Filled 2022-11-03 (×2): qty 2

## 2022-11-03 MED ORDER — PHENYLEPHRINE 80 MCG/ML (10ML) SYRINGE FOR IV PUSH (FOR BLOOD PRESSURE SUPPORT)
PREFILLED_SYRINGE | INTRAVENOUS | Status: DC | PRN
  Administered 2022-11-03: 160 ug via INTRAVENOUS
  Administered 2022-11-03: 180 ug via INTRAVENOUS
  Administered 2022-11-03 (×2): 160 ug via INTRAVENOUS

## 2022-11-03 MED ORDER — MORPHINE SULFATE (PF) 2 MG/ML IV SOLN
0.5000 mg | INTRAVENOUS | Status: DC | PRN
  Administered 2022-11-03 (×3): 0.5 mg via INTRAVENOUS
  Filled 2022-11-03 (×3): qty 1

## 2022-11-03 MED ORDER — BISOPROLOL FUMARATE 10 MG PO TABS: 20.0000 mg | ORAL_TABLET | Freq: Every day | ORAL | Status: DC

## 2022-11-03 MED ORDER — CHLORHEXIDINE GLUCONATE 4 % EX LIQD: 60.0000 mL | Freq: Once | CUTANEOUS | Status: DC

## 2022-11-03 MED ORDER — TRAZODONE HCL 50 MG PO TABS
25.0000 mg | ORAL_TABLET | Freq: Every evening | ORAL | Status: DC | PRN
  Administered 2022-11-06: 25 mg via ORAL
  Filled 2022-11-03: qty 1

## 2022-11-03 MED ORDER — DEXAMETHASONE SODIUM PHOSPHATE 10 MG/ML IJ SOLN
INTRAMUSCULAR | Status: AC
  Filled 2022-11-03: qty 1

## 2022-11-03 MED ORDER — OMEGA-3-ACID ETHYL ESTERS 1 G PO CAPS
1.0000 | ORAL_CAPSULE | Freq: Two times a day (BID) | ORAL | Status: DC
  Administered 2022-11-03 – 2022-11-08 (×11): 1 g via ORAL
  Filled 2022-11-03 (×12): qty 1

## 2022-11-03 MED ORDER — METHOCARBAMOL 1000 MG/10ML IJ SOLN: 500.0000 mg | Freq: Four times a day (QID) | INTRAVENOUS | Status: DC | PRN

## 2022-11-03 MED ORDER — LEVETIRACETAM 750 MG PO TABS
750.0000 mg | ORAL_TABLET | Freq: Two times a day (BID) | ORAL | Status: DC
  Administered 2022-11-03 – 2022-11-08 (×10): 750 mg via ORAL
  Filled 2022-11-03 (×12): qty 1

## 2022-11-03 MED ORDER — LORATADINE 10 MG PO TABS
10.0000 mg | ORAL_TABLET | Freq: Every day | ORAL | Status: DC
  Administered 2022-11-03 – 2022-11-08 (×6): 10 mg via ORAL
  Filled 2022-11-03 (×6): qty 1

## 2022-11-03 MED ORDER — TURMERIC 500 MG PO CAPS: ORAL_CAPSULE | Freq: Two times a day (BID) | ORAL | Status: DC

## 2022-11-03 MED ORDER — MAGNESIUM HYDROXIDE 400 MG/5ML PO SUSP: 30.0000 mL | Freq: Every day | ORAL | Status: DC | PRN

## 2022-11-03 MED ORDER — ROCURONIUM BROMIDE 10 MG/ML (PF) SYRINGE
PREFILLED_SYRINGE | INTRAVENOUS | Status: DC | PRN
  Administered 2022-11-03: 30 mg via INTRAVENOUS

## 2022-11-03 MED ORDER — SODIUM CHLORIDE 0.9 % IV BOLUS
500.0000 mL | Freq: Once | INTRAVENOUS | Status: AC
  Administered 2022-11-03: 500 mL via INTRAVENOUS

## 2022-11-03 MED ORDER — DEXAMETHASONE SODIUM PHOSPHATE 10 MG/ML IJ SOLN
INTRAMUSCULAR | Status: DC | PRN
  Administered 2022-11-03: 5 mg via INTRAVENOUS

## 2022-11-03 MED ORDER — FENTANYL CITRATE (PF) 250 MCG/5ML IJ SOLN
INTRAMUSCULAR | Status: DC | PRN
  Administered 2022-11-03: 100 ug via INTRAVENOUS

## 2022-11-03 MED ORDER — CALCIUM CARBONATE 1250 (500 CA) MG PO TABS
1.0000 | ORAL_TABLET | Freq: Every day | ORAL | Status: DC
  Administered 2022-11-04 – 2022-11-08 (×5): 1250 mg via ORAL
  Filled 2022-11-03 (×7): qty 1

## 2022-11-03 MED ORDER — ADULT MULTIVITAMIN W/MINERALS CH
1.0000 | ORAL_TABLET | Freq: Every day | ORAL | Status: DC
  Administered 2022-11-03 – 2022-11-08 (×6): 1 via ORAL
  Filled 2022-11-03 (×6): qty 1

## 2022-11-03 MED ORDER — ACETAMINOPHEN 10 MG/ML IV SOLN
INTRAVENOUS | Status: AC
  Filled 2022-11-03: qty 100

## 2022-11-03 MED ORDER — MAGNESIUM OXIDE -MG SUPPLEMENT 400 (240 MG) MG PO TABS
400.0000 mg | ORAL_TABLET | Freq: Every day | ORAL | Status: DC
  Administered 2022-11-03 – 2022-11-08 (×6): 400 mg via ORAL
  Filled 2022-11-03 (×6): qty 1

## 2022-11-03 MED ORDER — AMISULPRIDE (ANTIEMETIC) 5 MG/2ML IV SOLN: 10.0000 mg | Freq: Once | INTRAVENOUS | Status: DC | PRN

## 2022-11-03 MED ORDER — MAGNESIUM SULFATE 2 GM/50ML IV SOLN
2.0000 g | Freq: Once | INTRAVENOUS | Status: DC
  Administered 2022-11-03: 2 g via INTRAVENOUS
  Filled 2022-11-03: qty 50

## 2022-11-03 MED ORDER — LIDOCAINE 2% (20 MG/ML) 5 ML SYRINGE
INTRAMUSCULAR | Status: AC
  Filled 2022-11-03: qty 5

## 2022-11-03 MED ORDER — LEVOTHYROXINE SODIUM 88 MCG PO TABS
88.0000 ug | ORAL_TABLET | Freq: Every day | ORAL | Status: DC
  Administered 2022-11-04 – 2022-11-08 (×5): 88 ug via ORAL
  Filled 2022-11-03 (×5): qty 1

## 2022-11-03 MED ORDER — POLYVINYL ALCOHOL 1.4 % OP SOLN: Freq: Every day | OPHTHALMIC | Status: DC | PRN

## 2022-11-03 MED ORDER — FENTANYL CITRATE (PF) 250 MCG/5ML IJ SOLN
INTRAMUSCULAR | Status: AC
  Filled 2022-11-03: qty 5

## 2022-11-03 MED ORDER — POTASSIUM CHLORIDE CRYS ER 20 MEQ PO TBCR
40.0000 meq | EXTENDED_RELEASE_TABLET | Freq: Once | ORAL | Status: DC
  Filled 2022-11-03: qty 2

## 2022-11-03 MED ORDER — CHLORHEXIDINE GLUCONATE 0.12 % MT SOLN
OROMUCOSAL | Status: AC
  Administered 2022-11-03: 15 mL via OROMUCOSAL
  Filled 2022-11-03: qty 15

## 2022-11-03 MED ORDER — ATORVASTATIN CALCIUM 40 MG PO TABS
40.0000 mg | ORAL_TABLET | Freq: Every day | ORAL | Status: DC
  Administered 2022-11-03 – 2022-11-08 (×6): 40 mg via ORAL
  Filled 2022-11-03 (×6): qty 1

## 2022-11-03 MED ORDER — PROPOFOL 10 MG/ML IV BOLUS
INTRAVENOUS | Status: DC | PRN
  Administered 2022-11-03: 50 mg via INTRAVENOUS

## 2022-11-03 MED ORDER — FUROSEMIDE 20 MG PO TABS
20.0000 mg | ORAL_TABLET | Freq: Every day | ORAL | Status: DC
  Administered 2022-11-03 – 2022-11-04 (×2): 20 mg via ORAL
  Filled 2022-11-03 (×2): qty 1

## 2022-11-03 MED ORDER — HYDROCODONE-ACETAMINOPHEN 5-325 MG PO TABS
1.0000 | ORAL_TABLET | Freq: Four times a day (QID) | ORAL | Status: DC | PRN
  Administered 2022-11-03 – 2022-11-04 (×2): 1 via ORAL
  Administered 2022-11-04 – 2022-11-05 (×2): 2 via ORAL
  Administered 2022-11-06 – 2022-11-08 (×4): 1 via ORAL
  Filled 2022-11-03: qty 1
  Filled 2022-11-03: qty 2
  Filled 2022-11-03 (×2): qty 1
  Filled 2022-11-03: qty 2
  Filled 2022-11-03 (×2): qty 1
  Filled 2022-11-03: qty 2

## 2022-11-03 MED ORDER — BISOPROLOL FUMARATE 5 MG PO TABS
15.0000 mg | ORAL_TABLET | Freq: Every day | ORAL | Status: DC
  Filled 2022-11-03: qty 1

## 2022-11-03 MED ORDER — LAMOTRIGINE 25 MG PO TABS: 25.0000 mg | ORAL_TABLET | ORAL | Status: DC

## 2022-11-03 MED ORDER — ESMOLOL HCL 100 MG/10ML IV SOLN
INTRAVENOUS | Status: DC | PRN
  Administered 2022-11-03: 20 mg via INTRAVENOUS
  Administered 2022-11-03: 30 mg via INTRAVENOUS

## 2022-11-03 MED ORDER — TRAZODONE HCL 50 MG PO TABS: 50.0000 mg | ORAL_TABLET | Freq: Every evening | ORAL | Status: DC | PRN

## 2022-11-03 SURGICAL SUPPLY — 59 items
BAG COUNTER SPONGE SURGICOUNT (BAG) IMPLANT
BAG SPNG CNTER NS LX DISP (BAG)
BIT DRILL 4.0X280 (BIT) IMPLANT
BLADE SURG 15 STRL LF DISP TIS (BLADE) ×1 IMPLANT
BLADE SURG 15 STRL SS (BLADE) ×1
BNDG CMPR MED 10X6 ELC LF (GAUZE/BANDAGES/DRESSINGS) ×1
BNDG COHESIVE 4X5 TAN STRL (GAUZE/BANDAGES/DRESSINGS) ×1 IMPLANT
BNDG COHESIVE 6X5 TAN NS LF (GAUZE/BANDAGES/DRESSINGS) IMPLANT
BNDG ELASTIC 6X10 VLCR STRL LF (GAUZE/BANDAGES/DRESSINGS) ×1 IMPLANT
COVER SURGICAL LIGHT HANDLE (MISCELLANEOUS) ×2 IMPLANT
DRAPE C-ARM 42X72 X-RAY (DRAPES) ×1 IMPLANT
DRAPE C-ARMOR (DRAPES) ×1 IMPLANT
DRAPE IMP U-DRAPE 54X76 (DRAPES) ×1 IMPLANT
DRAPE INCISE IOBAN 66X45 STRL (DRAPES) IMPLANT
DRAPE ORTHO SPLIT 77X108 STRL (DRAPES)
DRAPE SURG ORHT 6 SPLT 77X108 (DRAPES) IMPLANT
DRAPE U-SHAPE 47X51 STRL (DRAPES) ×1 IMPLANT
DRAPE UNIVERSAL PACK (DRAPES) ×1 IMPLANT
DRESSING MEPILEX FLEX 4X4 (GAUZE/BANDAGES/DRESSINGS) IMPLANT
DRSG MEPILEX FLEX 4X4 (GAUZE/BANDAGES/DRESSINGS) ×2
DRSG MEPILEX POST OP 4X12 (GAUZE/BANDAGES/DRESSINGS) IMPLANT
DURAPREP 26ML APPLICATOR (WOUND CARE) ×1 IMPLANT
ELECT REM PT RETURN 9FT ADLT (ELECTROSURGICAL) ×1
ELECTRODE REM PT RTRN 9FT ADLT (ELECTROSURGICAL) ×1 IMPLANT
FACESHIELD OPICON LG (MASK) IMPLANT
GAUZE SPONGE 4X4 12PLY STRL (GAUZE/BANDAGES/DRESSINGS) ×1 IMPLANT
GAUZE XEROFORM 1X8 LF (GAUZE/BANDAGES/DRESSINGS) ×2 IMPLANT
GLOVE BIOGEL M STRL SZ7.5 (GLOVE) ×1 IMPLANT
GLOVE BIOGEL PI IND STRL 8 (GLOVE) ×1 IMPLANT
GOWN STRL REUS W/ TWL LRG LVL3 (GOWN DISPOSABLE) ×1 IMPLANT
GOWN STRL REUS W/ TWL XL LVL3 (GOWN DISPOSABLE) ×1 IMPLANT
GOWN STRL REUS W/TWL LRG LVL3 (GOWN DISPOSABLE) ×1
GOWN STRL REUS W/TWL XL LVL3 (GOWN DISPOSABLE) ×1
KIT BASIN OR (CUSTOM PROCEDURE TRAY) ×1 IMPLANT
KIT TURNOVER KIT B (KITS) ×1 IMPLANT
NAIL 9X130X20 SHORT (Nail) IMPLANT
PACK ORTHO EXTREMITY (CUSTOM PROCEDURE TRAY) ×1 IMPLANT
PACK UNIVERSAL I (CUSTOM PROCEDURE TRAY) ×1 IMPLANT
PAD ABD 8X10 STRL (GAUZE/BANDAGES/DRESSINGS) ×2 IMPLANT
PAD ARMBOARD 7.5X6 YLW CONV (MISCELLANEOUS) ×1 IMPLANT
PAD CAST 4YDX4 CTTN HI CHSV (CAST SUPPLIES) ×1 IMPLANT
PADDING CAST COTTON 4X4 STRL (CAST SUPPLIES) ×1
PADDING CAST COTTON 6X4 STRL (CAST SUPPLIES) ×1 IMPLANT
PIN GUIDE THRD AR 3.2X330 (PIN) IMPLANT
SCREW LOCK CORT 5X36 (Screw) IMPLANT
SCREW LOCK LAG FEM 10.5X90 (Screw) IMPLANT
SPONGE LAP 18X18 X RAY DECT (DISPOSABLE) ×1 IMPLANT
STAPLER VISISTAT 35W (STAPLE) IMPLANT
STOCKINETTE IMPERVIOUS 9X36 MD (GAUZE/BANDAGES/DRESSINGS) ×1 IMPLANT
SUT ETHILON 2 0 FS 18 (SUTURE) IMPLANT
SUT MON AB 2-0 CT1 27 (SUTURE) ×1 IMPLANT
SUT MON AB 3-0 SH 27 (SUTURE) ×1
SUT MON AB 3-0 SH27 (SUTURE) IMPLANT
SUT VIC AB 2-0 CT1 18 (SUTURE) IMPLANT
TOOL ACTIVATION (INSTRUMENTS) IMPLANT
TOWEL GREEN STERILE (TOWEL DISPOSABLE) ×1 IMPLANT
TOWEL GREEN STERILE FF (TOWEL DISPOSABLE) ×1 IMPLANT
TUBE CONNECTING 12X1/4 (SUCTIONS) ×1 IMPLANT
YANKAUER SUCT BULB TIP NO VENT (SUCTIONS) ×1 IMPLANT

## 2022-11-03 NOTE — Assessment & Plan Note (Addendum)
-   We will continue her Zebeta and hold off Eliquis for expected operative intervention.  Ventricular response is currently controlled. - We will optimize her electrolytes.

## 2022-11-03 NOTE — Assessment & Plan Note (Signed)
-   The Patient will be transferred to [facility] to surgical telemetry bed. - Pain management will be provided. - Orthopedic consultation will be obtained. - Dr. Doreatha Martin was notified and is aware about the patient. - We will hold off Eliquis. -The patient has a history of CVA with no history of CHF, CAD, diabetes mellitus on insulin, or renal failure with a creatinine more than 2.  She is considered above average risk for age for perioperative cardiovascular events per the revised cardiac risk index.  She does have a history of COPD without chronic active/acute pulmonary issues.

## 2022-11-03 NOTE — Progress Notes (Deleted)
rx

## 2022-11-03 NOTE — Anesthesia Postprocedure Evaluation (Signed)
Anesthesia Post Note  Patient: Tammie Martin  Procedure(s) Performed: INTRAMEDULLARY (IM) NAIL INTERTROCHANTERIC (Left)     Patient location during evaluation: PACU Anesthesia Type: General Level of consciousness: awake Pain management: pain level controlled Vital Signs Assessment: post-procedure vital signs reviewed and stable Respiratory status: spontaneous breathing, nonlabored ventilation and respiratory function stable Cardiovascular status: blood pressure returned to baseline and stable Postop Assessment: no apparent nausea or vomiting Anesthetic complications: no   No notable events documented.  Last Vitals:  Vitals:   11/03/22 1745 11/03/22 1925  BP: 109/73 (!) 90/58  Pulse:  93  Resp:  (!) 25  Temp:  36.5 C  SpO2:  91%    Last Pain:  Vitals:   11/03/22 1925  TempSrc: Oral  PainSc:                  Rigby Swamy P Caleah Tortorelli

## 2022-11-03 NOTE — Assessment & Plan Note (Addendum)
-   We will continue statin therapy which should offer cardiovascular risk reduction as well as Lovaza.

## 2022-11-03 NOTE — Transfer of Care (Signed)
Immediate Anesthesia Transfer of Care Note  Patient: Tammie Martin  Procedure(s) Performed: INTRAMEDULLARY (IM) NAIL INTERTROCHANTERIC (Left)  Patient Location: PACU  Anesthesia Type:General  Level of Consciousness: awake and patient cooperative  Airway & Oxygen Therapy: Patient Spontanous Breathing and Patient connected to face mask oxygen  Post-op Assessment: Report given to RN, Post -op Vital signs reviewed and stable, and    Post vital signs: Reviewed and stable  Last Vitals:  Vitals Value Taken Time  BP 109/57 11/03/22 1341  Temp    Pulse 97 11/03/22 1342  Resp 17 11/03/22 1342  SpO2 99 % 11/03/22 1342  Vitals shown include unvalidated device data.  Last Pain:  Vitals:   11/03/22 1121  TempSrc:   PainSc: 0-No pain         Complications: No notable events documented.

## 2022-11-03 NOTE — Progress Notes (Signed)
PHARMACIST - PHYSICIAN ORDER COMMUNICATION  CONCERNING: P&T Medication Policy on Herbal Medications  DESCRIPTION:  This patient's order for:  turmeric  has been noted.  This product(s) is classified as an "herbal" or natural product. Due to a lack of definitive safety studies or FDA approval, nonstandard manufacturing practices, plus the potential risk of unknown drug-drug interactions while on inpatient medications, the Pharmacy and Therapeutics Committee does not permit the use of "herbal" or natural products of this type within [facility].   ACTION TAKEN: The pharmacy department is unable to verify this order at this time and your patient has been informed of this safety policy. Please reevaluate patient's clinical condition at discharge and address if the herbal or natural product(s) should be resumed at that time.   Dolly Rias RPh 11/03/2022, 1:31 AM

## 2022-11-03 NOTE — Assessment & Plan Note (Signed)
-   We will continue Synthroid. 

## 2022-11-03 NOTE — Consult Note (Signed)
Reason for Consult:Left hip fx Referring Physician: Charlynne Martin Time called: 0730 Time at bedside: [name] is an 72 y.o. female.  HPI: Tammie Martin was standing beside her bed when she lost balance and fell. She had immediate left hip pain and could not get up. She was brought to the ED where x-rays showed a left hip fx and orthopedic surgery was consulted. Her daughter lives with her since a stroke last year and she occasionally uses a RW to ambulate.  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 CBurt Knackand Dr. BCyndia Bent  Seizures ([facility]    Severe aortic stenosis    Stroke ([facility]    Substance abuse ([facility]    Thyroid disease    Tobacco abuse     Past Surgical History:  Procedure Laterality Date   AORTIC VALVE REPLACEMENT     APPENDECTOMY     pt recently had CT that said Appendix was there so she is unsure if this was actually removed during hysterectomy as she was previously told   BREAST ENHANCEMENT SURGERY     BREAST SURGERY     CARDIAC VALVE REPLACEMENT     CARDIOVERSION N/A 10/27/2021   Procedure: CARDIOVERSION;  Surgeon: BArnoldo Lenis MD;  Location: [facility];  Service: Endoscopy;  Laterality: N/A;   INTRAOPERATIVE TRANSESOPHAGEAL ECHOCARDIOGRAM N/A 02/01/2022   Procedure: INTRAOPERATIVE TRANSESOPHAGEAL ECHOCARDIOGRAM;  Surgeon: CSherren Mocha MD;  [place] [facility]  Service: Open Heart Surgery;  Laterality: N/A;   RIGHT/LEFT HEART CATH AND CORONARY ANGIOGRAPHY N/A 11/22/2021   Procedure:  RIGHT/LEFT HEART CATH AND CORONARY ANGIOGRAPHY;  Surgeon: CSherren Mocha MD;  [facility];  Service: Cardiovascular;  Laterality: N/A;   TONSILLECTOMY     VAGINAL HYSTERECTOMY      Family History  Problem Relation Age of Onset   Cancer Mother        breast cancer at 339   Kidney disease Mother    Early death Mother    Cancer Maternal Aunt        colon cancer   Heart attack Father    Early death Father    Heart disease Father    Diabetes Sister    Obesity Sister    Depression Sister    Arthritis Sister    Alcohol abuse Brother    Cirrhosis Brother    Depression Brother    Liver disease Brother    Drug abuse Brother    Early death Brother    Diabetes Daughter    Fibromyalgia Daughter    Depression Daughter    Anxiety disorder Daughter    Arthritis Daughter    Depression Sister    Obesity Sister    Diabetes Sister    Early death Sister     Social History:  reports that she has been smoking cigarettes. She has a 40.00 pack-year smoking history. She has  never used smokeless tobacco. She reports that she does not currently use alcohol. She reports that she does not use drugs.  Allergies:  Allergies  Allergen Reactions   Calcium Channel Blockers    Penicillins Hives   Singulair [Montelukast Sodium]     headache   Chantix [Varenicline] Nausea Only    GI intolerance with higher doses   Codeine Rash   Nicoderm [Nicotine] Hives and Swelling   Other Rash    Band-aids cause rash after a few days    Medications: I have reviewed the patient's current medications.  Results for orders placed or performed during the hospital encounter of 11/02/22 (from the past 48 hour(s))  CBC with Differential     Status: Abnormal   Collection Time: 11/02/22 11:38 PM  Result Value Ref Range   WBC 9.1 4.0 - 10.5 K/uL   RBC 3.59 (L) 3.87 - 5.11 MIL/uL   Hemoglobin 11.7 (L) 12.0 - 15.0 g/dL   HCT 36.2 36.0 - 46.0 %   MCV 100.8 (H) 80.0 - 100.0 fL   MCH 32.6 26.0 - 34.0  pg   MCHC 32.3 30.0 - 36.0 g/dL   RDW 13.4 11.5 - 15.5 %   Platelets 218 150 - 400 K/uL   nRBC 0.0 0.0 - 0.2 %   Neutrophils Relative % 60 %   Neutro Abs 5.5 1.7 - 7.7 K/uL   Lymphocytes Relative 29 %   Lymphs Abs 2.6 0.7 - 4.0 K/uL   Monocytes Relative 9 %   Monocytes Absolute 0.8 0.1 - 1.0 K/uL   Eosinophils Relative 2 %   Eosinophils Absolute 0.2 0.0 - 0.5 K/uL   Basophils Relative 0 %   Basophils Absolute 0.0 0.0 - 0.1 K/uL   Immature Granulocytes 0 %   Abs Immature Granulocytes 0.03 0.00 - 0.07 K/uL    Comment: Performed at [facility], [address], [place], [place] [address]  Comprehensive metabolic panel     Status: Abnormal   Collection Time: 11/02/22 11:38 PM  Result Value Ref Range   Sodium 139 135 - 145 mmol/L   Potassium 3.0 (L) 3.5 - 5.1 mmol/L   Chloride 98 98 - 111 mmol/L   CO2 33 (H) 22 - 32 mmol/L   Glucose, Bld 99 70 - 99 mg/dL    Comment: Glucose reference range applies only to samples taken after fasting for at least 8 hours.   BUN 22 8 - 23 mg/dL   Creatinine, Ser 0.71 0.44 - 1.00 mg/dL   Calcium 8.6 (L) 8.9 - 10.3 mg/dL   Total Protein 6.6 6.5 - 8.1 g/dL   Albumin 3.0 (L) 3.5 - 5.0 g/dL   AST 24 15 - 41 U/L   ALT 18 0 - 44 U/L   Alkaline Phosphatase 80 38 - 126 U/L   Total Bilirubin 0.3 0.3 - 1.2 mg/dL   GFR, Estimated >60 >60 mL/min    Comment: (NOTE) Calculated using the CKD-EPI Creatinine Equation (2021)    Anion gap 8 5 - 15    Comment: Performed at [facility], [address], [place], [place] [address]  Magnesium     Status: None   Collection Time: 11/02/22 11:38 PM  Result Value Ref Range   Magnesium 2.0 1.7 - 2.4 mg/dL    Comment: Performed at [facility], [address], [place], [place] [id number]  Basic metabolic panel     Status: Abnormal   Collection Time: 11/03/22  4:21 AM  Result Value Ref Range  Sodium 139 135 - 145 mmol/L   Potassium 2.8 (L) 3.5 - 5.1 mmol/L   Chloride 98 98 - 111 mmol/L   CO2 32 22 - 32 mmol/L   Glucose,  Bld 114 (H) 70 - 99 mg/dL    Comment: Glucose reference range applies only to samples taken after fasting for at least 8 hours.   BUN 15 8 - 23 mg/dL   Creatinine, Ser 0.63 0.44 - 1.00 mg/dL   Calcium 8.3 (L) 8.9 - 10.3 mg/dL   GFR, Estimated >60 >60 mL/min    Comment: (NOTE) Calculated using the CKD-EPI Creatinine Equation (2021)    Anion gap 9 5 - 15    Comment: Performed at [facility] [address] [place], [place] [address]  CBC     Status: Abnormal   Collection Time: 11/03/22  4:21 AM  Result Value Ref Range   WBC 9.9 4.0 - 10.5 K/uL   RBC 3.54 (L) 3.87 - 5.11 MIL/uL   Hemoglobin 11.6 (L) 12.0 - 15.0 g/dL   HCT 34.5 (L) 36.0 - 46.0 %   MCV 97.5 80.0 - 100.0 fL   MCH 32.8 26.0 - 34.0 pg   MCHC 33.6 30.0 - 36.0 g/dL   RDW 13.2 11.5 - 15.5 %   Platelets 201 150 - 400 K/uL   nRBC 0.0 0.0 - 0.2 %    Comment: Performed at [facility], [place] [address], [place], [place] [id number]  Basic metabolic panel     Status: Abnormal   Collection Time: 11/03/22  8:04 AM  Result Value Ref Range   Sodium 139 135 - 145 mmol/L   Potassium 3.0 (L) 3.5 - 5.1 mmol/L   Chloride 101 98 - 111 mmol/L   CO2 32 22 - 32 mmol/L   Glucose, Bld 109 (H) 70 - 99 mg/dL    Comment: Glucose reference range applies only to samples taken after fasting for at least 8 hours.   BUN 12 8 - 23 mg/dL   Creatinine, Ser 0.57 0.44 - 1.00 mg/dL   Calcium 8.1 (L) 8.9 - 10.3 mg/dL   GFR, Estimated >60 >60 mL/min    Comment: (NOTE) Calculated using the CKD-EPI Creatinine Equation (2021)    Anion gap 6 5 - 15    Comment: Performed at [facility] [address], [place], [place] [address]    DG Shoulder Left  Result Date: 11/03/2022 CLINICAL DATA:  Recent fall with left shoulder pain, initial encounter EXAM: LEFT SHOULDER - 2 VIEW COMPARISON:  None Available. FINDINGS: Mild degenerative changes of the acromioclavicular joint are seen. No acute fracture or dislocation is noted. No soft tissue  abnormality is seen. IMPRESSION: No acute abnormality noted. Electronically Signed   By: Inez Catalina M.D.   On: 11/03/2022 00:15   DG Hips Bilat W or Wo Pelvis 5 Views  Result Date: 11/03/2022 CLINICAL DATA:  Left hip pain following fall, initial encounter EXAM: DG HIP (WITH OR WITHOUT PELVIS) 5+V BILAT COMPARISON:  None Available. FINDINGS: Pelvic ring is intact. Mildly displaced left intratrochanteric fracture is seen. The proximal right femur appears within normal limits. No soft tissue abnormality is noted. IMPRESSION: Left intratrochanteric fracture Electronically Signed   By: Inez Catalina M.D.   On: 11/03/2022 00:15   CT Head Wo Contrast  Result Date: 11/02/2022 CLINICAL DATA:  Head trauma, minor (Age >= 65y).  Fall EXAM: CT HEAD WITHOUT CONTRAST TECHNIQUE: Contiguous axial images were obtained from the base  of the skull through the vertex without intravenous contrast. RADIATION DOSE REDUCTION: This exam was performed according to the departmental dose-optimization program which includes automated exposure control, adjustment of the mA and/or kV according to patient size and/or use of iterative reconstruction technique. COMPARISON:  None Available. FINDINGS: Brain: There is atrophy and chronic small vessel disease changes. No acute intracranial abnormality. Specifically, no hemorrhage, hydrocephalus, mass lesion, acute infarction, or significant intracranial injury. Vascular: No hyperdense vessel or unexpected calcification. Skull: No acute calvarial abnormality. Sinuses/Orbits: No acute findings Other: None IMPRESSION: Atrophy, chronic microvascular disease. No acute intracranial abnormality. Electronically Signed   By: Rolm Baptise M.D.   On: 11/02/2022 23:59    Review of Systems  HENT:  Negative for ear discharge, ear pain, hearing loss and tinnitus.   Eyes:  Negative for photophobia and pain.  Respiratory:  Negative for cough and shortness of breath.   Cardiovascular:  Negative for chest pain.   Gastrointestinal:  Negative for abdominal pain, nausea and vomiting.  Genitourinary:  Negative for dysuria, flank pain, frequency and urgency.  Musculoskeletal:  Positive for arthralgias (Left hip). Negative for back pain, myalgias and neck pain.  Neurological:  Negative for dizziness and headaches.  Hematological:  Does not bruise/bleed easily.  Psychiatric/Behavioral:  The patient is not nervous/anxious.    Blood pressure (!) 153/95, pulse 88, temperature 98 F (36.7 C), temperature source Oral, resp. rate (!) 23, height '5\' 2"'$  (1.575 m), weight 54.4 kg, SpO2 98 %. Physical Exam Constitutional:      General: She is not in acute distress.    Appearance: She is well-developed. She is not diaphoretic.  HENT:     Head: Normocephalic and atraumatic.  Eyes:     General: No scleral icterus.       Right eye: No discharge.        Left eye: No discharge.     Conjunctiva/sclera: Conjunctivae normal.  Cardiovascular:     Rate and Rhythm: Normal rate and regular rhythm.  Pulmonary:     Effort: Pulmonary effort is normal. No respiratory distress.  Musculoskeletal:     Cervical back: Normal range of motion.     Comments: LLE No traumatic wounds, ecchymosis, or rash  Mild TTP hip  No knee or ankle effusion  Knee stable to varus/ valgus and anterior/posterior stress  Sens DPN, SPN, TN intact  Motor EHL, ext, flex, evers 5/5  DP 2+, PT 2+, No significant edema  Skin:    General: Skin is warm and dry.  Neurological:     Mental Status: She is alert.  Psychiatric:        Mood and Affect: Mood normal.        Behavior: Behavior normal.     Assessment/Plan: Left hip fx -- Plan IMN today with Dr. Lucia Gaskins. Please keep NPO. Multiple medical problems including hypertension, dyslipidemia, hypothyroidism, seizure disorder, CVA, tobacco use, COPD, anxiety, depression, paroxysmal atrial fibrillation on Eliquis and s/p TAVR -- per primary service    Tammie Abu, PA-C Orthopedic  Surgery [phone] 11/03/2022, 8:58 AM

## 2022-11-03 NOTE — Progress Notes (Signed)
Patient arrived via [facility] ~0250 this morning in stable condition, with belongings at bedside. Fully alert & oriented with intermittent forgetfulness secondary to previous CVA (per patient). Vital signs stable with rate controlled afib at baseline. Left hip bruise, and redness of left outer malleolus, pain controlled with prn regimen. Patient has notified daughter, and will call again if needed. Oriented to room and call bell use. Bed low and locked, call bell and bedside table within rich. NPO since arrival

## 2022-11-03 NOTE — H&P (Signed)
[place]   PATIENT NAME: Tammie Martin    MR#:  [id number]  DATE OF BIRTH:  Feb 18, 1951  DATE OF ADMISSION:  11/02/2022  PRIMARY CARE PHYSICIAN: Janora Norlander, DO   Patient is coming from: Home  REQUESTING/REFERRING PHYSICIAN: Mesner, Corene Cornea, MD    CHIEF COMPLAINT:   Chief Complaint  Patient presents with   Fall    HISTORY OF PRESENT ILLNESS:  HERMAN HEMRIC is a 72 y.o. Caucasian female with medical history significant for hypertension, dyslipidemia, hypothyroidism, seizure disorder, CVA, tobacco use, COPD, anxiety, depression, paroxysmal atrial fibrillation on Eliquis and s/p TAVR, who presented to the emergency room for 30 minutes after a chemical fall.  The patient stated that she was going from her bedroom to the kitchen and lost balance falling on her left side.  She denied any presyncope or syncope.  No headache or dizziness or blurred vision.  No paresthesias or focal muscle weakness.  She denies any chest pain or palpitations.  No cough or wheezing or hemoptysis.  No fever or chills.  No dysuria, oliguria or hematuria or flank pain.  She has hemorrhoids for which she was apparently saw her PCP yesterday.  She was prescribed Anusol HC.  ED Course: When she came to the ER, BP was 149/79 and pulse 70 was 90 to 94% on 1 L of O2 nasal cannula with otherwise normal vital signs.  Respiratory rate later was 29.  Labs revealed hypokalemia of 3 and a CO2 of 33 with a calcium of 8.6, given of 3 with otherwise unremarkable CMP.  CBC showed hemoglobin 11.7 hematocrit 36.2 compared to 13.8 and 40.1 yesterday.  TSH was 5.37 and free T41.7 Yesterday. EKG as reviewed by me : EKG with atrial fibrillation with ventricular response of 87 with borderline right axis deviation and probable LVH and T wave inversion inferiorly. Imaging: Noncontrasted head CT scan revealed atrophy and chronic cardiovascular disease with no acute intracranial abnormality.  Left shoulder x-ray showed no  acute abnormality.  Bilateral hip x-ray showed left intertrochanteric fracture.  The patient was given 1 L bolus of IV normal saline, 10 M EQ IV potassium chloride and 40 M EQ of oral potassium chloride.  Contact was made with Dr. Doreatha Martin recommended transfer to [facility].  She will be admitted to the [facility] surgical telemetry bed for further evaluation and management. 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 CBurt Knackand Dr. BCyndia Bent  Seizures ([facility]    Severe aortic stenosis    Stroke ([facility]    [place]    Thyroid disease    Tobacco abuse     PAST SURGICAL HISTORY:   Past Surgical History:  Procedure Laterality Date   AORTIC VALVE REPLACEMENT     APPENDECTOMY     pt recently had CT that said Appendix was there so she is  unsure if this was actually removed during hysterectomy as she was previously told   BREAST ENHANCEMENT SURGERY     BREAST SURGERY     CARDIAC VALVE REPLACEMENT     CARDIOVERSION N/A 10/27/2021   Procedure: CARDIOVERSION;  Surgeon: Arnoldo Lenis, MD;  Location: [facility];  Service: Endoscopy;  Laterality: N/A;   INTRAOPERATIVE TRANSESOPHAGEAL ECHOCARDIOGRAM N/A 02/01/2022   Procedure: INTRAOPERATIVE TRANSESOPHAGEAL ECHOCARDIOGRAM;  Surgeon: Sherren Mocha, MD;  Location: [facility];  Service: Open Heart Surgery;  Laterality: N/A;   RIGHT/LEFT HEART CATH AND CORONARY ANGIOGRAPHY N/A 11/22/2021   Procedure: RIGHT/LEFT HEART CATH AND CORONARY ANGIOGRAPHY;  Surgeon: Sherren Mocha, MD;  Location: [facility];  Service: Cardiovascular;  Laterality: N/A;   TONSILLECTOMY     VAGINAL HYSTERECTOMY      SOCIAL HISTORY:   Social History   Tobacco Use   Smoking status: Every Day    Packs/day: 1.00    Years: 40.00    Total pack years: 40.00    Types: Cigarettes   Smokeless tobacco: Never   Tobacco comments:    Cant afford medication to help with cessation  Substance Use Topics   Alcohol use: Not Currently    FAMILY HISTORY:   Family History  Problem Relation Age of Onset   Cancer Mother        breast cancer at 63    Kidney disease Mother    Early death Mother    Cancer Maternal Aunt        colon cancer   Heart attack Father    Early death Father    Heart disease Father    Diabetes Sister    Obesity Sister    Depression Sister    Arthritis Sister    Alcohol abuse Brother    Cirrhosis Brother    Depression Brother    Liver disease Brother    Drug abuse Brother    Early death Brother    Diabetes Daughter    Fibromyalgia Daughter    Depression Daughter    Anxiety disorder Daughter    Arthritis Daughter    Depression Sister    Obesity Sister    Diabetes Sister    Early death Sister     DRUG ALLERGIES:   Allergies  Allergen Reactions   Calcium Channel Blockers    Penicillins Hives   Singulair [Montelukast Sodium]     headache   Chantix [Varenicline] Nausea Only    GI intolerance with higher doses   Codeine Rash   Nicoderm [Nicotine] Hives and Swelling   Other Rash    Band-aids cause rash after a few days    REVIEW OF SYSTEMS:   ROS As per history of present illness. All pertinent systems were reviewed above. Constitutional, HEENT, cardiovascular, respiratory, GI, GU, musculoskeletal, neuro, psychiatric, endocrine, integumentary and hematologic systems were reviewed and are otherwise negative/unremarkable except for positive findings mentioned above in the HPI.   MEDICATIONS AT HOME:   Prior to Admission  medications   Medication Sig Start Date End Date Taking? Authorizing Provider  albuterol (VENTOLIN HFA) 108 (90 Base) MCG/ACT inhaler Inhale 2 puffs into the lungs every 6 (six) hours as needed for wheezing or shortness of breath. 11/01/21   Janora Norlander, DO  atorvastatin (LIPITOR) 40 MG tablet TAKE ONE TABLET BY MOUTH DAILY 06/28/22   Ronnie Doss M, DO  azithromycin (ZITHROMAX) 500 MG tablet Take 1 tablet (500 mg total) by mouth as directed. TAKE 500 MG  1 HOUR  PRIOR TO ANY DENTAL  CLEANINGS AND PROCEDURES. 02/16/22   Kathyrn Drown D, NP  bisoprolol (ZEBETA) 10 MG tablet Take 1 and 1/2 tablets (15 mg total) by mouth daily. 09/02/22   Arnoldo Lenis, MD  CALCIUM-MAGNESIUM PO Take 1 tablet by mouth daily.    [provider]  cetirizine (ZYRTEC) 10 MG tablet Take 10 mg by mouth daily.    [provider]  Cholecalciferol (VITAMIN D) 50 MCG (2000 UT) tablet Take 2,000 Units by mouth daily.    [provider]  desvenlafaxine (PRISTIQ) 50 MG 24 hr tablet Take 1 tablet (50 mg total) by mouth daily. (NEEDS TO BE SEEN BEFORE NEXT REFILL) 09/26/22   Ronnie Doss M, DO  ELIQUIS 5 MG TABS tablet TAKE ONE TABLET BY MOUTH TWICE DAILY 06/10/22   Ronnie Doss M, DO  fluticasone (FLONASE) 50 MCG/ACT nasal spray Place 2 sprays into both nostrils daily as needed for allergies or rhinitis. 10/28/20   Janora Norlander, DO  furosemide (LASIX) 20 MG tablet TAKE ONE TABLET BY MOUTH DAILY 08/31/22   Ronnie Doss M, DO  hydrocortisone (ANUSOL-HC) 25 MG suppository Place 1 suppository (25 mg total) rectally 2 (two) times daily as needed for hemorrhoids or anal itching. 11/01/22   Ronnie Doss M, DO  hydrocortisone cream 1 % Apply 1 application. topically 2 (two) times daily as needed (rash).    [provider]  lamoTRIgine (LAMICTAL) 25 MG tablet Start with Lamotrigine '25mg'$  (one pill) at night for one week, then '25mg'$  (one pill) twice daily for one week, then '25mg'$   (one pill) morning and '50mg'$  (two pills) night for one week, then '50mg'$  (two pills) twice daily for one week then '75mg'$  (three pills) twice daily for one week then finally '100mg'$  (fours pills) twice daily 09/12/22   Alric Ran, MD  levETIRAcetam (KEPPRA) 750 MG tablet Take 1 tablet (750 mg total) by mouth 2 (two) times daily. 03/10/22 03/05/23  Alric Ran, MD  levothyroxine (SYNTHROID) 88 MCG tablet TAKE ONE TABLET BY MOUTH DAILY 09/08/22   Ronnie Doss M, DO  Multiple Vitamins-Minerals (ALIVE WOMENS 50+ PO) Take 1 tablet by mouth daily.    [provider]  Multiple Vitamins-Minerals (PRESERVISION AREDS PO) Take 1 capsule by mouth 2 (two) times daily.    [provider]  Omega-3-6-9 CAPS Take 1 capsule by mouth 2 (two) times daily.    [provider]  Polyethyl Glycol-Propyl Glycol (SYSTANE OP) Place 1 drop into both eyes daily as needed (dry eyes).    [provider]  polyethylene glycol powder (GLYCOLAX/MIRALAX) 17 GM/SCOOP powder Mix 1 capful in 8 ounces of water and drink up to twice daily as needed for constipation 11/01/22   Ronnie Doss M, DO  potassium chloride SA (KLOR-CON M) 10 MEQ tablet Take 1 tablet (10 mEq total) by mouth daily. 01/28/22   Eileen Stanford, PA-C  Probiotic Product (PROBIOTIC ADVANCED PO) Take 1 capsule by mouth daily.    [provider]  traZODone (DESYREL) 50 MG tablet Take 1 tablet (50 mg total) by mouth at bedtime as needed for sleep. 03/18/22   Ronnie Doss M, DO  TURMERIC PO Take 1 capsule by mouth 2 (two) times daily.    [provider]      VITAL SIGNS:  Blood pressure (!) 149/79, pulse 69, temperature 97.9 F (36.6 C), resp. rate 19, SpO2 90 %.  PHYSICAL EXAMINATION:  Physical Exam  GENERAL:  72 y.o.-year-old Caucasian female patient lying in  the bed with mild distress from pain. EYES: Pupils equal, round, reactive to light and accommodation. No scleral icterus. Extraocular muscles intact.   HEENT: Head atraumatic, normocephalic. Oropharynx and nasopharynx clear.  NECK:  Supple, no jugular venous distention. No thyroid enlargement, no tenderness.  LUNGS: Normal breath sounds bilaterally, no wheezing, rales,rhonchi or crepitation. No use of accessory muscles of respiration.  CARDIOVASCULAR: Regular rate and rhythm, S1, S2 normal. No murmurs, rubs, or gallops.  ABDOMEN: Soft, nondistended, nontender. Bowel sounds present. No organomegaly or mass.  EXTREMITIES: No pedal edema, cyanosis, or clubbing. Musculoskeletal: Left lateral leg tenderness with lateral rotation. NEUROLOGIC: Cranial nerves II through XII are intact. Muscle strength 5/5 in all extremities. Sensation intact. Gait not checked.  PSYCHIATRIC: The patient is alert and oriented x 3.  Normal affect and good eye contact. SKIN: No obvious rash, lesion, or ulcer.   LABORATORY PANEL:   CBC Recent Labs  Lab 11/02/22 2338  WBC 9.1  HGB 11.7*  HCT 36.2  PLT 218   ------------------------------------------------------------------------------------------------------------------  Chemistries  Recent Labs  Lab 11/02/22 2338  NA 139  K 3.0*  CL 98  CO2 33*  GLUCOSE 99  BUN 22  CREATININE 0.71  CALCIUM 8.6*  MG 2.0  AST 24  ALT 18  ALKPHOS 80  BILITOT 0.3   ------------------------------------------------------------------------------------------------------------------  Cardiac Enzymes No results for input(s): "TROPONINI" in the last 168 hours. ------------------------------------------------------------------------------------------------------------------  RADIOLOGY:  DG Shoulder Left  Result Date: 11/03/2022 CLINICAL DATA:  Recent fall with left shoulder pain, initial encounter EXAM: LEFT SHOULDER - 2 VIEW COMPARISON:  None Available. FINDINGS: Mild degenerative changes of the acromioclavicular joint are seen. No acute fracture or dislocation is noted. No soft tissue abnormality is seen. IMPRESSION: No  acute abnormality noted. Electronically Signed   By: Inez Catalina M.D.   On: 11/03/2022 00:15   DG Hips Bilat W or Wo Pelvis 5 Views  Result Date: 11/03/2022 CLINICAL DATA:  Left hip pain following fall, initial encounter EXAM: DG HIP (WITH OR WITHOUT PELVIS) 5+V BILAT COMPARISON:  None Available. FINDINGS: Pelvic ring is intact. Mildly displaced left intratrochanteric fracture is seen. The proximal right femur appears within normal limits. No soft tissue abnormality is noted. IMPRESSION: Left intratrochanteric fracture Electronically Signed   By: Inez Catalina M.D.   On: 11/03/2022 00:15   CT Head Wo Contrast  Result Date: 11/02/2022 CLINICAL DATA:  Head trauma, minor (Age >= 65y).  Fall EXAM: CT HEAD WITHOUT CONTRAST TECHNIQUE: Contiguous axial images were obtained from the base of the skull through the vertex without intravenous contrast. RADIATION DOSE REDUCTION: This exam was performed according to the departmental dose-optimization program which includes automated exposure control, adjustment of the mA and/or kV according to patient size and/or use of iterative reconstruction technique. COMPARISON:  None Available. FINDINGS: Brain: There is atrophy and chronic small vessel disease changes. No acute intracranial abnormality. Specifically, no hemorrhage, hydrocephalus, mass lesion, acute infarction, or significant intracranial injury. Vascular: No hyperdense vessel or unexpected calcification. Skull: No acute calvarial abnormality. Sinuses/Orbits: No acute findings Other: None IMPRESSION: Atrophy, chronic microvascular disease. No acute intracranial abnormality. Electronically Signed   By: Rolm Baptise M.D.   On: 11/02/2022 23:59      IMPRESSION AND PLAN:  Assessment and Plan: * Closed displaced intertrochanteric fracture of left femur ([place]) - The Patient will be [facility] to [facility] to surgical telemetry bed. - Pain management will be provided. - Orthopedic consultation will be obtained. - Dr.  Doreatha Martin was notified and is  aware about the patient. - We will hold off Eliquis. -The patient has a history of CVA with no history of CHF, CAD, diabetes mellitus on insulin, or renal failure with a creatinine more than 2.  She is considered above average risk for age for perioperative cardiovascular events per the revised cardiac risk index.  She does have a history of COPD without chronic active/acute pulmonary issues.  Hypokalemia - Potassium will be replaced and followed.  Magnesium level came back normal at 2.  A-fib ([place] [place]) - We will continue her Zebeta and hold off Eliquis for expected operative intervention.  Ventricular response is currently controlled. - We will optimize her electrolytes.  Seizure disorder ([place]) - We will continue her Keppra and Lamictal.  Hypothyroidism - We will continue Synthroid.  Dyslipidemia - We will continue statin therapy which should offer cardiovascular risk reduction as well as Lovaza.  Anxiety and depression - We will continue her antidepressant.  Chronic obstructive pulmonary disease (COPD) (HCC) - We will continue as needed albuterol.  Tobacco abuse - Will be counseled for smoking cessation.  Essential hypertension, benign - We will continue her Zebeta that should offer perioperative cardiovascular risk reduction.    DVT prophylaxis: SCDs. Advanced Care Planning:  Code Status: The patient is DNR.  She wants to be intubated in the setting of respiratory arrest without cardiac arrest. Family Communication:  The plan of care was discussed in details with the patient (and family). I answered all questions. The patient agreed to proceed with the above mentioned plan. Further management will depend upon hospital course. Disposition Plan: Back to previous home environment Consults called: Orthopedic consult. All the records are reviewed and case discussed with ED provider.  Status is: Inpatient   At the time of the admission, it appears that  the appropriate admission status for this patient is inpatient.  This is judged to be reasonable and necessary in order to provide the required intensity of service to ensure the patient's safety given the presenting symptoms, physical exam findings and initial radiographic and laboratory data in the context of comorbid conditions.  The patient requires inpatient status due to high intensity of service, high risk of further deterioration and high frequency of surveillance required.  I certify that at the time of admission, it is my clinical judgment that the patient will require inpatient hospital care extending more than 2 midnights.                            Dispo: The patient is from: Home              Anticipated d/c is to: Home              Patient currently is not medically stable to d/c.              Difficult to place patient: No  Christel Mormon M.D on 11/03/2022 at 2:23 AM  [facility]   From 7 PM-7 AM, contact night-coverage [email or web address]  CC: Primary care physician; Janora Norlander, DO

## 2022-11-03 NOTE — Assessment & Plan Note (Signed)
-   We will continue as needed albuterol. ?

## 2022-11-03 NOTE — Op Note (Signed)
Tammie Martin female 72 y.o. 11/03/2022  PreOperative Diagnosis: Left intertochanteric hip fracture  PostOperative Diagnosis: same  PROCEDURE: Cephalomedullary nail of Left hip fracture  SURGEON: Melony Overly, MD  ASSISTANT: J Martinique  ANESTHESIA: General endotracheal tube anesthesia  FINDINGS: Displaced intertrochanteric hip fracture  IMPLANTS: Arthrex short intertroch nail 9 mm  INDICATIONS:71 y.o. femalehad a fall and sustained a intertrochanteric hip fracture.  She was indicated for surgery due to baseline ambulatory status and significant pain due to the fracture.  Discussion was had with the patient about the surgery and they wish to proceed.  Patient understood the risks, benefits and alternatives to surgery which include but are not limited to wound healing complications, infection, nonunion, malunion, need for further surgery as well as damage to surrounding structures. They also understood the potential for continued pain in that there were no guarantees of acceptable outcome After weighing these risks the patient opted to proceed with surgery.  PROCEDURE: Patient was identified in the preoperative holding area and the left hip was marked by myself.  The consent was signed by myself and the patient.  This identified the left lower extremity as the appropriate operative extremity and the surgeries to be performed was operative treatment of the hip fracture. They  were taken to the operating room where general endotracheal anesthesia was induced without difficulty and then was moved supine on a Hana table.  The operative leg was placed into the traction boot.  The non-operative leg was well-padded and affixed to the right leg holder with coban.  The arm was crossed over the chest and well padded. 2 g of Ancef was given.  Surgical timeout was performed.    Using fluoroscopy appropriate AP and lateral x-rays of the hip were obtained.  Then using traction through the Hana  table was able to reduce the fracture fragments in a closed fashion.  Acceptable reduction was confirmed on AP and lateral x-rays.  Then the left hip was prepped and draped in usual sterile fashion.  A shower curtain drape was placed.  We began the surgery by placing the guidepin percutaneously at the appropriate starting point at the tip of the greater trochanter.  The appropriate starting point was confirmed on AP and lateral radiograph.  Then a 3 cm incision was made about the site of the percutaneous placement of the pin and the opening reamer with a soft tissue guide was placed into the wound down to the tip of the greater trochanter and the bone was entered with the opening reamer down to the level of lesser trochanter.  Then the openning reamer and guide pin were removed and a nail was placed. Then we chose an 9 mm cephalo-medullary nail.  This was placed without difficulty and appropriate positioning was confirmed on fluoroscopy.  Then using the blade insertion guide a second incision was made distally down the thigh to allow for placement of the cephalo-medullary screw.  The guidepin for the blade was placed up the femoral neck in the appropriate position center center in the femoral head.  Then the reamer was used to ream for the blade and the blade was placed without difficulty.  The length of the blade was a 90 mm.  Then the set screw was tightened.  Then the distal interlock screw was placed without difficulty.  Final films were taken.  The wounds were irrigated with normal saline and the incisions were closed in a layered fashion using 2-0 monocryl and staples.  Mepilex dressings were  used.  Patient was then transferred to the hospital bed and awakened from anesthesia without difficulty.  Counts were correct at the end the case.  There were no complications.  POST OPERATIVE INSTRUCTIONS: WBAT operative extremity Patient will be discharged on 325 mg aspirin for DVT prophylaxis if not already on  DVT prophylaxis at baseline Follow-up in 2 weeks for x-rays of the operative hip and suture removal if appropriate    BLOOD LOSS:  200 mL         DRAINS: none         SPECIMEN: none       COMPLICATIONS:  * No complications entered in OR log *         Disposition: PACU - hemodynamically stable.         Condition: stable

## 2022-11-03 NOTE — Assessment & Plan Note (Signed)
-   Potassium will be replaced and followed.  Magnesium level came back normal at 2.

## 2022-11-03 NOTE — Assessment & Plan Note (Signed)
-   Will be counseled for smoking cessation.

## 2022-11-03 NOTE — Assessment & Plan Note (Signed)
-   We will continue her antidepressant.

## 2022-11-03 NOTE — Progress Notes (Signed)
PT. Arrived to unit alert X 4, no c/o pain, B/P 84/60 Family at bedside, call bell within reach, bed in lowest position. Md notified see new order [facility]

## 2022-11-03 NOTE — Assessment & Plan Note (Signed)
-   We will continue her Zebeta that should offer perioperative cardiovascular risk reduction.

## 2022-11-03 NOTE — Assessment & Plan Note (Signed)
-   We will continue her Keppra and Lamictal.

## 2022-11-03 NOTE — Progress Notes (Signed)
PHARMACY NOTE:  ANTIMICROBIAL RENAL DOSAGE ADJUSTMENT  Current antimicrobial regimen includes a mismatch between antimicrobial dosage and estimated renal function.  As per policy approved by the Pharmacy & Therapeutics and Medical Executive Committees, the antimicrobial dosage will be adjusted accordingly.  Current antimicrobial dosage:  cefazolin 1g q6 hr  Indication: surgical prophylaxis   Renal Function:  Estimated Creatinine Clearance: 49.5 mL/min (by C-G formula based on SCr of 0.57 mg/dL).     Antimicrobial dosage has been changed to:  1g q8 hr     Thank you for allowing pharmacy to be a part of this patient's care.  Benetta Spar, PharmD, BCPS, BCCP Clinical Pharmacist  Please check AMION for all [place] phone numbers After 10:00 PM, call [place] [phone]

## 2022-11-03 NOTE — ED Provider Notes (Addendum)
Emergency Department Provider Note  I have reviewed the triage vital signs and the nursing notes.  HISTORY  Chief Complaint Fall   HPI Tammie Martin is a 72 y.o. female with history of A-fib on Eliquis last dose Wednesday morning, emphysema, hypertension, stroke, aortic stenosis status post replacement who presents the ER today secondary to a fall and left hip pain left shoulder pain.  She states that she was standing up to go to the bathroom and just lost her balance.  Did not hit her head did not syncopized but did hit her head a few days ago.  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 CBurt Knackand Dr. BCyndia Bent  Seizures ([facility]    Severe aortic stenosis    Stroke ([facility]    [place]    Thyroid disease    Tobacco abuse     Home Medications Prior to Admission medications   Medication Sig Start Date End Date Taking? Authorizing Provider  albuterol (VENTOLIN HFA) 108 (90 Base) MCG/ACT inhaler Inhale 2 puffs into the lungs every 6 (six) hours as needed for wheezing or shortness of breath. 11/01/21   GJanora Norlander DO  atorvastatin (LIPITOR) 40 MG tablet TAKE ONE TABLET BY MOUTH DAILY 06/28/22   GRonnie DossM, DO  azithromycin (ZITHROMAX) 500 MG tablet Take 1 tablet (500 mg total) by mouth as directed. TAKE 500 MG  1 HOUR PRIOR TO ANY DENTAL  CLEANINGS AND PROCEDURES. 02/16/22   MKathyrn DrownD, NP  bisoprolol (ZEBETA) 10 MG tablet Take 1 and 1/2 tablets (15 mg total) by mouth daily.  09/02/22   BArnoldo Lenis MD  CALCIUM-MAGNESIUM PO Take 1 tablet by mouth daily.    [provider]  cetirizine (ZYRTEC) 10 MG tablet Take 10 mg by mouth daily.    [provider]  Cholecalciferol (VITAMIN D) 50 MCG (2000 UT) tablet Take 2,000 Units by mouth daily.    [provider]  desvenlafaxine (PRISTIQ) 50 MG 24 hr tablet Take 1 tablet (50 mg total) by mouth daily. (NEEDS TO BE SEEN BEFORE NEXT REFILL) 09/26/22   GRonnie DossM, DO  ELIQUIS 5 MG TABS tablet TAKE ONE TABLET BY MOUTH TWICE DAILY 06/10/22   GRonnie DossM, DO  fluticasone (FLONASE) 50 MCG/ACT nasal spray Place 2 sprays into both nostrils daily as needed for allergies or rhinitis. 10/28/20   GJanora Norlander DO  furosemide (LASIX) 20 MG tablet TAKE ONE TABLET BY MOUTH DAILY 08/31/22   GRonnie DossM, DO  hydrocortisone (ANUSOL-HC) 25 MG suppository Place 1 suppository (25 mg total) rectally 2 (two) times daily as needed for hemorrhoids or anal itching. 11/01/22   GRonnie DossM, DO  hydrocortisone cream 1 % Apply 1 application. topically 2 (two) times daily as needed (rash).    [provider]  lamoTRIgine (LAMICTAL) 25 MG tablet Start with Lamotrigine '25mg'$  (  one pill) at night for one week, then '25mg'$  (one pill) twice daily for one week, then '25mg'$  (one pill) morning and '50mg'$  (two pills) night for one week, then '50mg'$  (two pills) twice daily for one week then '75mg'$  (three pills) twice daily for one week then finally '100mg'$  (fours pills) twice daily 09/12/22   Alric Ran, MD  levETIRAcetam (KEPPRA) 750 MG tablet Take 1 tablet (750 mg total) by mouth 2 (two) times daily. 03/10/22 03/05/23  Alric Ran, MD  levothyroxine (SYNTHROID) 88 MCG tablet TAKE ONE TABLET BY MOUTH DAILY 09/08/22   Ronnie Doss M, DO  Multiple Vitamins-Minerals (ALIVE WOMENS 50+ PO) Take 1 tablet by mouth daily.    [provider]  Multiple Vitamins-Minerals (PRESERVISION AREDS PO) Take 1 capsule  by mouth 2 (two) times daily.    [provider]  Omega-3-6-9 CAPS Take 1 capsule by mouth 2 (two) times daily.    [provider]  Polyethyl Glycol-Propyl Glycol (SYSTANE OP) Place 1 drop into both eyes daily as needed (dry eyes).    [provider]  polyethylene glycol powder (GLYCOLAX/MIRALAX) 17 GM/SCOOP powder Mix 1 capful in 8 ounces of water and drink up to twice daily as needed for constipation 11/01/22   Ronnie Doss M, DO  potassium chloride SA (KLOR-CON M) 10 MEQ tablet Take 1 tablet (10 mEq total) by mouth daily. 01/28/22   Eileen Stanford, PA-C  Probiotic Product (PROBIOTIC ADVANCED PO) Take 1 capsule by mouth daily.    [provider]  traZODone (DESYREL) 50 MG tablet Take 1 tablet (50 mg total) by mouth at bedtime as needed for sleep. 03/18/22   Ronnie Doss M, DO  TURMERIC PO Take 1 capsule by mouth 2 (two) times daily.    [provider]    Social History Social History   Tobacco Use   Smoking status: Every Day    Packs/day: 1.00    Years: 40.00    Total pack years: 40.00    Types: Cigarettes   Smokeless tobacco: Never   Tobacco comments:    Cant afford medication to help with cessation  Vaping Use   Vaping Use: Never used  Substance Use Topics   Alcohol use: Not Currently   Drug use: No    Review of Systems: Documented in HPI ____________________________________________  PHYSICAL EXAM: VITAL SIGNS: ED Triage Vitals [11/02/22 2244]  Enc Vitals Group     BP (!) 149/79     Pulse Rate 69     Resp 19     Temp 97.9 F (36.6 C)     Temp src      SpO2 90 %     Weight      Height      Head Circumference      Peak Flow      Pain Score 5     Pain Loc      Pain Edu?      Excl. in [place]?    Physical Exam Vitals and nursing note reviewed.  Constitutional:      Appearance: She is well-developed.  HENT:     Head: Normocephalic and atraumatic.     Nose: Nose normal.     Mouth/Throat:     Mouth: Mucous  membranes are moist.     Pharynx: Oropharynx is clear.  Cardiovascular:     Rate and Rhythm: Normal rate and regular rhythm.  Pulmonary:     Effort: No respiratory distress.     Breath sounds:  No stridor.  Abdominal:     General: There is no distension.  Musculoskeletal:        General: Tenderness (Over left lateral hip worse with movement.  Neurovascularly intact distally.) present.     Cervical back: Normal range of motion.  Neurological:     Mental Status: She is alert.       ____________________________________________   LABS (all labs ordered are listed, but only abnormal results are displayed)  Labs Reviewed  CBC WITH DIFFERENTIAL/PLATELET - Abnormal; Notable for the following components:      Result Value   RBC 3.59 (*)    Hemoglobin 11.7 (*)    MCV 100.8 (*)    All other components within normal limits  COMPREHENSIVE METABOLIC PANEL - Abnormal; Notable for the following components:   Potassium 3.0 (*)    CO2 33 (*)    Calcium 8.6 (*)    Albumin 3.0 (*)    All other components within normal limits  URINALYSIS, ROUTINE W REFLEX MICROSCOPIC  MAGNESIUM   ____________________________________________  EKG   EKG Interpretation  Date/Time:  Thursday November 03 2022 00:42:51 EST Ventricular Rate:  87 PR Interval:    QRS Duration: 96 QT Interval:  344 QTC Calculation: 392 R Axis:   87 Text Interpretation: Atrial fibrillation Borderline right axis deviation Probable LVH with secondary repol abnrm Confirmed by Merrily Pew [phone]) on 11/03/2022 3:05:17 AM        ____________________________________________  RADIOLOGY  DG Shoulder Left  Result Date: 11/03/2022 CLINICAL DATA:  Recent fall with left shoulder pain, initial encounter EXAM: LEFT SHOULDER - 2 VIEW COMPARISON:  None Available. FINDINGS: Mild degenerative changes of the acromioclavicular joint are seen. No acute fracture or dislocation is noted. No soft tissue abnormality is seen. IMPRESSION: No acute  abnormality noted. Electronically Signed   By: Inez Catalina M.D.   On: 11/03/2022 00:15   DG Hips Bilat W or Wo Pelvis 5 Views  Result Date: 11/03/2022 CLINICAL DATA:  Left hip pain following fall, initial encounter EXAM: DG HIP (WITH OR WITHOUT PELVIS) 5+V BILAT COMPARISON:  None Available. FINDINGS: Pelvic ring is intact. Mildly displaced left intratrochanteric fracture is seen. The proximal right femur appears within normal limits. No soft tissue abnormality is noted. IMPRESSION: Left intratrochanteric fracture Electronically Signed   By: Inez Catalina M.D.   On: 11/03/2022 00:15   CT Head Wo Contrast  Result Date: 11/02/2022 CLINICAL DATA:  Head trauma, minor (Age >= 65y).  Fall EXAM: CT HEAD WITHOUT CONTRAST TECHNIQUE: Contiguous axial images were obtained from the base of the skull through the vertex without intravenous contrast. RADIATION DOSE REDUCTION: This exam was performed according to the departmental dose-optimization program which includes automated exposure control, adjustment of the mA and/or kV according to patient size and/or use of iterative reconstruction technique. COMPARISON:  None Available. FINDINGS: Brain: There is atrophy and chronic small vessel disease changes. No acute intracranial abnormality. Specifically, no hemorrhage, hydrocephalus, mass lesion, acute infarction, or significant intracranial injury. Vascular: No hyperdense vessel or unexpected calcification. Skull: No acute calvarial abnormality. Sinuses/Orbits: No acute findings Other: None IMPRESSION: Atrophy, chronic microvascular disease. No acute intracranial abnormality. Electronically Signed   By: Rolm Baptise M.D.   On: 11/02/2022 23:59   ____________________________________________  PROCEDURES  Procedure(s) performed:   Procedures ____________________________________________  INITIAL IMPRESSION / ASSESSMENT AND PLAN   This patient presents to the ED for concern of fall with shoulder and hip pain, this  involves an extensive number of treatment options,  and is a complaint that carries with it a high risk of complications and morbidity.  The differential diagnosis includes fracture, contusion,   Additional history obtained:  Additional history obtained from EMS Previous records obtained and reviewed in epic  Co morbidities that complicate the patient evaluation  Afib Frequent falls  Social Determinants of Health:  In an ALF  ED Course  Images ordered viewed and obtained by myself. Agree with Radiology interpretation. Details in ED course.  Labs ordered reviewed by myself as detailed in ED course.  Consultations obtained/considered detailed in ED course.        Cardiac Monitoring:  The patient was maintained on a cardiac monitor.  I personally viewed and interpreted the cardiac monitored which showed an underlying rhythm of: Afib normal rate  CRITICAL INTERVENTIONS:  Orthopedics consult Potassium replacement  Reevaluation:  After the interventions noted above, I reevaluated the patient and found that they have :improved  FINAL IMPRESSION AND PLAN Final diagnoses:  Closed displaced intertrochanteric fracture of left femur, initial encounter ([place])  Hypokalemia   On my interpretation there does appear to be a left hip fracture.  Is on Eliquis.  I discussed with Dr. Doreatha Martin with orthopedics in [place] and he recommends admission to [facility] and we will see him in the morning for surgical planning and the Eliquis washes out.  Discussed the hospitalist for admission.  Medical screening exam was performed and I feel the patient has had appropriate emergency department evaluation and work-up for their chief complaint and is stable for ADMISSION to the hospitalist time.  I discussed with Dr. Sidney Ace with the [facility] service and discussed labs, imaging and other work-up in the emergency room.  They agree to admission for further management and work-up of said condition.    ____________________________________________   NEW OUTPATIENT MEDICATIONS STARTED DURING THIS VISIT:  New Prescriptions   No medications on file    Note:  This note was prepared with assistance of Dragon voice recognition software. Occasional wrong-word or sound-a-like substitutions may have occurred due to the inherent limitations of voice recognition software.    Isaly Fasching, Corene Cornea, MD 11/03/22 Consuello Masse, MD 11/03/22 [phone]

## 2022-11-03 NOTE — Anesthesia Preprocedure Evaluation (Addendum)
Anesthesia Evaluation  Patient identified by MRN, date of birth, ID band Patient awake    Reviewed: Allergy & Precautions, NPO status , Patient's Chart, lab work & pertinent test results  Airway Mallampati: II  TM Distance: >3 FB Neck ROM: Full    Dental  (+) Missing   Pulmonary COPD,  COPD inhaler, Current Smoker and Patient abstained from smoking.   Pulmonary exam normal        Cardiovascular hypertension, Normal cardiovascular exam+ dysrhythmias Atrial Fibrillation + Valvular Problems/Murmurs (s/p TAVR) AS      Neuro/Psych Seizures -,  PSYCHIATRIC DISORDERS Anxiety Depression    CVA    GI/Hepatic negative GI ROS, Neg liver ROS,,,  Endo/Other  Hypothyroidism    Renal/GU negative Renal ROS     Musculoskeletal  (+) Arthritis ,    Abdominal   Peds  Hematology  (+) Blood dyscrasia (Eliquis), anemia   Anesthesia Other Findings left hip fracture  Reproductive/Obstetrics                             Anesthesia Physical Anesthesia Plan  ASA: 3  Anesthesia Plan: General   Post-op Pain Management:    Induction: Intravenous  PONV Risk Score and Plan: 2 and Ondansetron, Dexamethasone and Treatment may vary due to age or medical condition  Airway Management Planned: Oral ETT  Additional Equipment:   Intra-op Plan:   Post-operative Plan: Extubation in OR  Informed Consent: I have reviewed the patients History and Physical, chart, labs and discussed the procedure including the risks, benefits and alternatives for the proposed anesthesia with the patient or authorized representative who has indicated his/her understanding and acceptance.   Patient has DNR.  Discussed DNR with patient and Continue DNR.   Dental advisory given  Plan Discussed with: CRNA  Anesthesia Plan Comments: (Patient requests no defibrillation )        Anesthesia Quick Evaluation

## 2022-11-03 NOTE — Anesthesia Procedure Notes (Signed)
Procedure Name: Intubation Date/Time: 11/03/2022 12:21 PM  Performed by: Reggie Pile, CRNAPre-anesthesia Checklist: Patient identified, Emergency Drugs available, Suction available and Patient being monitored Patient Re-evaluated:Patient Re-evaluated prior to induction Oxygen Delivery Method: Circle system utilized Preoxygenation: Pre-oxygenation with 100% oxygen Induction Type: IV induction Ventilation: Mask ventilation without difficulty Laryngoscope Size: Miller and 2 Grade View: Grade I Tube type: Oral Tube size: 7.0 mm Number of attempts: 1 Airway Equipment and Method: Stylet and Oral airway Placement Confirmation: ETT inserted through vocal cords under direct vision, positive ETCO2 and breath sounds checked- equal and bilateral Secured at: 21 cm Tube secured with: Tape Dental Injury: Teeth and Oropharynx as per pre-operative assessment

## 2022-11-03 NOTE — Progress Notes (Signed)
[facility] PROGRESS NOTE    Progress Note  Tammie Martin  [id number] DOB: Dec 29, 1950 DOA: 11/02/2022 PCP: Janora Norlander, DO     Brief Narrative:   Tammie Martin is an 72 y.o. female past medical history significant for essential hypertension, hypothyroidism seizure disorder history of CVA, paroxysmal atrial fibrillation on Eliquis status post TAVR is comes into the ED after a mechanical fall x-ray confirmed close displaced intertrochanteric femur fracture on the left    Assessment/Plan:   Closed displaced intertrochanteric fracture of left femur [facility]) Orthopedic surgery was consulted Dr. Doreatha Martin and will reevaluate in the morning. Currently n.p.o. hold Eliquis. Continue narcotics for analgesics.  Normal bowel regimen. Moderate risk for cardiopulmonary complications family would like to proceed.  Hypokalemia Replete orally, basic metabolic panels pending this morning.  A-fib (HCC) Rate controlled, continue beta-blockers. Hold Eliquis. Try to optimize electrolytes.  Basic metabolic panels pending this morning.  Seizure disorder (HCC) Continue Keppra and Lamictal.  Hypothyroidism Continue Synthroid.  Dyslipidemia Continue Lovaza.  Essential hypertension, benign Continue Zebeta as it would offer perioperative cardiovascular risk reduction.  Chronic obstructive pulmonary disease (COPD) ([place]) Setting 90% on room air continue inhalers stable.  Anxiety and depression Current regimen.   DVT prophylaxis: lovenox Family Communication:none Status is: Inpatient Remains inpatient appropriate because: Left hip fracture    Code Status:     Code Status Orders  (From admission, onward)           Start     Ordered   11/03/22 0225  Do not attempt resuscitation (DNR)  Continuous       Question Answer Comment  If patient has no pulse and is not breathing Do Not Attempt Resuscitation   If patient has a pulse and/or is breathing: Medical Treatment  Goals MEDICAL INTERVENTIONS DESIRED: Use advanced airway interventions, mechanical ventilation or cardioversion in appropriate circumstances; Use medication/IV fluids as indicated; Provide comfort medications; Transfer to Progressive/Stepdown/ICU as indicated.   Consent: Discussion documented in EHR or advanced directives reviewed      11/03/22 0224           Code Status History     Date Active Date Inactive Code Status Order ID Comments User Context   11/03/2022 0123 11/03/2022 0224 Full Code [id number]  Mansy, Arvella Merles, MD ED   02/07/2022 1010 02/11/2022 1933 Full Code [id number]  Germain Osgood, PA-C Inpatient   02/07/2022 0957 02/07/2022 1010 Full Code [id number]  Rolla Plate, DO Inpatient   02/04/2022 1757 02/06/2022 1808 Full Code [id number]  Albertine Patricia, MD ED   02/01/2022 1432 02/02/2022 2016 Full Code [id number]  Eileen Stanford, PA-C Inpatient   11/22/2021 0933 11/22/2021 1756 Full Code [id number]  Sherren Mocha, MD Inpatient         IV Access:   Peripheral IV   Procedures and diagnostic studies:   DG Shoulder Left  Result Date: 11/03/2022 CLINICAL DATA:  Recent fall with left shoulder pain, initial encounter EXAM: LEFT SHOULDER - 2 VIEW COMPARISON:  None Available. FINDINGS: Mild degenerative changes of the acromioclavicular joint are seen. No acute fracture or dislocation is noted. No soft tissue abnormality is seen. IMPRESSION: No acute abnormality noted. Electronically Signed   By: Inez Catalina M.D.   On: 11/03/2022 00:15   DG Hips Bilat W or Wo Pelvis 5 Views  Result Date: 11/03/2022 CLINICAL DATA:  Left hip pain following fall, initial encounter EXAM: DG HIP (WITH OR WITHOUT PELVIS) 5+V BILAT COMPARISON:  None Available. FINDINGS:  Pelvic ring is intact. Mildly displaced left intratrochanteric fracture is seen. The proximal right femur appears within normal limits. No soft tissue abnormality is noted. IMPRESSION: Left intratrochanteric fracture Electronically Signed    By: Inez Catalina M.D.   On: 11/03/2022 00:15   CT Head Wo Contrast  Result Date: 11/02/2022 CLINICAL DATA:  Head trauma, minor (Age >= 65y).  Fall EXAM: CT HEAD WITHOUT CONTRAST TECHNIQUE: Contiguous axial images were obtained from the base of the skull through the vertex without intravenous contrast. RADIATION DOSE REDUCTION: This exam was performed according to the departmental dose-optimization program which includes automated exposure control, adjustment of the mA and/or kV according to patient size and/or use of iterative reconstruction technique. COMPARISON:  None Available. FINDINGS: Brain: There is atrophy and chronic small vessel disease changes. No acute intracranial abnormality. Specifically, no hemorrhage, hydrocephalus, mass lesion, acute infarction, or significant intracranial injury. Vascular: No hyperdense vessel or unexpected calcification. Skull: No acute calvarial abnormality. Sinuses/Orbits: No acute findings Other: None IMPRESSION: Atrophy, chronic microvascular disease. No acute intracranial abnormality. Electronically Signed   By: Rolm Baptise M.D.   On: 11/02/2022 23:59     Medical Consultants:   None.   Subjective:    Tammie Martin relates her pain is controlled as long as she does not move.  Objective:    Vitals:   11/02/22 2244 11/02/22 2248 11/03/22 0315  BP: (!) 149/79  (!) 144/92  Pulse: 69 69 90  Resp: 19  18  Temp: 97.9 F (36.6 C)  97.7 F (36.5 C)  TempSrc:   Oral  SpO2: 90% 90% 98%  Weight:   54.4 kg  Height:   '5\' 2"'$  (1.575 m)   SpO2: 98 % O2 Flow Rate (L/min): 1 L/min   Intake/Output Summary (Last 24 hours) at 11/03/2022 0728 Last data filed at 11/03/2022 0500 Gross per 24 hour  Intake 150.21 ml  Output --  Net 150.21 ml   Filed Weights   11/03/22 0315  Weight: 54.4 kg    Exam: General exam: In no acute distress. Respiratory system: Good air movement and clear to auscultation. Cardiovascular system: S1 & S2 heard, RRR. No  JVD. Gastrointestinal system: Abdomen is nondistended, soft and nontender.  Extremities: No pedal edema. Skin: No rashes, lesions or ulcers Psychiatry: Judgement and insight appear normal. Mood & affect appropriate.    Data Reviewed:    Labs: Basic Metabolic Panel: Recent Labs  Lab 11/01/22 1523 11/02/22 2338  NA 143 139  K 3.4* 3.0*  CL 95* 98  CO2 31* 33*  GLUCOSE 118* 99  BUN 19 22  CREATININE 0.83 0.71  CALCIUM 9.9 8.6*  MG  --  2.0   GFR Estimated Creatinine Clearance: 49.5 mL/min (by C-G formula based on SCr of 0.71 mg/dL). Liver Function Tests: Recent Labs  Lab 11/02/22 2338  AST 24  ALT 18  ALKPHOS 80  BILITOT 0.3  PROT 6.6  ALBUMIN 3.0*   No results for input(s): "LIPASE", "AMYLASE" in the last 168 hours. No results for input(s): "AMMONIA" in the last 168 hours. Coagulation profile No results for input(s): "INR", "PROTIME" in the last 168 hours. COVID-19 Labs  No results for input(s): "DDIMER", "FERRITIN", "LDH", "CRP" in the last 72 hours.  Lab Results  Component Value Date   SARSCOV2NAA NEGATIVE 02/07/2022   [place] NEGATIVE 01/28/2022    CBC: Recent Labs  Lab 11/01/22 1528 11/02/22 2338  WBC 9.1 9.1  NEUTROABS  --  5.5  HGB 13.8 11.7*  HCT 40.1 36.2  MCV 96 100.8*  PLT 254 218   Cardiac Enzymes: No results for input(s): "CKTOTAL", "CKMB", "CKMBINDEX", "TROPONINI" in the last 168 hours. BNP (last 3 results) No results for input(s): "PROBNP" in the last 8760 hours. CBG: No results for input(s): "GLUCAP" in the last 168 hours. D-Dimer: No results for input(s): "DDIMER" in the last 72 hours. Hgb A1c: No results for input(s): "HGBA1C" in the last 72 hours. Lipid Profile: No results for input(s): "CHOL", "HDL", "LDLCALC", "TRIG", "CHOLHDL", "LDLDIRECT" in the last 72 hours. Thyroid function studies: Recent Labs    11/01/22 1528  TSH 5.370*   Anemia work up: No results for input(s): "VITAMINB12", "FOLATE", "FERRITIN",  "TIBC", "IRON", "RETICCTPCT" in the last 72 hours. Sepsis Labs: Recent Labs  Lab 11/01/22 1528 11/02/22 2338  WBC 9.1 9.1   Microbiology No results found for this or any previous visit (from the past 240 hour(s)).   Medications:    atorvastatin  40 mg Oral Daily   bisoprolol  15 mg Oral Daily   calcium carbonate  1 tablet Oral Q breakfast   cholecalciferol  2,000 Units Oral Daily   furosemide  20 mg Oral Daily   lamoTRIgine  25 mg Oral UD   levETIRAcetam  750 mg Oral BID   levothyroxine  88 mcg Oral Q0600   loratadine  10 mg Oral Daily   magnesium oxide  400 mg Oral Daily   multivitamin  1 tablet Oral BID   multivitamin with minerals  1 tablet Oral Daily   omega-3 acid ethyl esters  1 capsule Oral BID   potassium chloride  10 mEq Oral Daily   potassium chloride SA  40 mEq Oral Once   venlafaxine XR  37.5 mg Oral Q breakfast   Continuous Infusions:  0.9 % NaCl with KCl 20 mEq / L 100 mL/hr at 11/03/22 0334      LOS: 0 days   Charlynne Cousins  [facility]  11/03/2022, 7:28 AM

## 2022-11-03 NOTE — Progress Notes (Signed)
Initial Nutrition Assessment  DOCUMENTATION CODES:  Not applicable  INTERVENTION:  Once diet resumes, recommend: Regular diet Ensure Enlive po BID, each supplement provides 350 kcal and 20 grams of protein. MVI with minerals daily  NUTRITION DIAGNOSIS:  Increased nutrient needs related to post-op healing, hip fracture as evidenced by estimated needs.  GOAL:  Patient will meet greater than or equal to 90% of their needs  MONITOR:  Diet advancement, Labs, Weight trends, Skin  REASON FOR ASSESSMENT:  Consult Hip fracture protocol  ASSESSMENT:  Pt admitted from home after a fall leading to L femur fracture. PMH significant for HTN, dyslipidemia, hypothyroidism, seizure disorder, CVA, tobacco use, COPD, paroxysmal afib, s/p TAVR.  Pt off unit in OR for IMN of L femur. Unable to obtain detailed nutrition related history at this time.   No documented meal completions d/t NPO status. Will continue to monitor PO intake and supplement acceptance.   Reviewed weight history. Noted fluctuations between 50-54 kg within the last year. No significant weight changes noted. Current weight is 54.4 kg.   Medications: calcium carbonate, Vitamin D3, lasix, lovaza, mag-ox IV drips: NaCl with KCl @ 167m/hr  Labs: potassium 3.0  NUTRITION - FOCUSED PHYSICAL EXAM: Pt off unit in OR. Deferred to follow up.   Diet Order:   Diet Order             Diet NPO time specified Except for: Sips with Meds, Ice Chips  Diet effective now                   EDUCATION NEEDS:  No education needs have been identified at this time  Skin:  Skin Assessment: Reviewed RN Assessment  Last BM:  3/6  Height:  Ht Readings from Last 1 Encounters:  11/03/22 '5\' 2"'$  (1.575 m)    Weight:  Wt Readings from Last 1 Encounters:  11/03/22 54.4 kg   BMI:  Body mass index is 21.94 kg/m.  Estimated Nutritional Needs:   Kcal:  1350-1550  Protein:  70-85g  Fluid:  1.4-1.6L  AClayborne Dana RDN,  LDN Clinical Nutrition

## 2022-11-04 DIAGNOSIS — S72142A Displaced intertrochanteric fracture of left femur, initial encounter for closed fracture: Secondary | ICD-10-CM | POA: Diagnosis not present

## 2022-11-04 DIAGNOSIS — E876 Hypokalemia: Secondary | ICD-10-CM | POA: Diagnosis not present

## 2022-11-04 LAB — COMPREHENSIVE METABOLIC PANEL
ALT: 16 U/L (ref 0–44)
AST: 27 U/L (ref 15–41)
Albumin: 2.3 g/dL — ABNORMAL LOW (ref 3.5–5.0)
Alkaline Phosphatase: 63 U/L (ref 38–126)
Anion gap: 7 (ref 5–15)
BUN: 21 mg/dL (ref 8–23)
CO2: 27 mmol/L (ref 22–32)
Calcium: 7.8 mg/dL — ABNORMAL LOW (ref 8.9–10.3)
Chloride: 100 mmol/L (ref 98–111)
Creatinine, Ser: 0.78 mg/dL (ref 0.44–1.00)
GFR, Estimated: >60 mL/min (ref >60)
Glucose, Bld: 148 mg/dL — ABNORMAL HIGH (ref 70–99)
Potassium: 4 mmol/L (ref 3.5–5.1)
Sodium: 134 mmol/L — ABNORMAL LOW (ref 135–145)
Total Bilirubin: 0.5 mg/dL (ref 0.3–1.2)
Total Protein: 5.3 g/dL — ABNORMAL LOW (ref 6.5–8.1)

## 2022-11-04 LAB — CBC
HCT: 27.6 % — ABNORMAL LOW (ref 36.0–46.0)
Hemoglobin: 9.1 g/dL — ABNORMAL LOW (ref 12.0–15.0)
MCH: 32.9 pg (ref 26.0–34.0)
MCHC: 33 g/dL (ref 30.0–36.0)
MCV: 99.6 fL (ref 80.0–100.0)
Platelets: 169 10*3/uL (ref 150–400)
RBC: 2.77 MIL/uL — ABNORMAL LOW (ref 3.87–5.11)
RDW: 13.3 % (ref 11.5–15.5)
WBC: 13.5 10*3/uL — ABNORMAL HIGH (ref 4.0–10.5)
nRBC: 0 % (ref 0.0–0.2)

## 2022-11-04 MED ORDER — APIXABAN 5 MG PO TABS
5.0000 mg | ORAL_TABLET | Freq: Two times a day (BID) | ORAL | Status: DC
  Administered 2022-11-04 – 2022-11-08 (×9): 5 mg via ORAL
  Filled 2022-11-04 (×9): qty 1

## 2022-11-04 MED ORDER — SODIUM CHLORIDE 0.9 % IV BOLUS
500.0000 mL | Freq: Once | INTRAVENOUS | Status: AC
  Administered 2022-11-04: 500 mL via INTRAVENOUS

## 2022-11-04 NOTE — Evaluation (Signed)
Physical Therapy Evaluation Patient Details Name: Tammie Martin MRN: [id number] DOB: 1951/06/25 Today's Date: 11/04/2022  History of Present Illness  Tammie Martin is a 72 y.o. female admitted 3/6 after a mechanical fall resulting in a left interrcochanteric femur fracture. Past medical history significant for essential hypertension, hypothyroidism seizure disorder history of CVA, paroxysmal atrial fibrillation on Eliquis status post TAVR.  Clinical Impression  Patient is s/p above surgery resulting in functional limitations due to the deficits listed below (see PT Problem List). Used rollator PTA for mobility out of apartment. Reports previous falls.States limited support available from daughter. Min assist for bed mobility, transfer, and short distance gait in room this afternoon. BP 100/77 after transfer, SpO2 down to 85% on 1L, increased to 90% on 2L, RN notified. Would benefit from short term rehab at SNF prior to returning home due to limited help and current need for min assist with mobility. Patient will benefit from skilled PT to increase their independence and safety with mobility to allow discharge to the venue listed below.          Recommendations for follow up therapy are one component of a multi-disciplinary discharge planning process, led by the attending physician.  Recommendations may be updated based on patient status, additional functional criteria and insurance authorization.  Follow Up Recommendations Skilled nursing-short term rehab (<3 hours/day) Can patient physically be transported by private vehicle: Yes    Assistance Recommended at Discharge Intermittent Supervision/Assistance  Patient can return home with the following  A little help with walking and/or transfers;A little help with bathing/dressing/bathroom;Assistance with cooking/housework;Assist for transportation;Help with stairs or ramp for entrance    Equipment Recommendations None recommended by PT   Recommendations for Other Services       Functional Status Assessment Patient has had a recent decline in their functional status and demonstrates the ability to make significant improvements in function in a reasonable and predictable amount of time.     Precautions / Restrictions Precautions Precautions: Fall Restrictions Weight Bearing Restrictions: Yes LLE Weight Bearing: Weight bearing as tolerated      Mobility  Bed Mobility Overal bed mobility: Needs Assistance Bed Mobility: Supine to Sit     Supine to sit: Min assist     General bed mobility comments: min assist for LLE support out of bed, educated on use of RLE to assist as able.    Transfers Overall transfer level: Needs assistance Equipment used: Rolling walker (2 wheels) Transfers: Sit to/from Stand Sit to Stand: Min assist           General transfer comment: Min assist for boost to stand, tolerated fair WB through LLE. Cues for hand placement.    Ambulation/Gait Ambulation/Gait assistance: Min assist Gait Distance (Feet): 3 Feet Assistive device: Rolling walker (2 wheels) Gait Pattern/deviations: Step-to pattern, Decreased stride length, Antalgic Gait velocity: decr Gait velocity interpretation: <1.31 ft/sec, indicative of household ambulator   General Gait Details: Educated on AD use with RW, cues for proximity and UE support as needed to decrease WB as needed. Short distance to recliner. LOB while backing up needed min assist to correct balance.  Stairs            Wheelchair Mobility    Modified Rankin (Stroke Patients Only)       Balance Overall balance assessment: Needs assistance Sitting-balance support: Feet supported, No upper extremity supported Sitting balance-Leahy Scale: Good     Standing balance support: Single extremity supported Standing balance-Leahy Scale: Poor  Pertinent Vitals/Pain Pain Assessment Pain Assessment:  No/denies pain Pain Intervention(s): Monitored during session    Home Living Family/patient expects to be discharged to:: Private residence Living Arrangements: Children (Daughter) Available Help at Discharge: Family;Available 24 hours/day Type of Home: Apartment Home Access: Level entry;Elevator       Home Layout: One level Home Equipment: Air cabin crew (4 wheels);Grab bars - tub/shower      Prior Function Prior Level of Function : Needs assist             Mobility Comments: Uses rollator out of apartment. ADLs Comments: Daughter helped with cooking and finances, does not drive.     Hand Dominance   Dominant Hand: Right    Extremity/Trunk Assessment   Upper Extremity Assessment Upper Extremity Assessment: Generalized weakness    Lower Extremity Assessment Lower Extremity Assessment: Defer to PT evaluation LLE Deficits / Details: guarded as expected post-op    Cervical / Trunk Assessment Cervical / Trunk Assessment: Normal  Communication   Communication: No difficulties  Cognition Arousal/Alertness: Awake/alert Behavior During Therapy: WFL for tasks assessed/performed Overall Cognitive Status: Within Functional Limits for tasks assessed                                          General Comments General comments (skin integrity, edema, etc.): Sister present for session and very supportive    Exercises     Assessment/Plan    PT Assessment Patient needs continued PT services  PT Problem List Decreased strength;Decreased range of motion;Decreased activity tolerance;Decreased balance;Decreased mobility;Decreased knowledge of use of DME;Decreased knowledge of precautions;Cardiopulmonary status limiting activity;Pain       PT Treatment Interventions DME instruction;Gait training;Functional mobility training;Therapeutic exercise;Therapeutic activities;Balance training;Neuromuscular re-education;Patient/family education;Modalities    PT  Goals (Current goals can be found in the Care Plan section)  Acute Rehab PT Goals Patient Stated Goal: Get well before going home PT Goal Formulation: With patient Time For Goal Achievement: 11/18/22 Potential to Achieve Goals: Good    Frequency Min 5X/week     Co-evaluation               AM-PAC PT "6 Clicks" Mobility  Outcome Measure Help needed turning from your back to your side while in a flat bed without using bedrails?: None Help needed moving from lying on your back to sitting on the side of a flat bed without using bedrails?: A Little Help needed moving to and from a bed to a chair (including a wheelchair)?: A Little Help needed standing up from a chair using your arms (e.g., wheelchair or bedside chair)?: A Little Help needed to walk in hospital room?: A Little Help needed climbing 3-5 steps with a railing? : A Lot 6 Click Score: 18    End of Session Equipment Utilized During Treatment: Gait belt;Oxygen Activity Tolerance: Patient tolerated treatment well Patient left: in chair;with call bell/phone within reach;with chair alarm set;with family/visitor present Nurse Communication: Mobility status (drop in O2) PT Visit Diagnosis: Unsteadiness on feet (R26.81);Other abnormalities of gait and mobility (R26.89);Muscle weakness (generalized) (M62.81);History of falling (Z91.81);Repeated falls (R29.6);Difficulty in walking, not elsewhere classified (R26.2);Pain Pain - Right/Left: Left Pain - part of body: Hip    Time: [id number] PT Time Calculation (min) (ACUTE ONLY): 38 min   Charges:   PT Evaluation $PT Eval Low Complexity: 1 Low PT Treatments $Therapeutic Activity: 23-37 mins  Tammie Martin, PT, DPT Physical Therapist Acute Rehabilitation Services [facility]   Ellouise Newer 11/04/2022, 5:46 PM

## 2022-11-04 NOTE — Evaluation (Signed)
Occupational Therapy Evaluation Patient Details Name: NICHOLA IDEN MRN: [id number] DOB: 02/08/1951 Today's Date: 11/04/2022   History of Present Illness Tammie Martin is a 72 y.o. female admitted 3/6 after a mechanical fall resulting in a left interrcochanteric femur fracture. Past medical history significant for essential hypertension, hypothyroidism seizure disorder history of CVA, paroxysmal atrial fibrillation on Eliquis status post TAVR.   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 At this time, Sky requires Min A with ambulation and mod A with ADL tasks due to below deficits. Recommend rehab at SNF to Maximize functional level of independence and facilitate a safe DC home. Acute OT to follow.      Recommendations for follow up therapy are one component of a multi-disciplinary discharge planning process, led by the attending physician.  Recommendations may be updated based on patient status, additional functional criteria and insurance authorization.   Follow Up Recommendations  Skilled nursing-short term rehab (<3 hours/day)     Assistance Recommended at Discharge Frequent or constant Supervision/Assistance  Patient can return home with the following A little help with walking and/or transfers;Assistance with cooking/housework;Direct supervision/assist for medications management;Direct supervision/assist for financial management;Assist for transportation;Help with stairs or ramp for entrance;A lot of help with bathing/dressing/bathroom    Functional Status Assessment  Patient has had a recent decline in their functional status and demonstrates the ability to make significant improvements in function in a reasonable and predictable amount of time.  Equipment Recommendations  BSC/3in1    Recommendations for Other Services       Precautions /  Restrictions Precautions Precautions: Fall Restrictions Weight Bearing Restrictions: Yes LLE Weight Bearing: Weight bearing as tolerated      Mobility Bed Mobility               General bed mobility comments: OOB in chair    Transfers Overall transfer level: Needs assistance   Transfers: Sit to/from Stand Sit to Stand: Min assist                  Balance Overall balance assessment: Needs assistance, History of Falls   Sitting balance-Leahy Scale: Fair       Standing balance-Leahy Scale: Poor                             ADL either performed or assessed with clinical judgement   ADL Overall ADL's : Needs assistance/impaired     Grooming: Set up   Upper Body Bathing: Set up;Sitting   Lower Body Bathing: Moderate assistance;Sit to/from stand   Upper Body Dressing : Set up;Sitting;Supervision/safety   Lower Body Dressing: Moderate assistance;Sit to/from stand   Toilet Transfer: Minimal assistance;Rolling walker (2 wheels);Ambulation;BSC/3in1 (over toilet); ambulated into the bathroom with min A and cuing for sequencing   Toileting- Clothing Manipulation and Hygiene: Maximal assistance       Functional mobility during ADLs: Minimal assistance;Cueing for sequencing;Cueing for safety;Rolling walker (2 wheels)       Vision Baseline Vision/History: 1 Wears glasses Additional Comments: pt reports "poor perception"     Perception Perception Comments: "poor perception" per pt   Praxis      Pertinent Vitals/Pain Pain Assessment Pain Assessment: Faces Faces Pain Scale: Hurts even more Pain Location: L hip Pain Descriptors / Indicators: Aching, Grimacing, Guarding, Moaning Pain Intervention(s): Limited activity within patient's  tolerance, Repositioned, Ice applied, Patient requesting pain meds-RN notified     Hand Dominance Right   Extremity/Trunk Assessment Upper Extremity Assessment Upper Extremity Assessment: Generalized  weakness   Lower Extremity Assessment Lower Extremity Assessment: Defer to PT evaluation   Cervical / Trunk Assessment Cervical / Trunk Assessment: Normal   Communication Communication Communication: No difficulties   Cognition Arousal/Alertness: Awake/alert Behavior During Therapy: WFL for tasks assessed/performed Overall Cognitive Status: History of cognitive impairments - at baseline                                 General Comments: difficulty with memory and states she has difficulty "paying atteniton", especially when there are distractions; daughter has to assist with medicaiton management for this reason; noted difficulty with complex multistep tasks     General Comments  Sister present for session and very supportive; VSS on 2L; Max HR 122 with activity    Exercises     Shoulder Instructions      Home Living Family/patient expects to be discharged to:: Private residence Living Arrangements: Children (Daughter) Available Help at Discharge: Family;Available 24 hours/day Type of Home: Apartment Home Access: Level entry;Elevator     Home Layout: One level     Bathroom Shower/Tub: Tub/shower unit         Home Equipment: Air cabin crew (4 wheels);Grab bars - tub/shower          Prior Functioning/Environment Prior Level of Function : Needs assist             Mobility Comments: Uses rollator out of apartment. ADLs Comments: Daughter helped with cooking and finances, does not drive.        OT Problem List: Decreased strength;Decreased range of motion;Decreased activity tolerance;Impaired balance (sitting and/or standing);Decreased safety awareness;Decreased knowledge of use of DME or AE;Decreased cognition;Decreased knowledge of precautions;Pain      OT Treatment/Interventions: Self-care/ADL training;DME and/or AE instruction;Therapeutic activities;Cognitive remediation/compensation;Patient/family education;Balance training    OT  Goals(Current goals can be found in the care plan section) Acute Rehab OT Goals Patient Stated Goal: to get better and go back home OT Goal Formulation: With patient Time For Goal Achievement: 11/18/22 Potential to Achieve Goals: Good  OT Frequency: Min 2X/week    Co-evaluation              AM-PAC OT "6 Clicks" Daily Activity     Outcome Measure Help from another person eating meals?: None Help from another person taking care of personal grooming?: A Little Help from another person toileting, which includes using toliet, bedpan, or urinal?: A Lot Help from another person bathing (including washing, rinsing, drying)?: A Lot Help from another person to put on and taking off regular upper body clothing?: A Little Help from another person to put on and taking off regular lower body clothing?: A Lot 6 Click Score: 16   End of Session Equipment Utilized During Treatment: Gait belt;Rolling walker (2 wheels);Oxygen (2L) Nurse Communication: Mobility status;Patient requests pain meds;Weight bearing status;Precautions  Activity Tolerance: Patient tolerated treatment well Patient left: in chair;with call bell/phone within reach;with chair alarm set;with family/visitor present  OT Visit Diagnosis: Unsteadiness on feet (R26.81);Other abnormalities of gait and mobility (R26.89);Muscle weakness (generalized) (M62.81);History of falling (Z91.81);Other symptoms and signs involving cognitive function;Pain Pain - Right/Left: Left Pain - part of body: Hip                Time: 1410-1441 OT Time Calculation (  min): 31 min Charges:  OT General Charges $OT Visit: 1 Visit OT Evaluation $OT Eval Moderate Complexity: 1 Mod OT Treatments $Self Care/Home Management : 8-22 mins  Maurie Boettcher, OT/L   Acute OT Clinical Specialist Acute Rehabilitation Services Pager [phone] Office [phone]   [facility] 11/04/2022, 4:07 PM

## 2022-11-04 NOTE — Progress Notes (Signed)
Transition of Care [facility]) - CAGE-AID Screening   Patient Details  Name: Tammie Martin MRN: [id number] Date of Birth: 1951-01-04   Clinical Narrative:  Pt admitted post fall. Pt denies any alcohol use in nearly 20 years.    CAGE-AID Screening:    Have You Ever Felt You Ought to Cut Down on Your Drinking or Drug Use?: No Have People Annoyed You By Critizing Your Drinking Or Drug Use?: No Have You Felt Bad Or Guilty About Your Drinking Or Drug Use?: No Have You Ever Had a Drink or Used Drugs First Thing In The Morning to Steady Your Nerves or to Get Rid of a Hangover?: No CAGE-AID Score: 0  Substance Abuse Education Offered: No

## 2022-11-04 NOTE — Progress Notes (Signed)
Hgb came back 9.1 POD1. Ok to resume home apixaban per Dr. Aileen Fass.  Onnie Boer, PharmD, BCIDP, AAHIVP, CPP Infectious Disease Pharmacist 11/04/2022 12:38 PM

## 2022-11-04 NOTE — Progress Notes (Signed)
     Tammie Martin is a 72 y.o. female   Orthopaedic diagnosis: Left intertrochanteric hip fracture Surgery: Cephalomedullary nail of left hip fracture 11-03-22  Subjective: Patient doing well.  Pain well-controlled.  Soft BPs noted but she feels well.  She has questions about resuming Eliquis.  She has not been out of bed yet.  No lab work yet today.  She had issues with constipation preoperatively.  No abdominal pain shortness of breath or chest pain.  Objectyive: Vitals:   11/04/22 0400 11/04/22 0700  BP: (!) 91/59 (!) 89/72  Pulse:  87  Resp: 20 19  Temp:    SpO2: 92%      Exam: Awake and alert Respirations even and unlabored No acute distress  Left hip shows clean, dry, intact surgical dressing.  The dressing was not removed.  Mild hip swelling.  No gross deformity.  She has pain with attempted range of motion at the hip.  He is able to gently flex and extend the knee.  5 out of 5 strength at the ankle.  Neurovascular intact distally.  Calf soft nontender with SCDs in place.  Exposed skin is benign.  Assessment: Postop day 1 status post the above, doing well  Plan: -Weightbearing as tolerated left lower extremity with walker -Keep dressing clean, dry, intact -Work with therapy today -CBC/CMP ordered.  Okay to resume Eliquis from an orthopedic standpoint once hemoglobin stable.  This will also serve for DVT prophylaxis. -Pain control as needed.  Plan to continue hydrocodone outpatient.  Disposition per primary team.  Pending therapy evaluation.  She will likely benefit from SNF placement.  Plan for outpatient follow-up 2 weeks from surgery at [facility] with Dr. Lucia Gaskins for reevaluation, radiographs of the left hip, and suture removal if appropriate.   Broxton Broady J. Martinique, PA-C

## 2022-11-04 NOTE — Progress Notes (Signed)
[facility] PROGRESS NOTE    Progress Note  Tammie Martin  [id number] DOB: 24-Jun-1951 DOA: 11/02/2022 PCP: Janora Norlander, DO     Brief Narrative:   Tammie Martin is an 72 y.o. female past medical history significant for essential hypertension, hypothyroidism seizure disorder history of CVA, paroxysmal atrial fibrillation on Eliquis status post TAVR is comes into the ED after a mechanical fall x-ray confirmed close displaced intertrochanteric femur fracture on the left    Assessment/Plan:   Closed displaced intertrochanteric fracture of left femur [facility]) Orthopedic surgery was consulted she is status post cephalomedullary nail of the left hip On a regular diet, resume Eliquis. Continue to aquatics for pain control, she was started on a bowel regimen. Anticipate will need skilled nursing facility.  Hypokalemia Replete orally, basic metabolic panels pending this morning.  A-fib (HCC) Rate controlled, continue beta-blockers. Resume Eliquis. Try to optimize electrolytes.   Labs are pending this morning.  Seizure disorder (HCC) Continue Keppra and Lamictal.  Hypothyroidism Continue Synthroid.  Dyslipidemia Continue Lovaza.  Essential hypertension, benign Continue Zebeta as it would offer perioperative cardiovascular risk reduction.  Chronic obstructive pulmonary disease (COPD) ([place]) Setting 90% on room air continue inhalers stable.  Anxiety and depression Current regimen.   DVT prophylaxis: lovenox Family Communication:none Status is: Inpatient Remains inpatient appropriate because: Left hip fracture    Code Status:     Code Status Orders  (From admission, onward)           Start     Ordered   11/03/22 0225  Do not attempt resuscitation (DNR)  Continuous       Question Answer Comment  If patient has no pulse and is not breathing Do Not Attempt Resuscitation   If patient has a pulse and/or is breathing: Medical Treatment Goals  MEDICAL INTERVENTIONS DESIRED: Use advanced airway interventions, mechanical ventilation or cardioversion in appropriate circumstances; Use medication/IV fluids as indicated; Provide comfort medications; Transfer to Progressive/Stepdown/ICU as indicated.   Consent: Discussion documented in EHR or advanced directives reviewed      11/03/22 0224           Code Status History     Date Active Date Inactive Code Status Order ID Comments User Context   11/03/2022 0123 11/03/2022 0224 Full Code [id number]  Mansy, Arvella Merles, MD ED   02/07/2022 1010 02/11/2022 1933 Full Code [id number]  Germain Osgood, PA-C Inpatient   02/07/2022 0957 02/07/2022 1010 Full Code [id number]  Rolla Plate, DO Inpatient   02/04/2022 1757 02/06/2022 1808 Full Code [id number]  Elgergawy, Silver Huguenin, MD ED   02/01/2022 1432 02/02/2022 2016 Full Code [id number]  Eileen Stanford, PA-C Inpatient   11/22/2021 0933 11/22/2021 1756 Full Code [id number]  Sherren Mocha, MD Inpatient         IV Access:   Peripheral IV   Procedures and diagnostic studies:   DG FEMUR MIN 2 VIEWS LEFT  Result Date: 11/03/2022 CLINICAL DATA:  Intramedullary rod fixation of left femur. EXAM: LEFT FEMUR 2 VIEWS; DG C-ARM 1-60 MIN-NO REPORT Radiation exposure index: 8.72 mGy. COMPARISON:  November 02, 2022. FINDINGS: Six intraoperative fluoroscopic images were obtained of the left hip. These images demonstrate surgical internal fixation proximal left femoral intertrochanteric fracture. IMPRESSION: Fluoroscopic guidance provided during surgical internal fixation of proximal left femoral intertrochanteric fracture. Electronically Signed   By: Marijo Conception M.D.   On: 11/03/2022 13:08   DG C-Arm 1-60 Min-No Report  Result Date: 11/03/2022 Fluoroscopy was utilized  by the requesting physician.  No radiographic interpretation.   DG Shoulder Left  Result Date: 11/03/2022 CLINICAL DATA:  Recent fall with left shoulder pain, initial encounter EXAM: LEFT SHOULDER - 2  VIEW COMPARISON:  None Available. FINDINGS: Mild degenerative changes of the acromioclavicular joint are seen. No acute fracture or dislocation is noted. No soft tissue abnormality is seen. IMPRESSION: No acute abnormality noted. Electronically Signed   By: Inez Catalina M.D.   On: 11/03/2022 00:15   DG Hips Bilat W or Wo Pelvis 5 Views  Result Date: 11/03/2022 CLINICAL DATA:  Left hip pain following fall, initial encounter EXAM: DG HIP (WITH OR WITHOUT PELVIS) 5+V BILAT COMPARISON:  None Available. FINDINGS: Pelvic ring is intact. Mildly displaced left intratrochanteric fracture is seen. The proximal right femur appears within normal limits. No soft tissue abnormality is noted. IMPRESSION: Left intratrochanteric fracture Electronically Signed   By: Inez Catalina M.D.   On: 11/03/2022 00:15   CT Head Wo Contrast  Result Date: 11/02/2022 CLINICAL DATA:  Head trauma, minor (Age >= 65y).  Fall EXAM: CT HEAD WITHOUT CONTRAST TECHNIQUE: Contiguous axial images were obtained from the base of the skull through the vertex without intravenous contrast. RADIATION DOSE REDUCTION: This exam was performed according to the departmental dose-optimization program which includes automated exposure control, adjustment of the mA and/or kV according to patient size and/or use of iterative reconstruction technique. COMPARISON:  None Available. FINDINGS: Brain: There is atrophy and chronic small vessel disease changes. No acute intracranial abnormality. Specifically, no hemorrhage, hydrocephalus, mass lesion, acute infarction, or significant intracranial injury. Vascular: No hyperdense vessel or unexpected calcification. Skull: No acute calvarial abnormality. Sinuses/Orbits: No acute findings Other: None IMPRESSION: Atrophy, chronic microvascular disease. No acute intracranial abnormality. Electronically Signed   By: Rolm Baptise M.D.   On: 11/02/2022 23:59     Medical Consultants:   None.   Subjective:    Tammie Martin  Runion pain is controlled tolerating her diet has not had a bowel movement.  Objective:    Vitals:   11/03/22 1925 11/04/22 0400 11/04/22 0700 11/04/22 0755  BP: (!) 90/58 (!) 91/59 (!) 89/72 96/72  Pulse: 93  87   Resp: (!) '25 20 19   '$ Temp: 97.7 F (36.5 C)     TempSrc: Oral     SpO2: 91% 92%    Weight:      Height:       SpO2: 92 % O2 Flow Rate (L/min): 2 L/min   Intake/Output Summary (Last 24 hours) at 11/04/2022 0815 Last data filed at 11/03/2022 1900 Gross per 24 hour  Intake 600 ml  Output 1550 ml  Net -950 ml    Filed Weights   11/03/22 0315 11/03/22 1103  Weight: 54.4 kg 54.4 kg    Exam: General exam: In no acute distress. Respiratory system: Good air movement and clear to auscultation. Cardiovascular system: S1 & S2 heard, RRR. No JVD. Gastrointestinal system: Abdomen is nondistended, soft and nontender.  Extremities: No pedal edema. Skin: No rashes, lesions or ulcers Psychiatry: Judgement and insight appear normal. Mood & affect appropriate.   Data Reviewed:    Labs: Basic Metabolic Panel: Recent Labs  Lab 11/01/22 1523 11/02/22 2338 11/03/22 0421 11/03/22 0804  NA 143 139 139 139  K 3.4* 3.0* 2.8* 3.0*  CL 95* 98 98 101  CO2 31* 33* 32 32  GLUCOSE 118* 99 114* 109*  BUN '19 22 15 12  '$ CREATININE 0.83 0.71 0.63 0.57  CALCIUM 9.9  8.6* 8.3* 8.1*  MG  --  2.0  --   --     GFR Estimated Creatinine Clearance: 49.5 mL/min (by C-G formula based on SCr of 0.57 mg/dL). Liver Function Tests: Recent Labs  Lab 11/02/22 2338  AST 24  ALT 18  ALKPHOS 80  BILITOT 0.3  PROT 6.6  ALBUMIN 3.0*    No results for input(s): "LIPASE", "AMYLASE" in the last 168 hours. No results for input(s): "AMMONIA" in the last 168 hours. Coagulation profile No results for input(s): "INR", "PROTIME" in the last 168 hours. COVID-19 Labs  No results for input(s): "DDIMER", "FERRITIN", "LDH", "CRP" in the last 72 hours.  Lab Results  Component Value Date    SARSCOV2NAA NEGATIVE 02/07/2022   [place] NEGATIVE 01/28/2022    CBC: Recent Labs  Lab 11/01/22 1528 11/02/22 2338 11/03/22 0421  WBC 9.1 9.1 9.9  NEUTROABS  --  5.5  --   HGB 13.8 11.7* 11.6*  HCT 40.1 36.2 34.5*  MCV 96 100.8* 97.5  PLT 254 218 201    Cardiac Enzymes: No results for input(s): "CKTOTAL", "CKMB", "CKMBINDEX", "TROPONINI" in the last 168 hours. BNP (last 3 results) No results for input(s): "PROBNP" in the last 8760 hours. CBG: No results for input(s): "GLUCAP" in the last 168 hours. D-Dimer: No results for input(s): "DDIMER" in the last 72 hours. Hgb A1c: No results for input(s): "HGBA1C" in the last 72 hours. Lipid Profile: No results for input(s): "CHOL", "HDL", "LDLCALC", "TRIG", "CHOLHDL", "LDLDIRECT" in the last 72 hours. Thyroid function studies: Recent Labs    11/01/22 1528  TSH 5.370*    Anemia work up: No results for input(s): "VITAMINB12", "FOLATE", "FERRITIN", "TIBC", "IRON", "RETICCTPCT" in the last 72 hours. Sepsis Labs: Recent Labs  Lab 11/01/22 1528 11/02/22 2338 11/03/22 0421  WBC 9.1 9.1 9.9    Microbiology Recent Results (from the past 240 hour(s))  MRSA Next Gen by PCR, Nasal     Status: Abnormal   Collection Time: 11/03/22 11:06 AM   Specimen: Nasal Mucosa; Nasal Swab  Result Value Ref Range Status   MRSA by PCR Next Gen DETECTED (A) NOT DETECTED Final    Comment: CRITICAL RESULT CALLED TO, READ BACK BY AND VERIFIED WITH: RN Felix Pacini [id number] JRS (NOTE) The GeneXpert MRSA Assay (FDA approved for NASAL specimens only), is one component of a comprehensive MRSA colonization surveillance program. It is not intended to diagnose MRSA infection nor to guide or monitor treatment for MRSA infections. Test performance is not FDA approved in patients less than 91 years old. Performed at [facility], [place] [address] [place], [place] [address]      Medications:    atorvastatin  40 mg Oral Daily    bisoprolol  20 mg Oral Daily   calcium carbonate  1 tablet Oral Q breakfast   cholecalciferol  2,000 Units Oral Daily   feeding supplement  237 mL Oral BID BM   furosemide  20 mg Oral Daily   lamoTRIgine  100 mg Oral BID   levETIRAcetam  750 mg Oral BID   levothyroxine  88 mcg Oral Q0600   loratadine  10 mg Oral Daily   magnesium oxide  400 mg Oral Daily   multivitamin  1 tablet Oral BID   multivitamin with minerals  1 tablet Oral Daily   omega-3 acid ethyl esters  1 capsule Oral BID   polyethylene glycol  17 g Oral BID   potassium chloride  10 mEq Oral  Daily   potassium chloride SA  40 mEq Oral Once   venlafaxine XR  37.5 mg Oral Q breakfast   Continuous Infusions:  0.9 % NaCl with KCl 20 mEq / L 100 mL/hr at 11/04/22 0248   methocarbamol (ROBAXIN) IV        LOS: 1 day   Charlynne Cousins  [facility]  11/04/2022, 8:15 AM

## 2022-11-04 NOTE — Progress Notes (Signed)
PT Cancellation Note  Patient Details Name: CORALIA STECKLEIN MRN: [id number] DOB: Nov 30, 1950   Cancelled Treatment:    Reason Eval/Treat Not Completed: Medical issues which prohibited therapy  Order acknowledged. Introduced myself and role of physical therapy during acute admission. Checked BP - 80/56, MAP 65, HR 100, asymptomatic in bed with HOB elevated. Lied supine briefly with BP improvement to 90/64 and repositioned in bed for comfort, however she just finished eating and did I not want to leave in this position so elevated HOB modestly. RN notified of hypotension. Will follow up later when BP more conducive for comprehensive PT evaluation, will plan to get OOB and assess mobility as able.   Candie Mile, PT, DPT Physical Therapist Acute Rehabilitation Services [facility]  11/04/2022, 10:41 AM

## 2022-11-04 NOTE — Discharge Instructions (Signed)

## 2022-11-04 NOTE — Plan of Care (Signed)

## 2022-11-05 DIAGNOSIS — E876 Hypokalemia: Secondary | ICD-10-CM | POA: Diagnosis not present

## 2022-11-05 DIAGNOSIS — S72142A Displaced intertrochanteric fracture of left femur, initial encounter for closed fracture: Secondary | ICD-10-CM | POA: Diagnosis not present

## 2022-11-05 LAB — BASIC METABOLIC PANEL
Anion gap: 5 (ref 5–15)
BUN: 24 mg/dL — ABNORMAL HIGH (ref 8–23)
CO2: 30 mmol/L (ref 22–32)
Calcium: 7.8 mg/dL — ABNORMAL LOW (ref 8.9–10.3)
Chloride: 102 mmol/L (ref 98–111)
Creatinine, Ser: 0.61 mg/dL (ref 0.44–1.00)
GFR, Estimated: >60 mL/min (ref >60)
Glucose, Bld: 100 mg/dL — ABNORMAL HIGH (ref 70–99)
Potassium: 3.7 mmol/L (ref 3.5–5.1)
Sodium: 137 mmol/L (ref 135–145)

## 2022-11-05 MED ORDER — POLYETHYLENE GLYCOL 3350 17 G PO PACK
17.0000 g | PACK | Freq: Two times a day (BID) | ORAL | Status: AC
  Administered 2022-11-05 – 2022-11-06 (×4): 17 g via ORAL
  Filled 2022-11-05 (×4): qty 1

## 2022-11-05 NOTE — Progress Notes (Signed)
[facility] PROGRESS NOTE    Progress Note  Tammie Martin  [id number] DOB: [birth year] DOA: 11/02/2022 PCP: Janora Norlander, DO     Brief Narrative:   Tammie Martin is an 72 y.o. female past medical history significant for essential hypertension, hypothyroidism seizure disorder history of CVA, paroxysmal atrial fibrillation on Eliquis status post TAVR is comes into the ED after a mechanical fall x-ray confirmed close displaced intertrochanteric femur fracture on the left   Assessment/Plan:   Closed displaced intertrochanteric fracture of left femur [facility]) Orthopedic surgery was consulted she is status post cephalomedullary nail of the left hip on 11/03/2022. On a regular diet, resume Eliquis. Continue narcotics for pain control. PT evaluated the patient, will need skilled nursing facility placement. TOC has been notified. Continue regimen has not had a bowel movement  Hypokalemia Replete orally, basic metabolic panels pending this morning.  A-fib (HCC) Rate controlled, continue beta-blockers. Continue Eliquis Optimize electrolytes.    Seizure disorder (HCC) Continue Keppra and Lamictal.  Hypothyroidism Continue Synthroid.  Dyslipidemia Continue Lovaza.  Essential hypertension, benign Continue Zebeta as it would offer perioperative cardiovascular risk reduction.  Chronic obstructive pulmonary disease (COPD) ([place]) Setting 90% on room air continue inhalers stable.  Anxiety and depression Current regimen.   DVT prophylaxis: lovenox Family Communication:none Status is: Inpatient Remains inpatient appropriate because: Left hip fracture    Code Status:     Code Status Orders  (From admission, onward)           Start     Ordered   11/03/22 0225  Do not attempt resuscitation (DNR)  Continuous       Question Answer Comment  If patient has no pulse and is not breathing Do Not Attempt Resuscitation   If patient has a pulse and/or is  breathing: Medical Treatment Goals MEDICAL INTERVENTIONS DESIRED: Use advanced airway interventions, mechanical ventilation or cardioversion in appropriate circumstances; Use medication/IV fluids as indicated; Provide comfort medications; Transfer to Progressive/Stepdown/ICU as indicated.   Consent: Discussion documented in EHR or advanced directives reviewed      11/03/22 0224           Code Status History     Date Active Date Inactive Code Status Order ID Comments User Context   11/03/2022 0123 11/03/2022 0224 Full Code [id number]  Mansy, Arvella Merles, MD ED   02/07/2022 1010 02/11/2022 1933 Full Code [id number]  Germain Osgood, PA-C Inpatient   02/07/2022 0957 02/07/2022 1010 Full Code [id number]  Rolla Plate, DO Inpatient   02/04/2022 1757 02/06/2022 1808 Full Code [id number]  Elgergawy, Silver Huguenin, MD ED   02/01/2022 1432 02/02/2022 2016 Full Code [id number]  Eileen Stanford, PA-C Inpatient   11/22/2021 0933 11/22/2021 1756 Full Code [id number]  Sherren Mocha, MD Inpatient         IV Access:   Peripheral IV   Procedures and diagnostic studies:   DG FEMUR MIN 2 VIEWS LEFT  Result Date: 11/03/2022 CLINICAL DATA:  Intramedullary rod fixation of left femur. EXAM: LEFT FEMUR 2 VIEWS; DG C-ARM 1-60 MIN-NO REPORT Radiation exposure index: 8.72 mGy. COMPARISON:  November 02, 2022. FINDINGS: Six intraoperative fluoroscopic images were obtained of the left hip. These images demonstrate surgical internal fixation proximal left femoral intertrochanteric fracture. IMPRESSION: Fluoroscopic guidance provided during surgical internal fixation of proximal left femoral intertrochanteric fracture. Electronically Signed   By: Marijo Conception M.D.   On: 11/03/2022 13:08   DG C-Arm 1-60 Min-No Report  Result Date: 11/03/2022 Fluoroscopy  was utilized by the requesting physician.  No radiographic interpretation.     Medical Consultants:   None.   Subjective:    [name] no pain has not had a bowel  movement.  Objective:    Vitals:   11/04/22 1700 11/04/22 1939 11/05/22 0418 11/05/22 0720  BP: 102/82 110/76 97/85   Pulse:      Resp:  20 20   Temp:  98.7 F (37.1 C) 98.4 F (36.9 C)   TempSrc:  Oral Oral   SpO2:  94% 95% 97%  Weight:      Height:       SpO2: 97 % O2 Flow Rate (L/min): 2 L/min   Intake/Output Summary (Last 24 hours) at 11/05/2022 0817 Last data filed at 11/05/2022 0444 Gross per 24 hour  Intake 662.33 ml  Output 300 ml  Net 362.33 ml    Filed Weights   11/03/22 0315 11/03/22 1103  Weight: 54.4 kg 54.4 kg    Exam: General exam: In no acute distress. Respiratory system: Good air movement and clear to auscultation. Cardiovascular system: S1 & S2 heard, RRR. No JVD. Gastrointestinal system: Abdomen is nondistended, soft and nontender.  Extremities: No pedal edema. Skin: No rashes, lesions or ulcers Psychiatry: Judgement and insight appear normal. Mood & affect appropriate. Data Reviewed:    Labs: Basic Metabolic Panel: Recent Labs  Lab 11/02/22 2338 11/03/22 0421 11/03/22 0804 11/04/22 0843 11/05/22 0428  NA 139 139 139 134* 137  K 3.0* 2.8* 3.0* 4.0 3.7  CL 98 98 101 100 102  CO2 33* 32 32 27 30  GLUCOSE 99 114* 109* 148* 100*  BUN '22 15 12 21 '$ 24*  CREATININE 0.71 0.63 0.57 0.78 0.61  CALCIUM 8.6* 8.3* 8.1* 7.8* 7.8*  MG 2.0  --   --   --   --     GFR Estimated Creatinine Clearance: 49.5 mL/min (by C-G formula based on SCr of 0.61 mg/dL). Liver Function Tests: Recent Labs  Lab 11/02/22 2338 11/04/22 0843  AST 24 27  ALT 18 16  ALKPHOS 80 63  BILITOT 0.3 0.5  PROT 6.6 5.3*  ALBUMIN 3.0* 2.3*    No results for input(s): "LIPASE", "AMYLASE" in the last 168 hours. No results for input(s): "AMMONIA" in the last 168 hours. Coagulation profile No results for input(s): "INR", "PROTIME" in the last 168 hours. COVID-19 Labs  No results for input(s): "DDIMER", "FERRITIN", "LDH", "CRP" in the last 72 hours.  Lab Results   Component Value Date   SARSCOV2NAA NEGATIVE 02/07/2022   [place] NEGATIVE 01/28/2022    CBC: Recent Labs  Lab 11/01/22 1528 11/02/22 2338 11/03/22 0421 11/04/22 0843  WBC 9.1 9.1 9.9 13.5*  NEUTROABS  --  5.5  --   --   HGB 13.8 11.7* 11.6* 9.1*  HCT 40.1 36.2 34.5* 27.6*  MCV 96 100.8* 97.5 99.6  PLT 254 218 201 169    Cardiac Enzymes: No results for input(s): "CKTOTAL", "CKMB", "CKMBINDEX", "TROPONINI" in the last 168 hours. BNP (last 3 results) No results for input(s): "PROBNP" in the last 8760 hours. CBG: No results for input(s): "GLUCAP" in the last 168 hours. D-Dimer: No results for input(s): "DDIMER" in the last 72 hours. Hgb A1c: No results for input(s): "HGBA1C" in the last 72 hours. Lipid Profile: No results for input(s): "CHOL", "HDL", "LDLCALC", "TRIG", "CHOLHDL", "LDLDIRECT" in the last 72 hours. Thyroid function studies: No results for input(s): "TSH", "T4TOTAL", "T3FREE", "THYROIDAB" in the last 72 hours.  Invalid input(s): "FREET3"  Anemia work up: No results for input(s): "VITAMINB12", "FOLATE", "FERRITIN", "TIBC", "IRON", "RETICCTPCT" in the last 72 hours. Sepsis Labs: Recent Labs  Lab 11/01/22 1528 11/02/22 2338 11/03/22 0421 11/04/22 0843  WBC 9.1 9.1 9.9 13.5*    Microbiology Recent Results (from the past 240 hour(s))  MRSA Next Gen by PCR, Nasal     Status: Abnormal   Collection Time: 11/03/22 11:06 AM   Specimen: Nasal Mucosa; Nasal Swab  Result Value Ref Range Status   MRSA by PCR Next Gen DETECTED (A) NOT DETECTED Final    Comment: CRITICAL RESULT CALLED TO, READ BACK BY AND VERIFIED WITH: RN Felix Pacini [id number] JRS (NOTE) The GeneXpert MRSA Assay (FDA approved for NASAL specimens only), is one component of a comprehensive MRSA colonization surveillance program. It is not intended to diagnose MRSA infection nor to guide or monitor treatment for MRSA infections. Test performance is not FDA approved in patients less  than 59 years old. Performed at [facility], [place] [address] [place], [place] [address]      Medications:    apixaban  5 mg Oral BID   atorvastatin  40 mg Oral Daily   bisoprolol  20 mg Oral Daily   calcium carbonate  1 tablet Oral Q breakfast   cholecalciferol  2,000 Units Oral Daily   feeding supplement  237 mL Oral BID BM   lamoTRIgine  100 mg Oral BID   levETIRAcetam  750 mg Oral BID   levothyroxine  88 mcg Oral Q0600   loratadine  10 mg Oral Daily   magnesium oxide  400 mg Oral Daily   multivitamin  1 tablet Oral BID   multivitamin with minerals  1 tablet Oral Daily   omega-3 acid ethyl esters  1 capsule Oral BID   polyethylene glycol  17 g Oral BID   potassium chloride  10 mEq Oral Daily   potassium chloride SA  40 mEq Oral Once   venlafaxine XR  37.5 mg Oral Q breakfast   Continuous Infusions:  methocarbamol (ROBAXIN) IV        LOS: 2 days   Charlynne Cousins  [facility]  11/05/2022, 8:17 AM

## 2022-11-05 NOTE — Progress Notes (Signed)
PATIENT ID: Tammie Martin  MRN: [id number]  DOB/AGE:  1950-12-08 / 72 y.o.  2 Days Post-Op Procedure(s) (LRB): INTRAMEDULLARY (IM) NAIL INTERTROCHANTERIC (Left)    PROGRESS NOTE Subjective:   Patient is alert, oriented, no Nausea, no Vomiting, yes passing gas, no Bowel Movement. Taking PO well. Denies SOB, Chest or Calf Pain. Using Incentive Spirometer, PAS in place. Ambulate wbat with pt up with therapy yesterday, Patient reports pain as moderate,     Objective: Vital signs in last 24 hours: Temp:  [98.4 F (36.9 C)-98.7 F (37.1 C)] 98.4 F (36.9 C) (03/09 0418) Resp:  [20] 20 (03/09 0418) BP: (80-110)/(56-85) 97/85 (03/09 0418) SpO2:  [94 %-95 %] 95 % (03/09 0418)    Intake/Output from previous day: I/O last 3 completed shifts: In: 662.3 [P.O.:480; IV Piggyback:182.3] Out: 300 [Urine:300]   Intake/Output this shift: No intake/output data recorded.   LABORATORY DATA: Recent Labs    11/03/22 0421 11/03/22 0804 11/04/22 0843 11/05/22 0428  WBC 9.9  --  13.5*  --   HGB 11.6*  --  9.1*  --   HCT 34.5*  --  27.6*  --   PLT 201  --  169  --   NA 139   < > 134* 137  K 2.8*   < > 4.0 3.7  CL 98   < > 100 102  CO2 32   < > 27 30  BUN 15   < > 21 24*  CREATININE 0.63   < > 0.78 0.61  GLUCOSE 114*   < > 148* 100*  CALCIUM 8.3*   < > 7.8* 7.8*   < > = values in this interval not displayed.    Examination: Neurologically intact Neurovascular intact Sensation intact distally Intact pulses distally Dorsiflexion/Plantar flexion intact Incision: dressing C/D/I and no drainage No cellulitis present Compartment soft} Pt has mild to moderate left knee pain over the superior patellar region.  4+/5 strength in knee Assessment:   2 Days Post-Op Procedure(s) (LRB): INTRAMEDULLARY (IM) NAIL INTERTROCHANTERIC (Left) ADDITIONAL DIAGNOSIS:  Hypertension, Cardiac Arrythmia Afib and aortic stenosis, and COPD  Plan:  Weight Bearing as Tolerated (WBAT)  DVT Prophylaxis:    Eliquis  DISCHARGE PLAN: Skilled Nursing Facility/Rehab  DISCHARGE NEEDS: HHPT, Walker, and 3-in-1 comode seat  -Pain control as needed.  Plan to continue hydrocodone outpatient.   Disposition per primary team.  Pending therapy evaluation.  She will likely benefit from SNF placement.   Plan for outpatient follow-up 2 weeks from surgery at [facility] with Dr. Lucia Gaskins for reevaluation, radiographs of the left hip, and suture removal if appropriate.    Joanell Rising 11/05/2022, 8:09 AM

## 2022-11-05 NOTE — Progress Notes (Signed)
Physical Therapy Treatment Patient Details Name: Tammie Martin MRN: [id number] DOB: 26-Sep-1950 Today's Date: 11/05/2022   History of Present Illness Pt is 72 yo female presenting to ED following fall. X-ray confirmed closed displaced intertrochanteric femur fracture on the L. Pt is currently s/p cephalomedullary nail of the L hip on 3/7. PMH: HTN, hypothyroidism, seizure disorder, hx VA, paroxysmal a-fib status post TAVR.    PT Comments    Pt continues below baseline slowly progressing towards goals. Pt requires Min a for bed mobility and max A for rolling in bed. She requires Min A for sit to stand with AD and was unable to progress her LLE more than 0.5-1 inch at a time for side stepping at EOB due to pain and inability to WB through the LLE. Due to pt current functional status, home set up and available assistance at home recommending skilled physical therapy services at a higher level of care and frequency on discharge from acute care hospital setting in order to decrease risk for falls, injury and re-hospitalization. Pt was found in urine and assisted with cleaning up, she was very upset initially on entering the room but was expressing feeling better by end of session.     Recommendations for follow up therapy are one component of a multi-disciplinary discharge planning process, led by the attending physician.  Recommendations may be updated based on patient status, additional functional criteria and insurance authorization.  Follow Up Recommendations  Skilled nursing-short term rehab (<3 hours/day)     Assistance Recommended at Discharge Intermittent Supervision/Assistance  Patient can return home with the following Assistance with cooking/housework;Assist for transportation;Help with stairs or ramp for entrance;A lot of help with walking and/or transfers   Equipment Recommendations  None recommended by PT    Recommendations for Other Services       Precautions / Restrictions  Precautions Precautions: Fall Restrictions Weight Bearing Restrictions: Yes LLE Weight Bearing: Weight bearing as tolerated     Mobility  Bed Mobility Overal bed mobility: Needs Assistance Bed Mobility: Supine to Sit, Sit to Supine, Rolling Rolling: Max assist   Supine to sit: Min assist Sit to supine: Min assist   General bed mobility comments: Pt requires Min A for the L LE due to recent surgery and weakness. Pt was Max A for rolling R/L to adjust pads. Patient Response: Cooperative  Transfers Overall transfer level: Needs assistance Equipment used: Rolling walker (2 wheels) Transfers: Sit to/from Stand Sit to Stand: Min assist           General transfer comment: Min assist for initial momentum to stand, tolerated fair WB through LLE. Cues for hand placement.    Ambulation/Gait Ambulation/Gait assistance: Min assist Gait Distance (Feet): 1 Feet Assistive device: Rolling walker (2 wheels)   Gait velocity: decr Gait velocity interpretation: <1.31 ft/sec, indicative of household ambulator   General Gait Details: side stepping pt was unable to clear the LLE from the floor for stepping to the side with very tiny barly an inch progression with each step taking multiple steps to step up toward [place].       Balance Overall balance assessment: Needs assistance, History of Falls Sitting-balance support: Feet supported, No upper extremity supported Sitting balance-Leahy Scale: Fair     Standing balance support: Single extremity supported Standing balance-Leahy Scale: Poor Standing balance comment: Able to stand for ~1 min before needing to sit back down EOB.        Cognition Arousal/Alertness: Awake/alert Behavior During Therapy: WFL for tasks  assessed/performed Overall Cognitive Status: History of cognitive impairments - at baseline       General Comments: Pt was stating that she lost her call bell but it was next to her. When I entered the room pt was calling  out and seemed slightly confused if anyone had been to help her or not.               Pertinent Vitals/Pain Pain Assessment Pain Assessment: Faces Faces Pain Scale: Hurts little more Breathing: normal Negative Vocalization: none Facial Expression: smiling or inexpressive Body Language: relaxed Consolability: no need to console PAINAD Score: 0 Pain Descriptors / Indicators: Aching, Grimacing, Guarding Pain Intervention(s): Monitored during session    Home Living Family/patient expects to be discharged to:: Private residence Living Arrangements: Children (Daughter) Available Help at Discharge: Family;Available 24 hours/day Type of Home: Apartment Home Access: Level entry;Elevator       Home Layout: One level Home Equipment: Air cabin crew (4 wheels);Grab bars - tub/shower          PT Goals (current goals can now be found in the care plan section) Acute Rehab PT Goals Patient Stated Goal: Get well before going home PT Goal Formulation: With patient Time For Goal Achievement: 11/18/22 Potential to Achieve Goals: Good    Frequency    Min 5X/week      PT Plan Current plan remains appropriate       AM-PAC PT "6 Clicks" Mobility   Outcome Measure  Help needed turning from your back to your side while in a flat bed without using bedrails?: A Lot Help needed moving from lying on your back to sitting on the side of a flat bed without using bedrails?: A Little Help needed moving to and from a bed to a chair (including a wheelchair)?: A Little Help needed standing up from a chair using your arms (e.g., wheelchair or bedside chair)?: A Little Help needed to walk in hospital room?: A Lot Help needed climbing 3-5 steps with a railing? : A Lot 6 Click Score: 15    End of Session Equipment Utilized During Treatment: Gait belt;Oxygen Activity Tolerance: Patient tolerated treatment well Patient left: in bed;with call bell/phone within reach;with bed alarm  set Nurse Communication: Mobility status PT Visit Diagnosis: Unsteadiness on feet (R26.81);Other abnormalities of gait and mobility (R26.89);Muscle weakness (generalized) (M62.81);History of falling (Z91.81);Repeated falls (R29.6);Difficulty in walking, not elsewhere classified (R26.2);Pain Pain - Right/Left: Left Pain - part of body: Hip     Time: [id number] PT Time Calculation (min) (ACUTE ONLY): 23 min  Charges:  $Therapeutic Activity: 23-37 mins                     Tomma Rakers, DPT, CLT  Acute Rehabilitation Services Office: [phone] (Secure chat preferred)    Ander Purpura 11/05/2022, 3:33 PM

## 2022-11-06 DIAGNOSIS — E876 Hypokalemia: Secondary | ICD-10-CM | POA: Diagnosis not present

## 2022-11-06 DIAGNOSIS — S72142A Displaced intertrochanteric fracture of left femur, initial encounter for closed fracture: Secondary | ICD-10-CM | POA: Diagnosis not present

## 2022-11-06 NOTE — Progress Notes (Signed)
PATIENT ID: Tammie Martin  MRN: [id number]  DOB/AGE:  Mar 26, 1951 / 72 y.o.  3 Days Post-Op Procedure(s) (LRB): INTRAMEDULLARY (IM) NAIL INTERTROCHANTERIC (Left)    PROGRESS NOTE Subjective:   Patient is alert, oriented, no Nausea, no Vomiting, yes passing gas, yes Bowel Movement. Taking PO well. Denies SOB, Chest or Calf Pain. Using Incentive Spirometer, PAS in place. Ambulate WBAT with pt up with therapy, Patient reports pain as  0 at rest severe with therapy ,     Objective: Vital signs in last 24 hours: Temp:  [98.3 F (36.8 C)-98.8 F (37.1 C)] 98.4 F (36.9 C) (03/10 0742) Pulse Rate:  [72-96] 77 (03/10 0742) Resp:  [16-18] 16 (03/10 0742) BP: (101-143)/(74-86) 143/86 (03/10 0742) SpO2:  [90 %-93 %] 92 % (03/10 0742)    Intake/Output from previous day: I/O last 3 completed shifts: In: 120 [P.O.:120] Out: 1000 [Urine:1000]   Intake/Output this shift: No intake/output data recorded.   LABORATORY DATA: Recent Labs    11/04/22 0843 11/05/22 0428  WBC 13.5*  --   HGB 9.1*  --   HCT 27.6*  --   PLT 169  --   NA 134* 137  K 4.0 3.7  CL 100 102  CO2 27 30  BUN 21 24*  CREATININE 0.78 0.61  GLUCOSE 148* 100*  CALCIUM 7.8* 7.8*    Examination: Neurologically intact Neurovascular intact Sensation intact distally Intact pulses distally Dorsiflexion/Plantar flexion intact Incision: dressing C/D/I No cellulitis present Compartment soft}  Assessment:   3 Days Post-Op Procedure(s) (LRB): INTRAMEDULLARY (IM) NAIL INTERTROCHANTERIC (Left) ADDITIONAL DIAGNOSIS:  Hypertension, Cardiac Arrythmia Afib and aortic stenosis, and COPD   Plan:   Weight Bearing as Tolerated (WBAT)   DVT Prophylaxis:   Eliquis   DISCHARGE PLAN: Skilled Nursing Facility/Rehab   DISCHARGE NEEDS: HHPT, Walker, and 3-in-1 comode seat   -Pain control as needed.  Plan to continue hydrocodone outpatient.   Disposition per primary team.  Pending therapy evaluation.  She will likely benefit  from SNF placement.   Plan for outpatient follow-up 2 weeks from surgery at [facility] with Dr. Lucia Gaskins for reevaluation, radiographs of the left hip, and suture removal if appropriate.     Joanell Rising 11/06/2022, 9:47 AM

## 2022-11-06 NOTE — Progress Notes (Signed)
[facility] PROGRESS NOTE    Progress Note  Tammie Martin  [id number] DOB: 08/02/1951 DOA: 11/02/2022 PCP: Janora Norlander, DO     Brief Narrative:   Tammie Martin is an 72 y.o. female past medical history significant for essential hypertension, hypothyroidism seizure disorder history of CVA, paroxysmal atrial fibrillation on Eliquis status post TAVR is comes into the ED after a mechanical fall x-ray confirmed close displaced intertrochanteric femur fracture on the left   Assessment/Plan:   Closed displaced intertrochanteric fracture of left femur [facility]) Orthopedic surgery was consulted she is status post cephalomedullary nail of the left hip on 11/03/2022. On a regular diet, resume Eliquis. Continue narcotics for pain control. PT evaluated the patient, will need skilled nursing facility placement. TOC has been notified. Continue regimen has not had a bowel movement  Hypokalemia Replete orally, basic metabolic panels pending this morning.  A-fib (HCC) Rate controlled, continue beta-blockers. Continue Eliquis Optimize electrolytes.    Seizure disorder (HCC) Continue Keppra and Lamictal.  Hypothyroidism Continue Synthroid.  Dyslipidemia Continue Lovaza.  Essential hypertension, benign Continue beta-blockers.  Chronic obstructive pulmonary disease (COPD) ([place]) Setting 90% on room air continue inhalers stable.  Anxiety and depression Current regimen.   DVT prophylaxis: lovenox Family Communication:none Status is: Inpatient Remains inpatient appropriate because: Left hip fracture    Code Status:     Code Status Orders  (From admission, onward)           Start     Ordered   11/03/22 0225  Do not attempt resuscitation (DNR)  Continuous       Question Answer Comment  If patient has no pulse and is not breathing Do Not Attempt Resuscitation   If patient has a pulse and/or is breathing: Medical Treatment Goals MEDICAL INTERVENTIONS  DESIRED: Use advanced airway interventions, mechanical ventilation or cardioversion in appropriate circumstances; Use medication/IV fluids as indicated; Provide comfort medications; Transfer to Progressive/Stepdown/ICU as indicated.   Consent: Discussion documented in EHR or advanced directives reviewed      11/03/22 0224           Code Status History     Date Active Date Inactive Code Status Order ID Comments User Context   11/03/2022 0123 11/03/2022 0224 Full Code [id number]  Mansy, Arvella Merles, MD ED   02/07/2022 1010 02/11/2022 1933 Full Code [id number]  Germain Osgood, PA-C Inpatient   02/07/2022 0957 02/07/2022 1010 Full Code [id number]  Rolla Plate, DO Inpatient   02/04/2022 1757 02/06/2022 1808 Full Code [id number]  Albertine Patricia, MD ED   02/01/2022 1432 02/02/2022 2016 Full Code [id number]  Eileen Stanford, PA-C Inpatient   11/22/2021 0933 11/22/2021 1756 Full Code [id number]  Sherren Mocha, MD Inpatient         IV Access:   Peripheral IV   Procedures and diagnostic studies:   No results found.   Medical Consultants:   None.   Subjective:    Tammie Martin no complaint has had bowel movements.  Objective:    Vitals:   11/05/22 1509 11/05/22 2134 11/06/22 0508 11/06/22 0742  BP: 122/74 101/77 114/85 (!) 143/86  Pulse: 96 76 72 77  Resp: '18 17 17 16  '$ Temp: 98.8 F (37.1 C) 98.6 F (37 C) 98.3 F (36.8 C) 98.4 F (36.9 C)  TempSrc: Oral Oral Oral   SpO2: 93% 90% 90% 92%  Weight:      Height:       SpO2: 92 % O2 Flow Rate (  L/min): 2 L/min   Intake/Output Summary (Last 24 hours) at 11/06/2022 0759 Last data filed at 11/06/2022 0655 Gross per 24 hour  Intake 120 ml  Output 700 ml  Net -580 ml    Filed Weights   11/03/22 0315 11/03/22 1103  Weight: 54.4 kg 54.4 kg    Exam: General exam: In no acute distress. Respiratory system: Good air movement and clear to auscultation. Cardiovascular system: S1 & S2 heard, RRR. No JVD. Gastrointestinal  system: Abdomen is nondistended, soft and nontender.  Extremities: No pedal edema. Skin: No rashes, lesions or ulcers Psychiatry: Judgement and insight appear normal. Mood & affect appropriate. Data Reviewed:    Labs: Basic Metabolic Panel: Recent Labs  Lab 11/02/22 2338 11/03/22 0421 11/03/22 0804 11/04/22 0843 11/05/22 0428  NA 139 139 139 134* 137  K 3.0* 2.8* 3.0* 4.0 3.7  CL 98 98 101 100 102  CO2 33* 32 32 27 30  GLUCOSE 99 114* 109* 148* 100*  BUN '22 15 12 21 '$ 24*  CREATININE 0.71 0.63 0.57 0.78 0.61  CALCIUM 8.6* 8.3* 8.1* 7.8* 7.8*  MG 2.0  --   --   --   --     GFR Estimated Creatinine Clearance: 49.5 mL/min (by C-G formula based on SCr of 0.61 mg/dL). Liver Function Tests: Recent Labs  Lab 11/02/22 2338 11/04/22 0843  AST 24 27  ALT 18 16  ALKPHOS 80 63  BILITOT 0.3 0.5  PROT 6.6 5.3*  ALBUMIN 3.0* 2.3*    No results for input(s): "LIPASE", "AMYLASE" in the last 168 hours. No results for input(s): "AMMONIA" in the last 168 hours. Coagulation profile No results for input(s): "INR", "PROTIME" in the last 168 hours. COVID-19 Labs  No results for input(s): "DDIMER", "FERRITIN", "LDH", "CRP" in the last 72 hours.  Lab Results  Component Value Date   SARSCOV2NAA NEGATIVE 02/07/2022   [place] NEGATIVE 01/28/2022    CBC: Recent Labs  Lab 11/01/22 1528 11/02/22 2338 11/03/22 0421 11/04/22 0843  WBC 9.1 9.1 9.9 13.5*  NEUTROABS  --  5.5  --   --   HGB 13.8 11.7* 11.6* 9.1*  HCT 40.1 36.2 34.5* 27.6*  MCV 96 100.8* 97.5 99.6  PLT 254 218 201 169    Cardiac Enzymes: No results for input(s): "CKTOTAL", "CKMB", "CKMBINDEX", "TROPONINI" in the last 168 hours. BNP (last 3 results) No results for input(s): "PROBNP" in the last 8760 hours. CBG: No results for input(s): "GLUCAP" in the last 168 hours. D-Dimer: No results for input(s): "DDIMER" in the last 72 hours. Hgb A1c: No results for input(s): "HGBA1C" in the last 72 hours. Lipid  Profile: No results for input(s): "CHOL", "HDL", "LDLCALC", "TRIG", "CHOLHDL", "LDLDIRECT" in the last 72 hours. Thyroid function studies: No results for input(s): "TSH", "T4TOTAL", "T3FREE", "THYROIDAB" in the last 72 hours.  Invalid input(s): "FREET3"  Anemia work up: No results for input(s): "VITAMINB12", "FOLATE", "FERRITIN", "TIBC", "IRON", "RETICCTPCT" in the last 72 hours. Sepsis Labs: Recent Labs  Lab 11/01/22 1528 11/02/22 2338 11/03/22 0421 11/04/22 0843  WBC 9.1 9.1 9.9 13.5*    Microbiology Recent Results (from the past 240 hour(s))  MRSA Next Gen by PCR, Nasal     Status: Abnormal   Collection Time: 11/03/22 11:06 AM   Specimen: Nasal Mucosa; Nasal Swab  Result Value Ref Range Status   MRSA by PCR Next Gen DETECTED (A) NOT DETECTED Final    Comment: CRITICAL RESULT CALLED TO, READ BACK BY AND VERIFIED WITH: RN Wendie Chess  CLARKCURRY [id number] JRS (NOTE) The GeneXpert MRSA Assay (FDA approved for NASAL specimens only), is one component of a comprehensive MRSA colonization surveillance program. It is not intended to diagnose MRSA infection nor to guide or monitor treatment for MRSA infections. Test performance is not FDA approved in patients less than 75 years old. Performed at [facility], [place] [address], [place], [place] [address]      Medications:    apixaban  5 mg Oral BID   atorvastatin  40 mg Oral Daily   bisoprolol  20 mg Oral Daily   calcium carbonate  1 tablet Oral Q breakfast   cholecalciferol  2,000 Units Oral Daily   feeding supplement  237 mL Oral BID BM   lamoTRIgine  100 mg Oral BID   levETIRAcetam  750 mg Oral BID   levothyroxine  88 mcg Oral Q0600   loratadine  10 mg Oral Daily   magnesium oxide  400 mg Oral Daily   multivitamin  1 tablet Oral BID   multivitamin with minerals  1 tablet Oral Daily   omega-3 acid ethyl esters  1 capsule Oral BID   polyethylene glycol  17 g Oral BID   potassium chloride  10 mEq Oral Daily    potassium chloride SA  40 mEq Oral Once   venlafaxine XR  37.5 mg Oral Q breakfast   Continuous Infusions:  methocarbamol (ROBAXIN) IV        LOS: 3 days   Charlynne Cousins  [facility]  11/06/2022, 7:59 AM

## 2022-11-06 NOTE — Progress Notes (Addendum)
Mobility Specialist Progress Note    11/06/22 1444  Mobility  Activity Ambulated with assistance in room  Level of Assistance Moderate assist, patient does 50-74%  Assistive Device Front wheel walker  Distance Ambulated (ft) 6 ft  LLE Weight Bearing WBAT  Activity Response Tolerated fair  Mobility Referral Yes  $Mobility charge 1 Mobility   Pt received in bed requesting to attempt BM. Attempted to go to BR. Pt minA for bed mobility and to stand. Pt R knee buckled and required modA for recovery. Pulled BSC behind pt. Pt then attempted to walk back to chair but became unsteady and MS pulled chair up behind pt to sit. Left sitting up in chair with call bell in reach and chair alarm on.   Hildred Alamin Mobility Specialist  Please Contact via SecureChat or Rehab Office at [phone]

## 2022-11-06 NOTE — TOC Initial Note (Signed)
Transition of Care [facility]) - Initial/Assessment Note    Patient Details  Name: Tammie Martin MRN: [id number] Date of Birth: Jan 26, 1951  Transition of [facility]) CM/SW Contact:    Elliot Gurney [place], [place] Phone Number: 11/06/2022, 2:39 PM  Clinical Narrative:                 This Education officer, museum met with patient and patient's niece at bedside. Patient's sister by phone to discuss therapy recommendation for SNF. Per patient, she resides in a Senior apartment with her daughter-assistance is limited.  Patient has no additional services in the home at this time. Patient states that she has a cain at home and is followed by [place]. Patient uses [facility] in [place] delivery for her medications. [facility] faxed out for bed offers. Bed offers to be reviewed once received.  Keivon Garden, LCSW Transition of Care   Expected Discharge Plan: Skilled Nursing Facility Barriers to Discharge: Continued Medical Work up   Patient Goals and CMS Choice Patient states their goals for this hospitalization and ongoing recovery are:: "To get strong enough to go home" CMS Medicare.gov Compare Post Acute Care list provided to:: Patient Choice offered to / list presented to : Patient      Expected Discharge Plan and Services In-house Referral: Clinical Social Work   Post Acute Care Choice: [place] Living arrangements for the past 2 months: [place]                                      Prior Living Arrangements/Services Living arrangements for the past 2 months: Single Family Home Lives with:: Adult Children   Do you feel safe going back to the place where you live?: Yes      Need for Family Participation in Patient Care: No (Comment) Care giver support system in place?: Yes (comment) Current home services: DME (walker, shower chair) Criminal Activity/Legal Involvement Pertinent to Current Situation/Hospitalization: No  - Comment as needed  Activities of Daily Living Home Assistive Devices/Equipment: Environmental consultant (specify type), Shower chair without back, Eyeglasses, Dentures (specify type) ADL Screening (condition at time of admission) Patient's cognitive ability adequate to safely complete daily activities?: Yes Is the patient deaf or have difficulty hearing?: No Does the patient have difficulty seeing, even when wearing glasses/contacts?: Yes Does the patient have difficulty concentrating, remembering, or making decisions?: Yes Patient able to express need for assistance with ADLs?: Yes Does the patient have difficulty dressing or bathing?: No Independently performs ADLs?: Yes (appropriate for developmental age) Does the patient have difficulty walking or climbing stairs?: Yes (d/t injury at this time) Weakness of Legs: Left Weakness of Arms/Hands: None  Permission Sought/Granted   Permission granted to share information with : Yes, Verbal Permission Granted        Permission granted to share info w Relationship: Tyler Deis  Permission granted to share info w Contact Information: [phone]  Emotional Assessment Appearance:: Appears stated age Attitude/Demeanor/Rapport: Engaged Affect (typically observed): Accepting, Adaptable Orientation: : Oriented to Self, Oriented to Place, Oriented to  Time, Oriented to Situation Alcohol / Substance Use: Not Applicable Psych Involvement: No (comment)  Admission diagnosis:  Hypokalemia [E87.6] Closed left hip fracture (HCC) [S72.002A] Closed displaced intertrochanteric fracture of left femur, initial encounter [facility]) [S72.142A] Patient Active Problem List   Diagnosis Date Noted   Closed displaced intertrochanteric fracture of left femur ([place]) 11/03/2022  Hypokalemia 11/03/2022   Seizure disorder ([place]) 11/03/2022   Chronic obstructive pulmonary disease (COPD) ([employer]) 11/03/2022   Dyslipidemia 11/03/2022   Anxiety and depression 11/03/2022   Lymph node  enlargement [id number]   Acute metabolic encephalopathy [id number]   Acute respiratory failure with hypoxia ([place]) 02/07/2022   Depression 02/07/2022   A-fib ([place]) 02/07/2022   Seizure ([place]) 02/07/2022   Encephalopathy    CVA (cerebral vascular accident) ([place]) 02/04/2022   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   History of cervical cancer 02/01/2022   Severe aortic stenosis 01/17/2022   Tobacco abuse 01/17/2022   Osteopenia 03/11/2020   Olecranon bursitis of left elbow 11/26/2017   Essential hypertension, benign 11/20/2015   Hyperlipidemia LDL goal <130 11/20/2015   Hypothyroidism 11/20/2015   Generalized anxiety disorder 11/20/2015   PCP:  Janora Norlander, DO Pharmacy:   [facility] [place], [address] [place] [address] Phone: [phone] Fax: [place] [address] [place] [address] Phone: [phone] Fax: [place], [place] - [address] [place] [address] [place] [address] Phone: [phone] Fax: [phone]     Social Determinants of Health (SDOH) Social History: [place]: No Food Insecurity (11/03/2022)  Housing: Low Risk  (11/03/2022)  Transportation Needs: No Transportation Needs (11/03/2022)  Utilities: Not At Risk (11/03/2022)  Alcohol Screen: Low Risk  (08/17/2022)  Depression (PHQ2-9): Low Risk  (11/01/2022)  Recent Concern: Depression (PHQ2-9) - Medium Risk (08/17/2022)  Financial Resource Strain: Low Risk  (08/17/2022)  Physical Activity: Inactive (08/17/2022)  Social Connections: Moderately Isolated (08/17/2022)  Stress: No Stress Concern Present (08/17/2022)  Tobacco Use: High Risk (11/03/2022)   SDOH Interventions:     Readmission Risk Interventions    02/02/2022    1:58 PM  Readmission Risk Prevention Plan  Post Dischage Appt Complete  Medication Screening Complete   Transportation Screening Complete

## 2022-11-06 NOTE — NC FL2 (Signed)
[place] LEVEL OF CARE FORM     IDENTIFICATION  Patient Name: Tammie Martin Birthdate: 1951-03-07 Sex: female Admission Date (Current Location): 11/02/2022  [facility] and [place] Number:  Herbalist and Address:  [facility], [place] [address], [place], [place] [address]      Provider Number:    Attending Physician Name and Address:  Charlynne Cousins, MD  Relative Name and Phone Number:  Tyler Deis [phone]    Current Level of Care: Hospital Recommended Level of Care: [place] Prior Approval Number:    Date Approved/Denied:   PASRR Number: pending  Discharge Plan: SNF    Current Diagnoses: Patient Active Problem List   Diagnosis Date Noted   Closed displaced intertrochanteric fracture of left femur ([place]) 11/03/2022   Hypokalemia 11/03/2022   Seizure disorder ([place]) 11/03/2022   Chronic obstructive pulmonary disease (COPD) ([place]) 11/03/2022   Dyslipidemia 11/03/2022   Anxiety and depression 11/03/2022   Lymph node enlargement [id number]   Acute metabolic encephalopathy [id number]   Acute respiratory failure with hypoxia ([place]) 02/07/2022   Depression 02/07/2022   A-fib ([place]) 02/07/2022   Seizure ([place]) 02/07/2022   Encephalopathy    CVA (cerebral vascular accident) ([place]) 02/04/2022   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   History of cervical cancer 02/01/2022   Severe aortic stenosis 01/17/2022   Tobacco abuse 01/17/2022   Osteopenia 03/11/2020   Olecranon bursitis of left elbow 11/26/2017   Essential hypertension, benign 11/20/2015   Hyperlipidemia LDL goal <130 11/20/2015   Hypothyroidism 11/20/2015   Generalized anxiety disorder 11/20/2015    Orientation RESPIRATION BLADDER Height & Weight     Self, Time, Situation, Place  Normal Continent Weight: 119 lb 14.9 oz (54.4 kg) Height:  '5\' 2"'$  (157.5 cm)  BEHAVIORAL SYMPTOMS/MOOD NEUROLOGICAL BOWEL NUTRITION STATUS       Continent Diet  AMBULATORY STATUS COMMUNICATION OF NEEDS Skin   Limited Assist Verbally Normal                       Personal Care Assistance Level of Assistance  Bathing, Feeding, Dressing Bathing Assistance: Limited assistance Feeding assistance: Independent Dressing Assistance: Limited assistance     Functional Limitations Info  Sight, Hearing, Speech Sight Info: Adequate Hearing Info: Adequate Speech Info: Adequate    SPECIAL CARE FACTORS FREQUENCY  PT (By licensed PT), OT (By licensed OT)     PT Frequency: 5x per week OT Frequency: 5x per week            Contractures Contractures Info: Not present    Additional Factors Info  Code Status, Allergies Code Status Info: DNR Allergies Info: Penicillan           Current Medications (11/06/2022):  This is the current hospital active medication list Current Facility-Administered Medications  Medication Dose Route Frequency Provider Last Rate Last Admin   acetaminophen (TYLENOL) tablet 650 mg  650 mg Oral Q4H PRN Martinique, Jesse J, PA-C   650 mg at 11/06/22 0849   albuterol (PROVENTIL) (2.5 MG/3ML) 0.083% nebulizer solution 3 mL  3 mL Inhalation Q6H PRN Martinique, Jesse J, PA-C       apixaban (ELIQUIS) tablet 5 mg  5 mg Oral BID Pham, Minh Q, RPH-CPP   5 mg at 11/06/22 0850   atorvastatin (LIPITOR) tablet 40 mg  40 mg Oral Daily Martinique, Jesse J, PA-C   40 mg at 11/06/22 0849   bisoprolol (ZEBETA)  tablet 20 mg  20 mg Oral Daily Martinique, Jesse J, [place]   20 mg at 11/06/22 [id number]   calcium carbonate (OS-CAL - dosed in mg of elemental calcium) tablet 1,250 mg  1 tablet Oral Q breakfast Martinique, Jesse J, [place]   1,250 mg at 11/06/22 [id number]   cholecalciferol (VITAMIN D3) 25 MCG (1000 UNIT) tablet 2,000 Units  2,000 Units Oral Daily Martinique, Jesse J, PA-C   2,000 Units at 11/06/22 0849   feeding supplement (ENSURE ENLIVE / ENSURE PLUS) liquid 237 mL  237 mL Oral BID BM Charlynne Cousins, MD   237 mL at 11/06/22 0850    fluticasone (FLONASE) 50 MCG/ACT nasal spray 2 spray  2 spray Each Nare Daily PRN Martinique, Jesse J, PA-C       HYDROcodone-acetaminophen (NORCO/VICODIN) 5-325 MG per tablet 1-2 tablet  1-2 tablet Oral Q6H PRN Martinique, Jesse J, PA-C   2 tablet at 11/05/22 1853   hydrocortisone (ANUSOL-HC) suppository 25 mg  25 mg Rectal BID PRN Martinique, Jesse J, PA-C       hydrocortisone cream 1 % 1 Application  1 Application Topical BID PRN Martinique, Jesse J, PA-C       lamoTRIgine (LAMICTAL) tablet 100 mg  100 mg Oral BID Charlynne Cousins, MD   100 mg at 11/06/22 0848   levETIRAcetam (KEPPRA) tablet 750 mg  750 mg Oral BID Martinique, Jesse J, PA-C   750 mg at 11/06/22 [id number]   levothyroxine (SYNTHROID) tablet 88 mcg  88 mcg Oral Q0600 Martinique, Jesse J, [place]   88 mcg at 11/06/22 [id number]   loratadine (CLARITIN) tablet 10 mg  10 mg Oral Daily Martinique, Jesse J, [place]   10 mg at 11/06/22 0848   magnesium hydroxide (MILK OF MAGNESIA) suspension 30 mL  30 mL Oral Daily PRN Martinique, Jesse J, PA-C       magnesium oxide (MAG-OX) tablet 400 mg  400 mg Oral Daily Martinique, Jesse J, PA-C   400 mg at 11/06/22 0848   methocarbamol (ROBAXIN) tablet 500 mg  500 mg Oral Q6H PRN Martinique, Jesse J, PA-C   500 mg at 11/06/22 [id number]   Or   methocarbamol (ROBAXIN) 500 mg in dextrose 5 % 50 mL IVPB  500 mg Intravenous Q6H PRN Martinique, Jesse J, PA-C       morphine (PF) 2 MG/ML injection 0.5 mg  0.5 mg Intravenous Q2H PRN Martinique, Jesse J, PA-C   0.5 mg at 11/03/22 0800   multivitamin (PROSIGHT) tablet 1 tablet  1 tablet Oral BID Martinique, Jesse J, [place]   1 tablet at 11/06/22 [id number]   multivitamin with minerals tablet 1 tablet  1 tablet Oral Daily Martinique, Jesse J, [place]   1 tablet at 11/06/22 [id number]   omega-3 acid ethyl esters (LOVAZA) capsule 1 g  1 capsule Oral BID Martinique, Jesse J, PA-C   1 g at 11/06/22 0847   ondansetron (ZOFRAN) injection 4 mg  4 mg Intravenous Q4H PRN Martinique, Jesse J, PA-C       polyethylene glycol (MIRALAX / GLYCOLAX) packet 17 g  17 g Oral  BID PRN Martinique, Jesse J, PA-C       polyethylene glycol (MIRALAX / GLYCOLAX) packet 17 g  17 g Oral BID Charlynne Cousins, MD   17 g at 11/06/22 [id number]   polyvinyl alcohol (LIQUIFILM TEARS) 1.4 % ophthalmic solution   Both Eyes Daily PRN Martinique, Jesse J, PA-C       potassium chloride (KLOR-CON M) CR  tablet 10 mEq  10 mEq Oral Daily Martinique, Jesse J, [place]   10 mEq at 11/06/22 0848   potassium chloride SA (KLOR-CON M) CR tablet 40 mEq  40 mEq Oral Once Martinique, Jesse J, PA-C       traZODone (DESYREL) tablet 25 mg  25 mg Oral QHS PRN Martinique, Jesse J, PA-C       traZODone (DESYREL) tablet 50 mg  50 mg Oral QHS PRN Martinique, Jesse J, PA-C       venlafaxine XR (EFFEXOR-XR) 24 hr capsule 37.5 mg  37.5 mg Oral Q breakfast Martinique, Jesse J, PA-C   37.5 mg at 11/06/22 [id number]     Discharge Medications: Please see discharge summary for a list of discharge medications.  Relevant Imaging Results:  Relevant Lab Results:   Additional Information SS# [id number]  Elliot Gurney Colon, [place]

## 2022-11-07 ENCOUNTER — Encounter (HOSPITAL_COMMUNITY): Payer: Self-pay | Admitting: Orthopaedic Surgery

## 2022-11-07 DIAGNOSIS — S72142A Displaced intertrochanteric fracture of left femur, initial encounter for closed fracture: Secondary | ICD-10-CM | POA: Diagnosis not present

## 2022-11-07 DIAGNOSIS — E876 Hypokalemia: Secondary | ICD-10-CM | POA: Diagnosis not present

## 2022-11-07 MED ORDER — HYDROCODONE-ACETAMINOPHEN 5-325 MG PO TABS: 1.0000 | ORAL_TABLET | Freq: Four times a day (QID) | ORAL | 0 refills | Status: DC | PRN

## 2022-11-07 NOTE — Care Management Important Message (Signed)
Important Message  Patient Details  Name: Tammie Martin MRN: [id number] Date of Birth: 1951/04/26   Medicare Important Message Given:  Yes     Hannah Beat 11/07/2022, 11:56 AM

## 2022-11-07 NOTE — Progress Notes (Signed)
RE:  Tammie Martin      Date of Birth: Oct 20, 2050      Date:   11/07/22       To Whom It May Concern:  Please be advised that the above-named patient will require a short-term nursing home stay - anticipated 30 days or less for rehabilitation and strengthening.  The plan is for return home.                 MD signature                Date

## 2022-11-07 NOTE — Progress Notes (Signed)
Physical Therapy Treatment Patient Details Name: Tammie Martin MRN: [id number] DOB: 04-Jun-1951 Today's Date: 11/07/2022   History of Present Illness Pt is 72 yo female presenting to ED following fall. X-ray confirmed closed displaced intertrochanteric femur fracture on the L. Pt is currently s/p cephalomedullary nail of the L hip on 3/7. PMH: HTN, hypothyroidism, seizure disorder, hx VA, paroxysmal a-fib status post TAVR.    PT Comments    Slowly making progress towards functional goals. Transferred with min assist from recliner today. Mod assist to ambulate up to 10 feet with significant cues for sequencing, and technique with RW use. Limited WB tolerance on LLE and needs reminded frequently to utilize UEs on RW to unload as needed. Patient will continue to benefit from skilled physical therapy services to further improve independence with functional mobility.    Recommendations for follow up therapy are one component of a multi-disciplinary discharge planning process, led by the attending physician.  Recommendations may be updated based on patient status, additional functional criteria and insurance authorization.  Follow Up Recommendations  Skilled nursing-short term rehab (<3 hours/day) Can patient physically be transported by private vehicle: Yes   Assistance Recommended at Discharge Intermittent Supervision/Assistance  Patient can return home with the following Assistance with cooking/housework;Assist for transportation;Help with stairs or ramp for entrance;A lot of help with walking and/or transfers;A lot of help with bathing/dressing/bathroom   Equipment Recommendations  None recommended by PT    Recommendations for Other Services       Precautions / Restrictions Precautions Precautions: Fall Restrictions Weight Bearing Restrictions: Yes LLE Weight Bearing: Weight bearing as tolerated     Mobility  Bed Mobility               General bed mobility comments: In  Recliner    Transfers Overall transfer level: Needs assistance Equipment used: Rolling walker (2 wheels) Transfers: Sit to/from Stand Sit to Stand: Min assist           General transfer comment: Min assist for light boost to stand and VC to facilitate. Performed from recliner with appropriate hand placement.    Ambulation/Gait Ambulation/Gait assistance: Mod assist Gait Distance (Feet): 10 Feet Assistive device: Rolling walker (2 wheels) Gait Pattern/deviations: Step-to pattern, Decreased stride length, Antalgic, Decreased weight shift to left, Trunk flexed, Narrow base of support Gait velocity: decr Gait velocity interpretation: <1.31 ft/sec, indicative of household ambulator   General Gait Details: Educated on safe AD use with RW, forward steps and a narrow turn. Required max VC for sequencing and for upright stance with mod assist after the first few steps due to UE fatigue. Pt starts to flex at the trunk and looses UE torque increasing WB through LLE which is poorly tolerated at this time. Attempts to sit prematurely, needs assist with RW placement at all times.   Stairs             Wheelchair Mobility    Modified Rankin (Stroke Patients Only)       Balance Overall balance assessment: Needs assistance, History of Falls Sitting-balance support: Feet supported, No upper extremity supported Sitting balance-Leahy Scale: Fair     Standing balance support: Single extremity supported Standing balance-Leahy Scale: Poor Standing balance comment: Stands with single UE support minimal WB through LLE.                            Cognition Arousal/Alertness: Awake/alert Behavior During Therapy: WFL for tasks assessed/performed Overall Cognitive  Status: History of cognitive impairments - at baseline                                 General Comments: Oriented x4 however easily looses track of her thoughts.        Exercises      General  Comments        Pertinent Vitals/Pain Pain Assessment Pain Assessment: Faces Faces Pain Scale: Hurts even more Pain Location: L hip (when walking) Pain Descriptors / Indicators: Aching, Grimacing, Guarding Pain Intervention(s): Limited activity within patient's tolerance, Monitored during session, Repositioned, Ice applied    Home Living                          Prior Function            PT Goals (current goals can now be found in the care plan section) Acute Rehab PT Goals Patient Stated Goal: Get well before going home PT Goal Formulation: With patient Time For Goal Achievement: 11/18/22 Potential to Achieve Goals: Good Progress towards PT goals: Progressing toward goals    Frequency    Min 5X/week      PT Plan Current plan remains appropriate    Co-evaluation              AM-PAC PT "6 Clicks" Mobility   Outcome Measure  Help needed turning from your back to your side while in a flat bed without using bedrails?: A Lot Help needed moving from lying on your back to sitting on the side of a flat bed without using bedrails?: A Little Help needed moving to and from a bed to a chair (including a wheelchair)?: A Little Help needed standing up from a chair using your arms (e.g., wheelchair or bedside chair)?: A Little Help needed to walk in hospital room?: A Lot Help needed climbing 3-5 steps with a railing? : Total 6 Click Score: 14    End of Session Equipment Utilized During Treatment: Gait belt Activity Tolerance: Patient tolerated treatment well Patient left: with call bell/phone within reach;in chair;with chair alarm set;with SCD's reapplied Nurse Communication: Mobility status PT Visit Diagnosis: Unsteadiness on feet (R26.81);Other abnormalities of gait and mobility (R26.89);Muscle weakness (generalized) (M62.81);History of falling (Z91.81);Repeated falls (R29.6);Difficulty in walking, not elsewhere classified (R26.2);Pain Pain - Right/Left:  Left Pain - part of body: Hip     Time: [id number] PT Time Calculation (min) (ACUTE ONLY): 19 min  Charges:  $Gait Training: 8-22 mins                     Candie Mile, PT, DPT Physical Therapist Acute Rehabilitation Services [facility] Outpatient Rehabilitation Services [facility]    Ellouise Newer 11/07/2022, 10:47 AM

## 2022-11-07 NOTE — TOC Progression Note (Addendum)
Transition of Care [facility]) - Progression Note    Patient Details  Name: Tammie Martin MRN: [id number] Date of Birth: [birth year]  Transition of Care [facility]) CM/SW Contact  Joanne Chars, LCSW Phone Number: 11/07/2022, 10:23 AM  Clinical Narrative:   CSW spoke with pt sister Tammie Martin regarding bed offers.  She is requesting response from [facility] center and [place] reached out to those SNFs.    1330: [facility] does not offer bed, [facility].  CSW spoke with pt sister Tammie Martin, she does want to accept offer at [facility]. CSW spoke with [employer], she will start insurance auth, could take pt tomorrow if approved.   PASSR received: [id number]    Expected Discharge Plan: [place] Barriers to Discharge: Continued Medical Work up  Expected Discharge Plan and Services In-house Referral: Clinical Social Work   Post Acute Care Choice: [place] Living arrangements for the past 2 months: [place] Expected Discharge Date: 11/07/22                                     Social Determinants of Health (SDOH) Interventions SDOH Screenings   Food Insecurity: No Food Insecurity (11/03/2022)  Housing: Low Risk  (11/03/2022)  Transportation Needs: No Transportation Needs (11/03/2022)  Utilities: Not At Risk (11/03/2022)  Alcohol Screen: Low Risk  (08/17/2022)  Depression (PHQ2-9): Low Risk  (11/01/2022)  Recent Concern: Depression (PHQ2-9) - Medium Risk (08/17/2022)  Financial Resource Strain: Low Risk  (08/17/2022)  Physical Activity: Inactive (08/17/2022)  Social Connections: Moderately Isolated (08/17/2022)  Stress: No Stress Concern Present (08/17/2022)  Tobacco Use: High Risk (11/03/2022)    Readmission Risk Interventions    02/02/2022    1:58 PM  Readmission Risk Prevention Plan  Post Dischage Appt Complete  Medication Screening Complete  Transportation Screening Complete

## 2022-11-07 NOTE — Progress Notes (Signed)
     Tammie Martin is a 72 y.o. female   Orthopaedic diagnosis: Left intertrochanteric hip fracture Surgery: Cephalomedullary nail of left hip fracture 11-03-22  Subjective: Patient sleeping during rounds this morning, waking up a bit more confused than usual.  Oriented to self and place.  She voices pain is well-controlled at rest but does have pain with movements of the hip.  She has had a bowel movement.  Denies shortness of breath or chest pain.  No new concerns.  Pending SNF placement for disposition.  Objectyive: Vitals:   11/06/22 1944 11/07/22 0738  BP: 108/70 (!) 119/102  Pulse: 74 99  Resp: 16   Temp: 97.8 F (36.6 C) 97.6 F (36.4 C)  SpO2: 93% 99%     Exam: Awake and alert Respirations even and unlabored No acute distress  Left hip shows intact surgical dressings with ABDs removed today.  Mild appropriate bruising and aging ecchymosis about the hip.  She has pain with attempted range of motion at the hip.  He is able to gently flex and extend the knee.  5 out of 5 strength at the ankle.  Neurovascular intact distally.  Calf soft nontender with SCDs in place.  Exposed skin is benign.   Assessment: Post op day 4 status post the above, doing well and suitable for discharge from orthopedic standpoint once SNF placement obtained.   Plan: -Limit narcotics as able, may be contributing to confusion today but does not seem she has been taking much per nursing.  Outpatient hydrocodone printed and signed for SNF. -Weightbearing as tolerated left lower extremity with walker -Keep dressing clean, dry, intact -Continue therapies while in house -Baseline Eliquis for DVT prophylaxis  Plan for outpatient follow-up 2 weeks from surgery at [facility] with Dr. Lucia Gaskins for reevaluation, radiographs of the left hip, and suture removal if appropriate.  Orthopedics will sign off while inpatient and plan to revisit outpatient.  Loralei Radcliffe J. Martinique, PA-C

## 2022-11-07 NOTE — TOC Progression Note (Signed)
Transition of Care [facility]) - Progression Note    Patient Details  Name: Tammie Martin MRN: [id number] Date of Birth: 20-Nov-1950  Transition of Care [facility]) CM/SW Contact  Joanne Chars, [place] Phone Number: 11/07/2022, 10:11 AM  Clinical Narrative:  PASSR additional info uploaded to [facility].      Expected Discharge Plan: [place] Barriers to Discharge: Continued Medical Work up  Expected Discharge Plan and Services In-house Referral: Clinical Social Work   Post Acute Care Choice: [place] Living arrangements for the past 2 months: [place] Expected Discharge Date: 11/07/22                                     Social Determinants of Health (SDOH) Interventions SDOH Screenings   Food Insecurity: No Food Insecurity (11/03/2022)  Housing: Low Risk  (11/03/2022)  Transportation Needs: No Transportation Needs (11/03/2022)  Utilities: Not At Risk (11/03/2022)  Alcohol Screen: Low Risk  (08/17/2022)  Depression (PHQ2-9): Low Risk  (11/01/2022)  Recent Concern: Depression (PHQ2-9) - Medium Risk (08/17/2022)  Financial Resource Strain: Low Risk  (08/17/2022)  Physical Activity: Inactive (08/17/2022)  Social Connections: Moderately Isolated (08/17/2022)  Stress: No Stress Concern Present (08/17/2022)  Tobacco Use: High Risk (11/03/2022)    Readmission Risk Interventions    02/02/2022    1:58 PM  Readmission Risk Prevention Plan  Post Dischage Appt Complete  Medication Screening Complete  Transportation Screening Complete

## 2022-11-07 NOTE — Discharge Summary (Signed)
Physician Discharge Summary  Tammie Martin [id number] DOB: 19-Mar-1951 DOA: 11/02/2022  PCP: Janora Norlander, DO  Admit date: 11/02/2022 Discharge date: 11/07/2022  Admitted From: ALF Disposition:  SNF  Recommendations for Outpatient Follow-up:  Follow up with PCP in 1-2 weeks Please obtain BMP/CBC in one week   Home Health:No Equipment/Devices:None  Discharge Condition:Stable CODE STATUS:Full Diet recommendation: Heart Healthy  Brief/Interim Summary: 72 y.o. female past medical history significant for essential hypertension, hypothyroidism seizure disorder history of CVA, paroxysmal atrial fibrillation on Eliquis status post TAVR is comes into the ED after a mechanical fall x-ray confirmed close displaced intertrochanteric femur fracture on the left   Discharge Diagnoses:  Principal Problem:   Closed displaced intertrochanteric fracture of left femur ([place]) Active Problems:   Hypokalemia   A-fib (HCC)   Seizure disorder ([place])   Hypothyroidism   Dyslipidemia   Essential hypertension, benign   Tobacco abuse   Chronic obstructive pulmonary disease (COPD) ([place])   Anxiety and depression  Close displaced intertrochanteric left femur fracture: Status post surgical intervention on 11/03/2022. Her pain is controlled. Physical therapy evaluated the patient recommended rehab short-term. Continue MiraLAX as needed as an outpatient.  Hypokalemia: Repleted orally now resolved.  Chronic atrial fibrillation: Rate controlled beta-blockers Eliquis held on admission electrolytes were optimized she will resume her Eliquis as an outpatient.  Seizure disorder: Continue Keppra and Lamictal.  Hypothyroidism: Continue Synthroid.  Dyslipidemia: Continue Lovaza.  Essential hypertension: Continue beta-blockers.  COPD: Send greater 90% on room air no changes made to her medication.  Anxiety and depression: Changes made to her medication.  Discharge Instructions  Discharge  Instructions     Diet - low sodium heart healthy   Complete by: As directed    Increase activity slowly   Complete by: As directed       Allergies as of 11/07/2022       Reactions   Calcium Channel Blockers    Penicillins Hives   Singulair [montelukast Sodium] Other (See Comments)   headache   Chantix [varenicline] Nausea Only   GI intolerance with higher doses   Codeine Rash   Nicoderm [nicotine] Hives, Swelling   Other Rash   Band-aids cause rash after a few days        Medication List     TAKE these medications    albuterol 108 (90 Base) MCG/ACT inhaler Commonly known as: VENTOLIN HFA Inhale 2 puffs into the lungs every 6 (six) hours as needed for wheezing or shortness of breath.   ALIVE WOMENS 50+ PO Take 1 tablet by mouth daily.   PRESERVISION AREDS PO Take 1 capsule by mouth 2 (two) times daily.   atorvastatin 40 MG tablet Commonly known as: LIPITOR TAKE ONE TABLET BY MOUTH DAILY   azithromycin 500 MG tablet Commonly known as: Zithromax Take 1 tablet (500 mg total) by mouth as directed. TAKE 500 MG  1 HOUR PRIOR TO ANY DENTAL  CLEANINGS AND PROCEDURES.   bisoprolol 10 MG tablet Commonly known as: ZEBETA Take 1 and 1/2 tablets (15 mg total) by mouth daily. What changed: See the new instructions.   CALCIUM-MAGNESIUM PO Take 1 tablet by mouth daily.   cetirizine 10 MG tablet Commonly known as: ZYRTEC Take 10 mg by mouth daily.   desvenlafaxine 50 MG 24 hr tablet Commonly known as: PRISTIQ Take 1 tablet (50 mg total) by mouth daily. (NEEDS TO BE SEEN BEFORE NEXT REFILL)   Eliquis 5 MG Tabs tablet Generic drug: apixaban TAKE ONE TABLET  BY MOUTH TWICE DAILY What changed: how much to take   fluticasone 50 MCG/ACT nasal spray Commonly known as: FLONASE Place 2 sprays into both nostrils daily as needed for allergies or rhinitis.   furosemide 20 MG tablet Commonly known as: LASIX TAKE ONE TABLET BY MOUTH DAILY What changed: how much to take    HYDROcodone-acetaminophen 5-325 MG tablet Commonly known as: NORCO/VICODIN Take 1 tablet by mouth every 6 (six) hours as needed.   hydrocortisone 25 MG suppository Commonly known as: ANUSOL-HC Place 1 suppository (25 mg total) rectally 2 (two) times daily as needed for hemorrhoids or anal itching.   hydrocortisone cream 1 % Apply 1 application. topically 2 (two) times daily as needed (rash).   lamoTRIgine 25 MG tablet Commonly known as: LAMICTAL Start with Lamotrigine '25mg'$  (one pill) at night for one week, then '25mg'$  (one pill) twice daily for one week, then '25mg'$  (one pill) morning and '50mg'$  (two pills) night for one week, then '50mg'$  (two pills) twice daily for one week then '75mg'$  (three pills) twice daily for one week then finally '100mg'$  (fours pills) twice daily What changed:  how much to take how to take this when to take this   levETIRAcetam 750 MG tablet Commonly known as: KEPPRA Take 1 tablet (750 mg total) by mouth 2 (two) times daily.   levothyroxine 88 MCG tablet Commonly known as: SYNTHROID TAKE ONE TABLET BY MOUTH DAILY   Omega-3-6-9 Caps Take 1 capsule by mouth 2 (two) times daily.   polyethylene glycol powder 17 GM/SCOOP powder Commonly known as: GLYCOLAX/MIRALAX Mix 1 capful in 8 ounces of water and drink up to twice daily as needed for constipation   potassium chloride 10 MEQ tablet Commonly known as: KLOR-CON M Take 1 tablet (10 mEq total) by mouth daily.   PROBIOTIC ADVANCED PO Take 1 capsule by mouth daily.   SYSTANE OP Place 1 drop into both eyes daily as needed (dry eyes).   traZODone 50 MG tablet Commonly known as: DESYREL Take 1 tablet (50 mg total) by mouth at bedtime as needed for sleep.   TURMERIC PO Take 1 capsule by mouth 2 (two) times daily.   Vitamin D 50 MCG (2000 UT) tablet Take 2,000 Units by mouth daily.        Follow-up Information     Erle Crocker, MD Follow up in 2 week(s).   Specialty: Orthopedic Surgery Contact  information: [place] [address] [phone]                Allergies  Allergen Reactions   Calcium Channel Blockers    Penicillins Hives   Singulair [Montelukast Sodium] Other (See Comments)    headache   Chantix [Varenicline] Nausea Only    GI intolerance with higher doses   Codeine Rash   Nicoderm [Nicotine] Hives and Swelling   Other Rash    Band-aids cause rash after a few days    Consultations: Orthopedic surgery   Procedures/Studies: DG FEMUR MIN 2 VIEWS LEFT  Result Date: 11/03/2022 CLINICAL DATA:  Intramedullary rod fixation of left femur. EXAM: LEFT FEMUR 2 VIEWS; DG C-ARM 1-60 MIN-NO REPORT Radiation exposure index: 8.72 mGy. COMPARISON:  November 02, 2022. FINDINGS: Six intraoperative fluoroscopic images were obtained of the left hip. These images demonstrate surgical internal fixation proximal left femoral intertrochanteric fracture. IMPRESSION: Fluoroscopic guidance provided during surgical internal fixation of proximal left femoral intertrochanteric fracture. Electronically Signed   By: Marijo Conception M.D.   On:  11/03/2022 13:08   DG C-Arm 1-60 Min-No Report  Result Date: 11/03/2022 Fluoroscopy was utilized by the requesting physician.  No radiographic interpretation.   DG Shoulder Left  Result Date: 11/03/2022 CLINICAL DATA:  Recent fall with left shoulder pain, initial encounter EXAM: LEFT SHOULDER - 2 VIEW COMPARISON:  None Available. FINDINGS: Mild degenerative changes of the acromioclavicular joint are seen. No acute fracture or dislocation is noted. No soft tissue abnormality is seen. IMPRESSION: No acute abnormality noted. Electronically Signed   By: Inez Catalina M.D.   On: 11/03/2022 00:15   DG Hips Bilat W or Wo Pelvis 5 Views  Result Date: 11/03/2022 CLINICAL DATA:  Left hip pain following fall, initial encounter EXAM: DG HIP (WITH OR WITHOUT PELVIS) 5+V BILAT COMPARISON:  None Available. FINDINGS: Pelvic ring is intact. Mildly  displaced left intratrochanteric fracture is seen. The proximal right femur appears within normal limits. No soft tissue abnormality is noted. IMPRESSION: Left intratrochanteric fracture Electronically Signed   By: Inez Catalina M.D.   On: 11/03/2022 00:15   CT Head Wo Contrast  Result Date: 11/02/2022 CLINICAL DATA:  Head trauma, minor (Age >= 65y).  Fall EXAM: CT HEAD WITHOUT CONTRAST TECHNIQUE: Contiguous axial images were obtained from the base of the skull through the vertex without intravenous contrast. RADIATION DOSE REDUCTION: This exam was performed according to the departmental dose-optimization program which includes automated exposure control, adjustment of the mA and/or kV according to patient size and/or use of iterative reconstruction technique. COMPARISON:  None Available. FINDINGS: Brain: There is atrophy and chronic small vessel disease changes. No acute intracranial abnormality. Specifically, no hemorrhage, hydrocephalus, mass lesion, acute infarction, or significant intracranial injury. Vascular: No hyperdense vessel or unexpected calcification. Skull: No acute calvarial abnormality. Sinuses/Orbits: No acute findings Other: None IMPRESSION: Atrophy, chronic microvascular disease. No acute intracranial abnormality. Electronically Signed   By: Rolm Baptise M.D.   On: 11/02/2022 23:59   (Echo, Carotid, EGD, Colonoscopy, ERCP)    Subjective: No complaints  Discharge Exam: Vitals:   11/06/22 1944 11/07/22 0738  BP: 108/70 (!) 119/102  Pulse: 74 99  Resp: 16   Temp: 97.8 F (36.6 C) 97.6 F (36.4 C)  SpO2: 93% 99%   Vitals:   11/06/22 0722 11/06/22 0742 11/06/22 1944 11/07/22 0738  BP:  (!) 143/86 108/70 (!) 119/102  Pulse:  77 74 99  Resp:  16 16   Temp:  98.4 F (36.9 C) 97.8 F (36.6 C) 97.6 F (36.4 C)  TempSrc:    Oral  SpO2: 93% 92% 93% 99%  Weight:      Height:        General: Pt is alert, awake, not in acute distress Cardiovascular: RRR, S1/S2 +, no rubs, no  gallops Respiratory: CTA bilaterally, no wheezing, no rhonchi Abdominal: Soft, NT, ND, bowel sounds + Extremities: no edema, no cyanosis    The results of significant diagnostics from this hospitalization (including imaging, microbiology, ancillary and laboratory) are listed below for reference.     Microbiology: Recent Results (from the past 240 hour(s))  MRSA Next Gen by PCR, Nasal     Status: Abnormal   Collection Time: 11/03/22 11:06 AM   Specimen: Nasal Mucosa; Nasal Swab  Result Value Ref Range Status   MRSA by PCR Next Gen DETECTED (A) NOT DETECTED Final    Comment: CRITICAL RESULT CALLED TO, READ BACK BY AND VERIFIED WITH: RN Felix Pacini [id number] JRS (NOTE) The GeneXpert MRSA Assay (FDA approved for NASAL specimens  only), is one component of a comprehensive MRSA colonization surveillance program. It is not intended to diagnose MRSA infection nor to guide or monitor treatment for MRSA infections. Test performance is not FDA approved in patients less than 48 years old. Performed at [facility], [place] [address] [place], [place] [address]      Labs: BNP (last 3 results) No results for input(s): "BNP" in the last 8760 hours. Basic Metabolic Panel: Recent Labs  Lab 11/02/22 2338 11/03/22 0421 11/03/22 0804 11/04/22 0843 11/05/22 0428  NA 139 139 139 134* 137  K 3.0* 2.8* 3.0* 4.0 3.7  CL 98 98 101 100 102  CO2 33* 32 32 27 30  GLUCOSE 99 114* 109* 148* 100*  BUN '22 15 12 21 '$ 24*  CREATININE 0.71 0.63 0.57 0.78 0.61  CALCIUM 8.6* 8.3* 8.1* 7.8* 7.8*  MG 2.0  --   --   --   --    Liver Function Tests: Recent Labs  Lab 11/02/22 2338 11/04/22 0843  AST 24 27  ALT 18 16  ALKPHOS 80 63  BILITOT 0.3 0.5  PROT 6.6 5.3*  ALBUMIN 3.0* 2.3*   No results for input(s): "LIPASE", "AMYLASE" in the last 168 hours. No results for input(s): "AMMONIA" in the last 168 hours. CBC: Recent Labs  Lab 11/01/22 1528 11/02/22 2338 11/03/22 0421  11/04/22 0843  WBC 9.1 9.1 9.9 13.5*  NEUTROABS  --  5.5  --   --   HGB 13.8 11.7* 11.6* 9.1*  HCT 40.1 36.2 34.5* 27.6*  MCV 96 100.8* 97.5 99.6  PLT 254 218 201 169   Cardiac Enzymes: No results for input(s): "CKTOTAL", "CKMB", "CKMBINDEX", "TROPONINI" in the last 168 hours. BNP: Invalid input(s): "POCBNP" CBG: No results for input(s): "GLUCAP" in the last 168 hours. D-Dimer No results for input(s): "DDIMER" in the last 72 hours. Hgb A1c No results for input(s): "HGBA1C" in the last 72 hours. Lipid Profile No results for input(s): "CHOL", "HDL", "LDLCALC", "TRIG", "CHOLHDL", "LDLDIRECT" in the last 72 hours. Thyroid function studies No results for input(s): "TSH", "T4TOTAL", "T3FREE", "THYROIDAB" in the last 72 hours.  Invalid input(s): "FREET3" Anemia work up No results for input(s): "VITAMINB12", "FOLATE", "FERRITIN", "TIBC", "IRON", "RETICCTPCT" in the last 72 hours. Urinalysis    Component Value Date/Time   COLORURINE YELLOW 02/07/2022 1219   APPEARANCEUR HAZY (A) 02/07/2022 1219   LABSPEC >1.046 (H) 02/07/2022 1219   PHURINE 6.0 02/07/2022 1219   GLUCOSEU NEGATIVE 02/07/2022 1219   HGBUR NEGATIVE 02/07/2022 1219   BILIRUBINUR NEGATIVE 02/07/2022 1219   KETONESUR 5 (A) 02/07/2022 1219   PROTEINUR 30 (A) 02/07/2022 1219   NITRITE NEGATIVE 02/07/2022 1219   LEUKOCYTESUR TRACE (A) 02/07/2022 1219   Sepsis Labs Recent Labs  Lab 11/01/22 1528 11/02/22 2338 11/03/22 0421 11/04/22 0843  WBC 9.1 9.1 9.9 13.5*   Microbiology Recent Results (from the past 240 hour(s))  MRSA Next Gen by PCR, Nasal     Status: Abnormal   Collection Time: 11/03/22 11:06 AM   Specimen: Nasal Mucosa; Nasal Swab  Result Value Ref Range Status   MRSA by PCR Next Gen DETECTED (A) NOT DETECTED Final    Comment: CRITICAL RESULT CALLED TO, READ BACK BY AND VERIFIED WITH: RN Felix Pacini [id number] JRS (NOTE) The GeneXpert MRSA Assay (FDA approved for NASAL specimens only), is one  component of a comprehensive MRSA colonization surveillance program. It is not intended to diagnose MRSA infection nor to guide or monitor treatment for  MRSA infections. Test performance is not FDA approved in patients less than 42 years old. Performed at [facility], [place] [address], [place], [place] [address]     SIGNED:   Charlynne Cousins, MD  [facility] 11/07/2022, 9:28 AM Pager   If 7PM-7AM, please contact night-coverage [email or web address] Password [email or web address]

## 2022-11-08 ENCOUNTER — Ambulatory Visit: Payer: Medicare HMO | Admitting: Cardiology

## 2022-11-08 DIAGNOSIS — I4891 Unspecified atrial fibrillation: Secondary | ICD-10-CM | POA: Diagnosis not present

## 2022-11-08 DIAGNOSIS — I10 Essential (primary) hypertension: Secondary | ICD-10-CM | POA: Diagnosis not present

## 2022-11-08 DIAGNOSIS — E038 Other specified hypothyroidism: Secondary | ICD-10-CM | POA: Diagnosis not present

## 2022-11-08 DIAGNOSIS — Z9181 History of falling: Secondary | ICD-10-CM | POA: Diagnosis not present

## 2022-11-08 DIAGNOSIS — I480 Paroxysmal atrial fibrillation: Secondary | ICD-10-CM | POA: Diagnosis not present

## 2022-11-08 DIAGNOSIS — M25552 Pain in left hip: Secondary | ICD-10-CM | POA: Diagnosis not present

## 2022-11-08 DIAGNOSIS — E785 Hyperlipidemia, unspecified: Secondary | ICD-10-CM | POA: Diagnosis not present

## 2022-11-08 DIAGNOSIS — E876 Hypokalemia: Secondary | ICD-10-CM | POA: Diagnosis not present

## 2022-11-08 DIAGNOSIS — M6281 Muscle weakness (generalized): Secondary | ICD-10-CM | POA: Diagnosis not present

## 2022-11-08 DIAGNOSIS — S72142D Displaced intertrochanteric fracture of left femur, subsequent encounter for closed fracture with routine healing: Secondary | ICD-10-CM | POA: Diagnosis not present

## 2022-11-08 DIAGNOSIS — S72142A Displaced intertrochanteric fracture of left femur, initial encounter for closed fracture: Secondary | ICD-10-CM | POA: Diagnosis not present

## 2022-11-08 DIAGNOSIS — R2689 Other abnormalities of gait and mobility: Secondary | ICD-10-CM | POA: Diagnosis not present

## 2022-11-08 DIAGNOSIS — R531 Weakness: Secondary | ICD-10-CM | POA: Diagnosis not present

## 2022-11-08 DIAGNOSIS — J449 Chronic obstructive pulmonary disease, unspecified: Secondary | ICD-10-CM | POA: Diagnosis not present

## 2022-11-08 DIAGNOSIS — G40909 Epilepsy, unspecified, not intractable, without status epilepticus: Secondary | ICD-10-CM | POA: Diagnosis not present

## 2022-11-08 DIAGNOSIS — R41841 Cognitive communication deficit: Secondary | ICD-10-CM | POA: Diagnosis not present

## 2022-11-08 NOTE — Progress Notes (Signed)
Called receiving facility to give report. Nurse was able to give report to receiving facility.

## 2022-11-08 NOTE — Plan of Care (Signed)

## 2022-11-08 NOTE — Progress Notes (Signed)
[facility] PROGRESS NOTE    Progress Note  Tammie Martin  [id number] DOB: 1951/06/20 DOA: 11/02/2022 PCP: Janora Norlander, DO     Brief Narrative:   Tammie Martin is an 72 y.o. female past medical history significant for essential hypertension, hypothyroidism seizure disorder history of CVA, paroxysmal atrial fibrillation on Eliquis status post TAVR is comes into the ED after a mechanical fall x-ray confirmed close displaced intertrochanteric femur fracture on the left   Assessment/Plan:   Closed displaced intertrochanteric fracture of left femur ([place]) Awaiting insurance authorization. Continue narcotics for pain control. PT evaluated the patient, will need skilled nursing facility placement. Continue regimen has not had a bowel movement  Hypokalemia Replete orally, basic metabolic panels pending this morning.  A-fib (HCC) Rate controlled, continue beta-blockers. Continue Eliquis Optimize electrolytes.    Seizure disorder (HCC) Continue Keppra and Lamictal.  Hypothyroidism Continue Synthroid.  Dyslipidemia Continue Lovaza.  Essential hypertension, benign Continue beta-blockers.  Chronic obstructive pulmonary disease (COPD) ([place]) Setting 90% on room air continue inhalers stable.  Anxiety and depression Current regimen.   DVT prophylaxis: lovenox Family Communication:none Status is: Inpatient Remains inpatient appropriate because: Left hip fracture    Code Status:     Code Status Orders  (From admission, onward)           Start     Ordered   11/03/22 0225  Do not attempt resuscitation (DNR)  Continuous       Question Answer Comment  If patient has no pulse and is not breathing Do Not Attempt Resuscitation   If patient has a pulse and/or is breathing: Medical Treatment Goals MEDICAL INTERVENTIONS DESIRED: Use advanced airway interventions, mechanical ventilation or cardioversion in appropriate circumstances; Use medication/IV  fluids as indicated; Provide comfort medications; Transfer to Progressive/Stepdown/ICU as indicated.   Consent: Discussion documented in EHR or advanced directives reviewed      11/03/22 0224           Code Status History     Date Active Date Inactive Code Status Order ID Comments User Context   11/03/2022 0123 11/03/2022 0224 Full Code [id number]  Mansy, Arvella Merles, MD ED   02/07/2022 1010 02/11/2022 1933 Full Code [id number]  Germain Osgood, PA-C Inpatient   02/07/2022 0957 02/07/2022 1010 Full Code [id number]  Rolla Plate, DO Inpatient   02/04/2022 1757 02/06/2022 1808 Full Code [id number]  Albertine Patricia, MD ED   02/01/2022 1432 02/02/2022 2016 Full Code [id number]  Eileen Stanford, PA-C Inpatient   11/22/2021 0933 11/22/2021 1756 Full Code [id number]  Sherren Mocha, MD Inpatient         IV Access:   Peripheral IV   Procedures and diagnostic studies:   No results found.   Medical Consultants:   None.   Subjective:    Tammie Martin was confused this morning apologetic about the way she spoke to the nurse  Objective:    Vitals:   11/07/22 1506 11/07/22 2037 11/08/22 0436 11/08/22 0754  BP: 129/79 (!) 139/97 (!) 140/92 (!) 120/91  Pulse: 61 88 87 73  Resp:  17 18   Temp: 97.6 F (36.4 C) 98.1 F (36.7 C)  97.8 F (36.6 C)  TempSrc: Oral   Oral  SpO2: 90% 92% (!) 89% 93%  Weight:      Height:       SpO2: 93 % O2 Flow Rate (L/min): 3 L/min  No intake or output data in the 24 hours ending 11/08/22 0840  Filed Weights   11/03/22 0315 11/03/22 1103  Weight: 54.4 kg 54.4 kg    Exam: General exam: In no acute distress. Respiratory system: Good air movement and clear to auscultation. Cardiovascular system: S1 & S2 heard, RRR. No JVD. Gastrointestinal system: Abdomen is nondistended, soft and nontender.  Extremities: No pedal edema. Skin: No rashes, lesions or ulcers Psychiatry: Judgement and insight appear normal. Mood & affect appropriate. Data  Reviewed:    Labs: Basic Metabolic Panel: Recent Labs  Lab 11/02/22 2338 11/03/22 0421 11/03/22 0804 11/04/22 0843 11/05/22 0428  NA 139 139 139 134* 137  K 3.0* 2.8* 3.0* 4.0 3.7  CL 98 98 101 100 102  CO2 33* 32 32 27 30  GLUCOSE 99 114* 109* 148* 100*  BUN '22 15 12 21 '$ 24*  CREATININE 0.71 0.63 0.57 0.78 0.61  CALCIUM 8.6* 8.3* 8.1* 7.8* 7.8*  MG 2.0  --   --   --   --     GFR Estimated Creatinine Clearance: 49.5 mL/min (by C-G formula based on SCr of 0.61 mg/dL). Liver Function Tests: Recent Labs  Lab 11/02/22 2338 11/04/22 0843  AST 24 27  ALT 18 16  ALKPHOS 80 63  BILITOT 0.3 0.5  PROT 6.6 5.3*  ALBUMIN 3.0* 2.3*    No results for input(s): "LIPASE", "AMYLASE" in the last 168 hours. No results for input(s): "AMMONIA" in the last 168 hours. Coagulation profile No results for input(s): "INR", "PROTIME" in the last 168 hours. COVID-19 Labs  No results for input(s): "DDIMER", "FERRITIN", "LDH", "CRP" in the last 72 hours.  Lab Results  Component Value Date   SARSCOV2NAA NEGATIVE 02/07/2022   [place] NEGATIVE 01/28/2022    CBC: Recent Labs  Lab 11/01/22 1528 11/02/22 2338 11/03/22 0421 11/04/22 0843  WBC 9.1 9.1 9.9 13.5*  NEUTROABS  --  5.5  --   --   HGB 13.8 11.7* 11.6* 9.1*  HCT 40.1 36.2 34.5* 27.6*  MCV 96 100.8* 97.5 99.6  PLT 254 218 201 169    Cardiac Enzymes: No results for input(s): "CKTOTAL", "CKMB", "CKMBINDEX", "TROPONINI" in the last 168 hours. BNP (last 3 results) No results for input(s): "PROBNP" in the last 8760 hours. CBG: No results for input(s): "GLUCAP" in the last 168 hours. D-Dimer: No results for input(s): "DDIMER" in the last 72 hours. Hgb A1c: No results for input(s): "HGBA1C" in the last 72 hours. Lipid Profile: No results for input(s): "CHOL", "HDL", "LDLCALC", "TRIG", "CHOLHDL", "LDLDIRECT" in the last 72 hours. Thyroid function studies: No results for input(s): "TSH", "T4TOTAL", "T3FREE", "THYROIDAB" in  the last 72 hours.  Invalid input(s): "FREET3"  Anemia work up: No results for input(s): "VITAMINB12", "FOLATE", "FERRITIN", "TIBC", "IRON", "RETICCTPCT" in the last 72 hours. Sepsis Labs: Recent Labs  Lab 11/01/22 1528 11/02/22 2338 11/03/22 0421 11/04/22 0843  WBC 9.1 9.1 9.9 13.5*    Microbiology Recent Results (from the past 240 hour(s))  MRSA Next Gen by PCR, Nasal     Status: Abnormal   Collection Time: 11/03/22 11:06 AM   Specimen: Nasal Mucosa; Nasal Swab  Result Value Ref Range Status   MRSA by PCR Next Gen DETECTED (A) NOT DETECTED Final    Comment: CRITICAL RESULT CALLED TO, READ BACK BY AND VERIFIED WITH: RN Felix Pacini [id number] JRS (NOTE) The GeneXpert MRSA Assay (FDA approved for NASAL specimens only), is one component of a comprehensive MRSA colonization surveillance program. It is not intended to diagnose MRSA infection nor to guide or monitor  treatment for MRSA infections. Test performance is not FDA approved in patients less than 87 years old. Performed at [facility], [place] [address], [place], [place] [address]      Medications:    apixaban  5 mg Oral BID   atorvastatin  40 mg Oral Daily   bisoprolol  20 mg Oral Daily   calcium carbonate  1 tablet Oral Q breakfast   cholecalciferol  2,000 Units Oral Daily   feeding supplement  237 mL Oral BID BM   lamoTRIgine  100 mg Oral BID   levETIRAcetam  750 mg Oral BID   levothyroxine  88 mcg Oral Q0600   loratadine  10 mg Oral Daily   magnesium oxide  400 mg Oral Daily   multivitamin  1 tablet Oral BID   multivitamin with minerals  1 tablet Oral Daily   omega-3 acid ethyl esters  1 capsule Oral BID   potassium chloride  10 mEq Oral Daily   potassium chloride SA  40 mEq Oral Once   venlafaxine XR  37.5 mg Oral Q breakfast   Continuous Infusions:  methocarbamol (ROBAXIN) IV        LOS: 5 days   Charlynne Cousins  [facility]  11/08/2022, 8:40 AM

## 2022-11-08 NOTE — TOC Progression Note (Addendum)
Transition of Care [facility]) - Progression Note    Patient Details  Name: Tammie Martin MRN: [id number] Date of Birth: August 01, 1951  [facility] of [facility] CM/SW Contact  Joanne Chars, LCSW Phone Number: 11/08/2022, 10:36 AM  Clinical Narrative:   CSW message from [facility].  Auth still pending.  She will call once it is approved.   1310: Message from [place].  Auth approved.  MD informed.   CSW spoke with sister Stanton Kidney regarding providing transportation.  She is able to do this.  [place], [place] be to [place] by 3pm.  RN informed.   Expected Discharge Plan: [place] Barriers to Discharge: Continued Medical Work up  Expected Discharge Plan and Services In-house Referral: Clinical Social Work   Post Acute Care Choice: [place] Living arrangements for the past 2 months: [place] Expected Discharge Date: 11/07/22                                     Social Determinants of Health (SDOH) Interventions SDOH Screenings   Food Insecurity: No Food Insecurity (11/03/2022)  Housing: Low Risk  (11/03/2022)  Transportation Needs: No Transportation Needs (11/03/2022)  Utilities: Not At Risk (11/03/2022)  Alcohol Screen: Low Risk  (08/17/2022)  Depression (PHQ2-9): Low Risk  (11/01/2022)  Recent Concern: Depression (PHQ2-9) - Medium Risk (08/17/2022)  Financial Resource Strain: Low Risk  (08/17/2022)  Physical Activity: Inactive (08/17/2022)  Social Connections: Moderately Isolated (08/17/2022)  Stress: No Stress Concern Present (08/17/2022)  Tobacco Use: High Risk (11/07/2022)    Readmission Risk Interventions    02/02/2022    1:58 PM  Readmission Risk Prevention Plan  Post Dischage Appt Complete  Medication Screening Complete  Transportation Screening Complete

## 2022-11-08 NOTE — Discharge Summary (Signed)
Physician Discharge Summary  Tammie Martin [id number] DOB: 1951-03-10 DOA: 11/02/2022  PCP: Janora Norlander, DO  Admit date: 11/02/2022 Discharge date: 11/08/2022  Admitted From: ALF Disposition:  SNF  Recommendations for Outpatient Follow-up:  Follow up with PCP in 1-2 weeks Please obtain BMP/CBC in one week   Home Health:No Equipment/Devices:None  Discharge Condition:Stable CODE STATUS:Full Diet recommendation: Heart Healthy  Brief/Interim Summary: 72 y.o. female past medical history significant for essential hypertension, hypothyroidism seizure disorder history of CVA, paroxysmal atrial fibrillation on Eliquis status post TAVR is comes into the ED after a mechanical fall x-ray confirmed close displaced intertrochanteric femur fracture on the left   Discharge Diagnoses:  Principal Problem:   Closed displaced intertrochanteric fracture of left femur ([place]) Active Problems:   Hypokalemia   A-fib (HCC)   Seizure disorder ([place])   Hypothyroidism   Dyslipidemia   Essential hypertension, benign   Tobacco abuse   Chronic obstructive pulmonary disease (COPD) ([place])   Anxiety and depression  Close displaced intertrochanteric left femur fracture: Status post surgical intervention on 11/03/2022. Her pain is controlled. Physical therapy evaluated the patient recommended rehab short-term. Continue MiraLAX as needed as an outpatient.  Hypokalemia: Repleted orally now resolved.  Chronic atrial fibrillation: Rate controlled beta-blockers Eliquis held on admission electrolytes were optimized she will resume her Eliquis as an outpatient.  Seizure disorder: Continue Keppra and Lamictal.  Hypothyroidism: Continue Synthroid.  Dyslipidemia: Continue Lovaza.  Essential hypertension: Continue beta-blockers.  COPD: Send greater 90% on room air no changes made to her medication.  Anxiety and depression: Changes made to her medication.  Discharge Instructions  Discharge  Instructions     Diet - low sodium heart healthy   Complete by: As directed    Diet - low sodium heart healthy   Complete by: As directed    Increase activity slowly   Complete by: As directed    Increase activity slowly   Complete by: As directed       Allergies as of 11/08/2022       Reactions   Calcium Channel Blockers    Penicillins Hives   Singulair [montelukast Sodium] Other (See Comments)   headache   Chantix [varenicline] Nausea Only   GI intolerance with higher doses   Codeine Rash   Nicoderm [nicotine] Hives, Swelling   Other Rash   Band-aids cause rash after a few days        Medication List     TAKE these medications    albuterol 108 (90 Base) MCG/ACT inhaler Commonly known as: VENTOLIN HFA Inhale 2 puffs into the lungs every 6 (six) hours as needed for wheezing or shortness of breath.   ALIVE WOMENS 50+ PO Take 1 tablet by mouth daily.   PRESERVISION AREDS PO Take 1 capsule by mouth 2 (two) times daily.   atorvastatin 40 MG tablet Commonly known as: LIPITOR TAKE ONE TABLET BY MOUTH DAILY   azithromycin 500 MG tablet Commonly known as: Zithromax Take 1 tablet (500 mg total) by mouth as directed. TAKE 500 MG  1 HOUR PRIOR TO ANY DENTAL  CLEANINGS AND PROCEDURES.   bisoprolol 10 MG tablet Commonly known as: ZEBETA Take 1 and 1/2 tablets (15 mg total) by mouth daily. What changed: See the new instructions.   CALCIUM-MAGNESIUM PO Take 1 tablet by mouth daily.   cetirizine 10 MG tablet Commonly known as: ZYRTEC Take 10 mg by mouth daily.   desvenlafaxine 50 MG 24 hr tablet Commonly known as: PRISTIQ Take 1  tablet (50 mg total) by mouth daily. (NEEDS TO BE SEEN BEFORE NEXT REFILL)   Eliquis 5 MG Tabs tablet Generic drug: apixaban TAKE ONE TABLET BY MOUTH TWICE DAILY What changed: how much to take   fluticasone 50 MCG/ACT nasal spray Commonly known as: FLONASE Place 2 sprays into both nostrils daily as needed for allergies or rhinitis.    furosemide 20 MG tablet Commonly known as: LASIX TAKE ONE TABLET BY MOUTH DAILY What changed: how much to take   HYDROcodone-acetaminophen 5-325 MG tablet Commonly known as: NORCO/VICODIN Take 1 tablet by mouth every 6 (six) hours as needed.   hydrocortisone 25 MG suppository Commonly known as: ANUSOL-HC Place 1 suppository (25 mg total) rectally 2 (two) times daily as needed for hemorrhoids or anal itching.   hydrocortisone cream 1 % Apply 1 application. topically 2 (two) times daily as needed (rash).   lamoTRIgine 25 MG tablet Commonly known as: LAMICTAL Start with Lamotrigine '25mg'$  (one pill) at night for one week, then '25mg'$  (one pill) twice daily for one week, then '25mg'$  (one pill) morning and '50mg'$  (two pills) night for one week, then '50mg'$  (two pills) twice daily for one week then '75mg'$  (three pills) twice daily for one week then finally '100mg'$  (fours pills) twice daily What changed:  how much to take how to take this when to take this   levETIRAcetam 750 MG tablet Commonly known as: KEPPRA Take 1 tablet (750 mg total) by mouth 2 (two) times daily.   levothyroxine 88 MCG tablet Commonly known as: SYNTHROID TAKE ONE TABLET BY MOUTH DAILY   Omega-3-6-9 Caps Take 1 capsule by mouth 2 (two) times daily.   polyethylene glycol powder 17 GM/SCOOP powder Commonly known as: GLYCOLAX/MIRALAX Mix 1 capful in 8 ounces of water and drink up to twice daily as needed for constipation   potassium chloride 10 MEQ tablet Commonly known as: KLOR-CON M Take 1 tablet (10 mEq total) by mouth daily.   PROBIOTIC ADVANCED PO Take 1 capsule by mouth daily.   SYSTANE OP Place 1 drop into both eyes daily as needed (dry eyes).   traZODone 50 MG tablet Commonly known as: DESYREL Take 1 tablet (50 mg total) by mouth at bedtime as needed for sleep.   TURMERIC PO Take 1 capsule by mouth 2 (two) times daily.   Vitamin D 50 MCG (2000 UT) tablet Take 2,000 Units by mouth daily.         Follow-up Information     Erle Crocker, MD Follow up in 2 week(s).   Specialty: Orthopedic Surgery Contact information: [place] [address] [phone]                Allergies  Allergen Reactions   Calcium Channel Blockers    Penicillins Hives   Singulair [Montelukast Sodium] Other (See Comments)    headache   Chantix [Varenicline] Nausea Only    GI intolerance with higher doses   Codeine Rash   Nicoderm [Nicotine] Hives and Swelling   Other Rash    Band-aids cause rash after a few days    Consultations: Orthopedic surgery   Procedures/Studies: DG FEMUR MIN 2 VIEWS LEFT  Result Date: 11/03/2022 CLINICAL DATA:  Intramedullary rod fixation of left femur. EXAM: LEFT FEMUR 2 VIEWS; DG C-ARM 1-60 MIN-NO REPORT Radiation exposure index: 8.72 mGy. COMPARISON:  November 02, 2022. FINDINGS: Six intraoperative fluoroscopic images were obtained of the left hip. These images demonstrate surgical internal fixation proximal left femoral intertrochanteric fracture.  IMPRESSION: Fluoroscopic guidance provided during surgical internal fixation of proximal left femoral intertrochanteric fracture. Electronically Signed   By: Marijo Conception M.D.   On: 11/03/2022 13:08   DG C-Arm 1-60 Min-No Report  Result Date: 11/03/2022 Fluoroscopy was utilized by the requesting physician.  No radiographic interpretation.   DG Shoulder Left  Result Date: 11/03/2022 CLINICAL DATA:  Recent fall with left shoulder pain, initial encounter EXAM: LEFT SHOULDER - 2 VIEW COMPARISON:  None Available. FINDINGS: Mild degenerative changes of the acromioclavicular joint are seen. No acute fracture or dislocation is noted. No soft tissue abnormality is seen. IMPRESSION: No acute abnormality noted. Electronically Signed   By: Inez Catalina M.D.   On: 11/03/2022 00:15   DG Hips Bilat W or Wo Pelvis 5 Views  Result Date: 11/03/2022 CLINICAL DATA:  Left hip pain following fall, initial encounter EXAM:  DG HIP (WITH OR WITHOUT PELVIS) 5+V BILAT COMPARISON:  None Available. FINDINGS: Pelvic ring is intact. Mildly displaced left intratrochanteric fracture is seen. The proximal right femur appears within normal limits. No soft tissue abnormality is noted. IMPRESSION: Left intratrochanteric fracture Electronically Signed   By: Inez Catalina M.D.   On: 11/03/2022 00:15   CT Head Wo Contrast  Result Date: 11/02/2022 CLINICAL DATA:  Head trauma, minor (Age >= 65y).  Fall EXAM: CT HEAD WITHOUT CONTRAST TECHNIQUE: Contiguous axial images were obtained from the base of the skull through the vertex without intravenous contrast. RADIATION DOSE REDUCTION: This exam was performed according to the departmental dose-optimization program which includes automated exposure control, adjustment of the mA and/or kV according to patient size and/or use of iterative reconstruction technique. COMPARISON:  None Available. FINDINGS: Brain: There is atrophy and chronic small vessel disease changes. No acute intracranial abnormality. Specifically, no hemorrhage, hydrocephalus, mass lesion, acute infarction, or significant intracranial injury. Vascular: No hyperdense vessel or unexpected calcification. Skull: No acute calvarial abnormality. Sinuses/Orbits: No acute findings Other: None IMPRESSION: Atrophy, chronic microvascular disease. No acute intracranial abnormality. Electronically Signed   By: Rolm Baptise M.D.   On: 11/02/2022 23:59   (Echo, Carotid, EGD, Colonoscopy, ERCP)    Subjective: No complaints  Discharge Exam: Vitals:   11/08/22 0754 11/08/22 1304  BP: (!) 120/91 131/86  Pulse: 73 90  Resp:  20  Temp: 97.8 F (36.6 C) 98 F (36.7 C)  SpO2: 93% 92%   Vitals:   11/07/22 2037 11/08/22 0436 11/08/22 0754 11/08/22 1304  BP: (!) 139/97 (!) 140/92 (!) 120/91 131/86  Pulse: 88 87 73 90  Resp: '17 18  20  '$ Temp: 98.1 F (36.7 C)  97.8 F (36.6 C) 98 F (36.7 C)  TempSrc:   Oral Oral  SpO2: 92% (!) 89% 93% 92%   Weight:      Height:        General: Pt is alert, awake, not in acute distress Cardiovascular: RRR, S1/S2 +, no rubs, no gallops Respiratory: CTA bilaterally, no wheezing, no rhonchi Abdominal: Soft, NT, ND, bowel sounds + Extremities: no edema, no cyanosis    The results of significant diagnostics from this hospitalization (including imaging, microbiology, ancillary and laboratory) are listed below for reference.     Microbiology: Recent Results (from the past 240 hour(s))  MRSA Next Gen by PCR, Nasal     Status: Abnormal   Collection Time: 11/03/22 11:06 AM   Specimen: Nasal Mucosa; Nasal Swab  Result Value Ref Range Status   MRSA by PCR Next Gen DETECTED (A) NOT DETECTED Final  Comment: CRITICAL RESULT CALLED TO, READ BACK BY AND VERIFIED WITH: RN Felix Pacini [id number] JRS (NOTE) The GeneXpert MRSA Assay (FDA approved for NASAL specimens only), is one component of a comprehensive MRSA colonization surveillance program. It is not intended to diagnose MRSA infection nor to guide or monitor treatment for MRSA infections. Test performance is not FDA approved in patients less than 83 years old. Performed at [facility], [place] [address], [place], [place] [address]      Labs: BNP (last 3 results) No results for input(s): "BNP" in the last 8760 hours. Basic Metabolic Panel: Recent Labs  Lab 11/02/22 2338 11/03/22 0421 11/03/22 0804 11/04/22 0843 11/05/22 0428  NA 139 139 139 134* 137  K 3.0* 2.8* 3.0* 4.0 3.7  CL 98 98 101 100 102  CO2 33* 32 32 27 30  GLUCOSE 99 114* 109* 148* 100*  BUN '22 15 12 21 '$ 24*  CREATININE 0.71 0.63 0.57 0.78 0.61  CALCIUM 8.6* 8.3* 8.1* 7.8* 7.8*  MG 2.0  --   --   --   --    Liver Function Tests: Recent Labs  Lab 11/02/22 2338 11/04/22 0843  AST 24 27  ALT 18 16  ALKPHOS 80 63  BILITOT 0.3 0.5  PROT 6.6 5.3*  ALBUMIN 3.0* 2.3*   No results for input(s): "LIPASE", "AMYLASE" in the last 168 hours. No results  for input(s): "AMMONIA" in the last 168 hours. CBC: Recent Labs  Lab 11/01/22 1528 11/02/22 2338 11/03/22 0421 11/04/22 0843  WBC 9.1 9.1 9.9 13.5*  NEUTROABS  --  5.5  --   --   HGB 13.8 11.7* 11.6* 9.1*  HCT 40.1 36.2 34.5* 27.6*  MCV 96 100.8* 97.5 99.6  PLT 254 218 201 169   Cardiac Enzymes: No results for input(s): "CKTOTAL", "CKMB", "CKMBINDEX", "TROPONINI" in the last 168 hours. BNP: Invalid input(s): "POCBNP" CBG: No results for input(s): "GLUCAP" in the last 168 hours. D-Dimer No results for input(s): "DDIMER" in the last 72 hours. Hgb A1c No results for input(s): "HGBA1C" in the last 72 hours. Lipid Profile No results for input(s): "CHOL", "HDL", "LDLCALC", "TRIG", "CHOLHDL", "LDLDIRECT" in the last 72 hours. Thyroid function studies No results for input(s): "TSH", "T4TOTAL", "T3FREE", "THYROIDAB" in the last 72 hours.  Invalid input(s): "FREET3" Anemia work up No results for input(s): "VITAMINB12", "FOLATE", "FERRITIN", "TIBC", "IRON", "RETICCTPCT" in the last 72 hours. Urinalysis    Component Value Date/Time   COLORURINE YELLOW 02/07/2022 1219   APPEARANCEUR HAZY (A) 02/07/2022 1219   LABSPEC >1.046 (H) 02/07/2022 1219   PHURINE 6.0 02/07/2022 1219   GLUCOSEU NEGATIVE 02/07/2022 1219   HGBUR NEGATIVE 02/07/2022 1219   BILIRUBINUR NEGATIVE 02/07/2022 1219   KETONESUR 5 (A) 02/07/2022 1219   PROTEINUR 30 (A) 02/07/2022 1219   NITRITE NEGATIVE 02/07/2022 1219   LEUKOCYTESUR TRACE (A) 02/07/2022 1219   Sepsis Labs Recent Labs  Lab 11/01/22 1528 11/02/22 2338 11/03/22 0421 11/04/22 0843  WBC 9.1 9.1 9.9 13.5*   Microbiology Recent Results (from the past 240 hour(s))  MRSA Next Gen by PCR, Nasal     Status: Abnormal   Collection Time: 11/03/22 11:06 AM   Specimen: Nasal Mucosa; Nasal Swab  Result Value Ref Range Status   MRSA by PCR Next Gen DETECTED (A) NOT DETECTED Final    Comment: CRITICAL RESULT CALLED TO, READ BACK BY AND VERIFIED WITH: RN  Felix Pacini [id number] JRS (NOTE) The GeneXpert MRSA Assay (FDA approved for NASAL  specimens only), is one component of a comprehensive MRSA colonization surveillance program. It is not intended to diagnose MRSA infection nor to guide or monitor treatment for MRSA infections. Test performance is not FDA approved in patients less than 28 years old. Performed at [facility], [place] [address], [place], [place] [address]     SIGNED:   Charlynne Cousins, MD  [facility] 11/08/2022, 1:16 PM Pager   If 7PM-7AM, please contact night-coverage [email or web address] Password [email or web address]

## 2022-11-08 NOTE — Progress Notes (Signed)
Physical Therapy Treatment Patient Details Name: Tammie Martin MRN: [id number] DOB: 1950/12/29 Today's Date: 11/08/2022   History of Present Illness Pt is 72 yo female presenting to ED following fall. X-ray confirmed closed displaced intertrochanteric femur fracture on the L. Pt is currently s/p cephalomedullary nail of the L hip on 3/7. PMH: HTN, hypothyroidism, seizure disorder, hx VA, paroxysmal a-fib status post TAVR.    PT Comments    Notable progress today. Increased ambulatory distance to 15 feet with min assist, showing emergence of step-through gait pattern intermittently. Still requires max VC for sequencing and assist to progress RW with each step. Min assist with transfer, cues for set-up and alignment prior to rising. Reviewed LE exercises. Ice applied to Lt hip, upright in recliner eating breakfast, comfortable at this time. Patient will continue to benefit from skilled physical therapy services to further improve independence with functional mobility.    Recommendations for follow up therapy are one component of a multi-disciplinary discharge planning process, led by the attending physician.  Recommendations may be updated based on patient status, additional functional criteria and insurance authorization.  Follow Up Recommendations  Skilled nursing-short term rehab (<3 hours/day) Can patient physically be transported by private vehicle: Yes   Assistance Recommended at Discharge Intermittent Supervision/Assistance  Patient can return home with the following Assistance with cooking/housework;Assist for transportation;Help with stairs or ramp for entrance;A lot of help with walking and/or transfers;A lot of help with bathing/dressing/bathroom   Equipment Recommendations  None recommended by PT    Recommendations for Other Services       Precautions / Restrictions Precautions Precautions: Fall Restrictions Weight Bearing Restrictions: Yes LLE Weight Bearing: Weight  bearing as tolerated     Mobility  Bed Mobility Overal bed mobility: Needs Assistance Bed Mobility: Supine to Sit     Supine to sit: Min assist     General bed mobility comments: Min assist briefly for LLE suppor to EOB. Pt able to scoot self to EOB after LE dangles. Cues throughout.    Transfers Overall transfer level: Needs assistance Equipment used: Rolling walker (2 wheels) Transfers: Sit to/from Stand Sit to Stand: Min assist           General transfer comment: Min assist for boost to stand, cues for hand placement and technique.    Ambulation/Gait Ambulation/Gait assistance: Min assist Gait Distance (Feet): 15 Feet Assistive device: Rolling walker (2 wheels) Gait Pattern/deviations: Step-to pattern, Decreased stride length, Antalgic, Decreased weight shift to left, Trunk flexed, Narrow base of support, Step-through pattern Gait velocity: decr Gait velocity interpretation: <1.31 ft/sec, indicative of household ambulator   General Gait Details: Starting to demonstrate step-through patterning. Educated further on RW use, unloading LLE as needed. Still requires max cues for sequencing. Min assist for balance and RW progression. Improved tolerance and distance noted today.   Stairs             Wheelchair Mobility    Modified Rankin (Stroke Patients Only)       Balance Overall balance assessment: Needs assistance, History of Falls Sitting-balance support: Feet supported, No upper extremity supported Sitting balance-Leahy Scale: Fair     Standing balance support: Single extremity supported Standing balance-Leahy Scale: Poor Standing balance comment: Stands with single UE support minimal WB through LLE.                            Cognition Arousal/Alertness: Awake/alert Behavior During Therapy: WFL for tasks assessed/performed Overall  Cognitive Status: History of cognitive impairments - at baseline                                  General Comments: Oriented x4 however easily looses track of her thoughts. Forgetful        Exercises General Exercises - Lower Extremity Ankle Circles/Pumps: AROM, Both, 15 reps, Seated Quad Sets: Strengthening, Both, 10 reps, Seated Gluteal Sets: Strengthening, Both, 15 reps, Seated    General Comments General comments (skin integrity, edema, etc.): Ice applied Lt hip. eating in recliner.      Pertinent Vitals/Pain Pain Assessment Pain Assessment: Faces Faces Pain Scale: Hurts even more Pain Location: L hip (when walking) Pain Descriptors / Indicators: Aching, Grimacing, Guarding Pain Intervention(s): Monitored during session, Repositioned, Ice applied    Home Living                          Prior Function            PT Goals (current goals can now be found in the care plan section) Acute Rehab PT Goals Patient Stated Goal: Get well before going home PT Goal Formulation: With patient Time For Goal Achievement: 11/18/22 Potential to Achieve Goals: Good Progress towards PT goals: Progressing toward goals    Frequency    Min 5X/week      PT Plan Current plan remains appropriate    Co-evaluation              AM-PAC PT "6 Clicks" Mobility   Outcome Measure  Help needed turning from your back to your side while in a flat bed without using bedrails?: A Little Help needed moving from lying on your back to sitting on the side of a flat bed without using bedrails?: A Little Help needed moving to and from a bed to a chair (including a wheelchair)?: A Little Help needed standing up from a chair using your arms (e.g., wheelchair or bedside chair)?: A Little Help needed to walk in hospital room?: A Little Help needed climbing 3-5 steps with a railing? : Total 6 Click Score: 16    End of Session Equipment Utilized During Treatment: Gait belt Activity Tolerance: Patient tolerated treatment well Patient left: with call bell/phone within reach;in  chair;with chair alarm set;with SCD's reapplied   PT Visit Diagnosis: Unsteadiness on feet (R26.81);Other abnormalities of gait and mobility (R26.89);Muscle weakness (generalized) (M62.81);History of falling (Z91.81);Repeated falls (R29.6);Difficulty in walking, not elsewhere classified (R26.2);Pain Pain - Right/Left: Left Pain - part of body: Hip     Time: [id number] PT Time Calculation (min) (ACUTE ONLY): 20 min  Charges:  $Gait Training: 8-22 mins                     Candie Mile, PT, DPT Physical Therapist Acute Rehabilitation Services [facility] Outpatient Rehabilitation Services [facility]    Ellouise Newer 11/08/2022, 10:44 AM

## 2022-11-08 NOTE — TOC Transition Note (Signed)
Transition of Care [facility]) - CM/SW Discharge Note   Patient Details  Name: Tammie Martin MRN: [id number] Date of Birth: 05/28/1951  [facility] CM/SW Contact:  Joanne Chars, LCSW Phone Number: 11/08/2022, 1:30 PM   Clinical Narrative:   Pt discharging to [facility].  RN call [phone] for report.  Sister Stanton Kidney will transport, coming from [facility], said she will be at [facility] entrance around 3pm.  Will need pt brought down to her and assistance in getting pt into the vehicle.    Final next level of care: Skilled Nursing Facility Barriers to Discharge: Barriers Resolved   Patient Goals and CMS Choice CMS Medicare.gov Compare Post Acute Care list provided to:: Patient Choice offered to / list presented to : Patient  Discharge Placement                Patient chooses bed at:  [facility]) Patient to be transferred to facility by: sister Stanton Kidney Name of family member notified: sister Stanton Kidney Patient and family notified of of transfer: 11/08/22  Discharge Plan and Services Additional resources added to the After Visit Summary for   In-house Referral: Clinical Social Work   Post Acute Care Choice: [place]                               Social Determinants of Health ([place]) Interventions SDOH Screenings   Food Insecurity: No Food Insecurity (11/03/2022)  Housing: Low Risk  (11/03/2022)  Transportation Needs: No Transportation Needs (11/03/2022)  Utilities: Not At Risk (11/03/2022)  Alcohol Screen: Low Risk  (08/17/2022)  Depression (PHQ2-9): Low Risk  (11/01/2022)  Recent Concern: Depression (PHQ2-9) - Medium Risk (08/17/2022)  Financial Resource Strain: Low Risk  (08/17/2022)  Physical Activity: Inactive (08/17/2022)  Social Connections: Moderately Isolated (08/17/2022)  Stress: No Stress Concern Present (08/17/2022)  Tobacco Use: High Risk (11/07/2022)     Readmission Risk Interventions    02/02/2022    1:58 PM   Readmission Risk Prevention Plan  Post Dischage Appt Complete  Medication Screening Complete  Transportation Screening Complete

## 2022-11-16 ENCOUNTER — Other Ambulatory Visit: Payer: Self-pay | Admitting: *Deleted

## 2022-11-16 NOTE — Patient Outreach (Signed)
Per [facility] Mrs. Pizzimenti resides in [facility]. Screening for potential [place] care coordination needs as benefit of health plan and Primary Care Provider.   Collaboration with Melanie, [place] social worker. Plan is to return home with daughter.   Will continue to follow for potential [place] care coordination services.   Marthenia Rolling, MSN, RN,BSN [facility] [facility] Acute Care Coordinator [phone] (Direct dial)

## 2022-11-18 ENCOUNTER — Ambulatory Visit: Payer: Medicare HMO | Admitting: Family Medicine

## 2022-11-18 DIAGNOSIS — S72142A Displaced intertrochanteric fracture of left femur, initial encounter for closed fracture: Secondary | ICD-10-CM | POA: Diagnosis not present

## 2022-11-29 ENCOUNTER — Other Ambulatory Visit: Payer: Self-pay | Admitting: Family Medicine

## 2022-11-29 DIAGNOSIS — I509 Heart failure, unspecified: Secondary | ICD-10-CM

## 2022-12-01 ENCOUNTER — Other Ambulatory Visit: Payer: Self-pay | Admitting: *Deleted

## 2022-12-01 DIAGNOSIS — I10 Essential (primary) hypertension: Secondary | ICD-10-CM

## 2022-12-01 NOTE — Patient Outreach (Signed)
[facility] follow up.  Per [facility]amm[facility] with home health. Also EMMI notification triggered for follow up.   Telephone call made to Mrs. Tammie Martin [phone]. Patient identifiers confirmed. Discussed [facility] care coordination services. [name] lives with her daughter. [name] reports home health is coming out tomorrow. She does not remember the name of the agency right now. States she has PCP follow up and has discharge paperwork from the facility.   Mrs. Tammie Martin endorses she has had multiple medical issues back to back as of late. States she could benefit from mobile meals and may potentially need transportation assistance. Agreeable to Clinical research associatewriter making referrals for [facility] care coordinator and [facility] care guide for meals and transportation assistance.   Confirmed best contact number is [phone].   Confirmed with SNF social worker [facility] home health was arranged.   Will refer to [facility] care coordination team.   Raiford NobleAtika Kierstin January, MSN, RN,BSN [facility] Post [facility] Care Coordinator [phone] (Direct dial)

## 2022-12-02 ENCOUNTER — Telehealth: Payer: Self-pay | Admitting: *Deleted

## 2022-12-02 ENCOUNTER — Telehealth: Payer: Self-pay

## 2022-12-02 DIAGNOSIS — I480 Paroxysmal atrial fibrillation: Secondary | ICD-10-CM | POA: Diagnosis not present

## 2022-12-02 DIAGNOSIS — E039 Hypothyroidism, unspecified: Secondary | ICD-10-CM | POA: Diagnosis not present

## 2022-12-02 DIAGNOSIS — Z9181 History of falling: Secondary | ICD-10-CM | POA: Diagnosis not present

## 2022-12-02 DIAGNOSIS — Z96642 Presence of left artificial hip joint: Secondary | ICD-10-CM | POA: Diagnosis not present

## 2022-12-02 DIAGNOSIS — F17210 Nicotine dependence, cigarettes, uncomplicated: Secondary | ICD-10-CM | POA: Diagnosis not present

## 2022-12-02 DIAGNOSIS — Z7901 Long term (current) use of anticoagulants: Secondary | ICD-10-CM | POA: Diagnosis not present

## 2022-12-02 DIAGNOSIS — S72142D Displaced intertrochanteric fracture of left femur, subsequent encounter for closed fracture with routine healing: Secondary | ICD-10-CM | POA: Diagnosis not present

## 2022-12-02 DIAGNOSIS — R569 Unspecified convulsions: Secondary | ICD-10-CM | POA: Diagnosis not present

## 2022-12-02 DIAGNOSIS — Z8673 Personal history of transient ischemic attack (TIA), and cerebral infarction without residual deficits: Secondary | ICD-10-CM | POA: Diagnosis not present

## 2022-12-02 DIAGNOSIS — I10 Essential (primary) hypertension: Secondary | ICD-10-CM | POA: Diagnosis not present

## 2022-12-02 NOTE — Telephone Encounter (Signed)
   Telephone encounter was:  Successful.  12/02/2022 Name: WILLINE SPRAGG MRN: [id number] DOB: [birth year]  JEANITA BRIGNOLA is a 72 y.o. year old female who is a primary care patient of Raliegh Ip, DO . The community resource team was consulted for assistance with Transportation Needs  and Food Insecurity  Care guide performed the following interventions: Patient provided with information about care guide support team and interviewed to confirm resource needs.Patient has moms meals set up to be delivered on 4/10 and no transportation needs at this time but has resourcees I gave over the phone if needs come up   Follow Up Plan:  No further follow up planned at this time. The patient has been provided with needed resources.   Lenard Forth [facility] Guide, [facility] [phone] [address] [place], [place], [place] [address] Phone: [phone] Email: [email or web address]

## 2022-12-02 NOTE — Progress Notes (Signed)
  Care Coordination   Note   12/02/2022 Name: Tammie Martin MRN: [id number] DOB: 04-17-1951  Tammie Martin is a 72 y.o. year old female who sees Raliegh Ip, DO for primary care. I reached out to [name] by phone today to offer care coordination services.  [name] was given information about Care Coordination services today including:   The Care Coordination services include support from the care team which includes your Nurse Coordinator, Clinical Social Worker, or Pharmacist.  The Care Coordination team is here to help remove barriers to the health concerns and goals most important to you. Care Coordination services are voluntary, and the patient may decline or stop services at any time by request to their care team member.   Care Coordination Consent Status: Patient agreed to services and verbal consent obtained.   Follow up plan:  Telephone appointment with care coordination team member scheduled for:  12/09/22  Encounter Outcome:  Pt. Scheduled  [facility] Coordination Care Guide  Direct Dial: [phone]

## 2022-12-06 ENCOUNTER — Encounter: Payer: Self-pay | Admitting: Family Medicine

## 2022-12-06 ENCOUNTER — Telehealth (INDEPENDENT_AMBULATORY_CARE_PROVIDER_SITE_OTHER): Payer: Medicare HMO | Admitting: Family Medicine

## 2022-12-06 DIAGNOSIS — K644 Residual hemorrhoidal skin tags: Secondary | ICD-10-CM

## 2022-12-06 DIAGNOSIS — Z8781 Personal history of (healed) traumatic fracture: Secondary | ICD-10-CM | POA: Diagnosis not present

## 2022-12-06 DIAGNOSIS — E034 Atrophy of thyroid (acquired): Secondary | ICD-10-CM | POA: Diagnosis not present

## 2022-12-06 MED ORDER — HYDROCORTISONE (PERIANAL) 2.5 % EX CREA: 1.0000 | TOPICAL_CREAM | Freq: Two times a day (BID) | CUTANEOUS | 0 refills | Status: DC

## 2022-12-06 NOTE — Progress Notes (Signed)
MyChart Video visit  Subjective: CC: Hospital/nursing home discharge follow-up PCP: Raliegh IpGottschalk, Caress Reffitt M, DO ZOX:[id number]:Tammie Martin is a 72 y.o. female. Patient provides verbal consent for consult held via video.  Due to COVID-19 pandemic this visit was conducted virtually. This visit type was conducted due to national recommendations for restrictions regarding the COVID-19 Pandemic (e.g. social distancing, sheltering in place) in an effort to limit this patient's exposure and mitigate transmission in our community. All issues noted in this document were discussed and addressed.  A physical exam was not performed with this format.   Location of patient: home Location of provider: [facility] Others present for call: daughter  1.  History of hip fracture status post discharge from SNF She is walking with a walker.  PT to start tomorrow.  [facility]22.  Home health nurse has already come out.  She will be coming a few more times.  Patient reports that overall she has been doing okay since discharge.  She has been utilizing ibuprofen up to twice daily even though she continues to be anticoagulated.  She was under the impression that she was not allowed to use Tylenol and also has not utilized that.  She does report ongoing bleeding from her hemorrhoids and went without any type of hemorrhoid medication while she was in the SNF because they "could not get an order from her provider".  She is not entirely sure they actually tried to get 1 and I honestly do not know if it was being sent to me or the hospitalist.  However, she continues to have issues with this.  She is not performing sitz bath's but will start doing this.  Could not secure the suppositories because they were quite expensive and would like to see if we can send something else in.  She knows that perhaps GI may be necessary if she does not respond to these treatments as expected  ROS: Per HPI  Allergies  Allergen  Reactions   Calcium Channel Blockers    Penicillins Hives   Singulair [Montelukast Sodium] Other (See Comments)    headache   Chantix [Varenicline] Nausea Only    GI intolerance with higher doses   Codeine Rash   Nicoderm [Nicotine] Hives and Swelling   Other Rash    Band-aids cause rash after a few days   Past Medical History:  Diagnosis Date   Allergy    Anxiety    Atrial fibrillation (HCC)    Cancer (HCC) 1979   cervical cancer   Cataract    COPD (chronic obstructive pulmonary disease) (HCC)    Depression    DJD (degenerative joint disease)    Emphysema of lung (HCC)    Heart murmur    History of cervical cancer    Hyperlipidemia    Hypertension    Hypothyroidism    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 Excell Seltzerooper and Dr. Laneta SimmersBartle   Seizures [facility](HCC)    Severe aortic stenosis    Stroke [facility](HCC)    Substance abuse (HCC)    Thyroid disease    Tobacco abuse     Current Outpatient Medications:    albuterol (VENTOLIN HFA) 108 (90 Base) MCG/ACT inhaler, Inhale 2 puffs into the lungs every 6 (six) hours as needed for wheezing or shortness of breath., Disp: 8 g, Rfl: 0   atorvastatin (LIPITOR) 40 MG tablet, TAKE ONE  TABLET BY MOUTH DAILY, Disp: 90 tablet, Rfl: 3   azithromycin (ZITHROMAX) 500 MG tablet, Take 1 tablet (500 mg total) by mouth as directed. TAKE 500 MG  1 HOUR PRIOR TO ANY DENTAL  CLEANINGS AND PROCEDURES., Disp: 4 tablet, Rfl: 6   bisoprolol (ZEBETA) 10 MG tablet, Take 1 and 1/2 tablets (15 mg total) by mouth daily. (Patient taking differently: Take 15 mg by mouth daily.), Disp: 135 tablet, Rfl: 3   CALCIUM-MAGNESIUM PO, Take 1 tablet by mouth daily., Disp: , Rfl:    cetirizine (ZYRTEC) 10 MG tablet, Take 10 mg by mouth daily., Disp: , Rfl:    Cholecalciferol (VITAMIN D) 50 MCG (2000 UT) tablet, Take 2,000 Units by mouth daily., Disp: , Rfl:    desvenlafaxine (PRISTIQ) 50 MG  24 hr tablet, Take 1 tablet (50 mg total) by mouth daily. (NEEDS TO BE SEEN BEFORE NEXT REFILL), Disp: 30 tablet, Rfl: 0   ELIQUIS 5 MG TABS tablet, TAKE ONE TABLET BY MOUTH TWICE DAILY (Patient taking differently: Take 5 mg by mouth 2 (two) times daily.), Disp: 60 tablet, Rfl: 2   fluticasone (FLONASE) 50 MCG/ACT nasal spray, Place 2 sprays into both nostrils daily as needed for allergies or rhinitis., Disp: 48 g, Rfl: 3   furosemide (LASIX) 20 MG tablet, TAKE ONE TABLET BY MOUTH DAILY, Disp: 30 tablet, Rfl: 0   HYDROcodone-acetaminophen (NORCO/VICODIN) 5-325 MG tablet, Take 1 tablet by mouth every 6 (six) hours as needed., Disp: 20 tablet, Rfl: 0   hydrocortisone (ANUSOL-HC) 25 MG suppository, Place 1 suppository (25 mg total) rectally 2 (two) times daily as needed for hemorrhoids or anal itching., Disp: 12 suppository, Rfl: 0   hydrocortisone cream 1 %, Apply 1 application. topically 2 (two) times daily as needed (rash)., Disp: , Rfl:    lamoTRIgine (LAMICTAL) 25 MG tablet, Start with Lamotrigine 25mg  (one pill) at night for one week, then 25mg  (one pill) twice daily for one week, then 25mg  (one pill) morning and 50mg  (two pills) night for one week, then 50mg  (two pills) twice daily for one week then 75mg  (three pills) twice daily for one week then finally 100mg  (fours pills) twice daily (Patient taking differently: Take 25 mg by mouth See admin instructions. Start with Lamotrigine 25mg  (one pill) at night for one week, then 25mg  (one pill) twice daily for one week, then 25mg  (one pill) morning and 50mg  (two pills) night for one week, then 50mg  (two pills) twice daily for one week then 75mg  (three pills) twice daily for one week then finally 100mg  (fours pills) twice daily), Disp: 240 tablet, Rfl: 0   levETIRAcetam (KEPPRA) 750 MG tablet, Take 1 tablet (750 mg total) by mouth 2 (two) times daily., Disp: 180 tablet, Rfl: 3   levothyroxine (SYNTHROID) 88 MCG tablet, TAKE ONE TABLET BY MOUTH DAILY, Disp: 90  tablet, Rfl: 3   Multiple Vitamins-Minerals (ALIVE WOMENS 50+ PO), Take 1 tablet by mouth daily., Disp: , Rfl:    Multiple Vitamins-Minerals (PRESERVISION AREDS PO), Take 1 capsule by mouth 2 (two) times daily., Disp: , Rfl:    Omega-3-6-9 CAPS, Take 1 capsule by mouth 2 (two) times daily., Disp: , Rfl:    Polyethyl Glycol-Propyl Glycol (SYSTANE OP), Place 1 drop into both eyes daily as needed (dry eyes)., Disp: , Rfl:    polyethylene glycol powder (GLYCOLAX/MIRALAX) 17 GM/SCOOP powder, Mix 1 capful in 8 ounces of water and drink up to twice daily as needed for constipation (Patient not taking: Reported on 11/03/2022),  Disp: 225 g, Rfl: PRN   potassium chloride SA (KLOR-CON M) 10 MEQ tablet, Take 1 tablet (10 mEq total) by mouth daily., Disp: 30 tablet, Rfl: 3   Probiotic Product (PROBIOTIC ADVANCED PO), Take 1 capsule by mouth daily., Disp: , Rfl:    traZODone (DESYREL) 50 MG tablet, Take 1 tablet (50 mg total) by mouth at bedtime as needed for sleep., Disp: 90 tablet, Rfl: 3   TURMERIC PO, Take 1 capsule by mouth 2 (two) times daily., Disp: , Rfl:   Gen: Nontoxic female in no acute distress HEENT: Does not appear to be especially pale Neuro: Seems to be alert and oriented  Assessment/ Plan: 72 y.o. female   Bleeding external hemorrhoids - Plan: hydrocortisone (PROCTOSOL HC) 2.5 % rectal cream, CBC  Hypothyroidism due to acquired atrophy of thyroid - Plan: TSH, T4, free  History of fracture of left hip  Proctosol cream sent.  This appears to be covered by her insurance.  I am also going to go ahead and send over orders to the home health nurse to get both a CBC and thyroid levels drawn  I also prefer her not to be on ibuprofen since she is anticoagulated.  We discussed that it should be fine for her to take Tylenol arthritis twice daily as her last liver enzymes were normal x 2.  I think this would not only help with the bleeding hemorrhoids but would also decrease her overall risk of  bleeding.  Hemoglobin at discharge was 9.1 and this is down from her baseline of around 14.  Would like to see the patient in the next 6-8 weeks or so since she reports that she may be moving to [place].  I like to make sure that she is well taken care of prior to departure from my care.  My nurse will schedule  Start time: 11:38am End time: 11:50a  Total time spent on patient care (including video visit/ documentation): 12 minutes  Babygirl Trager Hulen Skains, DO [facility] [phone]

## 2022-12-09 ENCOUNTER — Encounter: Payer: Self-pay | Admitting: *Deleted

## 2022-12-09 ENCOUNTER — Ambulatory Visit: Payer: Self-pay | Admitting: *Deleted

## 2022-12-09 ENCOUNTER — Telehealth: Payer: Self-pay | Admitting: Pharmacist

## 2022-12-09 DIAGNOSIS — J410 Simple chronic bronchitis: Secondary | ICD-10-CM

## 2022-12-09 MED ORDER — BREZTRI AEROSPHERE 160-9-4.8 MCG/ACT IN AERO: 2.0000 | INHALATION_SPRAY | Freq: Two times a day (BID) | RESPIRATORY_TRACT | 11 refills | Status: AC

## 2022-12-09 NOTE — Patient Outreach (Signed)
  Care Coordination   Initial Visit Note   12/09/2022 Name: Tammie Martin MRN: [id number] DOB: [birth year]  Tammie Martin is a 72 y.o. year old female who sees Raliegh Ip, DO for primary care. I spoke with  Tammie Martin by phone today.  What matters to the patients health and wellness today?  Experiencing bleeding of her external hemorrhoids prior to and after facility stay  An order for medicine was obtained but not filled until she was close to being discharged from the facility  History of Eliquis use Seen by her pcp who encouraged sitz baths and prescribed hydrocortisone rectal cream, discussed referral to gastroenterology Presently using hemorrhoid cream without much success   March 2024 hospitalization for left hip fracture Using a walker at home  Has home health visits from a RN & PT  Confirms her home health nurse is scheduled to draw labs next week   Goals Addressed             This Visit's Progress    [facility] care coordination services   Not on track    Interventions Today    Flowsheet Row Most Recent Value  Chronic Disease   Chronic disease during today's visit Other  [active medication management assist from pcp pharmacist, external bleeding hemorrhoids, gastroenterology]  General Interventions   General Interventions Discussed/Reviewed General Interventions Discussed, Labs, [facility], Doctor Visits  Doctor Visits Discussed/Reviewed Doctor Visits Discussed, PCP, Specialist  PCP/Specialist Visits Compliance with follow-up visit  [discussed recent virtual visit with pcp and gastroenterology services]  Education Interventions   Education Provided Provided Web-based Education, Provided Education  [hemorrhoid, non surgery and surgery information]  Provided Verbal Education On Other  Tammie Martin, gastroenterology]  Pharmacy Interventions   Pharmacy Dicussed/Reviewed Pharmacy Topics Discussed, Medications and their functions  Production manager with pcp  pharmacist Julie]              SDOH assessments and interventions completed:  Yes  SDOH Interventions Today    Flowsheet Row Most Recent Value  SDOH Interventions   Food Insecurity Interventions Intervention Not Indicated  Stress Interventions Intervention Not Indicated        Care Coordination Interventions:  Yes, provided   Follow up plan: Follow up call scheduled for 12/15/22    Encounter Outcome:  Pt. Visit Completed    Stephane Niemann L. [name], RN, BSN, CCM [facility] Care Management Community Coordinator Office number [phone]

## 2022-12-09 NOTE — Patient Instructions (Addendum)
Visit Information  Thank you for taking time to visit with me today. Please don't hesitate to contact me if I can be of assistance to you.   Following are the goals we discussed today:   Goals Addressed             This Visit's Progress    [facility] care coordination services   Not on track    Interventions Today    Flowsheet Row Most Recent Value  Chronic Disease   Chronic disease during today's visit Other  [active medication management assist from pcp pharmacist, external bleeding hemorrhoids, gastroenterology]  General Interventions   General Interventions Discussed/Reviewed General Interventions Discussed, Labs, [facility], Doctor Visits  Doctor Visits Discussed/Reviewed Doctor Visits Discussed, PCP, Specialist  PCP/Specialist Visits Compliance with follow-up visit  [discussed recent virtual visit with pcp and gastroenterology services]  Education Interventions   Education Provided Provided Web-based Education, Provided Education  [hemorrhoid, non surgery and surgery information]  Provided Verbal Education On Other  Armanda Magic, gastroenterology]  Pharmacy Interventions   Pharmacy Dicussed/Reviewed Pharmacy Topics Discussed, Medications and their functions  [active with pcp pharmacist Julie]              Our next appointment is by telephone on 12/15/22 at 1100  Please call the care guide team at [phone] if you need to cancel or reschedule your appointment.   If you are experiencing a Mental Health or Behavioral Health Crisis or need someone to talk to, please call the Suicide and Crisis Lifeline: 988 call the [place] National Suicide Prevention Lifeline: (934)223-3803 or TTY: 347-508-6705 TTY (228)774-9463) to talk to a trained counselor call 1-800-273-TALK (toll free, 24 hour hotline) call the [facility]: 304-423-5147 call 911   Patient verbalizes understanding of instructions and care plan provided today and agrees to view in MyChart. Active  MyChart status and patient understanding of how to access instructions and care plan via MyChart confirmed with patient.     The patient has been provided with contact information for the care management team and has been advised to call with any health related questions or concerns.   Rachelann Enloe L. [name], RN, BSN, CCM [facility] Care Management Community Coordinator Office number [phone]

## 2022-12-09 NOTE — Patient Outreach (Signed)
  Care Coordination   12/09/2022 Name: Tammie Martin MRN: [id number] DOB: Oct 20, 1950   Care Coordination Outreach Attempts:  An unsuccessful telephone outreach was attempted today to offer the patient information about available care coordination services as a benefit of their health plan.   Follow Up Plan:  Additional outreach attempts will be made to offer the patient care coordination information and services.   Encounter Outcome:  Pt. Request to Call Back   Care Coordination Interventions:  No, not indicated      Akari Crysler L. [name], RN, BSN, CCM [facility] Care Management Community Coordinator Office number [phone]

## 2022-12-09 NOTE — Telephone Encounter (Signed)
Pt called stating that she had a visit with Raynelle Fanning today and needed to call back and tell Raynelle Fanning that she does need the Inhalers.

## 2022-12-09 NOTE — Telephone Encounter (Signed)
    12/09/2022 Name: Tammie Martin MRN: [id number] DOB: 1950-10-09   Markus Daft refills escribed to [facility] for [place] patient [place].  Continue current regimen.  Please email me PAP if patient not enrolled for 2024.   Kieth Brightly, PharmD, BCACP Clinical Pharmacist, [facility]

## 2022-12-13 ENCOUNTER — Telehealth: Payer: Medicare HMO | Admitting: Physician Assistant

## 2022-12-13 DIAGNOSIS — R3989 Other symptoms and signs involving the genitourinary system: Secondary | ICD-10-CM

## 2022-12-13 DIAGNOSIS — K649 Unspecified hemorrhoids: Secondary | ICD-10-CM

## 2022-12-13 DIAGNOSIS — S72009A Fracture of unspecified part of neck of unspecified femur, initial encounter for closed fracture: Secondary | ICD-10-CM

## 2022-12-13 NOTE — Progress Notes (Signed)
Because of having recent hip surgery and other issues, I feel your condition warrants further evaluation and I recommend that you be seen in a face to face visit.If you do have home health coming out then you may can see if they can get an order from your Primary care office for a urine specimen to be collected so it can be sent for a urine culture to determine the source before an antibiotic is started (will skew results).   NOTE: There will be NO CHARGE for this eVisit   If you are having a true medical emergency please call 911.      For an urgent face to face visit, [place] has eight urgent care centers for your convenience:   NEW!! [facility] Health Urgent Care Center at [facility] Get Driving Directions [phone] [address], [address] [place], [address]    [facility] Health Urgent Care Center at [facility] Get Driving Directions [phone] [address] [place], [place] [address]   [facility] Health Urgent Care Center [facility]) Get Driving Directions [phone] [address] [place], [place] [address]  [facility] Health Urgent Care Center [facility]) Get Driving Directions [phone] [address] [place],  [place]  [address]  [facility] Health Urgent Care Center [facility] - at [employer]  [phone] [address] [place],  [place] [address]   [facility] Health Urgent Care at [facility] Get Driving Directions [phone] [address], [address] [place], [place] [address]   [facility] Health Urgent Care at [facility] Get Driving Directions  [phone] [address] [place], [place] [address]   [facility] Health Urgent Care at [facility] Directions [phone] [address], [address] [place], [place] [address]  Your MyChart E-visit questionnaire answers were reviewed by a board certified advanced clinical practitioner to complete your personal  care plan based on your specific symptoms.  Thank you for using e-Visits.    I have spent 5 minutes in review of e-visit questionnaire, review and updating patient chart, medical decision making and response to patient.   Margaretann Loveless, PA-C

## 2022-12-14 ENCOUNTER — Encounter (HOSPITAL_COMMUNITY): Payer: Self-pay

## 2022-12-14 ENCOUNTER — Emergency Department (HOSPITAL_COMMUNITY)
Admission: EM | Admit: 2022-12-14 | Discharge: 2022-12-15 | Disposition: A | Discharge status: Home / Self Care | Payer: Medicare HMO | Attending: Emergency Medicine | Admitting: Emergency Medicine

## 2022-12-14 ENCOUNTER — Other Ambulatory Visit: Payer: Self-pay

## 2022-12-14 ENCOUNTER — Emergency Department (HOSPITAL_COMMUNITY): Payer: Medicare HMO

## 2022-12-14 DIAGNOSIS — I4891 Unspecified atrial fibrillation: Secondary | ICD-10-CM | POA: Diagnosis not present

## 2022-12-14 DIAGNOSIS — K648 Other hemorrhoids: Secondary | ICD-10-CM | POA: Diagnosis not present

## 2022-12-14 DIAGNOSIS — I10 Essential (primary) hypertension: Secondary | ICD-10-CM | POA: Insufficient documentation

## 2022-12-14 DIAGNOSIS — K649 Unspecified hemorrhoids: Secondary | ICD-10-CM

## 2022-12-14 DIAGNOSIS — R531 Weakness: Secondary | ICD-10-CM | POA: Diagnosis not present

## 2022-12-14 DIAGNOSIS — D649 Anemia, unspecified: Secondary | ICD-10-CM

## 2022-12-14 DIAGNOSIS — R5383 Other fatigue: Secondary | ICD-10-CM | POA: Diagnosis not present

## 2022-12-14 DIAGNOSIS — R3915 Urgency of urination: Secondary | ICD-10-CM | POA: Insufficient documentation

## 2022-12-14 DIAGNOSIS — Z7951 Long term (current) use of inhaled steroids: Secondary | ICD-10-CM | POA: Diagnosis not present

## 2022-12-14 DIAGNOSIS — R7989 Other specified abnormal findings of blood chemistry: Secondary | ICD-10-CM | POA: Insufficient documentation

## 2022-12-14 DIAGNOSIS — Z7901 Long term (current) use of anticoagulants: Secondary | ICD-10-CM | POA: Diagnosis not present

## 2022-12-14 DIAGNOSIS — K625 Hemorrhage of anus and rectum: Secondary | ICD-10-CM | POA: Diagnosis not present

## 2022-12-14 DIAGNOSIS — Z79899 Other long term (current) drug therapy: Secondary | ICD-10-CM | POA: Insufficient documentation

## 2022-12-14 DIAGNOSIS — J449 Chronic obstructive pulmonary disease, unspecified: Secondary | ICD-10-CM | POA: Insufficient documentation

## 2022-12-14 LAB — URINALYSIS, ROUTINE W REFLEX MICROSCOPIC
Bilirubin Urine: NEGATIVE
Glucose, UA: NEGATIVE mg/dL
Hgb urine dipstick: NEGATIVE
Ketones, ur: NEGATIVE mg/dL
Leukocytes,Ua: NEGATIVE
Nitrite: NEGATIVE
Protein, ur: 30 mg/dL — AB
Specific Gravity, Urine: 1.02 (ref 1.005–1.030)
pH: 5 (ref 5.0–8.0)

## 2022-12-14 LAB — CBC WITH DIFFERENTIAL/PLATELET
Abs Immature Granulocytes: 0.02 10*3/uL (ref 0.00–0.07)
Basophils Absolute: 0 10*3/uL (ref 0.0–0.1)
Basophils Relative: 0 %
Eosinophils Absolute: 0.1 10*3/uL (ref 0.0–0.5)
Eosinophils Relative: 1 %
HCT: 29.9 % — ABNORMAL LOW (ref 36.0–46.0)
Hemoglobin: 9.5 g/dL — ABNORMAL LOW (ref 12.0–15.0)
Immature Granulocytes: 0 %
Lymphocytes Relative: 34 %
Lymphs Abs: 3.2 10*3/uL (ref 0.7–4.0)
MCH: 32 pg (ref 26.0–34.0)
MCHC: 31.8 g/dL (ref 30.0–36.0)
MCV: 100.7 fL — ABNORMAL HIGH (ref 80.0–100.0)
Monocytes Absolute: 0.7 10*3/uL (ref 0.1–1.0)
Monocytes Relative: 7 %
Neutro Abs: 5.5 10*3/uL (ref 1.7–7.7)
Neutrophils Relative %: 58 %
Platelets: 305 10*3/uL (ref 150–400)
RBC: 2.97 MIL/uL — ABNORMAL LOW (ref 3.87–5.11)
RDW: 14.6 % (ref 11.5–15.5)
WBC: 9.4 10*3/uL (ref 4.0–10.5)
nRBC: 0 % (ref 0.0–0.2)

## 2022-12-14 LAB — COMPREHENSIVE METABOLIC PANEL
ALT: 195 U/L — ABNORMAL HIGH (ref 0–44)
AST: 323 U/L — ABNORMAL HIGH (ref 15–41)
Albumin: 2.8 g/dL — ABNORMAL LOW (ref 3.5–5.0)
Alkaline Phosphatase: 182 U/L — ABNORMAL HIGH (ref 38–126)
Anion gap: 10 (ref 5–15)
BUN: 22 mg/dL (ref 8–23)
CO2: 26 mmol/L (ref 22–32)
Calcium: 8.5 mg/dL — ABNORMAL LOW (ref 8.9–10.3)
Chloride: 98 mmol/L (ref 98–111)
Creatinine, Ser: 0.84 mg/dL (ref 0.44–1.00)
GFR, Estimated: >60 mL/min (ref >60)
Glucose, Bld: 97 mg/dL (ref 70–99)
Potassium: 4.4 mmol/L (ref 3.5–5.1)
Sodium: 134 mmol/L — ABNORMAL LOW (ref 135–145)
Total Bilirubin: 0.8 mg/dL (ref 0.3–1.2)
Total Protein: 6.8 g/dL (ref 6.5–8.1)

## 2022-12-14 LAB — PROTIME-INR
INR: 1.5 — ABNORMAL HIGH (ref 0.8–1.2)
Prothrombin Time: 18.1 seconds — ABNORMAL HIGH (ref 11.4–15.2)

## 2022-12-14 LAB — POC OCCULT BLOOD, ED: Fecal Occult Bld: POSITIVE — AB

## 2022-12-14 MED ORDER — OXYCODONE HCL 5 MG PO TABS: 2.5000 mg | ORAL_TABLET | ORAL | 0 refills | Status: DC | PRN

## 2022-12-14 MED ORDER — SODIUM CHLORIDE 0.9 % IV BOLUS
1000.0000 mL | Freq: Once | INTRAVENOUS | Status: AC
  Administered 2022-12-14: 1000 mL via INTRAVENOUS

## 2022-12-14 NOTE — ED Notes (Signed)
Pt took O2 off and O2 sat was 88%. Placed pt back on [facility]. O2 now at 97%

## 2022-12-14 NOTE — ED Notes (Signed)
EDPA Provider at bedside. 

## 2022-12-14 NOTE — ED Notes (Signed)
Pt placed on 3L [place]. On assessment, pt's O2 was at 87% RA. Placed pt on 2L, O2 sat came up to 92%. 3L [place], pt's O2 between 93-95%

## 2022-12-14 NOTE — Discharge Instructions (Signed)
Please follow closely with  Gastroenterology. Get help right away if: You feel confused, feel like you might faint, or faint. Your vision is blurry or you have a severe headache. You have severe pain in your abdomen, your back, or the area between your waist and hips (pelvis). You have chest pain, shortness of breath, or an irregular or fast heartbeat. You are unable to urinate, or you urinate less than normal. You have abnormal bleeding from the rectum, nose, lungs, nipples, or, if you are female, the vagina. You vomit blood. You have thoughts about hurting yourself or others. These symptoms may be an emergency. Get help right away. Call 911. Do not wait to see if the symptoms will go away. Do not drive yourself to the hospital. Get help right away if you feel like you may hurt yourself or others, or have thoughts about taking your own life. Go to your nearest emergency room or: Call 911. Call the National Suicide Prevention Lifeline at (920)633-4575 or 988. This is open 24 hours a day. Text the Crisis Text Line at 405-210-2957.

## 2022-12-14 NOTE — ED Provider Notes (Signed)
Clinical Course as of 12/15/22 0848  Wed Dec 14, 2022  1931 BUN: 22 [AH]  1931 Creatinine: 0.84 [AH]  1931 Fecal Occult Blood, POC(!): POSITIVE Generalized weakness, recent dc from SNF. Bleeding hemorrhoid. Urinary urgency - thinks its a uti [AH]    Clinical Course User Index [AH] Arthor Captain, PA-C   72 year old female who presents for generalized weakness. Patient given at shift handoff around 7:30 PM from [facility].  I discussed current findings of lab results.  Still awaiting urinalysis.  Patient states that she just has not been able to urinate and agrees to In-N-Out catheterization.  She reports that she has been feeling fatigued for months.  She states it started before she encountered a multitude of back to back health events.  Patient states she was already feeling fatigued all the time but then had a TAVR followed by development of A-fib, a stroke and then recently had a femur fracture requiring SNF placement.  Patient states that she has been out of the SNF for the past month.  She states that she has exhausted all the time and barely has the energy to get out of bed.  She has compounding factors of chronic hypoxic respiratory failure requiring nasal cannula and oxygenation, chronically bleeding hemorrhoids in the setting of anticoagulation, recent injury, hospitalization and SNF placed likely contributing to deconditioning.  Patient also complains that she is feeling pain all of her body.  Notably patient has elevated LFTs.  She states that this happened the last time she was taking Tylenol.  Patient states that she feels like if her generalized pain was improved she would might have a little bit more energy.  I am willing to prescribe a short course of oxycodone for the patient with bowel regimen and precautions.  Patient's medication choices are limited as her LFTs are mildly elevated at this time, she cannot take NSAIDs due to her use of anticoagulation.  PDMP reviewed during this  encounter. Given patient's slowly decreasing hemoglobin level, chronic fatigue and elevated LFTs I suggested that patient should see gastroenterology and close follow-up for further evaluation.  Patient does not feel like she needs to be hospitalized at this time for her chronic fatigue.  I doubt any other emergent cause of her significant and prolonged fatigue.  I still suspect that this is multifactorial.  Patient is in agreement.  Patient given referral to on-call gastroenterology.  Urine returned and is negative for infection.  She appears otherwise appropriate for discharge at this time with close return precautions.   Arthor Captain, PA-C 12/15/22 1610    Derwood Kaplan, MD 12/17/22 [phone]

## 2022-12-14 NOTE — ED Notes (Signed)
ED Provider at bedside. 

## 2022-12-14 NOTE — ED Triage Notes (Signed)
Pt presents to ED with multiple complaints, including urinary urgency, frequency with little urine output, nausea, decreased appetite and hemorrhoids. Pt states, "I just feel lousy"

## 2022-12-14 NOTE — ED Provider Notes (Signed)
[place] EMERGENCY DEPARTMENT AT [facility] Provider Note   CSN: [id number] Arrival date & time: 12/14/22  1503     History  Chief Complaint  Patient presents with   multiple complaints    UTI sx, weakness, nausea, hemorrhoids     Tammie Martin is a 72 y.o. female.  HPI     Tammie Martin is a 72 y.o. female with past medical history of hypertension, generalized anxiety disorder, aortic valve replacement, prior CVA, COPD, atrial fib, and seizures who presents to the Emergency Department with multiple complaints.  She is anticoagulated on Eliquis.  She describes history of bleeding hemorrhoids, decreased appetite, generalized weakness, and urinary urgency.  Symptoms present for several days.  States she has not felt well since being discharged earlier this month from nursing facility in which she was in rehab for hip fracture.  Patient sister who is at at bedside notes that she only drinks small amounts and only ate 1 hot dog yesterday.  Patient states bleeding hemorrhoids is recurrent problem for her.  She is concerned that she has a urinary tract infection as she describes urgency of urination but only voids small amounts.  She denies abdominal pain, chest pain, shortness of breath, fever or chills.   Home Medications Prior to Admission medications   Medication Sig Start Date End Date Taking? Authorizing Provider  albuterol (VENTOLIN HFA) 108 (90 Base) MCG/ACT inhaler Inhale 2 puffs into the lungs every 6 (six) hours as needed for wheezing or shortness of breath. 11/01/21  Yes Gottschalk, Kathie Rhodes M, DO  atorvastatin (LIPITOR) 40 MG tablet TAKE ONE TABLET BY MOUTH DAILY 06/28/22  Yes Gottschalk, Ashly M, DO  bisoprolol (ZEBETA) 10 MG tablet Take 1 and 1/2 tablets (15 mg total) by mouth daily. Patient taking differently: Take 15 mg by mouth daily. 09/02/22  Yes BranchDorothe Pea, MD  Budeson-Glycopyrrol-Formoterol (BREZTRI AEROSPHERE) 160-9-4.8 MCG/ACT AERO Inhale 2  puffs into the lungs 2 (two) times daily. 12/09/22  Yes Delynn Flavin M, DO  CALCIUM-MAGNESIUM PO Take 1 tablet by mouth daily.   Yes [provider]  cetirizine (ZYRTEC) 10 MG tablet Take 10 mg by mouth daily.   Yes [provider]  Cholecalciferol (VITAMIN D) 50 MCG (2000 UT) tablet Take 2,000 Units by mouth daily.   Yes [provider]  desvenlafaxine (PRISTIQ) 50 MG 24 hr tablet Take 1 tablet (50 mg total) by mouth daily. (NEEDS TO BE SEEN BEFORE NEXT REFILL) 09/26/22  Yes Gottschalk, Ashly M, DO  ELIQUIS 5 MG TABS tablet TAKE ONE TABLET BY MOUTH TWICE DAILY Patient taking differently: Take 5 mg by mouth 2 (two) times daily. 06/10/22  Yes Gottschalk, Ashly M, DO  fluticasone (FLONASE) 50 MCG/ACT nasal spray Place 2 sprays into both nostrils daily as needed for allergies or rhinitis. 10/28/20  Yes Gottschalk, Kathie Rhodes M, DO  furosemide (LASIX) 20 MG tablet TAKE ONE TABLET BY MOUTH DAILY 11/29/22  Yes Delynn Flavin M, DO  hydrocortisone (PROCTOSOL HC) 2.5 % rectal cream Place 1 Application rectally 2 (two) times daily. 12/06/22  Yes Gottschalk, Kathie Rhodes M, DO  hydrocortisone cream 1 % Apply 1 application. topically 2 (two) times daily as needed (rash).   Yes [provider]  levETIRAcetam (KEPPRA) 750 MG tablet Take 1 tablet (750 mg total) by mouth 2 (two) times daily. 03/10/22 03/05/23 Yes Windell Norfolk, MD  levothyroxine (SYNTHROID) 88 MCG tablet TAKE ONE TABLET BY MOUTH DAILY 09/08/22  Yes Raliegh Ip, DO  Multiple  Vitamins-Minerals (ALIVE WOMENS 50+ PO) Take 1 tablet by mouth daily.   Yes [provider]  Multiple Vitamins-Minerals (PRESERVISION AREDS PO) Take 1 capsule by mouth 2 (two) times daily.   Yes [provider]  Omega-3-6-9 CAPS Take 1 capsule by mouth 2 (two) times daily.   Yes [provider]  Polyethyl Glycol-Propyl Glycol (SYSTANE OP) Place 1 drop into both eyes daily as needed (dry eyes).   Yes [provider]   potassium chloride (KLOR-CON) 10 MEQ tablet Take 10 mEq by mouth daily. 11/24/22  Yes [provider]  Probiotic Product (PROBIOTIC ADVANCED PO) Take 1 capsule by mouth daily.   Yes [provider]  traZODone (DESYREL) 50 MG tablet Take 1 tablet (50 mg total) by mouth at bedtime as needed for sleep. 03/18/22  Yes Gottschalk, Ashly M, DO  TURMERIC PO Take 1 capsule by mouth 2 (two) times daily.   Yes [provider]  HYDROcodone-acetaminophen (NORCO/VICODIN) 5-325 MG tablet Take 1 tablet by mouth every 6 (six) hours as needed. Patient not taking: Reported on 12/14/2022 11/07/22 11/07/23  [place], Jesse J, PA-C  lamoTRIgine (LAMICTAL) 100 MG tablet Take 100 mg by mouth 2 (two) times daily. Patient not taking: Reported on 12/14/2022 11/24/22   [provider]  lamoTRIgine (LAMICTAL) 25 MG tablet Start with Lamotrigine 25mg  (one pill) at night for one week, then 25mg  (one pill) twice daily for one week, then 25mg  (one pill) morning and 50mg  (two pills) night for one week, then 50mg  (two pills) twice daily for one week then 75mg  (three pills) twice daily for one week then finally 100mg  (fours pills) twice daily Patient not taking: Reported on 12/14/2022 09/12/22   Windell Norfolk, MD      Allergies    Calcium channel blockers, Penicillins, Singulair [montelukast sodium], Chantix [varenicline], Codeine, Nicoderm [nicotine], and Other    Review of Systems   Review of Systems  Constitutional:  Negative for appetite change, chills and fever.  Respiratory:  Negative for chest tightness and shortness of breath.   Cardiovascular:  Negative for chest pain.  Gastrointestinal:  Positive for anal bleeding and rectal pain. Negative for abdominal pain, nausea and vomiting.       History of bleeding hemorrhoids  Genitourinary:  Positive for dysuria and urgency.  Musculoskeletal:  Negative for back pain.  Skin:  Negative for rash.  Neurological:  Negative for dizziness, weakness,  numbness and headaches.    Physical Exam Updated Vital Signs BP (!) 145/80 (BP Location: Right Arm)   Pulse 79   Temp 98.1 F (36.7 C) (Oral)   Resp 12   Ht 5\' 2"  (1.575 m)   Wt 52.2 kg   SpO2 94%   BMI 21.03 kg/m  Physical Exam Vitals and nursing note reviewed. Exam conducted with a chaperone present.  Constitutional:      General: She is not in acute distress.    Appearance: Normal appearance. She is not toxic-appearing.  HENT:     Mouth/Throat:     Mouth: Mucous membranes are dry.     Comments: Mucous membranes very dry Cardiovascular:     Rate and Rhythm: Normal rate and regular rhythm.     Pulses: Normal pulses.  Pulmonary:     Effort: Pulmonary effort is normal.  Abdominal:     General: There is no distension.     Palpations: Abdomen is soft.     Tenderness: There is no abdominal tenderness.  Genitourinary:    Rectum: Guaiac result  positive. External hemorrhoid and internal hemorrhoid present. Normal anal tone.     Comments: 1 small external nonthrombosed hemorrhoid present without active bleeding.  Digital rectal exam performed by me, multiple, palpable internal hemorrhoids.  Brown heme positive stool present. Musculoskeletal:        General: Normal range of motion.     Right lower leg: No edema.     Left lower leg: No edema.  Skin:    General: Skin is warm.     Capillary Refill: Capillary refill takes less than 2 seconds.  Neurological:     General: No focal deficit present.     Mental Status: She is alert.     Sensory: No sensory deficit.     Motor: No weakness.     ED Results / Procedures / Treatments   Labs (all labs ordered are listed, but only abnormal results are displayed) Labs Reviewed  CBC WITH DIFFERENTIAL/PLATELET - Abnormal; Notable for the following components:      Result Value   RBC 2.97 (*)    Hemoglobin 9.5 (*)    HCT 29.9 (*)    MCV 100.7 (*)    All other components within normal limits  COMPREHENSIVE METABOLIC PANEL - Abnormal;  Notable for the following components:   Sodium 134 (*)    Calcium 8.5 (*)    Albumin 2.8 (*)    AST 323 (*)    ALT 195 (*)    Alkaline Phosphatase 182 (*)    All other components within normal limits  PROTIME-INR - Abnormal; Notable for the following components:   Prothrombin Time 18.1 (*)    INR 1.5 (*)    All other components within normal limits  POC OCCULT BLOOD, ED - Abnormal; Notable for the following components:   Fecal Occult Bld POSITIVE (*)    All other components within normal limits  URINALYSIS, ROUTINE W REFLEX MICROSCOPIC    EKG None  Radiology No results found.  Procedures Procedures    Medications Ordered in ED Medications  sodium chloride 0.9 % bolus 1,000 mL (1,000 mLs Intravenous New Bag/Given 12/14/22 1843)    ED Course/ Medical Decision Making/ A&P Clinical Course as of 12/14/22 1949  Wed Dec 14, 2022  1931 BUN: 22 [AH]  1931 Creatinine: 0.84 [AH]  1931 Fecal Occult Blood, POC(!): POSITIVE Generalized weakness, recent dc from SNF. Bleeding hemorrhoid. Urinary urgency - thinks its a uti [AH]    Clinical Course User Index [AH] Arthor Captain, PA-C                             Medical Decision Making Patient here with multiple complaints.  Describes generalized weakness, fatigue, decreased appetite, urinary urgency rectal pain secondary to bleeding hemorrhoids.  Symptoms present for several days.  States she has not felt well since she was discharged from rehab facility for hip fracture.  No reported fever vomiting or abdominal pain.  Differential would include but not limited to viral process, dehydration, acute GI bleed, infectious process, sepsis.    Sepsis considered but at this time felt less likely as patient does not meet SIRS criteria.  Amount and/or Complexity of Data Reviewed Labs: ordered.    Details: Labs interpreted by me, no evidence of leukocytosis, hemoglobin 9.5 hemoglobin similar 1 month ago.  Chemistries show elevated  transaminases.  AST of 323, ALT 195, alk phos 182.  Total bilirubin unremarkable.  Hepatitis panel pending.  INR 1.5.  Hemoccult  stool positive Radiology: ordered.    Details: Chest x-ray results pending ECG/medicine tests: ordered.    Details: EKG shows atrial fibrillation.  History of same Discussion of management or test interpretation with external provider(s): Patient here with generalized weakness symptoms of possible UTI, decreased appetite failure to thrive at home. If urine positive, patient may require hospitalization may also need placement back to SNF Discussed with Arthor Captain, PA-C who agrees to arrange disposition pending remaining labs and chest x-ray.           Final Clinical Impression(s) / ED Diagnoses Final diagnoses:  None    Rx / DC Orders ED Discharge Orders     None         Pauline Aus, PA-C 12/14/22 Norlene Campbell, MD 12/15/22 [phone]

## 2022-12-15 ENCOUNTER — Ambulatory Visit: Payer: Self-pay

## 2022-12-15 ENCOUNTER — Other Ambulatory Visit: Payer: Self-pay | Admitting: Family Medicine

## 2022-12-15 ENCOUNTER — Ambulatory Visit: Payer: Self-pay | Admitting: *Deleted

## 2022-12-15 DIAGNOSIS — I509 Heart failure, unspecified: Secondary | ICD-10-CM

## 2022-12-15 LAB — HEPATITIS PANEL, ACUTE
HCV Ab: NONREACTIVE
Hep A IgM: NONREACTIVE
Hep B C IgM: NONREACTIVE
Hepatitis B Surface Ag: NONREACTIVE

## 2022-12-15 NOTE — Transitions of Care (Post Inpatient/ED Visit) (Signed)
   12/15/2022  Name: Tammie Martin MRN: [id number] DOB: 1950/09/19  Today's TOC FU Call Status: Today's TOC FU Call Status:: Successful TOC FU Call Competed TOC FU Call Complete Date: 12/15/22  Attempted to reach the patient regarding the most recent Inpatient/ED visit.  Follow Up Plan: Additional outreach attempts will be made to reach the patient to complete the Transitions of Care (Post Inpatient/ED visit) call.   Kymberlyn Eckford L. [name], RN, BSN, CCM [facility] Care Management Community Coordinator Office number [phone]

## 2022-12-15 NOTE — Chronic Care Management (AMB) (Signed)
   12/15/2022  [name] 05/15/1951 [id number]   Reason for Encounter: Patient is not currently enrolled in the CCM program. CCM status changed to previously enrolled.   Katha Cabal RN Care Manager/Chronic Care Management [phone]

## 2022-12-15 NOTE — Patient Instructions (Addendum)
Visit Information  Thank you for taking time to visit with me today. Please don't hesitate to contact me if I can be of assistance to you.   Following are the goals we discussed today:   Goals Addressed             This Visit's Progress    [facility] care coordination services   Not on track    Interventions Today    Flowsheet Row Most Recent Value  Chronic Disease   Chronic disease during today's visit --  [social concern, recent ED visit & follow up care & concerns- Bleeding , hemorrhoids, nausea, difficulty voiding and fatigue]  General Interventions   General Interventions Discussed/Reviewed General Interventions Reviewed, Labs, [facility], Doctor Visits, Communication with  Labs --  [facility] decreasing]  Doctor Visits Discussed/Reviewed Doctor Visits Reviewed, PCP, Specialist  PCP/Specialist Visits --  [encouraged ED follow up with pcp/specialist]  Communication with PCP/Specialists  Exercise Interventions   Exercise Discussed/Reviewed Exercise Discussed, Physical Activity  Physical Activity Discussed/Reviewed Physical Activity Discussed  [confirmed she has been unable to get out of bed related to fatigue]  Education Interventions   Education Provided Provided Education  [[facility] SW services, Care coordination programs, post ED/hsoptial visits]  Provided Verbal Education On Other, Labs  Labs Reviewed --  [anemia hemaglobin decreases]              Our next appointment is by telephone on 12/22/22 at 1 pm  Please call the care guide team at [phone] if you need to cancel or reschedule your appointment.   If you are experiencing a Mental Health or Behavioral Health Crisis or need someone to talk to, please call the Suicide and Crisis Lifeline: 988 call the [place] National Suicide Prevention Lifeline: 229-640-2085 or TTY: 704-737-2352 TTY 929-235-4865) to talk to a trained counselor call 1-800-273-TALK (toll free, 24 hour hotline) call the [facility]: 339-451-8068 call 911   Patient verbalizes understanding of instructions and care plan provided today and agrees to view in MyChart. Active MyChart status and patient understanding of how to access instructions and care plan via MyChart confirmed with patient.     The patient has been provided with contact information for the care management team and has been advised to call with any health related questions or concerns.   Shakya Sebring L. [name], RN, BSN, CCM [facility] Care Management Community Coordinator Office number [phone]

## 2022-12-15 NOTE — Patient Outreach (Signed)
  Care Coordination   Follow Up Visit Note   12/15/2022 Name: Tammie Martin MRN: [id number] DOB: 1951-02-02  Tammie Martin is a 72 y.o. year old female who sees Raliegh Ip, DO for primary care. I spoke with  [name] by phone today.  What matters to the patients health and wellness today?  At sister home presently as she reports some social concerns with remaining in her apartment She denies need of assistance from [facility] RN CM and [facility] SW at this time She reports she may be moving to [facility] to be near other family Toma Copier) She voiced concern about [facility] or equivalent services in [facility]  She confirms her recent ED visit for bleeding, hemorrhoids, nausea, difficulty voiding and fatigue. ED MD noted slowly decreasing hemoglobin level, negative UA, chronic fatigue and elevated LFTs + suggested that patient should see gastroenterology  and close follow up evaluation Transition of care completed She reports continued issues with not being able to void even after an in and out cath completed during the ED visit but is no longer bleeding Patient needs a pcp ED follow up but wants to schedule it herself and will outreach to RN CM if she decides assistance is needed from RN CM and SW She confirms the correct number for [facility] RN CM      Goals Addressed             This Visit's Progress    [facility] care coordination services   Not on track    Interventions Today    Flowsheet Row Most Recent Value  Chronic Disease   Chronic disease during today's visit --  [social concern, recent ED visit & follow up care & concerns- Bleeding , hemorrhoids, nausea, difficulty voiding and fatigue]  General Interventions   General Interventions Discussed/Reviewed General Interventions Reviewed, Labs, [facility], Doctor Visits, Communication with  Labs --  [facility] decreasing]  Doctor Visits Discussed/Reviewed Doctor Visits Reviewed, PCP, Specialist  PCP/Specialist Visits --  [encouraged ED  follow up with pcp/specialist]  Communication with PCP/Specialists  Exercise Interventions   Exercise Discussed/Reviewed Exercise Discussed, Physical Activity  Physical Activity Discussed/Reviewed Physical Activity Discussed  [confirmed she has been unable to get out of bed related to fatigue]  Education Interventions   Education Provided Provided Education  [[facility] SW services, Care coordination programs, post ED/hsoptial visits]  Provided Verbal Education On Other, Labs  Labs Reviewed --  [anemia hemaglobin decreases]              SDOH assessments and interventions completed:  Yes  SDOH Interventions Today    Flowsheet Row Most Recent Value  SDOH Interventions   Housing Interventions Patient Refused, Other (Comment)  [offered assistance from [facility] RN CM & SW. Presently not needed per patient]  Transportation Interventions Intervention Not Indicated  Financial Strain Interventions Intervention Not Indicated  Stress Interventions Intervention Not Indicated        Care Coordination Interventions:  Yes, provided   Follow up plan: Follow up call scheduled for 12/22/22    Encounter Outcome:  Pt. Visit Completed   Eldonna Neuenfeldt L. [name], RN, BSN, CCM [facility] Care Management Community Coordinator Office number [phone]

## 2022-12-16 ENCOUNTER — Telehealth: Payer: Self-pay | Admitting: *Deleted

## 2022-12-16 ENCOUNTER — Ambulatory Visit: Payer: Self-pay | Admitting: *Deleted

## 2022-12-16 NOTE — Patient Outreach (Signed)
  Care Coordination   Follow Up Visit Note   12/16/2022 Name: Tammie Martin MRN: [id number] DOB: Apr 02, 1951  Tammie Martin is a 72 y.o. year old female who sees Raliegh Ip, DO for primary care. I spoke with  Tammie Martin by phone today.  What matters to the patients health and wellness today?  Patient visited by [facility], outpatient follow up recommended. Patient confirms feeling much better after the visit. Confirms feeling safe and not a risk of hurting herself. Will continue to stay with her sister temporarily. Agrees to contact [phone] if a mental health crisis occurs.   Goals Addressed             This Visit's Progress    "I want to go somewhere inpatient to get better"       Care Coordination Interventions: Confirmed that patient was visited by [facility] today, outpatient therapy recommended and resources provided            SDOH assessments and interventions completed:  Yes  SDOH Interventions Today    Flowsheet Row Most Recent Value  SDOH Interventions   Food Insecurity Interventions Intervention Not Indicated  Housing Interventions Intervention Not Indicated  [patient plans to move in her other daughter in [place]]  Transportation Interventions Intervention Not Indicated  Depression Interventions/Treatment  --  [Mobile crisis contacted]        Care Coordination Interventions:  Yes, provided  Interventions Today    Flowsheet Row Most Recent Value  Chronic Disease   Chronic disease during today's visit Other  [depression]  General Interventions   General Interventions Discussed/Reviewed General Interventions Reviewed, Community Resources  Mental Health Interventions   Mental Health Discussed/Reviewed Mental Health Reviewed, Depression  Refer to Social Work for counseling regarding Other  [patient assessed by [facility] -outpatient follow up recommended-resources provided]  Safety Interventions   Safety  Discussed/Reviewed Safety Reviewed  [patient enocuraged to dial [phone] in the event of a mental health crisis]       Follow up plan: Follow up call scheduled for 12/19/22 3:45pm    Encounter Outcome:  Pt. Visit Completed

## 2022-12-16 NOTE — Patient Instructions (Signed)
Visit Information  Thank you for taking time to visit with me today. Please don't hesitate to contact me if I can be of assistance to you.   Following are the goals we discussed today:   Goals Addressed             This Visit's Progress    "I want to go somewhere inpatient to get better"       Care Coordination Interventions: Confirmed that patient was visited by [facility] today, outpatient therapy recommended and resources provided            Our next appointment is by telephone on 12/19/22 at 3:45pm with Danford Bad, LCSW  Please call the care guide team at [phone] if you need to cancel or reschedule your appointment.   If you are experiencing a Mental Health or Behavioral Health Crisis or need someone to talk to, please call the Suicide and Crisis Lifeline: 988   Patient verbalizes understanding of instructions and care plan provided today and agrees to view in MyChart. Active MyChart status and patient understanding of how to access instructions and care plan via MyChart confirmed with patient.     Telephone follow up appointment with care management team member scheduled for: 12/19/22  Verna Czech, LCSW Clinical Social Worker  [facility] Care Management [phone]

## 2022-12-16 NOTE — Patient Instructions (Signed)
Visit Information  Thank you for taking time to visit with me today. Please don't hesitate to contact me if I can be of assistance to you.   Following are the goals we discussed today:   Goals Addressed   None     Our next appointment is by telephone on 12/22/22 at 1 pm  Please call the care guide team at [phone] if you need to cancel or reschedule your appointment.   If you are experiencing a Mental Health or Behavioral Health Crisis or need someone to talk to, please call the Suicide and Crisis Lifeline: 988 call the [place] National Suicide Prevention Lifeline: 731-063-4685 or TTY: 223 778 6614 TTY (952) 868-5881) to talk to a trained counselor call 1-800-273-TALK (toll free, 24 hour hotline) call the [facility]: (403) [phone] call 911   Patient verbalizes understanding of instructions and care plan provided today and agrees to view in MyChart. Active MyChart status and patient understanding of how to access instructions and care plan via MyChart confirmed with patient.     The patient has been provided with contact information for the care management team and has been advised to call with any health related questions or concerns.   Elnita Surprenant L. [name], RN, BSN, CCM [facility] Care Management Community Coordinator Office number [phone]

## 2022-12-16 NOTE — Patient Outreach (Addendum)
  Care Coordination   Initial Visit Note   12/16/2022 Name: Tammie Martin MRN: [id number] DOB: 11-10-1950  Tammie Martin is a 72 y.o. year old female who sees Raliegh Ip, DO for primary care. I spoke with  Tammie Martin by phone today.  What matters to the patients health and wellness today?  Patient discussed ongoing/daily suicidal thoughts due to challenges dealing with multiple medical conditions. Patient staying with her sister, due to current conflicts with her daughter who is her primary caregiver. Patient confirms that she is not physically abusive but yells at her at times. Patient has plans to move in with her daughter who lives in [place] but would like to be better mentally before she goes. Patient states that she needs a inpatient behavioral health stay. Mobile crisis contacted and they will visit patient to assess for needs. Patient 's sister present for support. Patient confirms having no access to weapons   Goals Addressed             This Visit's Progress    "I want to go somewhere inpatient to get better"       Care Coordination Interventions: Confirmed that patient has daily thoughts of suicide-mobile crisis contacted for evaluation for possible inpatient stay           SDOH assessments and interventions completed:  Yes  SDOH Interventions Today    Flowsheet Row Most Recent Value  SDOH Interventions   Food Insecurity Interventions Intervention Not Indicated  Housing Interventions Intervention Not Indicated  [patient plans to move in her other daughter in [place]]  Transportation Interventions Intervention Not Indicated  Depression Interventions/Treatment  --  [Mobile crisis contacted]        Care Coordination Interventions:  Yes, provided  Interventions Today    Flowsheet Row Most Recent Value  Chronic Disease   Chronic disease during today's visit Atrial Fibrillation (AFib)  Mental Health Interventions   Mental Health  Discussed/Reviewed Mental Health Discussed, Suicide  [mobile crisis contacted due to daily thoughts of suicide, patient has passive plan(not taking medications, not eating or drinking "states that she is tired to the bone" Patient wants to be admitted to a inpatient facility]  Safety Interventions   Safety Discussed/Reviewed Safety Discussed  [Mobile crisis contacted]       Follow up plan: Follow up call scheduled for 12/16/22    Encounter Outcome:  Pt. Visit Completed

## 2022-12-16 NOTE — Patient Outreach (Signed)
  Care Coordination   In coming call/SW collabortion  Visit Note   12/16/2022 Name: Tammie Martin MRN: [id number] DOB: 1951/03/14  Tammie Martin is a 72 y.o. year old female who sees Tammie Ip, DO for primary care. I spoke with  Tammie Martin by phone today.  What matters to the patients health and wellness today?  "I want to be committed before I change my mind"  She reports not wanting to take her medications, eat or "do anything"  Confirms she has been thinking about harming herself but no plan Still with her sister Tammie Martin today who is present Patient able to speak with RN CM calmly until coordination of [facility] SW services Patient voices being very appreciative of [facility] services Warm transfer to [facility] [place] for further assistance   Goals Addressed   None     SDOH assessments and interventions completed:  Yes{THN Tip this will not be part of the note when signed-REQUIRED REPORT FIELD DO NOT DELETE (Optional):27901}     Care Coordination Interventions:  Yes, provided {THN Tip this will not be part of the note when signed-REQUIRED REPORT FIELD DO NOT DELETE (Optional):27901}  Follow up plan: Follow up call scheduled for 12/22/22    Encounter Outcome:  Pt. Visit Completed {THN Tip this will not be part of the note when signed-REQUIRED REPORT FIELD DO NOT DELETE (Optional):27901}  Tammie Martin L. [name], RN, BSN, CCM [facility] Care Management Community Coordinator Office number [phone]

## 2022-12-19 ENCOUNTER — Encounter: Payer: Self-pay | Admitting: *Deleted

## 2022-12-19 ENCOUNTER — Ambulatory Visit: Payer: Self-pay | Admitting: *Deleted

## 2022-12-20 NOTE — Patient Outreach (Signed)
Care Coordination   Initial Visit Note   12/20/2022  Name: Tammie Martin MRN: [id number] DOB: [birth year]  Tammie Martin is a 72 y.o. year old female who sees Tammie Ip, DO for primary care. I spoke with Tammie Martin by phone today.  What matters to the patients health and wellness today?  Receive Counseling and Supportive Services for Symptoms of Anxiety and Depression.   Goals Addressed               This Visit's Progress     Receive Counseling and Supportive Services for Symptoms of Anxiety and Depression. (pt-stated)   On track     Care Coordination Interventions:   Interventions Today    Flowsheet Row Most Recent Value  Chronic Disease   Chronic disease during today's visit Hypertension (HTN), Chronic Obstructive Pulmonary Disease (COPD), Other  [Tobacco Abuse, Generalized Anxiety Disorder, Family Discord, Depression, Seizure Disorder & History of Cervical Cancer]  General Interventions   General Interventions Discussed/Reviewed General Interventions Discussed, Labs, Vaccines, Health Screening, [employer], Level of Care, Communication with, Doctor Visits, General Interventions Reviewed, Annual Eye Exam, Horticulturist, commercial (DME)  [Communication with Primary Care Provider]  Labs Hgb A1c annually  [Encouraged]  Vaccines COVID-19, Flu, Pneumonia, RSV, Shingles, Tetanus/Pertussis/Diphtheria  [Encouraged]  Doctor Visits Discussed/Reviewed Doctor Visits Discussed, Doctor Visits Reviewed, Annual Wellness Visits, PCP, Specialist  [Encouraged]  Health Screening Bone Density, Colonoscopy, Mammogram  [Encouraged]  Durable Medical Equipment (DME) Other  [Cane & Eyeglasses]  PCP/Specialist Visits Compliance with follow-up visit  [Encouraged]  Communication with PCP/Specialists, RN  Level of Care Adult Daycare, Applications, Assisted Living, Skilled Nursing Facility, Personal Care Services  [Encouraged]  Applications Medicaid, Personal Care Services,  FL-2  [Encouraged]  Exercise Interventions   Exercise Discussed/Reviewed Exercise Discussed, Assistive device use and maintanence, Physical Activity, Weight Managment, Exercise Reviewed  [Encouraged]  Physical Activity Discussed/Reviewed Physical Activity Discussed, Home Exercise Program (HEP), Physical Activity Reviewed, Types of exercise  [Encouraged]  Weight Management Weight maintenance  [Encouraged]  Education Interventions   Education Provided Provided Therapist, sports, Provided Web-based Education, Provided Education  Provided Verbal Education On Nutrition, Mental Health/Coping with Illness, When to see the doctor, [employer], [employer], Medication, Exercise, Applications, Eye Care  [employer], Personal Care Services, FL-2  [Encouraged]  Mental Health Interventions   Mental Health Discussed/Reviewed Mental Health Discussed, Anxiety, Depression, Grief and Loss, Mental Health Reviewed, Coping Strategies, Substance Abuse, Suicide, Crisis, Other  [Domestic Violence & Family Discord]  Refer to Social Work for counseling regarding Anxiety/Coping, Depression  Refer to Social Work for resources regarding Assisted Living/Skilled Nursing Facility, Crisis, Mental Health, Adult Daycare, Substance Abuse  Nutrition Interventions   Nutrition Discussed/Reviewed Nutrition Discussed, Nutrition Reviewed, Carbohydrate meal planning, Supplmental nutrition, Decreasing salt, Portion sizes, Decreasing sugar intake, Fluid intake, Decreasing fats, Increaing proteins, Adding fruits and vegetables  [Encouraged]  Pharmacy Interventions   Pharmacy Dicussed/Reviewed Pharmacy Topics Discussed, Medication Adherence, Affording Medications, Pharmacy Topics Reviewed  [Encouraged]  Safety Interventions   Safety Discussed/Reviewed Safety Discussed, Safety Reviewed, Fall Risk, Home Safety  [Encouraged]  Home Safety Assistive Devices, Need for home safety assessment, Refer for community  resources  [Encouraged]  Advanced Directive Interventions   Advanced Directives Discussed/Reviewed Advanced Directives Discussed  [Encouraged Completion]     Assessed Social Determinant of Health Barriers. Discussed Plans for Ongoing Care Management Follow Up. Provided Careers information officer Information for Care Management Team Members. Screened for Signs & Symptoms of Depression, Related to Chronic Disease State.  PHQ2 & PHQ9 Depression Screen Completed & Results Reviewed.  Suicidal Ideation & Homicidal Ideation Assessed - None Present.   Domestic Violence Assessed - None Present. Access to Weapons Assessed - None Present.   Active Listening & Reflection Utilized.  Verbalization of Feelings Encouraged.  Emotional Support Provided. Feelings of Caregiver Burnout Validated. Caregiver Stress Acknowledged. Caregiver Resources Reviewed. Caregiver Support Groups Mailed. Self-Enrollment in Caregiver Support Group of Interest Emphasized. Crisis Support Information, Agencies, Services & Resources Discussed. Problem Solving Interventions Identified. Task-Centered Solutions Implemented.   Solution-Focused Strategies Developed. Acceptance & Commitment Therapy Introduced. Brief Cognitive Behavioral Therapy Initiated. Client-Centered Therapy Enacted. Reviewed Prescription Medications & Discussed Importance of Compliance. Quality of Sleep Assessed & Sleep Hygiene Techniques Promoted. Encouraged Referral to [facility] for Treatment of Family Discord. Encouraged Referral to Psychiatrist for Psychotropic Medication Management. Encouraged Referral to Therapist for Psychotherapeutic Counseling & Supportive Services. Encouraged Contact with The Domestic Violence Hotline (# 801-868-3825) If You Feel Threatened, or That Your Life Is In Danger Chief Executive Officer Emailed on 12/19/2022). Emphasized Importance of Quitting Smoking & Offered Smoking Cessation Classes, Services, Agencies & Resources. Discussed Higher  Level of Care Options (I.e. Assisted Living Facility, Extended Care Facility, Rest Home, Family Care Home, Group Home, Etc.) & Encouraged Consideration. Verified No In-Home Care Services, [employer], Warden/ranger, Etc., Covered Under Firefighter through [employer].    Reviewed Materials engineer through [facility] & Encouraged Completion of Application for Medicaid & Submission to [facility] of Social Services [phone]), for Processing. Verified No Long-Term Care Insurance Benefits, Secondary Insurance Policies, Plans, Coverage, Etc.  Confirmed You, Nor Deceased Husband Were Veterans, Making You Ineligible to Apply for Aid & Attendance Benefits, Through [employer]. Continue to Receive Care & Supervision from 4-Year-Old Granddaughter, 54-Year-Old Granddaughter, 64-Year-Old Goddaughter & 62-Year-Old Daughter. Continue to Reside with 61-Year-Old Daughter, Refraining from Any Interaction or Involvement with 38-Year-Old Daughter, Currently Suffering from Untreated Mental Illness & Residing in Your Home. Continue to Receive 2 Hot Meals Per Day, Breakfast & Dinner, 7 Days Per Week, through [employer], Drinking Ensure Shakes for [employer]. Continue to Receive Case Management Services through Adult Protective Services Case Worker, with [facility] of Social Services [phone]). Continue to Receive Weekly Home Visits from Adult Protective Services Case Worker, with [facility] of Social Services [phone]), in An Effort to [employer] for 17-Year-Old Daughter, Currently Suffering from Untreated Mental Illness & Residing in Your Home. Contact CSW Directly (# [phone]), if You Have Questions, Need Assistance, or If Additional Social Work Needs Are Identified Between Now & Our Next Scheduled Follow-Up Outreach Call.        SDOH  assessments and interventions completed:  Yes.  SDOH Interventions Today    Flowsheet Row Most Recent Value  SDOH Interventions   Food Insecurity Interventions Intervention Not Indicated  Housing Interventions Intervention Not Indicated  Transportation Interventions Intervention Not Indicated, Patient Resources (Friends/Family)  Utilities Interventions Intervention Not Indicated  Alcohol Usage Interventions Intervention Not Indicated (Score <7)  Depression Interventions/Treatment  Referral to Psychiatry, Medication, Counseling  Financial Strain Interventions Intervention Not Indicated  Physical Activity Interventions Patient Refused  Stress Interventions Offered [employer] Resources, Provide Counseling  [Provided Counseling Agencies, Services & Resources]  Social Connections Interventions Intervention Not Indicated     Care Coordination Interventions:  Yes, provided.   Follow up plan: Follow up call scheduled for 12/22/2022 at 3:00 pm.  Encounter Outcome:  Pt. Visit Completed.   Danford Bad, BSW, MSW, LCSW  Licensed Restaurant manager, fast food [facility]  Mailing [place] [address] [place], [place] [address] Physical Address-[address], [place], [place] [address] Toll Free Main # [phone] Fax # [phone] Cell # [phone] [email or web address]

## 2022-12-20 NOTE — Patient Instructions (Signed)
Visit Information  Thank you for taking time to visit with me today. Please don't hesitate to contact me if I can be of assistance to you.   Following are the goals we discussed today:   Goals Addressed               This Visit's Progress     Receive Counseling and Supportive Services for Symptoms of Anxiety and Depression. (pt-stated)   On track     Care Coordination Interventions:   Interventions Today    Flowsheet Row Most Recent Value  Chronic Disease   Chronic disease during today's visit Hypertension (HTN), Chronic Obstructive Pulmonary Disease (COPD), Other  [Tobacco Abuse, Generalized Anxiety Disorder, Family Discord, Depression, Seizure Disorder & History of Cervical Cancer]  General Interventions   General Interventions Discussed/Reviewed General Interventions Discussed, Labs, Vaccines, Health Screening, [employer], Level of Care, Communication with, Doctor Visits, General Interventions Reviewed, Annual Eye Exam, Horticulturist, commercial (DME)  [Communication with Primary Care Provider]  Labs Hgb A1c annually  [Encouraged]  Vaccines COVID-19, Flu, Pneumonia, RSV, Shingles, Tetanus/Pertussis/Diphtheria  [Encouraged]  Doctor Visits Discussed/Reviewed Doctor Visits Discussed, Doctor Visits Reviewed, Annual Wellness Visits, PCP, Specialist  [Encouraged]  Health Screening Bone Density, Colonoscopy, Mammogram  [Encouraged]  Durable Medical Equipment (DME) Other  [Cane & Eyeglasses]  PCP/Specialist Visits Compliance with follow-up visit  [Encouraged]  Communication with PCP/Specialists, RN  Level of Care Adult Daycare, Applications, Assisted Living, Skilled Nursing Facility, Personal Care Services  [Encouraged]  Applications Medicaid, Personal Care Services, FL-2  [Encouraged]  Exercise Interventions   Exercise Discussed/Reviewed Exercise Discussed, Assistive device use and maintanence, Physical Activity, Weight Managment, Exercise Reviewed  [Encouraged]  Physical  Activity Discussed/Reviewed Physical Activity Discussed, Home Exercise Program (HEP), Physical Activity Reviewed, Types of exercise  [Encouraged]  Weight Management Weight maintenance  [Encouraged]  Education Interventions   Education Provided Provided Therapist, sports, Provided Web-based Education, Provided Education  Provided Verbal Education On Nutrition, Mental Health/Coping with Illness, When to see the doctor, [employer], [employer], Medication, Exercise, Applications, Eye Care  [employer], Personal Care Services, FL-2  [Encouraged]  Mental Health Interventions   Mental Health Discussed/Reviewed Mental Health Discussed, Anxiety, Depression, Grief and Loss, Mental Health Reviewed, Coping Strategies, Substance Abuse, Suicide, Crisis, Other  [Domestic Violence & Family Discord]  Refer to Social Work for counseling regarding Anxiety/Coping, Depression  Refer to Social Work for resources regarding Assisted Living/Skilled Nursing Facility, Crisis, Mental Health, Adult Daycare, Substance Abuse  Nutrition Interventions   Nutrition Discussed/Reviewed Nutrition Discussed, Nutrition Reviewed, Carbohydrate meal planning, Supplmental nutrition, Decreasing salt, Portion sizes, Decreasing sugar intake, Fluid intake, Decreasing fats, Increaing proteins, Adding fruits and vegetables  [Encouraged]  Pharmacy Interventions   Pharmacy Dicussed/Reviewed Pharmacy Topics Discussed, Medication Adherence, Affording Medications, Pharmacy Topics Reviewed  [Encouraged]  Safety Interventions   Safety Discussed/Reviewed Safety Discussed, Safety Reviewed, Fall Risk, Home Safety  [Encouraged]  Home Safety Assistive Devices, Need for home safety assessment, Refer for community resources  [Encouraged]  Advanced Directive Interventions   Advanced Directives Discussed/Reviewed Advanced Directives Discussed  [Encouraged Completion]     Assessed Social Determinant of Health  Barriers. Discussed Plans for Ongoing Care Management Follow Up. Provided Careers information officer Information for Care Management Team Members. Screened for Signs & Symptoms of Depression, Related to Chronic Disease State.  PHQ2 & PHQ9 Depression Screen Completed & Results Reviewed.  Suicidal Ideation & Homicidal Ideation Assessed - None Present.   Domestic Violence Assessed - None Present. Access to Weapons Assessed - None  Present.   Active Listening & Reflection Utilized.  Verbalization of Feelings Encouraged.  Emotional Support Provided. Feelings of Caregiver Burnout Validated. Caregiver Stress Acknowledged. Caregiver Resources Reviewed. Caregiver Support Groups Mailed. Self-Enrollment in Caregiver Support Group of Interest Emphasized. Crisis Support Information, Agencies, Services & Resources Discussed. Problem Solving Interventions Identified. Task-Centered Solutions Implemented.   Solution-Focused Strategies Developed. Acceptance & Commitment Therapy Introduced. Brief Cognitive Behavioral Therapy Initiated. Client-Centered Therapy Enacted. Reviewed Prescription Medications & Discussed Importance of Compliance. Quality of Sleep Assessed & Sleep Hygiene Techniques Promoted. Encouraged Referral to [facility] for Treatment of Family Discord. Encouraged Referral to Psychiatrist for Psychotropic Medication Management. Encouraged Referral to Therapist for Psychotherapeutic Counseling & Supportive Services. Encouraged Contact with The Domestic Violence Hotline (# 954-001-8984) If You Feel Threatened, or That Your Life Is In Danger Chief Executive Officer Emailed on 12/19/2022). Emphasized Importance of Quitting Smoking & Offered Smoking Cessation Classes, Services, Agencies & Resources. Discussed Higher Level of Care Options (I.e. Assisted Living Facility, Extended Care Facility, Rest Home, Family Care Home, Group Home, Etc.) & Encouraged Consideration. Verified No In-Home Care Services, [employer], Warden/ranger, Etc., Covered Under Firefighter through [employer].    Reviewed Materials engineer through [facility] & Encouraged Completion of Application for Medicaid & Submission to [facility] of Social Services [phone]), for Processing. Verified No Long-Term Care Insurance Benefits, Secondary Insurance Policies, Plans, Coverage, Etc.  Confirmed You, Nor Deceased Husband Were Veterans, Making You Ineligible to Apply for Aid & Attendance Benefits, Through [employer]. Continue to Receive Care & Supervision from 69-Year-Old Granddaughter, 30-Year-Old Granddaughter, 60-Year-Old Goddaughter & 51-Year-Old Daughter. Continue to Reside with 32-Year-Old Daughter, Refraining from Any Interaction or Involvement with 3-Year-Old Daughter, Currently Suffering from Untreated Mental Illness & Residing in Your Home. Continue to Receive 2 Hot Meals Per Day, Breakfast & Dinner, 7 Days Per Week, through [employer], Drinking Ensure Shakes for [employer]. Continue to Receive Case Management Services through Adult Protective Services Case Worker, with [facility] of Social Services [phone]). Continue to Receive Weekly Home Visits from Adult Protective Services Case Worker, with [facility] of Social Services [phone]), in An Effort to [employer] for 4-Year-Old Daughter, Currently Suffering from Untreated Mental Illness & Residing in Your Home. Contact CSW Directly (# [phone]), if You Have Questions, Need Assistance, or If Additional Social Work Needs Are Identified Between Now & Our Next Scheduled Follow-Up Outreach Call.      Our next appointment is by telephone on 12/22/2022 at 3:00 pm.  Please call the care guide team at [phone] if you need to cancel or reschedule your appointment.   If you are experiencing a Mental Health or  Behavioral Health Crisis or need someone to talk to, please call the Suicide and Crisis Lifeline: 988 call the [place] National Suicide Prevention Lifeline: 7745777705 or TTY: 539-610-4531 TTY 478-673-1390) to talk to a trained counselor call 1-800-273-TALK (toll free, 24 hour hotline) go to [facility] [address], [place] [phone]) call the [facility] Crisis Line: ([phone]) [phone] call 911  Patient verbalizes understanding of instructions and care plan provided today and agrees to view in MyChart. Active MyChart status and patient understanding of how to access instructions and care plan via MyChart confirmed with patient.     Telephone follow up appointment with care management team member scheduled for:  12/22/2022 at 3:00 pm.    Danford Bad, BSW, MSW, LCSW  Licensed Clinical Social Financial planner [facility]  Mailing [place] [address] [place], [place] [address] Physical Address[address], [place], [place] [address] Toll Free Main # [phone] Fax # [phone] Cell # [phone] [email or web address]

## 2022-12-21 ENCOUNTER — Other Ambulatory Visit: Payer: Self-pay | Admitting: Neurology

## 2022-12-21 NOTE — Telephone Encounter (Signed)
Dr. Teresa Coombs,  Is the pt supposed to take keppra and lamotrigine? Keppra was dispensed at the pharmacy today. In your  last note you stated:  I will switch her from Keppra to lamotrigine. Titration given to patient. In 6 weeks she will present for lab work at that time we will can discontinue the Keppra and continue her on lamotrigine 100 mg twice daily.  Orders placed: Medication Changes   lamoTRIgine 25 MG Start with Lamotrigine  (one pill) at night for one week, then  (one pill) twice daily for one week, then  (one pill) morning and  (two pills) night for one week, then  (two pills) twice daily for one week then  (three pills) twice daily for one week then finally  (fours pills) twice daily

## 2022-12-21 NOTE — Telephone Encounter (Signed)
Yes she was supposed to take both Keppra and Lamotrigine until I see her in 6 weeks and at that time, we will stop the Keppra

## 2022-12-22 ENCOUNTER — Encounter: Payer: Self-pay | Admitting: *Deleted

## 2022-12-22 ENCOUNTER — Telehealth: Payer: Self-pay | Admitting: Family Medicine

## 2022-12-22 ENCOUNTER — Ambulatory Visit: Payer: Self-pay | Admitting: *Deleted

## 2022-12-22 NOTE — Patient Outreach (Signed)
  Care Coordination   Follow Up Visit Note   11/16/2023 updated note for 12/22/22 Name: Tammie Martin MRN: [id number] DOB: 1951/04/11  Tammie Martin is a 72 y.o. year old female who sees Tammie Ip, DO for primary care. I spoke with  Tammie Martin by phone today.  What matters to the patients health and wellness today?  Need pain medicine  Can't  Tylenol & Ibuprofen related to Oxycodone Had 10 oxycodone tabs from ED  Cream, is not working  Need Gi - still bleeding too much, oozy stool & blood, low energy Take 1 tab Eliquis twice but wonder if this is too much Memory not good per patient  Tammie Martin (DM,)  leaving her apartment soon Pt want to get pill packing Tammie Martin does it  Goals Addressed             This Visit's Progress    [facility] care coordination services       Interventions Today    Flowsheet Row Most Recent Value  Chronic Disease   Chronic disease during today's visit Chronic Obstructive Pulmonary Disease (COPD), Hypertension (HTN), Other  [pain, GI symptoms]  General Interventions   General Interventions Discussed/Reviewed General Interventions Reviewed, Doctor Visits, Community Resources  Doctor Visits Discussed/Reviewed Doctor Visits Reviewed, PCP  PCP/Specialist Visits Compliance with follow-up visit  Education Interventions   Education Provided Provided Education  Provided Verbal Education On [employer], Other, Sick Day Rules  Mental Health Interventions   Mental Health Discussed/Reviewed Mental Health Reviewed, Coping Strategies  Nutrition Interventions   Nutrition Discussed/Reviewed Nutrition Reviewed, Fluid intake  Martin Interventions   Martin Dicussed/Reviewed Martin Topics Reviewed, Medications and their functions, Affording Medications              SDOH assessments and interventions completed:  No     Care Coordination Interventions:  Yes, provided   Follow up plan: No further intervention required.    Encounter Outcome:  Pt. Visit Completed   Tammie Martin L. [name], RN, BSN, CCM [facility] Care Management Community Coordinator Office number [phone]

## 2022-12-22 NOTE — Patient Instructions (Signed)
Visit Information  Thank you for taking time to visit with me today. Please don't hesitate to contact me if I can be of assistance to you.   Following are the goals we discussed today:   Goals Addressed               This Visit's Progress     Receive Counseling and Supportive Services for Symptoms of Anxiety and Depression. (pt-stated)   On track     Care Coordination Interventions:   Interventions Today    Flowsheet Row Most Recent Value  Chronic Disease   Chronic disease during today's visit Hypertension (HTN), Chronic Obstructive Pulmonary Disease (COPD), Other  [Tobacco Abuse, Generalized Anxiety Disorder, Family Discord, Depression, Seizure Disorder & History of Cervical Cancer]  General Interventions   General Interventions Discussed/Reviewed General Interventions Discussed, Labs, Vaccines, Health Screening, [employer], Level of Care, Communication with, Doctor Visits, General Interventions Reviewed, Annual Eye Exam, Horticulturist, commercial (DME)  [Communication with Primary Care Provider]  Labs Hgb A1c annually  [Encouraged]  Vaccines COVID-19, Flu, Pneumonia, RSV, Shingles, Tetanus/Pertussis/Diphtheria  [Encouraged]  Doctor Visits Discussed/Reviewed Doctor Visits Discussed, Doctor Visits Reviewed, Annual Wellness Visits, PCP, Specialist  [Encouraged]  Health Screening Bone Density, Colonoscopy, Mammogram  [Encouraged]  Durable Medical Equipment (DME) Other  [Cane & Eyeglasses]  PCP/Specialist Visits Compliance with follow-up visit  [Encouraged]  Communication with PCP/Specialists, RN  Level of Care Adult Daycare, Applications, Assisted Living, Skilled Nursing Facility, Personal Care Services  [Encouraged]  Applications Medicaid, Personal Care Services, FL-2  [Encouraged]  Exercise Interventions   Exercise Discussed/Reviewed Exercise Discussed, Assistive device use and maintanence, Physical Activity, Weight Managment, Exercise Reviewed  [Encouraged]  Physical  Activity Discussed/Reviewed Physical Activity Discussed, Home Exercise Program (HEP), Physical Activity Reviewed, Types of exercise  [Encouraged]  Weight Management Weight maintenance  [Encouraged]  Education Interventions   Education Provided Provided Therapist, sports, Provided Web-based Education, Provided Education  Provided Verbal Education On Nutrition, Mental Health/Coping with Illness, When to see the doctor, [employer], [employer], Medication, Exercise, Applications, Eye Care  [employer], Personal Care Services, FL-2  [Encouraged]  Mental Health Interventions   Mental Health Discussed/Reviewed Mental Health Discussed, Anxiety, Depression, Grief and Loss, Mental Health Reviewed, Coping Strategies, Substance Abuse, Suicide, Crisis, Other  [Domestic Violence & Family Discord]  Refer to Social Work for counseling regarding Anxiety/Coping, Depression  Refer to Social Work for resources regarding Assisted Living/Skilled Nursing Facility, Crisis, Mental Health, Adult Daycare, Substance Abuse  Nutrition Interventions   Nutrition Discussed/Reviewed Nutrition Discussed, Nutrition Reviewed, Carbohydrate meal planning, Supplmental nutrition, Decreasing salt, Portion sizes, Decreasing sugar intake, Fluid intake, Decreasing fats, Increaing proteins, Adding fruits and vegetables  [Encouraged]  Pharmacy Interventions   Pharmacy Dicussed/Reviewed Pharmacy Topics Discussed, Medication Adherence, Affording Medications, Pharmacy Topics Reviewed  [Encouraged]  Safety Interventions   Safety Discussed/Reviewed Safety Discussed, Safety Reviewed, Fall Risk, Home Safety  [Encouraged]  Home Safety Assistive Devices, Need for home safety assessment, Refer for community resources  [Encouraged]  Advanced Directive Interventions   Advanced Directives Discussed/Reviewed Advanced Directives Discussed  [Encouraged Completion]     Active Listening & Reflection Utilized.   Verbalization of Feelings Encouraged.  Emotional Support Provided. Feelings of Caregiver Burnout Validated. Caregiver Stress Acknowledged. Caregiver Resources Reviewed. Caregiver Support Groups Mailed. Self-Enrollment in Caregiver Support Group of Interest Emphasized. Crisis Support Information, Agencies, Services & Resources Revisited. Problem Solving Interventions Activated. Task-Centered Solutions Employed.   Solution-Focused Strategies Performed. Acceptance & Commitment Therapy Indicated. Brief Cognitive Behavioral Therapy Initiated. Client-Centered Therapy  Enacted. Referral to [facility] for Treatment of Family Discord Revisited. Referral to Psychiatrist for Psychotropic Medication Management Discussed & Encouraged. Referral to Therapist for Psychotherapeutic Counseling & Supportive Services Reviewed & Encouraged. Please Consider Allowing CSW to Provide Counseling Services, Agencies & Resources & Assist with Referral Process. Please Review [employer] cabin crew [phone]) & Contact Immediately, If You Feel Threatened, or That Your Life Is In Danger. Please Consider Allowing CSW to Provide Smoking Cessation Classes, Services, Agencies & Resources & Assist with Referral Process. Continue to Receive Care & Supervision from 41-Year-Old Granddaughter, 32-Year-Old Granddaughter, 77-Year-Old Goddaughter & 6-Year-Old Daughter. Continue to Reside with 45-Year-Old Daughter, Until Permanent, Safe & Affordable Housing is Obtained for 62-Year-Old Daughter, Currently Suffering from Untreated Mental Illness & Residing in Your Home. CSW Collaboration with Health and safety inspector at [employer], [address], [place], [place] [address] [phone]), to Confirm Safety, Affordability & Availability of 1 Bedroom Apartment. Please Have 6-Year-Old Daughter, with Whom You Currently Reside, Contact 81-Year-Old Daughter, Currently Residing in Your Home, Leisure centre manager at [employer], [address] [place], [place] [address] [phone]), to Discuss Residency in 1 Bedroom Apartment. Congratulations on Convincing 49-Year-Old Daughter, Currently Residing in Your Home, to [employer] for 1 Bedroom Apartment at [employer], [address], [place], [place] [address] [phone]), on 12/22/2022. CSW Collaboration with Health and safety inspector at [employer], [address], [place], [place] [address] [phone]), to [employer] of Application for 1 Bedroom Apartment by 14-Year-Old Daughter, on 12/22/2022. CSW Collaboration with Health and safety inspector at [employer], [address], [place], [place] [address] [phone]), to Confirm 3-5 Business Day Turn-Around Time for Application Processing, But Voiced Urgency of Expedited Application Processing.   Please Contact CSW Directly (# [phone]), if You Have Questions, Need Assistance, or If Additional Social Work Needs Are Identified Between Now & Our Next Scheduled Follow-Up Outreach Call.      Our next appointment is by telephone on 12/29/2022 at 1:45 pm.   Please call the care guide team at [phone] if you need to cancel or reschedule your appointment.   If you are experiencing a Mental Health or Behavioral Health Crisis or need someone to talk to, please call the Suicide and Crisis Lifeline: 988 call the [place] National Suicide Prevention Lifeline: 308-271-4548 or TTY: 502-410-0854 TTY 671-541-6285) to talk to a trained counselor call 1-800-273-TALK (toll free, 24 hour hotline) go to [facility] [address], [place] [phone]) call the [facility] Crisis Line: [phone] call 911  Patient verbalizes understanding of instructions and care plan provided today and agrees to view in MyChart. Active MyChart status and patient understanding of how to access instructions  and care plan via MyChart confirmed with patient.     Telephone follow up appointment with care management team member scheduled for:  12/29/2022 at 1:45 pm.   Danford Bad, BSW, MSW, LCSW  Licensed Clinical Social Worker  [facility]  Mailing [place]. [address], [place], [place] [address] Physical Address[address], [place], [place] [address] Toll Free Main # [phone] Fax # [phone] Cell # [phone] [email or web address]

## 2022-12-22 NOTE — Patient Outreach (Signed)
Care Coordination   Follow Up Visit Note   12/22/2022  Name: Tammie Martin MRN: [id number] DOB: Mar 24, 1951  Tammie Martin is a 72 y.o. year old female who sees Tammie Ip, DO for primary care. I spoke with Tammie Martin by phone today.  What matters to the patients health and wellness today?  Receive Counseling and Supportive Services for Symptoms of Anxiety and Depression.   Goals Addressed               This Visit's Progress     Receive Counseling and Supportive Services for Symptoms of Anxiety and Depression. (pt-stated)   On track     Care Coordination Interventions:   Interventions Today    Flowsheet Row Most Recent Value  Chronic Disease   Chronic disease during today's visit Hypertension (HTN), Chronic Obstructive Pulmonary Disease (COPD), Other  [Tobacco Abuse, Generalized Anxiety Disorder, Family Discord, Depression, Seizure Disorder & History of Cervical Cancer]  General Interventions   General Interventions Discussed/Reviewed General Interventions Discussed, Labs, Vaccines, Health Screening, [facility], Level of Care, Communication with, Doctor Visits, General Interventions Reviewed, Annual Eye Exam, Horticulturist, commercial (DME)  [Communication with Primary Care Provider]  Labs Hgb A1c annually  [Encouraged]  Vaccines COVID-19, Flu, Pneumonia, RSV, Shingles, Tetanus/Pertussis/Diphtheria  [Encouraged]  Doctor Visits Discussed/Reviewed Doctor Visits Discussed, Doctor Visits Reviewed, Annual Wellness Visits, PCP, Specialist  [Encouraged]  Health Screening Bone Density, Colonoscopy, Mammogram  [Encouraged]  Durable Medical Equipment (DME) Other  [Cane & Eyeglasses]  PCP/Specialist Visits Compliance with follow-up visit  [Encouraged]  Communication with PCP/Specialists, RN  Level of Care Adult Daycare, Applications, Assisted Living, Skilled Nursing Facility, Personal Care Services  [Encouraged]  Applications Medicaid, Personal Care  Services, [place]-2  [Encouraged]  Exercise Interventions   Exercise Discussed/Reviewed Exercise Discussed, Assistive device use and maintanence, Physical Activity, Weight Managment, Exercise Reviewed  [Encouraged]  Physical Activity Discussed/Reviewed Physical Activity Discussed, Home Exercise Program (HEP), Physical Activity Reviewed, Types of exercise  [Encouraged]  Weight Management Weight maintenance  [Encouraged]  Education Interventions   Education Provided Provided Therapist, sports, Provided Web-based Education, Provided Education  Provided Verbal Education On Nutrition, Mental Health/Coping with Illness, When to see the doctor, [employer], [employer], Medication, Exercise, Applications, Eye Care  [employer], Personal Care Services, FL-2  [Encouraged]  Mental Health Interventions   Mental Health Discussed/Reviewed Mental Health Discussed, Anxiety, Depression, Grief and Loss, Mental Health Reviewed, Coping Strategies, Substance Abuse, Suicide, Crisis, Other  [Domestic Violence & Family Discord]  Refer to Social Work for counseling regarding Anxiety/Coping, Depression  Refer to Social Work for resources regarding Assisted Living/Skilled Nursing Facility, Crisis, Mental Health, Adult Daycare, Substance Abuse  Nutrition Interventions   Nutrition Discussed/Reviewed Nutrition Discussed, Nutrition Reviewed, Carbohydrate meal planning, Supplmental nutrition, Decreasing salt, Portion sizes, Decreasing sugar intake, Fluid intake, Decreasing fats, Increaing proteins, Adding fruits and vegetables  [Encouraged]  Pharmacy Interventions   Pharmacy Dicussed/Reviewed Pharmacy Topics Discussed, Medication Adherence, Affording Medications, Pharmacy Topics Reviewed  [Encouraged]  Safety Interventions   Safety Discussed/Reviewed Safety Discussed, Safety Reviewed, Fall Risk, Home Safety  [Encouraged]  Home Safety Assistive Devices, Need for home safety assessment, Refer for  community resources  [Encouraged]  Advanced Directive Interventions   Advanced Directives Discussed/Reviewed Advanced Directives Discussed  [Encouraged Completion]     Active Listening & Reflection Utilized.  Verbalization of Feelings Encouraged.  Emotional Support Provided. Feelings of Caregiver Burnout Validated. Caregiver Stress Acknowledged. Caregiver Resources Reviewed. Caregiver Support Groups Mailed. Self-Enrollment in Caregiver Support Group  of Interest Emphasized. Crisis Support Information, Agencies, Services & Resources Revisited. Problem Solving Interventions Activated. Task-Centered Solutions Employed.   Solution-Focused Strategies Performed. Acceptance & Commitment Therapy Indicated. Brief Cognitive Behavioral Therapy Initiated. Client-Centered Therapy Enacted. Referral to [facility] for Treatment of Family Discord Revisited. Referral to Psychiatrist for Psychotropic Medication Management Discussed & Encouraged. Referral to Therapist for Psychotherapeutic Counseling & Supportive Services Reviewed & Encouraged. Please Consider Allowing CSW to Provide Counseling Services, Agencies & Resources & Assist with Referral Process. Please Review [employer] cabin crew [phone]) & Contact Immediately, If You Feel Threatened, or That Your Life Is In Danger. Please Consider Allowing CSW to Provide Smoking Cessation Classes, Services, Agencies & Resources & Assist with Referral Process. Continue to Receive Care & Supervision from 70-Year-Old Granddaughter, 81-Year-Old Granddaughter, 29-Year-Old Goddaughter & 60-Year-Old Daughter. Continue to Reside with 54-Year-Old Daughter, Until Permanent, Safe & Affordable Housing is Obtained for 4-Year-Old Daughter, Currently Suffering from Untreated Mental Illness & Residing in Your Home. CSW Collaboration with Health and safety inspector at [employer], [address] [place], [place] [address] [phone]), to  Confirm Safety, Affordability & Availability of 1 Bedroom Apartment. Please Have 56-Year-Old Daughter, with Whom You Currently Reside, Contact 46-Year-Old Daughter, Currently Residing in Your Home, Copy at [employer], [address] [place], [place] [address] [phone]), to Discuss Residency in 1 Bedroom Apartment. Congratulations on Convincing 7-Year-Old Daughter, Currently Residing in Your Home, to [employer] for 1 Bedroom Apartment at [employer], [address], [place], [place] [address] [phone]), on 12/22/2022. CSW Collaboration with Health and safety inspector at [employer], [address], [place], [place] [address] [phone]), to [employer] of Application for 1 Bedroom Apartment by 66-Year-Old Daughter, on 12/22/2022. CSW Collaboration with Health and safety inspector at [employer], [address], [place], [place] [address] [phone]), to Confirm 3-5 Business Day Turn-Around Time for Application Processing, But Voiced Urgency of Expedited Application Processing.   Please Contact CSW Directly (# [phone]), if You Have Questions, Need Assistance, or If Additional Social Work Needs Are Identified Between Now & Our Next Scheduled Follow-Up Outreach Call.      SDOH assessments and interventions completed:  Yes.  Care Coordination Interventions:  Yes, provided.   Follow up plan: Follow up call scheduled for 12/29/2022 at 1:45 pm.   Encounter Outcome:  Pt. Visit Completed.   Danford Bad, BSW, MSW, LCSW  Licensed Restaurant manager, [facility]  Mailing [place] [address]. [address] [place], [place] [address] Physical Address[address] [place], [place] [address] Toll Free Main # [phone] Fax # [phone] Cell # [phone] [email or web address]

## 2022-12-23 NOTE — Telephone Encounter (Signed)
FYI

## 2022-12-28 ENCOUNTER — Ambulatory Visit (INDEPENDENT_AMBULATORY_CARE_PROVIDER_SITE_OTHER): Payer: Medicare HMO

## 2022-12-28 DIAGNOSIS — R569 Unspecified convulsions: Secondary | ICD-10-CM

## 2022-12-28 DIAGNOSIS — I10 Essential (primary) hypertension: Secondary | ICD-10-CM

## 2022-12-28 DIAGNOSIS — E039 Hypothyroidism, unspecified: Secondary | ICD-10-CM

## 2022-12-28 DIAGNOSIS — S72142D Displaced intertrochanteric fracture of left femur, subsequent encounter for closed fracture with routine healing: Secondary | ICD-10-CM | POA: Diagnosis not present

## 2022-12-28 DIAGNOSIS — Z9181 History of falling: Secondary | ICD-10-CM

## 2022-12-28 DIAGNOSIS — Z7901 Long term (current) use of anticoagulants: Secondary | ICD-10-CM | POA: Diagnosis not present

## 2022-12-28 DIAGNOSIS — Z8673 Personal history of transient ischemic attack (TIA), and cerebral infarction without residual deficits: Secondary | ICD-10-CM | POA: Diagnosis not present

## 2022-12-28 DIAGNOSIS — F17210 Nicotine dependence, cigarettes, uncomplicated: Secondary | ICD-10-CM | POA: Diagnosis not present

## 2022-12-28 DIAGNOSIS — I480 Paroxysmal atrial fibrillation: Secondary | ICD-10-CM

## 2022-12-29 ENCOUNTER — Encounter: Payer: Self-pay | Admitting: *Deleted

## 2022-12-29 ENCOUNTER — Ambulatory Visit: Payer: Self-pay | Admitting: *Deleted

## 2022-12-29 NOTE — Patient Instructions (Signed)
Visit Information  Thank you for taking time to visit with me today. Please don't hesitate to contact me if I can be of assistance to you.   Following are the goals we discussed today:   Goals Addressed               This Visit's Progress     Receive Counseling and Supportive Services for Symptoms of Anxiety and Depression. (pt-stated)   On track     Care Coordination Interventions:   Interventions Today    Flowsheet Row Most Recent Value  Chronic Disease   Chronic disease during today's visit Hypertension (HTN), Chronic Obstructive Pulmonary Disease (COPD), Other  [Tobacco Abuse, Generalized Anxiety Disorder, Family Discord, Depression, Seizure Disorder & History of Cervical Cancer]  General Interventions   General Interventions Discussed/Reviewed General Interventions Discussed, Labs, Vaccines, Health Screening, [employer], Level of Care, Communication with, Doctor Visits, General Interventions Reviewed, Annual Eye Exam, Horticulturist, commercial (DME)  [Communication with Primary Care Provider]  Labs Hgb A1c annually  [Encouraged]  Vaccines COVID-19, Flu, Pneumonia, RSV, Shingles, Tetanus/Pertussis/Diphtheria  [Encouraged]  Doctor Visits Discussed/Reviewed Doctor Visits Discussed, Doctor Visits Reviewed, Annual Wellness Visits, PCP, Specialist  [Encouraged]  Health Screening Bone Density, Colonoscopy, Mammogram  [Encouraged]  Durable Medical Equipment (DME) Other  [Cane & Eyeglasses]  PCP/Specialist Visits Compliance with follow-up visit  [Encouraged]  Communication with PCP/Specialists, RN  Level of Care Adult Daycare, Applications, Assisted Living, Skilled Nursing Facility, Personal Care Services  [Encouraged]  Applications Medicaid, Personal Care Services, FL-2  [Encouraged]  Exercise Interventions   Exercise Discussed/Reviewed Exercise Discussed, Assistive device use and maintanence, Physical Activity, Weight Managment, Exercise Reviewed  [Encouraged]  Physical  Activity Discussed/Reviewed Physical Activity Discussed, Home Exercise Program (HEP), Physical Activity Reviewed, Types of exercise  [Encouraged]  Weight Management Weight maintenance  [Encouraged]  Education Interventions   Education Provided Provided Therapist, sports, Provided Web-based Education, Provided Education  Provided Verbal Education On Nutrition, Mental Health/Coping with Illness, When to see the doctor, [employer], [employer], Medication, Exercise, Applications, Eye Care  [employer], Personal Care Services, FL-2  [Encouraged]  Mental Health Interventions   Mental Health Discussed/Reviewed Mental Health Discussed, Anxiety, Depression, Grief and Loss, Mental Health Reviewed, Coping Strategies, Substance Abuse, Suicide, Crisis, Other  [Domestic Violence & Family Discord]  Refer to Social Work for counseling regarding Anxiety/Coping, Depression  Refer to Social Work for resources regarding Assisted Living/Skilled Nursing Facility, Crisis, Mental Health, Adult Daycare, Substance Abuse  Nutrition Interventions   Nutrition Discussed/Reviewed Nutrition Discussed, Nutrition Reviewed, Carbohydrate meal planning, Supplmental nutrition, Decreasing salt, Portion sizes, Decreasing sugar intake, Fluid intake, Decreasing fats, Increaing proteins, Adding fruits and vegetables  [Encouraged]  Pharmacy Interventions   Pharmacy Dicussed/Reviewed Pharmacy Topics Discussed, Medication Adherence, Affording Medications, Pharmacy Topics Reviewed  [Encouraged]  Safety Interventions   Safety Discussed/Reviewed Safety Discussed, Safety Reviewed, Fall Risk, Home Safety  [Encouraged]  Home Safety Assistive Devices, Need for home safety assessment, Refer for community resources  [Encouraged]  Advanced Directive Interventions   Advanced Directives Discussed/Reviewed Advanced Directives Discussed  [Encouraged Completion]     Active Listening & Reflection Utilized.   Verbalization of Feelings Encouraged.  Emotional Support Provided. Feelings of Loneliness, Isolation & Hopelessness Validated. Symptoms of Anxiety & Depression Acknowledged. Suicidal Ideation Assessed - None Present. Homicidal Ideation Assessed - None Present. Support Groups Reviewed. Self-Enrollment in Support Group of Interest Emphasized. Problem Solving Interventions Implemented. Task-Centered Solutions Activated.   Solution-Focused Strategies Employed. Acceptance & Commitment Therapy Initiated. Cognitive Behavioral Therapy  Performed. Client-Centered Therapy Indicated. Extensive Discussion Regarding Palliative Care Services & Informed Primary Care Provider, Dr. Delynn Flavin with [facility] [phone]), of Possible Interest in Referral to [facility], [address] [place], [place] [address] [phone]). Obtained Verbal Consent from Patient to Allow CSW to Provide Ongoing Psychotherapeutic Counseling & Supportive Services. Obtained Verbal Consent from Patient to Allow CSW to Collaborate with Primary Care Provider, Dr. Delynn Flavin with [facility] [phone]), Regarding Current Psychotropic Medication Regimen & Consideration of Changes. CSW Collaboration with Primary Care Provider, Dr. Delynn Flavin with [facility] [phone]), Via Secure Chat Message in Epic, to Request Review of Patient's Current Psychotropic Medication Regimen & Consideration of Changes. Congratulations on Being Able to Move Back into Your Own Apartment, Now that 51-Year-Old Daughter Has Obtained Permanent, Safe & Affordable 1 Bedroom Housing at [facility], [address] [place], [place] [address] [phone]). Continue to Receive Care & Supervision from Granddaughter, Monday through Sunday, 7:00 PM to 7:00 AM. Please Review Educational Material on "Loneliness &  Isolation": [employer], Emailed on 12/29/2022 & Consider Self-Enrollment in [place] Activities of Interest. Please Contact CSW Directly (# [phone]), if You Have Questions, Need Assistance, or If Additional Social Work Needs Are Identified Between Now & Our Next Scheduled Follow-Up Outreach Call.      Our next appointment is by telephone on  01/11/2023 at 12:15 pm.  Please call the care guide team at [phone] if you need to cancel or reschedule your appointment.   If you are experiencing a Mental Health or Behavioral Health Crisis or need someone to talk to, please call the Suicide and Crisis Lifeline: 988 call the [place] National Suicide Prevention Lifeline: (920) 728-6012 or TTY: (825) 086-6629 TTY (605)636-0703) to talk to a trained counselor call 1-800-273-TALK (toll free, 24 hour hotline) go to [facility] [address] [place] [phone]) call the [facility] Crisis Line: [phone] call 911  Patient verbalizes understanding of instructions and care plan provided today and agrees to view in MyChart. Active MyChart status and patient understanding of how to access instructions and care plan via MyChart confirmed with patient.     Telephone follow up appointment with care management team member scheduled for:   01/11/2023 at 12:15 pm.  Danford Bad, BSW, MSW, LCSW  Licensed Clinical Social Worker  [employer] [facility]  Mailing [place]. [address], [place], [place] [address] Physical Address[address], [place], [place] [address] Toll Free Main # [phone] Fax # [phone] Cell # [phone] [email or web address]

## 2022-12-29 NOTE — Patient Outreach (Signed)
Care Coordination   Follow Up Visit Note   12/29/2022  Name: Tammie Martin MRN: [id number] DOB: 06/19/1951  Tammie Martin is a 72 y.o. year old female who sees Raliegh Ip, DO for primary care. I spoke with Tammie Martin by phone today.  What matters to the patients health and wellness today?  Receive Counseling and Supportive Services for Symptoms of Anxiety and Depression.   Goals Addressed               This Visit's Progress     Receive Counseling and Supportive Services for Symptoms of Anxiety and Depression. (pt-stated)   On track     Care Coordination Interventions:   Interventions Today    Flowsheet Row Most Recent Value  Chronic Disease   Chronic disease during today's visit Hypertension (HTN), Chronic Obstructive Pulmonary Disease (COPD), Other  [Tobacco Abuse, Generalized Anxiety Disorder, Family Discord, Depression, Seizure Disorder & History of Cervical Cancer]  General Interventions   General Interventions Discussed/Reviewed General Interventions Discussed, Labs, Vaccines, Health Screening, [employer], Level of Care, Communication with, Doctor Visits, General Interventions Reviewed, Annual Eye Exam, Horticulturist, commercial (DME)  [Communication with Primary Care Provider]  Labs Hgb A1c annually  [Encouraged]  Vaccines COVID-19, Flu, Pneumonia, RSV, Shingles, Tetanus/Pertussis/Diphtheria  [Encouraged]  Doctor Visits Discussed/Reviewed Doctor Visits Discussed, Doctor Visits Reviewed, Annual Wellness Visits, PCP, Specialist  [Encouraged]  Health Screening Bone Density, Colonoscopy, Mammogram  [Encouraged]  Durable Medical Equipment (DME) Other  [Cane & Eyeglasses]  PCP/Specialist Visits Compliance with follow-up visit  [Encouraged]  Communication with PCP/Specialists, RN  Level of Care Adult Daycare, Applications, Assisted Living, Skilled Nursing Facility, Personal Care Services  [Encouraged]  Applications Medicaid, Personal Care  Services, FL-2  [Encouraged]  Exercise Interventions   Exercise Discussed/Reviewed Exercise Discussed, Assistive device use and maintanence, Physical Activity, Weight Managment, Exercise Reviewed  [Encouraged]  Physical Activity Discussed/Reviewed Physical Activity Discussed, Home Exercise Program (HEP), Physical Activity Reviewed, Types of exercise  [Encouraged]  Weight Management Weight maintenance  [Encouraged]  Education Interventions   Education Provided Provided Therapist, sports, Provided Web-based Education, Provided Education  Provided Verbal Education On Nutrition, Mental Health/Coping with Illness, When to see the doctor, [employer], [employer], Medication, Exercise, Applications, Eye Care  [employer], Personal Care Services, FL-2  [Encouraged]  Mental Health Interventions   Mental Health Discussed/Reviewed Mental Health Discussed, Anxiety, Depression, Grief and Loss, Mental Health Reviewed, Coping Strategies, Substance Abuse, Suicide, Crisis, Other  [Domestic Violence & Family Discord]  Refer to Social Work for counseling regarding Anxiety/Coping, Depression  Refer to Social Work for resources regarding Assisted Living/Skilled Nursing Facility, Crisis, Mental Health, Adult Daycare, Substance Abuse  Nutrition Interventions   Nutrition Discussed/Reviewed Nutrition Discussed, Nutrition Reviewed, Carbohydrate meal planning, Supplmental nutrition, Decreasing salt, Portion sizes, Decreasing sugar intake, Fluid intake, Decreasing fats, Increaing proteins, Adding fruits and vegetables  [Encouraged]  Pharmacy Interventions   Pharmacy Dicussed/Reviewed Pharmacy Topics Discussed, Medication Adherence, Affording Medications, Pharmacy Topics Reviewed  [Encouraged]  Safety Interventions   Safety Discussed/Reviewed Safety Discussed, Safety Reviewed, Fall Risk, Home Safety  [Encouraged]  Home Safety Assistive Devices, Need for home safety assessment, Refer for  community resources  [Encouraged]  Advanced Directive Interventions   Advanced Directives Discussed/Reviewed Advanced Directives Discussed  [Encouraged Completion]     Active Listening & Reflection Utilized.  Verbalization of Feelings Encouraged.  Emotional Support Provided. Feelings of Loneliness, Isolation & Hopelessness Validated. Symptoms of Anxiety & Depression Acknowledged. Suicidal Ideation Assessed - None Present. Homicidal  Ideation Assessed - None Present. Support Groups Reviewed. Self-Enrollment in Support Group of Interest Emphasized. Problem Solving Interventions Implemented. Task-Centered Solutions Activated.   Solution-Focused Strategies Employed. Acceptance & Commitment Therapy Initiated. Cognitive Behavioral Therapy Performed. Client-Centered Therapy Indicated. Extensive Discussion Regarding Palliative Care Services & Informed Primary Care Provider, Dr. Delynn Flavin with [facility] [phone]), of Possible Interest in Referral to [facility], [address] [place], [place] [address] [phone]). Obtained Verbal Consent from Patient to Allow CSW to Provide Ongoing Psychotherapeutic Counseling & Supportive Services. Obtained Verbal Consent from Patient to Allow CSW to Collaborate with Primary Care Provider, Dr. Delynn Flavin with [facility] [phone]), Regarding Current Psychotropic Medication Regimen & Consideration of Changes. CSW Collaboration with Primary Care Provider, Dr. Delynn Flavin with [facility] [phone]), Via Secure Chat Message in Epic, to Request Review of Patient's Current Psychotropic Medication Regimen & Consideration of Changes. Congratulations on Being Able to Move Back into Your Own Apartment, Now that 64-Year-Old Daughter Has Obtained Permanent, Safe & Affordable 1 Bedroom Housing at [employer], [address] [place], [place] [address] [phone]). Continue to Receive Care & Supervision from Granddaughter, Monday through Sunday, 7:00 PM to 7:00 AM. Please Review Educational Material on "Loneliness & Isolation": [employer], Emailed on 12/29/2022 & Consider Self-Enrollment in [place] Activities of Interest. Please Contact CSW Directly (# [phone]), if You Have Questions, Need Assistance, or If Additional Social Work Needs Are Identified Between Now & Our Next Scheduled Follow-Up Outreach Call.      SDOH assessments and interventions completed:  Yes.  Care Coordination Interventions:  Yes, provided.   Follow up plan: Follow up call scheduled for 01/11/2023 at 12:15 pm.  Encounter Outcome:  Pt. Visit Completed.   Tammie Martin, BSW, MSW, LCSW  Licensed Restaurant manager, [employer] [facility]  Mailing [place] [address] [place], [place] [address] Physical Address[address], [place], [place] [address] Toll Free Main # [phone] Fax # [phone] Cell # [phone] [email or web address]

## 2022-12-31 DIAGNOSIS — F17210 Nicotine dependence, cigarettes, uncomplicated: Secondary | ICD-10-CM | POA: Diagnosis not present

## 2022-12-31 DIAGNOSIS — Z7901 Long term (current) use of anticoagulants: Secondary | ICD-10-CM | POA: Diagnosis not present

## 2022-12-31 DIAGNOSIS — E039 Hypothyroidism, unspecified: Secondary | ICD-10-CM | POA: Diagnosis not present

## 2022-12-31 DIAGNOSIS — I480 Paroxysmal atrial fibrillation: Secondary | ICD-10-CM | POA: Diagnosis not present

## 2022-12-31 DIAGNOSIS — S72142D Displaced intertrochanteric fracture of left femur, subsequent encounter for closed fracture with routine healing: Secondary | ICD-10-CM | POA: Diagnosis not present

## 2022-12-31 DIAGNOSIS — I10 Essential (primary) hypertension: Secondary | ICD-10-CM | POA: Diagnosis not present

## 2022-12-31 DIAGNOSIS — Z96642 Presence of left artificial hip joint: Secondary | ICD-10-CM | POA: Diagnosis not present

## 2022-12-31 DIAGNOSIS — Z8673 Personal history of transient ischemic attack (TIA), and cerebral infarction without residual deficits: Secondary | ICD-10-CM | POA: Diagnosis not present

## 2022-12-31 DIAGNOSIS — Z9181 History of falling: Secondary | ICD-10-CM | POA: Diagnosis not present

## 2022-12-31 DIAGNOSIS — R569 Unspecified convulsions: Secondary | ICD-10-CM | POA: Diagnosis not present

## 2023-01-04 ENCOUNTER — Telehealth: Payer: Self-pay | Admitting: Family Medicine

## 2023-01-04 DIAGNOSIS — S72009A Fracture of unspecified part of neck of unspecified femur, initial encounter for closed fracture: Secondary | ICD-10-CM

## 2023-01-04 DIAGNOSIS — F329 Major depressive disorder, single episode, unspecified: Secondary | ICD-10-CM

## 2023-01-04 DIAGNOSIS — R627 Adult failure to thrive: Secondary | ICD-10-CM

## 2023-01-04 NOTE — Telephone Encounter (Signed)
Judeth Cornfield called from [facility] care stating that she spoke with pt a few minutes ago and has her scheduled for pallative care but says they are booked and can't get anyone out there to see pt for a few weeks unless PCP wants order to be changed to hospice but says she doesn't think pt meets requirements for hospice. Says pt has not been admitted with them so there isnt a lot they can do but says she is worried about pt because pt says she has stopped taking her meds and sounds down and depressed. Needs advise on what PCP thinks is best.  Can contact Judeth Cornfield or send her a message through [employer].

## 2023-01-04 NOTE — Telephone Encounter (Signed)
Judeth Cornfield called back. Wants to know if PCP will send referral to them at [facility], [facility].

## 2023-01-04 NOTE — Telephone Encounter (Signed)
Please send this to referrals.  I think Toni Amend is on pal today.  The referral to hospice was placed already.

## 2023-01-04 NOTE — Telephone Encounter (Signed)
Attempted to reach [place] at number provided. Goes to VM.  I've placed a referral to hospice.  Sounds like she may be having a failure to thrive.

## 2023-01-11 ENCOUNTER — Encounter: Payer: Self-pay | Admitting: *Deleted

## 2023-01-11 ENCOUNTER — Ambulatory Visit: Payer: Self-pay | Admitting: *Deleted

## 2023-01-11 NOTE — Patient Outreach (Signed)
Care Coordination   Follow Up Visit Note   01/11/2023  Name: Tammie Martin MRN: [id number] DOB: [birth year]  Tammie Martin is a 72 y.o. year old female who sees Raliegh Ip, DO for primary care. I spoke with [name] by phone today.  What matters to the patients health and wellness today?  Receive Counseling and Supportive Services for Symptoms of Anxiety and Depression.   Goals Addressed               This Visit's Progress     Receive Counseling and Supportive Services for Symptoms of Anxiety and Depression. (pt-stated)   On track     Care Coordination Interventions:   Interventions Today    Flowsheet Row Most Recent Value  Chronic Disease   Chronic disease during today's visit Hypertension (HTN), Chronic Obstructive Pulmonary Disease (COPD), Other  [Tobacco Abuse, Generalized Anxiety Disorder, Family Discord, Depression, Seizure Disorder & History of Cervical Cancer]  General Interventions   General Interventions Discussed/Reviewed General Interventions Discussed, Labs, Vaccines, Health Screening, [employer], Level of Care, Communication with, Doctor Visits, General Interventions Reviewed, Annual Eye Exam, Horticulturist, commercial (DME)  [Communication with Primary Care Provider]  Labs Hgb A1c annually  [Encouraged]  Vaccines COVID-19, Flu, Pneumonia, RSV, Shingles, Tetanus/Pertussis/Diphtheria  [Encouraged]  Doctor Visits Discussed/Reviewed Doctor Visits Discussed, Doctor Visits Reviewed, Annual Wellness Visits, PCP, Specialist  [Encouraged]  Health Screening Bone Density, Colonoscopy, Mammogram  [Encouraged]  Durable Medical Equipment (DME) Other  [Cane & Eyeglasses]  PCP/Specialist Visits Compliance with follow-up visit  [Encouraged]  Communication with PCP/Specialists, RN  Level of Care Adult Daycare, Applications, Assisted Living, Skilled Nursing Facility, Personal Care Services  [Encouraged]  Applications Medicaid, Personal Care  Services, FL-2  [Encouraged]  Exercise Interventions   Exercise Discussed/Reviewed Exercise Discussed, Assistive device use and maintanence, Physical Activity, Weight Managment, Exercise Reviewed  [Encouraged]  Physical Activity Discussed/Reviewed Physical Activity Discussed, Home Exercise Program (HEP), Physical Activity Reviewed, Types of exercise  [Encouraged]  Weight Management Weight maintenance  [Encouraged]  Education Interventions   Education Provided Provided Therapist, sports, Provided Web-based Education, Provided Education  Provided Verbal Education On Nutrition, Mental Health/Coping with Illness, When to see the doctor, [employer], [employer], Medication, Exercise, Applications, Eye Care  [employer], Personal Care Services, FL-2  [Encouraged]  Mental Health Interventions   Mental Health Discussed/Reviewed Mental Health Discussed, Anxiety, Depression, Grief and Loss, Mental Health Reviewed, Coping Strategies, Substance Abuse, Suicide, Crisis, Other  [Domestic Violence & Family Discord]  Refer to Social Work for counseling regarding Anxiety/Coping, Depression  Refer to Social Work for resources regarding Assisted Living/Skilled Nursing Facility, Crisis, Mental Health, Adult Daycare, Substance Abuse  Nutrition Interventions   Nutrition Discussed/Reviewed Nutrition Discussed, Nutrition Reviewed, Carbohydrate meal planning, Supplmental nutrition, Decreasing salt, Portion sizes, Decreasing sugar intake, Fluid intake, Decreasing fats, Increaing proteins, Adding fruits and vegetables  [Encouraged]  Pharmacy Interventions   Pharmacy Dicussed/Reviewed Pharmacy Topics Discussed, Medication Adherence, Affording Medications, Pharmacy Topics Reviewed  [Encouraged]  Safety Interventions   Safety Discussed/Reviewed Safety Discussed, Safety Reviewed, Fall Risk, Home Safety  [Encouraged]  Home Safety Assistive Devices, Need for home safety assessment, Refer for  community resources  [Encouraged]  Advanced Directive Interventions   Advanced Directives Discussed/Reviewed Advanced Directives Discussed  [Encouraged Completion]     Active Listening & Reflection Utilized.  Verbalization of Feelings Encouraged.  Emotional Support Provided. Diminished Feelings of Loneliness, Isolation & Hopelessness Validated. Reduction in Symptoms of Anxiety & Depression Acknowledged. Suicidal Ideation Assessed -  None Present. Homicidal Ideation Assessed - None Present. Problem Solving Interventions Implemented. Task-Centered Solutions Activated.   Solution-Focused Strategies Employed. Acceptance & Commitment Therapy Initiated. Cognitive Behavioral Therapy Performed. Client-Centered Therapy Indicated. Please Continue to Administer All Medications Exactly as Prescribed. Implementation of Deep Breathing Exercises, Relaxation Techniques & Mindfulness Meditation Strategies Encouraged Daily. Confirmed Hospice & Palliative Care Order from Primary Care Provider, Dr. Delynn Flavin with [facility] [phone]) & Referral Placed to [facility] [phone]), [facility]. Confirmed Receipt & Thoroughly Reviewed Educational Material on "Loneliness & Isolation": [employer], to [employer] Understanding & Encourage Self-Enrollment in [employer] Activities of Interest Please Contact CSW Directly (# [phone]), if You Have Questions, Need Assistance, or If Additional Social Work Needs Are Identified Between Now & Our Next Scheduled Follow-Up Outreach Call.      SDOH assessments and interventions completed:  Yes.  Care Coordination Interventions:  Yes, provided.   Follow up plan: Follow up call scheduled for 01/19/2023 at 3:45 pm.   Encounter Outcome:  Pt. Visit Completed.   Danford Bad, BSW, MSW, LCSW  Licensed Designer, [employer]ellery [facility]  Mailing [place] [address] [place], [place] [address] Physical Address[address] [place], [place] [address] Toll Free Main # [phone] Fax # [phone] Cell # [phone] [email or web address]

## 2023-01-11 NOTE — Patient Instructions (Signed)
Visit Information  Thank you for taking time to visit with me today. Please don't hesitate to contact me if I can be of assistance to you.   Following are the goals we discussed today:   Goals Addressed               This Visit's Progress     Receive Counseling and Supportive Services for Symptoms of Anxiety and Depression. (pt-stated)   On track     Care Coordination Interventions:   Interventions Today    Flowsheet Row Most Recent Value  Chronic Disease   Chronic disease during today's visit Hypertension (HTN), Chronic Obstructive Pulmonary Disease (COPD), Other  [Tobacco Abuse, Generalized Anxiety Disorder, Family Discord, Depression, Seizure Disorder & History of Cervical Cancer]  General Interventions   General Interventions Discussed/Reviewed General Interventions Discussed, Labs, Vaccines, Health Screening, [employer], Level of Care, Communication with, Doctor Visits, General Interventions Reviewed, Annual Eye Exam, Horticulturist, commercial (DME)  [Communication with Primary Care Provider]  Labs Hgb A1c annually  [Encouraged]  Vaccines COVID-19, Flu, Pneumonia, RSV, Shingles, Tetanus/Pertussis/Diphtheria  [Encouraged]  Doctor Visits Discussed/Reviewed Doctor Visits Discussed, Doctor Visits Reviewed, Annual Wellness Visits, PCP, Specialist  [Encouraged]  Health Screening Bone Density, Colonoscopy, Mammogram  [Encouraged]  Durable Medical Equipment (DME) Other  [Cane & Eyeglasses]  PCP/Specialist Visits Compliance with follow-up visit  [Encouraged]  Communication with PCP/Specialists, RN  Level of Care Adult Daycare, Applications, Assisted Living, Skilled Nursing Facility, Personal Care Services  [Encouraged]  Applications Medicaid, Personal Care Services, [place]-2  [Encouraged]  Exercise Interventions   Exercise Discussed/Reviewed Exercise Discussed, Assistive device use and maintanence, Physical Activity, Weight Managment, Exercise Reviewed  [Encouraged]  Physical  Activity Discussed/Reviewed Physical Activity Discussed, Home Exercise Program (HEP), Physical Activity Reviewed, Types of exercise  [Encouraged]  Weight Management Weight maintenance  [Encouraged]  Education Interventions   Education Provided Provided Therapist, sports, Provided Web-based Education, Provided Education  Provided Verbal Education On Nutrition, Mental Health/Coping with Illness, When to see the doctor, [employer], [employer], Medication, Exercise, Applications, Eye Care  [employer], Personal Care Services, [place]-2  [Encouraged]  Mental Health Interventions   Mental Health Discussed/Reviewed Mental Health Discussed, Anxiety, Depression, Grief and Loss, Mental Health Reviewed, Coping Strategies, Substance Abuse, Suicide, Crisis, Other  [Domestic Violence & Family Discord]  Refer to Social Work for counseling regarding Anxiety/Coping, Depression  Refer to Social Work for resources regarding Assisted Living/Skilled Nursing Facility, Crisis, Mental Health, Adult Daycare, Substance Abuse  Nutrition Interventions   Nutrition Discussed/Reviewed Nutrition Discussed, Nutrition Reviewed, Carbohydrate meal planning, Supplmental nutrition, Decreasing salt, Portion sizes, Decreasing sugar intake, Fluid intake, Decreasing fats, Increaing proteins, Adding fruits and vegetables  [Encouraged]  Pharmacy Interventions   Pharmacy Dicussed/Reviewed Pharmacy Topics Discussed, Medication Adherence, Affording Medications, Pharmacy Topics Reviewed  [Encouraged]  Safety Interventions   Safety Discussed/Reviewed Safety Discussed, Safety Reviewed, Fall Risk, Home Safety  [Encouraged]  Home Safety Assistive Devices, Need for home safety assessment, Refer for community resources  [Encouraged]  Advanced Directive Interventions   Advanced Directives Discussed/Reviewed Advanced Directives Discussed  [Encouraged Completion]     Active Listening & Reflection Utilized.   Verbalization of Feelings Encouraged.  Emotional Support Provided. Diminished Feelings of Loneliness, Isolation & Hopelessness Validated. Reduction in Symptoms of Anxiety & Depression Acknowledged. Suicidal Ideation Assessed - None Present. Homicidal Ideation Assessed - None Present. Problem Solving Interventions Implemented. Task-Centered Solutions Activated.   Solution-Focused Strategies Employed. Acceptance & Commitment Therapy Initiated. Cognitive Behavioral Therapy Performed. Client-Centered Therapy Indicated. Please Continue to  Administer All Medications Exactly as Prescribed. Implementation of Deep Breathing Exercises, Relaxation Techniques & Mindfulness Meditation Strategies Encouraged Daily. Confirmed Hospice & Palliative Care Order from Primary Care Provider, Dr. Delynn Flavin with [facility] [phone]) & Referral Placed to [facility] [phone]), Formerly Known [facility]. Confirmed Receipt & Thoroughly Reviewed Educational Material on "Loneliness & Isolation": [employer], to [employer] Understanding & Encourage Self-Enrollment in [employer] Activities of Interest Please Contact CSW Directly (# [phone]), if You Have Questions, Need Assistance, or If Additional Social Work Needs Are Identified Between Now & Our Next Scheduled Follow-Up Outreach Call.      Our next appointment is by telephone on 01/19/2023 at 3:45 pm.   Please call the care guide team at [phone] if you need to cancel or reschedule your appointment.   If you are experiencing a Mental Health or Behavioral Health Crisis or need someone to talk to, please call the Suicide and Crisis Lifeline: 988 call the [place] National Suicide Prevention Lifeline: 313-621-8403 or TTY: 712-361-2794 TTY (612) 122-9690) to talk to a trained counselor call 1-800-273-TALK (toll free, 24 hour hotline) go to [facility]  Urgent Care [address] [place] [phone]) call the [facility] Crisis Line: [phone] call 911  Patient verbalizes understanding of instructions and care plan provided today and agrees to view in MyChart. Active MyChart status and patient understanding of how to access instructions and care plan via MyChart confirmed with patient.     Telephone follow up appointment with care management team member scheduled for:  01/19/2023 at 3:45 pm.   Danford Bad, BSW, MSW, LCSW  Licensed Clinical Social Worker  [facility]  Mailing [place]. [address], [place], [place] [address] Physical Address[address], [place], [place] [address] Toll Free Main # [phone] Fax # [phone] Cell # [phone] [email or web address]

## 2023-01-16 ENCOUNTER — Other Ambulatory Visit: Payer: Self-pay | Admitting: Family Medicine

## 2023-01-16 ENCOUNTER — Telehealth: Payer: Self-pay | Admitting: Family Medicine

## 2023-01-16 NOTE — Progress Notes (Signed)
Patient on hospice care.  She has elected to dc eliquis and atorvastatin  Medications Discontinued During This Encounter  Medication Reason   atorvastatin (LIPITOR) 40 MG tablet    ELIQUIS 5 MG TABS tablet

## 2023-01-19 ENCOUNTER — Ambulatory Visit: Payer: Self-pay | Admitting: *Deleted

## 2023-01-19 ENCOUNTER — Encounter: Payer: Self-pay | Admitting: *Deleted

## 2023-01-19 NOTE — Patient Instructions (Signed)
Visit Information  Thank you for taking time to visit with me today. Please don't hesitate to contact me if I can be of assistance to you.   Following are the goals we discussed today:   Goals Addressed               This Visit's Progress     COMPLETED: Receive Counseling and Supportive Services for Symptoms of Anxiety and Depression. (pt-stated)   On track     Care Coordination Interventions:   Interventions Today    Flowsheet Row Most Recent Value  Chronic Disease   Chronic disease during today's visit Hypertension (HTN), Chronic Obstructive Pulmonary Disease (COPD), Other  [Tobacco Abuse, Generalized Anxiety Disorder, Family Discord, Depression, Seizure Disorder & History of Cervical Cancer]  General Interventions   General Interventions Discussed/Reviewed General Interventions Discussed, Labs, Vaccines, Health Screening, [employer], Level of Care, Communication with, Doctor Visits, General Interventions Reviewed, Annual Eye Exam, Horticulturist, commercial (DME)  [Communication with Primary Care Provider]  Labs Hgb A1c annually  [Encouraged]  Vaccines COVID-19, Flu, Pneumonia, RSV, Shingles, Tetanus/Pertussis/Diphtheria  [Encouraged]  Doctor Visits Discussed/Reviewed Doctor Visits Discussed, Doctor Visits Reviewed, Annual Wellness Visits, PCP, Specialist  [Encouraged]  Health Screening Bone Density, Colonoscopy, Mammogram  [Encouraged]  Durable Medical Equipment (DME) Other  [Cane & Eyeglasses]  PCP/Specialist Visits Compliance with follow-up visit  [Encouraged]  Communication with PCP/Specialists, RN  Level of Care Adult Daycare, Applications, Assisted Living, Skilled Nursing Facility, Personal Care Services  [Encouraged]  Applications Medicaid, Personal Care Services, [place]-2  [Encouraged]  Exercise Interventions   Exercise Discussed/Reviewed Exercise Discussed, Assistive device use and maintanence, Physical Activity, Weight Managment, Exercise Reviewed  [Encouraged]   Physical Activity Discussed/Reviewed Physical Activity Discussed, Home Exercise Program (HEP), Physical Activity Reviewed, Types of exercise  [Encouraged]  Weight Management Weight maintenance  [Encouraged]  Education Interventions   Education Provided Provided Therapist, sports, Provided Web-based Education, Provided Education  Provided Verbal Education On Nutrition, Mental Health/Coping with Illness, When to see the doctor, [employer], [employer], Medication, Exercise, Applications, Eye Care  [employer], Personal Care Services, [place]-2  [Encouraged]  Mental Health Interventions   Mental Health Discussed/Reviewed Mental Health Discussed, Anxiety, Depression, Grief and Loss, Mental Health Reviewed, Coping Strategies, Substance Abuse, Suicide, Crisis, Other  [Domestic Violence & Family Discord]  Refer to Social Work for counseling regarding Anxiety/Coping, Depression  Refer to Social Work for resources regarding Assisted Living/Skilled Nursing Facility, Crisis, Mental Health, Adult Daycare, Substance Abuse  Nutrition Interventions   Nutrition Discussed/Reviewed Nutrition Discussed, Nutrition Reviewed, Carbohydrate meal planning, Supplmental nutrition, Decreasing salt, Portion sizes, Decreasing sugar intake, Fluid intake, Decreasing fats, Increaing proteins, Adding fruits and vegetables  [Encouraged]  Pharmacy Interventions   Pharmacy Dicussed/Reviewed Pharmacy Topics Discussed, Medication Adherence, Affording Medications, Pharmacy Topics Reviewed  [Encouraged]  Safety Interventions   Safety Discussed/Reviewed Safety Discussed, Safety Reviewed, Fall Risk, Home Safety  [Encouraged]  Home Safety Assistive Devices, Need for home safety assessment, Refer for community resources  [Encouraged]  Advanced Directive Interventions   Advanced Directives Discussed/Reviewed Advanced Directives Discussed  [Encouraged Completion]     Active Listening & Reflection Utilized.   Verbalization of Feelings Encouraged.  Emotional Support Provided. Feelings of Relief Validated, Since Receiving Hospice & Palliative Care Services. Reduction in Symptoms of Anxiety & Depression Acknowledged. Problem Solving Interventions Reviewed. Task-Centered Solutions Completed.   Solution-Focused Strategies Employed. Acceptance & Commitment Therapy Initiated. Cognitive Behavioral Therapy Performed. Client-Centered Therapy Implemented. Confirmed Hospice & Palliative Care Services in Place, Through [facility] Compassionate  Care (# [phone]), Formerly Known [facility]. Confirmed the Following List of Services Being Provided in The Home, Through [facility] Compassionate Care [phone]): ~ Hospice & Palliative Care Aid - 2 Times Per Week ~ Hospice & Palliative Care Nurse - 1 Time Per Week ~ Hospice & Palliative Care Social Worker - 1 Time Per Week ~ Hospice & Palliative Care Chaplain - 1 Time Per Month Confirmed Granddaughter Has Moved Into Your Home & Providing 24 Hour Care & Supervision, as Well as Assistance with Activities of Daily Living, Meal Preparation, Light Housekeeping Duties, Laundry, Medication Administration, Etc. Please Contact CSW Directly (# [phone]), if You Have Questions, Need Assistance, or If Additional Social Work Needs Are Identified in The Near Future.      Please call the care guide team at [phone] if you need to cancel or reschedule your appointment.   If you are experiencing a Mental Health or Behavioral Health Crisis or need someone to talk to, please call the Suicide and Crisis Lifeline: 988 call the [place] National Suicide Prevention Lifeline: (239) 796-8005 or TTY: (408)486-8027 TTY 626-158-4842) to talk to a trained counselor call 1-800-273-TALK (toll free, 24 hour hotline) go to [facility] Urgent Care [address] [place] [phone]) call the [facility] Crisis Line: [phone] call  911  Patient verbalizes understanding of instructions and care plan provided today and agrees to view in MyChart. Active MyChart status and patient understanding of how to access instructions and care plan via MyChart confirmed with patient.     No further follow up required.  Danford Bad, BSW, MSW, LCSW  Licensed Restaurant manager, [employer] [facility]  Mailing [place] [address] [place], [place] [address] Physical Address-[address], [place], [place] [address] Toll Free Main # [phone] Fax # [phone] Cell # [phone] [email or web address]

## 2023-01-19 NOTE — Patient Outreach (Signed)
Care Coordination   Follow Up Visit Note   01/19/2023  Name: Tammie Martin MRN: [id number] DOB: 1951/01/29  Tammie Martin is a 72 y.o. year old female who sees Tammie Ip, DO for primary care. I spoke with Tammie Martin by phone today.  What matters to the patients health and wellness today?  Receive Counseling and Supportive Services for Symptoms of Anxiety and Depression.   Goals Addressed               This Visit's Progress     COMPLETED: Receive Counseling and Supportive Services for Symptoms of Anxiety and Depression. (pt-stated)   On track     Care Coordination Interventions:   Interventions Today    Flowsheet Row Most Recent Value  Chronic Disease   Chronic disease during today's visit Hypertension (HTN), Chronic Obstructive Pulmonary Disease (COPD), Other  [Tobacco Abuse, Generalized Anxiety Disorder, Family Discord, Depression, Seizure Disorder & History of Cervical Cancer]  General Interventions   General Interventions Discussed/Reviewed General Interventions Discussed, Labs, Vaccines, Health Screening, [employer], Level of Care, Communication with, Doctor Visits, General Interventions Reviewed, Annual Eye Exam, Horticulturist, commercial (DME)  [Communication with Primary Care Provider]  Labs Hgb A1c annually  [Encouraged]  Vaccines COVID-19, Flu, Pneumonia, RSV, Shingles, Tetanus/Pertussis/Diphtheria  [Encouraged]  Doctor Visits Discussed/Reviewed Doctor Visits Discussed, Doctor Visits Reviewed, Annual Wellness Visits, PCP, Specialist  [Encouraged]  Health Screening Bone Density, Colonoscopy, Mammogram  [Encouraged]  Durable Medical Equipment (DME) Other  [Cane & Eyeglasses]  PCP/Specialist Visits Compliance with follow-up visit  [Encouraged]  Communication with PCP/Specialists, RN  Level of Care Adult Daycare, Applications, Assisted Living, Skilled Nursing Facility, Personal Care Services  [Encouraged]  Applications Medicaid, Personal  Care Services, [place]-2  [Encouraged]  Exercise Interventions   Exercise Discussed/Reviewed Exercise Discussed, Assistive device use and maintanence, Physical Activity, Weight Managment, Exercise Reviewed  [Encouraged]  Physical Activity Discussed/Reviewed Physical Activity Discussed, Home Exercise Program (HEP), Physical Activity Reviewed, Types of exercise  [Encouraged]  Weight Management Weight maintenance  [Encouraged]  Education Interventions   Education Provided Provided Therapist, sports, Provided Web-based Education, Provided Education  Provided Verbal Education On Nutrition, Mental Health/Coping with Illness, When to see the doctor, [employer], [employer], Medication, Exercise, Applications, Eye Care  [employer], Personal Care Services, [place]-2  [Encouraged]  Mental Health Interventions   Mental Health Discussed/Reviewed Mental Health Discussed, Anxiety, Depression, Grief and Loss, Mental Health Reviewed, Coping Strategies, Substance Abuse, Suicide, Crisis, Other  [Domestic Violence & Family Discord]  Refer to Social Work for counseling regarding Anxiety/Coping, Depression  Refer to Social Work for resources regarding Assisted Living/Skilled Nursing Facility, Crisis, Mental Health, Adult Daycare, Substance Abuse  Nutrition Interventions   Nutrition Discussed/Reviewed Nutrition Discussed, Nutrition Reviewed, Carbohydrate meal planning, Supplmental nutrition, Decreasing salt, Portion sizes, Decreasing sugar intake, Fluid intake, Decreasing fats, Increaing proteins, Adding fruits and vegetables  [Encouraged]  Pharmacy Interventions   Pharmacy Dicussed/Reviewed Pharmacy Topics Discussed, Medication Adherence, Affording Medications, Pharmacy Topics Reviewed  [Encouraged]  Safety Interventions   Safety Discussed/Reviewed Safety Discussed, Safety Reviewed, Fall Risk, Home Safety  [Encouraged]  Home Safety Assistive Devices, Need for home safety assessment, Refer  for community resources  [Encouraged]  Advanced Directive Interventions   Advanced Directives Discussed/Reviewed Advanced Directives Discussed  [Encouraged Completion]     Active Listening & Reflection Utilized.  Verbalization of Feelings Encouraged.  Emotional Support Provided. Feelings of Relief Validated, Since Receiving Hospice & Palliative Care Services. Reduction in Symptoms of Anxiety & Depression Acknowledged.  Problem Solving Interventions Reviewed. Task-Centered Solutions Completed.   Solution-Focused Strategies Employed. Acceptance & Commitment Therapy Initiated. Cognitive Behavioral Therapy Performed. Client-Centered Therapy Implemented. Confirmed Hospice & Palliative Care Services in Place, Through [facility] [phone]), Formerly Known [facility]. Confirmed the Following List of Services Being Provided in The Home, Through [facility] Compassionate Care [phone]): ~ Hospice & Palliative Care Aid - 2 Times Per Week ~ Hospice & Palliative Care Nurse - 1 Time Per Week ~ Hospice & Palliative Care Social Worker - 1 Time Per Week ~ Hospice & Palliative Care Chaplain - 1 Time Per Month Confirmed Granddaughter Has Moved Into Your Home & Providing 24 Hour Care & Supervision, as Well as Assistance with Activities of Daily Living, Meal Preparation, Light Housekeeping Duties, Laundry, Medication Administration, Etc. Please Contact CSW Directly (# [phone]), if You Have Questions, Need Assistance, or If Additional Social Work Needs Are Identified in The Near Future.      SDOH assessments and interventions completed:  Yes.  Care Coordination Interventions:  Yes, provided.   Follow up plan: No further intervention required.   Encounter Outcome:  Pt. Visit Completed.   Tammie Martin, BSW, MSW, LCSW  Licensed Restaurant manager, fast food [facility]  Mailing [address], [place],  [place] [address] Physical Address[address], [place], [place] [address] Toll Free Main # [phone] Fax # [phone] Cell # [phone] [email or web address]

## 2023-01-25 ENCOUNTER — Telehealth (HOSPITAL_COMMUNITY): Payer: Self-pay | Admitting: Cardiology

## 2023-01-25 NOTE — Telephone Encounter (Signed)
01/25/23 Sister called and patient is under [facility] care and will not need appointments for echocardiogram and structural heart follow up. Order will be removed from the echo WQ. Thank You.

## 2023-01-26 NOTE — Telephone Encounter (Signed)
[place] Cardiomyopathy Questionnaire     01/26/2023   12:01 PM 03/21/2022   11:27 AM 11/30/2021   12:15 PM  KCCQ-12  1 a. Ability to shower/bathe Not at all limited Not at all limited Slightly limited  1 b. Ability to walk 1 block Other, Did not do Not at all limited Quite a bit limited  1 c. Ability to hurry/jog Other, Did not do Not at all limited Extremely limited  2. Edema feet/ankles/legs Never over the past 2 weeks Never over the past 2 weeks Never over the past 2 weeks  3. Limited by fatigue All of the time Never over the past 2 weeks All of the time  4. Limited by dyspnea All of the time Never over the past 2 weeks Several times a day  5. Sitting up / on 3+ pillows Never over the past 2 weeks Never over the past 2 weeks Never over the past 2 weeks  6. Limited enjoyment of life Not limited at all Not limited at all Moderately limited  7. Rest of life w/ symptoms Mostly dissatisfied Mostly satisfied Not at all satisfied  8 a. Participation in hobbies N/A, did not do for other reasons Did not limit at all Slightly limited  8 b. Participation in chores N/A, did not do for other reasons Did not limit at all Severely limited  8 c. Visiting family/friends N/A, did not do for other reasons Did not limit at all Did not limit at all

## 2023-02-01 ENCOUNTER — Other Ambulatory Visit (HOSPITAL_COMMUNITY): Payer: Medicare HMO

## 2023-02-01 ENCOUNTER — Ambulatory Visit: Payer: Medicare HMO

## 2023-02-12 ENCOUNTER — Emergency Department (HOSPITAL_COMMUNITY)
Admission: EM | Admit: 2023-02-12 | Discharge: 2023-02-12 | Disposition: A | Discharge status: Home / Self Care | Attending: Emergency Medicine | Admitting: Emergency Medicine

## 2023-02-12 ENCOUNTER — Encounter (HOSPITAL_COMMUNITY): Payer: Self-pay | Admitting: Emergency Medicine

## 2023-02-12 ENCOUNTER — Emergency Department (HOSPITAL_COMMUNITY)

## 2023-02-12 ENCOUNTER — Other Ambulatory Visit: Payer: Self-pay

## 2023-02-12 DIAGNOSIS — Z1152 Encounter for screening for COVID-19: Secondary | ICD-10-CM | POA: Insufficient documentation

## 2023-02-12 DIAGNOSIS — R079 Chest pain, unspecified: Secondary | ICD-10-CM | POA: Diagnosis not present

## 2023-02-12 DIAGNOSIS — J441 Chronic obstructive pulmonary disease with (acute) exacerbation: Secondary | ICD-10-CM | POA: Insufficient documentation

## 2023-02-12 DIAGNOSIS — J9811 Atelectasis: Secondary | ICD-10-CM | POA: Diagnosis not present

## 2023-02-12 DIAGNOSIS — I4891 Unspecified atrial fibrillation: Secondary | ICD-10-CM | POA: Diagnosis not present

## 2023-02-12 DIAGNOSIS — R0689 Other abnormalities of breathing: Secondary | ICD-10-CM | POA: Diagnosis not present

## 2023-02-12 DIAGNOSIS — R0602 Shortness of breath: Secondary | ICD-10-CM | POA: Diagnosis not present

## 2023-02-12 DIAGNOSIS — I10 Essential (primary) hypertension: Secondary | ICD-10-CM | POA: Diagnosis not present

## 2023-02-12 DIAGNOSIS — Z743 Need for continuous supervision: Secondary | ICD-10-CM | POA: Diagnosis not present

## 2023-02-12 LAB — CBC WITH DIFFERENTIAL/PLATELET
Abs Immature Granulocytes: 0.05 10*3/uL (ref 0.00–0.07)
Basophils Absolute: 0 10*3/uL (ref 0.0–0.1)
Basophils Relative: 0 %
Eosinophils Absolute: 0 10*3/uL (ref 0.0–0.5)
Eosinophils Relative: 0 %
HCT: 30.4 % — ABNORMAL LOW (ref 36.0–46.0)
Hemoglobin: 9.1 g/dL — ABNORMAL LOW (ref 12.0–15.0)
Immature Granulocytes: 1 %
Lymphocytes Relative: 19 %
Lymphs Abs: 2 10*3/uL (ref 0.7–4.0)
MCH: 27.3 pg (ref 26.0–34.0)
MCHC: 29.9 g/dL — ABNORMAL LOW (ref 30.0–36.0)
MCV: 91.3 fL (ref 80.0–100.0)
Monocytes Absolute: 0.6 10*3/uL (ref 0.1–1.0)
Monocytes Relative: 6 %
Neutro Abs: 7.7 10*3/uL (ref 1.7–7.7)
Neutrophils Relative %: 74 %
Platelets: 157 10*3/uL (ref 150–400)
RBC: 3.33 MIL/uL — ABNORMAL LOW (ref 3.87–5.11)
RDW: 18.2 % — ABNORMAL HIGH (ref 11.5–15.5)
WBC: 10.5 10*3/uL (ref 4.0–10.5)
nRBC: 0.2 % (ref 0.0–0.2)

## 2023-02-12 LAB — COMPREHENSIVE METABOLIC PANEL
ALT: 15 U/L (ref 0–44)
AST: 19 U/L (ref 15–41)
Albumin: 2.1 g/dL — ABNORMAL LOW (ref 3.5–5.0)
Alkaline Phosphatase: 120 U/L (ref 38–126)
Anion gap: 9 (ref 5–15)
BUN: 14 mg/dL (ref 8–23)
CO2: 28 mmol/L (ref 22–32)
Calcium: 8.3 mg/dL — ABNORMAL LOW (ref 8.9–10.3)
Chloride: 101 mmol/L (ref 98–111)
Creatinine, Ser: 0.57 mg/dL (ref 0.44–1.00)
GFR, Estimated: >60 mL/min (ref >60)
Glucose, Bld: 101 mg/dL — ABNORMAL HIGH (ref 70–99)
Potassium: 3.1 mmol/L — ABNORMAL LOW (ref 3.5–5.1)
Sodium: 138 mmol/L (ref 135–145)
Total Bilirubin: 0.9 mg/dL (ref 0.3–1.2)
Total Protein: 6.3 g/dL — ABNORMAL LOW (ref 6.5–8.1)

## 2023-02-12 LAB — TROPONIN I (HIGH SENSITIVITY)
Troponin I (High Sensitivity): 53 ng/L — ABNORMAL HIGH (ref <18)
Troponin I (High Sensitivity): 57 ng/L — ABNORMAL HIGH (ref <18)

## 2023-02-12 LAB — RESP PANEL BY RT-PCR (RSV, FLU A&B, COVID)  RVPGX2
Influenza A by PCR: NEGATIVE
Influenza B by PCR: NEGATIVE
Resp Syncytial Virus by PCR: NEGATIVE
SARS Coronavirus 2 by RT PCR: NEGATIVE

## 2023-02-12 MED ORDER — ALBUTEROL SULFATE (2.5 MG/3ML) 0.083% IN NEBU
2.5000 mg | INHALATION_SOLUTION | Freq: Once | RESPIRATORY_TRACT | Status: AC
  Administered 2023-02-12: 2.5 mg via RESPIRATORY_TRACT
  Filled 2023-02-12: qty 3

## 2023-02-12 MED ORDER — IPRATROPIUM-ALBUTEROL 0.5-2.5 (3) MG/3ML IN SOLN
3.0000 mL | Freq: Once | RESPIRATORY_TRACT | Status: AC
  Administered 2023-02-12: 3 mL via RESPIRATORY_TRACT
  Filled 2023-02-12: qty 3

## 2023-02-12 NOTE — ED Provider Notes (Signed)
[place] EMERGENCY DEPARTMENT AT [facility] Provider Note   CSN: [id number] Arrival date & time: 02/12/23  1607     History {Add pertinent medical, surgical, social history, OB history to HPI:1} Chief Complaint  Patient presents with   Shortness of Breath    Tammie Martin is a 72 y.o. female.  Patient has a history of COPD and complains of some shortness of breath.   Shortness of Breath      Home Medications Prior to Admission medications   Medication Sig Start Date End Date Taking? Authorizing Provider  albuterol (VENTOLIN HFA) 108 (90 Base) MCG/ACT inhaler Inhale 2 puffs into the lungs every 6 (six) hours as needed for wheezing or shortness of breath. 11/01/21   Raliegh Ip, DO  bisoprolol (ZEBETA) 10 MG tablet Take 1 and 1/2 tablets (15 mg total) by mouth daily. Patient taking differently: Take 15 mg by mouth daily. 09/02/22   Antoine Poche, MD  Budeson-Glycopyrrol-Formoterol (BREZTRI AEROSPHERE) 160-9-4.8 MCG/ACT AERO Inhale 2 puffs into the lungs 2 (two) times daily. 12/09/22   Raliegh Ip, DO  CALCIUM-MAGNESIUM PO Take 1 tablet by mouth daily.    [provider]  cetirizine (ZYRTEC) 10 MG tablet Take 10 mg by mouth daily.    [provider]  Cholecalciferol (VITAMIN D) 50 MCG (2000 UT) tablet Take 2,000 Units by mouth daily.    [provider]  desvenlafaxine (PRISTIQ) 50 MG 24 hr tablet Take 1 tablet (50 mg total) by mouth daily. (NEEDS TO BE SEEN BEFORE NEXT REFILL) 09/26/22   Delynn Flavin M, DO  fluticasone (FLONASE) 50 MCG/ACT nasal spray Place 2 sprays into both nostrils daily as needed for allergies or rhinitis. 10/28/20   Raliegh Ip, DO  furosemide (LASIX) 20 MG tablet TAKE ONE TABLET BY MOUTH DAILY 12/15/22   Delynn Flavin M, DO  HYDROcodone-acetaminophen (NORCO/VICODIN) 5-325 MG tablet Take 1 tablet by mouth every 6 (six) hours as needed. Patient not taking: Reported on 12/14/2022 11/07/22  11/07/23  [place], Jesse J, PA-C  hydrocortisone (PROCTOSOL HC) 2.5 % rectal cream Place 1 Application rectally 2 (two) times daily. 12/06/22   Raliegh Ip, DO  hydrocortisone cream 1 % Apply 1 application. topically 2 (two) times daily as needed (rash).    [provider]  lamoTRIgine (LAMICTAL) 100 MG tablet Take 100 mg by mouth 2 (two) times daily. Patient not taking: Reported on 12/14/2022 11/24/22   [provider]  lamoTRIgine (LAMICTAL) 25 MG tablet Start with Lamotrigine 25mg  (one pill) at night for one week, then 25mg  (one pill) twice daily for one week, then 25mg  (one pill) morning and 50mg  (two pills) night for one week, then 50mg  (two pills) twice daily for one week then 75mg  (three pills) twice daily for one week then finally 100mg  (fours pills) twice daily Patient not taking: Reported on 12/14/2022 09/12/22   Windell Norfolk, MD  levETIRAcetam (KEPPRA) 750 MG tablet Take 1 tablet (750 mg total) by mouth 2 (two) times daily. 12/21/22 12/16/23  Windell Norfolk, MD  levothyroxine (SYNTHROID) 88 MCG tablet TAKE ONE TABLET BY MOUTH DAILY 09/08/22   Delynn Flavin M, DO  Multiple Vitamins-Minerals (ALIVE WOMENS 50+ PO) Take 1 tablet by mouth daily.    [provider]  Multiple Vitamins-Minerals (PRESERVISION AREDS PO) Take 1 capsule by mouth 2 (two) times daily.    [provider]  Omega-3-6-9 CAPS Take 1 capsule by mouth 2 (two) times daily.    [provider]  oxyCODONE (ROXICODONE) 5 MG immediate release tablet Take 0.5-1 tablets (2.5-5 mg total) by mouth every 4 (four) hours as needed for severe pain. 12/14/22   Harris, Cammy Copa, PA-C  Polyethyl Glycol-Propyl Glycol (SYSTANE OP) Place 1 drop into both eyes daily as needed (dry eyes).    [provider]  potassium chloride (KLOR-CON) 10 MEQ tablet Take 10 mEq by mouth daily. 11/24/22   [provider]  Probiotic Product (PROBIOTIC ADVANCED PO) Take 1 capsule by mouth daily.     [provider]  traZODone (DESYREL) 50 MG tablet Take 1 tablet (50 mg total) by mouth at bedtime as needed for sleep. 03/18/22   Delynn Flavin M, DO  TURMERIC PO Take 1 capsule by mouth 2 (two) times daily.    [provider]      Allergies    Calcium channel blockers, Penicillins, Singulair [montelukast sodium], Chantix [varenicline], Codeine, Nicoderm [nicotine], and Other    Review of Systems   Review of Systems  Respiratory:  Positive for shortness of breath.     Physical Exam Updated Vital Signs BP (!) 123/91   Pulse (!) 139   Temp 98.4 F (36.9 C)   Resp (!) 22   Ht 5\' 2"  (1.575 m)   Wt 52 kg   SpO2 93%   BMI 20.97 kg/m  Physical Exam  ED Results / Procedures / Treatments   Labs (all labs ordered are listed, but only abnormal results are displayed) Labs Reviewed  COMPREHENSIVE METABOLIC PANEL - Abnormal; Notable for the following components:      Result Value   Potassium 3.1 (*)    Glucose, Bld 101 (*)    Calcium 8.3 (*)    Total Protein 6.3 (*)    Albumin 2.1 (*)    All other components within normal limits  CBC WITH DIFFERENTIAL/PLATELET - Abnormal; Notable for the following components:   RBC 3.33 (*)    Hemoglobin 9.1 (*)    HCT 30.4 (*)    MCHC 29.9 (*)    RDW 18.2 (*)    All other components within normal limits  TROPONIN I (HIGH SENSITIVITY) - Abnormal; Notable for the following components:   Troponin I (High Sensitivity) 53 (*)    All other components within normal limits  TROPONIN I (HIGH SENSITIVITY) - Abnormal; Notable for the following components:   Troponin I (High Sensitivity) 57 (*)    All other components within normal limits  RESP PANEL BY RT-PCR (RSV, FLU A&B, COVID)  [id number]    EKG None  Radiology DG Chest Port 1 View  Result Date: 02/12/2023 CLINICAL DATA:  Shortness of breath and chest pain EXAM: PORTABLE CHEST 1 VIEW COMPARISON:  Chest x-ray 12/14/2022.  Chest CT 04/13/2022. FINDINGS: Heart is enlarged.  Patient is status post TAVR. There is minimal atelectasis in the lung bases. The lungs are otherwise clear. There is no pleural effusion or pneumothorax. No acute fractures are seen. IMPRESSION: Cardiomegaly. Minimal atelectasis in the lung bases. Electronically Signed   By: Darliss Cheney M.D.   On: 02/12/2023 17:14    Procedures Procedures  {Document cardiac monitor, telemetry assessment procedure when appropriate:1}  Medications Ordered in ED Medications  ipratropium-albuterol (DUONEB) 0.5-2.5 (3) MG/3ML nebulizer solution 3 mL (3 mLs Nebulization Given 02/12/23 1809)  albuterol (PROVENTIL) (2.5 MG/3ML) 0.083% nebulizer solution 2.5 mg (2.5 mg Nebulization Given 02/12/23 1809)    ED Course/ Medical Decision Making/ A&P   {   Click here for ABCD2, HEART and  other calculatorsREFRESH Note before signing :1}                          Medical Decision Making Risk Prescription drug management.  She was COPD that improved with neb treatment she will follow-up with her PCP  {Document critical care time when appropriate:1} {Document review of labs and clinical decision tools ie heart score, Chads2Vasc2 etc:1}  {Document your independent review of radiology images, and any outside records:1} {Document your discussion with family members, caretakers, and with consultants:1} {Document social determinants of health affecting pt's care:1} {Document your decision making why or why not admission, treatments were needed:1} Final Clinical Impression(s) / ED Diagnoses Final diagnoses:  COPD exacerbation (HCC)    Rx / DC Orders ED Discharge Orders     None

## 2023-02-12 NOTE — ED Notes (Signed)
Upon this RN's shift assessment patient is alert and oriented x 4. She has family at bedside. She reports she feels much better. She is currently on 2 L [place] with SPO2 of 93%.  She reports she recently started using oxygen at home on 1.5L. She states that she is on hospice and only came because she was miserable. She is ready to go home now. Lungs sounds diminished bilaterally. Denies pain. She denies further needs at this time. Kellogg RN

## 2023-02-12 NOTE — ED Triage Notes (Signed)
Ems called out for chest pain and sob x 2 days. When fire dept. Arrived pt was 78% on room air. Ems placed pt on 2lpm via nasal cannula.

## 2023-02-12 NOTE — ED Notes (Signed)
Dr Estell Harpin was able to speak with patient and she agreed to wait for  discharge. Kellogg RN

## 2023-02-12 NOTE — Discharge Instructions (Signed)
Follow-up with your doctor this week for recheck. 

## 2023-02-16 ENCOUNTER — Telehealth: Payer: Self-pay

## 2023-02-16 NOTE — Telephone Encounter (Signed)
Transition Care Management Follow-up Telephone Call Date of discharge and from where: [facility] How have you been since you were released from the hospital? Better  Any questions or concerns? No  Items Reviewed: Did the pt receive and understand the discharge instructions provided? Yes  Medications obtained and verified? Yes  Other? No  Any new allergies since your discharge? No  Dietary orders reviewed? No Do you have support at home? Yes  Hospice care     Follow up appointments reviewed:  PCP Hospital f/u appt confirmed? No  Scheduled to see  on  @ . Specialist Hospital f/u appt confirmed? No  Scheduled to see  on  @ . Are transportation arrangements needed? No  If their condition worsens, is the pt aware to call PCP or go to the Emergency Dept.? Yes Was the patient provided with contact information for the PCP's office or ED? Yes Was to pt encouraged to call back with questions or concerns? Yes

## 2023-03-14 ENCOUNTER — Ambulatory Visit: Payer: Medicare HMO | Admitting: Neurology

## 2023-04-09 ENCOUNTER — Encounter (HOSPITAL_COMMUNITY): Payer: Self-pay

## 2023-04-09 ENCOUNTER — Emergency Department (HOSPITAL_COMMUNITY)

## 2023-04-09 ENCOUNTER — Other Ambulatory Visit: Payer: Self-pay

## 2023-04-09 ENCOUNTER — Inpatient Hospital Stay (HOSPITAL_COMMUNITY)
Admission: EM | Admit: 2023-04-09 | Discharge: 2023-04-17 | DRG: 640 | Disposition: A | Discharge status: Skilled Nursing Facility | Attending: Internal Medicine | Admitting: Internal Medicine

## 2023-04-09 DIAGNOSIS — F17210 Nicotine dependence, cigarettes, uncomplicated: Secondary | ICD-10-CM | POA: Diagnosis not present

## 2023-04-09 DIAGNOSIS — Z515 Encounter for palliative care: Secondary | ICD-10-CM

## 2023-04-09 DIAGNOSIS — F32A Depression, unspecified: Secondary | ICD-10-CM | POA: Diagnosis present

## 2023-04-09 DIAGNOSIS — Z7189 Other specified counseling: Secondary | ICD-10-CM | POA: Diagnosis not present

## 2023-04-09 DIAGNOSIS — F0394 Unspecified dementia, unspecified severity, with anxiety: Secondary | ICD-10-CM | POA: Diagnosis present

## 2023-04-09 DIAGNOSIS — Z952 Presence of prosthetic heart valve: Secondary | ICD-10-CM

## 2023-04-09 DIAGNOSIS — G40909 Epilepsy, unspecified, not intractable, without status epilepticus: Secondary | ICD-10-CM

## 2023-04-09 DIAGNOSIS — I4819 Other persistent atrial fibrillation: Secondary | ICD-10-CM

## 2023-04-09 DIAGNOSIS — I480 Paroxysmal atrial fibrillation: Secondary | ICD-10-CM | POA: Diagnosis present

## 2023-04-09 DIAGNOSIS — Z880 Allergy status to penicillin: Secondary | ICD-10-CM

## 2023-04-09 DIAGNOSIS — Z885 Allergy status to narcotic agent status: Secondary | ICD-10-CM

## 2023-04-09 DIAGNOSIS — J410 Simple chronic bronchitis: Secondary | ICD-10-CM

## 2023-04-09 DIAGNOSIS — E785 Hyperlipidemia, unspecified: Secondary | ICD-10-CM | POA: Diagnosis present

## 2023-04-09 DIAGNOSIS — Z888 Allergy status to other drugs, medicaments and biological substances status: Secondary | ICD-10-CM

## 2023-04-09 DIAGNOSIS — I4891 Unspecified atrial fibrillation: Secondary | ICD-10-CM | POA: Diagnosis not present

## 2023-04-09 DIAGNOSIS — J9611 Chronic respiratory failure with hypoxia: Secondary | ICD-10-CM | POA: Diagnosis present

## 2023-04-09 DIAGNOSIS — E860 Dehydration: Secondary | ICD-10-CM | POA: Diagnosis present

## 2023-04-09 DIAGNOSIS — L89309 Pressure ulcer of unspecified buttock, unspecified stage: Secondary | ICD-10-CM | POA: Diagnosis present

## 2023-04-09 DIAGNOSIS — R627 Adult failure to thrive: Principal | ICD-10-CM | POA: Diagnosis present

## 2023-04-09 DIAGNOSIS — R531 Weakness: Secondary | ICD-10-CM | POA: Diagnosis not present

## 2023-04-09 DIAGNOSIS — E876 Hypokalemia: Secondary | ICD-10-CM | POA: Diagnosis not present

## 2023-04-09 DIAGNOSIS — Z8541 Personal history of malignant neoplasm of cervix uteri: Secondary | ICD-10-CM

## 2023-04-09 DIAGNOSIS — R338 Other retention of urine: Secondary | ICD-10-CM | POA: Diagnosis not present

## 2023-04-09 DIAGNOSIS — R0902 Hypoxemia: Secondary | ICD-10-CM | POA: Diagnosis not present

## 2023-04-09 DIAGNOSIS — F419 Anxiety disorder, unspecified: Secondary | ICD-10-CM

## 2023-04-09 DIAGNOSIS — Z833 Family history of diabetes mellitus: Secondary | ICD-10-CM | POA: Diagnosis not present

## 2023-04-09 DIAGNOSIS — Z800 Family history of malignant neoplasm of digestive organs: Secondary | ICD-10-CM

## 2023-04-09 DIAGNOSIS — E8809 Other disorders of plasma-protein metabolism, not elsewhere classified: Secondary | ICD-10-CM | POA: Diagnosis present

## 2023-04-09 DIAGNOSIS — I10 Essential (primary) hypertension: Secondary | ICD-10-CM | POA: Diagnosis present

## 2023-04-09 DIAGNOSIS — Z79899 Other long term (current) drug therapy: Secondary | ICD-10-CM

## 2023-04-09 DIAGNOSIS — J439 Emphysema, unspecified: Secondary | ICD-10-CM | POA: Diagnosis present

## 2023-04-09 DIAGNOSIS — Z66 Do not resuscitate: Secondary | ICD-10-CM | POA: Diagnosis present

## 2023-04-09 DIAGNOSIS — I517 Cardiomegaly: Secondary | ICD-10-CM | POA: Diagnosis not present

## 2023-04-09 DIAGNOSIS — Z743 Need for continuous supervision: Secondary | ICD-10-CM | POA: Diagnosis not present

## 2023-04-09 DIAGNOSIS — Z79890 Hormone replacement therapy (postmenopausal): Secondary | ICD-10-CM

## 2023-04-09 DIAGNOSIS — Z841 Family history of disorders of kidney and ureter: Secondary | ICD-10-CM

## 2023-04-09 DIAGNOSIS — Z8249 Family history of ischemic heart disease and other diseases of the circulatory system: Secondary | ICD-10-CM | POA: Diagnosis not present

## 2023-04-09 DIAGNOSIS — E43 Unspecified severe protein-calorie malnutrition: Secondary | ICD-10-CM | POA: Insufficient documentation

## 2023-04-09 DIAGNOSIS — D649 Anemia, unspecified: Secondary | ICD-10-CM | POA: Diagnosis present

## 2023-04-09 DIAGNOSIS — Z8673 Personal history of transient ischemic attack (TIA), and cerebral infarction without residual deficits: Secondary | ICD-10-CM

## 2023-04-09 DIAGNOSIS — J449 Chronic obstructive pulmonary disease, unspecified: Secondary | ICD-10-CM | POA: Diagnosis present

## 2023-04-09 DIAGNOSIS — Z681 Body mass index (BMI) 19 or less, adult: Secondary | ICD-10-CM

## 2023-04-09 DIAGNOSIS — F0393 Unspecified dementia, unspecified severity, with mood disturbance: Secondary | ICD-10-CM | POA: Diagnosis present

## 2023-04-09 DIAGNOSIS — Z8261 Family history of arthritis: Secondary | ICD-10-CM

## 2023-04-09 DIAGNOSIS — I350 Nonrheumatic aortic (valve) stenosis: Secondary | ICD-10-CM | POA: Diagnosis present

## 2023-04-09 DIAGNOSIS — R918 Other nonspecific abnormal finding of lung field: Secondary | ICD-10-CM | POA: Diagnosis not present

## 2023-04-09 DIAGNOSIS — E039 Hypothyroidism, unspecified: Secondary | ICD-10-CM | POA: Diagnosis present

## 2023-04-09 DIAGNOSIS — R339 Retention of urine, unspecified: Secondary | ICD-10-CM | POA: Diagnosis present

## 2023-04-09 DIAGNOSIS — R079 Chest pain, unspecified: Secondary | ICD-10-CM | POA: Diagnosis not present

## 2023-04-09 DIAGNOSIS — Z803 Family history of malignant neoplasm of breast: Secondary | ICD-10-CM

## 2023-04-09 HISTORY — DX: Anemia, unspecified: D64.9

## 2023-04-09 LAB — CBC WITH DIFFERENTIAL/PLATELET
Abs Immature Granulocytes: 0.04 10*3/uL (ref 0.00–0.07)
Basophils Absolute: 0 10*3/uL (ref 0.0–0.1)
Basophils Relative: 0 %
Eosinophils Absolute: 0.1 10*3/uL (ref 0.0–0.5)
Eosinophils Relative: 1 %
HCT: 26.9 % — ABNORMAL LOW (ref 36.0–46.0)
Hemoglobin: 8.3 g/dL — ABNORMAL LOW (ref 12.0–15.0)
Immature Granulocytes: 0 %
Lymphocytes Relative: 19 %
Lymphs Abs: 2.1 10*3/uL (ref 0.7–4.0)
MCH: 28 pg (ref 26.0–34.0)
MCHC: 30.9 g/dL (ref 30.0–36.0)
MCV: 90.9 fL (ref 80.0–100.0)
Monocytes Absolute: 0.6 10*3/uL (ref 0.1–1.0)
Monocytes Relative: 6 %
Neutro Abs: 7.9 10*3/uL — ABNORMAL HIGH (ref 1.7–7.7)
Neutrophils Relative %: 74 %
Platelets: 393 10*3/uL (ref 150–400)
RBC: 2.96 MIL/uL — ABNORMAL LOW (ref 3.87–5.11)
RDW: 18.6 % — ABNORMAL HIGH (ref 11.5–15.5)
WBC: 10.7 10*3/uL — ABNORMAL HIGH (ref 4.0–10.5)
nRBC: 0 % (ref 0.0–0.2)

## 2023-04-09 LAB — LACTIC ACID, PLASMA: Lactic Acid, Venous: 1.9 mmol/L (ref 0.5–1.9)

## 2023-04-09 LAB — COMPREHENSIVE METABOLIC PANEL
ALT: 13 U/L (ref 0–44)
AST: 17 U/L (ref 15–41)
Albumin: 2 g/dL — ABNORMAL LOW (ref 3.5–5.0)
Alkaline Phosphatase: 115 U/L (ref 38–126)
Anion gap: 8 (ref 5–15)
BUN: 14 mg/dL (ref 8–23)
CO2: 32 mmol/L (ref 22–32)
Calcium: 12.2 mg/dL — ABNORMAL HIGH (ref 8.9–10.3)
Chloride: 95 mmol/L — ABNORMAL LOW (ref 98–111)
Creatinine, Ser: 0.5 mg/dL (ref 0.44–1.00)
GFR, Estimated: >60 mL/min (ref >60)
Glucose, Bld: 114 mg/dL — ABNORMAL HIGH (ref 70–99)
Potassium: 2.3 mmol/L — CL (ref 3.5–5.1)
Sodium: 135 mmol/L (ref 135–145)
Total Bilirubin: 0.7 mg/dL (ref 0.3–1.2)
Total Protein: 6.3 g/dL — ABNORMAL LOW (ref 6.5–8.1)

## 2023-04-09 LAB — TSH: TSH: 11.18 u[IU]/mL — ABNORMAL HIGH (ref 0.350–4.500)

## 2023-04-09 LAB — MAGNESIUM: Magnesium: 1.7 mg/dL (ref 1.7–2.4)

## 2023-04-09 MED ORDER — LEVETIRACETAM 500 MG PO TABS
750.0000 mg | ORAL_TABLET | Freq: Two times a day (BID) | ORAL | Status: DC
  Administered 2023-04-09 – 2023-04-16 (×12): 750 mg via ORAL
  Filled 2023-04-09 (×14): qty 1

## 2023-04-09 MED ORDER — ALBUTEROL SULFATE (2.5 MG/3ML) 0.083% IN NEBU: 2.5000 mg | INHALATION_SOLUTION | Freq: Four times a day (QID) | RESPIRATORY_TRACT | Status: DC | PRN

## 2023-04-09 MED ORDER — ACETAMINOPHEN 650 MG RE SUPP: 650.0000 mg | Freq: Four times a day (QID) | RECTAL | Status: DC | PRN

## 2023-04-09 MED ORDER — BISOPROLOL FUMARATE 5 MG PO TABS
15.0000 mg | ORAL_TABLET | Freq: Every day | ORAL | Status: DC
  Administered 2023-04-10 – 2023-04-16 (×6): 15 mg via ORAL
  Filled 2023-04-09 (×8): qty 3

## 2023-04-09 MED ORDER — BUDESON-GLYCOPYRROL-FORMOTEROL 160-9-4.8 MCG/ACT IN AERO: 2.0000 | INHALATION_SPRAY | Freq: Two times a day (BID) | RESPIRATORY_TRACT | Status: DC

## 2023-04-09 MED ORDER — OXYCODONE HCL 5 MG PO TABS
5.0000 mg | ORAL_TABLET | ORAL | Status: DC | PRN
  Administered 2023-04-09 – 2023-04-16 (×8): 5 mg via ORAL
  Filled 2023-04-09 (×8): qty 1

## 2023-04-09 MED ORDER — ACETAMINOPHEN 325 MG PO TABS: 650.0000 mg | ORAL_TABLET | Freq: Four times a day (QID) | ORAL | Status: DC | PRN

## 2023-04-09 MED ORDER — SENNOSIDES-DOCUSATE SODIUM 8.6-50 MG PO TABS: 1.0000 | ORAL_TABLET | Freq: Every evening | ORAL | Status: DC | PRN

## 2023-04-09 MED ORDER — ONDANSETRON HCL 4 MG/2ML IJ SOLN: 4.0000 mg | Freq: Four times a day (QID) | INTRAMUSCULAR | Status: DC | PRN

## 2023-04-09 MED ORDER — SODIUM CHLORIDE 0.9% FLUSH
3.0000 mL | Freq: Two times a day (BID) | INTRAVENOUS | Status: DC
  Administered 2023-04-09 – 2023-04-16 (×9): 3 mL via INTRAVENOUS

## 2023-04-09 MED ORDER — POTASSIUM CHLORIDE 10 MEQ/100ML IV SOLN
10.0000 meq | Freq: Once | INTRAVENOUS | Status: AC
  Administered 2023-04-09: 10 meq via INTRAVENOUS
  Filled 2023-04-09: qty 100

## 2023-04-09 MED ORDER — ONDANSETRON HCL 4 MG PO TABS: 4.0000 mg | ORAL_TABLET | Freq: Four times a day (QID) | ORAL | Status: DC | PRN

## 2023-04-09 MED ORDER — FLUTICASONE FUROATE-VILANTEROL 100-25 MCG/ACT IN AEPB
1.0000 | INHALATION_SPRAY | Freq: Every day | RESPIRATORY_TRACT | Status: DC
  Administered 2023-04-10 – 2023-04-17 (×8): 1 via RESPIRATORY_TRACT
  Filled 2023-04-09: qty 28

## 2023-04-09 MED ORDER — SODIUM CHLORIDE 0.9 % IV SOLN: INTRAVENOUS | Status: DC

## 2023-04-09 MED ORDER — TRAZODONE HCL 50 MG PO TABS
50.0000 mg | ORAL_TABLET | Freq: Every evening | ORAL | Status: DC | PRN
  Administered 2023-04-09: 50 mg via ORAL
  Filled 2023-04-09: qty 1

## 2023-04-09 MED ORDER — UMECLIDINIUM BROMIDE 62.5 MCG/ACT IN AEPB
1.0000 | INHALATION_SPRAY | Freq: Every day | RESPIRATORY_TRACT | Status: DC
  Administered 2023-04-10 – 2023-04-17 (×8): 1 via RESPIRATORY_TRACT
  Filled 2023-04-09: qty 7

## 2023-04-09 MED ORDER — MAGNESIUM SULFATE 2 GM/50ML IV SOLN
2.0000 g | Freq: Once | INTRAVENOUS | Status: AC
  Administered 2023-04-09: 2 g via INTRAVENOUS
  Filled 2023-04-09: qty 50

## 2023-04-09 MED ORDER — ALBUTEROL SULFATE HFA 108 (90 BASE) MCG/ACT IN AERS: 2.0000 | INHALATION_SPRAY | Freq: Four times a day (QID) | RESPIRATORY_TRACT | Status: DC | PRN

## 2023-04-09 MED ORDER — POTASSIUM CHLORIDE 20 MEQ PO PACK
60.0000 meq | PACK | Freq: Once | ORAL | Status: AC
  Administered 2023-04-09: 60 meq via ORAL
  Filled 2023-04-09: qty 3

## 2023-04-09 MED ORDER — VENLAFAXINE HCL ER 37.5 MG PO CP24
75.0000 mg | ORAL_CAPSULE | Freq: Every day | ORAL | Status: DC
  Administered 2023-04-10 – 2023-04-16 (×6): 75 mg via ORAL
  Filled 2023-04-09 (×10): qty 2

## 2023-04-09 MED ORDER — POTASSIUM CHLORIDE 10 MEQ/100ML IV SOLN
10.0000 meq | INTRAVENOUS | Status: AC
  Administered 2023-04-09 (×3): 10 meq via INTRAVENOUS
  Filled 2023-04-09 (×2): qty 100

## 2023-04-09 NOTE — H&P (Signed)
History and Physical    Tammie Martin:[id number] DOB: [birth year] DOA: 04/09/2023  PCP: Raliegh Ip, DO   Patient coming from: Home   Chief Complaint: Buttock pain, family unable to care for her   HPI: Tammie Martin is a 72 y.o. female with medical history significant for hypertension, hypothyroidism, severe AAS status post TAVR, seizures, depression, COPD, chronic hypoxic respiratory failure, and malnutrition who presents for evaluation of buttock pain related to a pressure sore and family's inability to care for her.  Hospice nurse reports that the patient was enrolled in hospice roughly a year ago due to severe protein calorie malnutrition/failure to thrive.  Patient's daughter has been reportedly calling the hospice agency multiple times daily for the past week asking them to come out and provide basic care for the patient.  Daughter reportedly called today stating that she wants her mother sent to the emergency department and placed in a nursing facility.  Patient reports that she is hungry, thirsty, and experiencing buttock pain at the site of a pressure sore.  She denies chest pain, abdominal pain, nausea, or vomiting.  When EMS arrived at the patient's residence, there was a 74 year old relative of the patient there, but no other adult.  ED Course: Upon arrival to the ED, patient is found to be afebrile and saturating mid 90s on 2 L/min of supplemental oxygen with mild tachycardia and stable blood pressure.  EKG demonstrates atrial fibrillation with repolarization abnormality similar to prior.  Labs are most notable for potassium 2.3, albumin 2.0, TSH 11.180, and hemoglobin 8.3.  Patient was treated with 1 L of saline and IV potassium in the ED.  Review of Systems:  All other systems reviewed and apart from HPI, are negative.  Past Medical History:  Diagnosis Date   Allergy    Anxiety    Atrial fibrillation (HCC)    Cancer (HCC) 1979   cervical cancer    Cataract    COPD (chronic obstructive pulmonary disease) (HCC)    Depression    DJD (degenerative joint disease)    Emphysema of lung (HCC)    Heart murmur    History of cervical cancer    Hyperlipidemia    Hypertension    Hypothyroidism    Normocytic anemia 04/09/2023   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 Excell Seltzer and Dr. Laneta Simmers   Seizures [facility])    Severe aortic stenosis    Stroke [facility])    Substance abuse (HCC)    Thyroid disease    Tobacco abuse     Past Surgical History:  Procedure Laterality Date   AORTIC VALVE REPLACEMENT     APPENDECTOMY     pt recently had CT that said Appendix was there so she is unsure if this was actually removed during hysterectomy as she was previously told   BREAST ENHANCEMENT SURGERY     BREAST SURGERY     CARDIAC VALVE REPLACEMENT     CARDIOVERSION N/A 10/27/2021   Procedure: CARDIOVERSION;  Surgeon: Antoine Poche, MD;  Location: [facility];  Service: Endoscopy;  Laterality: N/A;   INTRAMEDULLARY (IM) NAIL INTERTROCHANTERIC Left 11/03/2022   Procedure: INTRAMEDULLARY (IM) NAIL INTERTROCHANTERIC;  Surgeon: Terance Hart, MD;  Location: [facility] OR;  Service: Orthopedics;  Laterality: Left;   INTRAOPERATIVE TRANSESOPHAGEAL ECHOCARDIOGRAM N/A 02/01/2022   Procedure: INTRAOPERATIVE TRANSESOPHAGEAL ECHOCARDIOGRAM;  Surgeon: Tonny Bollman, MD;  Location: [facility] OR;  Service: Open Heart Surgery;  Laterality: N/A;   RIGHT/LEFT HEART CATH AND CORONARY ANGIOGRAPHY N/A 11/22/2021   Procedure: RIGHT/LEFT HEART CATH AND CORONARY ANGIOGRAPHY;  Surgeon: Tonny Bollman, MD;  Location: [facility] INVASIVE CV LAB;  Service: Cardiovascular;  Laterality: N/A;   TONSILLECTOMY     VAGINAL HYSTERECTOMY      Social History:   reports that she has been smoking cigarettes. She has a 40 pack-year smoking history. She has been exposed to tobacco smoke. She has never used smokeless  tobacco. She reports that she does not currently use alcohol. She reports that she does not use drugs.  Allergies  Allergen Reactions   Calcium Channel Blockers    Montelukast    Penicillins Hives   Singulair [Montelukast Sodium] Other (See Comments)    headache   Codeine Rash   Nicoderm [Nicotine] Hives and Swelling   Other Rash    Band-aids cause rash after a few days   Varenicline Nausea Only    GI intolerance with higher doses    Family History  Problem Relation Age of Onset   Cancer Mother        breast cancer at 35    Kidney disease Mother    Early death Mother    Cancer Maternal Aunt        colon cancer   Heart attack Father    Early death Father    Heart disease Father    Diabetes Sister    Obesity Sister    Depression Sister    Arthritis Sister    Alcohol abuse Brother    Cirrhosis Brother    Depression Brother    Liver disease Brother    Drug abuse Brother    Early death Brother    Diabetes Daughter    Fibromyalgia Daughter    Depression Daughter    Anxiety disorder Daughter    Arthritis Daughter    Depression Sister    Obesity Sister    Diabetes Sister    Early death Sister      Prior to Admission medications   Medication Sig Start Date End Date Taking? Authorizing Provider  albuterol (VENTOLIN HFA) 108 (90 Base) MCG/ACT inhaler Inhale 2 puffs into the lungs every 6 (six) hours as needed for wheezing or shortness of breath. 11/01/21   Raliegh Ip, DO  bisoprolol (ZEBETA) 10 MG tablet Take 1 and 1/2 tablets (15 mg total) by mouth daily. Patient taking differently: Take 15 mg by mouth daily. 09/02/22   Antoine Poche, MD  Budeson-Glycopyrrol-Formoterol (BREZTRI AEROSPHERE) 160-9-4.8 MCG/ACT AERO Inhale 2 puffs into the lungs 2 (two) times daily. 12/09/22   Raliegh Ip, DO  CALCIUM-MAGNESIUM PO Take 1 tablet by mouth daily.    [provider]  cetirizine (ZYRTEC) 10 MG tablet Take 10 mg by mouth daily.    [provider]  Cholecalciferol (VITAMIN D) 50 MCG (2000 UT) tablet Take 2,000 Units by mouth daily.    [provider]  desvenlafaxine (PRISTIQ) 50 MG 24 hr tablet Take 1 tablet (50 mg total) by mouth daily. (NEEDS TO BE SEEN BEFORE NEXT REFILL) 09/26/22   Delynn Flavin M, DO  fluticasone (FLONASE) 50 MCG/ACT nasal spray Place 2 sprays into both nostrils daily as needed for allergies or rhinitis. 10/28/20   Raliegh Ip, DO  furosemide (LASIX) 20 MG tablet TAKE ONE TABLET BY MOUTH DAILY 12/15/22   Delynn Flavin M, DO  HYDROcodone-acetaminophen (NORCO/VICODIN) 5-325 MG tablet Take 1 tablet by mouth  every 6 (six) hours as needed. Patient not taking: Reported on 12/14/2022 11/07/22 11/07/23  [place], Jesse J, PA-C  hydrocortisone (PROCTOSOL HC) 2.5 % rectal cream Place 1 Application rectally 2 (two) times daily. 12/06/22   Raliegh Ip, DO  hydrocortisone cream 1 % Apply 1 application. topically 2 (two) times daily as needed (rash).    [provider]  lamoTRIgine (LAMICTAL) 100 MG tablet Take 100 mg by mouth 2 (two) times daily. Patient not taking: Reported on 12/14/2022 11/24/22   [provider]  lamoTRIgine (LAMICTAL) 25 MG tablet Start with Lamotrigine 25mg  (one pill) at night for one week, then 25mg  (one pill) twice daily for one week, then 25mg  (one pill) morning and 50mg  (two pills) night for one week, then 50mg  (two pills) twice daily for one week then 75mg  (three pills) twice daily for one week then finally 100mg  (fours pills) twice daily Patient not taking: Reported on 12/14/2022 09/12/22   Windell Norfolk, MD  levETIRAcetam (KEPPRA) 750 MG tablet Take 1 tablet (750 mg total) by mouth 2 (two) times daily. 12/21/22 12/16/23  Windell Norfolk, MD  levothyroxine (SYNTHROID) 88 MCG tablet TAKE ONE TABLET BY MOUTH DAILY 09/08/22   Delynn Flavin M, DO  Multiple Vitamins-Minerals (ALIVE WOMENS 50+ PO) Take 1 tablet by mouth daily.    [provider]  Multiple  Vitamins-Minerals (PRESERVISION AREDS PO) Take 1 capsule by mouth 2 (two) times daily.    [provider]  Omega-3-6-9 CAPS Take 1 capsule by mouth 2 (two) times daily.    [provider]  oxyCODONE (ROXICODONE) 5 MG immediate release tablet Take 0.5-1 tablets (2.5-5 mg total) by mouth every 4 (four) hours as needed for severe pain. 12/14/22   Harris, Cammy Copa, PA-C  Polyethyl Glycol-Propyl Glycol (SYSTANE OP) Place 1 drop into both eyes daily as needed (dry eyes).    [provider]  potassium chloride (KLOR-CON) 10 MEQ tablet Take 10 mEq by mouth daily. 11/24/22   [provider]  Probiotic Product (PROBIOTIC ADVANCED PO) Take 1 capsule by mouth daily.    [provider]  traZODone (DESYREL) 50 MG tablet Take 1 tablet (50 mg total) by mouth at bedtime as needed for sleep. 03/18/22   Delynn Flavin M, DO  TURMERIC PO Take 1 capsule by mouth 2 (two) times daily.    [provider]    Physical Exam: Vitals:   04/09/23 1825 04/09/23 1830 04/09/23 1845 04/09/23 2021  BP: 103/74 107/74 101/68   Pulse: 95 (!) 114 (!) 109   Resp: 19 19 17    Temp:  97.8 F (36.6 C)  97.6 F (36.4 C)  TempSrc:    Oral  SpO2: 97% 96% 97% 99%     Constitutional: NAD, no pallor or diaphoresis   Eyes: PERTLA, lids and conjunctivae normal ENMT: Mucous membranes are moist. Posterior pharynx clear of any exudate or lesions.   Neck: supple, no masses  Respiratory:  no wheezing, no crackles. No accessory muscle use.  Cardiovascular: Rate ~100 and irregularly irregular. No extremity edema.  Abdomen: No distension, no tenderness, soft. Bowel sounds active.  Musculoskeletal: no clubbing / cyanosis. No joint deformity upper and lower extremities.   Skin: no significant rashes, lesions, or ulcers on exposed surfaces. Warm, dry, well-perfused. Neurologic: CN 2-12 grossly intact. Moving all extremities. Alert and oriented.  Psychiatric: Calm. Cooperative.    Labs and  Imaging on Admission: I have personally reviewed following labs and imaging studies  CBC: Recent Labs  Lab  04/09/23 1844  WBC 10.7*  NEUTROABS 7.9*  HGB 8.3*  HCT 26.9*  MCV 90.9  PLT 393   Basic Metabolic Panel: Recent Labs  Lab 04/09/23 1844  NA 135  K 2.3*  CL 95*  CO2 32  GLUCOSE 114*  BUN 14  CREATININE 0.50  CALCIUM 12.2*  MG 1.7   GFR: CrCl cannot be calculated (Unknown ideal weight.). Liver Function Tests: Recent Labs  Lab 04/09/23 1844  AST 17  ALT 13  ALKPHOS 115  BILITOT 0.7  PROT 6.3*  ALBUMIN 2.0*   No results for input(s): "LIPASE", "AMYLASE" in the last 168 hours. No results for input(s): "AMMONIA" in the last 168 hours. Coagulation Profile: No results for input(s): "INR", "PROTIME" in the last 168 hours. Cardiac Enzymes: No results for input(s): "CKTOTAL", "CKMB", "CKMBINDEX", "TROPONINI" in the last 168 hours. BNP (last 3 results) No results for input(s): "PROBNP" in the last 8760 hours. HbA1C: No results for input(s): "HGBA1C" in the last 72 hours. CBG: No results for input(s): "GLUCAP" in the last 168 hours. Lipid Profile: No results for input(s): "CHOL", "HDL", "LDLCALC", "TRIG", "CHOLHDL", "LDLDIRECT" in the last 72 hours. Thyroid Function Tests: Recent Labs    04/09/23 1844  TSH 11.180*   Anemia Panel: No results for input(s): "VITAMINB12", "FOLATE", "FERRITIN", "TIBC", "IRON", "RETICCTPCT" in the last 72 hours. Urine analysis:    Component Value Date/Time   COLORURINE YELLOW 12/14/2022 2201   APPEARANCEUR CLEAR 12/14/2022 2201   LABSPEC 1.020 12/14/2022 2201   PHURINE 5.0 12/14/2022 2201   GLUCOSEU NEGATIVE 12/14/2022 2201   HGBUR NEGATIVE 12/14/2022 2201   BILIRUBINUR NEGATIVE 12/14/2022 2201   KETONESUR NEGATIVE 12/14/2022 2201   PROTEINUR 30 (A) 12/14/2022 2201   NITRITE NEGATIVE 12/14/2022 2201   LEUKOCYTESUR NEGATIVE 12/14/2022 2201   Sepsis Labs: @LABRCNTIP (procalcitonin:4,lacticidven:4) )No results found for  this or any previous visit (from the past 240 hour(s)).   Radiological Exams on Admission: DG Chest Port 1 View  Result Date: 04/09/2023 CLINICAL DATA:  Weakness EXAM: PORTABLE CHEST 1 VIEW COMPARISON:  Chest radiograph dated 02/12/2023 FINDINGS: Patient is rotated slightly to the right. Normal lung volumes. Improved right retrocardiac opacity with a small focus of residual atelectasis. No pleural effusion or pneumothorax. Decreased cardiomegaly. No acute osseous abnormality. IMPRESSION: 1. Improved right retrocardiac opacity with a small focus of residual atelectasis. 2. Decreased cardiomegaly. Electronically Signed   By: Agustin Cree M.D.   On: 04/09/2023 19:09    EKG: Independently reviewed. Atrial fibrillation, repolarization abnormality similar to prior.   Assessment/Plan   1. Failure to thrive  - Pt has been on home hospice for FTT but family wants her placed in nursing facility d/t inability to care for her at home   - Consult TOC    2. Hypokalemia  - Continue replacement, repeat chem panel in am   3. Hypercalcemia  - Likely d/t dehydration, appears to be asymptomatic  - Check PTH and ionized calcium, continue IVF hydration, repeat chemistry panel in am    4. Atrial fibrillation  - Patient states she no longer takes Eliquis  - Continue beta-blocker    5. COPD; chronic hypoxic respiratory failure  - Continue supplemental O2, ICS-LAMA-LABA, and as-needed albuterol    6. Seizures  - Continue Keppra   7. Elevated TSH  - Previously on Synthroid, patient does not believe she takes it currently  - Check free T4    8. Anemia  - No overt bleeding, monitor    9. Depression  -  Continue SNRI    DVT prophylaxis: SCDs  Code Status: DNR  Level of Care: Level of care: Telemetry Family Communication: none available at time of admission  Disposition Plan:  Patient is from: Home  Anticipated d/c is to: SNF  Anticipated d/c date is: 8/12 or 04/11/23  Patient currently: Pending TOC  consultation, correction of electrolytes  Consults called: None  Admission status: Observation     Briscoe Deutscher, MD [facility]  04/09/2023, 8:36 PM

## 2023-04-09 NOTE — ED Provider Notes (Signed)
[place] EMERGENCY DEPARTMENT AT [facility] Provider Note   CSN: [id number] Arrival date & time: 04/09/23  1812     History  Chief Complaint  Patient presents with   Dehydration    Tammie Martin is a 72 y.o. female.  HPI   This patient is a send 72 year old female, she is currently on hospice care because of severe protein calorie malnutrition, she is essentially bedbound, evidently she had a family member that called for hospice earlier today to come and take care of her, the family wants someone to be in the house 24 hours a day as they are not able to take care of her, hospice sent somebody out to take a look and found a 72 year old girl taking care of her but no other adults in the house.  Evidently somebody came over while the hospice nurse was there and was cursing and yelling at them about not taking care of their mother.  The patient does not have any complaints including chest pain shortness of breath or fevers.  She was noted to be dehydrated by paramedics who noted her to be slightly hypotensive and have a dry mouth, they gave 500 cc of normal saline prehospital.  Evidently she does have paroxysmal atrial fibrillation, she does not have an anticoagulant based on her notes, she does take medicine for seizures including Lamictal and Keppra.  The patient does have some dementia and is not able to answer any other questions for me.  Home Medications Prior to Admission medications   Medication Sig Start Date End Date Taking? Authorizing Provider  albuterol (VENTOLIN HFA) 108 (90 Base) MCG/ACT inhaler Inhale 2 puffs into the lungs every 6 (six) hours as needed for wheezing or shortness of breath. 11/01/21   Raliegh Ip, DO  bisoprolol (ZEBETA) 10 MG tablet Take 1 and 1/2 tablets (15 mg total) by mouth daily. Patient taking differently: Take 15 mg by mouth daily. 09/02/22   Antoine Poche, MD  Budeson-Glycopyrrol-Formoterol (BREZTRI AEROSPHERE) 160-9-4.8  MCG/ACT AERO Inhale 2 puffs into the lungs 2 (two) times daily. 12/09/22   Raliegh Ip, DO  CALCIUM-MAGNESIUM PO Take 1 tablet by mouth daily.    [provider]  cetirizine (ZYRTEC) 10 MG tablet Take 10 mg by mouth daily.    [provider]  Cholecalciferol (VITAMIN D) 50 MCG (2000 UT) tablet Take 2,000 Units by mouth daily.    [provider]  desvenlafaxine (PRISTIQ) 50 MG 24 hr tablet Take 1 tablet (50 mg total) by mouth daily. (NEEDS TO BE SEEN BEFORE NEXT REFILL) 09/26/22   Delynn Flavin M, DO  fluticasone (FLONASE) 50 MCG/ACT nasal spray Place 2 sprays into both nostrils daily as needed for allergies or rhinitis. 10/28/20   Raliegh Ip, DO  furosemide (LASIX) 20 MG tablet TAKE ONE TABLET BY MOUTH DAILY 12/15/22   Delynn Flavin M, DO  HYDROcodone-acetaminophen (NORCO/VICODIN) 5-325 MG tablet Take 1 tablet by mouth every 6 (six) hours as needed. Patient not taking: Reported on 12/14/2022 11/07/22 11/07/23  [place], Jesse J, PA-C  hydrocortisone (PROCTOSOL HC) 2.5 % rectal cream Place 1 Application rectally 2 (two) times daily. 12/06/22   Raliegh Ip, DO  hydrocortisone cream 1 % Apply 1 application. topically 2 (two) times daily as needed (rash).    [provider]  lamoTRIgine (LAMICTAL) 100 MG tablet Take 100 mg by mouth 2 (two) times daily. Patient not taking: Reported on 12/14/2022 11/24/22   [provider]  lamoTRIgine (LAMICTAL) 25 MG tablet Start with Lamotrigine 25mg  (one pill) at night for one week, then 25mg  (one pill) twice daily for one week, then 25mg  (one pill) morning and 50mg  (two pills) night for one week, then 50mg  (two pills) twice daily for one week then 75mg  (three pills) twice daily for one week then finally 100mg  (fours pills) twice daily Patient not taking: Reported on 12/14/2022 09/12/22   Windell Norfolk, MD  levETIRAcetam (KEPPRA) 750 MG tablet Take 1 tablet (750 mg total) by mouth 2 (two) times daily.  12/21/22 12/16/23  Windell Norfolk, MD  levothyroxine (SYNTHROID) 88 MCG tablet TAKE ONE TABLET BY MOUTH DAILY 09/08/22   Delynn Flavin M, DO  Multiple Vitamins-Minerals (ALIVE WOMENS 50+ PO) Take 1 tablet by mouth daily.    [provider]  Multiple Vitamins-Minerals (PRESERVISION AREDS PO) Take 1 capsule by mouth 2 (two) times daily.    [provider]  Omega-3-6-9 CAPS Take 1 capsule by mouth 2 (two) times daily.    [provider]  oxyCODONE (ROXICODONE) 5 MG immediate release tablet Take 0.5-1 tablets (2.5-5 mg total) by mouth every 4 (four) hours as needed for severe pain. 12/14/22   Harris, Cammy Copa, PA-C  Polyethyl Glycol-Propyl Glycol (SYSTANE OP) Place 1 drop into both eyes daily as needed (dry eyes).    [provider]  potassium chloride (KLOR-CON) 10 MEQ tablet Take 10 mEq by mouth daily. 11/24/22   [provider]  Probiotic Product (PROBIOTIC ADVANCED PO) Take 1 capsule by mouth daily.    [provider]  traZODone (DESYREL) 50 MG tablet Take 1 tablet (50 mg total) by mouth at bedtime as needed for sleep. 03/18/22   Delynn Flavin M, DO  TURMERIC PO Take 1 capsule by mouth 2 (two) times daily.    [provider]      Allergies    Calcium channel blockers, Montelukast, Penicillins, Singulair [montelukast sodium], Codeine, Nicoderm [nicotine], Other, and Varenicline    Review of Systems   Review of Systems  All other systems reviewed and are negative.   Physical Exam Updated Vital Signs BP 101/68   Pulse (!) 109   Temp 97.8 F (36.6 C)   Resp 17   SpO2 97%  Physical Exam Vitals and nursing note reviewed.  Constitutional:      General: She is not in acute distress.    Appearance: She is well-developed.  HENT:     Head: Normocephalic and atraumatic.     Mouth/Throat:     Mouth: Mucous membranes are dry.     Pharynx: No oropharyngeal exudate.  Eyes:     General: No scleral icterus.       Right eye: No  discharge.        Left eye: No discharge.     Conjunctiva/sclera: Conjunctivae normal.     Pupils: Pupils are equal, round, and reactive to light.  Neck:     Thyroid: No thyromegaly.     Vascular: No JVD.  Cardiovascular:     Rate and Rhythm: Normal rate and regular rhythm.     Heart sounds: Normal heart sounds. No murmur heard.    No friction rub. No gallop.  Pulmonary:     Effort: Pulmonary effort is normal. No respiratory distress.     Breath sounds: Normal breath sounds. No wheezing or rales.  Abdominal:     General: Bowel sounds are normal. There is no distension.     Palpations: Abdomen is soft. There is no  mass.     Tenderness: There is no abdominal tenderness.  Genitourinary:    Comments: Normal-appearing rectal exam, no fissures, no hemorrhoids, no sacral decubiti Musculoskeletal:        General: No tenderness. Normal range of motion.     Cervical back: Normal range of motion and neck supple.     Right lower leg: No edema.     Left lower leg: No edema.  Lymphadenopathy:     Cervical: No cervical adenopathy.  Skin:    General: Skin is warm and dry.     Findings: No erythema or rash.  Neurological:     Mental Status: She is alert.     Coordination: Coordination normal.  Psychiatric:        Behavior: Behavior normal.     ED Results / Procedures / Treatments   Labs (all labs ordered are listed, but only abnormal results are displayed) Labs Reviewed  TSH - Abnormal; Notable for the following components:      Result Value   TSH 11.180 (*)    All other components within normal limits  CBC WITH DIFFERENTIAL/PLATELET - Abnormal; Notable for the following components:   WBC 10.7 (*)    RBC 2.96 (*)    Hemoglobin 8.3 (*)    HCT 26.9 (*)    RDW 18.6 (*)    Neutro Abs 7.9 (*)    All other components within normal limits  COMPREHENSIVE METABOLIC PANEL - Abnormal; Notable for the following components:   Potassium 2.3 (*)    Chloride 95 (*)    Glucose, Bld 114 (*)     Calcium 12.2 (*)    Total Protein 6.3 (*)    Albumin 2.0 (*)    All other components within normal limits  MAGNESIUM  LACTIC ACID, PLASMA  URINALYSIS, ROUTINE W REFLEX MICROSCOPIC  LACTIC ACID, PLASMA    EKG EKG Interpretation Date/Time:  Sunday April 09 2023 18:33:43 EDT Ventricular Rate:  92 PR Interval:    QRS Duration:  95 QT Interval:  326 QTC Calculation: 404 R Axis:   74  Text Interpretation: Atrial fibrillation Repol abnrm, severe global ischemia (LM/MVD) similar to multiple prior EKG's Confirmed by Eber Hong ([id number]) on 04/09/2023 7:14:06 PM  Radiology DG Chest Port 1 View  Result Date: 04/09/2023 CLINICAL DATA:  Weakness EXAM: PORTABLE CHEST 1 VIEW COMPARISON:  Chest radiograph dated 02/12/2023 FINDINGS: Patient is rotated slightly to the right. Normal lung volumes. Improved right retrocardiac opacity with a small focus of residual atelectasis. No pleural effusion or pneumothorax. Decreased cardiomegaly. No acute osseous abnormality. IMPRESSION: 1. Improved right retrocardiac opacity with a small focus of residual atelectasis. 2. Decreased cardiomegaly. Electronically Signed   By: Agustin Cree M.D.   On: 04/09/2023 19:09    Procedures .Critical Care  Performed by: Eber Hong, MD Authorized by: Eber Hong, MD   Critical care provider statement:    Critical care time (minutes):  45   Critical care time was exclusive of:  Separately billable procedures and treating other patients   Critical care was necessary to treat or prevent imminent or life-threatening deterioration of the following conditions:  Endocrine crisis   Critical care was time spent personally by me on the following activities:  Development of treatment plan with patient or surrogate, discussions with consultants, evaluation of patient's response to treatment, examination of patient, obtaining history from patient or surrogate, review of old charts, re-evaluation of patient's condition, pulse oximetry,  ordering and review of radiographic studies, ordering  and review of laboratory studies and ordering and performing treatments and interventions   I assumed direction of critical care for this patient from another provider in my specialty: no     Care discussed with: admitting provider   Comments:     Severe protein calorie malnutrition Dehydration Severe hypokalemia      Medications Ordered in ED Medications  0.9 %  sodium chloride infusion ( Intravenous New Bag/Given 04/09/23 1843)  potassium chloride 10 mEq in 100 mL IVPB (10 mEq Intravenous New Bag/Given 04/09/23 1955)    ED Course/ Medical Decision Making/ A&P                                 Medical Decision Making Amount and/or Complexity of Data Reviewed Labs: ordered. Radiology: ordered. ECG/medicine tests: ordered.  Risk Prescription drug management. Decision regarding hospitalization.   Overall this patient appears chronically ill and malnutrition, she does not appear acutely ill, will check labs and urine, chest x-ray and EKG.  I have discussed the care with the hospice nurse, they state that they are trying to get her placed but they have not yet found a place and they state it takes time which the family is not willing to wait for.  It appears that the patient is not adequately being cared for at home   Co morbidities that complicate the patient evaluation  Severe protein calorie malnutrition   Additional history obtained:  Additional history obtained from medical record External records from outside source obtained and reviewed including prior visits for COPD, palliative care, no recent admissions to the hospital since March of this year after having a hip fracture   Lab Tests:  I Ordered, and personally interpreted labs.  The pertinent results include: Severe hypokalemia, albumin of 2.0   Imaging Studies ordered:  I ordered imaging studies including chest x-ray I independently visualized and  interpreted imaging which showed no acute findings I agree with the radiologist interpretation   Cardiac Monitoring: / EKG:  The patient was maintained on a cardiac monitor.  I personally viewed and interpreted the cardiac monitored which showed an underlying rhythm of: Atrial fibrillation   Consultations Obtained:  I requested consultation with the hospitalist Dr. Antionette Char,  and discussed lab and imaging findings as well as pertinent plan - they recommend: Admit to the hospital   Problem List / ED Course / Critical interventions / Medication management  Patient given IV fluid resuscitation as well as potassium supplementation for severe hypokalemia.  There are some EKG changes that were concerning in the presence of hypokalemia but no signs of torsades I ordered medication including potassium supplementation by IV and normal saline for severe hypokalemia Reevaluation of the patient after these medicines showed that the patient no change I have reviewed the patients home medicines and have made adjustments as needed   Social Determinants of Health:  Severe decompensation and failure to thrive   Test / Admission - Considered:  Admit to the hospital         Final Clinical Impression(s) / ED Diagnoses Final diagnoses:  Hypokalemia  Dehydration  Severe protein-calorie malnutrition (HCC)    Rx / DC Orders ED Discharge Orders     None         Eber Hong, MD 04/09/23 2000

## 2023-04-09 NOTE — ED Triage Notes (Signed)
BIBA from home on hospice care.  EMS reports called out for cp and on arrival patient and 72 year old the only ones at home. Hospice nurse reports daughter is wanting patient placed due to no longer able to care for her at home. Pt denies all pain on arrival.  DNR paper on arrival.  Hemoccult positive noted on arrival.

## 2023-04-09 NOTE — ED Notes (Signed)
ED TO INPATIENT HANDOFF REPORT  ED Nurse Name and Phone #: [phone]  S Name/Age/Gender Tammie Martin 72 y.o. female Room/Bed: APA03/APA03  Code Status   Code Status: Prior  Home/SNF/Other Home Patient oriented to: self and place Is this baseline? Yes   Triage Complete: Triage complete  Chief Complaint Failure to thrive in adult [R62.7]  Triage Note BIBA from home on hospice care.  EMS reports called out for cp and on arrival patient and 72 year old the only ones at home. Hospice nurse reports daughter is wanting patient placed due to no longer able to care for her at home. Pt denies all pain on arrival.  DNR paper on arrival.  Hemoccult positive noted on arrival.    Allergies Allergies  Allergen Reactions   Calcium Channel Blockers    Montelukast    Penicillins Hives   Singulair [Montelukast Sodium] Other (See Comments)    headache   Codeine Rash   Nicoderm [Nicotine] Hives and Swelling   Other Rash    Band-aids cause rash after a few days   Varenicline Nausea Only    GI intolerance with higher doses    Level of Care/Admitting Diagnosis ED Disposition     ED Disposition  Admit   Condition  --   Comment  Hospital Area: [facility] [[id number]]  Level of Care: Telemetry [5]  Covid Evaluation: Asymptomatic - no recent exposure (last 10 days) testing not required  Diagnosis: Failure to thrive in adult [358490]  Admitting Physician: Briscoe Deutscher [[id number]]  Attending Physician: Briscoe Deutscher [[id number]]          B Medical/Surgery History Past Medical History:  Diagnosis Date   Allergy    Anxiety    Atrial fibrillation (HCC)    Cancer (HCC) 1979   cervical cancer   Cataract    COPD (chronic obstructive pulmonary disease) (HCC)    Depression    DJD (degenerative joint disease)    Emphysema of lung (HCC)    Heart murmur    History of cervical cancer    Hyperlipidemia    Hypertension    Hypothyroidism    Normocytic anemia 04/09/2023    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 Excell Seltzer and Dr. Laneta Simmers   Seizures [facility])    Severe aortic stenosis    Stroke [facility])    Substance abuse (HCC)    Thyroid disease    Tobacco abuse    Past Surgical History:  Procedure Laterality Date   AORTIC VALVE REPLACEMENT     APPENDECTOMY     pt recently had CT that said Appendix was there so she is unsure if this was actually removed during hysterectomy as she was previously told   BREAST ENHANCEMENT SURGERY     BREAST SURGERY     CARDIAC VALVE REPLACEMENT     CARDIOVERSION N/A 10/27/2021   Procedure: CARDIOVERSION;  Surgeon: Antoine Poche, MD;  Location: [facility];  Service: Endoscopy;  Laterality: N/A;   INTRAMEDULLARY (IM) NAIL INTERTROCHANTERIC Left 11/03/2022   Procedure: INTRAMEDULLARY (IM) NAIL INTERTROCHANTERIC;  Surgeon: Terance Hart, MD;  Location[facility] OR;  Service: Orthopedics;  Laterality: Left;   INTRAOPERATIVE TRANSESOPHAGEAL ECHOCARDIOGRAM N/A 02/01/2022   Procedure: INTRAOPERATIVE TRANSESOPHAGEAL ECHOCARDIOGRAM;  Surgeon: Tonny Bollman, MD;  Location: [facility] OR;  Service: Open Heart Surgery;  Laterality: N/A;   RIGHT/LEFT HEART CATH AND CORONARY ANGIOGRAPHY N/A 11/22/2021  Procedure: RIGHT/LEFT HEART CATH AND CORONARY ANGIOGRAPHY;  Surgeon: Tonny Bollman, MD;  Location: [facility] INVASIVE CV LAB;  Service: Cardiovascular;  Laterality: N/A;   TONSILLECTOMY     VAGINAL HYSTERECTOMY       A IV Location/Drains/Wounds Patient Lines/Drains/Airways Status     Active Line/Drains/Airways     Name Placement date Placement time Site Days   Peripheral IV 04/09/23 20 G Right Forearm 04/09/23  1843  Forearm  less than 1            Intake/Output Last 24 hours No intake or output data in the 24 hours ending 04/09/23 2026  Labs/Imaging Results for orders placed or performed during the hospital encounter of 04/09/23 (from the past  48 hour(s))  TSH     Status: Abnormal   Collection Time: 04/09/23  6:44 PM  Result Value Ref Range   TSH 11.180 (H) 0.350 - 4.500 uIU/mL    Comment: Performed by a 3rd Generation assay with a functional sensitivity of <=0.01 uIU/mL. Performed at [facility], [address], [place], [place] [address]   CBC with Differential     Status: Abnormal   Collection Time: 04/09/23  6:44 PM  Result Value Ref Range   WBC 10.7 (H) 4.0 - 10.5 K/uL   RBC 2.96 (L) 3.87 - 5.11 MIL/uL   Hemoglobin 8.3 (L) 12.0 - 15.0 g/dL   HCT 60.4 (L) 54.0 - 98.1 %   MCV 90.9 80.0 - 100.0 fL   MCH 28.0 26.0 - 34.0 pg   MCHC 30.9 30.0 - 36.0 g/dL   RDW 19.1 (H) 47.8 - 29.5 %   Platelets 393 150 - 400 K/uL   nRBC 0.0 0.0 - 0.2 %   Neutrophils Relative % 74 %   Neutro Abs 7.9 (H) 1.7 - 7.7 K/uL   Lymphocytes Relative 19 %   Lymphs Abs 2.1 0.7 - 4.0 K/uL   Monocytes Relative 6 %   Monocytes Absolute 0.6 0.1 - 1.0 K/uL   Eosinophils Relative 1 %   Eosinophils Absolute 0.1 0.0 - 0.5 K/uL   Basophils Relative 0 %   Basophils Absolute 0.0 0.0 - 0.1 K/uL   Immature Granulocytes 0 %   Abs Immature Granulocytes 0.04 0.00 - 0.07 K/uL    Comment: Performed at [facility], [address], [place], [place] [address]  Comprehensive metabolic panel     Status: Abnormal   Collection Time: 04/09/23  6:44 PM  Result Value Ref Range   Sodium 135 135 - 145 mmol/L   Potassium 2.3 (LL) 3.5 - 5.1 mmol/L    Comment: CRITICAL RESULT CALLED TO, READ BACK BY AND VERIFIED WITH KRISTIE BELTON 1935 04/09/23 NN   Chloride 95 (L) 98 - 111 mmol/L   CO2 32 22 - 32 mmol/L   Glucose, Bld 114 (H) 70 - 99 mg/dL    Comment: Glucose reference range applies only to samples taken after fasting for at least 8 hours.   BUN 14 8 - 23 mg/dL   Creatinine, Ser 8.65 0.44 - 1.00 mg/dL   Calcium 78.4 (H) 8.9 - 10.3 mg/dL   Total Protein 6.3 (L) 6.5 - 8.1 g/dL   Albumin 2.0 (L) 3.5 - 5.0 g/dL   AST 17 15 - 41 U/L   ALT 13 0 - 44 U/L   Alkaline  Phosphatase 115 38 - 126 U/L   Total Bilirubin 0.7 0.3 - 1.2 mg/dL   GFR, Estimated >69 >62 mL/min    Comment: (NOTE) Calculated  using the CKD-EPI Creatinine Equation (2021)    Anion gap 8 5 - 15    Comment: Performed at [facility], [address], [place], [place] [address]  Magnesium     Status: None   Collection Time: 04/09/23  6:44 PM  Result Value Ref Range   Magnesium 1.7 1.7 - 2.4 mg/dL    Comment: Performed at [facility], [address] [place], [place] [address]  Lactic acid, plasma     Status: None   Collection Time: 04/09/23  6:44 PM  Result Value Ref Range   Lactic Acid, Venous 1.9 0.5 - 1.9 mmol/L    Comment: Performed at [facility], [address], [place], [place] [address]   DG Chest Port 1 View  Result Date: 04/09/2023 CLINICAL DATA:  Weakness EXAM: PORTABLE CHEST 1 VIEW COMPARISON:  Chest radiograph dated 02/12/2023 FINDINGS: Patient is rotated slightly to the right. Normal lung volumes. Improved right retrocardiac opacity with a small focus of residual atelectasis. No pleural effusion or pneumothorax. Decreased cardiomegaly. No acute osseous abnormality. IMPRESSION: 1. Improved right retrocardiac opacity with a small focus of residual atelectasis. 2. Decreased cardiomegaly. Electronically Signed   By: Agustin Cree M.D.   On: 04/09/2023 19:09    Pending Labs Unresulted Labs (From admission, onward)     Start     Ordered   04/09/23 1830  Lactic acid, plasma  (Lactic Acid)  Now then every 2 hours,   R (with STAT occurrences)      04/09/23 1829   04/09/23 1829  Urinalysis, Routine w reflex microscopic -Urine, Catheterized  Once,   URGENT       Question:  Specimen Source  Answer:  Urine, Catheterized   04/09/23 1829            Vitals/Pain Today's Vitals   04/09/23 1830 04/09/23 1835 04/09/23 1845 04/09/23 2021  BP: 107/74  101/68   Pulse: (!) 114  (!) 109   Resp: 19  17   Temp: 97.8 F (36.6 C)   97.6 F (36.4 C)  TempSrc:    Oral  SpO2: 96%  97% 99%   PainSc:  0-No pain      Isolation Precautions No active isolations  Medications Medications  0.9 %  sodium chloride infusion ( Intravenous New Bag/Given 04/09/23 1843)  potassium chloride 10 mEq in 100 mL IVPB (10 mEq Intravenous New Bag/Given 04/09/23 1955)    Mobility non-ambulatory     Focused Assessments   R Recommendations: See Admitting Provider Note  Report given to:   Additional Notes:

## 2023-04-09 NOTE — Plan of Care (Signed)

## 2023-04-10 DIAGNOSIS — F17210 Nicotine dependence, cigarettes, uncomplicated: Secondary | ICD-10-CM | POA: Diagnosis present

## 2023-04-10 DIAGNOSIS — Z66 Do not resuscitate: Secondary | ICD-10-CM | POA: Diagnosis present

## 2023-04-10 DIAGNOSIS — Z833 Family history of diabetes mellitus: Secondary | ICD-10-CM | POA: Diagnosis not present

## 2023-04-10 DIAGNOSIS — E876 Hypokalemia: Secondary | ICD-10-CM | POA: Diagnosis present

## 2023-04-10 DIAGNOSIS — G40909 Epilepsy, unspecified, not intractable, without status epilepticus: Secondary | ICD-10-CM | POA: Diagnosis present

## 2023-04-10 DIAGNOSIS — R338 Other retention of urine: Secondary | ICD-10-CM | POA: Diagnosis not present

## 2023-04-10 DIAGNOSIS — E785 Hyperlipidemia, unspecified: Secondary | ICD-10-CM | POA: Diagnosis present

## 2023-04-10 DIAGNOSIS — E8809 Other disorders of plasma-protein metabolism, not elsewhere classified: Secondary | ICD-10-CM | POA: Diagnosis present

## 2023-04-10 DIAGNOSIS — R627 Adult failure to thrive: Secondary | ICD-10-CM | POA: Diagnosis present

## 2023-04-10 DIAGNOSIS — F0393 Unspecified dementia, unspecified severity, with mood disturbance: Secondary | ICD-10-CM | POA: Diagnosis present

## 2023-04-10 DIAGNOSIS — F32A Depression, unspecified: Secondary | ICD-10-CM | POA: Diagnosis present

## 2023-04-10 DIAGNOSIS — L89309 Pressure ulcer of unspecified buttock, unspecified stage: Secondary | ICD-10-CM | POA: Diagnosis present

## 2023-04-10 DIAGNOSIS — Z515 Encounter for palliative care: Secondary | ICD-10-CM | POA: Diagnosis not present

## 2023-04-10 DIAGNOSIS — Z8249 Family history of ischemic heart disease and other diseases of the circulatory system: Secondary | ICD-10-CM | POA: Diagnosis not present

## 2023-04-10 DIAGNOSIS — E860 Dehydration: Secondary | ICD-10-CM | POA: Diagnosis present

## 2023-04-10 DIAGNOSIS — I480 Paroxysmal atrial fibrillation: Secondary | ICD-10-CM | POA: Diagnosis present

## 2023-04-10 DIAGNOSIS — Z681 Body mass index (BMI) 19 or less, adult: Secondary | ICD-10-CM | POA: Diagnosis not present

## 2023-04-10 DIAGNOSIS — I10 Essential (primary) hypertension: Secondary | ICD-10-CM | POA: Diagnosis present

## 2023-04-10 DIAGNOSIS — Z7189 Other specified counseling: Secondary | ICD-10-CM | POA: Diagnosis not present

## 2023-04-10 DIAGNOSIS — F0394 Unspecified dementia, unspecified severity, with anxiety: Secondary | ICD-10-CM | POA: Diagnosis present

## 2023-04-10 DIAGNOSIS — E43 Unspecified severe protein-calorie malnutrition: Secondary | ICD-10-CM | POA: Diagnosis present

## 2023-04-10 DIAGNOSIS — J9611 Chronic respiratory failure with hypoxia: Secondary | ICD-10-CM | POA: Diagnosis present

## 2023-04-10 DIAGNOSIS — I350 Nonrheumatic aortic (valve) stenosis: Secondary | ICD-10-CM | POA: Diagnosis present

## 2023-04-10 DIAGNOSIS — E039 Hypothyroidism, unspecified: Secondary | ICD-10-CM | POA: Diagnosis present

## 2023-04-10 DIAGNOSIS — J439 Emphysema, unspecified: Secondary | ICD-10-CM | POA: Diagnosis present

## 2023-04-10 MED ORDER — POTASSIUM CHLORIDE 20 MEQ PO PACK
60.0000 meq | PACK | Freq: Once | ORAL | Status: AC
  Administered 2023-04-10: 60 meq via ORAL
  Filled 2023-04-10: qty 3

## 2023-04-10 MED ORDER — LEVOTHYROXINE SODIUM 25 MCG PO TABS
25.0000 ug | ORAL_TABLET | Freq: Every day | ORAL | Status: DC
  Administered 2023-04-11 – 2023-04-17 (×5): 25 ug via ORAL
  Filled 2023-04-10 (×5): qty 1

## 2023-04-10 MED ORDER — ADULT MULTIVITAMIN W/MINERALS CH
1.0000 | ORAL_TABLET | Freq: Every day | ORAL | Status: DC
  Administered 2023-04-10 – 2023-04-16 (×6): 1 via ORAL
  Filled 2023-04-10 (×8): qty 1

## 2023-04-10 MED ORDER — ZINC SULFATE 220 (50 ZN) MG PO CAPS
220.0000 mg | ORAL_CAPSULE | Freq: Every day | ORAL | Status: DC
  Administered 2023-04-10 – 2023-04-16 (×6): 220 mg via ORAL
  Filled 2023-04-10 (×8): qty 1

## 2023-04-10 MED ORDER — VITAMIN C 500 MG PO TABS
500.0000 mg | ORAL_TABLET | Freq: Two times a day (BID) | ORAL | Status: DC
  Administered 2023-04-10 – 2023-04-16 (×11): 500 mg via ORAL
  Filled 2023-04-10 (×13): qty 1

## 2023-04-10 MED ORDER — ENSURE ENLIVE PO LIQD
237.0000 mL | Freq: Three times a day (TID) | ORAL | Status: DC
  Administered 2023-04-10 – 2023-04-16 (×12): 237 mL via ORAL

## 2023-04-10 MED ORDER — LACTATED RINGERS IV SOLN: INTRAVENOUS | Status: DC

## 2023-04-10 NOTE — Progress Notes (Signed)
[facility] PROGRESS NOTE  JOSIANE REIFER (DOB: 1951-02-27) UJW:[id number] PCP: Raliegh Ip, DO  Brief Narrative: Tammie Martin is a 72 y.o. female with a history of COPD with chronic hypoxic respiratory failure, severe malnutrition, failure to thrive on home hospice care, AS s/p TAVR, hypothyroidism, depression who presented to the ED on 04/09/2023 due to family's inability to care for her. She was afebrile with hypokalemia (K 2.3), hypoalbuminemia, TSH elevation (11.180), and anemia (hgb 8.3). She was given IV fluids and admitted for failure to thrive with plans for placement.   Subjective: Speaking to "Sid" who is not in the room. Calling out periodically. She has no complaints when asked.   Objective: BP 115/86 (BP Location: Right Arm)   Pulse 86   Temp 97.7 F (36.5 C) (Oral)   Resp 19   Ht 5' 2.01" (1.575 m)   Wt 45.3 kg   SpO2 91%   BMI 18.26 kg/m   Gen: No distress, elderly, frail, emaciated Pulm: Clear, nonlabored  CV: Irreg irreg without MRG or edema GI: Soft, NT, ND, +BS  Neuro: Alert and disoriented (only to herself). Moves all extremities. Ext: Warm, dry, decreased muscle bulk   Telemetry: Rate-controlled atrial fibrillation  Assessment & Plan: Failure to thrive: Pt has been on home hospice for FTT, now unable to be cared for adequately in home environment.  - TOC consulted, anticipate SNF disposition. Her prognosis is likely > 2 weeks. Palliative care consulted.   Hypokalemia: Due to poor po intake - Continue supplementation by mouth and add to IVF (continuing these with poor oral intake)  Hypercalcemia: Likely d/t dehydration, appears to be asymptomatic. Albumin and T protein both low. CXR without mass.  - Coming down with IVF which we'll continue.  - PTH, ionized calcium pending. Further work up pending these results and goals of care discussions.    Chronic atrial fibrillation:  - Patient states she no longer takes Eliquis  - Continue  beta-blocker     COPD; chronic hypoxic respiratory failure: Appears to be at baseline.  - Continue supplemental O2, ICS-LAMA-LABA, and as-needed albuterol     Seizure disorder:  - Continue keppra    Hypothyroidism with elevated TSH: Suspect she's not been taking/given her synthroid.  - Restart synthroid at low dose.     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  - Supplement vitamins empirically.    Depression  - Continue SNRI   Tyrone Nine, MD [facility] [email or web address] 04/10/2023, 9:13 AM

## 2023-04-10 NOTE — Progress Notes (Signed)
Initial Nutrition Assessment  DOCUMENTATION CODES:   Underweight  INTERVENTION:   -Ensure Enlive po TID, each supplement provides 350 kcal and 20 grams of protein -MVI with minerals daily -500 mg vitamin C BID -220 mg zinc sulfate daily x 14 days  NUTRITION DIAGNOSIS:   Increased nutrient needs related to wound healing as evidenced by estimated needs.  GOAL:   Patient will meet greater than or equal to 90% of their needs  MONITOR:   PO intake, Supplement acceptance  REASON FOR ASSESSMENT:   Malnutrition Screening Tool    ASSESSMENT:   Pt with medical history significant for hypertension, hypothyroidism, severe AAS status post TAVR, seizures, depression, COPD, chronic hypoxic respiratory failure, and malnutrition who presents for evaluation of buttock pain related to a pressure sore and family's inability to care for her.  Pt admitted with FTT, hypokalemia, and hypercalcemia.  Reviewed I/O's: +1 L x 24 hours  UOP: 550 ml x 24 hours  Pt unavailable at time of visit. Attempted to speak with pt via call to hospital room phone, however, unable to reach. RD unable to obtain further nutrition-related history or complete nutrition-focused physical exam at this time.     Per H&P, pt has been receiving hospice services over the past year for FTT and malnutrition. Pt family is requesting SNF placement due to continued difficulty caring for her.  Pt currently on a regular diet. Noted meal completions 50%.  Reviewed wt hx; pt has experienced a 15.8% wt loss over the past year. While this is not significant for time frame, it is concerning given pt's multiple co-morbidities and underweight status. Highly suspect pt with malnutrition, however, unable to identify at this time. Pt would greatly benefit from addition of oral nutrition supplements.   Medications reviewed and include keppra and 0.9% sodium chloride infusion @ 100 ml/hr.  Labs reviewed: CBGS: 127, K: 2.8.  Diet Order:    Diet Order             Diet regular Room service appropriate? Yes; Fluid consistency: Thin  Diet effective now                   EDUCATION NEEDS:   No education needs have been identified at this time  Skin:  Skin Assessment: Skin Integrity Issues: Skin Integrity Issues:: DTI, Other (Comment) DTI: sacrum Other: skin tear to lt arm  Last BM:  04/09/23 (type 7)  Height:   Ht Readings from Last 1 Encounters:  04/09/23 5' 2.01" (1.575 m)    Weight:   Wt Readings from Last 1 Encounters:  04/10/23 45.3 kg    Ideal Body Weight:  50 kg  BMI:  Body mass index is 18.26 kg/m.  Estimated Nutritional Needs:   Kcal:  1600-1800  Protein:  75-90 grams  Fluid:  > 1.6 L    Levada Schilling, RD, LDN, CDCES Registered Dietitian II Certified Diabetes Care and Education Specialist Please refer to [facility] for RD and/or RD on-call/weekend/after hours pager

## 2023-04-10 NOTE — Plan of Care (Signed)
  Problem: Acute Rehab PT Goals(only PT should resolve) Goal: Pt Will Go Supine/Side To Sit Outcome: Progressing Flowsheets (Taken 04/10/2023 1506) Pt will go Supine/Side to Sit: with moderate assist Goal: Patient Will Transfer Sit To/From Stand Outcome: Progressing Flowsheets (Taken 04/10/2023 1506) Patient will transfer sit to/from stand: with moderate assist Goal: Pt Will Transfer Bed To Chair/Chair To Bed Outcome: Progressing Flowsheets (Taken 04/10/2023 1506) Pt will Transfer Bed to Chair/Chair to Bed: with mod assist Goal: Pt Will Ambulate Outcome: Progressing Flowsheets (Taken 04/10/2023 1506) Pt will Ambulate:  10 feet  with moderate assist  with rolling walker   3:07 PM, 04/10/23 Ocie Bob, MPT Physical Therapist with [facility] [phone] office [phone] mobile phone

## 2023-04-10 NOTE — [place] FL2 (Signed)
[place] MEDICA[place]2 LEVEL OF CARE FORM     IDENTIFICATION  Patient Name: Tammie Martin Birthdate: March 29, 1951 Sex: female Admission Date (Current Location): 04/09/2023  [facility] and [place] Number:  [employer] and Address:  [facility],  [address] [place] [address]      Provider Number: [id number]  Attending Physician Name and Address:  Tyrone Nine, MD  Relative Name and Phone Number:       Current Level of Care: Hospital Recommended Level of Care: Skilled Nursing Facility Prior Approval Number:    Date Approved/Denied:   PASRR Number:    Discharge Plan: SNF    Current Diagnoses: Patient Active Problem List   Diagnosis Date Noted   Failure to thrive in adult 04/09/2023   Normocytic anemia 04/09/2023   Hypercalcemia 04/09/2023   Chronic respiratory failure with hypoxia (HCC) 04/09/2023   History of fracture of left hip 12/06/2022   Bleeding external hemorrhoids 12/06/2022   Closed displaced intertrochanteric fracture of left femur (HCC) 11/03/2022   Hypokalemia 11/03/2022   Seizure disorder (HCC) 11/03/2022   Chronic obstructive pulmonary disease (COPD) (HCC) 11/03/2022   Dyslipidemia 11/03/2022   Anxiety and depression 11/03/2022   Lymph node enlargement 04/15/2022   Acute metabolic encephalopathy 02/07/2022   Acute respiratory failure with hypoxia (HCC) 02/07/2022   Depression 02/07/2022   A-fib (HCC) 02/07/2022   Seizure (HCC) 02/07/2022   Encephalopathy    CVA (cerebral vascular accident) (HCC) 02/04/2022   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   History of cervical cancer 02/01/2022   Severe aortic stenosis 01/17/2022   Tobacco abuse 01/17/2022   Osteopenia 03/11/2020   Olecranon bursitis of left elbow 11/26/2017   Essential hypertension, benign 11/20/2015   Hyperlipidemia LDL goal <130 11/20/2015   Hypothyroidism 11/20/2015   Generalized anxiety disorder 11/20/2015    Orientation  RESPIRATION BLADDER Height & Weight     Self  Normal Incontinent Weight: 99 lb 13.9 oz (45.3 kg) Height:  5' 2.01" (157.5 cm)  BEHAVIORAL SYMPTOMS/MOOD NEUROLOGICAL BOWEL NUTRITION STATUS      Incontinent Diet (see dc summary)  AMBULATORY STATUS COMMUNICATION OF NEEDS Skin   Extensive Assist Verbally Skin abrasions (L arm)                       Personal Care Assistance Level of Assistance  Bathing, Feeding, Dressing Bathing Assistance: Maximum assistance Feeding assistance: Limited assistance Dressing Assistance: Maximum assistance     Functional Limitations Info  Sight, Hearing, Speech Sight Info: Impaired Hearing Info: Adequate Speech Info: Adequate    SPECIAL CARE FACTORS FREQUENCY  PT (By licensed PT), OT (By licensed OT)     PT Frequency: 5x week OT Frequency: 5x week            Contractures Contractures Info: Not present    Additional Factors Info  Code Status, Allergies Code Status Info: DNR Allergies Info: Calcium Channel Blockers, Montelukast, Penicillins, Codeine, Nicoderm (Nicotine), Varenicline           Current Medications (04/10/2023):  This is the current hospital active medication list Current Facility-Administered Medications  Medication Dose Route Frequency Provider Last Rate Last Admin   acetaminophen (TYLENOL) tablet 650 mg  650 mg Oral Q6H PRN Opyd, Lavone Neri, MD       Or   acetaminophen (TYLENOL) suppository 650 mg  650 mg Rectal Q6H PRN Opyd, Lavone Neri, MD       albuterol (PROVENTIL) (2.5 MG/3ML) 0.083%  nebulizer solution 2.5 mg  2.5 mg Nebulization Q6H PRN Opyd, Lavone Neri, MD       ascorbic acid (VITAMIN C) tablet 500 mg  500 mg Oral BID Tyrone Nine, MD       bisoprolol (ZEBETA) tablet 15 mg  15 mg Oral Daily Opyd, Lavone Neri, MD       feeding supplement (ENSURE ENLIVE / ENSURE PLUS) liquid 237 mL  237 mL Oral TID BM Tyrone Nine, MD       fluticasone furoate-vilanterol (BREO ELLIPTA) 100-25 MCG/ACT 1 puff  1 puff Inhalation Daily  Opyd, Lavone Neri, MD   1 puff at 04/10/23 0746   And   umeclidinium bromide (INCRUSE ELLIPTA) 62.5 MCG/ACT 1 puff  1 puff Inhalation Daily Opyd, Lavone Neri, MD   1 puff at 04/10/23 0746   lactated ringers infusion   Intravenous Continuous Tyrone Nine, MD       levETIRAcetam (KEPPRA) tablet 750 mg  750 mg Oral BID Briscoe Deutscher, MD   750 mg at 04/09/23 2259   [START ON 04/11/2023] levothyroxine (SYNTHROID) tablet 25 mcg  25 mcg Oral Q0600 Tyrone Nine, MD       multivitamin with minerals tablet 1 tablet  1 tablet Oral Daily Tyrone Nine, MD       ondansetron (ZOFRAN) tablet 4 mg  4 mg Oral Q6H PRN Opyd, Lavone Neri, MD       Or   ondansetron (ZOFRAN) injection 4 mg  4 mg Intravenous Q6H PRN Opyd, Lavone Neri, MD       oxyCODONE (Oxy IR/ROXICODONE) immediate release tablet 5 mg  5 mg Oral Q4H PRN Opyd, Lavone Neri, MD   5 mg at 04/09/23 2305   potassium chloride (KLOR-CON) packet 60 mEq  60 mEq Oral Once Tyrone Nine, MD       senna-docusate (Senokot-S) tablet 1 tablet  1 tablet Oral QHS PRN Opyd, Lavone Neri, MD       sodium chloride flush (NS) 0.9 % injection 3 mL  3 mL Intravenous Q12H Opyd, Lavone Neri, MD   3 mL at 04/09/23 2200   traZODone (DESYREL) tablet 50 mg  50 mg Oral QHS PRN Opyd, Lavone Neri, MD   50 mg at 04/09/23 2259   venlafaxine XR (EFFEXOR-XR) 24 hr capsule 75 mg  75 mg Oral Q breakfast Opyd, Lavone Neri, MD       zinc sulfate capsule 220 mg  220 mg Oral Daily Tyrone Nine, MD         Discharge Medications: Please see discharge summary for a list of discharge medications.  Relevant Imaging Results:  Relevant Lab Results:   Additional Information SSN: [id number] [place]

## 2023-04-10 NOTE — Evaluation (Signed)
Physical Therapy Evaluation Patient Details Name: Tammie Martin MRN: [id number] DOB: 03-07-1951 Today's Date: 04/10/2023  History of Present Illness  Tammie Martin is a 72 y.o. female with medical history significant for hypertension, hypothyroidism, severe AAS status post TAVR, seizures, depression, COPD, chronic hypoxic respiratory failure, and malnutrition who presents for evaluation of buttock pain related to a pressure sore and family's inability to care for her.     Hospice nurse reports that the patient was enrolled in hospice roughly a year ago due to severe protein calorie malnutrition/failure to thrive.  Patient's daughter has been reportedly calling the hospice agency multiple times daily for the past week asking them to come out and provide basic care for the patient.  Daughter reportedly called today stating that she wants her mother sent to the emergency department and placed in a nursing facility.     Patient reports that she is hungry, thirsty, and experiencing buttock pain at the site of a pressure sore.  She denies chest pain, abdominal pain, nausea, or vomiting.     When EMS arrived at the patient's residence, there was a 74 year old relative of the patient there, but no other adult.   Clinical Impression  Patient demonstrates slow labored movement for sitting up at bedside with c/o severe pain BLE with movement/pressure, once seated able to maintain sitting balance, but unable to fully stand or transfer to chair due to BLE weakness and fatigue.  Patient put back to bed with Max assist to reposition.  Patient will benefit from continued skilled physical therapy in hospital and recommended venue below to increase strength, balance, endurance for safe ADLs and gait.          If plan is discharge home, recommend the following: A lot of help with bathing/dressing/bathroom;A lot of help with walking and/or transfers;Help with stairs or ramp for entrance;Assistance with  cooking/housework   Can travel by private vehicle   No    Equipment Recommendations None recommended by PT  Recommendations for Other Services       Functional Status Assessment Patient has had a recent decline in their functional status and demonstrates the ability to make significant improvements in function in a reasonable and predictable amount of time.     Precautions / Restrictions Precautions Precautions: Fall Restrictions Weight Bearing Restrictions: No      Mobility  Bed Mobility Overal bed mobility: Needs Assistance Bed Mobility: Supine to Sit, Sit to Supine     Supine to sit: Max assist Sit to supine: Mod assist, Max assist   General bed mobility comments: slow labored movement with c/o increased pain BLE with movement/pressure    Transfers Overall transfer level: Needs assistance Equipment used: Rolling walker (2 wheels) Transfers: Sit to/from Stand Sit to Stand: Mod assist           General transfer comment: very unsteady on feet with buckling of knees due to weakness    Ambulation/Gait                  Stairs            Wheelchair Mobility     Tilt Bed    Modified Rankin (Stroke Patients Only)       Balance Overall balance assessment: Needs assistance Sitting-balance support: Feet supported, No upper extremity supported Sitting balance-Leahy Scale: Poor Sitting balance - Comments: fair/poor seated at EOB   Standing balance support: Reliant on assistive device for balance, During functional activity, Bilateral upper extremity supported Standing  balance-Leahy Scale: Poor Standing balance comment: using RW                             Pertinent Vitals/Pain Pain Assessment Pain Assessment: Faces Faces Pain Scale: Hurts even more Pain Location: movement, pressure to BLE Pain Descriptors / Indicators: Sore, Grimacing, Guarding Pain Intervention(s): Limited activity within patient's tolerance, Monitored during  session, Repositioned    Home Living Family/patient expects to be discharged to:: Private residence Living Arrangements: Children Available Help at Discharge: Family;Available 24 hours/day Type of Home: Apartment Home Access: Level entry;Elevator       Home Layout: One level Home Equipment: Educational psychologist (4 wheels);Grab bars - tub/shower      Prior Function Prior Level of Function : Needs assist       Physical Assist : Mobility (physical);ADLs (physical) Mobility (physical): Bed mobility;Transfers;Gait;Stairs   Mobility Comments: short distanced household ambulator using Rollator ADLs Comments: assisted by family     Extremity/Trunk Assessment   Upper Extremity Assessment Upper Extremity Assessment: Generalized weakness    Lower Extremity Assessment Lower Extremity Assessment: Generalized weakness    Cervical / Trunk Assessment Cervical / Trunk Assessment: Kyphotic  Communication   Communication Communication: No apparent difficulties  Cognition Arousal: Alert Behavior During Therapy: Anxious Overall Cognitive Status: No family/caregiver present to determine baseline cognitive functioning                                          General Comments      Exercises     Assessment/Plan    PT Assessment Patient needs continued PT services  PT Problem List Decreased strength;Decreased activity tolerance;Decreased balance;Decreased mobility       PT Treatment Interventions DME instruction;Gait training;Stair training;Functional mobility training;Therapeutic activities;Therapeutic exercise;Balance training;Patient/family education    PT Goals (Current goals can be found in the Care Plan section)  Acute Rehab PT Goals Patient Stated Goal: return home with family to assist PT Goal Formulation: With patient Time For Goal Achievement: 04/24/23 Potential to Achieve Goals: Fair    Frequency Min 3X/week     Co-evaluation                AM-PAC PT "6 Clicks" Mobility  Outcome Measure Help needed turning from your back to your side while in a flat bed without using bedrails?: A Lot Help needed moving from lying on your back to sitting on the side of a flat bed without using bedrails?: A Lot Help needed moving to and from a bed to a chair (including a wheelchair)?: A Lot Help needed standing up from a chair using your arms (e.g., wheelchair or bedside chair)?: A Lot Help needed to walk in hospital room?: Total Help needed climbing 3-5 steps with a railing? : Total 6 Click Score: 10    End of Session   Activity Tolerance: Patient tolerated treatment well;Patient limited by fatigue Patient left: in bed;with call bell/phone within reach Nurse Communication: Mobility status PT Visit Diagnosis: Unsteadiness on feet (R26.81);Other abnormalities of gait and mobility (R26.89);Muscle weakness (generalized) (M62.81)    Time: 4098-1191 PT Time Calculation (min) (ACUTE ONLY): 27 min   Charges:   PT Evaluation $PT Eval Moderate Complexity: 1 Mod PT Treatments $Therapeutic Activity: 23-37 mins PT General Charges $$ ACUTE PT VISIT: 1 Visit         3:04 PM, 04/10/23  Ocie Bob, MPT Physical Therapist with [facility] [phone] office [phone] mobile phone

## 2023-04-10 NOTE — TOC Initial Note (Signed)
Transition of Care [facility]) - Initial/Assessment Note    Patient Details  Name: Tammie Martin MRN: [id number] Date of Birth: 02/01/1951  [facility] CM/SW Contact:    Elliot Gault, LCSW Phone Number: 04/10/2023, 10:06 AM  Clinical Narrative:                  Pt admitted from home. Received TOC consult for SNF placement. Reviewed pt's record and discussed with pt's granddaughter.  Gdtr states that pt has been on hospice with [facility] at home but that they have requested to revoke hospice care and they would like SNF rehab for pt.  Explained SNF rehab referral and insurance auth process. CMS provider options reviewed. Will refer as requested and start insurance auth request once PT eval complete.   Updated MD and requested PT eval. Will follow.   Expected Discharge Plan: Skilled Nursing Facility Barriers to Discharge: Continued Medical Work up, SNF Pending bed offer, Insurance Authorization   Patient Goals and CMS Choice Patient states their goals for this hospitalization and ongoing recovery are:: SNF placement CMS Medicare.gov Compare Post Acute Care list provided to:: Patient Represenative (must comment) Choice offered to / list presented to : Adult Children [place] ownership interest in [facility].provided to:: Adult Children    Expected Discharge Plan and Services In-house Referral: Clinical Social Work   Post Acute Care Choice: Skilled Nursing Facility Living arrangements for the past 2 months: Single Family Home                                      Prior Living Arrangements/Services Living arrangements for the past 2 months: Single Family Home Lives with:: Adult Children Patient language and need for interpreter reviewed:: Yes Do you feel safe going back to the place where you live?: Yes      Need for Family Participation in Patient Care: Yes (Comment) Care giver support system in place?: Yes (comment) Current home services:  DME, Hospice Criminal Activity/Legal Involvement Pertinent to Current Situation/Hospitalization: No - Comment as needed  Activities of Daily Living Home Assistive Devices/Equipment: Wheelchair ADL Screening (condition at time of admission) Patient's cognitive ability adequate to safely complete daily activities?: No Is the patient deaf or have difficulty hearing?: No Does the patient have difficulty seeing, even when wearing glasses/contacts?: No Does the patient have difficulty concentrating, remembering, or making decisions?: Yes Patient able to express need for assistance with ADLs?: Yes Does the patient have difficulty dressing or bathing?: Yes Independently performs ADLs?: No Does the patient have difficulty walking or climbing stairs?: Yes Weakness of Legs: Both Weakness of Arms/Hands: Both  Permission Sought/Granted Permission sought to share information with : Facility Industrial/product designer granted to share information with : Yes, Verbal Permission Granted     Permission granted to share info w AGENCY: snfs        Emotional Assessment       Orientation: : Oriented to Self Alcohol / Substance Use: Not Applicable Psych Involvement: No (comment)  Admission diagnosis:  Dehydration [E86.0] Hypokalemia [E87.6] Severe protein-calorie malnutrition (HCC) [E43] Failure to thrive in adult [R62.7] Patient Active Problem List   Diagnosis Date Noted   Failure to thrive in adult 04/09/2023   Normocytic anemia 04/09/2023   Hypercalcemia 04/09/2023   Chronic respiratory failure with hypoxia (HCC) 04/09/2023   History of fracture of left hip 12/06/2022   Bleeding external hemorrhoids 12/06/2022  Closed displaced intertrochanteric fracture of left femur (HCC) 11/03/2022   Hypokalemia 11/03/2022   Seizure disorder (HCC) 11/03/2022   Chronic obstructive pulmonary disease (COPD) (HCC) 11/03/2022   Dyslipidemia 11/03/2022   Anxiety and depression 11/03/2022   Lymph  node enlargement 04/15/2022   Acute metabolic encephalopathy 02/07/2022   Acute respiratory failure with hypoxia (HCC) 02/07/2022   Depression 02/07/2022   A-fib (HCC) 02/07/2022   Seizure (HCC) 02/07/2022   Encephalopathy    CVA (cerebral vascular accident) (HCC) 02/04/2022   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   History of cervical cancer 02/01/2022   Severe aortic stenosis 01/17/2022   Tobacco abuse 01/17/2022   Osteopenia 03/11/2020   Olecranon bursitis of left elbow 11/26/2017   Essential hypertension, benign 11/20/2015   Hyperlipidemia LDL goal <130 11/20/2015   Hypothyroidism 11/20/2015   Generalized anxiety disorder 11/20/2015   PCP:  Raliegh Ip, DO Pharmacy:   [facility] [place], [place] - [address] [place] [address] Phone: [phone] Fax: [phone]  [id number] - [place], [place] - [address] [place] [address] Phone: [phone] Fax: [phone]     Social Determinants of Health (SDOH) Social History: SDOH Screenings   Food Insecurity: Patient Unable To Answer (04/09/2023)  Housing: Patient Unable To Answer (04/09/2023)  Transportation Needs: Patient Unable To Answer (04/09/2023)  Utilities: Patient Unable To Answer (04/09/2023)  Alcohol Screen: Low Risk  (12/19/2022)  Depression (PHQ2-9): High Risk (12/19/2022)  Financial Resource Strain: Low Risk  (12/19/2022)  Physical Activity: Inactive (12/19/2022)  Social Connections: Moderately Integrated (12/19/2022)  Stress: Stress Concern Present (12/19/2022)  Tobacco Use: High Risk (04/09/2023)   SDOH Interventions:     Readmission Risk Interventions    02/02/2022    1:58 PM  Readmission Risk Prevention Plan  Post Dischage Appt Complete  Medication Screening Complete  Transportation Screening Complete

## 2023-04-10 NOTE — Progress Notes (Signed)
Patient unable to feed herself and incontinent of both bowel and bladder. Turned q2h with complaint from patient. Medications were crushed in placed in applesauce and she did take majority of medicine but the last bit she spit back out. Patient will yell out and curse at staff when attempting to clean her up from bowel movement and will swing her arms at staff.

## 2023-04-11 ENCOUNTER — Telehealth: Payer: Self-pay | Admitting: Family Medicine

## 2023-04-11 DIAGNOSIS — R338 Other retention of urine: Secondary | ICD-10-CM | POA: Diagnosis not present

## 2023-04-11 DIAGNOSIS — Z8249 Family history of ischemic heart disease and other diseases of the circulatory system: Secondary | ICD-10-CM | POA: Diagnosis not present

## 2023-04-11 DIAGNOSIS — E785 Hyperlipidemia, unspecified: Secondary | ICD-10-CM | POA: Diagnosis present

## 2023-04-11 DIAGNOSIS — F32A Depression, unspecified: Secondary | ICD-10-CM | POA: Diagnosis present

## 2023-04-11 DIAGNOSIS — J439 Emphysema, unspecified: Secondary | ICD-10-CM | POA: Diagnosis present

## 2023-04-11 DIAGNOSIS — J9611 Chronic respiratory failure with hypoxia: Secondary | ICD-10-CM | POA: Diagnosis present

## 2023-04-11 DIAGNOSIS — Z7189 Other specified counseling: Secondary | ICD-10-CM | POA: Diagnosis not present

## 2023-04-11 DIAGNOSIS — E876 Hypokalemia: Secondary | ICD-10-CM | POA: Diagnosis present

## 2023-04-11 DIAGNOSIS — I480 Paroxysmal atrial fibrillation: Secondary | ICD-10-CM | POA: Diagnosis present

## 2023-04-11 DIAGNOSIS — E8809 Other disorders of plasma-protein metabolism, not elsewhere classified: Secondary | ICD-10-CM | POA: Diagnosis present

## 2023-04-11 DIAGNOSIS — E860 Dehydration: Secondary | ICD-10-CM | POA: Diagnosis present

## 2023-04-11 DIAGNOSIS — E43 Unspecified severe protein-calorie malnutrition: Secondary | ICD-10-CM | POA: Diagnosis present

## 2023-04-11 DIAGNOSIS — I350 Nonrheumatic aortic (valve) stenosis: Secondary | ICD-10-CM | POA: Diagnosis present

## 2023-04-11 DIAGNOSIS — G40909 Epilepsy, unspecified, not intractable, without status epilepticus: Secondary | ICD-10-CM | POA: Diagnosis present

## 2023-04-11 DIAGNOSIS — Z833 Family history of diabetes mellitus: Secondary | ICD-10-CM | POA: Diagnosis not present

## 2023-04-11 DIAGNOSIS — E039 Hypothyroidism, unspecified: Secondary | ICD-10-CM | POA: Diagnosis present

## 2023-04-11 DIAGNOSIS — F17210 Nicotine dependence, cigarettes, uncomplicated: Secondary | ICD-10-CM | POA: Diagnosis present

## 2023-04-11 DIAGNOSIS — Z66 Do not resuscitate: Secondary | ICD-10-CM | POA: Diagnosis present

## 2023-04-11 DIAGNOSIS — I10 Essential (primary) hypertension: Secondary | ICD-10-CM | POA: Diagnosis present

## 2023-04-11 DIAGNOSIS — F0394 Unspecified dementia, unspecified severity, with anxiety: Secondary | ICD-10-CM | POA: Diagnosis present

## 2023-04-11 DIAGNOSIS — R627 Adult failure to thrive: Secondary | ICD-10-CM | POA: Diagnosis present

## 2023-04-11 DIAGNOSIS — L89309 Pressure ulcer of unspecified buttock, unspecified stage: Secondary | ICD-10-CM | POA: Diagnosis present

## 2023-04-11 DIAGNOSIS — Z515 Encounter for palliative care: Secondary | ICD-10-CM | POA: Diagnosis not present

## 2023-04-11 DIAGNOSIS — F0393 Unspecified dementia, unspecified severity, with mood disturbance: Secondary | ICD-10-CM | POA: Diagnosis present

## 2023-04-11 DIAGNOSIS — Z681 Body mass index (BMI) 19 or less, adult: Secondary | ICD-10-CM | POA: Diagnosis not present

## 2023-04-11 LAB — BASIC METABOLIC PANEL
Anion gap: 8 (ref 5–15)
BUN: 10 mg/dL (ref 8–23)
CO2: 29 mmol/L (ref 22–32)
Calcium: 11.7 mg/dL — ABNORMAL HIGH (ref 8.9–10.3)
Chloride: 99 mmol/L (ref 98–111)
Creatinine, Ser: 0.54 mg/dL (ref 0.44–1.00)
GFR, Estimated: >60 mL/min (ref >60)
Glucose, Bld: 93 mg/dL (ref 70–99)
Potassium: 3.3 mmol/L — ABNORMAL LOW (ref 3.5–5.1)
Sodium: 136 mmol/L (ref 135–145)

## 2023-04-11 LAB — VITAMIN D 25 HYDROXY (VIT D DEFICIENCY, FRACTURES): Vit D, 25-Hydroxy: 45.09 ng/mL (ref 30–100)

## 2023-04-11 MED ORDER — SODIUM CHLORIDE 0.9 % IV SOLN: INTRAVENOUS | Status: DC

## 2023-04-11 MED ORDER — POTASSIUM CHLORIDE 20 MEQ PO PACK
60.0000 meq | PACK | Freq: Once | ORAL | Status: AC
  Administered 2023-04-11: 60 meq via ORAL
  Filled 2023-04-11: qty 3

## 2023-04-11 MED ORDER — POTASSIUM CHLORIDE 10 MEQ/100ML IV SOLN
10.0000 meq | INTRAVENOUS | Status: AC
  Administered 2023-04-11 (×2): 10 meq via INTRAVENOUS
  Filled 2023-04-11: qty 100

## 2023-04-11 MED ORDER — POTASSIUM CHLORIDE 20 MEQ PO PACK: 60.0000 meq | PACK | Freq: Once | ORAL | Status: DC

## 2023-04-11 MED ORDER — CALCITONIN (SALMON) 200 UNIT/ML IJ SOLN
4.0000 [IU]/kg | Freq: Two times a day (BID) | INTRAMUSCULAR | Status: AC
  Administered 2023-04-11 (×2): 182 [IU] via SUBCUTANEOUS
  Filled 2023-04-11 (×2): qty 0.91

## 2023-04-11 MED ORDER — POTASSIUM CHLORIDE 20 MEQ PO PACK
60.0000 meq | PACK | Freq: Once | ORAL | Status: AC
  Administered 2023-04-11: 60 meq via ORAL

## 2023-04-11 MED ORDER — ZOLEDRONIC ACID 4 MG/100ML IV SOLN
4.0000 mg | Freq: Once | INTRAVENOUS | Status: AC
  Administered 2023-04-11: 4 mg via INTRAVENOUS
  Filled 2023-04-11: qty 100

## 2023-04-11 NOTE — Progress Notes (Addendum)
[facility] PROGRESS NOTE  Tammie Martin (DOB: 06-05-1951) AOZ:[id number] PCP: Raliegh Ip, DO  Brief Narrative: Tammie Martin is a 72 y.o. female with a history of COPD with chronic hypoxic respiratory failure, severe malnutrition, failure to thrive on home hospice care, AS s/p TAVR, hypothyroidism, depression who presented to the ED on 04/09/2023 due to family's inability to care for her. She was afebrile with hypercalcemia, hypokalemia (K 2.3), hypoalbuminemia, TSH elevation (11.180), and anemia (hgb 8.3). She was given IV fluids and admitted for failure to thrive with plans for placement. Bisphosphonate and calcitonin added 8/13.  Patient is clearly unable to manage her own affairs or to make her own medical decisions. She requires a guardian for this reason.   Subjective: Continues to be poorly redirectable, delirious. Has no complaints this morning.   Objective: BP 122/75 (BP Location: Left Arm)   Pulse 81   Temp 97.9 F (36.6 C) (Oral)   Resp 16   Ht 5' 2.01" (1.575 m)   Wt 45.6 kg   SpO2 98%   BMI 18.38 kg/m   Gen: Elderly frail female in no distress Pulm: Clear, nonlabored  CV: Irreg irreg, no MRG or edema GI: Soft, NT, ND, +BS Neuro: Alert and disoriented. Ext: Warm, no deformities, decreased muscle bulk  Telemetry: Rate-controlled atrial fibrillation  Assessment & Plan: Failure to thrive: Pt has been on home hospice for FTT, now unable to be cared for adequately in home environment.  - TOC consulted, anticipate SNF disposition. Her prognosis is likely > 2 weeks. Palliative care also consulted.   Hypokalemia: Due to poor po intake - Continue supplementation by mouth, recheck this PM to guide ongoing supplementation.  Hypercalcemia: Albumin and T protein both low. CXR without mass/bone lesions.  - Initially suspected to be asymptomatic and due purely to dehydration though her mentation remains grossly abnormal (came in because her care needs  became more intensive) and Ca remains elevated (corrected for hypoalbuminemia, comes to 13.1g/dl today). PTH and ionized calcium still pending. Will add PTHrP, vitamin D, give zoledronic acid and calcitonin, continue IVF w/NS, monitor levels closely.    Chronic atrial fibrillation:  - Patient states she no longer takes eliquis  - Continue beta-blocker     COPD; chronic hypoxic respiratory failure: Appears to be at baseline.  - Continue supplemental O2, ICS-LAMA-LABA, and as-needed albuterol     Seizure disorder:  - Continue keppra    Hypothyroidism with elevated TSH: Suspect she's not been taking/given her synthroid.  - Restarted synthroid at low dose, .     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  - Supplement vitamins empirically.    Depression  - Continue SNRI   Tyrone Nine, MD [facility] [email or web address] 04/11/2023, 8:41 AM

## 2023-04-11 NOTE — Telephone Encounter (Signed)
FYI

## 2023-04-11 NOTE — Progress Notes (Deleted)
Date and time results received: 04/11/23 0632  Reported by;Sonya, LAB Reported to: Pamelia Hoit, RN   Test: hemoglobin  Critical Value: 6.6  Name of Provider Notified: Opyd  Orders Received? Or Actions Taken?: MD notified, awaiting orders

## 2023-04-11 NOTE — TOC Progression Note (Signed)
Transition of Care [facility]) - Progression Note    Patient Details  Name: Tammie Martin MRN: [id number] Date of Birth: [birth year]  [facility]) CM/SW Contact  Elliot Gault, LCSW Phone Number: 04/11/2023, 2:00 PM  Clinical Narrative:     TOC following. Spoke with admissions at [facility] and they are continuing to work on possible admission with [facility] pending.  TOC spoke with pt's granddaughter to update that she needs to go to [facility] to start the medicaid application as apparently the application process she started previously was online with the state of [place] and this is not the necessary application.  Gdtr stated that she would take care of this. She is also agreeable to [facility] sending out the SNF LTC referral to other local SNF options.  TOC will follow.  Expected Discharge Plan: Skilled Nursing Facility Barriers to Discharge: Continued Medical Work up, SNF Pending bed offer, English as a second language teacher  Expected Discharge Plan and Services In-house Referral: Clinical Social Work   Post Acute Care Choice: Skilled Nursing Facility Living arrangements for the past 2 months: Single Family Home                                       Social Determinants of Health (SDOH) Interventions SDOH Screenings   Food Insecurity: Patient Unable To Answer (04/09/2023)  Housing: Patient Unable To Answer (04/09/2023)  Transportation Needs: Patient Unable To Answer (04/09/2023)  Utilities: Patient Unable To Answer (04/09/2023)  Alcohol Screen: Low Risk  (12/19/2022)  Depression (PHQ2-9): High Risk (12/19/2022)  Financial Resource Strain: Low Risk  (12/19/2022)  Physical Activity: Inactive (12/19/2022)  Social Connections: Moderately Integrated (12/19/2022)  Stress: Stress Concern Present (12/19/2022)  Tobacco Use: High Risk (04/09/2023)    Readmission Risk Interventions    02/02/2022    1:58 PM  Readmission Risk Prevention Plan  Post Dischage Appt Complete   Medication Screening Complete  Transportation Screening Complete

## 2023-04-11 NOTE — Plan of Care (Signed)
  Problem: Education: Goal: Knowledge of General Education information will improve Description: Including pain rating scale, medication(s)/side effects and non-pharmacologic comfort measures Outcome: Progressing   Problem: Education: Goal: Knowledge of General Education information will improve Description: Including pain rating scale, medication(s)/side effects and non-pharmacologic comfort measures Outcome: Progressing   Problem: Clinical Measurements: Goal: Will remain free from infection Outcome: Progressing   Problem: Clinical Measurements: Goal: Cardiovascular complication will be avoided Outcome: Progressing   Problem: Activity: Goal: Risk for activity intolerance will decrease Outcome: Progressing   Problem: Nutrition: Goal: Adequate nutrition will be maintained Outcome: Progressing   Problem: Elimination: Goal: Will not experience complications related to bowel motility Outcome: Progressing Goal: Will not experience complications related to urinary retention Outcome: Progressing   Problem: Pain Managment: Goal: General experience of comfort will improve Outcome: Progressing

## 2023-04-11 NOTE — Progress Notes (Signed)
Date and time results received: 04/11/23 0635  Test: Potassium Critical Value: 2.7  Name of Provider Notified: Opyd  Orders Received? Or Actions Taken?: MD notified, oral and IV potassium ordered, will monitor

## 2023-04-12 ENCOUNTER — Encounter (HOSPITAL_COMMUNITY): Payer: Self-pay | Admitting: Family Medicine

## 2023-04-12 DIAGNOSIS — Z515 Encounter for palliative care: Secondary | ICD-10-CM

## 2023-04-12 DIAGNOSIS — E43 Unspecified severe protein-calorie malnutrition: Secondary | ICD-10-CM | POA: Insufficient documentation

## 2023-04-12 DIAGNOSIS — Z7189 Other specified counseling: Secondary | ICD-10-CM

## 2023-04-12 DIAGNOSIS — R627 Adult failure to thrive: Secondary | ICD-10-CM | POA: Diagnosis not present

## 2023-04-12 LAB — MRSA NEXT GEN BY PCR, NASAL: MRSA by PCR Next Gen: DETECTED — AB

## 2023-04-12 MED ORDER — FUROSEMIDE 10 MG/ML IJ SOLN
40.0000 mg | Freq: Once | INTRAMUSCULAR | Status: AC
  Administered 2023-04-12: 40 mg via INTRAVENOUS
  Filled 2023-04-12: qty 4

## 2023-04-12 MED ORDER — CALCITONIN (SALMON) 200 UNIT/ML IJ SOLN
8.0000 [IU]/kg | Freq: Two times a day (BID) | INTRAMUSCULAR | Status: AC
  Administered 2023-04-12 (×2): 366 [IU] via SUBCUTANEOUS
  Filled 2023-04-12 (×2): qty 1.83

## 2023-04-12 NOTE — TOC Progression Note (Signed)
Transition of Care [facility]) - Progression Note    Patient Details  Name: Tammie Martin MRN: [id number] Date of Birth: 1951/01/21  Transition of Care [facility]) CM/SW Contact  Villa Herb, [place] Phone Number: 04/12/2023, 1:05 PM  Clinical Narrative:    CSW spoke to admissions at [facility], they are awaiting a call from pts Medicaid caseworker at this time, she will provide update shortly. TOC to follow.   Expected Discharge Plan: Skilled Nursing Facility Barriers to Discharge: Continued Medical Work up, SNF Pending bed offer, English as a second language teacher  Expected Discharge Plan and Services In-house Referral: Clinical Social Work   Post Acute Care Choice: Skilled Nursing Facility Living arrangements for the past 2 months: Single Family Home                                       Social Determinants of Health (SDOH) Interventions SDOH Screenings   Food Insecurity: Patient Unable To Answer (04/09/2023)  Housing: Patient Unable To Answer (04/09/2023)  Transportation Needs: Patient Unable To Answer (04/09/2023)  Utilities: Patient Unable To Answer (04/09/2023)  Alcohol Screen: Low Risk  (12/19/2022)  Depression (PHQ2-9): High Risk (12/19/2022)  Financial Resource Strain: Low Risk  (12/19/2022)  Physical Activity: Inactive (12/19/2022)  Social Connections: Moderately Integrated (12/19/2022)  Stress: Stress Concern Present (12/19/2022)  Tobacco Use: High Risk (04/09/2023)    Readmission Risk Interventions    02/02/2022    1:58 PM  Readmission Risk Prevention Plan  Post Dischage Appt Complete  Medication Screening Complete  Transportation Screening Complete

## 2023-04-12 NOTE — Hospital Course (Signed)
Ms. Dering is a 72 yo female with PMH  COPD with chronic hypoxic respiratory failure, severe malnutrition, failure to thrive on home hospice care, AS s/p TAVR, hypothyroidism, depression who presented to the ED on 04/09/2023 due to family's inability to care for her. She was afebrile with hypercalcemia, hypokalemia (K 2.3), hypoalbuminemia, TSH elevation (11.180), and anemia (hgb 8.3). She was given IV fluids and admitted for failure to thrive with plans for placement. Bisphosphonate and calcitonin added 8/13.   Patient is clearly unable to manage her own affairs or to make her own medical decisions. She requires a guardian for this reason.

## 2023-04-12 NOTE — Consult Note (Signed)
Consultation Note Date: 04/12/2023   Patient Name: Tammie Martin  DOB: December 15, 1950  MRN: [id number]  Age / Sex: 72 y.o., female  PCP: Raliegh Ip, DO Referring Physician: Lewie Chamber, MD  Reason for Consultation: Establishing goals of care  HPI/Patient Profile: 72 y.o. female  with past medical history of COPD with chronic hypoxic respiratory failure, severe malnutrition, failure to thrive on home hospice care with Tammie Martin (rescinded), AS s/p TAVR, hypothyroid, depression, admitted on 04/09/2023 with failure to thrive with low albumin and hypercalcemia.   Clinical Assessment and Goals of Care: I have reviewed medical records including EPIC notes, labs and imaging, received report from RN, assessed the patient.  Tammie Martin is sitting up quietly in bed.  She appears acutely/chronically ill and quite frail.  She greets me, making and somewhat keeping eye contact.  I offer her something to drink.  She is able to take sips of water without overt signs and symptoms of aspiration.  I ask what she thought of her water and she stated, "it sucked".  She states that she does not care for how the water tastes here.  I believe that she can make her basic needs known.  There is no family at bedside at this time.  We talk about her acute health concerns.  When I ask what this place is that we are she does not answer.  She does state that she "came here to die".  I asked her how she feels about this.  She tells me that she was "ready until I came here".  I share that we have lifted her up with IV fluids and treatments.  We talk about disposition to short-term rehab.  Ask her how she feels about this.  She tells me that she is unsure.  She is agreeable for me to call her granddaughter, Tammie Martin.  Call to grand daughter, Tammie Martin, to discuss diagnosis prognosis, GOC, EOL wishes, disposition and options.  I  introduced Palliative Medicine as specialized medical care for people living with serious illness. It focuses on providing relief from the symptoms and stress of a serious illness. The goal is to improve quality of life for both the patient and the family.  We focused on their current illness.  I share my discussion with Tammie Martin this morning.  Tammie Martin shares her inability to continue to care for Tammie Martin at home.  She states that they started Medicaid application in June and are waiting for placement assistance.  I ask if she would be interested in returning to hospice care at some point in the future.  The natural disease trajectory and expectations at EOL were discussed.  Advanced directives, concepts specific to code status, artifical feeding and hydration, and rehospitalization were considered and discussed.  DNR noted.  Hospice and Palliative Care services outpatient were explained and offered.  Mrs. Combes had been active with [facility].  She has rescinded hospice in order to seek rehab and quite possibly long-term care placement.  We  discussed returning to hospice care once and under long-term care.  When asked if they would accept hospice again in the future, Tammie Martin states "not with [facility]".  Discussed the importance of continued conversation with family and the medical providers regarding overall plan of care and treatment options, ensuring decisions are within the context of the patient's values and GOCs.  Questions and concerns were addressed.  The family was encouraged to call with questions or concerns.  PMT will continue to support holistically.  Conference with attending, bedside nursing staff, transition of care team related to patient condition, needs, goals of care, disposition.   HCPOA NEXT OF KIN    SUMMARY OF RECOMMENDATIONS   At this point continue to treat the treatable but no CPR or intubation. Seeking short-term rehab and  anticipated long-term care Unsure if they will continue to seek hospice care   Code Status/Advance Care Planning: DNR  Symptom Management:  Per hospitalist, no additional needs at this time.  Palliative Prophylaxis:  Frequent Pain Assessment and Oral Care  Additional Recommendations (Limitations, Scope, Preferences): Continue to treat but no CPR or intubation  Psycho-social/Spiritual:  Desire for further Chaplaincy support:no Additional Recommendations: Caregiving  Support/Resources and Education on Hospice  Prognosis:  Unable to determine, based on outcomes.  Less than 3 months would be anticipated based on chronic illness burden, decreasing functional status, failure to thrive, albumin of 1.9.  Discharge Planning: At this point family has revoked hospice and is seeking short-term rehab.       Primary Diagnoses: Present on Admission:  Failure to thrive in adult  Hypokalemia  Chronic obstructive pulmonary disease (COPD) (HCC)  Anxiety and depression  A-fib (HCC)  Normocytic anemia  Hypercalcemia  Chronic respiratory failure with hypoxia (HCC)   I have reviewed the medical record, interviewed the patient and family, and examined the patient. The following aspects are pertinent.  Past Medical History:  Diagnosis Date   Allergy    Anxiety    Atrial fibrillation (HCC)    Cancer (HCC) 1979   cervical cancer   Cataract    COPD (chronic obstructive pulmonary disease) (HCC)    Depression    DJD (degenerative joint disease)    Emphysema of lung (HCC)    Heart murmur    History of cervical cancer    Hyperlipidemia    Hypertension    Hypothyroidism    Normocytic anemia 04/09/2023   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 Excell Seltzer and Dr. Laneta Simmers   Seizures [facility])    Severe aortic stenosis    Stroke [facility])    Substance abuse (HCC)    Thyroid disease    Tobacco abuse     Social History   Socioeconomic History   Marital status: Widowed    Spouse name: Not on file   Number of children: 2   Years of education: 14   Highest education level: Associate degree: academic program  Occupational History   Occupation: Retired    Comment: Designer, jewellery  Tobacco Use   Smoking status: Every Day    Current packs/day: 1.00    Average packs/day: 1 pack/day for 40.0 years (40.0 ttl pk-yrs)    Types: Cigarettes    Passive exposure: Current   Smokeless tobacco: Never   Tobacco comments:    Smoking Cessation Classes, Services, Agencies & Resources Offered.  Vaping Use   Vaping status: Never Used  Substance and Sexual  Activity   Alcohol use: Not Currently   Drug use: No   Sexual activity: Not Currently  Other Topics Concern   Not on file  Social History Narrative   Daughter currently living with her  one cat, one dog   Was a nurse for 30 years    Social Determinants of Health   Financial Resource Strain: Low Risk  (12/19/2022)   Overall Financial Resource Strain (CARDIA)    Difficulty of Paying Living Expenses: Not hard at all  Food Insecurity: Patient Unable To Answer (04/09/2023)   Hunger Vital Sign    Worried About Running Out of Food in the Last Year: Patient unable to answer    Ran Out of Food in the Last Year: Patient unable to answer  Transportation Needs: Patient Unable To Answer (04/09/2023)   PRAPARE - Transportation    Lack of Transportation (Medical): Patient unable to answer    Lack of Transportation (Non-Medical): Patient unable to answer  Physical Activity: Inactive (12/19/2022)   Exercise Vital Sign    Days of Exercise per Week: 0 days    Minutes of Exercise per Session: 0 min  Stress: Stress Concern Present (12/19/2022)   [employer] of Occupational Health - Occupational Stress Questionnaire    Feeling of Stress : To some extent  Social Connections: Moderately Integrated (12/19/2022)   Social Connection and Isolation Panel  [NHANES]    Frequency of Communication with Friends and Family: More than three times a week    Frequency of Social Gatherings with Friends and Family: More than three times a week    Attends Religious Services: 1 to 4 times per year    Active Member of [employer] or Organizations: Yes    Attends Banker Meetings: More than 4 times per year    Marital Status: Widowed   Family History  Problem Relation Age of Onset   Cancer Mother        breast cancer at 73    Kidney disease Mother    Early death Mother    Cancer Maternal Aunt        colon cancer   Heart attack Father    Early death Father    Heart disease Father    Diabetes Sister    Obesity Sister    Depression Sister    Arthritis Sister    Alcohol abuse Brother    Cirrhosis Brother    Depression Brother    Liver disease Brother    Drug abuse Brother    Early death Brother    Diabetes Daughter    Fibromyalgia Daughter    Depression Daughter    Anxiety disorder Daughter    Arthritis Daughter    Depression Sister    Obesity Sister    Diabetes Sister    Early death Sister    Scheduled Meds:  ascorbic acid  500 mg Oral BID   bisoprolol  15 mg Oral Daily   calcitonin  8 Units/kg Subcutaneous BID   feeding supplement  237 mL Oral TID BM   fluticasone furoate-vilanterol  1 puff Inhalation Daily   And   umeclidinium bromide  1 puff Inhalation Daily   levETIRAcetam  750 mg Oral BID   levothyroxine  25 mcg Oral Q0600   multivitamin with minerals  1 tablet Oral Daily   sodium chloride flush  3 mL Intravenous Q12H   venlafaxine XR  75 mg Oral Q breakfast   zinc sulfate  220 mg Oral Daily  Continuous Infusions:  sodium chloride 75 mL/hr at 04/12/23 0927   PRN Meds:.acetaminophen **OR** acetaminophen, albuterol, ondansetron **OR** ondansetron (ZOFRAN) IV, oxyCODONE, senna-docusate, traZODone Medications Prior to Admission:  Prior to Admission medications   Medication Sig Start Date End Date Taking? Authorizing  Provider  diphenhydrAMINE (BENADRYL) 25 mg capsule Take 25 mg by mouth every 6 (six) hours as needed for itching.   Yes [provider]  HYDROcodone-acetaminophen (NORCO/VICODIN) 5-325 MG tablet Take 1 tablet by mouth every 6 (six) hours as needed. 11/07/22 11/07/23 Yes [place], Jesse J, PA-C  levETIRAcetam (KEPPRA) 750 MG tablet Take 1 tablet (750 mg total) by mouth 2 (two) times daily. 12/21/22 12/16/23 Yes Camara, Amalia Hailey, MD  LORazepam (ATIVAN) 1 MG tablet Take 1 mg by mouth 2 (two) times daily. Takes 1/2 tablet two to 3 times a day. 03/13/23  Yes [provider]  omeprazole (PRILOSEC) 40 MG capsule Take 40 mg by mouth daily. 03/16/23  Yes [provider]  OXYGEN Inhale 2 L into the lungs continuous.   Yes [provider]  traMADol (ULTRAM) 50 MG tablet Take 50 mg by mouth every 6 (six) hours as needed for moderate pain. 02/27/23  Yes [provider]  albuterol (VENTOLIN HFA) 108 (90 Base) MCG/ACT inhaler Inhale 2 puffs into the lungs every 6 (six) hours as needed for wheezing or shortness of breath. Patient not taking: Reported on 04/10/2023 11/01/21   Raliegh Ip, DO  bisoprolol (ZEBETA) 10 MG tablet Take 1 and 1/2 tablets (15 mg total) by mouth daily. Patient not taking: Reported on 04/10/2023 09/02/22   Antoine Poche, MD  Budeson-Glycopyrrol-Formoterol (BREZTRI AEROSPHERE) 160-9-4.8 MCG/ACT AERO Inhale 2 puffs into the lungs 2 (two) times daily. Patient not taking: Reported on 04/10/2023 12/09/22   Raliegh Ip, DO  desvenlafaxine (PRISTIQ) 50 MG 24 hr tablet Take 1 tablet (50 mg total) by mouth daily. (NEEDS TO BE SEEN BEFORE NEXT REFILL) Patient not taking: Reported on 04/10/2023 09/26/22   Raliegh Ip, DO  lamoTRIgine (LAMICTAL) 100 MG tablet Take 100 mg by mouth 2 (two) times daily. Patient not taking: Reported on 12/14/2022 11/24/22   [provider]  traZODone (DESYREL) 50 MG tablet Take 1 tablet (50 mg total) by mouth at  bedtime as needed for sleep. Patient not taking: Reported on 04/10/2023 03/18/22   Raliegh Ip, DO   Allergies  Allergen Reactions   Calcium Channel Blockers    Montelukast    Penicillins Hives   Codeine Rash   Nicoderm [Nicotine] Hives and Swelling   Varenicline Nausea Only    GI intolerance with higher doses   Review of Systems  Unable to perform ROS: Dementia    Physical Exam Vitals and nursing note reviewed.  Constitutional:      General: She is not in acute distress.    Appearance: She is ill-appearing.  HENT:     Mouth/Throat:     Mouth: Mucous membranes are dry.  Cardiovascular:     Rate and Rhythm: Normal rate.  Pulmonary:     Effort: Pulmonary effort is normal. No respiratory distress.  Skin:    General: Skin is warm and dry.  Neurological:     Mental Status: She is alert.     Comments: Able to tell me her name, but does not answer location question  Psychiatric:        Mood and Affect: Mood normal.        Behavior: Behavior normal.  Comments: Calm and cooperative, not fearful.     Vital Signs: BP 105/82 (BP Location: Left Arm)   Pulse (!) 101   Temp 97.7 F (36.5 C) (Oral)   Resp 18   Ht 5' 2.01" (1.575 m)   Wt 45.8 kg   SpO2 95%   BMI 18.46 kg/m  Pain Scale: 0-10   Pain Score: 0-No pain   SpO2: SpO2: 95 % O2 Device:SpO2: 95 % O2 Flow Rate: .O2 Flow Rate (L/min): 2 L/min  IO: Intake/output summary:  Intake/Output Summary (Last 24 hours) at 04/12/2023 1409 Last data filed at 04/12/2023 0900 Gross per 24 hour  Intake 60 ml  Output --  Net 60 ml    LBM: Last BM Date : 04/11/23 Baseline Weight: Weight: 45.2 kg Most recent weight: Weight: 45.8 kg     Palliative Assessment/Data:     Time In: 1300 Time Out: 1415 Time Total: 75 minutes  Greater than 50%  of this time was spent counseling and coordinating care related to the above assessment and plan.  Signed by: Katheran Awe, NP   Please contact Palliative Medicine Team  phone at [phone] for questions and concerns.  For individual provider: See Loretha Stapler

## 2023-04-12 NOTE — Progress Notes (Signed)
Patient has rested well.  Patient is alert to self, and disoriented to place time and situation. Patient was able to take medications this shift. Patient has two loose bowel movements.

## 2023-04-12 NOTE — Progress Notes (Signed)
Nutrition Follow-up  DOCUMENTATION CODES:   Underweight  INTERVENTION:   -Continue regular diet -Continue Ensure Enlive po TID, each supplement provides 350 kcal and 20 grams of protein -Continue MVI with minerals daily -Continue 500 mg vitamin C BID -Continue 220 mg zinc sulfate daily x 14 days  NUTRITION DIAGNOSIS:   Increased nutrient needs related to wound healing as evidenced by estimated needs.  Ongoing  GOAL:   Patient will meet greater than or equal to 90% of their needs  Progressing   MONITOR:   PO intake, Supplement acceptance  REASON FOR ASSESSMENT:   Malnutrition Screening Tool    ASSESSMENT:   Pt with medical history significant for hypertension, hypothyroidism, severe AAS status post TAVR, seizures, depression, COPD, chronic hypoxic respiratory failure, and malnutrition who presents for evaluation of buttock pain related to a pressure sore and family's inability to care for her.  Reviewed I/O's: 0 ml x 24 hours and +1.9 L since admission   Pt sitting up in bed at time of visit. Pt smiled at RD when greeted. She reports that she does not feel well today. She reports her appetite is poor and that "everything tastes bad". Noted pt consumed a peanut butter cracker and some peaches today. Meal completions 0-50%.   Noted pt with multiple missing teeth. Pt denies difficulty chewing or swallowing foods. Pt is drinking Ensure supplements, likely chocolate flavor. Pt also likes chocolate ice cream; RD will add Magic Cups to trays.   Reviewed wt hx; pt has experienced a 15.8% wt loss over the past year.   Medications reviewed and include keppra and 0.9% sodium chloride infusion @ 75 ml/hr.   Palliative care following for goals of care discussions. Per MD, pt unable to make medical decisions and will require guardianship.   Per TOC notes, pt family would like to revoke hospice benefits in orders to pursue SNF placement.   Labs reviewed: K: 2.8, CBGS: 127  (inpatient orders for glycemic control are none).    NUTRITION - FOCUSED PHYSICAL EXAM:  Flowsheet Row Most Recent Value  Orbital Region Severe depletion  Upper Arm Region Severe depletion  Thoracic and Lumbar Region Severe depletion  Buccal Region Severe depletion  Temple Region Severe depletion  Clavicle Bone Region Severe depletion  Clavicle and Acromion Bone Region Severe depletion  Scapular Bone Region Severe depletion  Dorsal Hand Severe depletion  Patellar Region Severe depletion  Anterior Thigh Region Severe depletion  Posterior Calf Region Severe depletion  Edema (RD Assessment) None  Hair Reviewed  Eyes Reviewed  Mouth Reviewed  Skin Reviewed  Nails Reviewed       Diet Order:   Diet Order             Diet regular Room service appropriate? Yes; Fluid consistency: Thin  Diet effective now                   EDUCATION NEEDS:   No education needs have been identified at this time  Skin:  Skin Assessment: Skin Integrity Issues: Skin Integrity Issues:: DTI, Other (Comment) DTI: sacrum Other: skin tear to lt arm  Last BM:  04/11/23 (type 6)  Height:   Ht Readings from Last 1 Encounters:  04/09/23 5' 2.01" (1.575 m)    Weight:   Wt Readings from Last 1 Encounters:  04/12/23 45.8 kg    Ideal Body Weight:  50 kg  BMI:  Body mass index is 18.46 kg/m.  Estimated Nutritional Needs:   Kcal:  1600-1800  Protein:  75-90 grams  Fluid:  > 1.6 L    Levada Schilling, RD, LDN, CDCES Registered Dietitian II Certified Diabetes Care and Education Specialist Please refer to [facility] for RD and/or RD on-call/weekend/after hours pager

## 2023-04-12 NOTE — Progress Notes (Signed)
PT Cancellation Note  Patient Details Name: Tammie Martin MRN: [id number] DOB: 05-25-1951   Cancelled Treatment:    Reason Eval/Treat Not Completed: Patient declined, no reason specified.  Patient transferring to hospice facility with on comfort measures only.     3:31 PM, 04/12/23 Ocie Bob, MPT Physical Therapist with [facility] [phone] office [phone] mobile phone

## 2023-04-12 NOTE — Care Management Important Message (Signed)
Important Message  Patient Details  Name: Tammie Martin MRN: [id number] Date of Birth: December 11, 1950   Medicare Important Message Given:  N/A - LOS <3 / Initial given by admissions     Corey Harold 04/12/2023, 11:17 AM

## 2023-04-12 NOTE — Progress Notes (Signed)
Progress Note    Tammie Martin   JXB:[id number]  DOB: 30-Dec-1950  DOA: 04/09/2023     2 PCP: Raliegh Ip, DO  Initial CC: Failure to thrive  Hospital Course: Tammie Martin is a 72 yo female with PMH  COPD with chronic hypoxic respiratory failure, severe malnutrition, failure to thrive on home hospice care, AS s/p TAVR, hypothyroidism, depression who presented to the ED on 04/09/2023 due to family's inability to care for her. She was afebrile with hypercalcemia, hypokalemia (K 2.3), hypoalbuminemia, TSH elevation (11.180), and anemia (hgb 8.3). She was given IV fluids and admitted for failure to thrive with plans for placement. Bisphosphonate and calcitonin added 8/13.   Patient is clearly unable to manage her own affairs or to make her own medical decisions. She requires a guardian for this reason.   Interval History:  No events overnight. Grossly confused and cannot answer questions appropriately. She was able to get her name and could state [facility] after a while.   Assessment and Plan:  Failure to thrive: Pt has been on home hospice for FTT, now unable to be cared for adequately in home environment.  - TOC consulted, anticipate SNF disposition. Her prognosis is likely > 2 weeks. Palliative care also consulted.   Hypokalemia: Due to poor po intake - repleting    Hypercalcemia: Albumin and T protein both low. CXR without mass/bone lesions.  - Initially suspected to be asymptomatic and due purely to dehydration though her mentation remains grossly abnormal (came in because her care needs became more intensive) and corrected Ca remains elevated - s/p 2 doses calcitonin with some response; will give 2 more doses today at higher dose and dose of lasix as well - repeat CMP in am    Chronic atrial fibrillation:  - she no longer takes eliquis  - Continue beta-blocker     COPD; chronic hypoxic respiratory failure: Appears to be at baseline.  - Continue supplemental O2,  ICS-LAMA-LABA, and as-needed albuterol     Seizure disorder:  - Continue keppra    Hypothyroidism with elevated TSH: Suspect she's not been taking/given her synthroid.  - Restarted synthroid at low dose, .     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  - Supplement vitamins empirically.    Depression  - Continue SNRI    Old records reviewed in assessment of this patient  Antimicrobials:   DVT prophylaxis:  SCDs Start: 04/09/23 2034   Code Status:   Code Status: DNR  Mobility Assessment (Last 72 Hours)     Mobility Assessment     Row Name 04/11/23 2300 04/11/23 0800 04/10/23 1940 04/10/23 1459 04/10/23 0800   Does patient have an order for bedrest or is patient medically unstable No - Continue assessment No - Continue assessment No - Continue assessment -- No - Continue assessment   What is the highest level of mobility based on the progressive mobility assessment? Level 1 (Bedfast) - Unable to balance while sitting on edge of bed Level 1 (Bedfast) - Unable to balance while sitting on edge of bed Level 3 (Stands with assist) - Balance while standing  and cannot march in place Level 3 (Stands with assist) - Balance while standing  and cannot march in place Level 1 (Bedfast) - Unable to balance while sitting on edge of bed   Is the above level different from baseline mobility prior to current illness? No - Consider discontinuing PT/OT No - Consider discontinuing PT/OT Yes -  Recommend PT order -- Yes - Recommend PT order    Row Name 04/09/23 2200           Does patient have an order for bedrest or is patient medically unstable No - Continue assessment       What is the highest level of mobility based on the progressive mobility assessment? Level 1 (Bedfast) - Unable to balance while sitting on edge of bed       Is the above level different from baseline mobility prior to current illness? Yes - Recommend PT order                 Barriers to discharge: none Disposition Plan:  SNF Status is: Inpt  Objective: Blood pressure 105/82, pulse (!) 101, temperature 97.7 F (36.5 C), temperature source Oral, resp. rate 18, height 5' 2.01" (1.575 m), weight 45.8 kg, SpO2 95%.  Examination:  Physical Exam Constitutional:      Comments: Weak and frail; NAD  HENT:     Head: Normocephalic and atraumatic.     Mouth/Throat:     Mouth: Mucous membranes are dry.  Eyes:     Extraocular Movements: Extraocular movements intact.  Cardiovascular:     Rate and Rhythm: Normal rate and regular rhythm.  Pulmonary:     Effort: Pulmonary effort is normal.     Breath sounds: Normal breath sounds.  Abdominal:     General: Bowel sounds are normal. There is no distension.     Palpations: Abdomen is soft.  Musculoskeletal:        General: No swelling.     Cervical back: Normal range of motion and neck supple.  Skin:    General: Skin is warm and dry.  Neurological:     Mental Status: She is disoriented.      Consultants:    Procedures:    Data Reviewed: Results for orders placed or performed during the hospital encounter of 04/09/23 (from the past 24 hour(s))  Basic metabolic panel     Status: Abnormal   Collection Time: 04/11/23  1:57 PM  Result Value Ref Range   Sodium 136 135 - 145 mmol/L   Potassium 3.3 (L) 3.5 - 5.1 mmol/L   Chloride 99 98 - 111 mmol/L   CO2 29 22 - 32 mmol/L   Glucose, Bld 93 70 - 99 mg/dL   BUN 10 8 - 23 mg/dL   Creatinine, Ser 1.61 0.44 - 1.00 mg/dL   Calcium 09.6 (H) 8.9 - 10.3 mg/dL   GFR, Estimated >04 >54 mL/min   Anion gap 8 5 - 15  VITAMIN D 25 Hydroxy (Vit-D Deficiency, Fractures)     Status: None   Collection Time: 04/11/23  1:57 PM  Result Value Ref Range   Vit D, 25-Hydroxy 45.09 30 - 100 ng/mL  Comprehensive metabolic panel     Status: Abnormal   Collection Time: 04/12/23  4:55 AM  Result Value Ref Range   Sodium 138 135 - 145 mmol/L   Potassium 2.8 (L) 3.5 - 5.1 mmol/L    Chloride 104 98 - 111 mmol/L   CO2 27 22 - 32 mmol/L   Glucose, Bld 94 70 - 99 mg/dL   BUN 12 8 - 23 mg/dL   Creatinine, Ser 0.98 (L) 0.44 - 1.00 mg/dL   Calcium 11.9 (H) 8.9 - 10.3 mg/dL   Total Protein 6.4 (L) 6.5 - 8.1 g/dL   Albumin 1.9 (L) 3.5 - 5.0 g/dL   AST 18 15 -  41 U/L   ALT 12 0 - 44 U/L   Alkaline Phosphatase 132 (H) 38 - 126 U/L   Total Bilirubin 0.8 0.3 - 1.2 mg/dL   GFR, Estimated >16 >10 mL/min   Anion gap 7 5 - 15  CBC     Status: Abnormal   Collection Time: 04/12/23  4:55 AM  Result Value Ref Range   WBC 11.5 (H) 4.0 - 10.5 K/uL   RBC 2.99 (L) 3.87 - 5.11 MIL/uL   Hemoglobin 8.5 (L) 12.0 - 15.0 g/dL   HCT 96.0 (L) 45.4 - 09.8 %   MCV 92.0 80.0 - 100.0 fL   MCH 28.4 26.0 - 34.0 pg   MCHC 30.9 30.0 - 36.0 g/dL   RDW 11.9 (H) 14.7 - 82.9 %   Platelets 348 150 - 400 K/uL   nRBC 0.0 0.0 - 0.2 %  MRSA Next Gen by PCR, Nasal     Status: Abnormal   Collection Time: 04/12/23  9:10 AM   Specimen: Nasal Mucosa; Nasal Swab  Result Value Ref Range   MRSA by PCR Next Gen DETECTED (A) NOT DETECTED    I have reviewed pertinent nursing notes, vitals, labs, and images as necessary. I have ordered labwork to follow up on as indicated.  I have reviewed the last notes from staff over past 24 hours. I have discussed patient's care plan and test results with nursing staff, CM/SW, and other staff as appropriate.  Time spent: Greater than 50% of the 55 minute visit was spent in counseling/coordination of care for the patient as laid out in the A&P.   LOS: 2 days   Lewie Chamber, MD [facility] 04/12/2023, 1:36 PM

## 2023-04-13 DIAGNOSIS — R627 Adult failure to thrive: Secondary | ICD-10-CM | POA: Diagnosis not present

## 2023-04-13 LAB — COMPREHENSIVE METABOLIC PANEL
ALT: 13 U/L (ref 0–44)
AST: 20 U/L (ref 15–41)
Albumin: 1.8 g/dL — ABNORMAL LOW (ref 3.5–5.0)
Alkaline Phosphatase: 123 U/L (ref 38–126)
Anion gap: 9 (ref 5–15)
BUN: 16 mg/dL (ref 8–23)
CO2: 29 mmol/L (ref 22–32)
Calcium: 9.1 mg/dL (ref 8.9–10.3)
Chloride: 101 mmol/L (ref 98–111)
Creatinine, Ser: 0.43 mg/dL — ABNORMAL LOW (ref 0.44–1.00)
GFR, Estimated: >60 mL/min (ref >60)
Glucose, Bld: 131 mg/dL — ABNORMAL HIGH (ref 70–99)
Potassium: 2.3 mmol/L — CL (ref 3.5–5.1)
Sodium: 139 mmol/L (ref 135–145)
Total Bilirubin: 0.5 mg/dL (ref 0.3–1.2)
Total Protein: 5.9 g/dL — ABNORMAL LOW (ref 6.5–8.1)

## 2023-04-13 LAB — MAGNESIUM: Magnesium: 1.5 mg/dL — ABNORMAL LOW (ref 1.7–2.4)

## 2023-04-13 MED ORDER — POTASSIUM CHLORIDE CRYS ER 20 MEQ PO TBCR
40.0000 meq | EXTENDED_RELEASE_TABLET | ORAL | Status: AC
  Administered 2023-04-13 (×3): 40 meq via ORAL
  Filled 2023-04-13 (×2): qty 2

## 2023-04-13 MED ORDER — SENNOSIDES-DOCUSATE SODIUM 8.6-50 MG PO TABS
1.0000 | ORAL_TABLET | Freq: Two times a day (BID) | ORAL | Status: DC
  Administered 2023-04-13 – 2023-04-14 (×3): 1 via ORAL
  Filled 2023-04-13 (×4): qty 1

## 2023-04-13 MED ORDER — MAGNESIUM SULFATE 2 GM/50ML IV SOLN
2.0000 g | Freq: Once | INTRAVENOUS | Status: AC
  Administered 2023-04-13: 2 g via INTRAVENOUS
  Filled 2023-04-13: qty 50

## 2023-04-13 MED ORDER — HYDROCORTISONE ACETATE 25 MG RE SUPP
25.0000 mg | Freq: Two times a day (BID) | RECTAL | Status: DC
  Administered 2023-04-13 – 2023-04-14 (×3): 25 mg via RECTAL
  Filled 2023-04-13 (×5): qty 1

## 2023-04-13 MED ORDER — POLYETHYLENE GLYCOL 3350 17 G PO PACK
17.0000 g | PACK | Freq: Every day | ORAL | Status: DC
  Administered 2023-04-13 – 2023-04-14 (×2): 17 g via ORAL
  Filled 2023-04-13 (×2): qty 1

## 2023-04-13 NOTE — Progress Notes (Signed)
Progress Note    Tammie Martin   IRJ:[id number]  DOB: November 30, 1950  DOA: 04/09/2023     3 PCP: Raliegh Ip, DO  Initial CC: Failure to thrive  Hospital Course: Ms. Marchioni is a 72 yo female with PMH  COPD with chronic hypoxic respiratory failure, severe malnutrition, failure to thrive on home hospice care, AS s/p TAVR, hypothyroidism, depression who presented to the ED on 04/09/2023 due to family's inability to care for her. She was afebrile with hypercalcemia, hypokalemia (K 2.3), hypoalbuminemia, TSH elevation (11.180), and anemia (hgb 8.3). She was given IV fluids and admitted for failure to thrive with plans for placement. Bisphosphonate and calcitonin added 8/13.   Patient is clearly unable to manage her own affairs or to make her own medical decisions. She requires a guardian for this reason.   Interval History:  No events overnight. Remains confused this morning still.  Awaiting placement.   Assessment and Plan:  Failure to thrive: Pt has been on home hospice for FTT, now unable to be cared for adequately in home environment.  - TOC consulted, anticipate SNF disposition. Her prognosis is likely > 2 weeks. Palliative care also consulted.   Hypokalemia: Due to poor po intake - replete as needed   Hypercalcemia - resolved  - Albumin and T protein both low. CXR without mass/bone lesions.  - Initially suspected to be asymptomatic and due purely to dehydration though her mentation remains grossly abnormal (came in because her care needs became more intensive) and corrected Ca remained elevated - has responded well to bisphos and calcitonin - no further correction at this time    Chronic atrial fibrillation:  - she no longer takes eliquis  - Continue beta-blocker     COPD; chronic hypoxic respiratory failure: Appears to be at baseline.  - Continue supplemental O2, ICS-LAMA-LABA, and as-needed albuterol     Seizure disorder:  - Continue keppra    Hypothyroidism  with elevated TSH: Suspect she's not been taking/given her synthroid.  - Restarted synthroid at low dose, .     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  - Supplement vitamins empirically.    Depression  - Continue SNRI    Old records reviewed in assessment of this patient  Antimicrobials:   DVT prophylaxis:  SCDs Start: 04/09/23 2034   Code Status:   Code Status: DNR  Mobility Assessment (Last 72 Hours)     Mobility Assessment     Row Name 04/13/23 0800 04/12/23 2019 04/11/23 2300 04/11/23 0800 04/10/23 1940   Does patient have an order for bedrest or is patient medically unstable No - Continue assessment No - Continue assessment No - Continue assessment No - Continue assessment No - Continue assessment   What is the highest level of mobility based on the progressive mobility assessment? Level 2 (Chairfast) - Balance while sitting on edge of bed and cannot stand Level 2 (Chairfast) - Balance while sitting on edge of bed and cannot stand Level 1 (Bedfast) - Unable to balance while sitting on edge of bed Level 1 (Bedfast) - Unable to balance while sitting on edge of bed Level 3 (Stands with assist) - Balance while standing  and cannot march in place   Is the above level different from baseline mobility prior to current illness? Yes - Recommend PT order Yes - Recommend PT order No - Consider discontinuing PT/OT No - Consider discontinuing PT/OT Yes - Recommend PT order  Mobility Assessment (Last 72 Hours)     Mobility Assessment     Row Name 04/13/23 0800 04/12/23 2019 04/11/23 2300 04/11/23 0800 04/10/23 1940   Does patient have an order for bedrest or is patient medically unstable No - Continue assessment No - Continue assessment No - Continue assessment No - Continue assessment No - Continue assessment   What is the highest level of mobility based on the progressive mobility assessment? Level 2 (Chairfast) - Balance while  sitting on edge of bed and cannot stand Level 2 (Chairfast) - Balance while sitting on edge of bed and cannot stand Level 1 (Bedfast) - Unable to balance while sitting on edge of bed Level 1 (Bedfast) - Unable to balance while sitting on edge of bed Level 3 (Stands with assist) - Balance while standing  and cannot march in place   Is the above level different from baseline mobility prior to current illness? Yes - Recommend PT order Yes - Recommend PT order No - Consider discontinuing PT/OT No - Consider discontinuing PT/OT Yes - Recommend PT order           Mobility Assessment (Last 72 Hours)     Mobility Assessment     Row Name 04/13/23 0800 04/12/23 2019 04/11/23 2300 04/11/23 0800 04/10/23 1940   Does patient have an order for bedrest or is patient medically unstable No - Continue assessment No - Continue assessment No - Continue assessment No - Continue assessment No - Continue assessment   What is the highest level of mobility based on the progressive mobility assessment? Level 2 (Chairfast) - Balance while sitting on edge of bed and cannot stand Level 2 (Chairfast) - Balance while sitting on edge of bed and cannot stand Level 1 (Bedfast) - Unable to balance while sitting on edge of bed Level 1 (Bedfast) - Unable to balance while sitting on edge of bed Level 3 (Stands with assist) - Balance while standing  and cannot march in place   Is the above level different from baseline mobility prior to current illness? Yes - Recommend PT order Yes - Recommend PT order No - Consider discontinuing PT/OT No - Consider discontinuing PT/OT Yes - Recommend PT order            Barriers to discharge: none Disposition Plan:  SNF Status is: Inpt  Objective: Blood pressure 111/76, pulse 65, temperature 98 F (36.7 C), resp. rate 20, height 5' 2.01" (1.575 m), weight 45.6 kg, SpO2 94%.  Examination:  Physical Exam Constitutional:      Comments: Weak and frail; NAD  HENT:     Head: Normocephalic  and atraumatic.     Mouth/Throat:     Mouth: Mucous membranes are dry.  Eyes:     Extraocular Movements: Extraocular movements intact.  Cardiovascular:     Rate and Rhythm: Normal rate and regular rhythm.  Pulmonary:     Effort: Pulmonary effort is normal.     Breath sounds: Normal breath sounds.  Abdominal:     General: Bowel sounds are normal. There is no distension.     Palpations: Abdomen is soft.  Musculoskeletal:        General: No swelling.     Cervical back: Normal range of motion and neck supple.  Skin:    General: Skin is warm and dry.  Neurological:     Mental Status: She is disoriented.      Consultants:    Procedures:    Data Reviewed: Results for orders  placed or performed during the hospital encounter of 04/09/23 (from the past 24 hour(s))  Comprehensive metabolic panel     Status: Abnormal   Collection Time: 04/13/23  8:24 AM  Result Value Ref Range   Sodium 139 135 - 145 mmol/L   Potassium 2.3 (LL) 3.5 - 5.1 mmol/L   Chloride 101 98 - 111 mmol/L   CO2 29 22 - 32 mmol/L   Glucose, Bld 131 (H) 70 - 99 mg/dL   BUN 16 8 - 23 mg/dL   Creatinine, Ser 4.09 (L) 0.44 - 1.00 mg/dL   Calcium 9.1 8.9 - 81.1 mg/dL   Total Protein 5.9 (L) 6.5 - 8.1 g/dL   Albumin 1.8 (L) 3.5 - 5.0 g/dL   AST 20 15 - 41 U/L   ALT 13 0 - 44 U/L   Alkaline Phosphatase 123 38 - 126 U/L   Total Bilirubin 0.5 0.3 - 1.2 mg/dL   GFR, Estimated >91 >47 mL/min   Anion gap 9 5 - 15  Magnesium     Status: Abnormal   Collection Time: 04/13/23  8:24 AM  Result Value Ref Range   Magnesium 1.5 (L) 1.7 - 2.4 mg/dL    I have reviewed pertinent nursing notes, vitals, labs, and images as necessary. I have ordered labwork to follow up on as indicated.  I have reviewed the last notes from staff over past 24 hours. I have discussed patient's care plan and test results with nursing staff, CM/SW, and other staff as appropriate.   LOS: 3 days   Lewie Chamber, MD [facility] 04/13/2023,  3:12 PM

## 2023-04-13 NOTE — Progress Notes (Signed)
Patient alert only to self. Patient slept on and off this shift. Patient was able to take her medications crushed in applesauce. Patient had several bowel movements this shift. Patient foam dressing changed on L arm.

## 2023-04-13 NOTE — Progress Notes (Signed)
OT Cancellation Note  Patient Details Name: Tammie Martin MRN: [id number] DOB: Mar 11, 1951   Cancelled Treatment:    Reason Eval/Treat Not Completed: OT screened, no needs identified, will sign off. Pt is reportedly transferring to hospice facility on comfort measures only. Pt will be removed from the OT list.   Danie Chandler OT, MOT   Danie Chandler 04/13/2023, 8:06 AM

## 2023-04-14 DIAGNOSIS — E43 Unspecified severe protein-calorie malnutrition: Secondary | ICD-10-CM

## 2023-04-14 DIAGNOSIS — R627 Adult failure to thrive: Secondary | ICD-10-CM | POA: Diagnosis not present

## 2023-04-14 DIAGNOSIS — Z515 Encounter for palliative care: Secondary | ICD-10-CM

## 2023-04-14 MED ORDER — WITCH HAZEL-GLYCERIN EX PADS: MEDICATED_PAD | CUTANEOUS | Status: DC | PRN

## 2023-04-14 MED ORDER — POLYETHYLENE GLYCOL 3350 17 G PO PACK: 17.0000 g | PACK | Freq: Every day | ORAL | Status: DC | PRN

## 2023-04-14 NOTE — Progress Notes (Addendum)
Bladder scanned pt again and she had 501cc in bladder. N. O to straight cath once

## 2023-04-14 NOTE — Progress Notes (Signed)
14 french foley placed.

## 2023-04-14 NOTE — Progress Notes (Signed)
Notified MD that pt hasn't voided this shift and the bladder scan showed 198 cc. He said recheck in couple more hours to determine what to do.

## 2023-04-14 NOTE — TOC Progression Note (Addendum)
Transition of Care [facility]) - Progression Note    Patient Details  Name: Tammie Martin MRN: [id number] Date of Birth: 12-05-1950  Transition of Care [facility]) CM/SW Contact  Elliot Gault, LCSW Phone Number: 04/14/2023, 3:06 PM  Clinical Narrative:     TOC following. Spoke with pt's granddaughter [name], this AM to update on information from [facility] admissions that they cannot offer bed at this time. Provided [name] with alternative bed offers and she and family select [facility].   Spoke with Eunice Blase at [facility]. She and her Marketing executive are working on getting approval for pt to admit there with Medicaid pending and [facility] LOG.   Once approved, pt can transfer. TOC will follow.  1607: Have confirmed with Debbie at [facility] that they can accept pt tomorrow. Message left with granddaughter requesting return call in order to update her. Weekend TOC can assist with dc.  1635: Spke with pt's granddaughter, [name], to update on above. She is agreeable to dc to [facility]. Weekend TOC to follow.  Expected Discharge Plan: Skilled Nursing Facility Barriers to Discharge: Continued Medical Work up  Expected Discharge Plan and Services In-house Referral: Clinical Social Work   Post Acute Care Choice: Skilled Nursing Facility Living arrangements for the past 2 months: Single Family Home                                       Social Determinants of Health (SDOH) Interventions SDOH Screenings   Food Insecurity: Patient Unable To Answer (04/09/2023)  Housing: Patient Unable To Answer (04/09/2023)  Transportation Needs: Patient Unable To Answer (04/09/2023)  Utilities: Patient Unable To Answer (04/09/2023)  Alcohol Screen: Low Risk  (12/19/2022)  Depression (PHQ2-9): High Risk (12/19/2022)  Financial Resource Strain: Low Risk  (12/19/2022)  Physical Activity: Inactive (12/19/2022)  Social Connections: Moderately Integrated (12/19/2022)  Stress: Stress Concern  Present (12/19/2022)  Tobacco Use: High Risk (04/12/2023)    Readmission Risk Interventions    02/02/2022    1:58 PM  Readmission Risk Prevention Plan  Post Dischage Appt Complete  Medication Screening Complete  Transportation Screening Complete

## 2023-04-14 NOTE — Plan of Care (Signed)

## 2023-04-14 NOTE — Progress Notes (Signed)
Progress Note    Tammie Martin   ZOX:[id number]  DOB: [birth year]  DOA: 04/09/2023     4 PCP: Raliegh Ip, DO  Initial CC: Failure to thrive  Hospital Course: Tammie Martin is a 72 yo female with PMH  COPD with chronic hypoxic respiratory failure, severe malnutrition, failure to thrive on home hospice care, AS s/p TAVR, hypothyroidism, depression who presented to the ED on 04/09/2023 due to family's inability to care for her. She was afebrile with hypercalcemia, hypokalemia (K 2.3), hypoalbuminemia, TSH elevation (11.180), and anemia (hgb 8.3). She was given IV fluids and admitted for failure to thrive with plans for placement. Bisphosphonate and calcitonin added 8/13.   Patient is clearly unable to manage her own affairs or to make her own medical decisions. She requires a guardian for this reason.   Interval History:  No events overnight. Remains confused this morning still.  Awaiting placement.  Still lacks capacity for any decision making  Assessment and Plan:  Failure to thrive: Pt has been on home hospice for FTT, now unable to be cared for adequately in home environment.  - TOC consulted, anticipate SNF disposition. Her prognosis is likely > 2 weeks. Palliative care also consulted. -Patient accepted at [facility] and tentative plan for discharge on Saturday   Hypokalemia: Due to poor po intake - replete as needed   Hypercalcemia - resolved  - Albumin and T protein both low. CXR without mass/bone lesions.  - Initially suspected to be asymptomatic and due purely to dehydration though her mentation remains grossly abnormal (came in because her care needs became more intensive) and corrected Ca remained elevated - has responded well to bisphos and calcitonin - no further correction at this time    Chronic atrial fibrillation:  - she no longer takes eliquis  - Continue beta-blocker     COPD; chronic hypoxic respiratory failure: Appears to be at baseline.  -  Continue supplemental O2, ICS-LAMA-LABA, and as-needed albuterol     Seizure disorder:  - Continue keppra    Hypothyroidism with elevated TSH: Suspect she's not been taking/given her synthroid.  - Restarted synthroid at low dose, .     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  - Supplement vitamins empirically.    Depression  - Continue SNRI    Old records reviewed in assessment of this patient  Antimicrobials:   DVT prophylaxis:  SCDs Start: 04/09/23 2034   Code Status:   Code Status: DNR  Mobility Assessment (Last 72 Hours)     Mobility Assessment     Row Name 04/14/23 0913 04/13/23 2200 04/13/23 0800 04/12/23 2019 04/11/23 2300   Does patient have an order for bedrest or is patient medically unstable No - Continue assessment No - Continue assessment No - Continue assessment No - Continue assessment No - Continue assessment   What is the highest level of mobility based on the progressive mobility assessment? Level 2 (Chairfast) - Balance while sitting on edge of bed and cannot stand Level 2 (Chairfast) - Balance while sitting on edge of bed and cannot stand Level 2 (Chairfast) - Balance while sitting on edge of bed and cannot stand Level 2 (Chairfast) - Balance while sitting on edge of bed and cannot stand Level 1 (Bedfast) - Unable to balance while sitting on edge of bed   Is the above level different from baseline mobility prior to current illness? Yes - Recommend PT order Yes - Recommend PT order Yes -  Recommend PT order Yes - Recommend PT order No - Consider discontinuing PT/OT           Mobility Assessment (Last 72 Hours)     Mobility Assessment     Row Name 04/14/23 0913 04/13/23 2200 04/13/23 0800 04/12/23 2019 04/11/23 2300   Does patient have an order for bedrest or is patient medically unstable No - Continue assessment No - Continue assessment No - Continue assessment No - Continue assessment No - Continue assessment    What is the highest level of mobility based on the progressive mobility assessment? Level 2 (Chairfast) - Balance while sitting on edge of bed and cannot stand Level 2 (Chairfast) - Balance while sitting on edge of bed and cannot stand Level 2 (Chairfast) - Balance while sitting on edge of bed and cannot stand Level 2 (Chairfast) - Balance while sitting on edge of bed and cannot stand Level 1 (Bedfast) - Unable to balance while sitting on edge of bed   Is the above level different from baseline mobility prior to current illness? Yes - Recommend PT order Yes - Recommend PT order Yes - Recommend PT order Yes - Recommend PT order No - Consider discontinuing PT/OT           Mobility Assessment (Last 72 Hours)     Mobility Assessment     Row Name 04/14/23 0913 04/13/23 2200 04/13/23 0800 04/12/23 2019 04/11/23 2300   Does patient have an order for bedrest or is patient medically unstable No - Continue assessment No - Continue assessment No - Continue assessment No - Continue assessment No - Continue assessment   What is the highest level of mobility based on the progressive mobility assessment? Level 2 (Chairfast) - Balance while sitting on edge of bed and cannot stand Level 2 (Chairfast) - Balance while sitting on edge of bed and cannot stand Level 2 (Chairfast) - Balance while sitting on edge of bed and cannot stand Level 2 (Chairfast) - Balance while sitting on edge of bed and cannot stand Level 1 (Bedfast) - Unable to balance while sitting on edge of bed   Is the above level different from baseline mobility prior to current illness? Yes - Recommend PT order Yes - Recommend PT order Yes - Recommend PT order Yes - Recommend PT order No - Consider discontinuing PT/OT            Barriers to discharge: none Disposition Plan:  [facility] on Saturday  Status is: Inpt  Objective: Blood pressure 99/70, pulse 69, temperature 98.1 F (36.7 C), temperature source Oral, resp. rate 19, height 5'  2.01" (1.575 m), weight 49.4 kg, SpO2 95%.  Examination:  Physical Exam Constitutional:      Comments: Weak and frail; NAD  HENT:     Head: Normocephalic and atraumatic.     Mouth/Throat:     Mouth: Mucous membranes are dry.  Eyes:     Extraocular Movements: Extraocular movements intact.  Cardiovascular:     Rate and Rhythm: Normal rate and regular rhythm.  Pulmonary:     Effort: Pulmonary effort is normal.     Breath sounds: Normal breath sounds.  Abdominal:     General: Bowel sounds are normal. There is no distension.     Palpations: Abdomen is soft.  Musculoskeletal:        General: No swelling.     Cervical back: Normal range of motion and neck supple.  Skin:    General: Skin is warm and  dry.  Neurological:     Mental Status: She is disoriented.      Consultants:    Procedures:    Data Reviewed: No results found for this or any previous visit (from the past 24 hour(s)).   I have reviewed pertinent nursing notes, vitals, labs, and images as necessary. I have ordered labwork to follow up on as indicated.  I have reviewed the last notes from staff over past 24 hours. I have discussed patient's care plan and test results with nursing staff, CM/SW, and other staff as appropriate.   LOS: 4 days   Lewie Chamber, MD [facility] 04/14/2023, 5:34 PM

## 2023-04-14 NOTE — Progress Notes (Addendum)
pt has multiple pin point holes throughout labia minora and perineum all of which are oozing liquid stool. pt has multiple ulcerations around urinary meatus and unidentifiable vaginal opening. The perineum nearest the rectum is hard and lobulated. In and out cath performed pt with identifiable pus and sediment in urine. Asked Md if patient would meet criteria for a indwelling cath d.t extensive abnormal anatomy. Questionable fistulas to both bladder and vagina. theres is nothing but pus coming out after urine was collected. Notified Dr. Frederick Peers.

## 2023-04-14 NOTE — Progress Notes (Signed)
Palliative:   Tammie Martin was admitted with failure to thrive, low albumin and hypercalcemia.  She had been active with [facility] for outpatient hospice services.  Patient and family are now seeking short-term rehab with anticipated need for long-term care placement.  Transition of care team is working closely with patient and family for placement.  At this point awaiting Medicaid approval for admission to [facility].  Conference with attending, bedside nursing staff, transition of care team related to patient condition, needs, goals of care, disposition.  Plan: At this point continue to treat the treatable but no CPR or intubation.  Anticipating discharge to short-term rehab at [facility].  Need for long-term care also anticipated.  Encouraged patient to family to continue working with hospice if possible.  No charge Tammie Carmel, NP Palliative Medicine Team  Team Phone [phone] Greater than 50% of this time was spent counseling and coordinating care related to the above assessment and plan.

## 2023-04-15 DIAGNOSIS — R627 Adult failure to thrive: Secondary | ICD-10-CM | POA: Diagnosis not present

## 2023-04-15 DIAGNOSIS — R338 Other retention of urine: Secondary | ICD-10-CM

## 2023-04-15 MED ORDER — LEVOTHYROXINE SODIUM 25 MCG PO TABS: 25.0000 ug | ORAL_TABLET | Freq: Every day | ORAL | Status: AC

## 2023-04-15 MED ORDER — WITCH HAZEL-GLYCERIN EX PADS: MEDICATED_PAD | CUTANEOUS | Status: AC | PRN

## 2023-04-15 NOTE — Plan of Care (Signed)
  Problem: Education: Goal: Knowledge of General Education information will improve Description: Including pain rating scale, medication(s)/side effects and non-pharmacologic comfort measures Outcome: Not Progressing   Problem: Health Behavior/Discharge Planning: Goal: Ability to manage health-related needs will improve Outcome: Not Progressing   

## 2023-04-15 NOTE — Discharge Summary (Incomplete Revision)
Physician Discharge Summary   CIELO SCHNEITER NFA:[id number] DOB: Feb 13, 1951 DOA: 04/09/2023  PCP: Raliegh Ip, DO  Admit date: 04/09/2023 Discharge date: 04/15/2023  Admitted From: Home Disposition:  [facility] Discharging physician: Lewie Chamber, MD Barriers to discharge: none  Recommendations at discharge: Continue hospice level care for failure to thrive Continue foley in place for concern of possible colovesical fistula   Discharge Condition: stable CODE STATUS: DNR Diet recommendation:  Diet Orders (From admission, onward)     Start     Ordered   04/15/23 0000  Diet general        04/15/23 0931   04/09/23 2034  Diet regular Room service appropriate? Yes; Fluid consistency: Thin  Diet effective now       Question Answer Comment  Room service appropriate? Yes   Fluid consistency: Thin      04/09/23 2036            Hospital Course: Ms. Bostock is a 72 yo female with PMH  COPD with chronic hypoxic respiratory failure, severe malnutrition, failure to thrive on home hospice care, AS s/p TAVR, hypothyroidism, depression who presented to the ED on 04/09/2023 due to family's inability to care for her. She was afebrile with hypercalcemia, hypokalemia (K 2.3), hypoalbuminemia, TSH elevation (11.180), and anemia (hgb 8.3). She was given IV fluids and admitted for failure to thrive with plans for placement. Bisphosphonate and calcitonin added 8/13.   Patient is clearly unable to manage her own affairs or to make her own medical decisions. She requires a guardian for this reason.   Assessment and Plan:  Failure to thrive:  Pt has been on home hospice for FTT, now unable to be cared for adequately in home environment.  - TOC consulted, anticipate SNF disposition. Her prognosis is likely > 2 weeks. Palliative care also consulted. -Patient accepted at [facility] - anticipate ongoing decline, especially with poor intake  Urinary retention - increased PVR  multiple times along with purulent drainage at times from vaginal orifice. Concern for possible underlying fistula; patient would not be a good candidate for any surgical intervention and supportive care is best option for patient - continue foley in place   Hypokalemia: Due to poor po intake - repleted   Hypercalcemia - resolved  - Albumin and T protein both low. CXR without mass/bone lesions.  - Initially suspected to be asymptomatic and due purely to dehydration though her mentation remains grossly abnormal (came in because her care needs became more intensive) and corrected Ca remained elevated - has responded well to bisphos and calcitonin - no further correction at this time    Chronic atrial fibrillation:  - she no longer takes eliquis  - Continue beta-blocker     COPD; chronic hypoxic respiratory failure: Appears to be at baseline.  - Continue supplemental O2, ICS-LAMA-LABA, and as-needed albuterol     Seizure disorder:  - Continue keppra    Hypothyroidism with elevated TSH: Suspect she's not been taking/given her synthroid.  - Restarted synthroid at low dose, .   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    Depression  - Continue SNRI     Principal Diagnosis: Failure to thrive in adult  Discharge Diagnoses: Active Hospital Problems   Diagnosis Date Noted   Failure to thrive in adult 04/09/2023    Priority: 1.   Acute urinary retention 04/15/2023    Priority: 2.  Hypokalemia 11/03/2022    Priority: 2.   A-fib (HCC) 02/07/2022    Priority: 3.   Seizure disorder (HCC) 11/03/2022    Priority: 4.   Protein-calorie malnutrition, severe 04/12/2023   Normocytic anemia 04/09/2023   Hypercalcemia 04/09/2023   Chronic respiratory failure with hypoxia (HCC) 04/09/2023   Chronic obstructive pulmonary disease (COPD) (HCC) 11/03/2022   Anxiety and depression 11/03/2022     Resolved Hospital Problems  No resolved problems to display.     Discharge Instructions     Diet general   Complete by: As directed    No wound care   Complete by: As directed       Allergies as of 04/15/2023       Reactions   Calcium Channel Blockers    Montelukast    Penicillins Hives   Codeine Rash   Nicoderm [nicotine] Hives, Swelling   Varenicline Nausea Only   GI intolerance with higher doses        Medication List     STOP taking these medications    diphenhydrAMINE 25 mg capsule Commonly known as: BENADRYL   HYDROcodone-acetaminophen 5-325 MG tablet Commonly known as: NORCO/VICODIN   lamoTRIgine 100 MG tablet Commonly known as: LAMICTAL   LORazepam 1 MG tablet Commonly known as: ATIVAN   traMADol 50 MG tablet Commonly known as: ULTRAM       TAKE these medications    albuterol 108 (90 Base) MCG/ACT inhaler Commonly known as: VENTOLIN HFA Inhale 2 puffs into the lungs every 6 (six) hours as needed for wheezing or shortness of breath.   bisoprolol 10 MG tablet Commonly known as: ZEBETA Take 1 and 1/2 tablets (15 mg total) by mouth daily.   Breztri Aerosphere 160-9-4.8 MCG/ACT Aero Generic drug: Budeson-Glycopyrrol-Formoterol Inhale 2 puffs into the lungs 2 (two) times daily.   desvenlafaxine 50 MG 24 hr tablet Commonly known as: PRISTIQ Take 1 tablet (50 mg total) by mouth daily. (NEEDS TO BE SEEN BEFORE NEXT REFILL)   levETIRAcetam 750 MG tablet Commonly known as: KEPPRA Take 1 tablet (750 mg total) by mouth 2 (two) times daily.   levothyroxine 25 MCG tablet Commonly known as: SYNTHROID Take 1 tablet (25 mcg total) by mouth daily at 6 (six) AM. Start taking on: April 16, 2023   omeprazole 40 MG capsule Commonly known as: PRILOSEC Take 40 mg by mouth daily.   OXYGEN Inhale 2 L into the lungs continuous.   traZODone 50 MG tablet Commonly known as: DESYREL Take 1 tablet (50 mg total) by mouth at bedtime as needed for sleep.    witch hazel-glycerin pad Commonly known as: TUCKS Apply topically as needed for itching, irritation or hemorrhoids.        Allergies  Allergen Reactions   Calcium Channel Blockers    Montelukast    Penicillins Hives   Codeine Rash   Nicoderm [Nicotine] Hives and Swelling   Varenicline Nausea Only    GI intolerance with higher doses    Consultations:   Procedures:   Discharge Exam: BP 121/88 (BP Location: Right Arm)   Pulse 88   Temp (!) 97.5 F (36.4 C) (Oral)   Resp 16   Ht 5' 2.01" (1.575 m)   Wt 47.2 kg   SpO2 92%   BMI 19.03 kg/m  Physical Exam Constitutional:      Comments: Weak and frail; NAD  HENT:     Head: Normocephalic and atraumatic.     Mouth/Throat:     Mouth: Mucous  membranes are dry.  Eyes:     Extraocular Movements: Extraocular movements intact.  Cardiovascular:     Rate and Rhythm: Normal rate and regular rhythm.  Pulmonary:     Effort: Pulmonary effort is normal.     Breath sounds: Normal breath sounds.  Abdominal:     General: Bowel sounds are normal. There is no distension.     Palpations: Abdomen is soft.  Genitourinary:    Comments: Foley in place with yellow urine noted in bag Musculoskeletal:        General: No swelling.     Cervical back: Normal range of motion and neck supple.  Skin:    General: Skin is warm and dry.  Neurological:     Mental Status: She is disoriented.      The results of significant diagnostics from this hospitalization (including imaging, microbiology, ancillary and laboratory) are listed below for reference.   Microbiology: Recent Results (from the past 240 hour(s))  MRSA Next Gen by PCR, Nasal     Status: Abnormal   Collection Time: 04/12/23  9:10 AM   Specimen: Nasal Mucosa; Nasal Swab  Result Value Ref Range Status   MRSA by PCR Next Gen DETECTED (A) NOT DETECTED Final    Comment: RESULT CALLED TO, READ BACK BY AND VERIFIED WITH: RENEE T, RN @ 1112 ON 04/12/23 C VARNER (NOTE) The GeneXpert  MRSA Assay (FDA approved for NASAL specimens only), is one component of a comprehensive MRSA colonization surveillance program. It is not intended to diagnose MRSA infection nor to guide or monitor treatment for MRSA infections. Test performance is not FDA approved in patients less than 74 years old. Performed at [facility], [address] [place], [place] [address]      Labs: BNP (last 3 results) No results for input(s): "BNP" in the last 8760 hours. Basic Metabolic Panel: Recent Labs  Lab 04/09/23 1844 04/10/23 0542 04/11/23 0443 04/11/23 1357 04/12/23 0455 04/13/23 0824  NA 135 136 135 136 138 139  K 2.3* 2.8* 2.7* 3.3* 2.8* 2.3*  CL 95* 98 101 99 104 101  CO2 32 29 27 29 27 29   GLUCOSE 114* 95 86 93 94 131*  BUN 14 11 11 10 12 16   CREATININE 0.50 0.44 0.45 0.54 0.43* 0.43*  CALCIUM 12.2* 11.8* 11.5* 11.7* 10.5* 9.1  MG 1.7 1.9  --   --   --  1.5*   Liver Function Tests: Recent Labs  Lab 04/09/23 1844 04/12/23 0455 04/13/23 0824  AST 17 18 20   ALT 13 12 13   ALKPHOS 115 132* 123  BILITOT 0.7 0.8 0.5  PROT 6.3* 6.4* 5.9*  ALBUMIN 2.0* 1.9* 1.8*   No results for input(s): "LIPASE", "AMYLASE" in the last 168 hours. No results for input(s): "AMMONIA" in the last 168 hours. CBC: Recent Labs  Lab 04/09/23 1844 04/10/23 0542 04/11/23 0443 04/12/23 0455  WBC 10.7* 10.4 10.6* 11.5*  NEUTROABS 7.9*  --   --   --   HGB 8.3* 8.2* 7.7* 8.5*  HCT 26.9* 27.0* 25.3* 27.5*  MCV 90.9 92.2 92.0 92.0  PLT 393 396 393 348   Cardiac Enzymes: No results for input(s): "CKTOTAL", "CKMB", "CKMBINDEX", "TROPONINI" in the last 168 hours. BNP: Invalid input(s): "POCBNP" CBG: No results for input(s): "GLUCAP" in the last 168 hours. D-Dimer No results for input(s): "DDIMER" in the last 72 hours. Hgb A1c No results for input(s): "HGBA1C" in the last 72 hours. Lipid Profile No results for input(s): "CHOL", "HDL", "  LDLCALC", "TRIG", "CHOLHDL", "LDLDIRECT" in the last 72  hours. Thyroid function studies No results for input(s): "TSH", "T4TOTAL", "T3FREE", "THYROIDAB" in the last 72 hours.  Invalid input(s): "FREET3" Anemia work up No results for input(s): "VITAMINB12", "FOLATE", "FERRITIN", "TIBC", "IRON", "RETICCTPCT" in the last 72 hours. Urinalysis    Component Value Date/Time   COLORURINE YELLOW 12/14/2022 2201   APPEARANCEUR CLEAR 12/14/2022 2201   LABSPEC 1.020 12/14/2022 2201   PHURINE 5.0 12/14/2022 2201   GLUCOSEU NEGATIVE 12/14/2022 2201   HGBUR NEGATIVE 12/14/2022 2201   BILIRUBINUR NEGATIVE 12/14/2022 2201   KETONESUR NEGATIVE 12/14/2022 2201   PROTEINUR 30 (A) 12/14/2022 2201   NITRITE NEGATIVE 12/14/2022 2201   LEUKOCYTESUR NEGATIVE 12/14/2022 2201   Sepsis Labs Recent Labs  Lab 04/09/23 1844 04/10/23 0542 04/11/23 0443 04/12/23 0455  WBC 10.7* 10.4 10.6* 11.5*   Microbiology Recent Results (from the past 240 hour(s))  MRSA Next Gen by PCR, Nasal     Status: Abnormal   Collection Time: 04/12/23  9:10 AM   Specimen: Nasal Mucosa; Nasal Swab  Result Value Ref Range Status   MRSA by PCR Next Gen DETECTED (A) NOT DETECTED Final    Comment: RESULT CALLED TO, READ BACK BY AND VERIFIED WITH: RENEE T, RN @ 1112 ON 04/12/23 C VARNER (NOTE) The GeneXpert MRSA Assay (FDA approved for NASAL specimens only), is one component of a comprehensive MRSA colonization surveillance program. It is not intended to diagnose MRSA infection nor to guide or monitor treatment for MRSA infections. Test performance is not FDA approved in patients less than 11 years old. Performed at [facility], [address] [place], [place] [address]     Procedures/Studies: [facility] Chest Port 1 View  Result Date: 04/09/2023 CLINICAL DATA:  Weakness EXAM: PORTABLE CHEST 1 VIEW COMPARISON:  Chest radiograph dated 02/12/2023 FINDINGS: Patient is rotated slightly to the right. Normal lung volumes. Improved right retrocardiac opacity with a small focus of residual  atelectasis. No pleural effusion or pneumothorax. Decreased cardiomegaly. No acute osseous abnormality. IMPRESSION: 1. Improved right retrocardiac opacity with a small focus of residual atelectasis. 2. Decreased cardiomegaly. Electronically Signed   By: Agustin Cree M.D.   On: 04/09/2023 19:09     Time coordinating discharge: Over 30 minutes    Lewie Chamber, MD  [facility] 04/15/2023, 9:35 AM

## 2023-04-15 NOTE — Discharge Summary (Signed)
Physician Discharge Summary   Tammie Martin ZOX:[id number] DOB: [birth year] DOA: 04/09/2023  PCP: Tammie Ip, DO  Admit date: 04/09/2023 Discharge date: 04/17/2023  Admitted From: Home Disposition:  Tammie Martin Discharging physician: Tammie Chamber, MD Barriers to discharge: none  Recommendations at discharge: Recommend transition to hospice for failure to thrive Continue foley in place for concern of possible colovesical fistula and/or urinary retention Became oversedated on pain medication but if pain becomes uncontrolled, may need resumption of opioids especially in setting of failure to thrive and/or with hospice involvement    Discharge Condition: stable CODE STATUS: DNR Diet recommendation:  Diet Orders (From admission, onward)     Start     Ordered   04/17/23 0000  Diet general        04/17/23 1203   04/15/23 0000  Diet general        04/15/23 0931   04/09/23 2034  Diet regular Room service appropriate? Yes; Fluid consistency: Thin  Diet effective now       Question Answer Comment  Room service appropriate? Yes   Fluid consistency: Thin      04/09/23 2036            Hospital Course: [name] is a 72 yo female with PMH  COPD with chronic hypoxic respiratory failure, severe malnutrition, failure to thrive on home hospice care, AS s/p TAVR, hypothyroidism, depression who presented to the ED on 04/09/2023 due to family's inability to care for her. She was afebrile with hypercalcemia, hypokalemia (K 2.3), hypoalbuminemia, TSH elevation (11.180), and anemia (hgb 8.3). She was given IV fluids and admitted for failure to thrive with plans for placement. Bisphosphonate and calcitonin added 8/13.   Patient is clearly unable to manage her own affairs or to make her own medical decisions. She requires a guardian for this reason.   Assessment and Plan:  Failure to thrive: Pt has been on home hospice for FTT, now unable to be cared for adequately in home  environment.  - TOC consulted, anticipate SNF. Palliative care also consulted. - nutrition continues to decline; if this continues she will start to further imminently decline; recommend ongoing discussions regarding hospice and pursuit of comfort for expectant end of life -Patient accepted at [facility]   Urinary retention - increased PVR multiple times along with purulent drainage at times from vaginal orifice. Concern for possible underlying fistula; patient would not be a good candidate for any surgical intervention and supportive care is best option for patient - continue foley in place   Hypokalemia: Due to poor po intake - repleted   Hypercalcemia - resolved  - Albumin and T protein both low. CXR without mass/bone lesions.  - Initially suspected to be asymptomatic and due purely to dehydration though her mentation remains grossly abnormal (came in because her care needs became more intensive) and corrected Ca remained elevated - has responded well to bisphos and calcitonin - no further correction at this time    Chronic atrial fibrillation:  - she no longer takes eliquis  - Continue beta-blocker     COPD; chronic hypoxic respiratory failure: Appears to be at baseline.  - Continue supplemental O2, ICS-LAMA-LABA, and as-needed albuterol     Seizure disorder:  - Continue keppra    Hypothyroidism with elevated TSH: Suspect she's not been taking/given her synthroid.  - Restarted synthroid at low dose, .     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  - Supplement vitamins empirically.  Depression  - Continue SNRI    Principal Diagnosis: Failure to thrive in adult  Discharge Diagnoses: Active Hospital Problems   Diagnosis Date Noted   Failure to thrive in adult 04/09/2023    Priority: 1.   Acute urinary retention 04/15/2023    Priority: 2.   Hypokalemia 11/03/2022    Priority: 2.   A-fib (HCC) 02/07/2022    Priority: 3.    Seizure disorder (HCC) 11/03/2022    Priority: 4.   Protein-calorie malnutrition, severe 04/12/2023   Normocytic anemia 04/09/2023   Hypercalcemia 04/09/2023   Chronic respiratory failure with hypoxia (HCC) 04/09/2023   Chronic obstructive pulmonary disease (COPD) (HCC) 11/03/2022   Anxiety and depression 11/03/2022    Resolved Hospital Problems  No resolved problems to display.     Discharge Instructions     Diet general   Complete by: As directed    Diet general   Complete by: As directed    Increase activity slowly   Complete by: As directed    No wound care   Complete by: As directed    No wound care   Complete by: As directed       Allergies as of 04/17/2023       Reactions   Calcium Channel Blockers    Montelukast    Penicillins Hives   Codeine Rash   Nicoderm [nicotine] Hives, Swelling   Varenicline Nausea Only   GI intolerance with higher doses        Medication List     STOP taking these medications    diphenhydrAMINE 25 mg capsule Commonly known as: BENADRYL   HYDROcodone-acetaminophen 5-325 MG tablet Commonly known as: NORCO/VICODIN   lamoTRIgine 100 MG tablet Commonly known as: LAMICTAL   LORazepam 1 MG tablet Commonly known as: ATIVAN   traMADol 50 MG tablet Commonly known as: ULTRAM       TAKE these medications    albuterol 108 (90 Base) MCG/ACT inhaler Commonly known as: VENTOLIN HFA Inhale 2 puffs into the lungs every 6 (six) hours as needed for wheezing or shortness of breath.   bisoprolol 10 MG tablet Commonly known as: ZEBETA Take 1 and 1/2 tablets (15 mg total) by mouth daily.   Breztri Aerosphere 160-9-4.8 MCG/ACT Aero Generic drug: Budeson-Glycopyrrol-Formoterol Inhale 2 puffs into the lungs 2 (two) times daily.   desvenlafaxine 50 MG 24 hr tablet Commonly known as: PRISTIQ Take 1 tablet (50 mg total) by mouth daily. (NEEDS TO BE SEEN BEFORE NEXT REFILL)   levETIRAcetam 750 MG tablet Commonly known as:  KEPPRA Take 1 tablet (750 mg total) by mouth 2 (two) times daily.   levothyroxine 25 MCG tablet Commonly known as: SYNTHROID Take 1 tablet (25 mcg total) by mouth daily at 6 (six) AM.   omeprazole 40 MG capsule Commonly known as: PRILOSEC Take 40 mg by mouth daily.   OXYGEN Inhale 2 L into the lungs continuous.   traZODone 50 MG tablet Commonly known as: DESYREL Take 1 tablet (50 mg total) by mouth at bedtime as needed for sleep.   witch hazel-glycerin pad Commonly known as: TUCKS Apply topically as needed for itching, irritation or hemorrhoids.        Allergies  Allergen Reactions   Calcium Channel Blockers    Montelukast    Penicillins Hives   Codeine Rash   Nicoderm [Nicotine] Hives and Swelling   Varenicline Nausea Only    GI intolerance with higher doses    Consultations: Palliative care  Discharge Exam:  BP (!) 83/50 (BP Location: Right Arm)   Pulse 76   Temp 97.6 F (36.4 C) (Axillary)   Resp 16   Ht 5' 2.01" (1.575 m)   Wt 47.1 kg   SpO2 98%   BMI 18.99 kg/m  Physical Exam Constitutional:      Comments: Weak and frail; NAD; more tired and lethargic today  HENT:     Head: Normocephalic and atraumatic.     Mouth/Throat:     Mouth: Mucous membranes are dry.  Eyes:     Extraocular Movements: Extraocular movements intact.  Cardiovascular:     Rate and Rhythm: Normal rate and regular rhythm.  Pulmonary:     Effort: Pulmonary effort is normal.     Breath sounds: Normal breath sounds.  Abdominal:     General: Bowel sounds are normal. There is no distension.     Palpations: Abdomen is soft.  Genitourinary:    Comments: Foley in place with yellow urine noted in bag Musculoskeletal:        General: No swelling.     Cervical back: Normal range of motion and neck supple.  Skin:    General: Skin is warm and dry.  Neurological:     Mental Status: She is disoriented.      The results of significant diagnostics from this hospitalization (including  imaging, microbiology, ancillary and laboratory) are listed below for reference.   Microbiology: Recent Results (from the past 240 hour(s))  MRSA Next Gen by PCR, Nasal     Status: Abnormal   Collection Time: 04/12/23  9:10 AM   Specimen: Nasal Mucosa; Nasal Swab  Result Value Ref Range Status   MRSA by PCR Next Gen DETECTED (A) NOT DETECTED Final    Comment: RESULT CALLED TO, READ BACK BY AND VERIFIED WITH: RENEE T, RN @ 1112 ON 04/12/23 C VARNER (NOTE) The GeneXpert MRSA Assay (FDA approved for NASAL specimens only), is one component of a comprehensive MRSA colonization surveillance program. It is not intended to diagnose MRSA infection nor to guide or monitor treatment for MRSA infections. Test performance is not FDA approved in patients less than 47 years old. Performed at [facility], [address], [place], [place] [address]      Labs: BNP (last 3 results) No results for input(s): "BNP" in the last 8760 hours. Basic Metabolic Panel: Recent Labs  Lab 04/11/23 0443 04/11/23 1357 04/12/23 0455 04/13/23 0824  NA 135 136 138 139  K 2.7* 3.3* 2.8* 2.3*  CL 101 99 104 101  CO2 27 29 27 29   GLUCOSE 86 93 94 131*  BUN 11 10 12 16   CREATININE 0.45 0.54 0.43* 0.43*  CALCIUM 11.5* 11.7* 10.5* 9.1  MG  --   --   --  1.5*   Liver Function Tests: Recent Labs  Lab 04/12/23 0455 04/13/23 0824  AST 18 20  ALT 12 13  ALKPHOS 132* 123  BILITOT 0.8 0.5  PROT 6.4* 5.9*  ALBUMIN 1.9* 1.8*   No results for input(s): "LIPASE", "AMYLASE" in the last 168 hours. No results for input(s): "AMMONIA" in the last 168 hours. CBC: Recent Labs  Lab 04/11/23 0443 04/12/23 0455  WBC 10.6* 11.5*  HGB 7.7* 8.5*  HCT 25.3* 27.5*  MCV 92.0 92.0  PLT 393 348   Cardiac Enzymes: No results for input(s): "CKTOTAL", "CKMB", "CKMBINDEX", "TROPONINI" in the last 168 hours. BNP: Invalid input(s): "POCBNP" CBG: No results for input(s): "GLUCAP" in the last 168 hours. D-Dimer No results  for  input(s): "DDIMER" in the last 72 hours. Hgb A1c No results for input(s): "HGBA1C" in the last 72 hours. Lipid Profile No results for input(s): "CHOL", "HDL", "LDLCALC", "TRIG", "CHOLHDL", "LDLDIRECT" in the last 72 hours. Thyroid function studies No results for input(s): "TSH", "T4TOTAL", "T3FREE", "THYROIDAB" in the last 72 hours.  Invalid input(s): "FREET3" Anemia work up No results for input(s): "VITAMINB12", "FOLATE", "FERRITIN", "TIBC", "IRON", "RETICCTPCT" in the last 72 hours. Urinalysis    Component Value Date/Time   COLORURINE YELLOW 12/14/2022 2201   APPEARANCEUR CLEAR 12/14/2022 2201   LABSPEC 1.020 12/14/2022 2201   PHURINE 5.0 12/14/2022 2201   GLUCOSEU NEGATIVE 12/14/2022 2201   HGBUR NEGATIVE 12/14/2022 2201   BILIRUBINUR NEGATIVE 12/14/2022 2201   KETONESUR NEGATIVE 12/14/2022 2201   PROTEINUR 30 (A) 12/14/2022 2201   NITRITE NEGATIVE 12/14/2022 2201   LEUKOCYTESUR NEGATIVE 12/14/2022 2201   Sepsis Labs Recent Labs  Lab 04/11/23 0443 04/12/23 0455  WBC 10.6* 11.5*   Microbiology Recent Results (from the past 240 hour(s))  MRSA Next Gen by PCR, Nasal     Status: Abnormal   Collection Time: 04/12/23  9:10 AM   Specimen: Nasal Mucosa; Nasal Swab  Result Value Ref Range Status   MRSA by PCR Next Gen DETECTED (A) NOT DETECTED Final    Comment: RESULT CALLED TO, READ BACK BY AND VERIFIED WITH: RENEE T, RN @ 1112 ON 04/12/23 C VARNER (NOTE) The GeneXpert MRSA Assay (FDA approved for NASAL specimens only), is one component of a comprehensive MRSA colonization surveillance program. It is not intended to diagnose MRSA infection nor to guide or monitor treatment for MRSA infections. Test performance is not FDA approved in patients less than 51 years old. Performed at [facility], [address], [place], [place] [address]     Procedures/Studies: [facility] Chest Port 1 View  Result Date: 04/09/2023 CLINICAL DATA:  Weakness EXAM: PORTABLE CHEST 1 VIEW COMPARISON:   Chest radiograph dated 02/12/2023 FINDINGS: Patient is rotated slightly to the right. Normal lung volumes. Improved right retrocardiac opacity with a small focus of residual atelectasis. No pleural effusion or pneumothorax. Decreased cardiomegaly. No acute osseous abnormality. IMPRESSION: 1. Improved right retrocardiac opacity with a small focus of residual atelectasis. 2. Decreased cardiomegaly. Electronically Signed   By: Agustin Cree M.D.   On: 04/09/2023 19:09     Time coordinating discharge: Over 30 minutes    Tammie Chamber, MD  [facility] 04/17/2023, 12:03 PM

## 2023-04-15 NOTE — TOC Progression Note (Addendum)
Transition of Care [facility]) - Progression Note    Patient Details  Name: Tammie Martin MRN: [id number] Date of Birth: 1950-10-06  Transition of Care [facility]) CM/SW Contact  Catalina [place], [place] Phone Number: 04/15/2023, 10:21 AM  Clinical Narrative:    MD entered DC summary, CSW received a call from SNF.  They looked at pt and realized that she is a Level 2 and her PASSR expired April 2024 and cannot be accepted until a PASSR reviewed and they receive a new number.  CSW will request PASSR review, advised MD facility is waiting on that information. TOC to follow.   Addendum: MUST ID for reference is [phone]. Still screening, will watch. Called grand daughter to advise no DC today. Uploaded requested docs to [facility] and messaged advising.  Expected Discharge Plan: Skilled Nursing Facility Barriers to Discharge: No SNF bed  Expected Discharge Plan and Services In-house Referral: Clinical Social Work   Post Acute Care Choice: Skilled Nursing Facility Living arrangements for the past 2 months: Single Family Home Expected Discharge Date: 04/15/23                                     Social Determinants of Health (SDOH) Interventions SDOH Screenings   Food Insecurity: Patient Unable To Answer (04/09/2023)  Housing: Patient Unable To Answer (04/09/2023)  Transportation Needs: Patient Unable To Answer (04/09/2023)  Utilities: Patient Unable To Answer (04/09/2023)  Alcohol Screen: Low Risk  (12/19/2022)  Depression (PHQ2-9): High Risk (12/19/2022)  Financial Resource Strain: Low Risk  (12/19/2022)  Physical Activity: Inactive (12/19/2022)  Social Connections: Moderately Integrated (12/19/2022)  Stress: Stress Concern Present (12/19/2022)  Tobacco Use: High Risk (04/12/2023)    Readmission Risk Interventions    02/02/2022    1:58 PM  Readmission Risk Prevention Plan  Post Dischage Appt Complete  Medication Screening Complete  Transportation Screening Complete

## 2023-04-15 NOTE — Progress Notes (Addendum)
2000 Went into patient's room to introduce myself. I asked was she hurting and she simply shook her head no.  2200 Patient will not wake up long enough to take her medicine. I do not feel comfortable giving her anything by mouth at this time 0115 Went into room to clean patient. She starting swinging her legs at myself and tech. We cleaned her and even turned her. However, several minutes after we cleaned her she continued to yell out for help and asking [place] to quit and nobody was in the room.  0300 patient finally resting quietly with eyes closed.

## 2023-04-15 NOTE — Progress Notes (Signed)
Pt confused knows name but that's it. Refusing all medications and refusing her breakfast this morning. MD made aware. Pt reports no pain at this present time.

## 2023-04-15 NOTE — Progress Notes (Signed)
Progress Note    Tammie Martin   ZOX:[id number]  DOB: 04/27/1951  DOA: 04/09/2023     5 PCP: Raliegh Ip, DO  Initial CC: Failure to thrive  Hospital Course: Ms. Tammie Martin is a 72 yo female with PMH  COPD with chronic hypoxic respiratory failure, severe malnutrition, failure to thrive on home hospice care, AS s/p TAVR, hypothyroidism, depression who presented to the ED on 04/09/2023 due to family's inability to care for her. She was afebrile with hypercalcemia, hypokalemia (K 2.3), hypoalbuminemia, TSH elevation (11.180), and anemia (hgb 8.3). She was given IV fluids and admitted for failure to thrive with plans for placement. Bisphosphonate and calcitonin added 8/13.   Patient is clearly unable to manage her own affairs or to make her own medical decisions. She requires a guardian for this reason.   Interval History:  No events overnight. Was to d/c today but PASSR expired. Foley in place with yellow urine noted.   Assessment and Plan:  Failure to thrive: Pt has been on home hospice for FTT, now unable to be cared for adequately in home environment.  - TOC consulted, anticipate SNF disposition. Her prognosis is likely > 2 weeks. Palliative care also consulted. -Patient accepted at [facility] and is stable for d/c; now awaiting new PASSR - anticipate ongoing decline, especially with poor intake   Urinary retention - increased PVR multiple times along with purulent drainage at times from vaginal orifice. Concern for possible underlying fistula; patient would not be a good candidate for any surgical intervention and supportive care is best option for patient - continue foley in place   Hypokalemia: Due to poor po intake - replete as needed   Hypercalcemia - resolved  - Albumin and T protein both low. CXR without mass/bone lesions.  - Initially suspected to be asymptomatic and due purely to dehydration though her mentation remains grossly abnormal (came in because her  care needs became more intensive) and corrected Ca remained elevated - has responded well to bisphos and calcitonin - no further correction at this time    Chronic atrial fibrillation:  - she no longer takes eliquis  - Continue beta-blocker     COPD; chronic hypoxic respiratory failure: Appears to be at baseline.  - Continue supplemental O2, ICS-LAMA-LABA, and as-needed albuterol     Seizure disorder:  - Continue keppra    Hypothyroidism with elevated TSH: Suspect she's not been taking/given her synthroid.  - Restarted synthroid at low dose, .     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  - Supplement vitamins empirically.    Depression  - Continue SNRI    Old records reviewed in assessment of this patient  Antimicrobials:   DVT prophylaxis:  SCDs Start: 04/09/23 2034   Code Status:   Code Status: DNR  Mobility Assessment (Last 72 Hours)     Mobility Assessment     Row Name 04/15/23 0845 04/14/23 1954 04/14/23 0913 04/13/23 2200 04/13/23 0800   Does patient have an order for bedrest or is patient medically unstable Yes- Bedfast (Level 1) - Complete Yes- Bedfast (Level 1) - Complete No - Continue assessment No - Continue assessment No - Continue assessment   What is the highest level of mobility based on the progressive mobility assessment? -- Level 1 (Bedfast) - Unable to balance while sitting on edge of bed Level 2 (Chairfast) - Balance while sitting on edge of bed and cannot stand Level 2 (Chairfast) - Balance while  sitting on edge of bed and cannot stand Level 2 (Chairfast) - Balance while sitting on edge of bed and cannot stand   Is the above level different from baseline mobility prior to current illness? -- Yes - Recommend PT order Yes - Recommend PT order Yes - Recommend PT order Yes - Recommend PT order    Row Name 04/12/23 2019           Does patient have an order for bedrest or is patient medically unstable No -  Continue assessment       What is the highest level of mobility based on the progressive mobility assessment? Level 2 (Chairfast) - Balance while sitting on edge of bed and cannot stand       Is the above level different from baseline mobility prior to current illness? Yes - Recommend PT order               Mobility Assessment (Last 72 Hours)     Mobility Assessment     Row Name 04/15/23 0845 04/14/23 1954 04/14/23 0913 04/13/23 2200 04/13/23 0800   Does patient have an order for bedrest or is patient medically unstable Yes- Bedfast (Level 1) - Complete Yes- Bedfast (Level 1) - Complete No - Continue assessment No - Continue assessment No - Continue assessment   What is the highest level of mobility based on the progressive mobility assessment? -- Level 1 (Bedfast) - Unable to balance while sitting on edge of bed Level 2 (Chairfast) - Balance while sitting on edge of bed and cannot stand Level 2 (Chairfast) - Balance while sitting on edge of bed and cannot stand Level 2 (Chairfast) - Balance while sitting on edge of bed and cannot stand   Is the above level different from baseline mobility prior to current illness? -- Yes - Recommend PT order Yes - Recommend PT order Yes - Recommend PT order Yes - Recommend PT order    Row Name 04/12/23 2019           Does patient have an order for bedrest or is patient medically unstable No - Continue assessment       What is the highest level of mobility based on the progressive mobility assessment? Level 2 (Chairfast) - Balance while sitting on edge of bed and cannot stand       Is the above level different from baseline mobility prior to current illness? Yes - Recommend PT order               Mobility Assessment (Last 72 Hours)     Mobility Assessment     Row Name 04/15/23 0845 04/14/23 1954 04/14/23 0913 04/13/23 2200 04/13/23 0800   Does patient have an order for bedrest or is patient medically unstable Yes- Bedfast (Level 1) - Complete  Yes- Bedfast (Level 1) - Complete No - Continue assessment No - Continue assessment No - Continue assessment   What is the highest level of mobility based on the progressive mobility assessment? -- Level 1 (Bedfast) - Unable to balance while sitting on edge of bed Level 2 (Chairfast) - Balance while sitting on edge of bed and cannot stand Level 2 (Chairfast) - Balance while sitting on edge of bed and cannot stand Level 2 (Chairfast) - Balance while sitting on edge of bed and cannot stand   Is the above level different from baseline mobility prior to current illness? -- Yes - Recommend PT order Yes - Recommend PT order Yes - Recommend  PT order Yes - Recommend PT order    Row Name 04/12/23 2019           Does patient have an order for bedrest or is patient medically unstable No - Continue assessment       What is the highest level of mobility based on the progressive mobility assessment? Level 2 (Chairfast) - Balance while sitting on edge of bed and cannot stand       Is the above level different from baseline mobility prior to current illness? Yes - Recommend PT order                Barriers to discharge: none Disposition Plan:  [facility] Status is: Inpt  Objective: Blood pressure 93/68, pulse 76, temperature 97.8 F (36.6 C), temperature source Axillary, resp. rate 20, height 5' 2.01" (1.575 m), weight 47.2 kg, SpO2 94%.  Examination:  Physical Exam Constitutional:      Comments: Weak and frail; NAD  HENT:     Head: Normocephalic and atraumatic.     Mouth/Throat:     Mouth: Mucous membranes are dry.  Eyes:     Extraocular Movements: Extraocular movements intact.  Cardiovascular:     Rate and Rhythm: Normal rate and regular rhythm.  Pulmonary:     Effort: Pulmonary effort is normal.     Breath sounds: Normal breath sounds.  Abdominal:     General: Bowel sounds are normal. There is no distension.     Palpations: Abdomen is soft.  Musculoskeletal:        General: No  swelling.     Cervical back: Normal range of motion and neck supple.  Skin:    General: Skin is warm and dry.  Neurological:     Mental Status: She is disoriented.      Consultants:    Procedures:    Data Reviewed: No results found for this or any previous visit (from the past 24 hour(s)).   I have reviewed pertinent nursing notes, vitals, labs, and images as necessary. I have ordered labwork to follow up on as indicated.  I have reviewed the last notes from staff over past 24 hours. I have discussed patient's care plan and test results with nursing staff, CM/SW, and other staff as appropriate.   LOS: 5 days   Lewie Chamber, MD [facility] 04/15/2023, 3:09 PM

## 2023-04-16 DIAGNOSIS — R627 Adult failure to thrive: Secondary | ICD-10-CM | POA: Diagnosis not present

## 2023-04-16 DIAGNOSIS — R338 Other retention of urine: Secondary | ICD-10-CM | POA: Diagnosis not present

## 2023-04-16 MED ORDER — ACETAMINOPHEN 325 MG PO TABS: 650.0000 mg | ORAL_TABLET | ORAL | Status: DC | PRN

## 2023-04-16 MED ORDER — OXYCODONE HCL ER 10 MG PO T12A
10.0000 mg | EXTENDED_RELEASE_TABLET | Freq: Two times a day (BID) | ORAL | Status: DC
  Administered 2023-04-16: 10 mg via ORAL
  Filled 2023-04-16 (×2): qty 1

## 2023-04-16 MED ORDER — ACETAMINOPHEN 650 MG RE SUPP: 650.0000 mg | Freq: Four times a day (QID) | RECTAL | Status: DC | PRN

## 2023-04-16 NOTE — TOC Progression Note (Signed)
Transition of Care [facility]) - Progression Note    Patient Details  Name: Tammie Martin MRN: [id number] Date of Birth: 1950/09/17  Transition of Care [facility]) CM/SW Contact  Catalina Gravel, [place] Phone Number: 04/16/2023, 4:37 PM  Clinical Narrative:     CSW was on floor, nursing staff for pt shared that pt's grandaughter, shared that she wants pt to DC to Hospice, not SNF. Pt waiting for PASSR approval. TOC to follow.  Expected Discharge Plan: Skilled Nursing Facility Barriers to Discharge: Continued Medical Work up  Expected Discharge Plan and Services In-house Referral: Clinical Social Work   Post Acute Care Choice: Skilled Nursing Facility Living arrangements for the past 2 months: Single Family Home Expected Discharge Date: 04/15/23                                     Social Determinants of Health (SDOH) Interventions SDOH Screenings   Food Insecurity: Patient Unable To Answer (04/09/2023)  Housing: Patient Unable To Answer (04/09/2023)  Transportation Needs: Patient Unable To Answer (04/09/2023)  Utilities: Patient Unable To Answer (04/09/2023)  Alcohol Screen: Low Risk  (12/19/2022)  Depression (PHQ2-9): High Risk (12/19/2022)  Financial Resource Strain: Low Risk  (12/19/2022)  Physical Activity: Inactive (12/19/2022)  Social Connections: Moderately Integrated (12/19/2022)  Stress: Stress Concern Present (12/19/2022)  Tobacco Use: High Risk (04/12/2023)    Readmission Risk Interventions    02/02/2022    1:58 PM  Readmission Risk Prevention Plan  Post Dischage Appt Complete  Medication Screening Complete  Transportation Screening Complete

## 2023-04-16 NOTE — Progress Notes (Signed)
Progress Note    Tammie Martin   ZOX:[id number]  DOB: 03/25/1951  DOA: 04/09/2023     6 PCP: Raliegh Ip, DO  Initial CC: Failure to thrive  Hospital Course: [name] is a 72 yo female with PMH  COPD with chronic hypoxic respiratory failure, severe malnutrition, failure to thrive on home hospice care, AS s/p TAVR, hypothyroidism, depression who presented to the ED on 04/09/2023 due to family's inability to care for her. She was afebrile with hypercalcemia, hypokalemia (K 2.3), hypoalbuminemia, TSH elevation (11.180), and anemia (hgb 8.3). She was given IV fluids and admitted for failure to thrive with plans for placement. Bisphosphonate and calcitonin added 8/13.   Patient is clearly unable to manage her own affairs or to make her own medical decisions. She requires a guardian for this reason.   Interval History:  No events overnight. Was to d/c today but PASSR expired. Foley in place with yellow urine noted.   She seems more uncomfortable today and was moaning out in pain.  Going to start on scheduled pain regimen at this time.  Assessment and Plan:  Failure to thrive: Pt has been on home hospice for FTT, now unable to be cared for adequately in home environment.  - TOC consulted, anticipate SNF disposition. Her prognosis is likely > 2 weeks. Palliative care also consulted. -Patient accepted at [facility] and is stable for d/c; now awaiting new PASSR - anticipate ongoing decline, especially with poor intake   Urinary retention - increased PVR multiple times along with purulent drainage at times from vaginal orifice. Concern for possible underlying fistula; patient would not be a good candidate for any surgical intervention and supportive care is best option for patient - continue foley in place   Hypokalemia: Due to poor po intake - replete as needed   Hypercalcemia - resolved  - Albumin and T protein both low. CXR without mass/bone lesions.  - Initially  suspected to be asymptomatic and due purely to dehydration though her mentation remains grossly abnormal (came in because her care needs became more intensive) and corrected Ca remained elevated - has responded well to bisphos and calcitonin - no further correction at this time    Chronic atrial fibrillation:  - she no longer takes eliquis  - Continue beta-blocker     COPD; chronic hypoxic respiratory failure: Appears to be at baseline.  - Continue supplemental O2, ICS-LAMA-LABA, and as-needed albuterol     Seizure disorder:  - Continue keppra    Hypothyroidism with elevated TSH: Suspect she's not been taking/given her synthroid.  - Restarted synthroid at low dose, .     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  - Supplement vitamins empirically.    Depression  - Continue SNRI    Old records reviewed in assessment of this patient  Antimicrobials:   DVT prophylaxis:  SCDs Start: 04/09/23 2034   Code Status:   Code Status: DNR  Mobility Assessment (Last 72 Hours)     Mobility Assessment     Row Name 04/16/23 0914 04/15/23 2327 04/15/23 0845 04/14/23 1954 04/14/23 0913   Does patient have an order for bedrest or is patient medically unstable Yes- Bedfast (Level 1) - Complete Yes- Bedfast (Level 1) - Complete Yes- Bedfast (Level 1) - Complete Yes- Bedfast (Level 1) - Complete No - Continue assessment   What is the highest level of mobility based on the progressive mobility assessment? -- Level 1 (Bedfast) - Unable to  balance while sitting on edge of bed -- Level 1 (Bedfast) - Unable to balance while sitting on edge of bed Level 2 (Chairfast) - Balance while sitting on edge of bed and cannot stand   Is the above level different from baseline mobility prior to current illness? -- No - Consider discontinuing PT/OT -- Yes - Recommend PT order Yes - Recommend PT order    Row Name 04/13/23 2200           Does patient have an order for  bedrest or is patient medically unstable No - Continue assessment       What is the highest level of mobility based on the progressive mobility assessment? Level 2 (Chairfast) - Balance while sitting on edge of bed and cannot stand       Is the above level different from baseline mobility prior to current illness? Yes - Recommend PT order               Mobility Assessment (Last 72 Hours)     Mobility Assessment     Row Name 04/16/23 0914 04/15/23 2327 04/15/23 0845 04/14/23 1954 04/14/23 0913   Does patient have an order for bedrest or is patient medically unstable Yes- Bedfast (Level 1) - Complete Yes- Bedfast (Level 1) - Complete Yes- Bedfast (Level 1) - Complete Yes- Bedfast (Level 1) - Complete No - Continue assessment   What is the highest level of mobility based on the progressive mobility assessment? -- Level 1 (Bedfast) - Unable to balance while sitting on edge of bed -- Level 1 (Bedfast) - Unable to balance while sitting on edge of bed Level 2 (Chairfast) - Balance while sitting on edge of bed and cannot stand   Is the above level different from baseline mobility prior to current illness? -- No - Consider discontinuing PT/OT -- Yes - Recommend PT order Yes - Recommend PT order    Row Name 04/13/23 2200           Does patient have an order for bedrest or is patient medically unstable No - Continue assessment       What is the highest level of mobility based on the progressive mobility assessment? Level 2 (Chairfast) - Balance while sitting on edge of bed and cannot stand       Is the above level different from baseline mobility prior to current illness? Yes - Recommend PT order               Mobility Assessment (Last 72 Hours)     Mobility Assessment     Row Name 04/16/23 0914 04/15/23 2327 04/15/23 0845 04/14/23 1954 04/14/23 0913   Does patient have an order for bedrest or is patient medically unstable Yes- Bedfast (Level 1) - Complete Yes- Bedfast (Level 1) - Complete  Yes- Bedfast (Level 1) - Complete Yes- Bedfast (Level 1) - Complete No - Continue assessment   What is the highest level of mobility based on the progressive mobility assessment? -- Level 1 (Bedfast) - Unable to balance while sitting on edge of bed -- Level 1 (Bedfast) - Unable to balance while sitting on edge of bed Level 2 (Chairfast) - Balance while sitting on edge of bed and cannot stand   Is the above level different from baseline mobility prior to current illness? -- No - Consider discontinuing PT/OT -- Yes - Recommend PT order Yes - Recommend PT order    Row Name 04/13/23 2200  Does patient have an order for bedrest or is patient medically unstable No - Continue assessment       What is the highest level of mobility based on the progressive mobility assessment? Level 2 (Chairfast) - Balance while sitting on edge of bed and cannot stand       Is the above level different from baseline mobility prior to current illness? Yes - Recommend PT order                Barriers to discharge: none Disposition Plan:  [facility] Status is: Inpt  Objective: Blood pressure 94/61, pulse 95, temperature 98.2 F (36.8 C), temperature source Oral, resp. rate 16, height 5' 2.01" (1.575 m), weight 47.2 kg, SpO2 95%.  Examination:  Physical Exam Constitutional:      Comments: Weak and frail; NAD  HENT:     Head: Normocephalic and atraumatic.     Mouth/Throat:     Mouth: Mucous membranes are dry.  Eyes:     Extraocular Movements: Extraocular movements intact.  Cardiovascular:     Rate and Rhythm: Normal rate and regular rhythm.  Pulmonary:     Effort: Pulmonary effort is normal.     Breath sounds: Normal breath sounds.  Abdominal:     General: Bowel sounds are normal. There is no distension.     Palpations: Abdomen is soft.  Musculoskeletal:        General: No swelling.     Cervical back: Normal range of motion and neck supple.  Skin:    General: Skin is warm and dry.   Neurological:     Mental Status: She is disoriented.      Consultants:    Procedures:    Data Reviewed: No results found for this or any previous visit (from the past 24 hour(s)).   I have reviewed pertinent nursing notes, vitals, labs, and images as necessary. I have ordered labwork to follow up on as indicated.  I have reviewed the last notes from staff over past 24 hours. I have discussed patient's care plan and test results with nursing staff, CM/SW, and other staff as appropriate.   LOS: 6 days   Lewie Chamber, MD [facility] 04/16/2023, 2:48 PM

## 2023-04-17 DIAGNOSIS — Z7189 Other specified counseling: Secondary | ICD-10-CM

## 2023-04-17 DIAGNOSIS — R338 Other retention of urine: Secondary | ICD-10-CM | POA: Diagnosis not present

## 2023-04-17 DIAGNOSIS — Z515 Encounter for palliative care: Secondary | ICD-10-CM | POA: Diagnosis not present

## 2023-04-17 DIAGNOSIS — R627 Adult failure to thrive: Secondary | ICD-10-CM | POA: Diagnosis not present

## 2023-04-17 NOTE — Progress Notes (Signed)
Palliative: Tammie Martin is lying quietly in bed.  She appears acutely/chronically ill and very frail.  She is resting comfortably, but wakes easily when I call her name.  She will make an somewhat keep eye contact.  I do not ask orientation questions today.  I offer something to drink.  She is able to take a few sips of soda without overt signs of aspiration.  Her mouth is very dry.  I do believe that she can make her basic needs known.  There is no family at bedside at this time.  Tammie Martin had been active with [facility] for at home hospice care.  Her legal guardian/granddaughter, Tammie Martin has elected long-term care placement.  They are considering continuing with hospice care.  Conference with attending, bedside nursing staff, transition of care team related to patient condition, needs, goals of care, disposition.  Plan: [facility] at [facility].  Anticipate resumption of hospice care services, provider choice and determined. Prognosis: Weeks to months anticipated based on chronic illness burden, poor by mouth intake. DNR/goldenrod form on chart.  35 minutes Tammie Carmel, NP Palliative medicine team Team phone [phone] Greater than 50% of this time was spent counseling and coordinating care related to the above assessment and plan.

## 2023-04-17 NOTE — Progress Notes (Signed)
Transition of Care (TOC) -30 day Note         Transition of Care [facility]) CM/SW Contact  Name: Elliot Gault Phone Number: [phone]   MUST ID: [id number]   To Whom it May Concern:   Please be advised that the above patient will require a short-term nursing home stay, anticipated 30 days or less rehabilitation and strengthening. The plan is for return home.

## 2023-04-17 NOTE — TOC Transition Note (Signed)
Transition of Care [facility]) - CM/SW Discharge Note   Patient Details  Name: Tammie Martin MRN: [id number] Date of Birth: 18-Feb-1951  Transition of Care [facility]) CM/SW Contact:  Elliot Gault, LCSW Phone Number: 04/17/2023, 12:46 PM   Clinical Narrative:     Pt medically stable for dc and PASRR number assigned. Debbie at [facility] states that they can admit pt today.  Updated pt's granddaughter, [name], who remains in agreement with dc plan. [name] has guardianship hearing for pt on 05/08/23. She is continuing to work on Personal assistant.  Discussed referral for Palliative care at [facility] with [facility] and [name] is in agreement. She is aware that [facility] can follow for hospice care there once medicaid approved.  DC clinical sent electronically. RN to call report. EMS arranged.  No other TOC needs for dc.  Final next level of care: Long Term Nursing Home Barriers to Discharge: Barriers Resolved   Patient Goals and CMS Choice CMS Medicare.gov Compare Post Acute Care list provided to:: Patient Represenative (must comment) Choice offered to / list presented to : Adult Children  Discharge Placement                Patient chooses bed at: Other - please specify in the comment section below: [facility]) Patient to be transferred to facility by: EMS Name of family member notified: [name] Patient and family notified of of transfer: 04/17/23  Discharge Plan and Services Additional resources added to the After Visit Summary for   In-house Referral: Clinical Social Work   Post Acute Care Choice: Skilled Nursing Facility                               Social Determinants of Health (SDOH) Interventions SDOH Screenings   Food Insecurity: Patient Unable To Answer (04/09/2023)  Housing: Patient Unable To Answer (04/09/2023)  Transportation Needs: Patient Unable To Answer (04/09/2023)  Utilities: Patient Unable To Answer (04/09/2023)  Alcohol Screen:  Low Risk  (12/19/2022)  Depression (PHQ2-9): High Risk (12/19/2022)  Financial Resource Strain: Low Risk  (12/19/2022)  Physical Activity: Inactive (12/19/2022)  Social Connections: Moderately Integrated (12/19/2022)  Stress: Stress Concern Present (12/19/2022)  Tobacco Use: High Risk (04/12/2023)     Readmission Risk Interventions    02/02/2022    1:58 PM  Readmission Risk Prevention Plan  Post Dischage Appt Complete  Medication Screening Complete  Transportation Screening Complete

## 2023-04-18 DIAGNOSIS — R293 Abnormal posture: Secondary | ICD-10-CM | POA: Diagnosis not present

## 2023-04-18 DIAGNOSIS — R278 Other lack of coordination: Secondary | ICD-10-CM | POA: Diagnosis not present

## 2023-04-18 DIAGNOSIS — G40909 Epilepsy, unspecified, not intractable, without status epilepticus: Secondary | ICD-10-CM | POA: Diagnosis not present

## 2023-04-18 DIAGNOSIS — R627 Adult failure to thrive: Secondary | ICD-10-CM | POA: Diagnosis not present

## 2023-04-18 DIAGNOSIS — M6281 Muscle weakness (generalized): Secondary | ICD-10-CM | POA: Diagnosis not present

## 2023-04-18 DIAGNOSIS — M62462 Contracture of muscle, left lower leg: Secondary | ICD-10-CM | POA: Diagnosis not present

## 2023-04-18 DIAGNOSIS — E43 Unspecified severe protein-calorie malnutrition: Secondary | ICD-10-CM | POA: Diagnosis not present

## 2023-04-18 DIAGNOSIS — Z741 Need for assistance with personal care: Secondary | ICD-10-CM | POA: Diagnosis not present

## 2023-04-18 DIAGNOSIS — J449 Chronic obstructive pulmonary disease, unspecified: Secondary | ICD-10-CM | POA: Diagnosis not present

## 2023-04-19 ENCOUNTER — Telehealth: Payer: Self-pay | Admitting: Family Medicine

## 2023-04-19 DIAGNOSIS — J449 Chronic obstructive pulmonary disease, unspecified: Secondary | ICD-10-CM | POA: Diagnosis not present

## 2023-04-19 DIAGNOSIS — R627 Adult failure to thrive: Secondary | ICD-10-CM | POA: Diagnosis not present

## 2023-04-19 DIAGNOSIS — R339 Retention of urine, unspecified: Secondary | ICD-10-CM | POA: Diagnosis not present

## 2023-04-19 DIAGNOSIS — E46 Unspecified protein-calorie malnutrition: Secondary | ICD-10-CM | POA: Diagnosis not present

## 2023-04-19 DIAGNOSIS — I4891 Unspecified atrial fibrillation: Secondary | ICD-10-CM | POA: Diagnosis not present

## 2023-04-19 DIAGNOSIS — J9610 Chronic respiratory failure, unspecified whether with hypoxia or hypercapnia: Secondary | ICD-10-CM | POA: Diagnosis not present

## 2023-04-19 DIAGNOSIS — N139 Obstructive and reflux uropathy, unspecified: Secondary | ICD-10-CM | POA: Diagnosis not present

## 2023-04-19 DIAGNOSIS — G40909 Epilepsy, unspecified, not intractable, without status epilepticus: Secondary | ICD-10-CM | POA: Diagnosis not present

## 2023-04-19 NOTE — Telephone Encounter (Signed)
Attempted to call pt no answer left vm  

## 2023-04-19 NOTE — Telephone Encounter (Signed)
Please set pt up for a 30 min appt.  Will need MMSE during that visit. I have not seen her in person since 10/2022.  Additionally, as I'm sure Grover Canavan is well aware, competence is determined in a courtroom not a medical office.  I can certainly evaluate her for capacity but this requires her actually be seen.

## 2023-04-20 DIAGNOSIS — R627 Adult failure to thrive: Secondary | ICD-10-CM | POA: Diagnosis not present

## 2023-04-20 DIAGNOSIS — J9610 Chronic respiratory failure, unspecified whether with hypoxia or hypercapnia: Secondary | ICD-10-CM | POA: Diagnosis not present

## 2023-04-20 DIAGNOSIS — G40909 Epilepsy, unspecified, not intractable, without status epilepticus: Secondary | ICD-10-CM | POA: Diagnosis not present

## 2023-04-20 DIAGNOSIS — R278 Other lack of coordination: Secondary | ICD-10-CM | POA: Diagnosis not present

## 2023-04-20 DIAGNOSIS — M6281 Muscle weakness (generalized): Secondary | ICD-10-CM | POA: Diagnosis not present

## 2023-04-20 DIAGNOSIS — I4891 Unspecified atrial fibrillation: Secondary | ICD-10-CM | POA: Diagnosis not present

## 2023-04-20 DIAGNOSIS — E46 Unspecified protein-calorie malnutrition: Secondary | ICD-10-CM | POA: Diagnosis not present

## 2023-04-20 DIAGNOSIS — E039 Hypothyroidism, unspecified: Secondary | ICD-10-CM | POA: Diagnosis not present

## 2023-04-20 DIAGNOSIS — N139 Obstructive and reflux uropathy, unspecified: Secondary | ICD-10-CM | POA: Diagnosis not present

## 2023-04-20 DIAGNOSIS — R293 Abnormal posture: Secondary | ICD-10-CM | POA: Diagnosis not present

## 2023-04-20 DIAGNOSIS — J449 Chronic obstructive pulmonary disease, unspecified: Secondary | ICD-10-CM | POA: Diagnosis not present

## 2023-04-20 DIAGNOSIS — E43 Unspecified severe protein-calorie malnutrition: Secondary | ICD-10-CM | POA: Diagnosis not present

## 2023-04-20 DIAGNOSIS — Z741 Need for assistance with personal care: Secondary | ICD-10-CM | POA: Diagnosis not present

## 2023-04-20 DIAGNOSIS — M62462 Contracture of muscle, left lower leg: Secondary | ICD-10-CM | POA: Diagnosis not present

## 2023-04-21 ENCOUNTER — Telehealth: Payer: Self-pay | Admitting: Family Medicine

## 2023-04-21 DIAGNOSIS — R278 Other lack of coordination: Secondary | ICD-10-CM | POA: Diagnosis not present

## 2023-04-21 DIAGNOSIS — E43 Unspecified severe protein-calorie malnutrition: Secondary | ICD-10-CM | POA: Diagnosis not present

## 2023-04-21 DIAGNOSIS — M6281 Muscle weakness (generalized): Secondary | ICD-10-CM | POA: Diagnosis not present

## 2023-04-21 DIAGNOSIS — G40909 Epilepsy, unspecified, not intractable, without status epilepticus: Secondary | ICD-10-CM | POA: Diagnosis not present

## 2023-04-21 DIAGNOSIS — Z741 Need for assistance with personal care: Secondary | ICD-10-CM | POA: Diagnosis not present

## 2023-04-21 DIAGNOSIS — J449 Chronic obstructive pulmonary disease, unspecified: Secondary | ICD-10-CM | POA: Diagnosis not present

## 2023-04-21 DIAGNOSIS — R627 Adult failure to thrive: Secondary | ICD-10-CM | POA: Diagnosis not present

## 2023-04-21 DIAGNOSIS — M62462 Contracture of muscle, left lower leg: Secondary | ICD-10-CM | POA: Diagnosis not present

## 2023-04-21 DIAGNOSIS — R293 Abnormal posture: Secondary | ICD-10-CM | POA: Diagnosis not present

## 2023-04-22 DIAGNOSIS — M6281 Muscle weakness (generalized): Secondary | ICD-10-CM | POA: Diagnosis not present

## 2023-04-22 DIAGNOSIS — M62462 Contracture of muscle, left lower leg: Secondary | ICD-10-CM | POA: Diagnosis not present

## 2023-04-22 DIAGNOSIS — G40909 Epilepsy, unspecified, not intractable, without status epilepticus: Secondary | ICD-10-CM | POA: Diagnosis not present

## 2023-04-22 DIAGNOSIS — Z741 Need for assistance with personal care: Secondary | ICD-10-CM | POA: Diagnosis not present

## 2023-04-22 DIAGNOSIS — R627 Adult failure to thrive: Secondary | ICD-10-CM | POA: Diagnosis not present

## 2023-04-22 DIAGNOSIS — R293 Abnormal posture: Secondary | ICD-10-CM | POA: Diagnosis not present

## 2023-04-22 DIAGNOSIS — R278 Other lack of coordination: Secondary | ICD-10-CM | POA: Diagnosis not present

## 2023-04-22 DIAGNOSIS — E43 Unspecified severe protein-calorie malnutrition: Secondary | ICD-10-CM | POA: Diagnosis not present

## 2023-04-22 DIAGNOSIS — J449 Chronic obstructive pulmonary disease, unspecified: Secondary | ICD-10-CM | POA: Diagnosis not present

## 2023-04-24 DIAGNOSIS — J9610 Chronic respiratory failure, unspecified whether with hypoxia or hypercapnia: Secondary | ICD-10-CM | POA: Diagnosis not present

## 2023-04-24 DIAGNOSIS — R627 Adult failure to thrive: Secondary | ICD-10-CM | POA: Diagnosis not present

## 2023-04-24 DIAGNOSIS — I4891 Unspecified atrial fibrillation: Secondary | ICD-10-CM | POA: Diagnosis not present

## 2023-04-24 DIAGNOSIS — R293 Abnormal posture: Secondary | ICD-10-CM | POA: Diagnosis not present

## 2023-04-24 DIAGNOSIS — R278 Other lack of coordination: Secondary | ICD-10-CM | POA: Diagnosis not present

## 2023-04-24 DIAGNOSIS — E46 Unspecified protein-calorie malnutrition: Secondary | ICD-10-CM | POA: Diagnosis not present

## 2023-04-24 DIAGNOSIS — M6281 Muscle weakness (generalized): Secondary | ICD-10-CM | POA: Diagnosis not present

## 2023-04-24 DIAGNOSIS — M62462 Contracture of muscle, left lower leg: Secondary | ICD-10-CM | POA: Diagnosis not present

## 2023-04-24 DIAGNOSIS — Z741 Need for assistance with personal care: Secondary | ICD-10-CM | POA: Diagnosis not present

## 2023-04-24 DIAGNOSIS — N139 Obstructive and reflux uropathy, unspecified: Secondary | ICD-10-CM | POA: Diagnosis not present

## 2023-04-24 DIAGNOSIS — E43 Unspecified severe protein-calorie malnutrition: Secondary | ICD-10-CM | POA: Diagnosis not present

## 2023-04-24 DIAGNOSIS — G40909 Epilepsy, unspecified, not intractable, without status epilepticus: Secondary | ICD-10-CM | POA: Diagnosis not present

## 2023-04-24 DIAGNOSIS — J449 Chronic obstructive pulmonary disease, unspecified: Secondary | ICD-10-CM | POA: Diagnosis not present

## 2023-04-24 DIAGNOSIS — E039 Hypothyroidism, unspecified: Secondary | ICD-10-CM | POA: Diagnosis not present

## 2023-04-25 DIAGNOSIS — Z741 Need for assistance with personal care: Secondary | ICD-10-CM | POA: Diagnosis not present

## 2023-04-25 DIAGNOSIS — N390 Urinary tract infection, site not specified: Secondary | ICD-10-CM | POA: Diagnosis not present

## 2023-04-25 DIAGNOSIS — E43 Unspecified severe protein-calorie malnutrition: Secondary | ICD-10-CM | POA: Diagnosis not present

## 2023-04-25 DIAGNOSIS — J449 Chronic obstructive pulmonary disease, unspecified: Secondary | ICD-10-CM | POA: Diagnosis not present

## 2023-04-25 DIAGNOSIS — M62462 Contracture of muscle, left lower leg: Secondary | ICD-10-CM | POA: Diagnosis not present

## 2023-04-25 DIAGNOSIS — M6281 Muscle weakness (generalized): Secondary | ICD-10-CM | POA: Diagnosis not present

## 2023-04-25 DIAGNOSIS — R627 Adult failure to thrive: Secondary | ICD-10-CM | POA: Diagnosis not present

## 2023-04-25 DIAGNOSIS — G40909 Epilepsy, unspecified, not intractable, without status epilepticus: Secondary | ICD-10-CM | POA: Diagnosis not present

## 2023-04-25 DIAGNOSIS — R293 Abnormal posture: Secondary | ICD-10-CM | POA: Diagnosis not present

## 2023-04-25 DIAGNOSIS — R278 Other lack of coordination: Secondary | ICD-10-CM | POA: Diagnosis not present

## 2023-04-26 DIAGNOSIS — M62462 Contracture of muscle, left lower leg: Secondary | ICD-10-CM | POA: Diagnosis not present

## 2023-04-26 DIAGNOSIS — M6281 Muscle weakness (generalized): Secondary | ICD-10-CM | POA: Diagnosis not present

## 2023-04-26 DIAGNOSIS — J449 Chronic obstructive pulmonary disease, unspecified: Secondary | ICD-10-CM | POA: Diagnosis not present

## 2023-04-26 DIAGNOSIS — R278 Other lack of coordination: Secondary | ICD-10-CM | POA: Diagnosis not present

## 2023-04-26 DIAGNOSIS — J9610 Chronic respiratory failure, unspecified whether with hypoxia or hypercapnia: Secondary | ICD-10-CM | POA: Diagnosis not present

## 2023-04-26 DIAGNOSIS — N139 Obstructive and reflux uropathy, unspecified: Secondary | ICD-10-CM | POA: Diagnosis not present

## 2023-04-26 DIAGNOSIS — R293 Abnormal posture: Secondary | ICD-10-CM | POA: Diagnosis not present

## 2023-04-26 DIAGNOSIS — I4891 Unspecified atrial fibrillation: Secondary | ICD-10-CM | POA: Diagnosis not present

## 2023-04-26 DIAGNOSIS — N390 Urinary tract infection, site not specified: Secondary | ICD-10-CM | POA: Diagnosis not present

## 2023-04-26 DIAGNOSIS — Z741 Need for assistance with personal care: Secondary | ICD-10-CM | POA: Diagnosis not present

## 2023-04-26 DIAGNOSIS — G40909 Epilepsy, unspecified, not intractable, without status epilepticus: Secondary | ICD-10-CM | POA: Diagnosis not present

## 2023-04-26 DIAGNOSIS — R627 Adult failure to thrive: Secondary | ICD-10-CM | POA: Diagnosis not present

## 2023-04-26 DIAGNOSIS — E43 Unspecified severe protein-calorie malnutrition: Secondary | ICD-10-CM | POA: Diagnosis not present

## 2023-04-26 DIAGNOSIS — E46 Unspecified protein-calorie malnutrition: Secondary | ICD-10-CM | POA: Diagnosis not present

## 2023-04-27 DIAGNOSIS — R627 Adult failure to thrive: Secondary | ICD-10-CM | POA: Diagnosis not present

## 2023-04-27 DIAGNOSIS — M6281 Muscle weakness (generalized): Secondary | ICD-10-CM | POA: Diagnosis not present

## 2023-04-27 DIAGNOSIS — R293 Abnormal posture: Secondary | ICD-10-CM | POA: Diagnosis not present

## 2023-04-27 DIAGNOSIS — J449 Chronic obstructive pulmonary disease, unspecified: Secondary | ICD-10-CM | POA: Diagnosis not present

## 2023-04-27 DIAGNOSIS — M62462 Contracture of muscle, left lower leg: Secondary | ICD-10-CM | POA: Diagnosis not present

## 2023-04-27 DIAGNOSIS — R278 Other lack of coordination: Secondary | ICD-10-CM | POA: Diagnosis not present

## 2023-04-27 DIAGNOSIS — Z741 Need for assistance with personal care: Secondary | ICD-10-CM | POA: Diagnosis not present

## 2023-04-27 DIAGNOSIS — G40909 Epilepsy, unspecified, not intractable, without status epilepticus: Secondary | ICD-10-CM | POA: Diagnosis not present

## 2023-04-27 DIAGNOSIS — E43 Unspecified severe protein-calorie malnutrition: Secondary | ICD-10-CM | POA: Diagnosis not present

## 2023-04-28 DIAGNOSIS — M62462 Contracture of muscle, left lower leg: Secondary | ICD-10-CM | POA: Diagnosis not present

## 2023-04-28 DIAGNOSIS — J449 Chronic obstructive pulmonary disease, unspecified: Secondary | ICD-10-CM | POA: Diagnosis not present

## 2023-04-28 DIAGNOSIS — N390 Urinary tract infection, site not specified: Secondary | ICD-10-CM | POA: Diagnosis not present

## 2023-04-28 DIAGNOSIS — Z91128 Patient's intentional underdosing of medication regimen for other reason: Secondary | ICD-10-CM | POA: Diagnosis not present

## 2023-04-28 DIAGNOSIS — J9610 Chronic respiratory failure, unspecified whether with hypoxia or hypercapnia: Secondary | ICD-10-CM | POA: Diagnosis not present

## 2023-04-28 DIAGNOSIS — N139 Obstructive and reflux uropathy, unspecified: Secondary | ICD-10-CM | POA: Diagnosis not present

## 2023-04-28 DIAGNOSIS — R627 Adult failure to thrive: Secondary | ICD-10-CM | POA: Diagnosis not present

## 2023-04-28 DIAGNOSIS — I4891 Unspecified atrial fibrillation: Secondary | ICD-10-CM | POA: Diagnosis not present

## 2023-04-28 DIAGNOSIS — Z515 Encounter for palliative care: Secondary | ICD-10-CM | POA: Diagnosis not present

## 2023-04-28 DIAGNOSIS — G40909 Epilepsy, unspecified, not intractable, without status epilepticus: Secondary | ICD-10-CM | POA: Diagnosis not present

## 2023-04-28 DIAGNOSIS — E46 Unspecified protein-calorie malnutrition: Secondary | ICD-10-CM | POA: Diagnosis not present

## 2023-04-28 DIAGNOSIS — E43 Unspecified severe protein-calorie malnutrition: Secondary | ICD-10-CM | POA: Diagnosis not present

## 2023-04-28 DIAGNOSIS — R278 Other lack of coordination: Secondary | ICD-10-CM | POA: Diagnosis not present

## 2023-04-28 DIAGNOSIS — M6281 Muscle weakness (generalized): Secondary | ICD-10-CM | POA: Diagnosis not present

## 2023-04-28 DIAGNOSIS — F32A Depression, unspecified: Secondary | ICD-10-CM | POA: Diagnosis not present

## 2023-04-28 DIAGNOSIS — R293 Abnormal posture: Secondary | ICD-10-CM | POA: Diagnosis not present

## 2023-04-28 DIAGNOSIS — F445 Conversion disorder with seizures or convulsions: Secondary | ICD-10-CM | POA: Diagnosis not present

## 2023-04-28 DIAGNOSIS — Z741 Need for assistance with personal care: Secondary | ICD-10-CM | POA: Diagnosis not present

## 2023-05-01 DIAGNOSIS — R627 Adult failure to thrive: Secondary | ICD-10-CM | POA: Diagnosis not present

## 2023-05-01 DIAGNOSIS — J9610 Chronic respiratory failure, unspecified whether with hypoxia or hypercapnia: Secondary | ICD-10-CM | POA: Diagnosis not present

## 2023-05-01 DIAGNOSIS — J449 Chronic obstructive pulmonary disease, unspecified: Secondary | ICD-10-CM | POA: Diagnosis not present

## 2023-05-01 DIAGNOSIS — G40909 Epilepsy, unspecified, not intractable, without status epilepticus: Secondary | ICD-10-CM | POA: Diagnosis not present

## 2023-05-01 DIAGNOSIS — N139 Obstructive and reflux uropathy, unspecified: Secondary | ICD-10-CM | POA: Diagnosis not present

## 2023-05-01 DIAGNOSIS — N390 Urinary tract infection, site not specified: Secondary | ICD-10-CM | POA: Diagnosis not present

## 2023-05-01 DIAGNOSIS — I4891 Unspecified atrial fibrillation: Secondary | ICD-10-CM | POA: Diagnosis not present

## 2023-05-01 DIAGNOSIS — E46 Unspecified protein-calorie malnutrition: Secondary | ICD-10-CM | POA: Diagnosis not present

## 2023-05-02 ENCOUNTER — Telehealth: Payer: Self-pay | Admitting: Family Medicine

## 2023-05-02 NOTE — Telephone Encounter (Signed)
I made a whole new letter.

## 2023-05-02 NOTE — Telephone Encounter (Signed)
Apparently patient is at a SNF and there are plans to transition to hospice care services at some point.  Applying for medicaid and is suffering from failure to thrive.  Has been evaluated by the hospitalist and medically does not have capacity for medical decision making at this time.  Please see Dr Frederick Peers' note from  04/17/2023.

## 2023-05-02 NOTE — Telephone Encounter (Signed)
Letter written and given to family member.

## 2023-05-02 NOTE — Telephone Encounter (Signed)
Pts granddaughter called stating that she spoke with Grover Canavan from Social Services who told her that the letter needed to be dated 04/11/2023.  Please fax directly to Jenell Milliner at [phone]

## 2023-05-02 NOTE — Telephone Encounter (Signed)
Letter faxed.

## 2023-05-03 DIAGNOSIS — J449 Chronic obstructive pulmonary disease, unspecified: Secondary | ICD-10-CM | POA: Diagnosis not present

## 2023-05-03 DIAGNOSIS — N390 Urinary tract infection, site not specified: Secondary | ICD-10-CM | POA: Diagnosis not present

## 2023-05-03 DIAGNOSIS — E46 Unspecified protein-calorie malnutrition: Secondary | ICD-10-CM | POA: Diagnosis not present

## 2023-05-03 DIAGNOSIS — I4891 Unspecified atrial fibrillation: Secondary | ICD-10-CM | POA: Diagnosis not present

## 2023-05-03 DIAGNOSIS — J9610 Chronic respiratory failure, unspecified whether with hypoxia or hypercapnia: Secondary | ICD-10-CM | POA: Diagnosis not present

## 2023-05-03 DIAGNOSIS — M25462 Effusion, left knee: Secondary | ICD-10-CM | POA: Diagnosis not present

## 2023-05-03 DIAGNOSIS — M25562 Pain in left knee: Secondary | ICD-10-CM | POA: Diagnosis not present

## 2023-05-03 DIAGNOSIS — R627 Adult failure to thrive: Secondary | ICD-10-CM | POA: Diagnosis not present

## 2023-05-03 DIAGNOSIS — N139 Obstructive and reflux uropathy, unspecified: Secondary | ICD-10-CM | POA: Diagnosis not present

## 2023-05-04 NOTE — Telephone Encounter (Signed)
NA °No call back  °This encounter will be closed  °

## 2023-05-05 DIAGNOSIS — F32A Depression, unspecified: Secondary | ICD-10-CM | POA: Diagnosis not present

## 2023-05-05 DIAGNOSIS — I4891 Unspecified atrial fibrillation: Secondary | ICD-10-CM | POA: Diagnosis not present

## 2023-05-05 DIAGNOSIS — J449 Chronic obstructive pulmonary disease, unspecified: Secondary | ICD-10-CM | POA: Diagnosis not present

## 2023-05-05 DIAGNOSIS — M25462 Effusion, left knee: Secondary | ICD-10-CM | POA: Diagnosis not present

## 2023-05-05 DIAGNOSIS — F411 Generalized anxiety disorder: Secondary | ICD-10-CM | POA: Diagnosis not present

## 2023-05-05 DIAGNOSIS — J9610 Chronic respiratory failure, unspecified whether with hypoxia or hypercapnia: Secondary | ICD-10-CM | POA: Diagnosis not present

## 2023-05-05 DIAGNOSIS — E46 Unspecified protein-calorie malnutrition: Secondary | ICD-10-CM | POA: Diagnosis not present

## 2023-05-05 DIAGNOSIS — E43 Unspecified severe protein-calorie malnutrition: Secondary | ICD-10-CM | POA: Diagnosis not present

## 2023-05-05 DIAGNOSIS — Z91128 Patient's intentional underdosing of medication regimen for other reason: Secondary | ICD-10-CM | POA: Diagnosis not present

## 2023-05-05 DIAGNOSIS — N139 Obstructive and reflux uropathy, unspecified: Secondary | ICD-10-CM | POA: Diagnosis not present

## 2023-05-05 DIAGNOSIS — N390 Urinary tract infection, site not specified: Secondary | ICD-10-CM | POA: Diagnosis not present

## 2023-05-05 DIAGNOSIS — I959 Hypotension, unspecified: Secondary | ICD-10-CM | POA: Diagnosis not present

## 2023-05-05 DIAGNOSIS — Z515 Encounter for palliative care: Secondary | ICD-10-CM | POA: Diagnosis not present

## 2023-05-05 DIAGNOSIS — F331 Major depressive disorder, recurrent, moderate: Secondary | ICD-10-CM | POA: Diagnosis not present

## 2023-05-05 DIAGNOSIS — R627 Adult failure to thrive: Secondary | ICD-10-CM | POA: Diagnosis not present

## 2023-05-05 DIAGNOSIS — F445 Conversion disorder with seizures or convulsions: Secondary | ICD-10-CM | POA: Diagnosis not present

## 2023-05-08 DIAGNOSIS — I4891 Unspecified atrial fibrillation: Secondary | ICD-10-CM | POA: Diagnosis not present

## 2023-05-08 DIAGNOSIS — N390 Urinary tract infection, site not specified: Secondary | ICD-10-CM | POA: Diagnosis not present

## 2023-05-08 DIAGNOSIS — M25462 Effusion, left knee: Secondary | ICD-10-CM | POA: Diagnosis not present

## 2023-05-08 DIAGNOSIS — R627 Adult failure to thrive: Secondary | ICD-10-CM | POA: Diagnosis not present

## 2023-05-08 DIAGNOSIS — N139 Obstructive and reflux uropathy, unspecified: Secondary | ICD-10-CM | POA: Diagnosis not present

## 2023-05-08 DIAGNOSIS — J449 Chronic obstructive pulmonary disease, unspecified: Secondary | ICD-10-CM | POA: Diagnosis not present

## 2023-05-08 DIAGNOSIS — J9610 Chronic respiratory failure, unspecified whether with hypoxia or hypercapnia: Secondary | ICD-10-CM | POA: Diagnosis not present

## 2023-05-08 DIAGNOSIS — E46 Unspecified protein-calorie malnutrition: Secondary | ICD-10-CM | POA: Diagnosis not present

## 2023-05-09 DIAGNOSIS — F32A Depression, unspecified: Secondary | ICD-10-CM | POA: Diagnosis not present

## 2023-05-09 DIAGNOSIS — I959 Hypotension, unspecified: Secondary | ICD-10-CM | POA: Diagnosis not present

## 2023-05-09 DIAGNOSIS — E43 Unspecified severe protein-calorie malnutrition: Secondary | ICD-10-CM | POA: Diagnosis not present

## 2023-05-09 DIAGNOSIS — F445 Conversion disorder with seizures or convulsions: Secondary | ICD-10-CM | POA: Diagnosis not present

## 2023-05-09 DIAGNOSIS — Z91128 Patient's intentional underdosing of medication regimen for other reason: Secondary | ICD-10-CM | POA: Diagnosis not present

## 2023-05-09 DIAGNOSIS — Z515 Encounter for palliative care: Secondary | ICD-10-CM | POA: Diagnosis not present

## 2023-05-10 DIAGNOSIS — E46 Unspecified protein-calorie malnutrition: Secondary | ICD-10-CM | POA: Diagnosis not present

## 2023-05-10 DIAGNOSIS — N390 Urinary tract infection, site not specified: Secondary | ICD-10-CM | POA: Diagnosis not present

## 2023-05-10 DIAGNOSIS — N139 Obstructive and reflux uropathy, unspecified: Secondary | ICD-10-CM | POA: Diagnosis not present

## 2023-05-10 DIAGNOSIS — J9610 Chronic respiratory failure, unspecified whether with hypoxia or hypercapnia: Secondary | ICD-10-CM | POA: Diagnosis not present

## 2023-05-10 DIAGNOSIS — R627 Adult failure to thrive: Secondary | ICD-10-CM | POA: Diagnosis not present

## 2023-05-10 DIAGNOSIS — M25462 Effusion, left knee: Secondary | ICD-10-CM | POA: Diagnosis not present

## 2023-05-10 DIAGNOSIS — I4891 Unspecified atrial fibrillation: Secondary | ICD-10-CM | POA: Diagnosis not present

## 2023-05-10 DIAGNOSIS — J449 Chronic obstructive pulmonary disease, unspecified: Secondary | ICD-10-CM | POA: Diagnosis not present

## 2023-05-12 DIAGNOSIS — R627 Adult failure to thrive: Secondary | ICD-10-CM | POA: Diagnosis not present

## 2023-05-12 DIAGNOSIS — N390 Urinary tract infection, site not specified: Secondary | ICD-10-CM | POA: Diagnosis not present

## 2023-05-12 DIAGNOSIS — M25462 Effusion, left knee: Secondary | ICD-10-CM | POA: Diagnosis not present

## 2023-05-12 DIAGNOSIS — E46 Unspecified protein-calorie malnutrition: Secondary | ICD-10-CM | POA: Diagnosis not present

## 2023-05-12 DIAGNOSIS — J449 Chronic obstructive pulmonary disease, unspecified: Secondary | ICD-10-CM | POA: Diagnosis not present

## 2023-05-12 DIAGNOSIS — J9610 Chronic respiratory failure, unspecified whether with hypoxia or hypercapnia: Secondary | ICD-10-CM | POA: Diagnosis not present

## 2023-05-12 DIAGNOSIS — I4891 Unspecified atrial fibrillation: Secondary | ICD-10-CM | POA: Diagnosis not present

## 2023-05-12 DIAGNOSIS — N139 Obstructive and reflux uropathy, unspecified: Secondary | ICD-10-CM | POA: Diagnosis not present

## 2023-05-15 DIAGNOSIS — J9610 Chronic respiratory failure, unspecified whether with hypoxia or hypercapnia: Secondary | ICD-10-CM | POA: Diagnosis not present

## 2023-05-15 DIAGNOSIS — N139 Obstructive and reflux uropathy, unspecified: Secondary | ICD-10-CM | POA: Diagnosis not present

## 2023-05-15 DIAGNOSIS — R627 Adult failure to thrive: Secondary | ICD-10-CM | POA: Diagnosis not present

## 2023-05-15 DIAGNOSIS — E46 Unspecified protein-calorie malnutrition: Secondary | ICD-10-CM | POA: Diagnosis not present

## 2023-05-15 DIAGNOSIS — M25462 Effusion, left knee: Secondary | ICD-10-CM | POA: Diagnosis not present

## 2023-05-15 DIAGNOSIS — J449 Chronic obstructive pulmonary disease, unspecified: Secondary | ICD-10-CM | POA: Diagnosis not present

## 2023-05-15 DIAGNOSIS — N390 Urinary tract infection, site not specified: Secondary | ICD-10-CM | POA: Diagnosis not present

## 2023-05-15 DIAGNOSIS — I4891 Unspecified atrial fibrillation: Secondary | ICD-10-CM | POA: Diagnosis not present

## 2023-05-17 DIAGNOSIS — N390 Urinary tract infection, site not specified: Secondary | ICD-10-CM | POA: Diagnosis not present

## 2023-05-17 DIAGNOSIS — E46 Unspecified protein-calorie malnutrition: Secondary | ICD-10-CM | POA: Diagnosis not present

## 2023-05-17 DIAGNOSIS — I4891 Unspecified atrial fibrillation: Secondary | ICD-10-CM | POA: Diagnosis not present

## 2023-05-17 DIAGNOSIS — R627 Adult failure to thrive: Secondary | ICD-10-CM | POA: Diagnosis not present

## 2023-05-17 DIAGNOSIS — J9610 Chronic respiratory failure, unspecified whether with hypoxia or hypercapnia: Secondary | ICD-10-CM | POA: Diagnosis not present

## 2023-05-17 DIAGNOSIS — N139 Obstructive and reflux uropathy, unspecified: Secondary | ICD-10-CM | POA: Diagnosis not present

## 2023-05-17 DIAGNOSIS — M25462 Effusion, left knee: Secondary | ICD-10-CM | POA: Diagnosis not present

## 2023-05-17 DIAGNOSIS — J449 Chronic obstructive pulmonary disease, unspecified: Secondary | ICD-10-CM | POA: Diagnosis not present

## 2023-05-22 DIAGNOSIS — R627 Adult failure to thrive: Secondary | ICD-10-CM | POA: Diagnosis not present

## 2023-05-22 DIAGNOSIS — I4891 Unspecified atrial fibrillation: Secondary | ICD-10-CM | POA: Diagnosis not present

## 2023-05-22 DIAGNOSIS — N390 Urinary tract infection, site not specified: Secondary | ICD-10-CM | POA: Diagnosis not present

## 2023-05-22 DIAGNOSIS — G40909 Epilepsy, unspecified, not intractable, without status epilepticus: Secondary | ICD-10-CM | POA: Diagnosis not present

## 2023-05-22 DIAGNOSIS — F329 Major depressive disorder, single episode, unspecified: Secondary | ICD-10-CM | POA: Diagnosis not present

## 2023-05-22 DIAGNOSIS — J449 Chronic obstructive pulmonary disease, unspecified: Secondary | ICD-10-CM | POA: Diagnosis not present

## 2023-05-23 DIAGNOSIS — F32A Depression, unspecified: Secondary | ICD-10-CM | POA: Diagnosis not present

## 2023-05-23 DIAGNOSIS — I959 Hypotension, unspecified: Secondary | ICD-10-CM | POA: Diagnosis not present

## 2023-05-23 DIAGNOSIS — G894 Chronic pain syndrome: Secondary | ICD-10-CM | POA: Diagnosis not present

## 2023-05-23 DIAGNOSIS — Z91128 Patient's intentional underdosing of medication regimen for other reason: Secondary | ICD-10-CM | POA: Diagnosis not present

## 2023-05-23 DIAGNOSIS — F445 Conversion disorder with seizures or convulsions: Secondary | ICD-10-CM | POA: Diagnosis not present

## 2023-05-23 DIAGNOSIS — E43 Unspecified severe protein-calorie malnutrition: Secondary | ICD-10-CM | POA: Diagnosis not present

## 2023-05-23 DIAGNOSIS — Z515 Encounter for palliative care: Secondary | ICD-10-CM | POA: Diagnosis not present

## 2023-05-25 DIAGNOSIS — R627 Adult failure to thrive: Secondary | ICD-10-CM | POA: Diagnosis not present

## 2023-05-25 DIAGNOSIS — R278 Other lack of coordination: Secondary | ICD-10-CM | POA: Diagnosis not present

## 2023-05-25 DIAGNOSIS — J9611 Chronic respiratory failure with hypoxia: Secondary | ICD-10-CM | POA: Diagnosis not present

## 2023-05-25 DIAGNOSIS — Z741 Need for assistance with personal care: Secondary | ICD-10-CM | POA: Diagnosis not present

## 2023-05-25 DIAGNOSIS — N3946 Mixed incontinence: Secondary | ICD-10-CM | POA: Diagnosis not present

## 2023-05-25 DIAGNOSIS — L89110 Pressure ulcer of right upper back, unstageable: Secondary | ICD-10-CM | POA: Diagnosis not present

## 2023-05-25 DIAGNOSIS — I4891 Unspecified atrial fibrillation: Secondary | ICD-10-CM | POA: Diagnosis not present

## 2023-05-25 DIAGNOSIS — L89150 Pressure ulcer of sacral region, unstageable: Secondary | ICD-10-CM | POA: Diagnosis not present

## 2023-05-25 DIAGNOSIS — M6281 Muscle weakness (generalized): Secondary | ICD-10-CM | POA: Diagnosis not present

## 2023-05-25 DIAGNOSIS — E43 Unspecified severe protein-calorie malnutrition: Secondary | ICD-10-CM | POA: Diagnosis not present

## 2023-05-27 DIAGNOSIS — F419 Anxiety disorder, unspecified: Secondary | ICD-10-CM | POA: Diagnosis not present

## 2023-05-27 DIAGNOSIS — F331 Major depressive disorder, recurrent, moderate: Secondary | ICD-10-CM | POA: Diagnosis not present

## 2023-11-16 NOTE — Patient Instructions (Signed)
 Visit Information  Thank you for taking time to visit with me today. Please don't hesitate to contact me if I can be of assistance to you.   Following are the goals we discussed today:   Goals Addressed             This Visit's Progress    [facility] care coordination services       Interventions Today    Flowsheet Row Most Recent Value  Chronic Disease   Chronic disease during today's visit Chronic Obstructive Pulmonary Disease (COPD), Hypertension (HTN), Other  [pain, GI symptoms]  General Interventions   General Interventions Discussed/Reviewed General Interventions Reviewed, Doctor Visits, Community Resources  Doctor Visits Discussed/Reviewed Doctor Visits Reviewed, PCP  PCP/Specialist Visits Compliance with follow-up visit  Education Interventions   Education Provided Provided Education  Provided Verbal Education On [employer], Other, Sick Day Rules  Mental Health Interventions   Mental Health Discussed/Reviewed Mental Health Reviewed, Coping Strategies  Nutrition Interventions   Nutrition Discussed/Reviewed Nutrition Reviewed, Fluid intake  Pharmacy Interventions   Pharmacy Dicussed/Reviewed Pharmacy Topics Reviewed, Medications and their functions, Affording Medications              Our next appointment is  na  on na at na  Please call the care guide team at [phone] if you need to cancel or reschedule your appointment.   If you are experiencing a Mental Health or Behavioral Health Crisis or need someone to talk to, please call the Suicide and Crisis Lifeline: 988 call the [place] National Suicide Prevention Lifeline: 386-104-0339 or TTY: 902-086-2331 TTY 530-854-0609) to talk to a trained counselor call 1-800-273-TALK (toll free, 24 hour hotline) call the [facility]: (760)087-9261 call 911   Patient verbalizes understanding of instructions and care plan provided today and agrees to view in MyChart. Active MyChart status and patient  understanding of how to access instructions and care plan via MyChart confirmed with patient.     The patient has been provided with contact information for the care management team and has been advised to call with any health related questions or concerns.    [name] [name], RN, BSN, CCM [place]  [facility], [facility] RN Care Manager Direct Dial: [phone]  Fax: [phone]
